# Patient Record
Sex: Male | Born: 1955
Health system: Southern US, Community
[De-identification: ages and names within clinical notes are randomized; demographics above are authoritative.]

## PROBLEM LIST (undated history)

## (undated) DIAGNOSIS — Z8719 Personal history of other diseases of the digestive system: Secondary | ICD-10-CM

## (undated) DIAGNOSIS — J449 Chronic obstructive pulmonary disease, unspecified: Secondary | ICD-10-CM

## (undated) DIAGNOSIS — K219 Gastro-esophageal reflux disease without esophagitis: Secondary | ICD-10-CM

## (undated) DIAGNOSIS — R519 Headache, unspecified: Secondary | ICD-10-CM

## (undated) DIAGNOSIS — I10 Essential (primary) hypertension: Secondary | ICD-10-CM

## (undated) DIAGNOSIS — Z86718 Personal history of other venous thrombosis and embolism: Secondary | ICD-10-CM

## (undated) DIAGNOSIS — R609 Edema, unspecified: Secondary | ICD-10-CM

## (undated) DIAGNOSIS — J90 Pleural effusion, not elsewhere classified: Secondary | ICD-10-CM

## (undated) DIAGNOSIS — L281 Prurigo nodularis: Secondary | ICD-10-CM

## (undated) DIAGNOSIS — Z9289 Personal history of other medical treatment: Secondary | ICD-10-CM

## (undated) DIAGNOSIS — M1990 Unspecified osteoarthritis, unspecified site: Secondary | ICD-10-CM

## (undated) DIAGNOSIS — K922 Gastrointestinal hemorrhage, unspecified: Secondary | ICD-10-CM

## (undated) DIAGNOSIS — M2550 Pain in unspecified joint: Secondary | ICD-10-CM

## (undated) DIAGNOSIS — F32A Depression, unspecified: Secondary | ICD-10-CM

## (undated) DIAGNOSIS — E039 Hypothyroidism, unspecified: Secondary | ICD-10-CM

## (undated) DIAGNOSIS — I639 Cerebral infarction, unspecified: Secondary | ICD-10-CM

## (undated) DIAGNOSIS — D649 Anemia, unspecified: Secondary | ICD-10-CM

## (undated) DIAGNOSIS — IMO0001 Reserved for inherently not codable concepts without codable children: Secondary | ICD-10-CM

## (undated) DIAGNOSIS — R350 Frequency of micturition: Secondary | ICD-10-CM

## (undated) DIAGNOSIS — Z8711 Personal history of peptic ulcer disease: Secondary | ICD-10-CM

## (undated) DIAGNOSIS — I341 Nonrheumatic mitral (valve) prolapse: Secondary | ICD-10-CM

## (undated) DIAGNOSIS — E785 Hyperlipidemia, unspecified: Secondary | ICD-10-CM

## (undated) DIAGNOSIS — F329 Major depressive disorder, single episode, unspecified: Secondary | ICD-10-CM

## (undated) DIAGNOSIS — E119 Type 2 diabetes mellitus without complications: Secondary | ICD-10-CM

## (undated) DIAGNOSIS — R42 Dizziness and giddiness: Secondary | ICD-10-CM

## (undated) DIAGNOSIS — R3915 Urgency of urination: Secondary | ICD-10-CM

## (undated) DIAGNOSIS — I6529 Occlusion and stenosis of unspecified carotid artery: Secondary | ICD-10-CM

## (undated) DIAGNOSIS — M62838 Other muscle spasm: Secondary | ICD-10-CM

## (undated) DIAGNOSIS — G4700 Insomnia, unspecified: Secondary | ICD-10-CM

## (undated) DIAGNOSIS — R51 Headache: Secondary | ICD-10-CM

## (undated) DIAGNOSIS — G629 Polyneuropathy, unspecified: Secondary | ICD-10-CM

## (undated) DIAGNOSIS — R600 Localized edema: Secondary | ICD-10-CM

## (undated) DIAGNOSIS — B182 Chronic viral hepatitis C: Secondary | ICD-10-CM

## (undated) HISTORY — DX: Chronic viral hepatitis C: B18.2

## (undated) HISTORY — DX: Major depressive disorder, single episode, unspecified: F32.9

## (undated) HISTORY — DX: Gastro-esophageal reflux disease without esophagitis: K21.9

## (undated) HISTORY — DX: Occlusion and stenosis of unspecified carotid artery: I65.29

## (undated) HISTORY — PX: VASCULAR SURGERY: SHX849

## (undated) HISTORY — PX: OTHER SURGICAL HISTORY: SHX169

## (undated) HISTORY — DX: Type 2 diabetes mellitus without complications: E11.9

## (undated) HISTORY — DX: Chronic obstructive pulmonary disease, unspecified: J44.9

## (undated) HISTORY — DX: Hypothyroidism, unspecified: E03.9

## (undated) HISTORY — DX: Essential (primary) hypertension: I10

## (undated) HISTORY — DX: Depression, unspecified: F32.A

## (undated) HISTORY — DX: Prurigo nodularis: L28.1

## (undated) HISTORY — DX: Hyperlipidemia, unspecified: E78.5

---

## 1998-08-07 HISTORY — PX: OTHER SURGICAL HISTORY: SHX169

## 2006-09-20 ENCOUNTER — Ambulatory Visit (HOSPITAL_COMMUNITY): Admission: RE | Admit: 2006-09-20 | Discharge: 2006-09-20 | Payer: Self-pay | Admitting: Orthopedic Surgery

## 2006-10-02 ENCOUNTER — Ambulatory Visit: Payer: Self-pay | Admitting: Internal Medicine

## 2006-10-02 ENCOUNTER — Ambulatory Visit (HOSPITAL_COMMUNITY): Admission: RE | Admit: 2006-10-02 | Discharge: 2006-10-02 | Payer: Self-pay | Admitting: Orthopedic Surgery

## 2006-10-03 ENCOUNTER — Ambulatory Visit (HOSPITAL_COMMUNITY): Admission: RE | Admit: 2006-10-03 | Discharge: 2006-10-03 | Payer: Self-pay | Admitting: Orthopedic Surgery

## 2006-10-08 ENCOUNTER — Encounter: Admission: RE | Admit: 2006-10-08 | Discharge: 2006-10-08 | Payer: Self-pay | Admitting: Orthopedic Surgery

## 2006-10-11 ENCOUNTER — Ambulatory Visit (HOSPITAL_COMMUNITY): Admission: RE | Admit: 2006-10-11 | Discharge: 2006-10-11 | Payer: Self-pay | Admitting: Orthopedic Surgery

## 2006-10-15 ENCOUNTER — Ambulatory Visit (HOSPITAL_COMMUNITY): Admission: RE | Admit: 2006-10-15 | Discharge: 2006-10-15 | Payer: Self-pay | Admitting: Orthopedic Surgery

## 2006-10-18 ENCOUNTER — Ambulatory Visit (HOSPITAL_COMMUNITY): Admission: RE | Admit: 2006-10-18 | Discharge: 2006-10-18 | Payer: Self-pay | Admitting: Orthopedic Surgery

## 2006-10-22 ENCOUNTER — Ambulatory Visit (HOSPITAL_COMMUNITY): Admission: RE | Admit: 2006-10-22 | Discharge: 2006-10-22 | Payer: Self-pay | Admitting: Orthopedic Surgery

## 2007-08-08 HISTORY — PX: ROTATOR CUFF REPAIR: SHX139

## 2009-01-23 ENCOUNTER — Emergency Department (HOSPITAL_COMMUNITY): Admission: EM | Admit: 2009-01-23 | Discharge: 2009-01-23 | Payer: Self-pay | Admitting: Emergency Medicine

## 2009-03-07 HISTORY — PX: INCISION AND DRAINAGE PERIRECTAL ABSCESS: SHX1804

## 2009-03-25 ENCOUNTER — Emergency Department (HOSPITAL_COMMUNITY): Admission: EM | Admit: 2009-03-25 | Discharge: 2009-03-25 | Payer: Self-pay | Admitting: Emergency Medicine

## 2009-03-28 ENCOUNTER — Emergency Department (HOSPITAL_COMMUNITY): Admission: EM | Admit: 2009-03-28 | Discharge: 2009-03-28 | Payer: Self-pay | Admitting: Emergency Medicine

## 2009-07-16 ENCOUNTER — Emergency Department (HOSPITAL_COMMUNITY): Admission: EM | Admit: 2009-07-16 | Discharge: 2009-07-16 | Payer: Self-pay | Admitting: Emergency Medicine

## 2009-08-10 ENCOUNTER — Emergency Department (HOSPITAL_COMMUNITY): Admission: EM | Admit: 2009-08-10 | Discharge: 2009-08-10 | Payer: Self-pay | Admitting: Emergency Medicine

## 2010-03-17 ENCOUNTER — Ambulatory Visit: Payer: Self-pay | Admitting: Internal Medicine

## 2010-03-17 ENCOUNTER — Encounter: Payer: Self-pay | Admitting: Internal Medicine

## 2010-03-17 DIAGNOSIS — K219 Gastro-esophageal reflux disease without esophagitis: Secondary | ICD-10-CM | POA: Insufficient documentation

## 2010-03-17 DIAGNOSIS — L0201 Cutaneous abscess of face: Secondary | ICD-10-CM | POA: Insufficient documentation

## 2010-03-17 DIAGNOSIS — E1165 Type 2 diabetes mellitus with hyperglycemia: Secondary | ICD-10-CM

## 2010-03-17 DIAGNOSIS — IMO0001 Reserved for inherently not codable concepts without codable children: Secondary | ICD-10-CM | POA: Insufficient documentation

## 2010-03-17 DIAGNOSIS — J449 Chronic obstructive pulmonary disease, unspecified: Secondary | ICD-10-CM | POA: Insufficient documentation

## 2010-03-17 DIAGNOSIS — M549 Dorsalgia, unspecified: Secondary | ICD-10-CM | POA: Insufficient documentation

## 2010-03-17 DIAGNOSIS — L03211 Cellulitis of face: Secondary | ICD-10-CM

## 2010-03-18 LAB — CONVERTED CEMR LAB
ALT: 51 units/L (ref 0–53)
AST: 56 units/L — ABNORMAL HIGH (ref 0–37)
Albumin: 3.6 g/dL (ref 3.5–5.2)
Alkaline Phosphatase: 126 units/L — ABNORMAL HIGH (ref 39–117)
BUN: 12 mg/dL (ref 6–23)
Basophils Absolute: 0.1 10*3/uL (ref 0.0–0.1)
Basophils Relative: 1.3 % (ref 0.0–3.0)
Bilirubin Urine: NEGATIVE
Bilirubin, Direct: 0.3 mg/dL (ref 0.0–0.3)
CO2: 32 meq/L (ref 19–32)
Calcium: 8.9 mg/dL (ref 8.4–10.5)
Chloride: 99 meq/L (ref 96–112)
Cholesterol: 120 mg/dL (ref 0–200)
Creatinine, Ser: 0.8 mg/dL (ref 0.4–1.5)
Creatinine,U: 160.3 mg/dL
Direct LDL: 65.6 mg/dL
Eosinophils Absolute: 0.1 10*3/uL (ref 0.0–0.7)
Eosinophils Relative: 2.8 % (ref 0.0–5.0)
Folate: 9.2 ng/mL
GFR calc non Af Amer: 104.73 mL/min (ref >60)
Glucose, Bld: 165 mg/dL — ABNORMAL HIGH (ref 70–99)
HCT: 43.8 % (ref 39.0–52.0)
HDL: 26.3 mg/dL — ABNORMAL LOW (ref >39.00)
Hemoglobin, Urine: NEGATIVE
Hemoglobin: 15.5 g/dL (ref 13.0–17.0)
Hgb A1c MFr Bld: 9.2 % — ABNORMAL HIGH (ref 4.6–6.5)
Iron: 112 ug/dL (ref 42–165)
Ketones, ur: NEGATIVE mg/dL
Leukocytes, UA: NEGATIVE
Lymphocytes Relative: 37.4 % (ref 12.0–46.0)
Lymphs Abs: 1.9 10*3/uL (ref 0.7–4.0)
MCHC: 35.5 g/dL (ref 30.0–36.0)
MCV: 81.3 fL (ref 78.0–100.0)
Microalb Creat Ratio: 17.2 mg/g (ref 0.0–30.0)
Microalb, Ur: 27.6 mg/dL — ABNORMAL HIGH (ref 0.0–1.9)
Monocytes Absolute: 0.6 10*3/uL (ref 0.1–1.0)
Monocytes Relative: 12.4 % — ABNORMAL HIGH (ref 3.0–12.0)
Neutro Abs: 2.3 10*3/uL (ref 1.4–7.7)
Neutrophils Relative %: 46.1 % (ref 43.0–77.0)
Nitrite: NEGATIVE
PSA: 0.25 ng/mL (ref 0.10–4.00)
Platelets: 171 10*3/uL (ref 150.0–400.0)
Potassium: 4.7 meq/L (ref 3.5–5.1)
RBC: 5.38 M/uL (ref 4.22–5.81)
RDW: 14.1 % (ref 11.5–14.6)
Saturation Ratios: 26.6 % (ref 20.0–50.0)
Sed Rate: 42 mm/hr — ABNORMAL HIGH (ref 0–22)
Sodium: 139 meq/L (ref 135–145)
Specific Gravity, Urine: 1.02 (ref 1.000–1.030)
TSH: 5.4 microintl units/mL (ref 0.35–5.50)
Total Bilirubin: 0.8 mg/dL (ref 0.3–1.2)
Total CHOL/HDL Ratio: 5
Total Protein, Urine: 30 mg/dL
Total Protein: 7.5 g/dL (ref 6.0–8.3)
Transferrin: 300.8 mg/dL (ref 212.0–360.0)
Triglycerides: 203 mg/dL — ABNORMAL HIGH (ref 0.0–149.0)
Urine Glucose: NEGATIVE mg/dL
Urobilinogen, UA: 0.2 (ref 0.0–1.0)
VLDL: 40.6 mg/dL — ABNORMAL HIGH (ref 0.0–40.0)
Vit D, 25-Hydroxy: 31 ng/mL (ref 30–89)
Vitamin B-12: 259 pg/mL (ref 211–911)
WBC: 5.1 10*3/uL (ref 4.5–10.5)
pH: 5.5 (ref 5.0–8.0)

## 2010-03-22 ENCOUNTER — Telehealth: Payer: Self-pay | Admitting: Internal Medicine

## 2010-03-25 ENCOUNTER — Encounter: Payer: Self-pay | Admitting: Internal Medicine

## 2010-04-05 ENCOUNTER — Telehealth: Payer: Self-pay | Admitting: Internal Medicine

## 2010-04-08 ENCOUNTER — Telehealth (INDEPENDENT_AMBULATORY_CARE_PROVIDER_SITE_OTHER): Payer: Self-pay | Admitting: *Deleted

## 2010-09-06 NOTE — Progress Notes (Signed)
Summary: Referral  Phone Note Call from Patient Call back at Home Phone [phone]   Caller: Patient Summary of Call: Pt called requesting referral to Dermatology for rash/bumps on his face. Initial call taken by: Margaret Pyle, CMA,  April 05, 2010 9:14 AM  Follow-up for Phone Call        done per emr Follow-up by: Corwin Levins MD,  April 05, 2010 4:41 PM  Additional Follow-up for Phone Call Additional follow up Details #1::        Pt informed  Additional Follow-up by: Lamar Sprinkles, CMA,  April 05, 2010 5:17 PM  New Problems: FACIAL RASH (ICD-782.1)   New Problems: FACIAL RASH (ICD-782.1)

## 2010-09-06 NOTE — Assessment & Plan Note (Signed)
Summary: NEW PT PHY-MEDICARE-PKG-#-STC   Vital Signs:  Patient profile:   55 year old male Height:      71 inches Weight:      266.75 pounds BMI:     37.34 O2 Sat:      97 % on Room air Temp:     98 degrees F oral BP sitting:   132 / 70  (left arm) Cuff size:   large  Vitals Entered By: Zella Ball Ewing CMA (AAMA) (March 17, 2010 9:03 AM)  O2 Flow:  Room air  CC: New Patient , ADult Physical/RE/wellness   CC:  New Patient  and ADult Physical/RE/wellness.  History of Present Illness: here to establish as new pt - for 6 mo has had left facial recurring cellulitis with 2 course of antibx - now with 1 more wk of the augmentin to go after taking a friends prescription;  had fever to start with chills and pain , redness and sweling  - overall improved; also c/o dizziness and fatigue more recetly as well but sugar was 237 saturday when he checked with a friends machine;  Pt denies CP, sob, doe, wheezing, orthopnea, pnd, worsening LE edema, palps, dizziness or syncope  Pt denies new neuro symptoms such as headache, facial or extremity weakness  Drinks a lot of water but he is not sure he would "calssify" it as polydipsia.  Does also mention chronic cough some better with taking the augemntin as well.  No definite hx of MRSA  Here for wellness Diet: Heart Healthy or DM if diabetic Physical Activities: Sedentary Depression/mood screen: mild, declines treatment Hearing: Intact bilateral except left 50% Visual Acuity: Grossly normal, gets exam yearly, wears reading glasses ADL's: Capable though has 29% right shoudler function loss/disability   Fall Risk: None Home Safety: Good Cognitive Impairment:  Gen appearance, affect, speech, memory, attention & motor skills grossly intact End-of-Life Planning: Advance directive - Full code/I agree   Preventive Screening-Counseling & Management  Alcohol-Tobacco     Smoking Status: current      Drug Use:  no.    Problems Prior to Update: 1)  Back  Pain, Chronic  (ICD-724.5) 2)  Ventral Hernia  (ICD-553.20) 3)  COPD  (ICD-496) 4)  Diabetes Mellitus, Type II  (ICD-250.00) 5)  Gerd  (ICD-530.81) 6)  Depression  (ICD-311)  Medications Prior to Update: 1)  None  Current Medications (verified): 1)  Onetouch Ultra Test  Strp (Glucose Blood) .... Use Asd 1 Qam 2)  Lancets  Misc (Lancets) .... Use Asd 1 Once Daily 3)  Aspir-Low 81 Mg Tbec (Aspirin) .Marland Kitchen.. 1 By Mouth Once Daily 4)  Augmentin 875-125 Mg Tabs (Amoxicillin-Pot Clavulanate) .Marland Kitchen.. 1 By Mouth Two Times A Day  Allergies (verified): No Known Drug Allergies  Past History:  Family History: Last updated: 03/17/2010 father with ETOH, lung cancer mother with heart disease, AICD, DM m-grandmother with lung cancer sister with lung cancer   Social History: Last updated: 03/17/2010 Divorced iwth fiancee disabled since juuen 2010 - right shoulder no children Current Smoker - 1 - 2 ppd for 40 yrs Alcohol use-yes - social Drug use-no  Risk Factors: Smoking Status: current (03/17/2010)  Past Medical History: Depression GERD - diet  Diabetes mellitus, type II COPD ventral hernia chronic recurrent lower back pain  MD Roster:  none  Past Surgical History: neg stress test 2000 Rotator cuff repair - very complicated requiring mult f/u surgury/infection tx  - 2009 s/p perirectal abscess aug 2010  Family History: Reviewed  history and no changes required. father with ETOH, lung cancer mother with heart disease, AICD, DM m-grandmother with lung cancer sister with lung cancer   Social History: Reviewed history and no changes required. Divorced iwth fiancee disabled since juuen 2010 - right shoulder no children Current Smoker - 1 - 2 ppd for 40 yrs Alcohol use-yes - social Drug use-no Smoking Status:  current Drug Use:  no  Review of Systems  The patient denies anorexia, fever, weight gain, vision loss, decreased hearing, hoarseness, chest pain, syncope, dyspnea  on exertion, peripheral edema, prolonged cough, headaches, hemoptysis, abdominal pain, melena, hematochezia, severe indigestion/heartburn, hematuria, muscle weakness, suspicious skin lesions, transient blindness, difficulty walking, depression, unusual weight change, abnormal bleeding, enlarged lymph nodes, and angioedema.         all otherwise negative per pt -    Physical Exam  General:  alert and overweight-appearing.   Head:  normocephalic and atraumatic.   Eyes:  vision grossly intact, pupils equal, and pupils round.   Ears:  R ear normal and L ear normal.   Nose:  no external deformity and no nasal discharge.   Mouth:  no gingival abnormalities and pharynx pink and moist.   Neck:  supple and no masses.   Lungs:  normal respiratory effort, R decreased breath sounds, and L decreased breath sounds.   Heart:  normal rate and regular rhythm.   Abdomen:  soft, non-tender, and normal bowel sounds.   Msk:  no joint tenderness and no joint swelling.   Extremities:  no edema, no erythema  Neurologic:  cranial nerves II-XII intact and strength normal in all extremities.   Skin:  color normal and no rashes.  , except for 3 small subq nodular type areas of infection/cellultis without fluctuance to left face and submandib LA noted mild tender Psych:  not depressed appearing and moderately anxious.     Impression & Recommendations:  Problem # 1:  Preventive Health Care (ICD-V70.0)  Overall doing well, age appropriate education and counseling updated and referral for appropriate preventive services done unless declined, immunizations up to date or declined, diet counseling done if overweight, urged to quit smoking if smokes , most recent labs reviewed and current ordered if appropriate, ecg reviewed or declined (interpretation per ECG scanned in the EMR if done); information regarding Medicare Prevention requirements given if appropriate; speciality referrals updated as appropriate   Orders: EKG  w/ Interpretation (93000) Gastroenterology Referral (GI) Medicare -1st Annual Wellness Visit [phone])  Problem # 2:  DIABETES MELLITUS, TYPE II (ICD-250.00)  Orders: TLB-Lipid Panel (80061-LIPID) TLB-Microalbumin/Creat Ratio, Urine (82043-MALB) TLB-A1C / Hgb A1C (Glycohemoglobin) (83036-A1C)  His updated medication list for this problem includes:    Aspir-low 81 Mg Tbec (Aspirin) .Marland Kitchen... 1 by mouth once daily  Problem # 3:  FATIGUE (ICD-780.79)  exam o/w benign, to check labs below; follow with expectant management   Orders: TLB-BMP (Basic Metabolic Panel-BMET) (80048-METABOL) TLB-CBC Platelet - w/Differential (85025-CBCD) TLB-Hepatic/Liver Function Pnl (80076-HEPATIC) TLB-TSH (Thyroid Stimulating Hormone) (84443-TSH) TLB-Sedimentation Rate (ESR) (85652-ESR) TLB-IBC Pnl (Iron/FE;Transferrin) (83550-IBC) TLB-B12 + Folate Pnl (82746_82607-B12/FOL) TLB-Udip ONLY (81003-UDIP) T-Vitamin D (25-Hydroxy) (60454-09811)  Problem # 4:  COPD (ICD-496)  with recurring cough - stable overall by hx and exam, ok to continue meds/tx as is  (none) but will check cxr, and eventually PFT's  Orders: T-2 View CXR, Same Day (71020.5TC)  Problem # 5:  DEPRESSION (ICD-311) mild to mod , situaional but prob chronic., declines tx at this time  Problem # 6:  CELLULITIS AND ABSCESS OF FACE (ICD-682.0)  improving, ok to finish his augementin course  His updated medication list for this problem includes:    Augmentin 875-125 Mg Tabs (Amoxicillin-pot clavulanate) .Marland Kitchen... 1 by mouth two times a day  Complete Medication List: 1)  Onetouch Ultra Test Strp (Glucose blood) .... Use asd 1 qam 2)  Lancets Misc (Lancets) .... Use asd 1 once daily 3)  Aspir-low 81 Mg Tbec (Aspirin) .Marland Kitchen.. 1 by mouth once daily 4)  Augmentin 875-125 Mg Tabs (Amoxicillin-pot clavulanate) .Marland Kitchen.. 1 by mouth two times a day  Other Orders: Tdap => 68yrs IM ([address]) Admin 1st Vaccine ([address]454) TLB-PSA (Prostate Specific Antigen)  (84153-PSA)  Patient Instructions: 1)  you had the tetanus shot today 2)  Please go to the Lab in the basement for your blood and/or urine tests today 3)  Please go to Radiology in the basement level for your X-Ray today  4)  Please call the number on the [facility] for results of your testing  5)  You will be contacted about the referral(s) to: colonscopy 6)  you had the pneumonia shot today 7)  please check your sugars once per day in the am before breakfast 8)  Take an Aspirin every day - 81 mg - 1 per day - COATED only 9)  Continue all previous medications as before this visit  10)  Please schedule a follow-up appointment in 3 months  Prescriptions: LANCETS  MISC (LANCETS) use asd 1 once daily  #100 x 11   Entered and Authorized by:   Corwin Levins MD   Signed by:   Corwin Levins MD on 03/17/2010   Method used:   Print then Give to Patient   RxID:   [id number] [facility] ULTRA TEST  STRP (GLUCOSE BLOOD) use asd 1 qam  #100 x 11   Entered and Authorized by:   Corwin Levins MD   Signed by:   Corwin Levins MD on 03/17/2010   Method used:   Print then Give to Patient   RxID:   [id number]    Immunizations Administered:  Tetanus Vaccine:    Vaccine Type: Tdap    Site: left deltoid    Mfr: GlaxoSmithKline    Dose: 0.5 ml    Route: IM    Given by: Zella Ball Ewing CMA (AAMA)    Exp. Date: 02/04/2012    Lot #: [id number]    VIS given: 06/25/07 version given March 17, 2010.   Appended Document: NEW PT PHY-MEDICARE-PKG-#-STC       Immunizations Administered:  Pneumonia Vaccine:    Vaccine Type: Pneumovax    Site: right deltoid    Mfr: Merck    Dose: 0.5 ml    Route: IM    Given by: Zella Ball Ewing CMA (AAMA)    Exp. Date: 08/28/2011    Lot #: [id number]    VIS given: 03/04/96 version given March 17, 2010.

## 2010-09-06 NOTE — Progress Notes (Signed)
Summary: Supporting lab dx code  ---- Converted from flag ---- ---- 04/05/2010 6:25 PM, Corwin Levins MD wrote: sorry - 268.9  ---- 04/05/2010 11:21 AM, Leodis Liverpool wrote: Could you send a correct diagnosis code for Vit D, they won't accept 780.79. ------------------------------

## 2010-09-06 NOTE — Letter (Signed)
Summary: Referral - not able to see patient  [facility]  [address] [place], [place] [address]   Phone: [phone]  Fax: [phone]    March 25, 2010    Corwin Levins, M.D. [address] [place], [place] [address]    Re:   Brad [name] DOB:  February 18, 1956 MRN:   [id number]    Dear Dr. Jonny Ruiz:  Thank you for your kind referral of the above patient.  We have attempted to schedule the recommended procedure Screening Colonoscopy but have not been able to schedule because:  ___ The patient was not available by phone and/or has not returned our calls.   X  The patient declined to schedule the procedure at this time.  We appreciate the referral and hope that we will have the opportunity to treat this patient in the future.    Sincerely,    [facility] [phone]

## 2010-09-06 NOTE — Progress Notes (Signed)
Summary: Cellulitis  Phone Note Call from Patient Call back at Home Phone [phone]   Caller: Patient Summary of Call: Pt called stating that Augmentin for Cellulitis of the face had helped but he is currently out. Pt states that he was told by JWJ to call back if Augmentin helped and another ABX would be called into pharmacy. [facility] Initial call taken by: Margaret Pyle, CMA,  March 22, 2010 11:35 AM  Follow-up for Phone Call        should be ok to watch for now, as long as no fever, increased redness, pain or swelling Follow-up by: Corwin Levins MD,  March 22, 2010 12:27 PM  Additional Follow-up for Phone Call Additional follow up Details #1::        Pt informed and agreed to monitor symptoms Additional Follow-up by: Margaret Pyle, CMA,  March 22, 2010 1:18 PM

## 2010-11-12 LAB — DIFFERENTIAL
Basophils Absolute: 0 10*3/uL (ref 0.0–0.1)
Basophils Relative: 1 % (ref 0–1)
Eosinophils Absolute: 0.1 10*3/uL (ref 0.0–0.7)
Eosinophils Relative: 2 % (ref 0–5)
Lymphocytes Relative: 38 % (ref 12–46)
Lymphs Abs: 1.4 10*3/uL (ref 0.7–4.0)
Monocytes Absolute: 0.5 10*3/uL (ref 0.1–1.0)
Monocytes Relative: 13 % — ABNORMAL HIGH (ref 3–12)
Neutro Abs: 1.7 10*3/uL (ref 1.7–7.7)
Neutrophils Relative %: 47 % (ref 43–77)

## 2010-11-12 LAB — CBC
HCT: 40.9 % (ref 39.0–52.0)
Hemoglobin: 14.5 g/dL (ref 13.0–17.0)
MCHC: 35.3 g/dL (ref 30.0–36.0)
MCV: 80.9 fL (ref 78.0–100.0)
Platelets: 225 10*3/uL (ref 150–400)
RBC: 5.06 MIL/uL (ref 4.22–5.81)
RDW: 13.3 % (ref 11.5–15.5)
WBC: 3.7 10*3/uL — ABNORMAL LOW (ref 4.0–10.5)

## 2010-11-12 LAB — BASIC METABOLIC PANEL
BUN: 8 mg/dL (ref 6–23)
CO2: 29 mEq/L (ref 19–32)
Calcium: 9.1 mg/dL (ref 8.4–10.5)
Chloride: 98 mEq/L (ref 96–112)
Creatinine, Ser: 0.83 mg/dL (ref 0.4–1.5)
GFR calc Af Amer: >60 mL/min (ref >60)
GFR calc non Af Amer: >60 mL/min (ref >60)
Glucose, Bld: 310 mg/dL — ABNORMAL HIGH (ref 70–99)
Potassium: 4.3 mEq/L (ref 3.5–5.1)
Sodium: 133 mEq/L — ABNORMAL LOW (ref 135–145)

## 2010-12-05 ENCOUNTER — Other Ambulatory Visit: Payer: Self-pay

## 2010-12-05 MED ORDER — GLUCOSE BLOOD VI STRP: ORAL_STRIP | Status: AC

## 2010-12-05 MED ORDER — ACCU-CHEK AVIVA PLUS W/DEVICE KIT: 1.0000 | PACK | Status: DC

## 2010-12-23 NOTE — Op Note (Signed)
Brad [name], Brad [name]             ACCOUNT NO.:  [id number]   MEDICAL RECORD NO.:  [id number]          PATIENT TYPE:  AMB   LOCATION:  SDS                          FACILITY:  [facility]   PHYSICIAN:  Doralee Albino. Carola Frost, M.D. DATE OF BIRTH:  1955-08-13   DATE OF PROCEDURE:  10/02/2006  DATE OF DISCHARGE:                               OPERATIVE REPORT   PREOPERATIVE DIAGNOSIS:  Right shoulder wound infection.   POSTOPERATIVE DIAGNOSIS:  1. Right shoulder wound infection.  2. Partial disruption of rotator cuff repair.   PROCEDURE:  1. Irrigation and debridement of right shoulder wound infection with      debridement of skin, subcutaneous tissue, and also of the      glenohumeral joint.  2. Revision repair of the infraspinatus and rotator cuff interval,      right shoulder.   SURGEON:  Doralee Albino. Carola Frost, M.D.   ASSISTANT:  None.   ANESTHESIA:  General.   COMPLICATIONS:  None.   DRAINS:  One medium Hemovac.   SPECIMENS:  Two to micro for anaerobic and aerobic culture, stat gram  stain was negative for organisms.   DISPOSITION:  To the PACU.   CONDITION:  Stable.   BRIEF SUMMARY OF INDICATIONS FOR PROCEDURE:  Brad [name] is a right  hand dominant 55 year old male who sustained a rotator cuff injury  sometime ago that underwent recent surgical repair.  At around 7-10  days, he was found to have a wound infection with drainage.  At that  time, he was seen and evaluated in the clinic.  I recommended immediate  irrigation, debridement, and evaluation of the cuff repair to prevent  further damage and possible loss of the repair.  This was explained to  both the patient and his fiancee.  I also recommended an MRI as a suture  anchor had been caught in the soft tissues and we were unable to locate  it at the previous surgery as it was radiolucent.  He refused the MRI  and initially refused surgery.  They subsequently contacted me and  indicated that they were willing to proceed  with surgery as long as it  could be done as an outpatient.  The patient's wife developed MRSA  pneumonia during her stay in the hospital and they were very concerned  regarding the possibility of nosocomial infection on top of his other  issues.  I did discuss with them the risk of failure to obtain  resolution of the infection, need for subsequent surgery, the  possibility of cuff re-rupture and need for repeat repair, as well as  DVT, PE, nerve injury, vessel injury, and other concerns.  After a full  discussion, they wished to proceed.   BRIEF DESCRIPTION OF PROCEDURE:  Brad [name] was taken back to the  operating room, antibiotics were held preoperatively, but the patient  had been on Duricef.  His old incision was reopened and he had a  significant quantity of fluid which was dishwater type, not completely  purulent but having the appearance of infection.  This was evacuated.  The Vicryl sutures, which had been placed in  the deltoid, had pulled  through. The infraspinatus repair as well as that involving the rotator  cuff interval was completely disrupted, as well, and the infraspinatus  had retracted exposing the humeral head.  The incision was extended to  allow for complete evacuation and lavage of the tissues.  We used 1200  mL of low pressure irrigation and then began repeat repair of the  rotator cuff in order to close the joint.   FiberWire sutures were used only temporarily to retract or mobilize the  rotator cuff and then monofilament Prolene sutures were used to repair  it using figure-of-eight technique for the interval and ONE[employer] and  imbrication technique for the posterior cuff along the infraspinatus.  We were able to obtain a nice closure with this but we did hold the arm  in some abduction during portions of this repair in order to relax the  tissues and not stress the stump of material that was used to repair to  as we did not wish to place any sutures in the  environment of infection  nor did we wish to drill any bone holes into the proximal humerus at  this time.  The supraspinatus repair remained in excellent condition and  well fixed.  Following the repair, we used another 500 mL of irrigation  at this time with some antibiotics in the solution.  We then performed a  layered closure with absorbable monofilament after placement of a deep  drain, 0 PDS was used for the deltoid, 2-0 PDS for the subcu, and 2-0  nylon for the skin.  These were widely placed, again.  A sterile gently  compressive dressing was applied as well as an abduction type sling to  keep the shoulder in slight abduction and neutral rotation.   PROGNOSIS:  Postoperatively, Brad [name] will be on vancomycin which he  did receive immediately after obtaining the cultures intraoperatively.  I will also put him on Cipro.  I have asked Dr. Cliffton Asters from  infectious disease to see him.  I did consult with Dr. Bethann Punches  regarding the best operative course to take with regard to irrigation  and debridement of this shoulder as well as the decision to close the  wound over a drain and to repair the cuff with monofilament.  If the  infection can be controlled, he should go on to heal the cuff and have a  good result.  Clearly, retained foreign material including the suture is  a risk for persistent infection.  We will work with Dr. Orvan Falconer on this  as well as the retained suture anchor previously discussed.  The patient  is aware of all these factors as is his fiance.  Home health will see  him in the recovery room to establish proper dosing and protocol while  he has an anticipated course of vancomycin IV for six weeks as well as  the Cipro 750.  He will be on MS Contin 30 mg q.12h. for pain control  with Percocet for break through.  I will plan to see him back for early  office follow-up in 48-72 hours to reassess his wound.  At this time, he will be doing simple mobilization  in the sling.  We hope to begin some  gentle pendulum on his return to clinic if his wound appears healthy.      Doralee Albino. Carola Frost, M.D.  Electronically Signed     [facility]/MEDQ  D:  10/02/2006  T:  10/02/2006  Job:  [id number]

## 2010-12-23 NOTE — Op Note (Signed)
NAMEPASQUALINO, WITHERSPOON             ACCOUNT NO.:  [id number]   MEDICAL RECORD NO.:  [id number]          PATIENT TYPE:  AMB   LOCATION:  SDS                          FACILITY:  [facility]   PHYSICIAN:  Doralee Albino. Carola Frost, M.D. DATE OF BIRTH:  [birth year]   DATE OF PROCEDURE:  09/20/2006  DATE OF DISCHARGE:                               OPERATIVE REPORT   PREOPERATIVE DIAGNOSES:  1. Right subacromial impingement.  2. Acromioclavicular arthrosis.  3. Full-thickness rotator cuff tear.  4. Extensive synovitis.  5. Chronic ruptured biceps tendon.   POSTOPERATIVE DIAGNOSES:  1. Right subacromial impingement.  2. Acromioclavicular arthrosis.  3. Full-thickness rotator cuff tear.  4. Extensive synovitis.  5. Chronic ruptured biceps tendon.   OPERATION/PROCEDURE:  1. Extensivie arthroscopic debridement shoulder joint.  2. A mini open rotator cuff repair.  3. Open distal clavicle excision.  4. Open subacromial decompression.   SURGEON:  Doralee Albino. Carola Frost, M.D.   ASSISTANT:  None.   ANESTHESIA:  General.   SPECIMENS:  None.   ESTIMATED BLOOD LOSS:  100 mL.   COMPLICATIONS:  One retained extraosseous Arthrex suture anchor.   DISPOSITION:  PACU.   CONDITION:  Stable.   INDICATIONS:  Kail Fraley is a 55 year old male with a longstanding  history of right shoulder subacromial impingement, AC joint arthritis,  complete rotator cuff tear who sustained injury at work.  He has  undergone thorough evaluation and nonoperative management with failure,  and now presents for surgical decompression and rotator cuff repair.  We  discussed specifically the [facility] joint arthritis and MRI and plain film  confirm severe degenerative changes there with subacromial impingement.  He understood the risks and benefits of surgery including possibility of  rerupture, need for further surgery, infection, nerve injury, vessel  injury, heart attack, stroke, lung complications given his smoking  history and  others.  He wished to proceed.   DESCRIPTION OF PROCEDURE:  Mr. Litzau was taken to the operating room  after administration of a regional block.  He was evaluated carefully by  Dr. Ezequiel Essex from anesthesia preoperatively who felt that he would  benefit from this, particularly given the patient's strong desire to go  home postoperatively.  He was then positioned carefully on the shoulder  table after induction of general anesthesia and the right upper  extremity prepped and draped in the usual sterile fashion.  A posterior  portal was made, then the anterior and working lateral portals without  significant difficulty.  Upon entry into the joint, the patient had an  enormous amount of synovitis.  He had a chronic biceps tendon tear from  the labrum and the attachment was completely absent.  The synovitis was  so severe that the synovium could be caught in the glenohumeral joint.  The articular surface did not appear significantly degenerative.  We  were then able to look far anteriorly to see the large tear in the cuff.  The subscapularis was intact but did not appear entirely healthy.  We  then performed a debridement of the synovitis superiorly and the cuff  tear.  This was to reduce the raggedness  of the edges.  We did look in  the inferior recess.  Did not see any loose bodies or other areas of  concern.  We then withdrew the scope from the joint and placed it into  the subacromial space.  We established the working portal at that time  and used the ArthroCare wand to remove the bursa carefully without  entering the cuff as well as to expose the bone.  He did have very  prominent osteophytes off his anterolateral acromion, even more severe  changes adjacent to his AC joint.  These again protruded very far down  and almost made contact with the cuff even in the upright position when  relaxed.  We then grasped the edge of the rotator cuff tear and were  able to mobilize this sufficiently  over to the insertion site on the  greater tuberosity.  The greater tuberosity site was then prepared by  rasping and using the drill to get to bleeding surface without violating  the subchondral bone to enable good purchase with our suture anchors.  Both of these were drilled and the anchors placed without difficulty.  Then we began to pass our sutures.  As we had spent an extensive period  of time in the patient's joint performing the extensive debridement, I  was concerned about the size of the shoulder and felt at that point that  conversion to a mini open would allow for repair in a more timely  fashion for the patient.  I did not want to limit the number of sutures  secondary to maintaining a completely arthroscopic procedure.  We then  extended the working portal proximally and distally, having  approximately a 3.5 cm incision.  We were able to clearly see the cuff,  the abraded area, and the suture anchors.  We passed four suture anchors  using imbrication technique and tied the cuff down in mattress fashion  directly over the anchor in the abraded area of the cuff.  We then  drilled and began to place two so-called taper lock devices to further  appose the cuff to the abraded bone.  The initial one did not seem to  pass easily and I consequently turned our attention to the more anterior  one.  This locking device was completely sunk into the bone without  difficulty resulting in good tension and apposition of the cuff.  We  then returned to the posterior one where once more the tamp was placed  in the hole and slid in without difficulty.  We then grabbed the locking  device and attempted to push it into the hole.  It did not go in and we  felt that perhaps we should reduce the number of sutures through the  lock or determine if there was an alignment problem.  We then simply  probed with this device into the hole to make sure that we could seat it into the hole, when it went into  the tissues adjacent to the hole.  Because it was a threaded device, it was withdrawn without twisting.  However, the islet as well as the anchor detached.  These were felt to  be immediately in the soft tissues and exploration was begun.   The device could not be located immediately in the adjacent soft  tissues.  We were concerned that perhaps it had fallen deep into the  joint and disengaged within the capsule.  The scope was advanced into  the joint again and another  arthroscopic examination performed.  We did  not see it from the posterior portal.  We then placed a scope into the  anterior portal and looked in the back.  We also looked into the  inferior recess.  We did not see it.  We palpated numerous times the  posterior cuff insertion where the device had disengaged.  We were  unable to palpate it clearly.  We then developed the plane along the  infraspinatus to look directly under the cuff.  We also extended our  incisions proximally and distally, being careful not to go too distal to  reduce the risk of injury to the axillary nerve.  Our visualization was  actually excellent, but in spite of this we were unable to identify this  material.  We then contacted x-ray even though this device was  radiolucent in case it cast a shadow or other discernible artifact.  During this time we did go ahead and proceed with an open subacromial  decompression and did a very meticulous subperiosteal dissection along  the [facility] joint in order to expose the distal clavicle and excise the  distal one centimeter.  It was again extraordinary in terms of the  amount of osteophytes on the undersurface.  We did protect the cuff  underneath with the broad Cobb during this excision and we were certain  to visualize adequate decompression posteriorly.  We took off the  anterolateral acromion as well as partial thickness of the undersurface  of the acromion.  This resulted in a wide open space which was  further  reduced by rasping.  This area was copiously irrigated to remove bone  dust and other debris.  This further improved our visualization of the  cuff below as well as the arthrotomy that we extended along the  posterior cuff interval in order to clearly established that this device  was not in the joint.  C-arm images did not show even the shadow of the  device despite several attempts.  We once more copiously irrigated.  We  were unable to identify the device and at that time I decided that  further dissection will be deleterious as we could with a reasonable  degree of certainty assure that it was not within the joint.  The wounds  and the joint were copiously irrigated and then meticulous closure  performed with repair of the a rotator cuff interval with 2-0 FiberWire  as well as the attachment of the infraspinatus.  The deltoid likewise  was reapproximated with figure-of-eight #1 Vicryl.  A 2-0 Vicryl was used for the shallow subcu and a running Prolene for the skin.  Steri-  Strips and sterile, gently compressive dressing were applied.  The  patient was awakened from anesthesia and transported PACU in stable  condition after placement of a sling.   PROGNOSIS:  Mr. Bogden should do well following his subacromial  decompression and distal clavicle incision as these were clearly  resulting in significant derangement of his right shoulder and produced  a rotator cuff tear.  He will be on a rotator cuff repair protocol which  will consist of pendulum only and then passive range of motion for the  first 6 weeks with active assisted thereafter.  With regard to this  extraosseous lock, it is inert and we are hopeful will not cause any  problems.  Should it do so, certainly an MRI scan will be the only way  to determine its whereabouts.  This has been clearly  explained to Mr.  Garcilazo fiancee already and will also be explained in detail to Mr.  Doane himself as he arouses more  fully from anesthesia.      Doralee Albino. Carola Frost, M.D.  Electronically Signed     MHH/MEDQ  D:  09/20/2006  T:  09/21/2006  Job:  [id number]

## 2010-12-23 NOTE — Op Note (Signed)
[name], [name]             ACCOUNT NO.:  [id number]   MEDICAL RECORD NO.:  [id number]          PATIENT TYPE:  AMB   LOCATION:  SDS                          FACILITY:  [facility]   PHYSICIAN:  Doralee Albino. Carola Frost, M.D. DATE OF BIRTH:  Oct 04, 1955   DATE OF PROCEDURE:  10/15/2006  DATE OF DISCHARGE:  10/15/2006                               OPERATIVE REPORT   PREOPERATIVE DIAGNOSIS:  Right shoulder infection, status post revision  and repair of rotator cuff x2.   POSTOPERATIVE DIAGNOSES:  1. Right shoulder infections.  2. Irreparable posterior rotator cuff disruption.  3. Retained foreign body, soft tissues, right shoulder   PROCEDURE:  1. Irrigation and debridement of right shoulder joint including      subcutaneous tissue, muscle and fascia.  2. Exploration of posterior tissues of the a rotator cuff.  3. Application of wound V.A.C.   SURGEON:  Doralee Albino. Carola Frost, M.D.   ASSISTANT:  None.   ANESTHESIA:  General, Janetta Hora. Gelene Mink, M.D.   SPECIMENS:  Three anaerobic, aerobic and tissue all sent to micro.  Gram  stain all negative for organisms.   DRAINS:  One.   ESTIMATED BLOOD LOSS:  Minimal.   DISPOSITION:  PACU.   CONDITION:  Stable.   BRIEF SUMMARY AND INDICATIONS FOR PROCEDURE:  Brad [name] is a 55-  year-old male who has now been to the OR multiple times for treatment of  right shoulder injury.  He underwent rotator cuff repair, subacromial  decompression and distal clavicle excision, and during the surgery one  of the suture anchors dislodged into the soft tissues.  Exploration was  unsuccessful.  The patient was discharged to home.  He was noncompliant  with his activity and motion restrictions.  He presented subsequently  with drainage and this was felt to be infection.  Cultures obtained in  the office and in the OR and subsequent washout were negative for  infection.  The patient refused a preoperative MRI before the initial  debridement.  A drain was  removed on postop day #2.  He redeveloped  drainage and agreed to undergo a secondary irrigation and debridement  after an MRI which demonstrated the bone anchor.  We performed a second  incision and drainage and found that our revised repair had also  disrupted.  At that time, the cuff was repaired through bone tunnels  with the feeling that this was his last attempt at repair given the  attenuation in the tissues.  He was placed into an airplane splint and  instructed to maintain this device at all times. The patient had been  noncompliant after his initial washout and debridement, particularly  with use of the sling.  His fiancee has repeatedly expressed concerns  about his following of restrictions.  Over the repair we then applied a  wound V.A.C. to assist with control of the infection.  The capsule and  glenohumeral joint was closed prior to the application of the V.A.C.  The patient returns today for removal of this wound V.A.C. and  evaluation of the cuff repair.  We discussed preoperatively again the  possibility  of reapplying the V.A.C. or discontinuing it and the risk of  persistent infection,  specifically in light of retained foreign body,  the risk of joint arthritis, decreased motion, need for further surgery,  neurovascular injury, and others.  After full discussion he and his  fiancee wish to proceed.   DESCRIPTION OF PROCEDURE:  [name] was taken to the operating room  where general anesthesia was induced.  He did receive additional  preoperative antibiotics.  We then removed the wound V.A.C. and  performed a standard prep and drape.  At that initial removal, it  appeared that his repair was intact.  There was some mild evidence of  ongoing infection laterally, but there did not really seem to be any  purulence, rather just some demarcation of avascular tissues along the  greater tuberosity insertion site.  This area was then evaluated more  fully after standard  prep and drape of the upper extremity.  At that  time we could see that the anterior and superior portions of the rotator  cuff repair remained intact and in seemingly good shape.  However, once  more the posterior aspect of the cuff had been avulsed.  The Prolene  suture loop, worked into the bone tunnel, was visible but the cuff had  pulled through it.  We then performed a thorough irrigation and  debridement of the soft tissues and glenohumeral joint.  Following this,  we then turned our attention to the posterior tissues, about the  infraspinatus in particular, and going medial to the glenoid.  This  portion was performed very meticulously as we did not wish to injure the  neurovascular structures.  We very carefully explored for the retained  anchor as we felt that this again could be a contributor to persistent  infection.  We were unable to isolate this device again despite a  methodical and careful search.  We then mobilized the soft tissues to  separate out the deltoid layer which was brought together and repaired  with #0 PDS directly over the supraspinatus.  We then applied the wound  V.A.C. on the superficial soft tissues.  The patient was placed back  into a sling, awakened from anesthesia and taken to the PACU in stable  condition.   PROGNOSIS:  [name] has a difficult problem with regard to his  irreparable cuff tear and ongoing infection.  I am concerned about his  ability to rehab through this injury and, of course, our first concern  is making sure that he goes on to resolve the infection.  He remains  under the surveillance of the infectious disease service and is on Cipro  and vancomycin.  I remain quite concerned about his compliance as well.  The patient and his fiancee told me they had been instructed to  discontinue the Cipro by telephone call from the hospital.  Our office was not contacted regarding that and so he has not been receiving that  medication.   They also told preop personnel at the previous surgery that  I had authorized them to come for  same day labs on the morning of their  7:30 surgery, but there had been no communication from me with the  patient regarding this, as our staff knows same-day labs are not  available for first starts.  Similarly in the preoperative holding area,  the patient informed the anesthesia personnel that he had two cups of  coffee this morning, and that it should not be a  problem since he also  drank coffee before his previous surgery.  This pattern of behavior  pushes [place] toward a much more conservative rehabilitation course,  including use of the airplane splint and a delayed rehabilitation  protocol with frequent visits and more active surveillance.  I  anticipate return to the OR in another 3 days or so for removal of the  wound V.A.C. I am hopeful the patient will not develop a draining sinus.  Once he has a completely clean wound, he may need reconstruction of the  posterior cuff which would most assuredly require an allograft or  something similar to patch the uncovered space.  I anticipate referral  for this procedure to a tertiary center.  I will continue to work  diligently with the patient to try to resolve some of these issues with  compliance, as this will also be a significant factor after subsequent  repair and  reconstruction and could negatively impact our ability to obtain  successful consultation and assumption of care for this procedure, not  to mention the outcome.  The patient and his fiancee have remained quite  pleasant despite the circumstances and so I remain hopeful we can  improve in this area, as well.      Doralee Albino. Carola Frost, M.D.  Electronically Signed     MHH/MEDQ  D:  10/15/2006  T:  10/16/2006  Job:  [id number]

## 2010-12-23 NOTE — Op Note (Signed)
Brad [name], Brad [name]             ACCOUNT NO.:  [id number]   MEDICAL RECORD NO.:  [id number]          PATIENT TYPE:  AMB   LOCATION:  SDS                          FACILITY:  [facility]   PHYSICIAN:  Doralee Albino. Carola Frost, M.D. DATE OF BIRTH:  May 18, 1956   DATE OF PROCEDURE:  10/18/2006  DATE OF DISCHARGE:                               OPERATIVE REPORT   PREOPERATIVE DIAGNOSIS:  Right shoulder infection.   POSTOPERATIVE DIAGNOSIS:  Right shoulder infection.   PROCEDURE:  1. Repeat irrigation and debridement of right shoulder joint.  2. Application wound V.A.C.   SURGEON:  Doralee Albino. Carola Frost, M.D.   ASSISTANT:  None.   ANESTHESIA:  General.   SPECIMENS:  Two, both aerobic and anaerobic cultures, Gram stain  positive for polys, no organism was identified.   ESTIMATED BLOOD LOSS:  Less than 100 mL.   COMPLICATIONS:  None.   DISPOSITION:  To PACU.   CONDITION:  Stable.   BRIEF SUMMARY OF INDICATIONS FOR PROCEDURE:  Brad [name] is a 55-  year-old right-hand dominant white male who is status post multiple I&Ds  for right shoulder postoperative infection; status post rotator cuff  repair with disruption of his repair, and now an irreparable posterior  cuff.  We discussed preoperatively the risks and benefits of surgery  including the possibility of persistent infection, need for further  surgery, nerve injury, vessel injury, and others.  We were hopeful of  closing his wound today.   BRIEF DESCRIPTION OF PROCEDURE:  Brad [name] was taken to the OR where  general anesthesia was induced.  He was positioned supine on the table  this time with a bump under his shoulder, as, again, we were  anticipating closure.  The wound V.A.C. was removed; and the deltoid  appeared in excellent shape, except a 1 cm area near the distal extent  of the incision where there appeared to be a small amount of purulent  drainage.  The shoulder was expressed, and an outflow of purulent  material occurred.   We proceed with a full prep and drape of the right  upper extremity; and then obtained anaerobic, and aerobic cultures which  were sent to micro with results as listed above.   A thorough irrigation and debridement was then performed of the shoulder  joint.  We did end up having to debride some more of the posterior cuff  which was nonviable.  We, again, did look for the retained push lock  device and did not identify it.  Further debridement was performed along  the insertion site of the posterior cuff; and then a 3 liter bag was  used through cysto tubing to thoroughly irrigate, as we had already done  so, with several liters of saline using a bulb syringe.  In order to  avoid direct contact of the sponge with the glenohumeral joint, and to  maintain a moist environment for the cartilage, we did apply a double  layer of silicone over this using Mepitel and then left the wound V.A.C.  in both the deep and superficial soft tissues.  After the debridement  and prior  to the wound V.A.C. application, we did apply fresh drapes and  attire.  The patient was then awakened from anesthesia and transported  to the PACU in stable condition.   PROGNOSIS:  Brad [name] prognosis is poor for overall function, at  this time, as I have previously discussed with the family.  His  posterior cuff is irreparable; and we have not yet been able to  adequately control his infection despite numerous I&Ds and IVs, and oral  antibiotics.  I will contact Dr. Orvan Falconer, now, to further discuss our  options for broader coverage.  I also will obtain consultation from  another colleague with regard to further interventions on the shoulder,  for at least consultation and possible intervention.   Also, I remain extremely concerned about his compliance; as he showed  great facility in unhooking his wound V.A.C. device in the preoperative  holding area.  He also had been drinking coffee prior to his procedure,  despite  the n.p.o. orders for the last three consecutive surgeries.  Also, when I spoke with his fiancee, following his latest surgery, she  actually became tearful when discussing the patient's noncompliance; and  stated that she had, on occasion, simply needed to leave the room as she  was concerned about this.  The patient did present this morning with an  old sling without its abduction pad, but not in the airplane splint  obtained for him.  These issues will, of course, need to be discussed  very frankly with any Editor, commissioning.      Doralee Albino. Carola Frost, M.D.  Electronically Signed     MHH/MEDQ  D:  10/18/2006  T:  10/19/2006  Job:  [id number]

## 2010-12-23 NOTE — Op Note (Signed)
NAMEHILDING, QUINTANAR             ACCOUNT NO.:  [id number]   MEDICAL RECORD NO.:  [id number]          PATIENT TYPE:  AMB   LOCATION:  SDS                          FACILITY:  [facility]   PHYSICIAN:  Doralee Albino. Carola Frost, M.D. DATE OF BIRTH:  [birth year]   DATE OF PROCEDURE:  10/22/2006  DATE OF DISCHARGE:  10/22/2006                               OPERATIVE REPORT   PREOPERATIVE DIAGNOSIS:  Persistent right shoulder infection.   POSTOPERATIVE DIAGNOSIS:  Persistent right shoulder infection.   PROCEDURES:  1. Irrigation and debridement of right shoulder joint.  2. Delayed primary closure of right shoulder wound, 8 cm.   SURGEON:  Doralee Albino. Carola Frost, M.D.   ASSISTANT:  None.   ANESTHESIA:  General.   COMPLICATIONS:  None.   SPECIMENS:  None today.   ESTIMATED BLOOD LOSS:  Minimal.   DRAINS:  One medium Hemovac placed within the glenohumeral joint.   DISPOSITION:  To PACU.   CONDITION:  Stable.  December   BRIEF SUMMARY OF INDICATION FOR PROCEDURE:  Brad [name] is a 55-year-  old right-hand dominant male with multiple previous procedures for a  right shoulder infection.  He returns again for irrigation and  debridement, possible Wound VAC. versus closure.  He understood the risk  of recurrence of the infection and need for further surgery nerve  injury, vessel injury, DVT, PE, loss of motion and others.  After full  discussion, he wished to proceed.   BRIEF DESCRIPTION OF PROCEDURE:  Mr. Poole was taken to the operating  room, where general anesthesia was induced.  He did receive Ancef  preoperatively.  His right upper extremity was prepped and draped in the  usual sterile fashion after removal of the Wound VAC.  The wound  appeared to be very healthy in general.  There was no evidence of  purulence of any kind today.  The cartilage on the humeral head appeared  well-preserved and not desiccated as it had been covered with Mepitel  silicone using two layers at his previous  washout.  Six liters of low-  pressure saline were used to thoroughly irrigate the glenohumeral joint  and soft tissues.  We did perform a limited debridement of any  questionable tissue both sharply and with the rongeur prior to that  irrigation.  We then changed the towels and drapes and placed a medium  Hemovac, bringing it out distally in the deltoid somewhat anteriorly and  allowing it to lay posteriorly within the glenohumeral space  posteriorly.  A 0 PDS was used in figure-of-eight fashion and 2-0 PDS  and then finally 2-0 nylon with vertical mattress given the chronic  nature of this wound and the limited inversion about the epidermal  layer.  A sterile gently compressive dressing and sling was then  applied.  The patient was awakened from anesthesia and transported to  the PACU in stable condition.   PROGNOSIS:  Mr. Gignac prognosis for resolution of this infection  appears favorable at the current time.  Again there was no evidence of  any purulence and the patient has been compliant with both the  vancomycin  and the Cipro as far as we can discern.  I did sew in the  drain because I was somewhat concerned about the possibility of  manipulating this device.  The patient has been continuing to disconnect  his VAC hose and has been periods of time disconnected without end caps  on either tube while either going to the bathroom or just moving about  in the house.  When he presented to the OR today, the VAC was not hooked  up because there was no additional cartridge for the machine at home.  Again, these items suggest a pattern of noncompliance which is  concerning in his postoperative recovery but at this time we appear to  have made good progress with regard to resolution of the infection and I  am hopeful this will translate into a favorable outcome as his deltoid  still appeared to be in good shape.  His supraspinatus tendon was still  intact over the anterolateral corner of  his humeral head, and we will  plan to follow up with him in just 2 days or so to make sure that we  maintain steady progress.  I will not take out the drain until we are  sure that the wound is in stable condition.      Doralee Albino. Carola Frost, M.D.  Electronically Signed     MHH/MEDQ  D:  10/22/2006  T:  10/23/2006  Job:  [id number]

## 2010-12-23 NOTE — Op Note (Signed)
Brad [name], Brad [name]             ACCOUNT NO.:  [id number]   MEDICAL RECORD NO.:  [id number]          PATIENT TYPE:  AMB   LOCATION:  SDS                          FACILITY:  [facility]   PHYSICIAN:  Doralee Albino. Carola Frost, M.D. DATE OF BIRTH:  03-31-1956   DATE OF PROCEDURE:  10/11/2006  DATE OF DISCHARGE:                               OPERATIVE REPORT   PREOPERATIVE DIAGNOSES:  1. Persistent infection, right shoulder.  2. Possible re-rupture of rotator cuff revision repair.   POSTOPERATIVE DIAGNOSES:  1. Persistent drainage, right shoulder.  2. Disruption of rotator cuff repair.   PROCEDURES:  1. Repeat irrigation and debridement of the shoulder joint and      surgical wound.  2. Revision repair of rotator cuff.  3. Application of Wound-V.A.C., right shoulder.   SURGEON:  Doralee Albino. Carola Frost, M.D.   ASSISTANT:  None.   ANESTHESIA:  General, Dr. Gelene Mink.   SPECIMENS:  Two anaerobic and aerobic cultures sent to micro.   ESTIMATED BLOOD LOSS:  Less than 100 mL.   DRAINS:  One, the Wound-V.A.C.   BRIEF SUMMARY OF INDICATION FOR PROCEDURE:  Brad [name] is a 55-year-  old right-hand dominant male who was status post right shoulder rotator  cuff repair, who experienced disruption of his repair and subsequent  drainage.  The patient was taken back to the OR and closed over a drain  after thorough irrigation and debridement and revision repair of his  cuff and joint.  He now presents to clinic with persistent drainage.  The patient has been noncompliant since his initial operation with  regard to activity restrictions and range of motion restrictions with  the shoulder.  We discussed preoperatively the risks and benefits of  surgery including the possibility of persistent infection, failure to  resolve it, the need for placement of a Wound-V.A.C., inability to  obtain closure of the capsule and cuff, and need for further surgery as  well as neurovascular injury and others.  After  full discussion, the  patient wished to proceed, as did his fiancee.  The patient remained on  vancomycin and Cipro per the recommendations of Dr. Orvan Falconer from  infectious disease.  All cultures to date have been negative.   BRIEF SUMMARY OF PROCEDURE:  Brad [name] was taken to the operating  room, where general anesthesia was induced.  His right upper extremity  was then prepped and draped in the usual sterile fashion.  The open  wound was about 1.5 cm and was draining clear fluid.  The old sutures  were removed, the wound reopened, and no purulence encountered but once  more, the posterior rotator cuff repair was disrupted.  This area was  debrided and thoroughly irrigated.  Questionably infected tissues  appeared to be primarily lateral and in the area of the repair and there  was no significant infection medially.  The wound again underwent a  thorough irrigation and then those instruments were passed off after  cultures were obtained.  The infraspinatus tendon was then grasped with  several #1 Prolene sutures using [place] technique.  As there was  not sufficient  excursion given the infection and loss of tissue to take  it all way to the site of insertion, which appeared to be somewhat  tenuous material anyway, we instead repaired it through drill holes in  the tuberosity given that the wound in general appeared quite clean.  These were tied on the anterior side, resulting in appropriate  restoration of the cuff.  The superior interval was reapproximated as  well then Wound-V.A.C. placed directly over this.  It was set to 125  mmHg of continuous suction.  The patient's arm was then held in slight  abduction and external rotation until a gunslinger-type brace could be  fitted to reduce tension on the repair and reinforce mentally the  importance of compliance with all motion and activity restrictions.  He  was taken to the PACU in stable condition.   PROGNOSIS:  Brad [name]  prognosis is fair.  He has demonstrated  noncompliance on the last two postoperative occasions and this is quite  concerning, given the difficult medical situation that he is facing.  His repair his obviously at increased risk for disruption even with  compliance given the ongoing fight against the infection, but it appears  to be in somewhat of a stable position at this time.  I am hopeful that  the Wound-V.A.C. can begin to tip the balance in our favor toward  resolution of the infection.  We will plan to return to the OR in 4 days  for possible closure versus reapplication of the Wound-V.A.C.  He will  remain on vancomycin and Cipro and I will ask him to stop the Voltaren,  as this can be a vasoconstrictor and we want maximal blood flow into the  area.      Doralee Albino. Carola Frost, M.D.  Electronically Signed     MHH/MEDQ  D:  10/11/2006  T:  10/11/2006  Job:  [id number]

## 2011-02-07 ENCOUNTER — Telehealth: Payer: Self-pay | Admitting: *Deleted

## 2011-02-07 ENCOUNTER — Ambulatory Visit: Payer: Self-pay | Admitting: Internal Medicine

## 2011-02-07 ENCOUNTER — Encounter: Payer: Self-pay | Admitting: *Deleted

## 2011-02-07 DIAGNOSIS — Z0289 Encounter for other administrative examinations: Secondary | ICD-10-CM

## 2011-02-07 NOTE — Telephone Encounter (Signed)
Same Day Abstraction. 

## 2011-05-12 ENCOUNTER — Other Ambulatory Visit: Payer: Self-pay | Admitting: Internal Medicine

## 2011-06-08 ENCOUNTER — Other Ambulatory Visit: Payer: Self-pay | Admitting: Internal Medicine

## 2011-06-08 MED ORDER — METFORMIN HCL ER 500 MG PO TB24: 1000.0000 mg | ORAL_TABLET | Freq: Every day | ORAL | Status: DC

## 2011-08-08 ENCOUNTER — Emergency Department (HOSPITAL_COMMUNITY): Payer: Medicare Other

## 2011-08-08 ENCOUNTER — Emergency Department (HOSPITAL_COMMUNITY)
Admission: EM | Admit: 2011-08-08 | Discharge: 2011-08-08 | Disposition: A | Discharge status: Home / Self Care | Payer: Medicare Other | Attending: Emergency Medicine | Admitting: Emergency Medicine

## 2011-08-08 ENCOUNTER — Encounter (HOSPITAL_COMMUNITY): Payer: Self-pay | Admitting: *Deleted

## 2011-08-08 DIAGNOSIS — E119 Type 2 diabetes mellitus without complications: Secondary | ICD-10-CM | POA: Insufficient documentation

## 2011-08-08 DIAGNOSIS — F329 Major depressive disorder, single episode, unspecified: Secondary | ICD-10-CM | POA: Insufficient documentation

## 2011-08-08 DIAGNOSIS — F17200 Nicotine dependence, unspecified, uncomplicated: Secondary | ICD-10-CM | POA: Diagnosis not present

## 2011-08-08 DIAGNOSIS — M25562 Pain in left knee: Secondary | ICD-10-CM

## 2011-08-08 DIAGNOSIS — IMO0001 Reserved for inherently not codable concepts without codable children: Secondary | ICD-10-CM | POA: Insufficient documentation

## 2011-08-08 DIAGNOSIS — W010XXA Fall on same level from slipping, tripping and stumbling without subsequent striking against object, initial encounter: Secondary | ICD-10-CM | POA: Insufficient documentation

## 2011-08-08 DIAGNOSIS — M25569 Pain in unspecified knee: Secondary | ICD-10-CM | POA: Insufficient documentation

## 2011-08-08 DIAGNOSIS — J449 Chronic obstructive pulmonary disease, unspecified: Secondary | ICD-10-CM | POA: Insufficient documentation

## 2011-08-08 DIAGNOSIS — Z7982 Long term (current) use of aspirin: Secondary | ICD-10-CM | POA: Diagnosis not present

## 2011-08-08 DIAGNOSIS — F3289 Other specified depressive episodes: Secondary | ICD-10-CM | POA: Insufficient documentation

## 2011-08-08 DIAGNOSIS — K219 Gastro-esophageal reflux disease without esophagitis: Secondary | ICD-10-CM | POA: Diagnosis not present

## 2011-08-08 DIAGNOSIS — Z79899 Other long term (current) drug therapy: Secondary | ICD-10-CM | POA: Diagnosis not present

## 2011-08-08 DIAGNOSIS — J4489 Other specified chronic obstructive pulmonary disease: Secondary | ICD-10-CM | POA: Insufficient documentation

## 2011-08-08 DIAGNOSIS — M25469 Effusion, unspecified knee: Secondary | ICD-10-CM | POA: Insufficient documentation

## 2011-08-08 DIAGNOSIS — Y92009 Unspecified place in unspecified non-institutional (private) residence as the place of occurrence of the external cause: Secondary | ICD-10-CM | POA: Insufficient documentation

## 2011-08-08 MED ORDER — TRAMADOL HCL 50 MG PO TABS: 50.0000 mg | ORAL_TABLET | Freq: Four times a day (QID) | ORAL | Status: AC | PRN

## 2011-08-08 NOTE — ED Notes (Signed)
Pt slide last month and hurt knee.  Then had swelling to left knee and went down and now has swelled after walking.  Pt has red spot to right calf

## 2011-08-08 NOTE — Progress Notes (Signed)
Orthopedic Tech Progress Note Patient Details:  Brad [name] 11/01/1955 [id number]  Other Ortho Devices Type of Ortho Device: Knee Sleeve Ortho Device Location: applied to left knee   [name] 08/08/2011, 8:50 AM

## 2011-08-08 NOTE — ED Provider Notes (Signed)
History     CSN: [id number]  Arrival date & time 08/08/11  1308   First MD Initiated Contact with Patient 08/08/11 [phone]      Chief Complaint  Patient presents with  . Knee Injury    (Consider location/radiation/quality/duration/timing/severity/associated sxs/prior treatment) Patient is a 56 y.o. male presenting with knee pain. The history is provided by the patient.  Knee Pain Associated symptoms include arthralgias, joint swelling and myalgias. Pertinent negatives include no fever, numbness, rash or weakness.   Patient states that he slipped while at home about 2-3 weeks ago. He felt that his left leg deviated outward as he fell. He has noted that his knee has been swollen since then. This gets worse if he's been standing or walking all day. He has not noticed any redness, and there is no focal area of tenderness. He denies noting any clicking, catching, popping with walking or movement. He is able to bear wt and has not noted any change in his ability to ambulate. Has been using Goody powder for pain. Has not tried ice/heat.  No remote hx of injury to the knee or surgery. 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 He has also experienced "tightness" to the L calf for about the past 6 months; this improves with massage and heat.  Past Medical History  Diagnosis Date  . Depression   . GERD (gastroesophageal reflux disease)   . Diabetes mellitus type II   . COPD (chronic obstructive pulmonary disease)   . Ventral hernia   . Chronic LBP     Past Surgical History  Procedure Date  . Neg stress test 2000  . Rotator cuff repair 2009    multiple f/u Sxs/infection Tx  . Incision and drainage perirectal abscess 03/2009    Family History  Problem Relation Age of Onset  . Alcohol abuse Father   . Lung cancer Father   . Heart disease Mother     automated implantable cardioverter-defibrillator   .  Diabetes type II Mother   . Lung cancer Maternal Grandmother   . Lung cancer Sister     History  Substance Use Topics  . Smoking status: Current Everyday Smoker -- 40 years    Types: Cigarettes  . Smokeless tobacco: Not on file   Comment: 1-2 pks per day  . Alcohol Use: Yes     Social      Review of Systems  Constitutional: Negative.  Negative for fever.  Cardiovascular: Negative for palpitations.  Musculoskeletal: Positive for myalgias, joint swelling and arthralgias. Negative for gait problem.  Skin: Negative for rash.  Neurological: Negative for weakness and numbness.    Allergies  Oxycodone  Home Medications   Current Outpatient Rx  Name Route Sig Dispense Refill  . ASPIRIN 81 MG PO CHEW Oral Chew 81 mg by mouth daily.      Marlin Canary HEADACHE PO Oral Take 1 packet by mouth 2 (two) times daily as needed. For pain      . METFORMIN HCL 500 MG PO TABS Oral Take 1,000 mg by mouth daily with breakfast.      . ANALGESIC BALM 15-15 % EX OINT Topical Apply 1 application topically daily as needed. For muscle pain      . ACCU-CHEK AVIVA PLUS W/DEVICE KIT Does not apply 1 each by Does not apply route as directed. 1 kit 0  . GLUCOSE BLOOD VI  STRP  Use as instructed 100 each 2  . LANCETS MISC Does not apply by Does not apply route daily.        BP 190/81  Pulse 88  Temp(Src) 97.5 F (36.4 C) (Oral)  Resp 18  SpO2 97%  Physical Exam  Nursing note and vitals reviewed. Constitutional: He appears well-developed and well-nourished. No distress.  HENT:  Head: Normocephalic and atraumatic.  Musculoskeletal: He exhibits no edema.       Left knee: He exhibits normal range of motion, no swelling, no effusion, no deformity, no erythema, no LCL laxity, normal patellar mobility and no MCL laxity. No patellar tendon tenderness noted.       Calcified nodule just inferior to patella consistent with calcifications seen on plain film. Slight ttp over medial jt line. Ligamentous testing  negative. McMurray's negative, but this does reproduce the pt's sx. Calf muscles are palpably in spasm. Negative Homans. No erythema or edema noted to knee or calf. LE neurovasc intact with sensory intact to lt touch. Good pedal pulses. Cap refill <3.   Neurological: He is alert.  Skin: Skin is warm and dry. He is not diaphoretic.    ED Course  Procedures (including critical care time)  Labs Reviewed - No data to display Dg Knee Complete 4 Views Left  08/08/2011  *RADIOLOGY REPORT*  Clinical Data: 84 left knee.  LEFT KNEE - COMPLETE 4+ VIEW  Comparison: None.  Findings: Dystrophic calcifications are seen anterior to the proximal tibia along the inferior margin of the patellar tendon. These are likely related to prior injury.  There is irregularity of the medial joint space compartment of the knee with at least one, and possibly two, osteochondral fragments along the articular surface of the medial femoral condyle.  This is also suggestive of prior injury.  No acute fracture, dislocation, bony lesion or joint effusion is identified.  IMPRESSION: Findings consistent with prior injuries including osteochondral fragments along the articular surface of the medial femoral condyle and dystrophic calcification along the distal aspect of the patellar tendon and anterior to the distal tibia.  Original Report Authenticated By: Reola Calkins, M.D.     1. Knee pain, left       MDM  Knee exam is negative for ligamentous laxity. McMurray's negative, but his pain is reproducible with this. He has full range of motion, no focal tenderness, and no effusion noted. Xray reviewed by myself, no acute findings with good jt space. There do appear to be some small bony fragments along medial jt space; it is possible that this is causing his pain. It is also possible that he has meniscal pathology given his mechanism of injury. Will refer to ortho; recommended RICE and knee sleeve in meantime. I do not have suspicion  for DVT with his calf "tightness"; this seems c/w muscle spasm on exam. Negative Homans; leg not red or swollen. Discussed findings with pt and plan. He verbalized understanding and agreed to plan.        Grant Fontana, [place] 08/08/11 [phone]

## 2011-08-08 NOTE — ED Notes (Signed)
Pt reports slipping in the bathroom 17 days ago, his (L) leg went to the left and his body went to the right.  Pt reports that his knee has been swollen and tender since that time.  No brusing or deformity noted. Pt ambulatory with a limp.

## 2011-08-10 NOTE — ED Provider Notes (Signed)
History/physical exam/procedure(s) were performed by non-physician practitioner and as supervising physician I was immediately available for consultation/collaboration. I have reviewed all notes and am in agreement with care and plan.   Selam Pietsch S Truth Wolaver, MD 08/10/11 1239 

## 2011-09-07 ENCOUNTER — Other Ambulatory Visit: Payer: Self-pay | Admitting: Internal Medicine

## 2011-12-30 ENCOUNTER — Encounter: Payer: Self-pay | Admitting: Internal Medicine

## 2011-12-30 DIAGNOSIS — F32A Depression, unspecified: Secondary | ICD-10-CM | POA: Insufficient documentation

## 2011-12-30 DIAGNOSIS — F329 Major depressive disorder, single episode, unspecified: Secondary | ICD-10-CM | POA: Insufficient documentation

## 2011-12-30 DIAGNOSIS — M545 Low back pain, unspecified: Secondary | ICD-10-CM | POA: Insufficient documentation

## 2011-12-30 DIAGNOSIS — K219 Gastro-esophageal reflux disease without esophagitis: Secondary | ICD-10-CM | POA: Insufficient documentation

## 2011-12-30 DIAGNOSIS — J449 Chronic obstructive pulmonary disease, unspecified: Secondary | ICD-10-CM | POA: Insufficient documentation

## 2011-12-30 DIAGNOSIS — G8929 Other chronic pain: Secondary | ICD-10-CM | POA: Insufficient documentation

## 2011-12-30 DIAGNOSIS — Z0000 Encounter for general adult medical examination without abnormal findings: Secondary | ICD-10-CM | POA: Insufficient documentation

## 2011-12-30 DIAGNOSIS — E119 Type 2 diabetes mellitus without complications: Secondary | ICD-10-CM | POA: Insufficient documentation

## 2012-01-08 ENCOUNTER — Ambulatory Visit: Payer: Medicare Other | Admitting: Internal Medicine

## 2012-01-09 ENCOUNTER — Ambulatory Visit: Payer: Medicare Other | Admitting: Internal Medicine

## 2012-01-16 ENCOUNTER — Ambulatory Visit: Payer: Medicare Other | Admitting: Internal Medicine

## 2012-05-24 DIAGNOSIS — Z23 Encounter for immunization: Secondary | ICD-10-CM | POA: Diagnosis not present

## 2013-01-22 ENCOUNTER — Encounter (HOSPITAL_COMMUNITY): Payer: Self-pay | Admitting: Emergency Medicine

## 2013-01-22 ENCOUNTER — Emergency Department (HOSPITAL_COMMUNITY)
Admission: EM | Admit: 2013-01-22 | Discharge: 2013-01-22 | Disposition: A | Discharge status: Home / Self Care | Payer: Medicaid Other | Attending: Emergency Medicine | Admitting: Emergency Medicine

## 2013-01-22 DIAGNOSIS — F17200 Nicotine dependence, unspecified, uncomplicated: Secondary | ICD-10-CM | POA: Insufficient documentation

## 2013-01-22 DIAGNOSIS — Z8719 Personal history of other diseases of the digestive system: Secondary | ICD-10-CM | POA: Insufficient documentation

## 2013-01-22 DIAGNOSIS — J449 Chronic obstructive pulmonary disease, unspecified: Secondary | ICD-10-CM | POA: Insufficient documentation

## 2013-01-22 DIAGNOSIS — E119 Type 2 diabetes mellitus without complications: Secondary | ICD-10-CM | POA: Insufficient documentation

## 2013-01-22 DIAGNOSIS — Z8659 Personal history of other mental and behavioral disorders: Secondary | ICD-10-CM | POA: Insufficient documentation

## 2013-01-22 DIAGNOSIS — L03211 Cellulitis of face: Secondary | ICD-10-CM | POA: Insufficient documentation

## 2013-01-22 DIAGNOSIS — J4489 Other specified chronic obstructive pulmonary disease: Secondary | ICD-10-CM | POA: Insufficient documentation

## 2013-01-22 DIAGNOSIS — G8929 Other chronic pain: Secondary | ICD-10-CM | POA: Insufficient documentation

## 2013-01-22 DIAGNOSIS — M545 Low back pain, unspecified: Secondary | ICD-10-CM | POA: Insufficient documentation

## 2013-01-22 DIAGNOSIS — L0201 Cutaneous abscess of face: Secondary | ICD-10-CM | POA: Insufficient documentation

## 2013-01-22 MED ORDER — CEPHALEXIN 500 MG PO CAPS: 500.0000 mg | ORAL_CAPSULE | Freq: Four times a day (QID) | ORAL | Status: DC

## 2013-01-22 MED ORDER — SULFAMETHOXAZOLE-TRIMETHOPRIM 800-160 MG PO TABS: 1.0000 | ORAL_TABLET | Freq: Two times a day (BID) | ORAL | Status: AC

## 2013-01-22 NOTE — ED Notes (Signed)
PT ambulated with baseline gait; VSS; A&Ox3; no signs of distress; respirations even and unlabored; skin warm and dry; no questions upon discharge.  

## 2013-01-22 NOTE — ED Notes (Signed)
Pt, stated, i had a spider on my face and I knocked it off, but it keeps swelling, its oozing some.

## 2013-01-22 NOTE — ED Provider Notes (Signed)
History     CSN: [id number]  Arrival date & time 01/22/13  1110   First MD Initiated Contact with Patient 01/22/13 1218      Chief Complaint  Patient presents with  . Insect Bite    (Consider location/radiation/quality/duration/timing/severity/associated sxs/prior treatment) HPI Comments: Patient presents for R facial swelling and redness X 4 days. No aggravating or alleviating factors. Gradually worsening since onset. Patient believes he may have been bit by a spider. Denies associated fevers, chills, vision changes, ear pain or d/c, and difficulty speaking or swallowing.  The history is provided by the patient. No language interpreter was used.    Past Medical History  Diagnosis Date  . Depression   . GERD (gastroesophageal reflux disease)   . Diabetes mellitus type II   . COPD (chronic obstructive pulmonary disease)   . Ventral hernia   . Chronic LBP     Past Surgical History  Procedure Laterality Date  . Neg stress test  2000  . Rotator cuff repair  2009    multiple f/u Sxs/infection Tx  . Incision and drainage perirectal abscess  03/2009    Family History  Problem Relation Age of Onset  . Alcohol abuse Father   . Lung cancer Father   . Heart disease Mother     automated implantable cardioverter-defibrillator   . Diabetes type II Mother   . Lung cancer Maternal Grandmother   . Lung cancer Sister     History  Substance Use Topics  . Smoking status: Current Every Day Smoker -- 40 years    Types: Cigarettes  . Smokeless tobacco: Never Used     Comment: 1-2 pks per day  . Alcohol Use: Yes     Comment: Social      Review of Systems  Constitutional: Negative for fever.  HENT: Positive for facial swelling. Negative for ear pain, drooling, trouble swallowing and ear discharge.   Eyes: Negative for pain and visual disturbance.  Respiratory: Negative for shortness of breath.   Skin: Positive for color change (erythema of R side of face).  Neurological:  Negative for weakness and numbness.  All other systems reviewed and are negative.    Allergies  Doxycycline and Oxycodone  Home Medications   Current Outpatient Rx  Name  Route  Sig  Dispense  Refill  . Aspirin-Acetaminophen-Caffeine (GOODY HEADACHE PO)   Oral   Take 1 packet by mouth 2 (two) times daily as needed. For pain           . methyl salicylate-menthol (BENGAY) ointment   Topical   Apply 1 application topically daily as needed. For muscle pain           . cephALEXin (KEFLEX) 500 MG capsule   Oral   Take 1 capsule (500 mg total) by mouth 4 (four) times daily.   40 capsule   0   . sulfamethoxazole-trimethoprim (BACTRIM DS,SEPTRA DS) 800-160 MG per tablet   Oral   Take 1 tablet by mouth 2 (two) times daily.   20 tablet   0     BP 168/80  Pulse 65  Temp(Src) 97.9 F (36.6 C) (Oral)  Resp 16  SpO2 97%  Physical Exam  Nursing note and vitals reviewed. Constitutional: He is oriented to person, place, and time. He appears well-developed and well-nourished. No distress.  HENT:  Head: Normocephalic and atraumatic.    Right Ear: Tympanic membrane, external ear and ear canal normal.  Left Ear: Tympanic membrane, external ear and ear  canal normal.  Nose: Nose normal.  Mouth/Throat: Uvula is midline, oropharynx is clear and moist and mucous membranes are normal. No edematous. No oropharyngeal exudate, posterior oropharyngeal edema or posterior oropharyngeal erythema.  Abscess of R side of face that is erythematous, warm-to-touch and indurated without area of fluctuance. No active drainage from center. Erythema with mild swelling extend to inferior aspect of R eye. Airway patent.  Eyes: Conjunctivae and EOM are normal. Pupils are equal, round, and reactive to light. Right eye exhibits no discharge. Left eye exhibits no discharge. No scleral icterus.  No pain with eye movement.  Neck: Normal range of motion. Neck supple.  Cardiovascular: Normal rate, regular  rhythm and normal heart sounds.   Pulmonary/Chest: Effort normal and breath sounds normal. No stridor.  Neurological: He is alert and oriented to person, place, and time.  Skin: Skin is warm and dry. He is not diaphoretic. There is erythema.  Psychiatric: He has a normal mood and affect. His behavior is normal.    ED Course  Procedures (including critical care time)  Labs Reviewed - No data to display No results found.   1. Abscess of face     MDM  Uncomplicated abscess of R cheek. Patient afebrile without tachycardia or hypotension. There is no pain with eye movements on exam and no area of fluctuance to suggest drainable abscess in ED. Patient seen also by Dr. Ignacia Palma who is in agreement that I&D today is not favorable. Patient to be d/c with course of Bactrim and Keflex. Warm compresses advised multiple times per day to promote drainage. Patient instructed to follow up in 48 hours for a recheck of his abscess, especially in light of mild swelling and erythema inferior to lower R eyelid. Indications for ED return discussed with patient who verbalizes comfort and understanding with this discharge plan.        Antony Madura, PA-C 01/27/13 1728

## 2013-01-29 NOTE — ED Provider Notes (Signed)
Medical screening examination/treatment/procedure(s) were conducted as a shared visit with non-physician practitioner(s) and myself.  I personally evaluated the patient during the encounter Pt has indurated area of cellulitis on right cheek, not amenable to I&D.  Advised Rx with ABX and recheck in 2 days.  Carleene Cooper III, MD 01/29/13 [phone]

## 2013-08-27 ENCOUNTER — Ambulatory Visit: Payer: Medicare Other | Admitting: Internal Medicine

## 2013-09-10 ENCOUNTER — Ambulatory Visit: Payer: Medicare Other | Admitting: Internal Medicine

## 2013-10-08 ENCOUNTER — Ambulatory Visit: Payer: Medicare Other | Admitting: Internal Medicine

## 2013-10-10 ENCOUNTER — Ambulatory Visit (INDEPENDENT_AMBULATORY_CARE_PROVIDER_SITE_OTHER)
Admission: RE | Admit: 2013-10-10 | Discharge: 2013-10-10 | Disposition: A | Discharge status: Home / Self Care | Payer: Medicaid Other | Source: Ambulatory Visit | Attending: Internal Medicine | Admitting: Internal Medicine

## 2013-10-10 ENCOUNTER — Encounter: Payer: Self-pay | Admitting: Internal Medicine

## 2013-10-10 ENCOUNTER — Ambulatory Visit (INDEPENDENT_AMBULATORY_CARE_PROVIDER_SITE_OTHER): Payer: Medicare Other | Admitting: Internal Medicine

## 2013-10-10 ENCOUNTER — Other Ambulatory Visit (INDEPENDENT_AMBULATORY_CARE_PROVIDER_SITE_OTHER): Payer: Medicaid Other

## 2013-10-10 VITALS — BP 132/80 | HR 80 | Temp 98.0°F | Ht 71.0 in | Wt 274.1 lb

## 2013-10-10 DIAGNOSIS — R0609 Other forms of dyspnea: Secondary | ICD-10-CM

## 2013-10-10 DIAGNOSIS — M79609 Pain in unspecified limb: Secondary | ICD-10-CM

## 2013-10-10 DIAGNOSIS — M79605 Pain in left leg: Secondary | ICD-10-CM

## 2013-10-10 DIAGNOSIS — R519 Headache, unspecified: Secondary | ICD-10-CM | POA: Insufficient documentation

## 2013-10-10 DIAGNOSIS — M79604 Pain in right leg: Secondary | ICD-10-CM | POA: Insufficient documentation

## 2013-10-10 DIAGNOSIS — R51 Headache: Secondary | ICD-10-CM | POA: Insufficient documentation

## 2013-10-10 DIAGNOSIS — N529 Male erectile dysfunction, unspecified: Secondary | ICD-10-CM

## 2013-10-10 DIAGNOSIS — R0989 Other specified symptoms and signs involving the circulatory and respiratory systems: Secondary | ICD-10-CM

## 2013-10-10 DIAGNOSIS — N320 Bladder-neck obstruction: Secondary | ICD-10-CM

## 2013-10-10 DIAGNOSIS — E119 Type 2 diabetes mellitus without complications: Secondary | ICD-10-CM

## 2013-10-10 DIAGNOSIS — R0600 Dyspnea, unspecified: Secondary | ICD-10-CM

## 2013-10-10 LAB — HEPATIC FUNCTION PANEL
ALT: 49 U/L (ref 0–53)
AST: 46 U/L — ABNORMAL HIGH (ref 0–37)
Albumin: 2.9 g/dL — ABNORMAL LOW (ref 3.5–5.2)
Alkaline Phosphatase: 126 U/L — ABNORMAL HIGH (ref 39–117)
Bilirubin, Direct: 0.1 mg/dL (ref 0.0–0.3)
Total Bilirubin: 0.4 mg/dL (ref 0.3–1.2)
Total Protein: 6.4 g/dL (ref 6.0–8.3)

## 2013-10-10 LAB — BASIC METABOLIC PANEL
BUN: 17 mg/dL (ref 6–23)
CO2: 29 mEq/L (ref 19–32)
Calcium: 8.9 mg/dL (ref 8.4–10.5)
Chloride: 99 mEq/L (ref 96–112)
Creatinine, Ser: 0.9 mg/dL (ref 0.4–1.5)
GFR: 95.25 mL/min (ref >60.00)
Glucose, Bld: 278 mg/dL — ABNORMAL HIGH (ref 70–99)
Potassium: 4.5 mEq/L (ref 3.5–5.1)
Sodium: 134 mEq/L — ABNORMAL LOW (ref 135–145)

## 2013-10-10 LAB — CBC WITH DIFFERENTIAL/PLATELET
Basophils Absolute: 0.1 10*3/uL (ref 0.0–0.1)
Basophils Relative: 0.8 % (ref 0.0–3.0)
Eosinophils Absolute: 0.2 10*3/uL (ref 0.0–0.7)
Eosinophils Relative: 2.9 % (ref 0.0–5.0)
HCT: 39.6 % (ref 39.0–52.0)
Hemoglobin: 13.5 g/dL (ref 13.0–17.0)
Lymphocytes Relative: 29.5 % (ref 12.0–46.0)
Lymphs Abs: 2.1 10*3/uL (ref 0.7–4.0)
MCHC: 34.1 g/dL (ref 30.0–36.0)
MCV: 76 fl — ABNORMAL LOW (ref 78.0–100.0)
Monocytes Absolute: 0.6 10*3/uL (ref 0.1–1.0)
Monocytes Relative: 9.2 % (ref 3.0–12.0)
Neutro Abs: 4.1 10*3/uL (ref 1.4–7.7)
Neutrophils Relative %: 57.6 % (ref 43.0–77.0)
Platelets: 225 10*3/uL (ref 150.0–400.0)
RBC: 5.21 Mil/uL (ref 4.22–5.81)
RDW: 14.6 % (ref 11.5–14.6)
WBC: 7 10*3/uL (ref 4.5–10.5)

## 2013-10-10 LAB — URINALYSIS, ROUTINE W REFLEX MICROSCOPIC
Bilirubin Urine: NEGATIVE
Ketones, ur: NEGATIVE
Leukocytes, UA: NEGATIVE
Nitrite: NEGATIVE
Specific Gravity, Urine: >=1.03 — AB (ref 1.000–1.030)
Total Protein, Urine: >=300 — AB
Urine Glucose: >=1000 — AB
Urobilinogen, UA: 0.2 (ref 0.0–1.0)
pH: 5.5 (ref 5.0–8.0)

## 2013-10-10 LAB — LIPID PANEL
Cholesterol: 148 mg/dL (ref 0–200)
HDL: 29.6 mg/dL — ABNORMAL LOW (ref >39.00)
LDL Cholesterol: 32 mg/dL (ref 0–99)
Total CHOL/HDL Ratio: 5
Triglycerides: 431 mg/dL — ABNORMAL HIGH (ref 0.0–149.0)
VLDL: 86.2 mg/dL — ABNORMAL HIGH (ref 0.0–40.0)

## 2013-10-10 LAB — TSH: TSH: 6.15 u[IU]/mL — ABNORMAL HIGH (ref 0.35–5.50)

## 2013-10-10 LAB — HEMOGLOBIN A1C: Hgb A1c MFr Bld: 9.1 % — ABNORMAL HIGH (ref 4.6–6.5)

## 2013-10-10 LAB — PSA: PSA: 0.54 ng/mL (ref 0.10–4.00)

## 2013-10-10 LAB — TESTOSTERONE: Testosterone: 352.49 ng/dL (ref 350.00–890.00)

## 2013-10-10 MED ORDER — ALBUTEROL SULFATE HFA 108 (90 BASE) MCG/ACT IN AERS: 2.0000 | INHALATION_SPRAY | Freq: Four times a day (QID) | RESPIRATORY_TRACT | Status: DC | PRN

## 2013-10-10 MED ORDER — NORTRIPTYLINE HCL 10 MG PO CAPS: 10.0000 mg | ORAL_CAPSULE | Freq: Every day | ORAL | Status: DC

## 2013-10-10 MED ORDER — TIOTROPIUM BROMIDE MONOHYDRATE 18 MCG IN CAPS: 18.0000 ug | ORAL_CAPSULE | Freq: Every day | RESPIRATORY_TRACT | Status: DC

## 2013-10-10 NOTE — Assessment & Plan Note (Addendum)
Likely uncontroled, but o/w stable overall by history and exam, recent data reviewed with pt, and pt to continue medical treatment as before,  to f/u any worsening symptoms or concerns Lab Results  Component Value Date   HGBA1C 9.2* 03/17/2010

## 2013-10-10 NOTE — Assessment & Plan Note (Signed)
Also for testosterone level,  to f/u any worsening symptoms or concerns j

## 2013-10-10 NOTE — Assessment & Plan Note (Signed)
Suggestive of claudication - for LE art dopplers

## 2013-10-10 NOTE — Assessment & Plan Note (Addendum)
ECG reviewed as per emr, also for cxr, echo, and labs as ordered,  to f/u any worsening symptoms or concerns

## 2013-10-10 NOTE — Assessment & Plan Note (Addendum)
Daily tension type, for nortryptilene asd,  to f/u any worsening symptoms or concerns  Note:  Total time for pt hx, exam, review of record with pt in the room, determination of diagnoses and plan for further eval and tx is > 40 min, with over 50% spent in coordination and counseling of patient

## 2013-10-10 NOTE — Progress Notes (Signed)
Pre visit review using our clinic review tool, if applicable. No additional management support is needed unless otherwise documented below in the visit note. 

## 2013-10-10 NOTE — Patient Instructions (Addendum)
Please take all new medication as prescribed - the headache medication by starting 1 pill at night for 1 wk, then 2 pills at night for 1 wk, then 3 pills at night after that Please take all new medication as prescribed - the inhaler for the lungs (the spiriva is every day, and the Proair is as needed) Please continue all other medications as before, and refills have been done if requested. Please have the pharmacy call with any other refills you may need.  Please continue your efforts at being more active, low cholesterol diabetic diet, and weight control.  Your EKG was OK today Please go to the XRAY Department in the Basement (go straight as you get off the elevator) for the x-ray testing Please go to the LAB in the Basement (turn left off the elevator) for the tests to be done today You will be contacted by phone if any changes need to be made immediately.  Otherwise, you will receive a letter about your results with an explanation, but please check with MyChart first.  You will be contacted regarding the referral for: Leg artery circulation test (see [place] now), and an echocardiogram  Please stop smoking  Please return in 1 months, or sooner if needed

## 2013-10-10 NOTE — Progress Notes (Signed)
Subjective:    Patient ID: Brad [name], male    DOB: 01-01-1956, 58 y.o.   MRN: [id number]  HPI  Here to f/u after last seen > 1 yr.  Pt denies chest pain, increased sob, wheezing, orthopnea, PND, increased LE swelling, palpitations, dizziness or syncope.  Pt denies polydipsia, polyuria, or low sugar symptoms such as weakness or confusion improved with po intake.  Pt denies new neurological symptoms such as facial or extremity weakness or numbness, but has daily left sided hemicranial  Headache. Pt states overall good compliance with meds, has not been trying to follow lower cholesterol, diabetic diet, with wt overall stable,  but little exercise however, and plans to work on diet.  Out for metformin for the past yr.  Also with reproducible LE burning pain bilat calves with ambulation, assoc with doe.  Cont's to smoke up to 2 ppd.  Asks for testosterone level with worsening ED symptoms over the past yr as well. Past Medical History  Diagnosis Date  . Depression   . GERD (gastroesophageal reflux disease)   . Diabetes mellitus type II   . COPD (chronic obstructive pulmonary disease)   . Ventral hernia   . Chronic LBP    Past Surgical History  Procedure Laterality Date  . Neg stress test  2000  . Rotator cuff repair  2009    multiple f/u Sxs/infection Tx  . Incision and drainage perirectal abscess  03/2009    reports that he has been smoking Cigarettes.  He has been smoking about 0.00 packs per day for the past 40 years. He has never used smokeless tobacco. He reports that he drinks alcohol. He reports that he does not use illicit drugs. family history includes Alcohol abuse in his father; Diabetes type II in his mother; Heart disease in his mother; Lung cancer in his father, maternal grandmother, and sister. Allergies  Allergen Reactions  . Doxycycline Hives and Itching  . Oxycodone Itching   Current Outpatient Prescriptions on File Prior to Visit  Medication Sig Dispense Refill    . Aspirin-Acetaminophen-Caffeine (GOODY HEADACHE PO) Take 1 packet by mouth 2 (two) times daily as needed. For pain         No current facility-administered medications on file prior to visit.     Review of Systems  Constitutional: Negative for unexpected weight change, or unusual diaphoresis  HENT: Negative for tinnitus.   Eyes: Negative for photophobia and visual disturbance.  Respiratory: Negative for choking and stridor.   Gastrointestinal: Negative for vomiting and blood in stool.  Genitourinary: Negative for hematuria and decreased urine volume.  Musculoskeletal: Negative for acute joint swelling Skin: Negative for color change and wound.  Neurological: Negative for tremors and numbness other than noted  Psychiatric/Behavioral: Negative for decreased concentration or  hyperactivity.       Objective:   Physical Exam BP 132/80  Pulse 80  Temp(Src) 98 F (36.7 C) (Oral)  Ht 5\' 11"  (1.803 m)  Wt 274 lb 2 oz (124.342 kg)  BMI 38.25 kg/m2  SpO2 95%  VS noted,  Constitutional: Pt appears well-developed and well-nourished.  HENT: Head: NCAT.  Right Ear: External ear normal.  Left Ear: External ear normal.  Eyes: Conjunctivae and EOM are normal. Pupils are equal, round, and reactive to light.  Neck: Normal range of motion. Neck supple.  Cardiovascular: Normal rate and regular rhythm.   Pulmonary/Chest: Effort normal and breath sounds normal.  Abd:  Soft, NT, non-distended, + BS Neurological: Pt is  alert. Not confused  Skin: Skin is warm. No erythema. dorsalis pedis trace bilat Psychiatric: Pt behavior is normal. Thought content normal.       Assessment & Plan:

## 2013-10-13 LAB — MICROALBUMIN / CREATININE URINE RATIO
Creatinine,U: 95.6 mg/dL
Microalb Creat Ratio: 289.2 mg/g — ABNORMAL HIGH (ref 0.0–30.0)
Microalb, Ur: 276.6 mg/dL — ABNORMAL HIGH (ref 0.0–1.9)

## 2013-10-14 ENCOUNTER — Other Ambulatory Visit (HOSPITAL_COMMUNITY): Payer: Self-pay | Admitting: Cardiology

## 2013-10-14 ENCOUNTER — Other Ambulatory Visit: Payer: Self-pay | Admitting: Internal Medicine

## 2013-10-14 ENCOUNTER — Encounter: Payer: Self-pay | Admitting: Internal Medicine

## 2013-10-14 DIAGNOSIS — I70219 Atherosclerosis of native arteries of extremities with intermittent claudication, unspecified extremity: Secondary | ICD-10-CM

## 2013-10-14 MED ORDER — GLIPIZIDE ER 2.5 MG PO TB24: 2.5000 mg | ORAL_TABLET | Freq: Every day | ORAL | Status: DC

## 2013-10-14 MED ORDER — METFORMIN HCL ER 500 MG PO TB24: 1000.0000 mg | ORAL_TABLET | Freq: Every day | ORAL | Status: DC

## 2013-10-15 ENCOUNTER — Telehealth: Payer: Self-pay | Admitting: *Deleted

## 2013-10-15 NOTE — Telephone Encounter (Signed)
Patient phoned requesting test results from 10/10/13  CB# [phone]

## 2013-10-15 NOTE — Telephone Encounter (Signed)
Patient contacted and informed of lab results.

## 2013-10-16 ENCOUNTER — Encounter: Payer: Self-pay | Admitting: Cardiovascular Disease

## 2013-10-16 ENCOUNTER — Encounter: Payer: Self-pay | Admitting: Internal Medicine

## 2013-10-16 ENCOUNTER — Other Ambulatory Visit: Payer: Self-pay | Admitting: Internal Medicine

## 2013-10-16 ENCOUNTER — Ambulatory Visit (HOSPITAL_COMMUNITY): Payer: Medicaid Other | Attending: Cardiovascular Disease | Admitting: Cardiology

## 2013-10-16 ENCOUNTER — Other Ambulatory Visit: Payer: Self-pay

## 2013-10-16 ENCOUNTER — Other Ambulatory Visit (HOSPITAL_COMMUNITY): Payer: Self-pay | Admitting: Cardiology

## 2013-10-16 DIAGNOSIS — I70219 Atherosclerosis of native arteries of extremities with intermittent claudication, unspecified extremity: Secondary | ICD-10-CM

## 2013-10-16 DIAGNOSIS — I739 Peripheral vascular disease, unspecified: Secondary | ICD-10-CM

## 2013-10-16 DIAGNOSIS — E1159 Type 2 diabetes mellitus with other circulatory complications: Secondary | ICD-10-CM

## 2013-10-16 MED ORDER — GLUCOSE BLOOD VI STRP: ORAL_STRIP | Status: DC

## 2013-10-16 MED ORDER — ONETOUCH LANCETS MISC: Status: DC

## 2013-10-16 NOTE — Progress Notes (Signed)
Lower arterial doppler and duplex complete

## 2013-10-22 ENCOUNTER — Telehealth: Payer: Self-pay

## 2013-10-22 NOTE — Telephone Encounter (Signed)
Relevant patient education mailed to patient.  

## 2013-10-29 ENCOUNTER — Encounter: Payer: Self-pay | Admitting: Cardiology

## 2013-10-29 ENCOUNTER — Ambulatory Visit (HOSPITAL_COMMUNITY): Payer: Medicaid Other | Attending: Cardiology | Admitting: Radiology

## 2013-10-29 DIAGNOSIS — R0609 Other forms of dyspnea: Secondary | ICD-10-CM | POA: Insufficient documentation

## 2013-10-29 DIAGNOSIS — R0602 Shortness of breath: Secondary | ICD-10-CM

## 2013-10-29 DIAGNOSIS — R0989 Other specified symptoms and signs involving the circulatory and respiratory systems: Secondary | ICD-10-CM | POA: Insufficient documentation

## 2013-10-29 DIAGNOSIS — R0600 Dyspnea, unspecified: Secondary | ICD-10-CM

## 2013-10-29 NOTE — Progress Notes (Signed)
Echocardiogram performed.  

## 2013-10-31 ENCOUNTER — Encounter: Payer: Self-pay | Admitting: Internal Medicine

## 2013-10-31 ENCOUNTER — Other Ambulatory Visit: Payer: Self-pay | Admitting: Internal Medicine

## 2013-11-06 ENCOUNTER — Ambulatory Visit: Payer: Medicaid Other | Admitting: Internal Medicine

## 2013-11-14 ENCOUNTER — Encounter: Payer: Self-pay | Admitting: Surgery

## 2013-11-17 ENCOUNTER — Encounter: Payer: Self-pay | Admitting: Surgery

## 2013-11-17 ENCOUNTER — Ambulatory Visit (INDEPENDENT_AMBULATORY_CARE_PROVIDER_SITE_OTHER): Payer: Medicare Other | Admitting: Surgery

## 2013-11-17 VITALS — BP 158/80 | HR 76 | Resp 18 | Ht 71.0 in | Wt 267.0 lb

## 2013-11-17 DIAGNOSIS — M79604 Pain in right leg: Secondary | ICD-10-CM

## 2013-11-17 DIAGNOSIS — M79609 Pain in unspecified limb: Secondary | ICD-10-CM | POA: Diagnosis not present

## 2013-11-17 DIAGNOSIS — M79605 Pain in left leg: Principal | ICD-10-CM

## 2013-11-17 MED ORDER — CILOSTAZOL 100 MG PO TABS: 100.0000 mg | ORAL_TABLET | Freq: Two times a day (BID) | ORAL | Status: DC

## 2013-11-17 NOTE — Addendum Note (Signed)
Addended by: Thresa Ross C on: 11/17/2013 10:02 AM   Modules accepted: Orders

## 2013-11-17 NOTE — Progress Notes (Signed)
Patient name: Brad [name] MRN: [id number] DOB: 1955/10/30 Sex: male   Referred by: [name]  Reason for referral:  Chief Complaint  Patient presents with  . New Evaluation    bilateral leg pain and heaviness/burning referred by Dr [name]    HISTORY OF PRESENT ILLNESS: This is a very pleasant 58 year old gentleman who was referred for evaluation of leg pain.  The patient states that he walks approximately 100-200 feet before he gets burning and pain in bilateral calves.  Resting alleviate his symptoms.  He denies rest pain or nonhealing ulcers.  The patient is a diabetic.  His blood sugars have not been adequately controlled.  He was recently started back on diabetes medication.  His hemoglobin A1c is 9.1.  The patient has a significant smoking history.  He has smoked 2 packs a day for approximately 47 years.  He used to work as a Building control surveyor.  He does have COPD.  He is not on oxygen.  He is not taking an aspirin.  His most recent cholesterol profile shows a low HDL at 29.  His LDL is 32 with a total cholesterol of 148.  The patient does not exercise much.  Past Medical History  Diagnosis Date  . Depression   . GERD (gastroesophageal reflux disease)   . Diabetes mellitus type II   . COPD (chronic obstructive pulmonary disease)   . Ventral hernia   . Chronic LBP     Past Surgical History  Procedure Laterality Date  . Neg stress test  2000  . Rotator cuff repair  2009    multiple f/u Sxs/infection Tx  . Incision and drainage perirectal abscess  03/2009    History   Social History  . Marital Status: Single    Spouse Name: N/A    Number of Children: 0  . Years of Education: N/A   Occupational History  . Disabled    Social History Main Topics  . Smoking status: Current Every Day Smoker -- 40 years    Types: Cigarettes  . Smokeless tobacco: Never Used     Comment: 1-2 pks per day  . Alcohol Use: Yes     Comment: Social  . Drug Use: No  . Sexual Activity: Not on  file   Other Topics Concern  . Not on file   Social History Narrative   Brad [name].   Disabled since June 2010 - right shoulder   No children.          Family History  Problem Relation Age of Onset  . Alcohol abuse Father   . Lung cancer Father   . Heart disease Mother     automated implantable cardioverter-defibrillator   . Diabetes type II Mother   . Lung cancer Maternal Grandmother   . Lung cancer Sister     Allergies as of 11/17/2013 - Review Complete 11/17/2013  Allergen Reaction Noted  . Doxycycline Hives and Itching 01/22/2013  . Oxycodone Itching 08/08/2011    Current Outpatient Prescriptions on File Prior to Visit  Medication Sig Dispense Refill  . albuterol (PROVENTIL HFA;VENTOLIN HFA) 108 (90 BASE) MCG/ACT inhaler Inhale 2 puffs into the lungs every 6 (six) hours as needed for wheezing or shortness of breath.  1 Inhaler  11  . Aspirin-Acetaminophen-Caffeine (GOODY HEADACHE PO) Take 1 packet by mouth 2 (two) times daily as needed. For pain        . glipiZIDE (GLIPIZIDE XL) 2.5 MG 24 hr tablet Take 1  tablet (2.5 mg total) by mouth daily with breakfast.  90 tablet  3  . glucose blood (ONE TOUCH ULTRA TEST) test strip Use as directed once daily to check blood sugar.  Diagnosis code 250.00  100 each  11  . metFORMIN (GLUCOPHAGE-XR) 500 MG 24 hr tablet Take 2 tablets (1,000 mg total) by mouth daily with breakfast.  180 tablet  3  . nortriptyline (PAMELOR) 10 MG capsule Take 1 capsule (10 mg total) by mouth at bedtime.  90 capsule  5  . ONE TOUCH LANCETS MISC Use as directed once daily to check blood sugar.  Diagnosis code 250.00  200 each  11  . tiotropium (SPIRIVA HANDIHALER) 18 MCG inhalation capsule Place 1 capsule (18 mcg total) into inhaler and inhale daily.  30 capsule  12   No current facility-administered medications on file prior to visit.     REVIEW OF SYSTEMS: Cardiovascular: No chest pain, chest pressure, palpitations, orthopnea, or dyspnea on  exertion.  Positive for pain in his legs when walking.,  No history of DVT or phlebitis. Pulmonary: No productive cough, asthma or wheezing. Neurologic: No weakness, paresthesias, aphasia, or amaurosis. No dizziness. Hematologic: No bleeding problems or clotting disorders. Musculoskeletal: No joint pain or joint swelling. Gastrointestinal: No blood in stool or hematemesis Genitourinary: No dysuria or hematuria. Psychiatric:: No history of major depression. Integumentary: No rashes or ulcers. Constitutional: No fever or chills.  PHYSICAL EXAMINATION: General: The patient appears their stated age.  Vital signs are BP 158/80  Pulse 76  Resp 18  Ht 5\' 11"  (1.803 m)  Wt 267 lb (121.11 kg)  BMI 37.26 kg/m2 HEENT:  No gross abnormalities Pulmonary: Respirations are non-labored Abdomen: Soft and non-tender  Musculoskeletal: There are no major deformities.   Neurologic: No focal weakness or paresthesias are detected, Skin: There are no ulcer or rashes noted. Psychiatric: The patient has normal affect. Cardiovascular: There is a regular rate and rhythm without significant murmur appreciated.  No carotid bruits.  Palpable left dorsalis pedis pulse nonpalpable right.  Palpable femoral pulses.  Diagnostic Studies: I have reviewed his outside ultrasound which shows an ABI of 0.71 on the right and 0.97 on the left.  There is a right mid superficial femoral artery stenosis with velocity profile of 4 32 cm/s    Assessment:  Peripheral vascular disease with claudication Plan: I discussed the natural history of peripheral vascular disease.  We will initially focused on medical management.  I discussed the importance of smoking cessation, or at least cutting back.  We also discussed implementing an exercise program and the details of such program.  I stressed the importance of improving his diabetes management in getting his A1c significantly lower.  I am placing him on cilostazol to see if this helps  improve his walking distance.  The patient is going to try to implement these plans and see me back in 3 months.  I also encouraged him to start taking a 81 mg aspirin.  I will leave it at the discretion of [name] regarding starting a statin, given his recent cholesterol profile     V. Leia Alf, M.D. [facility] Office: [phone] Pager:  [phone]

## 2013-12-05 ENCOUNTER — Ambulatory Visit (INDEPENDENT_AMBULATORY_CARE_PROVIDER_SITE_OTHER): Payer: Medicare Other | Admitting: Internal Medicine

## 2013-12-05 ENCOUNTER — Encounter: Payer: Self-pay | Admitting: Internal Medicine

## 2013-12-05 VITALS — BP 160/90 | HR 79 | Temp 98.1°F | Ht 71.0 in | Wt 269.0 lb

## 2013-12-05 DIAGNOSIS — I70219 Atherosclerosis of native arteries of extremities with intermittent claudication, unspecified extremity: Secondary | ICD-10-CM | POA: Diagnosis not present

## 2013-12-05 DIAGNOSIS — M545 Low back pain, unspecified: Secondary | ICD-10-CM | POA: Diagnosis not present

## 2013-12-05 DIAGNOSIS — J4489 Other specified chronic obstructive pulmonary disease: Secondary | ICD-10-CM

## 2013-12-05 DIAGNOSIS — R51 Headache: Secondary | ICD-10-CM

## 2013-12-05 DIAGNOSIS — J449 Chronic obstructive pulmonary disease, unspecified: Secondary | ICD-10-CM | POA: Diagnosis not present

## 2013-12-05 DIAGNOSIS — E119 Type 2 diabetes mellitus without complications: Secondary | ICD-10-CM | POA: Diagnosis not present

## 2013-12-05 DIAGNOSIS — N529 Male erectile dysfunction, unspecified: Secondary | ICD-10-CM

## 2013-12-05 DIAGNOSIS — G8929 Other chronic pain: Secondary | ICD-10-CM

## 2013-12-05 MED ORDER — TRAMADOL HCL 50 MG PO TABS: 50.0000 mg | ORAL_TABLET | Freq: Three times a day (TID) | ORAL | Status: DC | PRN

## 2013-12-05 MED ORDER — AVANAFIL 100 MG PO TABS: ORAL_TABLET | ORAL | Status: DC

## 2013-12-05 MED ORDER — TIZANIDINE HCL 4 MG PO TABS: 4.0000 mg | ORAL_TABLET | Freq: Four times a day (QID) | ORAL | Status: DC | PRN

## 2013-12-05 NOTE — Progress Notes (Signed)
Subjective:    Patient ID: Brad [name], male    DOB: 10-17-1955, 58 y.o.   MRN: [id number]  HPI  Here to f/u, headaches well controlled with pamelor tx, Pt denies chest pain, increased sob or doe, wheezing, orthopnea, PND, increased LE swelling, palpitations, dizziness or syncope.  Pt denies new neurological symptoms such as new headache, or facial or extremity weakness or numbness   Pt denies polydipsia, polyuria.  Does have worsening ED symptoms in last year.  Spiriva seems to help, wants to continue.  Does have also right lower thoracic pain, mild to mod, intermittent, x 2 wks, worse to bend and twist, worse with cough. Past Medical History  Diagnosis Date  . Depression   . GERD (gastroesophageal reflux disease)   . Diabetes mellitus type II   . COPD (chronic obstructive pulmonary disease)   . Ventral hernia   . Chronic LBP    Past Surgical History  Procedure Laterality Date  . Neg stress test  2000  . Rotator cuff repair  2009    multiple f/u Sxs/infection Tx  . Incision and drainage perirectal abscess  03/2009    reports that he has been smoking Cigarettes.  He has been smoking about 0.00 packs per day for the past 40 years. He has never used smokeless tobacco. He reports that he drinks alcohol. He reports that he does not use illicit drugs. family history includes Alcohol abuse in his father; Diabetes type II in his mother; Heart disease in his mother; Lung cancer in his father, maternal grandmother, and sister. Allergies  Allergen Reactions  . Doxycycline Hives and Itching  . Oxycodone Itching   Current Outpatient Prescriptions on File Prior to Visit  Medication Sig Dispense Refill  . albuterol (PROVENTIL HFA;VENTOLIN HFA) 108 (90 BASE) MCG/ACT inhaler Inhale 2 puffs into the lungs every 6 (six) hours as needed for wheezing or shortness of breath.  1 Inhaler  11  . Aspirin-Acetaminophen-Caffeine (GOODY HEADACHE PO) Take 1 packet by mouth 2 (two) times daily as needed.  For pain        . cilostazol (PLETAL) 100 MG tablet Take 1 tablet (100 mg total) by mouth 2 (two) times daily.  60 tablet  3  . glipiZIDE (GLIPIZIDE XL) 2.5 MG 24 hr tablet Take 1 tablet (2.5 mg total) by mouth daily with breakfast.  90 tablet  3  . glucose blood (ONE TOUCH ULTRA TEST) test strip Use as directed once daily to check blood sugar.  Diagnosis code 250.00  100 each  11  . metFORMIN (GLUCOPHAGE-XR) 500 MG 24 hr tablet Take 2 tablets (1,000 mg total) by mouth daily with breakfast.  180 tablet  3  . nortriptyline (PAMELOR) 10 MG capsule Take 1 capsule (10 mg total) by mouth at bedtime.  90 capsule  5  . ONE TOUCH LANCETS MISC Use as directed once daily to check blood sugar.  Diagnosis code 250.00  200 each  11  . tiotropium (SPIRIVA HANDIHALER) 18 MCG inhalation capsule Place 1 capsule (18 mcg total) into inhaler and inhale daily.  30 capsule  12   No current facility-administered medications on file prior to visit.   Review of Systems  Constitutional: Negative for unusual diaphoresis or other sweats  HENT: Negative for ringing in ear Eyes: Negative for double vision or worsening visual disturbance.  Respiratory: Negative for choking and stridor.   Gastrointestinal: Negative for vomiting or other signifcant bowel change Genitourinary: Negative for hematuria or decreased urine volume.  Musculoskeletal: Negative for other MSK pain or swelling Skin: Negative for color change and worsening wound.  Neurological: Negative for tremors and numbness other than noted  Psychiatric/Behavioral: Negative for decreased concentration or agitation other than above       Objective:   Physical Exam BP 160/90  Pulse 79  Temp(Src) 98.1 F (36.7 C) (Oral)  Ht 5\' 11"  (1.803 m)  Wt 269 lb (122.018 kg)  BMI 37.53 kg/m2  SpO2 96% VS noted, not ill appearing Constitutional: Pt appears well-developed, well-nourished.  HENT: Head: NCAT.  Right Ear: External ear normal.  Left Ear: External ear  normal.  Eyes: . Pupils are equal, round, and reactive to light. Conjunctivae and EOM are normal Neck: Normal range of motion. Neck supple.  Cardiovascular: Normal rate and regular rhythm.   Pulmonary/Chest: Effort normal and breath sounds normal.  Abd:  Soft, NT, ND, + BS Spine nontender Has mild to mod right lower thoracic tender ? Muscular spasm, no rash, no red, sweling Neurological: Pt is alert. Not confused , motor grossly intact, gait intact Skin: Skin is warm. No rash Psychiatric: Pt behavior is normal. No agitation.     Assessment & Plan:

## 2013-12-05 NOTE — Assessment & Plan Note (Signed)
Improved symptomatically - stable overall by history and exam, recent data reviewed with pt, and pt to continue medical treatment as before,  to f/u any worsening symptoms or concerns SpO2 Readings from Last 3 Encounters:  12/05/13 96%  10/10/13 95%  01/22/13 99%

## 2013-12-05 NOTE — Assessment & Plan Note (Signed)
For strendra sample today, rx if helps

## 2013-12-05 NOTE — Assessment & Plan Note (Signed)
With acute flare lower right thoracic pain/tender ? msk spasm, for pain control, muscle relaxer, neuro no change

## 2013-12-05 NOTE — Patient Instructions (Signed)
Please take all new medication as prescribed - the tramadol and muscle relaxer  You are given the stendra sample to use 1 per day AS Needed only, and the prescription  Please continue all other medications as before, and refills have been done if requested. Please have the pharmacy call with any other refills you may need.  Please continue your efforts at being more active, low cholesterol diet, and weight control.  Please consider seeing Dermatology again for the facial rash  Please return in 3 months, or sooner if needed

## 2013-12-05 NOTE — Progress Notes (Signed)
Pre visit review using our clinic review tool, if applicable. No additional management support is needed unless otherwise documented below in the visit note. 

## 2013-12-05 NOTE — Assessment & Plan Note (Signed)
Improve, stable overall by history and exam, recent data reviewed with pt, and pt to continue medical treatment as before,  to f/u any worsening symptoms or concerns Lab Results  Component Value Date   HGBA1C 9.1* 10/10/2013

## 2013-12-05 NOTE — Assessment & Plan Note (Signed)
Ok to [employer],   to f/u any worsening symptoms or concerns

## 2014-01-05 ENCOUNTER — Other Ambulatory Visit: Payer: Self-pay | Admitting: Internal Medicine

## 2014-01-06 NOTE — Telephone Encounter (Signed)
Done hardcopy to robin  

## 2014-01-07 NOTE — Telephone Encounter (Signed)
Faxed hardcopy to [facility] [address] [place] 

## 2014-01-19 ENCOUNTER — Encounter: Payer: Self-pay | Admitting: Internal Medicine

## 2014-01-19 ENCOUNTER — Ambulatory Visit (INDEPENDENT_AMBULATORY_CARE_PROVIDER_SITE_OTHER): Payer: Medicare Other | Admitting: Internal Medicine

## 2014-01-19 VITALS — BP 152/86 | HR 79 | Temp 97.9°F | Wt 265.8 lb

## 2014-01-19 DIAGNOSIS — L03211 Cellulitis of face: Secondary | ICD-10-CM

## 2014-01-19 DIAGNOSIS — R946 Abnormal results of thyroid function studies: Secondary | ICD-10-CM | POA: Insufficient documentation

## 2014-01-19 DIAGNOSIS — E1165 Type 2 diabetes mellitus with hyperglycemia: Secondary | ICD-10-CM

## 2014-01-19 DIAGNOSIS — Z23 Encounter for immunization: Secondary | ICD-10-CM | POA: Diagnosis not present

## 2014-01-19 DIAGNOSIS — IMO0001 Reserved for inherently not codable concepts without codable children: Secondary | ICD-10-CM

## 2014-01-19 DIAGNOSIS — L0201 Cutaneous abscess of face: Secondary | ICD-10-CM | POA: Diagnosis not present

## 2014-01-19 DIAGNOSIS — E669 Obesity, unspecified: Secondary | ICD-10-CM | POA: Insufficient documentation

## 2014-01-19 DIAGNOSIS — I70219 Atherosclerosis of native arteries of extremities with intermittent claudication, unspecified extremity: Secondary | ICD-10-CM | POA: Diagnosis not present

## 2014-01-19 MED ORDER — AMOXICILLIN-POT CLAVULANATE 875-125 MG PO TABS: 1.0000 | ORAL_TABLET | Freq: Two times a day (BID) | ORAL | Status: DC

## 2014-01-19 MED ORDER — MUPIROCIN 2 % EX OINT: TOPICAL_OINTMENT | CUTANEOUS | Status: DC

## 2014-01-19 NOTE — Progress Notes (Signed)
   Subjective:    Patient ID: Brad [name], male    DOB: Jul 23, 1956, 58 y.o.   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 The symptom began has small bump that has grown into a hard nodule. The pt reports he has a history of recurring swollen "bumps" on his face that seem to come back in the same location on the L side of his face. He has seen a dermatologist for this issue in the past, approximately 3 years ago. He was told that the bumps were due to shaving and bacteria on his face. Since this time the pt reports he uses a microbeaded face wash and puts alcohol on his face after he shaves to help decrease the bacteria. The pt has not shaved in the past 2 months until this morning.   The current "bump" has gotten larger over the past 2 weeks and is causing discomfort. The pt describes the discomfort as a throbbing pain that is constant. The pain radiates down his neck and he states goes "down to the bone." Last night the pt reports he stuck a needle in the bump and pus came out. The pt has tried cortisone cream but had no relief. He is taking "goody's" for his headaches as well as for the associated pain from the bump. Of significance the pt reports a fever of 101F last night.    Review of Systems  HENT: Negative for trouble swallowing.        No swelling of lips and tongue   Respiratory: Negative for shortness of breath.   Hematological: Positive for adenopathy.       Objective:   Physical Exam Gen.: Healthy and well-nourished in appearance. Alert, appropriate and cooperative throughout exam. Pt is overweight.   Lungs: Normal respiratory effort; chest expands symmetrically. Lungs are clear to auscultation.  Heart: Normal rate and rhythm. Normal S1 and S2. No gallop, click, or rub. No murmur.   Skin: erythematous nodule inferior to L auricle.   Lymph: No cervical, axillary lymphadenopathy present.                                                                 Assessment & Plan:  #1 abscess; most likely due to folliculitis. Will treat with bactrim or keflex. Will give topical mupirocin.

## 2014-01-19 NOTE — Progress Notes (Signed)
Pre visit review using our clinic review tool, if applicable. No additional management support is needed unless otherwise documented below in the visit note. 

## 2014-01-19 NOTE — Progress Notes (Signed)
Subjective:    Patient ID: Brad [name], male    DOB: 1956/01/06, 58 y.o.   MRN: [id number]  HPI    He's had left-sided facial swelling for 2 weeks; this is in the context of recurrent "bumps" in this area for which he has seen a dermatologist. The dermatologist told him was related to infections from shaving. He had not shaved for 2 months until this morning.  Over the last 2 weeks there has been enlargement of swelling over the left posterior masseter area. He cleaned a needle with alcohol and punctured the mass. He did note the production of pus. He describes persistent pain and swelling in the left side of his neck.  He has tried cortisone cream on the mass w/o benefit.  He had a temperature of 101 last night.  He's using Goody's  for headaches for the pain with good relief. He does have a history of intolerance to oxycodone as well as doxycycline.  The pain at the site is described as constant throbbing .  He had surgery on the right shoulder several  years ago and required IV antibiotics for apparent secondary infection. He denies any history of MRSA.  FBS > 200. A1c 9.1%; TG > 400.    Review of Systems He has no difficulty with mastication or difficulty swallowing. There's been no swelling of the lips or tongue  He describes subjective cervical lymphadenopathy.   He denies swelling of the lips or tongue or other signs or symptoms of angioedema.  He's had no shortness of breath, cough, or sputum production.      Objective:   Physical Exam  Significant or distinguishing  findings on physical exam are documented first.  Below that are other systems examined & findings.  He appears adequately nourished in no distress. Central weight excess is present. No sign of conjunctivitis is present. He has a pterygium on the left. Extraocular motion and vision are intact. He has an upper and plate lower partial. There is full range of motion of the left jaw and neck. There  is indurated non-cellulitic, nonfluctuant 4 x 3.5 cm subcutaneous changes on the left. There is no evidence of pustule. No cervical lymphadenopathy is appreciated. He also has no axillary lymphadenopathy. Chest is clear; breath sounds are slightly decreased. Regular rhythm without murmur murmur gallop    Head: Normocephalic without obvious abnormalities Eyes: Pupils equal round reactive to light and accommodation. Extraocular motion intact.  Ears: External  ear exam reveals no significant lesions or deformities. Canals clear .TMs normal. Hearing is grossly normal bilaterally. Nose: External nasal exam reveals no deformity or inflammation. Nasal mucosa are pink and moist. No lesions or exudates noted.   Mouth: Oral mucosa and oropharynx reveal no lesions or exudates.   Neck: No deformities, masses, or tenderness noted.  Thyroid  Normal. No increased work of breathing. Musculoskeletal/extremities: No deformity or scoliosis noted of  the thoracic or lumbar spine. No clubbing, cyanosis, edema, or significant extremity  deformity noted. Fingernail health good. Vascular: Carotid, radial artery pulses are full and equal. No bruits present. Neurologic: Alert and oriented x3.  Gait normal  .      Skin: Intact without suspicious lesions or rashes otherwise. Psych: Mood and affect are normal. Normally interactive  Assessment & Plan:  #1 probable left facial abscess. At this time no fluctuance or pustule formation is suggested. No clinical aggressive facial cellulitis.  #2 uncontrolled diabetes with A1c of 9.1%. Risks discussed.  #3 elevated TSH  Plan: See orders and recommendations. He was specifically told to go to emergency room if there were progressive swelling despite the  interventions.  If the facial lesion  progresses; ENT referral indicated.

## 2014-01-19 NOTE — Patient Instructions (Addendum)
To ER if pain persists or is associaled with Warning Signs ;worrisome would be change in color or size, increased pain, fever, or pus production. Use warm moist compresses 2 to 3 times a day to the involved area.  The most common cause of elevated triglycerides (TG) is the ingestion of sugar from high fructose corn syrup sources added to processed foods & drinks.  Eat a low-fat diet with lots of fruits and vegetables, up to 7-9 servings per day. Consume less than 40 Grams (preferably ZERO) of sugar per day from foods & drinks with High Fructose Corn Syrup (HFCS) sugar as #1,2,3 or # 4 on label.[employer], [employer] do not carry products with HFCS. Follow a  low carb nutrition program such as [place] or The [employer] Sugar [employer]  to prevent Diabetes progression . White carbohydrates (potatoes, rice, bread, and pasta) have a high spike of sugar and a high load of sugar. For example a  baked potato has a cup of sugar and a  french fry  2 teaspoons of sugar. Yams, wild  rice, whole grained bread &  wheat pasta have been much lower spike and load of  sugar. Portions should be the size of a deck of cards or your palm.

## 2014-01-20 ENCOUNTER — Telehealth: Payer: Self-pay

## 2014-01-20 ENCOUNTER — Telehealth: Payer: Self-pay | Admitting: Internal Medicine

## 2014-01-20 ENCOUNTER — Other Ambulatory Visit (INDEPENDENT_AMBULATORY_CARE_PROVIDER_SITE_OTHER): Payer: Medicare Other

## 2014-01-20 DIAGNOSIS — L0201 Cutaneous abscess of face: Secondary | ICD-10-CM | POA: Diagnosis not present

## 2014-01-20 DIAGNOSIS — R946 Abnormal results of thyroid function studies: Secondary | ICD-10-CM | POA: Diagnosis not present

## 2014-01-20 DIAGNOSIS — L03211 Cellulitis of face: Secondary | ICD-10-CM

## 2014-01-20 LAB — CBC WITH DIFFERENTIAL/PLATELET
Basophils Absolute: 0.1 10*3/uL (ref 0.0–0.1)
Basophils Relative: 1.7 % (ref 0.0–3.0)
Eosinophils Absolute: 0.2 10*3/uL (ref 0.0–0.7)
Eosinophils Relative: 3 % (ref 0.0–5.0)
HCT: 43.4 % (ref 39.0–52.0)
Hemoglobin: 14.6 g/dL (ref 13.0–17.0)
Lymphocytes Relative: 22.2 % (ref 12.0–46.0)
Lymphs Abs: 1.7 10*3/uL (ref 0.7–4.0)
MCHC: 33.7 g/dL (ref 30.0–36.0)
MCV: 77.1 fl — ABNORMAL LOW (ref 78.0–100.0)
Monocytes Absolute: 0.7 10*3/uL (ref 0.1–1.0)
Monocytes Relative: 8.7 % (ref 3.0–12.0)
Neutro Abs: 5 10*3/uL (ref 1.4–7.7)
Neutrophils Relative %: 64.4 % (ref 43.0–77.0)
Platelets: 239 10*3/uL (ref 150.0–400.0)
RBC: 5.64 Mil/uL (ref 4.22–5.81)
RDW: 16.3 % — ABNORMAL HIGH (ref 11.5–15.5)
WBC: 7.7 10*3/uL (ref 4.0–10.5)

## 2014-01-20 LAB — TSH: TSH: 8.27 u[IU]/mL — ABNORMAL HIGH (ref 0.35–4.50)

## 2014-01-20 LAB — T3, FREE: T3, Free: 2.7 pg/mL (ref 2.3–4.2)

## 2014-01-20 LAB — T4, FREE: Free T4: 0.61 ng/dL (ref 0.60–1.60)

## 2014-01-20 NOTE — Telephone Encounter (Signed)
Relevant patient education mailed to patient.  

## 2014-01-20 NOTE — Telephone Encounter (Signed)
[facility] called for re entering orders for cbc diff, t3, t4 and tsh

## 2014-01-24 ENCOUNTER — Other Ambulatory Visit: Payer: Self-pay | Admitting: Internal Medicine

## 2014-01-24 DIAGNOSIS — E039 Hypothyroidism, unspecified: Secondary | ICD-10-CM

## 2014-01-24 MED ORDER — LEVOTHYROXINE SODIUM 50 MCG PO TABS: 50.0000 ug | ORAL_TABLET | Freq: Every day | ORAL | Status: DC

## 2014-02-02 ENCOUNTER — Telehealth: Payer: Self-pay | Admitting: Internal Medicine

## 2014-02-02 MED ORDER — LEVOTHYROXINE SODIUM 50 MCG PO TABS: 50.0000 ug | ORAL_TABLET | Freq: Every day | ORAL | Status: DC

## 2014-02-02 NOTE — Telephone Encounter (Signed)
Pt has labs in June that showed his thyroid was low.  He thought some medicine was to be called in to the [facility] on [address] for his thyroid.  Pt saw Dr. Linna Darner in mid June.

## 2014-02-02 NOTE — Telephone Encounter (Signed)
Rx sent 01/24/14; please verify received

## 2014-02-02 NOTE — Telephone Encounter (Signed)
Resent rx, first script was printed. Notified pt resent to [facility]...Brad [name]

## 2014-02-10 ENCOUNTER — Telehealth: Payer: Self-pay | Admitting: *Deleted

## 2014-02-10 DIAGNOSIS — IMO0001 Reserved for inherently not codable concepts without codable children: Secondary | ICD-10-CM

## 2014-02-10 DIAGNOSIS — E1165 Type 2 diabetes mellitus with hyperglycemia: Secondary | ICD-10-CM

## 2014-02-10 DIAGNOSIS — E119 Type 2 diabetes mellitus without complications: Secondary | ICD-10-CM

## 2014-02-10 NOTE — Telephone Encounter (Signed)
Patient has an appointment A1c, bmet ordered Diabetic bundle

## 2014-02-28 ENCOUNTER — Inpatient Hospital Stay (HOSPITAL_COMMUNITY)
Admission: EM | Admit: 2014-02-28 | Discharge: 2014-03-01 | DRG: 065 | Disposition: A | Discharge status: Home / Self Care | Payer: Medicare Other | Attending: Internal Medicine | Admitting: Internal Medicine

## 2014-02-28 ENCOUNTER — Telehealth: Payer: Self-pay

## 2014-02-28 ENCOUNTER — Encounter (HOSPITAL_COMMUNITY): Payer: Self-pay | Admitting: Emergency Medicine

## 2014-02-28 ENCOUNTER — Emergency Department (HOSPITAL_COMMUNITY): Payer: Medicare Other

## 2014-02-28 ENCOUNTER — Inpatient Hospital Stay (HOSPITAL_COMMUNITY): Payer: Medicare Other

## 2014-02-28 DIAGNOSIS — R51 Headache: Secondary | ICD-10-CM | POA: Diagnosis present

## 2014-02-28 DIAGNOSIS — I6350 Cerebral infarction due to unspecified occlusion or stenosis of unspecified cerebral artery: Secondary | ICD-10-CM | POA: Diagnosis present

## 2014-02-28 DIAGNOSIS — I6529 Occlusion and stenosis of unspecified carotid artery: Secondary | ICD-10-CM | POA: Diagnosis present

## 2014-02-28 DIAGNOSIS — I6359 Cerebral infarction due to unspecified occlusion or stenosis of other cerebral artery: Secondary | ICD-10-CM

## 2014-02-28 DIAGNOSIS — R21 Rash and other nonspecific skin eruption: Secondary | ICD-10-CM

## 2014-02-28 DIAGNOSIS — J449 Chronic obstructive pulmonary disease, unspecified: Secondary | ICD-10-CM | POA: Diagnosis not present

## 2014-02-28 DIAGNOSIS — I059 Rheumatic mitral valve disease, unspecified: Secondary | ICD-10-CM | POA: Diagnosis not present

## 2014-02-28 DIAGNOSIS — Z79899 Other long term (current) drug therapy: Secondary | ICD-10-CM | POA: Diagnosis not present

## 2014-02-28 DIAGNOSIS — R5383 Other fatigue: Secondary | ICD-10-CM

## 2014-02-28 DIAGNOSIS — J4489 Other specified chronic obstructive pulmonary disease: Secondary | ICD-10-CM | POA: Diagnosis present

## 2014-02-28 DIAGNOSIS — R269 Unspecified abnormalities of gait and mobility: Secondary | ICD-10-CM | POA: Diagnosis present

## 2014-02-28 DIAGNOSIS — I6789 Other cerebrovascular disease: Secondary | ICD-10-CM | POA: Diagnosis present

## 2014-02-28 DIAGNOSIS — R4789 Other speech disturbances: Secondary | ICD-10-CM | POA: Diagnosis present

## 2014-02-28 DIAGNOSIS — E039 Hypothyroidism, unspecified: Secondary | ICD-10-CM | POA: Diagnosis present

## 2014-02-28 DIAGNOSIS — M545 Low back pain, unspecified: Secondary | ICD-10-CM | POA: Diagnosis present

## 2014-02-28 DIAGNOSIS — R42 Dizziness and giddiness: Secondary | ICD-10-CM

## 2014-02-28 DIAGNOSIS — I639 Cerebral infarction, unspecified: Secondary | ICD-10-CM

## 2014-02-28 DIAGNOSIS — G8190 Hemiplegia, unspecified affecting unspecified side: Secondary | ICD-10-CM | POA: Diagnosis present

## 2014-02-28 DIAGNOSIS — K219 Gastro-esophageal reflux disease without esophagitis: Secondary | ICD-10-CM | POA: Diagnosis present

## 2014-02-28 DIAGNOSIS — F329 Major depressive disorder, single episode, unspecified: Secondary | ICD-10-CM

## 2014-02-28 DIAGNOSIS — I739 Peripheral vascular disease, unspecified: Secondary | ICD-10-CM | POA: Diagnosis present

## 2014-02-28 DIAGNOSIS — I6322 Cerebral infarction due to unspecified occlusion or stenosis of basilar arteries: Secondary | ICD-10-CM | POA: Diagnosis not present

## 2014-02-28 DIAGNOSIS — R229 Localized swelling, mass and lump, unspecified: Secondary | ICD-10-CM | POA: Diagnosis not present

## 2014-02-28 DIAGNOSIS — M79604 Pain in right leg: Secondary | ICD-10-CM

## 2014-02-28 DIAGNOSIS — R946 Abnormal results of thyroid function studies: Secondary | ICD-10-CM

## 2014-02-28 DIAGNOSIS — G8929 Other chronic pain: Secondary | ICD-10-CM

## 2014-02-28 DIAGNOSIS — E669 Obesity, unspecified: Secondary | ICD-10-CM | POA: Diagnosis present

## 2014-02-28 DIAGNOSIS — F3289 Other specified depressive episodes: Secondary | ICD-10-CM

## 2014-02-28 DIAGNOSIS — Z6836 Body mass index (BMI) 36.0-36.9, adult: Secondary | ICD-10-CM

## 2014-02-28 DIAGNOSIS — IMO0001 Reserved for inherently not codable concepts without codable children: Secondary | ICD-10-CM | POA: Diagnosis present

## 2014-02-28 DIAGNOSIS — E1165 Type 2 diabetes mellitus with hyperglycemia: Secondary | ICD-10-CM

## 2014-02-28 DIAGNOSIS — F17200 Nicotine dependence, unspecified, uncomplicated: Secondary | ICD-10-CM | POA: Diagnosis present

## 2014-02-28 DIAGNOSIS — M79605 Pain in left leg: Secondary | ICD-10-CM

## 2014-02-28 DIAGNOSIS — F32A Depression, unspecified: Secondary | ICD-10-CM | POA: Insufficient documentation

## 2014-02-28 DIAGNOSIS — K439 Ventral hernia without obstruction or gangrene: Secondary | ICD-10-CM

## 2014-02-28 DIAGNOSIS — Z8673 Personal history of transient ischemic attack (TIA), and cerebral infarction without residual deficits: Secondary | ICD-10-CM | POA: Diagnosis present

## 2014-02-28 DIAGNOSIS — Z0000 Encounter for general adult medical examination without abnormal findings: Secondary | ICD-10-CM

## 2014-02-28 DIAGNOSIS — R519 Headache, unspecified: Secondary | ICD-10-CM | POA: Diagnosis present

## 2014-02-28 DIAGNOSIS — I6302 Cerebral infarction due to thrombosis of basilar artery: Secondary | ICD-10-CM

## 2014-02-28 DIAGNOSIS — I672 Cerebral atherosclerosis: Secondary | ICD-10-CM | POA: Diagnosis not present

## 2014-02-28 DIAGNOSIS — R5381 Other malaise: Secondary | ICD-10-CM

## 2014-02-28 LAB — URINALYSIS, ROUTINE W REFLEX MICROSCOPIC
Bilirubin Urine: NEGATIVE
Glucose, UA: NEGATIVE mg/dL
Hgb urine dipstick: NEGATIVE
Ketones, ur: NEGATIVE mg/dL
Leukocytes, UA: NEGATIVE
Nitrite: NEGATIVE
Protein, ur: 100 mg/dL — AB
Specific Gravity, Urine: 1.01 (ref 1.005–1.030)
Urobilinogen, UA: 1 mg/dL (ref 0.0–1.0)
pH: 5 (ref 5.0–8.0)

## 2014-02-28 LAB — RAPID URINE DRUG SCREEN, HOSP PERFORMED
Amphetamines: NOT DETECTED
Barbiturates: NOT DETECTED
Benzodiazepines: NOT DETECTED
Cocaine: NOT DETECTED
Opiates: NOT DETECTED
Tetrahydrocannabinol: NOT DETECTED

## 2014-02-28 LAB — CBC
HCT: 36.1 % — ABNORMAL LOW (ref 39.0–52.0)
Hemoglobin: 12.1 g/dL — ABNORMAL LOW (ref 13.0–17.0)
MCH: 26.2 pg (ref 26.0–34.0)
MCHC: 33.5 g/dL (ref 30.0–36.0)
MCV: 78.1 fL (ref 78.0–100.0)
Platelets: 180 10*3/uL (ref 150–400)
RBC: 4.62 MIL/uL (ref 4.22–5.81)
RDW: 15.1 % (ref 11.5–15.5)
WBC: 5.2 10*3/uL (ref 4.0–10.5)

## 2014-02-28 LAB — BASIC METABOLIC PANEL
Anion gap: 12 (ref 5–15)
BUN: 8 mg/dL (ref 6–23)
CO2: 26 mEq/L (ref 19–32)
Calcium: 8.6 mg/dL (ref 8.4–10.5)
Chloride: 96 mEq/L (ref 96–112)
Creatinine, Ser: 0.77 mg/dL (ref 0.50–1.35)
GFR calc Af Amer: >90 mL/min (ref >90)
GFR calc non Af Amer: >90 mL/min (ref >90)
Glucose, Bld: 137 mg/dL — ABNORMAL HIGH (ref 70–99)
Potassium: 4.4 mEq/L (ref 3.7–5.3)
Sodium: 134 mEq/L — ABNORMAL LOW (ref 137–147)

## 2014-02-28 LAB — GLUCOSE, CAPILLARY
Glucose-Capillary: 125 mg/dL — ABNORMAL HIGH (ref 70–99)
Glucose-Capillary: 130 mg/dL — ABNORMAL HIGH (ref 70–99)

## 2014-02-28 LAB — ETHANOL: Alcohol, Ethyl (B): <11 mg/dL (ref 0–11)

## 2014-02-28 LAB — PROTIME-INR
INR: 1.03 (ref 0.00–1.49)
Prothrombin Time: 13.5 seconds (ref 11.6–15.2)

## 2014-02-28 LAB — I-STAT TROPONIN, ED: Troponin i, poc: 0 ng/mL (ref 0.00–0.08)

## 2014-02-28 LAB — URINE MICROSCOPIC-ADD ON

## 2014-02-28 LAB — APTT: aPTT: 33 seconds (ref 24–37)

## 2014-02-28 MED ORDER — ONDANSETRON HCL 4 MG/2ML IJ SOLN
4.0000 mg | Freq: Once | INTRAMUSCULAR | Status: AC
  Administered 2014-02-28: 4 mg via INTRAVENOUS
  Filled 2014-02-28: qty 2

## 2014-02-28 MED ORDER — NICOTINE 21 MG/24HR TD PT24
21.0000 mg | MEDICATED_PATCH | Freq: Every day | TRANSDERMAL | Status: DC
  Administered 2014-02-28 – 2014-03-01 (×2): 21 mg via TRANSDERMAL
  Filled 2014-02-28 (×2): qty 1

## 2014-02-28 MED ORDER — ALBUTEROL SULFATE (2.5 MG/3ML) 0.083% IN NEBU: 3.0000 mL | INHALATION_SOLUTION | Freq: Four times a day (QID) | RESPIRATORY_TRACT | Status: DC | PRN

## 2014-02-28 MED ORDER — ENOXAPARIN SODIUM 40 MG/0.4ML ~~LOC~~ SOLN
40.0000 mg | SUBCUTANEOUS | Status: DC
  Administered 2014-02-28: 40 mg via SUBCUTANEOUS
  Filled 2014-02-28: qty 0.4

## 2014-02-28 MED ORDER — LEVOTHYROXINE SODIUM 50 MCG PO TABS
50.0000 ug | ORAL_TABLET | Freq: Every day | ORAL | Status: DC
  Administered 2014-03-01: 50 ug via ORAL
  Filled 2014-02-28: qty 1

## 2014-02-28 MED ORDER — CILOSTAZOL 100 MG PO TABS
100.0000 mg | ORAL_TABLET | Freq: Every day | ORAL | Status: DC
  Administered 2014-03-01: 100 mg via ORAL
  Filled 2014-02-28: qty 1

## 2014-02-28 MED ORDER — ACETAMINOPHEN 325 MG PO TABS
650.0000 mg | ORAL_TABLET | ORAL | Status: DC | PRN
  Administered 2014-02-28: 650 mg via ORAL
  Filled 2014-02-28: qty 2

## 2014-02-28 MED ORDER — TRAMADOL HCL 50 MG PO TABS
50.0000 mg | ORAL_TABLET | Freq: Three times a day (TID) | ORAL | Status: DC | PRN
  Administered 2014-03-01 (×2): 50 mg via ORAL
  Filled 2014-02-28 (×2): qty 1

## 2014-02-28 MED ORDER — STROKE: EARLY STAGES OF RECOVERY BOOK
Freq: Once | Status: DC
  Filled 2014-02-28: qty 1

## 2014-02-28 MED ORDER — MUPIROCIN 2 % EX OINT
1.0000 "application " | TOPICAL_OINTMENT | Freq: Every day | CUTANEOUS | Status: DC
  Administered 2014-03-01: 1 via TOPICAL
  Filled 2014-02-28: qty 22

## 2014-02-28 MED ORDER — SENNOSIDES-DOCUSATE SODIUM 8.6-50 MG PO TABS: 1.0000 | ORAL_TABLET | Freq: Every evening | ORAL | Status: DC | PRN

## 2014-02-28 MED ORDER — ACETAMINOPHEN 650 MG RE SUPP: 650.0000 mg | RECTAL | Status: DC | PRN

## 2014-02-28 MED ORDER — SODIUM CHLORIDE 0.9 % IV SOLN
INTRAVENOUS | Status: DC
  Administered 2014-02-28: 17:00:00 via INTRAVENOUS

## 2014-02-28 MED ORDER — TIZANIDINE HCL 4 MG PO TABS
8.0000 mg | ORAL_TABLET | Freq: Four times a day (QID) | ORAL | Status: DC | PRN
Start: 2014-02-28 — End: 2014-03-01
  Administered 2014-03-01: 8 mg via ORAL
  Filled 2014-02-28 (×2): qty 2

## 2014-02-28 MED ORDER — SODIUM CHLORIDE 0.9 % IV SOLN
INTRAVENOUS | Status: DC
  Administered 2014-02-28: 75 mL/h via INTRAVENOUS

## 2014-02-28 MED ORDER — FENTANYL CITRATE 0.05 MG/ML IJ SOLN
50.0000 ug | Freq: Once | INTRAMUSCULAR | Status: AC
  Administered 2014-02-28: 50 ug via INTRAVENOUS
  Filled 2014-02-28: qty 2

## 2014-02-28 MED ORDER — INSULIN ASPART 100 UNIT/ML ~~LOC~~ SOLN
0.0000 [IU] | Freq: Three times a day (TID) | SUBCUTANEOUS | Status: DC
  Administered 2014-02-28: 1 [IU] via SUBCUTANEOUS
  Administered 2014-03-01: 2 [IU] via SUBCUTANEOUS

## 2014-02-28 NOTE — ED Notes (Signed)
Pt. Stated, i woke up this morning and noticed i was dizzy especially when I stand up and I felt like I had some slurred speech.  i was fine when I went to bed.

## 2014-02-28 NOTE — ED Notes (Signed)
PA Tiffany at the bedside.

## 2014-02-28 NOTE — H&P (Signed)
History and Physical  Brad [name] VZC:[id number] DOB: 03/21/1956 DOA: 02/28/2014  Referring physician: Delos Haring, ED PA PCP: [name], MD  Outpatient Specialists:  1. None  Chief Complaint: Dizziness, speech abnormality & unsteady gait.  HPI: Brad [name] is a 58 y.o. male with history of type II DM, COPD, GERD, chronic low back pain, chronic intermittent headaches, PAD of right lower extremity, ongoing tobacco abuse, status post right shoulder surgery, presented to the ED with above complaints. He states that he went to bed at 9 PM last night without complaints. He woke up at 5 AM on 02/28/14 with complaints of dizziness, clumsiness, knocked down 2 glasses of water in an attempt to drink, unsteady gait, facial droop, slurred speech and right-sided weakness. No history of loss of consciousness or headache. Some of the symptoms have gradually improved-including slurred speech. In the ED, hemoglobin 12.1, glucose 137 and CT head shows no acute intracranial findings but shows remote appearing basal ganglia lacunar infarcts. Neurology was consulted by ED and recommend limited MRI brain and MRA brain. Patient was not on antithrombotic is prior to admission. Hospitalist admission requested for possible CVA.   Review of Systems: All systems reviewed and apart from history of presenting illness, are negative.  Past Medical History  Diagnosis Date  . Depression   . GERD (gastroesophageal reflux disease)   . Diabetes mellitus type II   . COPD (chronic obstructive pulmonary disease)   . Ventral hernia   . Chronic LBP    Past Surgical History  Procedure Laterality Date  . Neg stress test  2000  . Rotator cuff repair  2009    multiple f/u Sxs/infection Tx  . Incision and drainage perirectal abscess  03/2009   Social History:  reports that he has been smoking Cigarettes.  He has a 60 pack-year smoking history. He has never used smokeless tobacco. He reports that he drinks  alcohol. He reports that he does not use illicit drugs. Married. Lives with spouse and stepdaughter. Independent of activities of daily living.  Allergies  Allergen Reactions  . Doxycycline Hives and Itching  . Oxycodone Itching    Family History  Problem Relation Age of Onset  . Alcohol abuse Father   . Lung cancer Father   . Heart disease Mother     automated implantable cardioverter-defibrillator   . Diabetes type II Mother   . Lung cancer Maternal Grandmother   . Lung cancer Sister     Prior to Admission medications   Medication Sig Start Date End Date Taking? Authorizing Provider  albuterol (PROVENTIL HFA;VENTOLIN HFA) 108 (90 BASE) MCG/ACT inhaler Inhale 2 puffs into the lungs every 6 (six) hours as needed for wheezing or shortness of breath. 10/10/13  Yes Biagio Borg, MD  Aspirin-Acetaminophen-Caffeine (GOODY HEADACHE PO) Take 1 packet by mouth 2 (two) times daily as needed (for pain).    Yes Historical Provider, MD  cilostazol (PLETAL) 100 MG tablet Take 100 mg by mouth daily.   Yes Historical Provider, MD  glipiZIDE (GLIPIZIDE XL) 2.5 MG 24 hr tablet Take 1 tablet (2.5 mg total) by mouth daily with breakfast. 10/14/13  Yes Biagio Borg, MD  glucose blood test strip 1 each by Other route See admin instructions. Check blood sugar once daily.   Yes Historical Provider, MD  Lancets MISC 1 each by Other route See admin instructions. Check blood sugar once daily.   Yes Historical Provider, MD  levothyroxine (SYNTHROID, LEVOTHROID) 50 MCG tablet  Take 50 mcg by mouth daily before breakfast.   Yes Historical Provider, MD  metFORMIN (GLUCOPHAGE) 500 MG tablet Take 1,000 mg by mouth daily.   Yes Historical Provider, MD  mupirocin ointment (BACTROBAN) 2 % Apply 1 application topically daily.   Yes Historical Provider, MD  nortriptyline (PAMELOR) 10 MG capsule Take 1 capsule (10 mg total) by mouth at bedtime. 10/10/13  Yes Biagio Borg, MD  tiotropium (SPIRIVA) 18 MCG inhalation capsule Place  18 mcg into inhaler and inhale 2 (two) times daily as needed (for wheezing).   Yes Historical Provider, MD  tiZANidine (ZANAFLEX) 4 MG tablet Take 8 mg by mouth every 6 (six) hours as needed for muscle spasms.   Yes Historical Provider, MD  traMADol (ULTRAM) 50 MG tablet Take 50 mg by mouth every 8 (eight) hours as needed for moderate pain.   Yes Historical Provider, MD   Physical Exam: Filed Vitals:   02/28/14 1300 02/28/14 1330 02/28/14 1345 02/28/14 1415  BP: 142/63 175/72 168/84 182/72  Pulse: 58 62 52 52  Temp:      TempSrc:      Resp:      Height:      Weight:      SpO2: 99% 98% 99% 97%   respiratory rate 14 per minute and temperature 97.75F.   General exam: Moderately built and nourished middle-aged male patient, lying comfortably propped up on the gurney in no obvious distress.  Head, eyes and ENT: Nontraumatic and normocephalic. Pupils equally reacting to light and accommodation. Oral mucosa moist.  Neck: Supple. No JVD, carotid bruit or thyromegaly.  Lymphatics: No lymphadenopathy.  Respiratory system: Clear to auscultation. No increased work of breathing.  Cardiovascular system: S1 and S2 heard, RRR. No JVD, murmurs, gallops, clicks or pedal edema.  Gastrointestinal system: Abdomen is nondistended, soft and nontender. Normal bowel sounds heard. No organomegaly or masses appreciated.  Central nervous system: Alert and oriented x3. Facial asymmetry +: Diminished right and prominent left nasolabial fold, expressive aphasia, mild dysarthria.   Extremities: Peripheral pulses symmetrically felt. Limited evaluation of a right upper extremity proximal strength secondary to surgical issues. Distally in right upper extremity hand grip grade 4+/5. Right lower extremity grade 4+/5. Left extremities grade 5 x 5 power.  Skin: No rashes or acute findings.  Musculoskeletal system: Negative exam. Surgical scar over right shoulder.  Psychiatry: Pleasant and cooperative.   Labs on  Admission:  Basic Metabolic Panel:  Recent Labs Lab 02/28/14 1035  NA 134*  K 4.4  CL 96  CO2 26  GLUCOSE 137*  BUN 8  CREATININE 0.77  CALCIUM 8.6   Liver Function Tests: No results found for this basename: AST, ALT, ALKPHOS, BILITOT, PROT, ALBUMIN,  in the last 168 hours No results found for this basename: LIPASE, AMYLASE,  in the last 168 hours No results found for this basename: AMMONIA,  in the last 168 hours CBC:  Recent Labs Lab 02/28/14 1035  WBC 5.2  HGB 12.1*  HCT 36.1*  MCV 78.1  PLT 180   Cardiac Enzymes: No results found for this basename: CKTOTAL, CKMB, CKMBINDEX, TROPONINI,  in the last 168 hours  BNP (last 3 results) No results found for this basename: PROBNP,  in the last 8760 hours CBG: No results found for this basename: GLUCAP,  in the last 168 hours  Radiological Exams on Admission: Ct Head Wo Contrast  02/28/2014   CLINICAL DATA:  Dizziness, unsteady gait, ectasia  EXAM: CT HEAD WITHOUT CONTRAST  TECHNIQUE: Contiguous axial images were obtained from the base of the skull through the vertex without contrast.  COMPARISON:  None  FINDINGS: Small punctate bilateral basal ganglia lacunar-type infarcts, images 14 and 16. No acute intracranial hemorrhage, definite acute infarction, mass lesion, midline shift, herniation, hydrocephalus, or extra-axial fluid collection. No mass effect or focal edema. Normal gray-white matter differentiation. Cisterns are patent. No cerebellar abnormality. Atherosclerosis of the intracranial vessels. Mastoids clear. Minor ethmoid mucosal thickening otherwise sinuses clear. Symmetric orbits.  IMPRESSION: Remote appearing basal ganglia small lacunar infarcts. No acute intracranial finding by noncontrast CT.   Electronically Signed   By: Daryll Brod M.D.   On: 02/28/2014 13:11    EKG: Independently reviewed. Sinus rhythm, normal axis, incomplete RBBB, mild ST depression inferior leads and no acute  findings.  Assessment/Plan Principal Problem:   CVA (cerebral infarction) Active Problems:   Type II or unspecified type diabetes mellitus without mention of complication, uncontrolled   COPD   GERD   Chronic LBP   Headache(784.0)   Obesity (BMI 30-39.9)   1. Possible CVA with right hemiparesis: Admit to telemetry. Complete stroke workup including MRI and MRA brain, carotid Dopplers, 2-D echo, fasting lipids and hemoglobin A1c. Not candidate for TPA due to unknown time of onset. Not on antithrombotic is prior to admission. Start aspirin 325 mg daily for secondary stroke prophylaxis. Patient passed bedside swallow screen. Tobacco cessation counseled. PT, OT and ST evaluation. Neurology has been consulted. 2. Tobacco abuse: Cessation counseled. Nicotine patch. 3. COPD: Stable. Tobacco cessation counseled. 4. Hypothyroid: Continue Synthroid. 5. Type II DM: Hold oral hypoglycemics. SSI. Check hemoglobin A1c. 6. PAD right lower extremity: Continue Pletal. 7. Chronic intermittent headache and chronic back pain: Continue home pain medication regimen.     Code Status: Full  Family Communication: None at bedside.  Disposition Plan: Home when medically stable.   Time spent: 67 minutes  Kollin Udell, MD, FACP, FHM. [facility] Pager [phone]  If 7PM-7AM, please contact night-coverage [email or web address] Password [facility] 02/28/2014, 2:46 PM

## 2014-02-28 NOTE — ED Provider Notes (Signed)
Patient seen/examined in the Emergency Department in conjunction with Midlevel Provider [place] Patient reports dizziness/slurred speech. Last known well last night Exam : awake/alert, right LE drift noted.  Pt reports feeling dizziness as well Plan: suspect CVA.  Will admit  tPA in stroke considered but not given due to:  Onset over 3-4.5hours     Sharyon Cable, MD 02/28/14 [id number]

## 2014-02-28 NOTE — ED Notes (Signed)
Patient returned from CT

## 2014-02-28 NOTE — ED Notes (Signed)
Attempted to call report x 1  

## 2014-02-28 NOTE — ED Notes (Signed)
MD Wickline at the bedside. Made aware of patient's symptoms.

## 2014-02-28 NOTE — ED Provider Notes (Signed)
Medical screening examination/treatment/procedure(s) were conducted as a shared visit with non-physician practitioner(s) and myself.  I personally evaluated the patient during the encounter.   EKG Interpretation   Date/Time:  Saturday February 28 2014 10:25:25 EDT Ventricular Rate:  73 PR Interval:  162 QRS Duration: 104 QT Interval:  434 QTC Calculation: 478 R Axis:   35 Text Interpretation:  Normal sinus rhythm Incomplete right bundle branch  block Septal infarct , age undetermined Abnormal ECG No significant change  since last tracing Confirmed by Christy Gentles  MD, Keela Rubert ([id number]) on 02/28/2014  11:27:35 AM        Sharyon Cable, MD 02/28/14 1538

## 2014-02-28 NOTE — ED Notes (Signed)
Pt. Stated, i also have a clot in my left leg that's been treated.

## 2014-02-28 NOTE — ED Provider Notes (Signed)
CSN: [id number]     Arrival date & time 02/28/14  1021 History   First MD Initiated Contact with Patient 02/28/14 1110     Chief Complaint  Patient presents with  . Dizziness  . Aphasia     (Consider location/radiation/quality/duration/timing/severity/associated sxs/prior Treatment) HPI   Dr. [name]- PCP  Patient to the ED with complaints of dizziness, aphagia and "clumsiness". He went to be normal last night and the symptoms were present upon waking this morning. He was tripping and acting very clumsy per wife and patient. Wife reports he said a few words incorrectly which was very abnormal for him. He feels weakness to the right side, dizzy when walking which is still present. He is no longer having slurred speech. He denies history of stroke but does have a PMH of depression, diabetes, GERD, COPD and pt reports he has circulation problems to his lower extremities for which he takes Cilostazol. Per medical hx no mention of blood clots in the legs and he does not take any blood thinners.     Past Medical History  Diagnosis Date  . Depression   . GERD (gastroesophageal reflux disease)   . Diabetes mellitus type II   . COPD (chronic obstructive pulmonary disease)   . Ventral hernia   . Chronic LBP    Past Surgical History  Procedure Laterality Date  . Neg stress test  2000  . Rotator cuff repair  2009    multiple f/u Sxs/infection Tx  . Incision and drainage perirectal abscess  03/2009   Family History  Problem Relation Age of Onset  . Alcohol abuse Father   . Lung cancer Father   . Heart disease Mother     automated implantable cardioverter-defibrillator   . Diabetes type II Mother   . Lung cancer Maternal Grandmother   . Lung cancer Sister    History  Substance Use Topics  . Smoking status: Current Every Day Smoker -- 40 years    Types: Cigarettes  . Smokeless tobacco: Never Used     Comment: 1-2 pks per day  . Alcohol Use: Yes     Comment: Social     Review of Systems   Review of Systems  Gen: no weight loss, fevers, chills, night sweats  Eyes: no discharge or drainage, no occular pain or visual changes  Nose: no epistaxis or rhinorrhea  Mouth: no dental pain, no sore throat  Neck: no neck pain  Lungs:No wheezing or hemoptysis No coughing CV:  No palpitations, dependent edema or orthopnea. No chest pain Abd: no diarrhea. No nausea or vomiting, No abdominal pain  GU: no dysuria or gross hematuria  MSK:  No muscle weakness, No  pain Neuro: no headache, + focal neurologic deficits, dizzy, and aphagia. Skin: no rash , no wounds Psyche: no complaints     Allergies  Doxycycline and Oxycodone  Home Medications   Prior to Admission medications   Medication Sig Start Date End Date Taking? Authorizing Provider  albuterol (PROVENTIL HFA;VENTOLIN HFA) 108 (90 BASE) MCG/ACT inhaler Inhale 2 puffs into the lungs every 6 (six) hours as needed for wheezing or shortness of breath. 10/10/13  Yes Biagio Borg, MD  Aspirin-Acetaminophen-Caffeine (GOODY HEADACHE PO) Take 1 packet by mouth 2 (two) times daily as needed (for pain).    Yes Historical Provider, MD  cilostazol (PLETAL) 100 MG tablet Take 100 mg by mouth daily.   Yes Historical Provider, MD  glipiZIDE (GLIPIZIDE XL) 2.5 MG 24 hr tablet Take  1 tablet (2.5 mg total) by mouth daily with breakfast. 10/14/13  Yes Biagio Borg, MD  glucose blood test strip 1 each by Other route See admin instructions. Check blood sugar once daily.   Yes Historical Provider, MD  Lancets MISC 1 each by Other route See admin instructions. Check blood sugar once daily.   Yes Historical Provider, MD  levothyroxine (SYNTHROID, LEVOTHROID) 50 MCG tablet Take 50 mcg by mouth daily before breakfast.   Yes Historical Provider, MD  metFORMIN (GLUCOPHAGE) 500 MG tablet Take 1,000 mg by mouth daily.   Yes Historical Provider, MD  mupirocin ointment (BACTROBAN) 2 % Apply 1 application topically daily.   Yes Historical  Provider, MD  nortriptyline (PAMELOR) 10 MG capsule Take 1 capsule (10 mg total) by mouth at bedtime. 10/10/13  Yes Biagio Borg, MD  tiotropium (SPIRIVA) 18 MCG inhalation capsule Place 18 mcg into inhaler and inhale 2 (two) times daily as needed (for wheezing).   Yes Historical Provider, MD  tiZANidine (ZANAFLEX) 4 MG tablet Take 8 mg by mouth every 6 (six) hours as needed for muscle spasms.   Yes Historical Provider, MD  traMADol (ULTRAM) 50 MG tablet Take 50 mg by mouth every 8 (eight) hours as needed for moderate pain.   Yes Historical Provider, MD   BP 145/67  Pulse 60  Temp(Src) 97.4 F (36.3 C) (Oral)  Resp 14  Ht 5\' 11"  (1.803 m)  Wt 260 lb (117.935 kg)  BMI 36.28 kg/m2  SpO2 98% Physical Exam  Nursing note and vitals reviewed. Constitutional: He is oriented to person, place, and time. He appears well-developed and well-nourished. No distress.  HENT:  Head: Normocephalic and atraumatic.  Eyes: Pupils are equal, round, and reactive to light.  Neck: Normal range of motion. Neck supple.  Cardiovascular: Normal rate and regular rhythm.   Pulmonary/Chest: Effort normal.  Abdominal: Soft.  Neurological: He is alert and oriented to person, place, and time. No cranial nerve deficit. Coordination abnormal.  Pt has weakness and decreased coordination to his right lower extremity. Left side of his body has sensation and strength intact.  Pt has a abnormal romberg, unable to stand up straight without swaying and falling over with eyes closed.  Skin: Skin is warm and dry.    ED Course  Procedures (including critical care time) Labs Review Labs Reviewed  CBC - Abnormal; Notable for the following:    Hemoglobin 12.1 (*)    HCT 36.1 (*)    All other components within normal limits  BASIC METABOLIC PANEL - Abnormal; Notable for the following:    Sodium 134 (*)    Glucose, Bld 137 (*)    All other components within normal limits  URINALYSIS, ROUTINE W REFLEX MICROSCOPIC - Abnormal;  Notable for the following:    Protein, ur 100 (*)    All other components within normal limits  URINE MICROSCOPIC-ADD ON - Abnormal; Notable for the following:    Squamous Epithelial / LPF FEW (*)    All other components within normal limits  PROTIME-INR  URINE RAPID DRUG SCREEN (HOSP PERFORMED)  ETHANOL  APTT  I-STAT TROPOININ, ED    Imaging Review Ct Head Wo Contrast  02/28/2014   CLINICAL DATA:  Dizziness, unsteady gait, ectasia  EXAM: CT HEAD WITHOUT CONTRAST  TECHNIQUE: Contiguous axial images were obtained from the base of the skull through the vertex without contrast.  COMPARISON:  None  FINDINGS: Small punctate bilateral basal ganglia lacunar-type infarcts, images 14 and 16.  No acute intracranial hemorrhage, definite acute infarction, mass lesion, midline shift, herniation, hydrocephalus, or extra-axial fluid collection. No mass effect or focal edema. Normal gray-white matter differentiation. Cisterns are patent. No cerebellar abnormality. Atherosclerosis of the intracranial vessels. Mastoids clear. Minor ethmoid mucosal thickening otherwise sinuses clear. Symmetric orbits.  IMPRESSION: Remote appearing basal ganglia small lacunar infarcts. No acute intracranial finding by noncontrast CT.   Electronically Signed   By: Daryll Brod M.D.   On: 02/28/2014 13:11     EKG Interpretation   Date/Time:  Saturday February 28 2014 10:25:25 EDT Ventricular Rate:  73 PR Interval:  162 QRS Duration: 104 QT Interval:  434 QTC Calculation: 478 R Axis:   35 Text Interpretation:  Normal sinus rhythm Incomplete right bundle branch  block Septal infarct , age undetermined Abnormal ECG No significant change  since last tracing Confirmed by Christy Gentles  MD, DONALD ([id number]) on 02/28/2014  11:27:35 AM      MDM   Final diagnoses:  Dizziness   12: 32 pm Dr. Christy Gentles has seen patient as well. He agrees with stroke work-up, he does not meet tpa criteria or criteria for a code stroke.   1: 27 pm CT  head has come back showing only remote lacunar infarcts. No acute bleed. oatients symptoms persist. I spoke with Dr. Rochele Pages who recommended limited MRI to r/o stoke.. Will call hospitalitis for admission.   1:40 pm Dr. Vladimir Crofts has agreed to admit and Dr. Nicole Kindred has agreed to consult. MR/MRA brain without contrast ordered has recommended by neuro and hospitalist.  Inpatient, [place] [facility], team 10, telemetry  Linus Mako, PA-C 02/28/14 1342

## 2014-02-28 NOTE — ED Notes (Signed)
Admitting Physician at the bedside.

## 2014-02-28 NOTE — Consult Note (Signed)
Referring Physician: Christy Gentles, D    Chief Complaint: Vertigo with right facial and hand numbness as well as slurred speech.  HPI: Brad [name] is an 58 y.o. male history of COPD, diabetes mellitus and GERD, presenting with new onset vertigo and facial and hand numbness on the right as well as slurred speech. Onset of symptoms was at 5 AM today. He has no previous history of stroke nor TIA. He has been on Pletal 100 mg per day. CT scan of his head showed no acute intracranial abnormality. Remote appearing basal ganglia lacunar infarcts were noted. NIH stroke score was 3.  LSN: 5 AM on 02/28/2014 tPA Given: No: Beyond time under for treatment consideration MRankin: 1  Past Medical History  Diagnosis Date  . Depression   . GERD (gastroesophageal reflux disease)   . Diabetes mellitus type II   . COPD (chronic obstructive pulmonary disease)   . Ventral hernia   . Chronic LBP     Family History  Problem Relation Age of Onset  . Alcohol abuse Father   . Lung cancer Father   . Heart disease Mother     automated implantable cardioverter-defibrillator   . Diabetes type II Mother   . Lung cancer Maternal Grandmother   . Lung cancer Sister      Medications: I have reviewed the patient's current medications.  ROS: History obtained from the patient  General ROS: negative for - chills, fatigue, fever, night sweats, weight gain or weight loss Psychological ROS: negative for - behavioral disorder, hallucinations, memory difficulties, mood swings or suicidal ideation Ophthalmic ROS: negative for - blurry vision, double vision, eye pain or loss of vision ENT ROS: negative for - epistaxis, nasal discharge, oral lesions, sore throat, tinnitus or vertigo Allergy and Immunology ROS: negative for - hives or itchy/watery eyes Hematological and Lymphatic ROS: negative for - bleeding problems, bruising or swollen lymph nodes Endocrine ROS: negative for - galactorrhea, hair pattern changes,  polydipsia/polyuria or temperature intolerance Respiratory ROS: negative for - cough, hemoptysis, shortness of breath or wheezing Cardiovascular ROS: negative for - chest pain, dyspnea on exertion, edema or irregular heartbeat Gastrointestinal ROS: negative for - abdominal pain, diarrhea, hematemesis, nausea/vomiting or stool incontinence Genito-Urinary ROS: negative for - dysuria, hematuria, incontinence or urinary frequency/urgency Musculoskeletal ROS: negative for - joint swelling or muscular weakness Neurological ROS: as noted in HPI Dermatological ROS: negative for rash and skin lesion changes  Physical Examination: Blood pressure 145/67, pulse 60, temperature 97.4 F (36.3 C), temperature source Oral, resp. rate 14, height 5\' 11"  (1.803 m), weight 117.935 kg (260 lb), SpO2 98.00%.  Neurologic Examination: Mental Status: Alert, oriented, thought content appropriate.  Speech slightly slurred without evidence of aphasia. Able to follow commands without difficulty. Cranial Nerves: II-Visual fields were normal. And III/IV/VI-Pupils were equal and reacted. Extraocular movements were full and conjugate.    V/VII-no facial numbness; mild flattening of right nasolabial fold. VIII-normal. X-normal speech and symmetrical palatal movement. Motor: 5/5 bilaterally with normal tone and bulk; limited range of motion of right shoulder from old injury. Sensory: Mild numbness over right hand compared to the left. Deep Tendon Reflexes: 1+ and symmetric. Plantars: Flexor bilaterally Cerebellar: Normal finger-to-nose testing.   Ct Head Wo Contrast  02/28/2014   CLINICAL DATA:  Dizziness, unsteady gait, ectasia  EXAM: CT HEAD WITHOUT CONTRAST  TECHNIQUE: Contiguous axial images were obtained from the base of the skull through the vertex without contrast.  COMPARISON:  None  FINDINGS: Small punctate bilateral basal ganglia  lacunar-type infarcts, images 14 and 16. No acute intracranial hemorrhage, definite  acute infarction, mass lesion, midline shift, herniation, hydrocephalus, or extra-axial fluid collection. No mass effect or focal edema. Normal gray-white matter differentiation. Cisterns are patent. No cerebellar abnormality. Atherosclerosis of the intracranial vessels. Mastoids clear. Minor ethmoid mucosal thickening otherwise sinuses clear. Symmetric orbits.  IMPRESSION: Remote appearing basal ganglia small lacunar infarcts. No acute intracranial finding by noncontrast CT.   Electronically Signed   By: Daryll Brod M.D.   On: 02/28/2014 13:11    Assessment: 58 y.o. male with multiple risk factors for stroke presenting with possible acute small vessel pontine or subcortical cerebral infarction.  Stroke Risk Factors - diabetes mellitus and smoking  Plan: 1. HgbA1c, fasting lipid panel 2. MRI, MRA  of the brain without contrast 3. PT consult, OT consult, Speech consult 4. Echocardiogram 5. Carotid dopplers 6. Prophylactic therapy-Antiplatelet med: Aspirin -  continue Pletal for now 7. Risk factor modification 8. Telemetry monitoring   C.R. Nicole Kindred, MD [facility] [phone]  02/28/2014, 2:00 PM

## 2014-02-28 NOTE — Telephone Encounter (Signed)
Received a call stating pt had slurred speech, drooping mouth, and arm pain.  Pt had been clumsy all morning and knocking things over.  Pt picked up the phone and began to talk and I could not make out some of the words he was trying to say. Advised that they hang up the phone and call 911 immediately.  Pt and family member verbalized understanding.

## 2014-03-01 DIAGNOSIS — I6322 Cerebral infarction due to unspecified occlusion or stenosis of basilar arteries: Secondary | ICD-10-CM

## 2014-03-01 DIAGNOSIS — I059 Rheumatic mitral valve disease, unspecified: Secondary | ICD-10-CM

## 2014-03-01 LAB — LIPID PANEL
Cholesterol: 119 mg/dL (ref 0–200)
HDL: 29 mg/dL — ABNORMAL LOW (ref >39)
LDL Cholesterol: 63 mg/dL (ref 0–99)
Total CHOL/HDL Ratio: 4.1 RATIO
Triglycerides: 136 mg/dL (ref <150)
VLDL: 27 mg/dL (ref 0–40)

## 2014-03-01 LAB — HEMOGLOBIN A1C
Hgb A1c MFr Bld: 6.3 % — ABNORMAL HIGH (ref <5.7)
Mean Plasma Glucose: 134 mg/dL — ABNORMAL HIGH (ref <117)

## 2014-03-01 LAB — GLUCOSE, CAPILLARY: Glucose-Capillary: 175 mg/dL — ABNORMAL HIGH (ref 70–99)

## 2014-03-01 MED ORDER — ATORVASTATIN CALCIUM 20 MG PO TABS: 20.0000 mg | ORAL_TABLET | Freq: Every day | ORAL | Status: DC

## 2014-03-01 MED ORDER — ASPIRIN 325 MG PO TABS
325.0000 mg | ORAL_TABLET | Freq: Every day | ORAL | Status: DC
  Administered 2014-03-01: 325 mg via ORAL
  Filled 2014-03-01: qty 1

## 2014-03-01 MED ORDER — ASPIRIN 325 MG PO TABS: 325.0000 mg | ORAL_TABLET | Freq: Every day | ORAL | Status: DC

## 2014-03-01 MED ORDER — ATORVASTATIN CALCIUM 10 MG PO TABS: 20.0000 mg | ORAL_TABLET | Freq: Every day | ORAL | Status: DC

## 2014-03-01 MED ORDER — ASPIRIN EC 81 MG PO TBEC: 81.0000 mg | DELAYED_RELEASE_TABLET | Freq: Every day | ORAL | Status: DC

## 2014-03-01 MED ORDER — ASPIRIN 300 MG RE SUPP: 300.0000 mg | Freq: Every day | RECTAL | Status: DC

## 2014-03-01 MED ORDER — NICOTINE 21 MG/24HR TD PT24: 21.0000 mg | MEDICATED_PATCH | Freq: Every day | TRANSDERMAL | Status: DC

## 2014-03-01 NOTE — Evaluation (Signed)
Physical Therapy Evaluation Patient Details Name: Brad [name] MRN: [id number] DOB: [birth year] Today's Date: 03/01/2014   History of Present Illness  Pt admitted with possible CVA.   Clinical Impression  Pt reports all symptoms have resolved.  He is independent with all mobility skills and safe to return home with spouse from therapy standpoint.  PT signing off.    Follow Up Recommendations No PT follow up    Equipment Recommendations  None recommended by PT    Recommendations for Other Services       Precautions / Restrictions Precautions Precautions: None Restrictions Weight Bearing Restrictions: No      Mobility  Bed Mobility Overal bed mobility: Independent                Transfers Overall transfer level: Independent                  Ambulation/Gait Ambulation/Gait assistance: Independent Ambulation Distance (Feet): 350 Feet Assistive device: None Gait Pattern/deviations: WFL(Within Functional Limits)   Gait velocity interpretation: at or above normal speed for age/gender    Stairs            Wheelchair Mobility    Modified Rankin (Stroke Patients Only)       Balance Overall balance assessment: Independent                               Standardized Balance Assessment Standardized Balance Assessment : Berg Balance Test Berg Balance Test Sit to Stand: Able to stand without using hands and stabilize independently Standing Unsupported: Able to stand safely 2 minutes Sitting with Back Unsupported but Feet Supported on Floor or Stool: Able to sit safely and securely 2 minutes Stand to Sit: Sits safely with minimal use of hands Transfers: Able to transfer safely, minor use of hands Standing Unsupported with Eyes Closed: Able to stand 10 seconds safely Standing Ubsupported with Feet Together: Able to place feet together independently and stand 1 minute safely From Standing, Reach Forward with Outstretched Arm: Can  reach confidently >25 cm (10") From Standing Position, Pick up Object from Floor: Able to pick up shoe safely and easily From Standing Position, Turn to Look Behind Over each Shoulder: Looks behind from both sides and weight shifts well Turn 360 Degrees: Able to turn 360 degrees safely in 4 seconds or less Standing Unsupported, Alternately Place Feet on Step/Stool: Able to stand independently and complete 8 steps >20 seconds Standing Unsupported, One Foot in Front: Able to plae foot ahead of the other independently and hold 30 seconds Standing on One Leg: Able to lift leg independently and hold equal to or more than 3 seconds Total Score: 52         Pertinent Vitals/Pain 0/10    Home Living Family/patient expects to be discharged to:: Private residence Living Arrangements: Spouse/significant other Available Help at Discharge: Family Type of Home: House Home Access: Level entry     Home Layout: One level Home Equipment: None      Prior Function Level of Independence: Independent               Hand Dominance        Extremity/Trunk Assessment   Upper Extremity Assessment: RUE deficits/detail RUE Deficits / Details: shoulder sx 2008 followed by infection and muscle atrophy R, R shld mobility limited to below 90 degrees         Lower Extremity Assessment: Overall WFL for tasks  assessed      Cervical / Trunk Assessment: Normal  Communication   Communication: No difficulties  Cognition Arousal/Alertness: Awake/alert Behavior During Therapy: WFL for tasks assessed/performed Overall Cognitive Status: Within Functional Limits for tasks assessed                      General Comments      Exercises        Assessment/Plan    PT Assessment Patent does not need any further PT services  PT Diagnosis     PT Problem List    PT Treatment Interventions     PT Goals (Current goals can be found in the Care Plan section) Acute Rehab PT Goals Patient  Stated Goal: home today PT Goal Formulation: No goals set, d/c therapy    Frequency     Barriers to discharge        Co-evaluation               End of Session   Activity Tolerance: Patient tolerated treatment well Patient left: in bed;with call bell/phone within reach;with family/visitor present Nurse Communication: Mobility status         Time: 1505-6979 PT Time Calculation (min): 14 min   Charges:     PT Treatments $Gait Training: 8-22 mins   PT G Codes:          Lorriane Shire 03/01/2014, 8:30 AM  Lorrin Goodell, PT  Office # [phone] Pager [phone]

## 2014-03-01 NOTE — Evaluation (Signed)
Occupational Therapy Evaluation Patient Details Name: Brad [name] MRN: [id number] DOB: Aug 18, 1955 Today's Date: 03/01/2014    History of Present Illness Pt admitted for possible CVA. MRI revealed remote lacunar infarcts in the right thalamus and left puatmen. No sizable acute/subacute infarct. Question 2 mm acute left pontine infarct versus artifact.   Clinical Impression   Pt admitted with above. Pt moving well during session. OT provided stroke education. No further OT needs.     Follow Up Recommendations  No OT follow up    Equipment Recommendations  None recommended by OT    Recommendations for Other Services       Precautions / Restrictions Precautions Precautions: None Restrictions Weight Bearing Restrictions: No      Mobility        Transfers Overall transfer level: Independent                         ADL Overall ADL's : Modified independent                                       General ADL Comments: Educated on signs/symptoms of stroke and importance of getting help right away and also advised to stop smoking. Recommended pt avoid canned foods due to increased sodium but recommended fresh/frozen veggies instead.     Vision    Visual fields: No apparent deficits   Tracking/Visual Pursuits: Able to track stimulus in all quads without difficulty (cues to keep head still)             Perception     Praxis      Pertinent Vitals/Pain No pain reported.      Hand Dominance Right   Extremity/Trunk Assessment Upper Extremity Assessment Upper Extremity Assessment: RUE deficits/detail RUE Deficits / Details: shoulder sx 2008 followed by infection and muscle atrophy R, R shld mobility limited to below 90 degrees   Lower Extremity Assessment Lower Extremity Assessment: Overall WFL for tasks assessed   Cervical / Trunk Assessment Cervical / Trunk Assessment: Normal   Communication Communication Communication: No  difficulties   Cognition Arousal/Alertness: Awake/alert Behavior During Therapy: WFL for tasks assessed/performed Overall Cognitive Status: Within Functional Limits for tasks assessed                     General Comments       Exercises       Shoulder Instructions      Home Living Family/patient expects to be discharged to:: Private residence Living Arrangements: Spouse/significant other Available Help at Discharge: Family Type of Home: House Home Access: Level entry     Home Layout: One level     Bathroom Shower/Tub: Aeronautical engineer: None          Prior Functioning/Environment Level of Independence: Independent             OT Diagnosis:     OT Problem List:     OT Treatment/Interventions:         OT Frequency:     Barriers to D/C:            Co-evaluation              End of Session    Activity Tolerance: Patient tolerated treatment well Patient left: in bed;with family/visitor present;with nursing/sitter in room  Time: 5916-3846 OT Time Calculation (min): 18 min Charges:  OT General Charges $OT Visit: 1 Procedure OT Evaluation $Initial OT Evaluation Tier I: 1 Procedure G-CodesBenito Mccreedy  OTR/L [phone]  03/01/2014, 10:09 AM

## 2014-03-01 NOTE — Progress Notes (Signed)
Stroke Team Progress Note  HISTORY Brad [name] is a 58 y.o. male with a history of COPD, diabetes mellitus and GERD, presenting 02/28/2014 with new onset vertigo and facial and hand numbness on the right as well as slurred speech. Onset of symptoms was at 5 AM 02/28/2014. He has no previous history of stroke nor TIA. He has been on Pletal 100 mg per day for peripheral vascular disease. CT scan of his head showed no acute intracranial abnormality. Remote appearing basal ganglia lacunar infarcts were noted. NIH stroke score was 3.   LSN: 5 AM on 02/28/2014  tPA Given: No: Beyond time under for treatment consideration  MRankin: 1  SUBJECTIVE The patient's wife was present. Dr Erlinda Hong discussed the patient's presentation as well as his multiple risk factors. Smoking cessation was strongly recommended. The patient is very anxious for discharge. The patient admits to not being compliant with his aspirin therapy.  OBJECTIVE Most recent Vital Signs: Filed Vitals:   03/01/14 0214 03/01/14 0559 03/01/14 0932 03/01/14 1325  BP: 153/68 133/76 165/75 150/63  Pulse: 90 77 56 60  Temp: 98 F (36.7 C) 98.4 F (36.9 C) 97.8 F (36.6 C) 97.3 F (36.3 C)  TempSrc: Oral Oral Oral Oral  Resp: 18 18 18 18   Height:      Weight:      SpO2: 96% 95% 99% 100%   CBG (last 3)   Recent Labs  02/28/14 1731 02/28/14 2212 03/01/14 0627  GLUCAP 125* 130* 175*    IV Fluid Intake:   . sodium chloride 10 mL/hr at 02/28/14 1729    MEDICATIONS  .  stroke: mapping our early stages of recovery book   Does not apply Once  . aspirin  300 mg Rectal Daily   Or  . aspirin  325 mg Oral Daily  . atorvastatin  20 mg Oral q1800  . cilostazol  100 mg Oral Daily  . enoxaparin (LOVENOX) injection  40 mg Subcutaneous Q24H  . insulin aspart  0-9 Units Subcutaneous TID WC  . levothyroxine  50 mcg Oral QAC breakfast  . mupirocin ointment  1 application Topical Daily  . nicotine  21 mg Transdermal Daily   PRN:   acetaminophen, acetaminophen, albuterol, senna-docusate, tiZANidine, traMADol  Diet:     heart healthy carb modified diet with thin liquids Activity:  Up with assistance DVT Prophylaxis:  Lovenox  CLINICALLY SIGNIFICANT STUDIES Basic Metabolic Panel:  Recent Labs Lab 02/28/14 1035  NA 134*  K 4.4  CL 96  CO2 26  GLUCOSE 137*  BUN 8  CREATININE 0.77  CALCIUM 8.6   Liver Function Tests: No results found for this basename: AST, ALT, ALKPHOS, BILITOT, PROT, ALBUMIN,  in the last 168 hours CBC:  Recent Labs Lab 02/28/14 1035  WBC 5.2  HGB 12.1*  HCT 36.1*  MCV 78.1  PLT 180   Coagulation:  Recent Labs Lab 02/28/14 1035  LABPROT 13.5  INR 1.03   Cardiac Enzymes: No results found for this basename: CKTOTAL, CKMB, CKMBINDEX, TROPONINI,  in the last 168 hours Urinalysis:  Recent Labs Lab 02/28/14 1138  COLORURINE YELLOW  LABSPEC 1.010  PHURINE 5.0  GLUCOSEU NEGATIVE  HGBUR NEGATIVE  BILIRUBINUR NEGATIVE  KETONESUR NEGATIVE  PROTEINUR 100*  UROBILINOGEN 1.0  NITRITE NEGATIVE  LEUKOCYTESUR NEGATIVE   Lipid Panel    Component Value Date/Time   CHOL 119 03/01/2014 0600   TRIG 136 03/01/2014 0600   HDL 29* 03/01/2014 0600   CHOLHDL 4.1 03/01/2014 0600  VLDL 27 03/01/2014 0600   LDLCALC 63 03/01/2014 0600   HgbA1C  Lab Results  Component Value Date   HGBA1C 6.3* 02/28/2014    Urine Drug Screen:     Component Value Date/Time   LABOPIA NONE DETECTED 02/28/2014 1138   COCAINSCRNUR NONE DETECTED 02/28/2014 1138   LABBENZ NONE DETECTED 02/28/2014 1138   AMPHETMU NONE DETECTED 02/28/2014 1138   THCU NONE DETECTED 02/28/2014 1138   LABBARB NONE DETECTED 02/28/2014 1138    Alcohol Level:  Recent Labs Lab 02/28/14 1038  ETH <11    Dg Chest 2 View 02/28/2014    No edema or consolidation.    Ct Head Wo Contrast 02/28/2014    Remote appearing basal ganglia small lacunar infarcts. No acute intracranial finding by noncontrast CT.      Mr Angiogram Head Wo  Contrast 02/28/2014    1. No sizable acute/subacute infarct. Question 2 mm acute left pontine infarct versus artifact.  2. Remote lacunar infarcts in the right thalamus and left puatmen.   3. Moderately motion degraded MRA. Small and irregular distal right vertebral artery with either severe stenosis or occlusion immediately proximal to the vertebrobasilar junction. Left vertebral artery is dominant and supplies the basilar.  4. Bilateral P2 stenoses, mild on the right and moderate on the left.  5. No evidence of significant ACA or MCA stenosis.     Carotid Doppler  Preliminary report: Right: 1-39% ICA stenosis, highest end of range. Left: 60-79% ICA stenosis, highest end of range. Bilateral vertebral artery flow is antegrade.   2D Echocardiogram  pending  EKG - Normal sinus rhythm rate 73 beats per minute. For complete results please see formal report.   Therapy Recommendations no followup physical or occupational therapy recommended.  Physical Exam   Neurologic Examination:  Mental Status:  Alert, oriented, thought content appropriate. Speech slightly slurred without evidence of aphasia. Able to follow commands without difficulty.  Cranial Nerves:  II-Visual fields were normal.  III/IV/VI-Pupils were equal and reacted. Extraocular movements were full and conjugate.  V/VII-no facial numbness; mild flattening of right nasolabial fold.  VIII-normal.  X-normal speech and symmetrical palatal movement.  Motor: 5/5 bilaterally with normal tone and bulk; limited range of motion of right shoulder from old injury.  Sensory: Mild numbness over right hand compared to the left.  Deep Tendon Reflexes: 1+ and symmetric.  Plantars: Flexor bilaterally  Cerebellar: Normal finger-to-nose testing.  ASSESSMENT [name] is a 58 y.o. male presenting with vertigo, right facial and hand numbness, and slurred speech. TPA therapy was not initiated secondary to late presentation and minimal  deficits. MRI - No sizable acute/subacute infarct. Question 2 mm acute left pontine infarct versus artifact. Strokes felt secondary to small vessel disease On Goody's headache powders and Pletal prior to admission. Now on aspirin 325 mg orally every day for secondary stroke prevention. Patient with resultant resolution of deficits. Stroke work up underway.   Tobacco use - the patient counseled to abstain from tobacco  Cholesterol 119 LDL 63 - no statin prior to admission - now on Lipitor 20 mg daily.  Diabetes mellitus - hemoglobin A1c 6.3, improved from prior  Peripheral vascular disease - Pletal   Previous infarcts  Left internal carotid artery stenosis by carotid Dopplers - 60-79%.  Right vertebral artery stenosis/occlusion. Left vertebral artery patent and dominant.   Hospital day # 1  TREATMENT/PLAN  Change aspirin 325 mg to  aspirin 81 mg orally every day for secondary stroke prevention since  pt is also on pletal.  Continue pletal for PVD.    Tobacco cessation - patient counseled   Optimize diabetic control  Encouraged compliance with medications  Await 2-D echo  left internal carotid artery stenosis found on CUS. However, pt no recent left side stroke. The left BG stroke was several yes ago. So, no clear benefit at this moment for any procedue.   No followup therapy is recommended  Followup Dr Erlinda Hong in 2 months  Continue Lipitor 20 mg daily.  Mikey Bussing PA-C [facility] Pager [phone] 03/01/2014, 2:08 PM   SIGNED I, the attending vascular neurologist, have personally obtained a history, examined the patient, evaluated laboratory data, individually viewed imaging studies, and formulated the assessment and plan of care.  I have made any additions or clarifications directly to the above note and agree with the findings and plan as currently documented.   Rosalin Hawking, MD PhD 03/01/2014 10:24 PM   To contact Stroke Continuity provider, please  refer to [email or web address] After hours, contact General Neurology

## 2014-03-01 NOTE — Progress Notes (Signed)
Patient discharged home, iv out, stroke education completed, wife at bedside, patient understands need for life changes,  Patient ambulated out escorted by nurse, follow up appointments made, no written prescriptions

## 2014-03-01 NOTE — Progress Notes (Signed)
VASCULAR LAB PRELIMINARY  PRELIMINARY  PRELIMINARY  PRELIMINARY  Carotid Dopplers completed.    Preliminary report:  Right: 1-39% ICA stenosis, highest end of range.  Left: 60-79% ICA stenosis, highest end of range.  Bilateral vertebral artery flow is antegrade.   Valrie Jia, RVT 03/01/2014, 11:27 AM

## 2014-03-01 NOTE — Discharge Summary (Signed)
Physician Discharge Summary  Brad [name] MRN: [id number] DOB/AGE: [birth year] 56 y.o.  PCP: [name], MD   Admit date: 02/28/2014 Discharge date: 03/01/2014  Discharge Diagnoses:    Probable acute small vessel pontine or subcortical cerebral infarction. Active Problems:   Type II or unspecified type diabetes mellitus without mention of complication, uncontrolled   COPD   GERD   Chronic LBP   Headache(784.0)   Obesity (BMI 30-39.9)  Follow up recommendations Risk Factor modification to be continued aggressively in the outpatient setting by PCP Follow up on results of 2 d echo F/U with [name] for left ICA stenosis     Medication List    STOP taking these medications       GOODY HEADACHE PO      TAKE these medications       albuterol 108 (90 BASE) MCG/ACT inhaler  Commonly known as:  PROVENTIL HFA;VENTOLIN HFA  Inhale 2 puffs into the lungs every 6 (six) hours as needed for wheezing or shortness of breath.     aspirin 81 MG tablet  Take 1 tablet (81 mg total) by mouth daily.     atorvastatin 20 MG tablet  Commonly known as:  LIPITOR  Take 1 tablet (20 mg total) by mouth daily at 6 PM.     cilostazol 100 MG tablet  Commonly known as:  PLETAL  Take 100 mg by mouth daily.     glipiZIDE 2.5 MG 24 hr tablet  Commonly known as:  GLIPIZIDE XL  Take 1 tablet (2.5 mg total) by mouth daily with breakfast.     glucose blood test strip  1 each by Other route See admin instructions. Check blood sugar once daily.     Lancets Misc  1 each by Other route See admin instructions. Check blood sugar once daily.     levothyroxine 50 MCG tablet  Commonly known as:  SYNTHROID, LEVOTHROID  Take 50 mcg by mouth daily before breakfast.     metFORMIN 500 MG tablet  Commonly known as:  GLUCOPHAGE  Take 1,000 mg by mouth daily.     mupirocin ointment 2 %  Commonly known as:  BACTROBAN  Apply 1 application topically daily.     nicotine 21 mg/24hr patch   Commonly known as:  NICODERM CQ - dosed in mg/24 hours  Place 1 patch (21 mg total) onto the skin daily.     nortriptyline 10 MG capsule  Commonly known as:  PAMELOR  Take 1 capsule (10 mg total) by mouth at bedtime.     tiotropium 18 MCG inhalation capsule  Commonly known as:  SPIRIVA  Place 18 mcg into inhaler and inhale 2 (two) times daily as needed (for wheezing).     tiZANidine 4 MG tablet  Commonly known as:  ZANAFLEX  Take 8 mg by mouth every 6 (six) hours as needed for muscle spasms.     traMADol 50 MG tablet  Commonly known as:  ULTRAM  Take 50 mg by mouth every 8 (eight) hours as needed for moderate pain.        Discharge Condition: Stable   Disposition: 01-Home or Self Care   Consults: *Neurology   Significant Diagnostic Studies: Dg Chest 2 View  02/28/2014   CLINICAL DATA:  Acute CVA  EXAM: CHEST  2 VIEW  COMPARISON:  October 10, 2013  FINDINGS: There is no edema or consolidation. The heart size and pulmonary vascularity are normal. No adenopathy. There is degenerative change  in the thoracic spine.  IMPRESSION: No edema or consolidation.   Electronically Signed   By: Lowella Grip M.D.   On: 02/28/2014 18:43   Ct Head Wo Contrast  02/28/2014   CLINICAL DATA:  Dizziness, unsteady gait, ectasia  EXAM: CT HEAD WITHOUT CONTRAST  TECHNIQUE: Contiguous axial images were obtained from the base of the skull through the vertex without contrast.  COMPARISON:  None  FINDINGS: Small punctate bilateral basal ganglia lacunar-type infarcts, images 14 and 16. No acute intracranial hemorrhage, definite acute infarction, mass lesion, midline shift, herniation, hydrocephalus, or extra-axial fluid collection. No mass effect or focal edema. Normal gray-white matter differentiation. Cisterns are patent. No cerebellar abnormality. Atherosclerosis of the intracranial vessels. Mastoids clear. Minor ethmoid mucosal thickening otherwise sinuses clear. Symmetric orbits.  IMPRESSION: Remote  appearing basal ganglia small lacunar infarcts. No acute intracranial finding by noncontrast CT.   Electronically Signed   By: Daryll Brod M.D.   On: 02/28/2014 13:11   Mr Angiogram Head Wo Contrast  02/28/2014   CLINICAL DATA:  Vertigo, right facial and hand numbness, and slurred speech.  EXAM: MRI HEAD WITHOUT CONTRAST  MRA HEAD WITHOUT CONTRAST  TECHNIQUE: Multiplanar, multiecho pulse sequences of the brain and surrounding structures were obtained without intravenous contrast. Angiographic images of the head were obtained using MRA technique without contrast.  COMPARISON:  Head CT 02/28/2014  FINDINGS: MRI HEAD FINDINGS  No large territory acute or subacute infarct is seen. A punctate focus of restricted diffusion is questioned in the posterior pons just left of midline (series 5, image 10). There is no evidence of intracranial hemorrhage, mass, midline shift, or extra-axial fluid collection. Several small foci of T2 hyperintensity in the cerebral white matter are nonspecific but compatible with minimal chronic small vessel ischemic disease. Ventricles and sulci are within normal limits for age. Old lacunar infarcts are noted in the left putamen and right thalamus.  Orbits are unremarkable. Moderate bilateral ethmoid air cell mucosal thickening and a small left maxillary sinus mucous retention cyst are noted. Mastoid air cells are clear. Distal right vertebral artery flow void is abnormal. Other major intracranial vascular flow voids appear preserved.  MRA HEAD FINDINGS  Images are moderately degraded by motion artifact. The intracranial right vertebral artery appears small in caliber and mildly to moderately irregular and is either severely stenotic or occluded immediately proximal to the vertebrobasilar junction. The visualized distal left vertebral artery is patent and supplies the basilar. Left PICA origin is patent. Right PICA origin is not well evaluated. SCA origins are patent. Basilar is patent  without evidence of stenosis. PCA origins are patent. There is a mild focal stenosis of the proximal right P2 segment, and there is a moderate focal stenosis of the left mid P2 segment. A small right posterior communicating artery is seen.  Internal carotid arteries are patent from skullbase to carotid termini. Carotid siphons are not well evaluated due to motion. ACA and MCA origins are patent. ACAs and MCAs are grossly unremarkable aside from mild hypoplasia of the left A1 segment. No gross intracranial aneurysm is seen.  IMPRESSION: 1. No sizable acute/subacute infarct. Question 2 mm acute left pontine infarct versus artifact. 2. Remote lacunar infarcts in the right thalamus and left puatmen. 3. Moderately motion degraded MRA. Small and irregular distal right vertebral artery with either severe stenosis or occlusion immediately proximal to the vertebrobasilar junction. Left vertebral artery is dominant and supplies the basilar. 4. Bilateral P2 stenoses, mild on the right and moderate on  the left. 5. No evidence of significant ACA or MCA stenosis.   Electronically Signed   By: Logan Bores   On: 02/28/2014 17:32   Mr Brain Wo Contrast  02/28/2014   CLINICAL DATA:  Vertigo, right facial and hand numbness, and slurred speech.  EXAM: MRI HEAD WITHOUT CONTRAST  MRA HEAD WITHOUT CONTRAST  TECHNIQUE: Multiplanar, multiecho pulse sequences of the brain and surrounding structures were obtained without intravenous contrast. Angiographic images of the head were obtained using MRA technique without contrast.  COMPARISON:  Head CT 02/28/2014  FINDINGS: MRI HEAD FINDINGS  No large territory acute or subacute infarct is seen. A punctate focus of restricted diffusion is questioned in the posterior pons just left of midline (series 5, image 10). There is no evidence of intracranial hemorrhage, mass, midline shift, or extra-axial fluid collection. Several small foci of T2 hyperintensity in the cerebral white matter are  nonspecific but compatible with minimal chronic small vessel ischemic disease. Ventricles and sulci are within normal limits for age. Old lacunar infarcts are noted in the left putamen and right thalamus.  Orbits are unremarkable. Moderate bilateral ethmoid air cell mucosal thickening and a small left maxillary sinus mucous retention cyst are noted. Mastoid air cells are clear. Distal right vertebral artery flow void is abnormal. Other major intracranial vascular flow voids appear preserved.  MRA HEAD FINDINGS  Images are moderately degraded by motion artifact. The intracranial right vertebral artery appears small in caliber and mildly to moderately irregular and is either severely stenotic or occluded immediately proximal to the vertebrobasilar junction. The visualized distal left vertebral artery is patent and supplies the basilar. Left PICA origin is patent. Right PICA origin is not well evaluated. SCA origins are patent. Basilar is patent without evidence of stenosis. PCA origins are patent. There is a mild focal stenosis of the proximal right P2 segment, and there is a moderate focal stenosis of the left mid P2 segment. A small right posterior communicating artery is seen.  Internal carotid arteries are patent from skullbase to carotid termini. Carotid siphons are not well evaluated due to motion. ACA and MCA origins are patent. ACAs and MCAs are grossly unremarkable aside from mild hypoplasia of the left A1 segment. No gross intracranial aneurysm is seen.  IMPRESSION: 1. No sizable acute/subacute infarct. Question 2 mm acute left pontine infarct versus artifact. 2. Remote lacunar infarcts in the right thalamus and left puatmen. 3. Moderately motion degraded MRA. Small and irregular distal right vertebral artery with either severe stenosis or occlusion immediately proximal to the vertebrobasilar junction. Left vertebral artery is dominant and supplies the basilar. 4. Bilateral P2 stenoses, mild on the right and  moderate on the left. 5. No evidence of significant ACA or MCA stenosis.   Electronically Signed   By: Logan Bores   On: 02/28/2014 17:32       Microbiology: No results found for this or any previous visit (from the past 240 hour(s)).   Labs: Results for orders placed during the hospital encounter of 02/28/14 (from the past 48 hour(s))  CBC     Status: Abnormal   Collection Time    02/28/14 10:35 AM      Result Value Ref Range   WBC 5.2  4.0 - 10.5 K/uL   RBC 4.62  4.22 - 5.81 MIL/uL   Hemoglobin 12.1 (*) 13.0 - 17.0 g/dL   HCT 36.1 (*) 39.0 - 52.0 %   MCV 78.1  78.0 - 100.0 fL   MCH  26.2  26.0 - 34.0 pg   MCHC 33.5  30.0 - 36.0 g/dL   RDW 15.1  11.5 - 15.5 %   Platelets 180  150 - 400 K/uL  BASIC METABOLIC PANEL     Status: Abnormal   Collection Time    02/28/14 10:35 AM      Result Value Ref Range   Sodium 134 (*) 137 - 147 mEq/L   Potassium 4.4  3.7 - 5.3 mEq/L   Chloride 96  96 - 112 mEq/L   CO2 26  19 - 32 mEq/L   Glucose, Bld 137 (*) 70 - 99 mg/dL   BUN 8  6 - 23 mg/dL   Creatinine, Ser 0.77  0.50 - 1.35 mg/dL   Calcium 8.6  8.4 - 10.5 mg/dL   GFR calc non Af Amer >90  >90 mL/min   GFR calc Af Amer >90  >90 mL/min   Comment: (NOTE)     The eGFR has been calculated using the CKD EPI equation.     This calculation has not been validated in all clinical situations.     eGFR's persistently <90 mL/min signify possible Chronic Kidney     Disease.   Anion gap 12  5 - 15  PROTIME-INR     Status: None   Collection Time    02/28/14 10:35 AM      Result Value Ref Range   Prothrombin Time 13.5  11.6 - 15.2 seconds   INR 1.03  0.00 - 1.49  HEMOGLOBIN A1C     Status: Abnormal   Collection Time    02/28/14 10:35 AM      Result Value Ref Range   Hemoglobin A1C 6.3 (*) <5.7 %   Comment: (NOTE)                                                                               According to the ADA Clinical Practice Recommendations for 2011, when     HbA1c is used as a  screening test:      >=6.5%   Diagnostic of Diabetes Mellitus               (if abnormal result is confirmed)     5.7-6.4%   Increased risk of developing Diabetes Mellitus     References:Diagnosis and Classification of Diabetes Mellitus,Diabetes     RSWN,4627,03(JKKXF 1):S62-S69 and Standards of Medical Care in             Diabetes - 2011,Diabetes Care,2011,34 (Suppl 1):S11-S61.   Mean Plasma Glucose 134 (*) <117 mg/dL   Comment: Performed at [facility]     Status: None   Collection Time    02/28/14 10:38 AM      Result Value Ref Range   Alcohol, Ethyl (B) <11  0 - 11 mg/dL   Comment:            LOWEST DETECTABLE LIMIT FOR     SERUM ALCOHOL IS 11 mg/dL     FOR MEDICAL PURPOSES ONLY  APTT     Status: None   Collection Time    02/28/14 10:38 AM      Result Value Ref  Range   aPTT 33  24 - 37 seconds  I-STAT TROPOININ, ED     Status: None   Collection Time    02/28/14 10:45 AM      Result Value Ref Range   Troponin i, poc 0.00  0.00 - 0.08 ng/mL   Comment 3            Comment: Due to the release kinetics of cTnI,     a negative result within the first hours     of the onset of symptoms does not rule out     myocardial infarction with certainty.     If myocardial infarction is still suspected,     repeat the test at appropriate intervals.  URINE RAPID DRUG SCREEN (HOSP PERFORMED)     Status: None   Collection Time    02/28/14 11:38 AM      Result Value Ref Range   Opiates NONE DETECTED  NONE DETECTED   Cocaine NONE DETECTED  NONE DETECTED   Benzodiazepines NONE DETECTED  NONE DETECTED   Amphetamines NONE DETECTED  NONE DETECTED   Tetrahydrocannabinol NONE DETECTED  NONE DETECTED   Barbiturates NONE DETECTED  NONE DETECTED   Comment:            DRUG SCREEN FOR MEDICAL PURPOSES     ONLY.  IF CONFIRMATION IS NEEDED     FOR ANY PURPOSE, NOTIFY LAB     WITHIN 5 DAYS.                LOWEST DETECTABLE LIMITS     FOR URINE DRUG SCREEN     Drug Class        Cutoff (ng/mL)     Amphetamine      1000     Barbiturate      200     Benzodiazepine   829     Tricyclics       562     Opiates          300     Cocaine          300     THC              50  URINALYSIS, ROUTINE W REFLEX MICROSCOPIC     Status: Abnormal   Collection Time    02/28/14 11:38 AM      Result Value Ref Range   Color, Urine YELLOW  YELLOW   APPearance CLEAR  CLEAR   Specific Gravity, Urine 1.010  1.005 - 1.030   pH 5.0  5.0 - 8.0   Glucose, UA NEGATIVE  NEGATIVE mg/dL   Hgb urine dipstick NEGATIVE  NEGATIVE   Bilirubin Urine NEGATIVE  NEGATIVE   Ketones, ur NEGATIVE  NEGATIVE mg/dL   Protein, ur 100 (*) NEGATIVE mg/dL   Urobilinogen, UA 1.0  0.0 - 1.0 mg/dL   Nitrite NEGATIVE  NEGATIVE   Leukocytes, UA NEGATIVE  NEGATIVE  URINE MICROSCOPIC-ADD ON     Status: Abnormal   Collection Time    02/28/14 11:38 AM      Result Value Ref Range   Squamous Epithelial / LPF FEW (*) RARE  GLUCOSE, CAPILLARY     Status: Abnormal   Collection Time    02/28/14  5:31 PM      Result Value Ref Range   Glucose-Capillary 125 (*) 70 - 99 mg/dL  GLUCOSE, CAPILLARY     Status: Abnormal   Collection Time    02/28/14 10:12  PM      Result Value Ref Range   Glucose-Capillary 130 (*) 70 - 99 mg/dL   Comment 1 Documented in Chart     Comment 2 Notify RN    LIPID PANEL     Status: Abnormal   Collection Time    03/01/14  6:00 AM      Result Value Ref Range   Cholesterol 119  0 - 200 mg/dL   Triglycerides 136  <150 mg/dL   HDL 29 (*) >39 mg/dL   Total CHOL/HDL Ratio 4.1     VLDL 27  0 - 40 mg/dL   LDL Cholesterol 63  0 - 99 mg/dL   Comment:            Total Cholesterol/HDL:CHD Risk     Coronary Heart Disease Risk Table                         Men   Women      1/2 Average Risk   3.4   3.3      Average Risk       5.0   4.4      2 X Average Risk   9.6   7.1      3 X Average Risk  23.4   11.0                Use the calculated Patient Ratio     above and the CHD Risk Table     to  determine the patient's CHD Risk.                ATP III CLASSIFICATION (LDL):      <100     mg/dL   Optimal      100-129  mg/dL   Near or Above                        Optimal      130-159  mg/dL   Borderline      160-189  mg/dL   High      >190     mg/dL   Very High  GLUCOSE, CAPILLARY     Status: Abnormal   Collection Time    03/01/14  6:27 AM      Result Value Ref Range   Glucose-Capillary 175 (*) 70 - 99 mg/dL     HPI : 58 y.o. [name] with history of type II DM, COPD, GERD, chronic low back pain, chronic intermittent headaches, PAD of right lower extremity, ongoing tobacco abuse, status post right shoulder surgery, presented to the ED with above complaints. He states that he went to bed at 9 PM last night without complaints. He woke up at 5 AM on 02/28/14 with complaints of dizziness, clumsiness, knocked down 2 glasses of water in an attempt to drink, unsteady gait, facial droop, slurred speech and right-sided weakness. No history of loss of consciousness or headache. Some of the symptoms have gradually improved-including slurred speech. In the ED, hemoglobin 12.1, glucose 137 and CT head shows no acute intracranial findings but shows remote appearing basal ganglia lacunar infarcts. Neurology was consulted by ED and recommend limited MRI brain and MRA brain. Patient was not on antithrombotic is prior to admission. Hospitalist admission requested for possible CVA.  HOSPITAL COURSE:  1. Possible acute small vessel pontine subcortical cerebral infarction with right hemiparesis: He was admitted to telemetry. Complete stroke workup including MRI  and MRA brain showed no sizeable acute infarct but there is a question of a 2 mm acute left pontine infarct versus artifact on the MRI, carotid Dopplers shows 60-Brad% ICA stenosis, 2-D echo pending, fasting lipids show triglycerides of 136, LDL 63. The patient has been initiated on a statin. hemoglobin A1c 6.3. Not candidate for TPA due to unknown time of  onset. Not on antithrombotic is prior to admission. Started on aspirin 325 mg daily for secondary stroke prophylaxis, which he will continue. Patient passed bedside swallow screen. Tobacco cessation counseled. PT, OT and ST evaluation indicated no followup needed. Neurology recommends to continue ASA at 81 mg/day, Follow uo with Dr Erlinda Hong in 2 months  2. Tobacco abuse: Cessation counseled. Nicotine patch. 3. COPD: Stable. Tobacco cessation counseled. 4. Hypothyroid: Continue Synthroid. 5. Type II DM: Continue oral hypoglycemics, A1c 6.3. 6. PAD right lower extremity: Continue Pletal. See [name] in 1-2 months  7. Chronic intermittent headache and chronic back pain: Continue home pain medication regimen. 8.    Discharge Exam:  Blood pressure 165/75, pulse 56, temperature 97.8 F (36.6 C), temperature source Oral, resp. rate 18, height _0  (1.803 m), weight 117.935 kg (260 lb), SpO2 99.00%.  General exam: Moderately built and nourished middle-aged [name] patient, lying comfortably propped up on the gurney in no obvious distress.  Head, eyes and ENT: Nontraumatic and normocephalic. Pupils equally reacting to light and accommodation. Oral mucosa moist.  Neck: Supple. No JVD, carotid bruit or thyromegaly.  Lymphatics: No lymphadenopathy.  Respiratory system: Clear to auscultation. No increased work of breathing.  Cardiovascular system: S1 and S2 heard, RRR. No JVD, murmurs, gallops, clicks or pedal edema.  Gastrointestinal system: Abdomen is nondistended, soft and nontender. Normal bowel sounds heard. No organomegaly or masses appreciated.  Central nervous system: Alert and oriented x3. Facial asymmetry +: Diminished right and prominent left nasolabial fold, expressive aphasia, mild dysarthria.           Follow-up Information   Follow up with [name], MD. Schedule an appointment as soon as possible for a visit in 1 week.   Specialties:  Internal Medicine, Radiology   Contact  information:   [address] [place] [address] [phone]       Signed: Reyne Dumas 03/01/2014, 10:27 AM

## 2014-03-01 NOTE — Progress Notes (Signed)
  Echocardiogram 2D Echocardiogram has been performed.  Darlina Sicilian M 03/01/2014, 11:17 AM

## 2014-03-02 ENCOUNTER — Ambulatory Visit: Payer: Medicaid Other | Admitting: Surgery

## 2014-03-02 ENCOUNTER — Telehealth: Payer: Self-pay | Admitting: Internal Medicine

## 2014-03-02 LAB — GLUCOSE, CAPILLARY: Glucose-Capillary: 100 mg/dL — ABNORMAL HIGH (ref 70–99)

## 2014-03-02 NOTE — Progress Notes (Signed)
UR complete.  Dreamer Carillo RN, MSN 

## 2014-03-02 NOTE — Telephone Encounter (Signed)
Patient Information:  Caller Name: Jacqulynn Cadet  Phone: [phone]  Patient: Brad [name], Brad [name]  Gender: Male  DOB: 10-12-1955  Age: 58 Years  PCP: [name] (Adults only)  Office Follow Up:  Does the office need to follow up with this patient?: Yes  Instructions For The Office: Please f/u with pt if appt needed before 03/11/14.  Thank you.   Symptoms  Reason For Call & Symptoms: Pt reports he has stroke over the weekend.  Pt states Moses Cones will be seening over the medical records, pt also states he has appt scheduled for 03/11/14.  Pt would like to know if he needs to be seen before then and if so please call him.  Reviewed Health History In EMR: Yes  Reviewed Medications In EMR: Yes  Reviewed Allergies In EMR: Yes  Reviewed Surgeries / Procedures: Yes  Date of Onset of Symptoms: 02/28/2014  Guideline(s) Used:  No Protocol Available - Sick Adult  Disposition Per Guideline:   Discuss with PCP and Callback by Nurse Today  Reason For Disposition Reached:   Nursing judgment  Advice Given:  Call Back If:  New symptoms develop  You become worse.  Patient Will Follow Care Advice:  YES

## 2014-03-02 NOTE — Telephone Encounter (Signed)
Called pt inform him per discharge to make hosp f/u in 1 week. Pt already has appt schedule for next Wed he states he feel fine. His wife wanted him to call he was reaching up in the cabinet lean his head backwards and he became a little dizzy. Advise pt to take it easy, but if he start have sxs of dizziness non-stop to go back to Er for evaluation...Johny Chess

## 2014-03-03 ENCOUNTER — Telehealth: Payer: Self-pay | Admitting: *Deleted

## 2014-03-03 NOTE — Telephone Encounter (Signed)
Please let pt know that he had brainstem stroke last week for hospitalization and that could cause dizziness feeling. However, he should check his BP at home because his BP was high during the hospitalization which could also cause him to feel foggy, lightheadedness. He supposed to be on ASA 81mg  together with pletal 100mg  so please let him change to baby ASA daily. If he needs prescription for baby aspirin, we can do that. For headache, please take tylenol, avoid NSAIDs. If his condition getting worse with N/V, vertigo, double vision, speech difficulty, weakness or numbness, please let him either give [place] a call or call 911. Thank you.  Rosalin Hawking, MD PhD 03/03/2014 5:26 PM

## 2014-03-03 NOTE — Telephone Encounter (Signed)
Brad [name] called. Pt having sx of foggyheadedness and drunk like feeling, dizziness on Monday.  Today dizziness also c/o headache.   He stated is not having any sx of numbness tingling, weakness.  Has had these sx previously but went away prior to leaving the hospital for discharge, on Sunday.  He is taking 325mg  po daily aspirin and also pletal 100mg  po daily.  Has appt in 2 months.

## 2014-03-04 NOTE — Telephone Encounter (Signed)
I called pt and relayed the message below.   He verbalized understanding. He stated he felt better today.  He will take it easy.  He will replace the aspirin 325mg  to 81mg  dose as per below.

## 2014-03-10 ENCOUNTER — Ambulatory Visit: Payer: Medicare Other | Admitting: Internal Medicine

## 2014-03-11 ENCOUNTER — Ambulatory Visit: Payer: Medicare Other | Admitting: Internal Medicine

## 2014-03-19 ENCOUNTER — Ambulatory Visit (INDEPENDENT_AMBULATORY_CARE_PROVIDER_SITE_OTHER): Payer: Medicare Other | Admitting: Internal Medicine

## 2014-03-19 ENCOUNTER — Encounter: Payer: Self-pay | Admitting: Internal Medicine

## 2014-03-19 ENCOUNTER — Telehealth: Payer: Self-pay | Admitting: Internal Medicine

## 2014-03-19 VITALS — BP 160/78 | HR 76 | Temp 98.2°F | Ht 71.0 in | Wt 259.2 lb

## 2014-03-19 DIAGNOSIS — M542 Cervicalgia: Secondary | ICD-10-CM | POA: Diagnosis not present

## 2014-03-19 DIAGNOSIS — E1165 Type 2 diabetes mellitus with hyperglycemia: Secondary | ICD-10-CM

## 2014-03-19 DIAGNOSIS — I70219 Atherosclerosis of native arteries of extremities with intermittent claudication, unspecified extremity: Secondary | ICD-10-CM

## 2014-03-19 DIAGNOSIS — I10 Essential (primary) hypertension: Secondary | ICD-10-CM

## 2014-03-19 DIAGNOSIS — I119 Hypertensive heart disease without heart failure: Secondary | ICD-10-CM | POA: Insufficient documentation

## 2014-03-19 DIAGNOSIS — IMO0001 Reserved for inherently not codable concepts without codable children: Secondary | ICD-10-CM

## 2014-03-19 MED ORDER — TRAMADOL HCL 50 MG PO TABS: 50.0000 mg | ORAL_TABLET | Freq: Three times a day (TID) | ORAL | Status: DC | PRN

## 2014-03-19 MED ORDER — GLIPIZIDE ER 2.5 MG PO TB24: 2.5000 mg | ORAL_TABLET | Freq: Every day | ORAL | Status: DC

## 2014-03-19 MED ORDER — LISINOPRIL 5 MG PO TABS: 5.0000 mg | ORAL_TABLET | Freq: Every day | ORAL | Status: DC

## 2014-03-19 MED ORDER — KETOROLAC TROMETHAMINE 30 MG/ML IJ SOLN
30.0000 mg | Freq: Once | INTRAMUSCULAR | Status: AC
  Administered 2014-03-19: 30 mg via INTRAMUSCULAR

## 2014-03-19 NOTE — Progress Notes (Signed)
Subjective:    Patient ID: Brad [name], male    DOB: 1956-01-21, 58 y.o.   MRN: [id number]  HPI  Here to f/u, unfort had acute stroke with dizziness, right hand and right hand involvement, with slurrred speech.  Wife very urgent/nervously in telling me incidentally pt has today severe HA pain, right post occipital onset today, sharp, constant, with some radiation towards the area above right ear.  Worse to turn the head left and right. Cough and yelling makes it worse with increased throbbing. Admits he has short fuse, was yelling at the wife this am, now feels better, less angry.  Did have improved left HA previously with elavil. Pt denies other new neurological symptoms such as  facial or extremity weakness or numbness worse than previous.  No falls, no fevers, does have some recurring dizziness. Recent a1c much improved to 6.3.  Now on lipitor post cva, and also on new thyroid replacement.  Has f/u appt approx 2 mo Past Medical History  Diagnosis Date  . Depression   . GERD (gastroesophageal reflux disease)   . Diabetes mellitus type II   . COPD (chronic obstructive pulmonary disease)   . Ventral hernia   . Chronic LBP    Past Surgical History  Procedure Laterality Date  . Neg stress test  2000  . Rotator cuff repair  2009    multiple f/u Sxs/infection Tx  . Incision and drainage perirectal abscess  03/2009    reports that he has been smoking Cigarettes.  He has a 60 pack-year smoking history. He has never used smokeless tobacco. He reports that he drinks alcohol. He reports that he does not use illicit drugs. family history includes Alcohol abuse in his father; Diabetes type II in his mother; Heart disease in his mother; Lung cancer in his father, maternal grandmother, and sister. Allergies  Allergen Reactions  . Doxycycline Hives and Itching  . Oxycodone Itching   Current Outpatient Prescriptions on File Prior to Visit  Medication Sig Dispense Refill  . albuterol (PROVENTIL  HFA;VENTOLIN HFA) 108 (90 BASE) MCG/ACT inhaler Inhale 2 puffs into the lungs every 6 (six) hours as needed for wheezing or shortness of breath.  1 Inhaler  11  . aspirin EC 81 MG tablet Take 1 tablet (81 mg total) by mouth daily.  60 tablet  2  . atorvastatin (LIPITOR) 20 MG tablet Take 1 tablet (20 mg total) by mouth daily at 6 PM.  60 tablet  2  . cilostazol (PLETAL) 100 MG tablet Take 100 mg by mouth daily.      Marland Kitchen glucose blood test strip 1 each by Other route See admin instructions. Check blood sugar once daily.      . Lancets MISC 1 each by Other route See admin instructions. Check blood sugar once daily.      Marland Kitchen levothyroxine (SYNTHROID, LEVOTHROID) 50 MCG tablet Take 50 mcg by mouth daily before breakfast.      . metFORMIN (GLUCOPHAGE) 500 MG tablet Take 1,000 mg by mouth daily.      . mupirocin ointment (BACTROBAN) 2 % Apply 1 application topically daily.      . nicotine (NICODERM CQ - DOSED IN MG/24 HOURS) 21 mg/24hr patch Place 1 patch (21 mg total) onto the skin daily.  28 patch  0  . nortriptyline (PAMELOR) 10 MG capsule Take 1 capsule (10 mg total) by mouth at bedtime.  90 capsule  5  . tiotropium (SPIRIVA) 18 MCG inhalation capsule Place  18 mcg into inhaler and inhale 2 (two) times daily as needed (for wheezing).      Marland Kitchen tiZANidine (ZANAFLEX) 4 MG tablet Take 8 mg by mouth every 6 (six) hours as needed for muscle spasms.      . traMADol (ULTRAM) 50 MG tablet Take 50 mg by mouth every 8 (eight) hours as needed for moderate pain.       No current facility-administered medications on file prior to visit.   Review of Systems  Constitutional: Negative for unusual diaphoresis or other sweats  HENT: Negative for ringing in ear Eyes: Negative for double vision or worsening visual disturbance.  Respiratory: Negative for choking and stridor.   Gastrointestinal: Negative for vomiting or other signifcant bowel change Genitourinary: Negative for hematuria or decreased urine volume.    Musculoskeletal: Negative for other MSK pain or swelling Skin: Negative for color change and worsening wound.  Neurological: Negative for tremors and numbness other than noted  Psychiatric/Behavioral: Negative for decreased concentration or agitation other than above       Objective:   Physical Exam BP 160/78  Pulse 76  Temp(Src) 98.2 F (36.8 C) (Oral)  Ht 5\' 11"  (1.803 m)  Wt 259 lb 4 oz (117.595 kg)  BMI 36.17 kg/m2  SpO2 96% VS noted,  Constitutional: Pt appears well-developed, well-nourished.  HENT: Head: NCAT.  Right Ear: External ear normal.  Left Ear: External ear normal.  Eyes: . Pupils are equal, round, and reactive to light. Conjunctivae and EOM are normal Neck: Normal range of motion. Neck supple.  Cardiovascular: Normal rate and regular rhythm.   Pulmonary/Chest: Effort normal and breath sounds normal.  Abd:  Soft, NT, ND, + BS Neurological: Pt is alert. Not confused , motor with 3+/5 RUE and grip weakness, cn 2-12 intact, no slurred speech, gait not tested Mild tender right high cervical/low occipital tender without swelling, red, rash, pain worse to turn head left and right horizontally Skin: Skin is warm. No rash Psychiatric: Pt behavior is normal. No agitation.     Assessment & Plan:   Wt Readings from Last 3 Encounters:  03/19/14 259 lb 4 oz (117.595 kg)  02/28/14 260 lb (117.935 kg)  01/19/14 265 lb 12.8 oz (120.566 kg)

## 2014-03-19 NOTE — Assessment & Plan Note (Signed)
High right cervical/occipital - suspect flare of underlying prob c spine djd/ddd, neuro no change, hold on imaging, for toradol IM now, re-start muscle relaxer he has at home, f/u prn

## 2014-03-19 NOTE — Assessment & Plan Note (Signed)
Post recent cva, with hx of DM- to start lisinopril 5 qd, f/u next visit and at home

## 2014-03-19 NOTE — Progress Notes (Signed)
Pre visit review using our clinic review tool, if applicable. No additional management support is needed unless otherwise documented below in the visit note. 

## 2014-03-19 NOTE — Telephone Encounter (Signed)
Faxed hardcopy to [facility]

## 2014-03-19 NOTE — Telephone Encounter (Signed)
tramadol Done hardcopy to [facility]

## 2014-03-19 NOTE — Patient Instructions (Addendum)
You had the pain shot today (the toradol)  Please take the muscle relaxer you have at home  Please take all new medication as prescribed- the lisinopril 5 mg per day  Please continue all other medications as before, and refills have been done if requested.  Please have the pharmacy call with any other refills you may need.  Please continue your efforts at being more active, low cholesterol diet, and weight control.  Please keep your appointments with your specialists as you may have planned  Please return in 3 months, or sooner if needed

## 2014-03-20 ENCOUNTER — Telehealth: Payer: Self-pay | Admitting: Internal Medicine

## 2014-03-20 NOTE — Telephone Encounter (Signed)
Relevant patient education assigned to patient using Emmi. ° °

## 2014-03-22 NOTE — Assessment & Plan Note (Signed)
stable overall by history and exam, recent data reviewed with pt, and pt to continue medical treatment as before,  to f/u any worsening symptoms or concerns Lab Results  Component Value Date   HGBA1C 6.3* 02/28/2014

## 2014-03-30 ENCOUNTER — Ambulatory Visit: Payer: Medicaid Other | Admitting: Surgery

## 2014-04-03 ENCOUNTER — Ambulatory Visit: Payer: Self-pay | Admitting: Neurology

## 2014-04-20 ENCOUNTER — Encounter: Payer: Self-pay | Admitting: *Deleted

## 2014-04-20 ENCOUNTER — Other Ambulatory Visit (INDEPENDENT_AMBULATORY_CARE_PROVIDER_SITE_OTHER): Payer: Medicare Other

## 2014-04-20 ENCOUNTER — Ambulatory Visit: Payer: Medicare Other | Admitting: *Deleted

## 2014-04-20 VITALS — BP 142/80

## 2014-04-20 DIAGNOSIS — I10 Essential (primary) hypertension: Secondary | ICD-10-CM

## 2014-04-20 DIAGNOSIS — E039 Hypothyroidism, unspecified: Secondary | ICD-10-CM

## 2014-04-20 LAB — TSH: TSH: 5.3 u[IU]/mL — ABNORMAL HIGH (ref 0.35–4.50)

## 2014-04-22 ENCOUNTER — Encounter: Payer: Self-pay | Admitting: Internal Medicine

## 2014-04-22 ENCOUNTER — Other Ambulatory Visit: Payer: Self-pay | Admitting: Internal Medicine

## 2014-04-22 DIAGNOSIS — E038 Other specified hypothyroidism: Secondary | ICD-10-CM

## 2014-04-22 MED ORDER — LEVOTHYROXINE SODIUM 75 MCG PO TABS: 75.0000 ug | ORAL_TABLET | Freq: Every day | ORAL | Status: DC

## 2014-04-23 ENCOUNTER — Ambulatory Visit (INDEPENDENT_AMBULATORY_CARE_PROVIDER_SITE_OTHER): Payer: Medicare Other | Admitting: Internal Medicine

## 2014-04-23 ENCOUNTER — Encounter: Payer: Self-pay | Admitting: Internal Medicine

## 2014-04-23 VITALS — BP 138/80 | HR 68 | Temp 98.2°F | Wt 263.0 lb

## 2014-04-23 DIAGNOSIS — M722 Plantar fascial fibromatosis: Secondary | ICD-10-CM

## 2014-04-23 DIAGNOSIS — Z23 Encounter for immunization: Secondary | ICD-10-CM

## 2014-04-23 DIAGNOSIS — I10 Essential (primary) hypertension: Secondary | ICD-10-CM

## 2014-04-23 DIAGNOSIS — I5032 Chronic diastolic (congestive) heart failure: Secondary | ICD-10-CM | POA: Diagnosis not present

## 2014-04-23 DIAGNOSIS — I5030 Unspecified diastolic (congestive) heart failure: Secondary | ICD-10-CM | POA: Insufficient documentation

## 2014-04-23 DIAGNOSIS — M542 Cervicalgia: Secondary | ICD-10-CM

## 2014-04-23 DIAGNOSIS — I70219 Atherosclerosis of native arteries of extremities with intermittent claudication, unspecified extremity: Secondary | ICD-10-CM

## 2014-04-23 DIAGNOSIS — I509 Heart failure, unspecified: Secondary | ICD-10-CM

## 2014-04-23 MED ORDER — FUROSEMIDE 20 MG PO TABS: 20.0000 mg | ORAL_TABLET | Freq: Every day | ORAL | Status: DC

## 2014-04-23 NOTE — Progress Notes (Signed)
Subjective:    Patient ID: Brad [name], male    DOB: Jul 21, 1956, 58 y.o.   MRN: [id number]  HPI  Here to f/u; overall doing ok,  Pt denies chest pain, increased sob or doe, wheezing, orthopnea, PND, increased LE swelling, palpitations, dizziness or syncope.  Pt denies polydipsia, polyuria, or low sugar symptoms such as weakness or confusion improved with po intake.  Pt denies new neurological symptoms such as new headache, or facial or extremity weakness or numbness.   Pt states overall good compliance with meds, Neck pain from aug 13 resolved.  Also with left heel pain, mild to mod, worst with first steps in the AM, better later in the day, not better or worse with anything, for > 2 wks Past Medical History  Diagnosis Date  . Depression   . GERD (gastroesophageal reflux disease)   . Diabetes mellitus type II   . COPD (chronic obstructive pulmonary disease)   . Ventral hernia   . Chronic LBP    Past Surgical History  Procedure Laterality Date  . Neg stress test  2000  . Rotator cuff repair  2009    multiple f/u Sxs/infection Tx  . Incision and drainage perirectal abscess  03/2009    reports that he has been smoking Cigarettes.  He has a 60 pack-year smoking history. He has never used smokeless tobacco. He reports that he drinks alcohol. He reports that he does not use illicit drugs. family history includes Alcohol abuse in his father; Diabetes type II in his mother; Heart disease in his mother; Lung cancer in his father, maternal grandmother, and sister. Allergies  Allergen Reactions  . Doxycycline Hives and Itching  . Oxycodone Itching   Current Outpatient Prescriptions on File Prior to Visit  Medication Sig Dispense Refill  . albuterol (PROVENTIL HFA;VENTOLIN HFA) 108 (90 BASE) MCG/ACT inhaler Inhale 2 puffs into the lungs every 6 (six) hours as needed for wheezing or shortness of breath.  1 Inhaler  11  . aspirin EC 81 MG tablet Take 1 tablet (81 mg total) by mouth daily.   60 tablet  2  . atorvastatin (LIPITOR) 20 MG tablet Take 1 tablet (20 mg total) by mouth daily at 6 PM.  60 tablet  2  . cilostazol (PLETAL) 100 MG tablet Take 100 mg by mouth daily.      Marland Kitchen glipiZIDE (GLIPIZIDE XL) 2.5 MG 24 hr tablet Take 1 tablet (2.5 mg total) by mouth daily with breakfast. As needed for blood sugar > 200  90 tablet  3  . glucose blood test strip 1 each by Other route See admin instructions. Check blood sugar once daily.      . Lancets MISC 1 each by Other route See admin instructions. Check blood sugar once daily.      Marland Kitchen levothyroxine (SYNTHROID, LEVOTHROID) 75 MCG tablet Take 1 tablet (75 mcg total) by mouth daily.  90 tablet  3  . lisinopril (PRINIVIL,ZESTRIL) 5 MG tablet Take 1 tablet (5 mg total) by mouth daily.  90 tablet  3  . metFORMIN (GLUCOPHAGE) 500 MG tablet Take 1,000 mg by mouth daily.      . mupirocin ointment (BACTROBAN) 2 % Apply 1 application topically daily.      . nicotine (NICODERM CQ - DOSED IN MG/24 HOURS) 21 mg/24hr patch Place 1 patch (21 mg total) onto the skin daily.  28 patch  0  . nortriptyline (PAMELOR) 10 MG capsule Take 1 capsule (10 mg total) by  mouth at bedtime.  90 capsule  5  . tiotropium (SPIRIVA) 18 MCG inhalation capsule Place 18 mcg into inhaler and inhale 2 (two) times daily as needed (for wheezing).      Marland Kitchen tiZANidine (ZANAFLEX) 4 MG tablet Take 8 mg by mouth every 6 (six) hours as needed for muscle spasms.      . traMADol (ULTRAM) 50 MG tablet Take 1 tablet (50 mg total) by mouth every 8 (eight) hours as needed.  60 tablet  2   No current facility-administered medications on file prior to visit.    Review of Systems  Constitutional: Negative for unusual diaphoresis or other sweats  HENT: Negative for ringing in ear Eyes: Negative for double vision or worsening visual disturbance.  Respiratory: Negative for choking and stridor.   Gastrointestinal: Negative for vomiting or other signifcant bowel change Genitourinary: Negative for  hematuria or decreased urine volume.  Musculoskeletal: Negative for other MSK pain or swelling Skin: Negative for color change and worsening wound.  Neurological: Negative for tremors and numbness other than noted  Psychiatric/Behavioral: Negative for decreased concentration or agitation other than above       Objective:   Physical Exam BP 138/80  Pulse 68  Temp(Src) 98.2 F (36.8 C) (Oral)  Wt 263 lb (119.296 kg)  SpO2 96% VS noted,  Constitutional: Pt appears well-developed, well-nourished.  HENT: Head: NCAT.  Right Ear: External ear normal.  Left Ear: External ear normal.  Eyes: . Pupils are equal, round, and reactive to light. Conjunctivae and EOM are normal Neck: Normal range of motion. Neck supple.  Cardiovascular: Normal rate and regular rhythm.   Pulmonary/Chest: Effort normal and breath sounds normal.  Left plantar heel tender without swelling, red, or ulcer Neurological: Pt is alert. Not confused , motor grossly intact Skin: Skin is warm. No rash Psychiatric: Pt behavior is normal. No agitation.     Assessment & Plan:   Wt Readings from Last 3 Encounters:  04/23/14 263 lb (119.296 kg)  03/19/14 259 lb 4 oz (117.595 kg)  02/28/14 260 lb (117.935 kg)

## 2014-04-23 NOTE — Assessment & Plan Note (Signed)
Please try to "take the pressure" off the left heel as much as you can to help the left heel plantar fasciitis (tendonitis);  Weight loss, soft soled shoes, shoe inserts, heel cushions (or new gel shoe inserts), stretching excercises three times daily, and wearing a foot brace at night only can help;  Tylenol is for pain;  Please make an appointment with Dr Tamala Julian (sports medicine) in our office if this does not help, as you may need a steroid shot

## 2014-04-23 NOTE — Progress Notes (Signed)
Pre visit review using our clinic review tool, if applicable. No additional management support is needed unless otherwise documented below in the visit note. 

## 2014-04-23 NOTE — Patient Instructions (Addendum)
You had the flu shot today  Your legs are both more swollen and your weight is up 4 lbs since last visit - Please take all new medication as prescribed  - the low dose fluid pill for leg swelling daily as needed (even every day if the leg swelling just goes on and on) - this is for the Diastolic Congestive Heart Failure found on your recent Echocardiogram  Please try to "take the pressure" off the left heel as much as you can to help the left heel plantar fasciitis (tendonitis);  Weight loss, soft soled shoes, shoe inserts, heel cushions (or new gel shoe inserts), stretching excercises three times daily, and wearing a foot brace at night only can help;  Tylenol is for pain;  Please make an appointment with Dr Tamala Julian (sports medicine) in our office if this does not help, as you may need a steroid shot  Please continue all other medications as before, and refills have been done if requested.  Please have the pharmacy call with any other refills you may need.  Please keep your appointments with your specialists as you may have planned

## 2014-04-26 NOTE — Assessment & Plan Note (Signed)
stable overall by history and exam, recent data reviewed with pt, and pt to continue medical treatment as before,  to f/u any worsening symptoms or concerns BP Readings from Last 3 Encounters:  04/23/14 138/80  04/20/14 142/80  03/19/14 160/78

## 2014-04-26 NOTE — Assessment & Plan Note (Signed)
Resolved, c/w msk strain likely,  to f/u any worsening symptoms or concerns

## 2014-04-26 NOTE — Assessment & Plan Note (Signed)
stable overall by history and exam, and pt to continue medical treatment as before,  to f/u any worsening symptoms or concerns 

## 2014-04-27 ENCOUNTER — Ambulatory Visit: Payer: Medicaid Other | Admitting: Surgery

## 2014-05-05 ENCOUNTER — Other Ambulatory Visit: Payer: Self-pay | Admitting: Internal Medicine

## 2014-05-08 ENCOUNTER — Encounter: Payer: Self-pay | Admitting: Surgery

## 2014-05-11 ENCOUNTER — Ambulatory Visit: Payer: Medicaid Other | Admitting: Surgery

## 2014-05-22 ENCOUNTER — Encounter: Payer: Self-pay | Admitting: Surgery

## 2014-05-23 ENCOUNTER — Other Ambulatory Visit: Payer: Self-pay | Admitting: Surgery

## 2014-05-23 DIAGNOSIS — I739 Peripheral vascular disease, unspecified: Secondary | ICD-10-CM

## 2014-05-25 ENCOUNTER — Ambulatory Visit: Payer: Medicare Other | Admitting: Surgery

## 2014-05-25 ENCOUNTER — Encounter (INDEPENDENT_AMBULATORY_CARE_PROVIDER_SITE_OTHER): Payer: Self-pay

## 2014-05-25 ENCOUNTER — Ambulatory Visit (INDEPENDENT_AMBULATORY_CARE_PROVIDER_SITE_OTHER): Payer: Medicare Other | Admitting: Neurology

## 2014-05-25 ENCOUNTER — Encounter: Payer: Self-pay | Admitting: Neurology

## 2014-05-25 ENCOUNTER — Telehealth: Payer: Self-pay | Admitting: Internal Medicine

## 2014-05-25 VITALS — BP 191/82 | HR 71 | Ht 69.75 in | Wt 265.0 lb

## 2014-05-25 DIAGNOSIS — I10 Essential (primary) hypertension: Secondary | ICD-10-CM

## 2014-05-25 DIAGNOSIS — E1159 Type 2 diabetes mellitus with other circulatory complications: Secondary | ICD-10-CM | POA: Diagnosis not present

## 2014-05-25 DIAGNOSIS — I6302 Cerebral infarction due to thrombosis of basilar artery: Secondary | ICD-10-CM | POA: Diagnosis not present

## 2014-05-25 DIAGNOSIS — I70219 Atherosclerosis of native arteries of extremities with intermittent claudication, unspecified extremity: Secondary | ICD-10-CM

## 2014-05-25 DIAGNOSIS — E119 Type 2 diabetes mellitus without complications: Secondary | ICD-10-CM | POA: Insufficient documentation

## 2014-05-25 MED ORDER — BLOOD PRESSURE MONITOR DEVI: Status: AC

## 2014-05-25 MED ORDER — LISINOPRIL 20 MG PO TABS: 20.0000 mg | ORAL_TABLET | Freq: Every day | ORAL | Status: DC

## 2014-05-25 NOTE — Telephone Encounter (Signed)
Called pt spoke with wife inform monitor has been sent...Brad [name]

## 2014-05-25 NOTE — Progress Notes (Signed)
STROKE NEUROLOGY FOLLOW UP NOTE  NAME: Brad [name] DOB: 07/09/1956  REASON FOR VISIT: stroke follow up HISTORY FROM: chart and pt  Today we had the pleasure of seeing Brad [name] in follow-up at our Neurology Clinic. Pt was accompanied by wife.   History Summary Brad [name] is a 58 y.o. male with hx of HTN, DM, PVD and smoker was admitted late July this year for vertigo, right facial and hand numbness, and slurred speech. MRI showed questionable 2 mm acute left pontine infarct versus artifact. MRA showed right VA severe stenosis and possible occlusion. Strokes felt secondary to small vessel disease. He was discharged on ASA and continued on cilostazol, as well as lipitor. He was encouraged to quit smoking.  Interval History During the interval time, the patient has been doing well, no recurrent symptoms. However, he has not quit smoking, in fact, his wife also smokes at home and made him difficult to quit. He takes medicatons but he did not watch his diet, not check BP at home. Complains of headache at back of the head, R>L, location consistent with occipital neuralgia. Pt stated that he compliant with medication since discharge.  REVIEW OF SYSTEMS: Full 14 system review of systems performed and notable only for those listed below and in HPI above, all others are negative:  Constitutional: N/A  Cardiovascular: N/A  Ear/Nose/Throat: ringing in ears, spinning sensation  Skin: rash  Eyes: blurry vision  Respiratory: cough  Gastroitestinal: N/A  Genitourinary: N/A Hematology/Lymphatic: N/A  Endocrine: N/A  Musculoskeletal: aching muscles  Allergy/Immunology: N/A  Neurological: HA, dizzy  Psychiatric: N/A  The following represents the patient's updated allergies and side effects list: Allergies  Allergen Reactions  . Doxycycline Hives and Itching  . Oxycodone Itching    Labs since last visit of relevance include the following: Results for orders placed in  visit on 04/20/14  TSH      Result Value Ref Range   TSH 5.30 (*) 0.35 - 4.50 uIU/mL    The neurologically relevant items on the patient's problem list were reviewed on today's visit.  Neurologic Examination  A problem focused neurological exam (12 or more points of the single system neurologic examination, vital signs counts as 1 point, cranial nerves count for 8 points) was performed.  Blood pressure 191/82, pulse 71, height 5' 9.75" (1.772 m), weight 265 lb (120.203 kg).  General - Well nourished, well developed, in no apparent distress.  Ophthalmologic - Sharp disc margins OU.  Cardiovascular - Regular rate and rhythm with no murmur.  Mental Status -  Level of arousal and orientation to time, place, and person were intact. Language including expression, naming, repetition, comprehension was assessed and found intact. Attention span and concentration were normal. Fund of Knowledge was assessed and was intact.  Cranial Nerves II - XII - II - Visual field intact OU. III, IV, VI - Extraocular movements intact. V - Facial sensation intact bilaterally. VII - Facial movement intact bilaterally. VIII - Hearing & vestibular intact bilaterally. X - Palate elevates symmetrically. XI - Chin turning & shoulder shrug intact bilaterally. XII - Tongue protrusion intact.  Motor Strength - The patient's strength was normal in all extremities and pronator drift was absent.  Bulk was normal and fasciculations were absent.   Motor Tone - Muscle tone was assessed at the neck and appendages and was normal.  Reflexes - The patient's reflexes were normal in all extremities and he had no pathological reflexes.  Sensory - Light touch, temperature/pinprick, vibration and proprioception, and Romberg testing were assessed and were normal.    Coordination - The patient had normal movements in the hands and feet with no ataxia or dysmetria.  Tremor was absent.  Gait and Station - The patient's  transfers, posture, gait, station, and turns were observed as normal.  Data reviewed: I personally reviewed the images and agree with the radiology interpretations.  Dg Chest 2 View  02/28/2014  No edema or consolidation.  Ct Head Wo Contrast  02/28/2014  Remote appearing basal ganglia small lacunar infarcts. No acute intracranial finding by noncontrast CT.  Mr Angiogram Head Wo Contrast  02/28/2014  1. No sizable acute/subacute infarct. Question 2 mm acute left pontine infarct versus artifact.  2. Remote lacunar infarcts in the right thalamus and left puatmen.  3. Moderately motion degraded MRA. Small and irregular distal right vertebral artery with either severe stenosis or occlusion immediately proximal to the vertebrobasilar junction. Left vertebral artery is dominant and supplies the basilar.  4. Bilateral P2 stenoses, mild on the right and moderate on the left.  5. No evidence of significant ACA or MCA stenosis.  Carotid Doppler Preliminary report: Right: 1-39% ICA stenosis, highest end of range. Left: 60-79% ICA stenosis, highest end of range. Bilateral vertebral artery flow is antegrade.  2D Echocardiogram pending  EKG - Normal sinus rhythm rate 73 beats per minute.  Component     Latest Ref Rng 10/10/2013 02/28/2014 04/20/2014  Cholesterol     0 - 200 mg/dL 148    Triglycerides     0.0 - 149.0 mg/dL 431.0 (H)    HDL     >39.00 mg/dL 29.60 (L)    VLDL     0.0 - 40.0 mg/dL 86.2 (H)    LDL (calc)     0 - 99 mg/dL 32    Total CHOL/HDL Ratio      5    Hemoglobin A1C     <5.7 % 9.1 (H) 6.3 (H)   Mean Plasma Glucose     <117 mg/dL  134 (H)   TSH     0.35 - 4.50 uIU/mL 6.15 (H)  5.30 (H)    Assessment: As you may recall, he is a 58 y.o. Caucasian male with PMH of HTN, DM, PVD and smoker was admitted late July this year for vertigo, right facial and hand numbness, and slurred speech. MRI showed questionable 2 mm acute left pontine infarct versus artifact. MRA showed right VA  severe stenosis and possible occlusion. Strokes felt secondary to small vessel disease. Risk factor including HTN, DM, HLD as well as smoker, MRA showed vasculopathy. BP was high in clinic will increase lisinopril dose.   Plan:  - continue ASA and cilostazol for stroke and PVD prevention - continue lipitor of stroke prevention - quit smoking is again encouraged.  - monitor BP and glucose at home - follow up with PCP for stroke risk factor modification - increase your lisinopril to 20mg  a day. Please check your BP at home and adjust medication as per your PCP. - RTC PRN.  No orders of the defined types were placed in this encounter.    Meds ordered this encounter  Medications  . lisinopril (PRINIVIL,ZESTRIL) 20 MG tablet    Sig: Take 1 tablet (20 mg total) by mouth daily.    Dispense:  90 tablet    Refill:  3    Patient Instructions  - continue ASA and cilostazol for stroke and  PVD prevention - continue lipitor of stroke prevention - quit smoking is the key for stroke prevention - ask prescription for a home BP monitoring device to monitor BP at home - continue to monitor glucose at home - follow up with PCP for stroke risk factor modification - today your BP was high, I will increase your lisinopril to 20mg  a day. Please check your BP at home twice a day initially to get the trend and adjust medication as per your PCP. - follow up as needed.    Rosalin Hawking, MD PhD [facility] [address], [place], [place] [address] [phone]

## 2014-05-25 NOTE — Telephone Encounter (Signed)
Patient blood pressure has been high, last reading 191/81, [name] from [facility] increased it from 5 mg, to 20 mg, Patient stated that,Dr Erlinda Hong would like for you to prescribe him a blood pressure meter, so he can keep up with BP at home. Send to pharmacy [facility].

## 2014-05-25 NOTE — Patient Instructions (Addendum)
-   continue ASA and cilostazol for stroke and PVD prevention - continue lipitor of stroke prevention - quit smoking is the key for stroke prevention - ask prescription for a home BP monitoring device to monitor BP at home - continue to monitor glucose at home - follow up with PCP for stroke risk factor modification - today your BP was high, I will increase your lisinopril to 20mg  a day. Please check your BP at home twice a day initially to get the trend and adjust medication as per your PCP. - follow up as needed.

## 2014-05-29 ENCOUNTER — Encounter: Payer: Self-pay | Admitting: Surgery

## 2014-06-01 ENCOUNTER — Ambulatory Visit: Payer: Medicare Other | Admitting: Surgery

## 2014-06-09 ENCOUNTER — Other Ambulatory Visit: Payer: Self-pay | Admitting: Internal Medicine

## 2014-06-09 NOTE — Telephone Encounter (Signed)
Faxed hardcopy for Tramadol to [facility] [address] [place]

## 2014-06-09 NOTE — Telephone Encounter (Signed)
Done hardcopy to robin  

## 2014-06-12 ENCOUNTER — Ambulatory Visit: Payer: Self-pay | Admitting: Neurology

## 2014-06-18 ENCOUNTER — Other Ambulatory Visit: Payer: Self-pay | Admitting: Internal Medicine

## 2014-06-18 ENCOUNTER — Encounter: Payer: Self-pay | Admitting: Internal Medicine

## 2014-06-18 ENCOUNTER — Ambulatory Visit (INDEPENDENT_AMBULATORY_CARE_PROVIDER_SITE_OTHER): Payer: Medicare Other | Admitting: Internal Medicine

## 2014-06-18 ENCOUNTER — Ambulatory Visit (INDEPENDENT_AMBULATORY_CARE_PROVIDER_SITE_OTHER)
Admission: RE | Admit: 2014-06-18 | Discharge: 2014-06-18 | Disposition: A | Discharge status: Home / Self Care | Payer: Medicare Other | Source: Ambulatory Visit | Attending: Internal Medicine | Admitting: Internal Medicine

## 2014-06-18 ENCOUNTER — Telehealth: Payer: Self-pay | Admitting: Internal Medicine

## 2014-06-18 VITALS — BP 152/70 | HR 86 | Temp 97.7°F | Ht 71.0 in | Wt 269.5 lb

## 2014-06-18 DIAGNOSIS — R079 Chest pain, unspecified: Secondary | ICD-10-CM

## 2014-06-18 DIAGNOSIS — J208 Acute bronchitis due to other specified organisms: Secondary | ICD-10-CM

## 2014-06-18 DIAGNOSIS — I70219 Atherosclerosis of native arteries of extremities with intermittent claudication, unspecified extremity: Secondary | ICD-10-CM | POA: Diagnosis not present

## 2014-06-18 DIAGNOSIS — J438 Other emphysema: Secondary | ICD-10-CM | POA: Diagnosis not present

## 2014-06-18 DIAGNOSIS — J209 Acute bronchitis, unspecified: Secondary | ICD-10-CM | POA: Insufficient documentation

## 2014-06-18 DIAGNOSIS — R0781 Pleurodynia: Secondary | ICD-10-CM | POA: Diagnosis not present

## 2014-06-18 DIAGNOSIS — I10 Essential (primary) hypertension: Secondary | ICD-10-CM

## 2014-06-18 MED ORDER — LEVOFLOXACIN 250 MG PO TABS: 250.0000 mg | ORAL_TABLET | Freq: Every day | ORAL | Status: DC

## 2014-06-18 MED ORDER — HYDROCODONE-ACETAMINOPHEN 5-325 MG PO TABS: 1.0000 | ORAL_TABLET | Freq: Four times a day (QID) | ORAL | Status: DC | PRN

## 2014-06-18 MED ORDER — HYDROCODONE-HOMATROPINE 5-1.5 MG/5ML PO SYRP: 5.0000 mL | ORAL_SOLUTION | Freq: Four times a day (QID) | ORAL | Status: DC | PRN

## 2014-06-18 MED ORDER — KETOROLAC TROMETHAMINE 30 MG/ML IJ SOLN
30.0000 mg | Freq: Once | INTRAMUSCULAR | Status: AC
  Administered 2014-06-18: 30 mg via INTRAMUSCULAR

## 2014-06-18 NOTE — Assessment & Plan Note (Signed)
stable overall by history and exam, recent data reviewed with pt, and pt to continue medical treatment as before,  to f/u any worsening symptoms or concerns SpO2 Readings from Last 3 Encounters:  06/18/14 97%  04/23/14 96%  03/19/14 96%

## 2014-06-18 NOTE — Telephone Encounter (Signed)
I think ok in this case

## 2014-06-18 NOTE — Assessment & Plan Note (Signed)
Mild to mod, for antibx course,  to f/u any worsening symptoms or concerns 

## 2014-06-18 NOTE — Progress Notes (Signed)
Subjective:    Patient ID: Brad [name], male    DOB: 10/26/1955, 58 y.o.   MRN: [id number]  HPI  Here with acute onset mild to mod 2-3 days ST, HA, general weakness and malaise, with prod cough greenish sputum, but Pt denies increased sob or doe, wheezing, orthopnea, PND, increased LE swelling, palpitations, dizziness or syncope, but has had a pop and severe left chest pleuritic pain since x 2 days after trying to help lift a 89 woman off the floor. Very concerned about rib fx. Pain now 8/10. Past Medical History  Diagnosis Date  . Depression   . GERD (gastroesophageal reflux disease)   . Diabetes mellitus type II   . COPD (chronic obstructive pulmonary disease)   . Ventral hernia   . Chronic LBP    Past Surgical History  Procedure Laterality Date  . Neg stress test  2000  . Rotator cuff repair  2009    multiple f/u Sxs/infection Tx  . Incision and drainage perirectal abscess  03/2009    reports that he has been smoking Cigarettes.  He has a 60 pack-year smoking history. He has never used smokeless tobacco. He reports that he drinks alcohol. He reports that he does not use illicit drugs. family history includes Alcohol abuse in his father; Diabetes type II in his mother; Heart disease in his mother; Lung cancer in his father, maternal grandmother, and sister. Allergies  Allergen Reactions  . Doxycycline Hives and Itching  . Oxycodone Itching   Current Outpatient Prescriptions on File Prior to Visit  Medication Sig Dispense Refill  . albuterol (PROVENTIL HFA;VENTOLIN HFA) 108 (90 BASE) MCG/ACT inhaler Inhale 2 puffs into the lungs every 6 (six) hours as needed for wheezing or shortness of breath. 1 Inhaler 11  . aspirin EC 81 MG tablet Take 1 tablet (81 mg total) by mouth daily. 60 tablet 2  . atorvastatin (LIPITOR) 20 MG tablet Take 1 tablet (20 mg total) by mouth daily at 6 PM. 60 tablet 2  . Blood Pressure Monitor DEVI Use to check Blood pressure daily 1 Device 0  .  cilostazol (PLETAL) 100 MG tablet TAKE 1 TABLET BY MOUTH TWICE DAILY 60 tablet 9  . furosemide (LASIX) 20 MG tablet Take 1 tablet (20 mg total) by mouth daily. As needed for leg swelling 90 tablet 3  . glipiZIDE (GLIPIZIDE XL) 2.5 MG 24 hr tablet Take 1 tablet (2.5 mg total) by mouth daily with breakfast. As needed for blood sugar > 200 90 tablet 3  . glucose blood test strip 1 each by Other route See admin instructions. Check blood sugar once daily.    . Lancets MISC 1 each by Other route See admin instructions. Check blood sugar once daily.    Marland Kitchen levothyroxine (SYNTHROID, LEVOTHROID) 75 MCG tablet Take 1 tablet (75 mcg total) by mouth daily. 90 tablet 3  . lisinopril (PRINIVIL,ZESTRIL) 20 MG tablet Take 1 tablet (20 mg total) by mouth daily. 90 tablet 3  . metFORMIN (GLUCOPHAGE) 500 MG tablet Take 1,000 mg by mouth daily.    . mupirocin ointment (BACTROBAN) 2 % Apply 1 application topically daily.    . nortriptyline (PAMELOR) 10 MG capsule Take 1 capsule (10 mg total) by mouth at bedtime. 90 capsule 5  . tiotropium (SPIRIVA) 18 MCG inhalation capsule Place 18 mcg into inhaler and inhale 2 (two) times daily as needed (for wheezing).    Marland Kitchen tiZANidine (ZANAFLEX) 4 MG tablet Take 8 mg by mouth every  6 (six) hours as needed for muscle spasms.    Marland Kitchen tiZANidine (ZANAFLEX) 4 MG tablet TAKE 1 TABLET BY MOUTH EVERY 6 HOURS AS NEEDED FOR MUSCLE SPASMS 60 tablet 6  . traMADol (ULTRAM) 50 MG tablet TAKE 1 TABLET BY MOUTH EVERY 8 HOURS AS NEEDED 60 tablet 2   No current facility-administered medications on file prior to visit.    Review of Systems  Constitutional: Negative for unusual diaphoresis or other sweats  HENT: Negative for ringing in ear Eyes: Negative for double vision or worsening visual disturbance.  Respiratory: Negative for choking and stridor.   Gastrointestinal: Negative for vomiting or other signifcant bowel change Genitourinary: Negative for hematuria or decreased urine volume.    Musculoskeletal: Negative for other MSK pain or swelling Skin: Negative for color change and worsening wound.  Neurological: Negative for tremors and numbness other than noted  Psychiatric/Behavioral: Negative for decreased concentration or agitation other than above       Objective:   Physical Exam BP 152/70 mmHg  Pulse 86  Temp(Src) 97.7 F (36.5 C) (Oral)  Ht 5\' 11"  (1.803 m)  Wt 269 lb 8 oz (122.244 kg)  BMI 37.60 kg/m2  SpO2 97% VS noted,  Constitutional: Pt appears well-developed, well-nourished.  HENT: Head: NCAT.  Right Ear: External ear normal.  Left Ear: External ear normal.  Eyes: . Pupils are equal, round, and reactive to light. Conjunctivae and EOM are normal Neck: Normal range of motion. Neck supple.  Cardiovascular: Normal rate and regular rhythm.   Pulmonary/Chest: Effort normal and breath sounds somewhat decreased on left, no specific rales or wheezing Has palpable tenderness at the mid axillary line approx t8-11, without obvious bony abnormal or skin change, no swelling or redness.  Abd:  Soft, NT, ND, + BS Neurological: Pt is alert. Not confused , motor grossly intact Skin: Skin is warm. No rash Psychiatric: Pt behavior is normal. No agitation.     Assessment & Plan:

## 2014-06-18 NOTE — Progress Notes (Signed)
Pre visit review using our clinic review tool, if applicable. No additional management support is needed unless otherwise documented below in the visit note. 

## 2014-06-18 NOTE — Assessment & Plan Note (Signed)
Mild elev today likely situational, o/w stable overall by history and exam, recent data reviewed with pt, and pt to continue medical treatment as before,  to f/u any worsening symptoms or concerns BP Readings from Last 3 Encounters:  06/18/14 152/70  05/25/14 191/82  04/23/14 138/80

## 2014-06-18 NOTE — Telephone Encounter (Signed)
Pharmacist informed. 

## 2014-06-18 NOTE — Telephone Encounter (Signed)
Pharmacist Colletta Maryland from [facility] stated she felt the hydrocodone pills and cough syrup were a duplication and concerned of over medicating this patient.  She has filled the pills, but not the syrup until hearing back from PCP's office.

## 2014-06-18 NOTE — Patient Instructions (Signed)
You had the pain shot today (toradol)  Please take all new medication as prescribed - the antibiotic, cough medicine  Please go to the XRAY Department in the Basement (go straight as you get off the elevator) for the x-ray testing  Please continue all other medications as before, and refills have been done if requested.  Please have the pharmacy call with any other refills you may need.  Please keep your appointments with your specialists as you may have planned  You will be contacted by phone if any changes need to be made immediately.  Otherwise, you will receive a letter about your results with an explanation, but please check with MyChart first.  Please remember to sign up for MyChart if you have not done so, as this will be important to you in the future with finding out test results, communicating by private email, and scheduling acute appointments online when needed.

## 2014-06-18 NOTE — Assessment & Plan Note (Signed)
Suspect msk strain, cant r/o pna, for cxr/rib films, pain control with limited norco,  to f/u any worsening symptoms or concerns

## 2014-06-18 NOTE — Telephone Encounter (Signed)
[facility] calling to clarify medications prescribed. [phone]

## 2014-06-19 ENCOUNTER — Encounter: Payer: Self-pay | Admitting: Surgery

## 2014-06-22 ENCOUNTER — Encounter: Payer: Self-pay | Admitting: Surgery

## 2014-06-22 ENCOUNTER — Ambulatory Visit (INDEPENDENT_AMBULATORY_CARE_PROVIDER_SITE_OTHER): Payer: Medicare Other | Admitting: Surgery

## 2014-06-22 VITALS — BP 160/69 | HR 73 | Temp 98.1°F | Resp 18 | Ht 71.0 in | Wt 263.8 lb

## 2014-06-22 DIAGNOSIS — M79605 Pain in left leg: Secondary | ICD-10-CM

## 2014-06-22 DIAGNOSIS — M79604 Pain in right leg: Secondary | ICD-10-CM | POA: Diagnosis not present

## 2014-06-22 DIAGNOSIS — I70219 Atherosclerosis of native arteries of extremities with intermittent claudication, unspecified extremity: Secondary | ICD-10-CM | POA: Diagnosis not present

## 2014-06-22 DIAGNOSIS — I6523 Occlusion and stenosis of bilateral carotid arteries: Secondary | ICD-10-CM

## 2014-06-22 NOTE — Progress Notes (Signed)
Patient name: Brad [name] MRN: [id number] DOB: 09-06-1955 Sex: male     Chief Complaint  Patient presents with  . Carotid    Here for BLE tightness and cramping. Also, Has had daily visual disturbances, some dizziness. Each episode lasts for a couple of minutes.   Has seen [name] and [name] recently     HISTORY OF PRESENT ILLNESS: The patient is back today for follow-up of his bilateral claudication.  He was started on cilostazol.  He states that he did have some improvement but has not been good about taking it properly.  He complains of a lot of pressure at home which has prohibited him from taking good care of himself.  His blood sugars have not been well controlled.  He continues to smoke.  He has not been taking his medications properly.  The patient was admitted over the summer with a stroke.  He had symptoms of right-sided facial weakness and drooping.  He also had blurry vision.  Past Medical History  Diagnosis Date  . Depression   . GERD (gastroesophageal reflux disease)   . Diabetes mellitus type II   . COPD (chronic obstructive pulmonary disease)   . Ventral hernia   . Chronic LBP     Past Surgical History  Procedure Laterality Date  . Neg stress test  2000  . Rotator cuff repair  2009    multiple f/u Sxs/infection Tx  . Incision and drainage perirectal abscess  03/2009    History   Social History  . Marital Status: Single    Spouse Name: N/A    Number of Children: 0  . Years of Education: N/A   Occupational History  . Disabled    Social History Main Topics  . Smoking status: Current Every Day Smoker -- 1.50 packs/day for 40 years    Types: Cigarettes  . Smokeless tobacco: Never Used     Comment: 1-2 pks per day  . Alcohol Use: Yes     Comment: Social  . Drug Use: No  . Sexual Activity: Not on file   Other Topics Concern  . Not on file   Social History Narrative   Divorced with fiance.   Disabled since June 2010 - right shoulder   No  children.          Family History  Problem Relation Age of Onset  . Alcohol abuse Father   . Lung cancer Father   . Heart disease Mother     automated implantable cardioverter-defibrillator   . Diabetes type II Mother   . Lung cancer Maternal Grandmother   . Lung cancer Sister     Allergies as of 06/22/2014 - Review Complete 06/22/2014  Allergen Reaction Noted  . Doxycycline Hives and Itching 01/22/2013  . Oxycodone Itching 08/08/2011    Current Outpatient Prescriptions on File Prior to Visit  Medication Sig Dispense Refill  . albuterol (PROVENTIL HFA;VENTOLIN HFA) 108 (90 BASE) MCG/ACT inhaler Inhale 2 puffs into the lungs every 6 (six) hours as needed for wheezing or shortness of breath. 1 Inhaler 11  . aspirin EC 81 MG tablet Take 1 tablet (81 mg total) by mouth daily. 60 tablet 2  . atorvastatin (LIPITOR) 20 MG tablet Take 1 tablet (20 mg total) by mouth daily at 6 PM. 60 tablet 2  . Blood Pressure Monitor DEVI Use to check Blood pressure daily 1 Device 0  . cilostazol (PLETAL) 100 MG tablet TAKE 1 TABLET BY MOUTH  TWICE DAILY 60 tablet 9  . furosemide (LASIX) 20 MG tablet Take 1 tablet (20 mg total) by mouth daily. As needed for leg swelling 90 tablet 3  . glipiZIDE (GLIPIZIDE XL) 2.5 MG 24 hr tablet Take 1 tablet (2.5 mg total) by mouth daily with breakfast. As needed for blood sugar > 200 90 tablet 3  . glucose blood test strip 1 each by Other route See admin instructions. Check blood sugar once daily.    Marland Kitchen HYDROcodone-acetaminophen (NORCO/VICODIN) 5-325 MG per tablet Take 1 tablet by mouth every 6 (six) hours as needed for moderate pain. 35 tablet 0  . HYDROcodone-homatropine (HYCODAN) 5-1.5 MG/5ML syrup Take 5 mLs by mouth every 6 (six) hours as needed. 180 mL 0  . Lancets MISC 1 each by Other route See admin instructions. Check blood sugar once daily.    Marland Kitchen levofloxacin (LEVAQUIN) 250 MG tablet Take 1 tablet (250 mg total) by mouth daily. 10 tablet 0  . levothyroxine  (SYNTHROID, LEVOTHROID) 75 MCG tablet Take 1 tablet (75 mcg total) by mouth daily. 90 tablet 3  . lisinopril (PRINIVIL,ZESTRIL) 20 MG tablet Take 1 tablet (20 mg total) by mouth daily. 90 tablet 3  . metFORMIN (GLUCOPHAGE) 500 MG tablet Take 1,000 mg by mouth daily.    . mupirocin ointment (BACTROBAN) 2 % Apply 1 application topically daily.    . nortriptyline (PAMELOR) 10 MG capsule Take 1 capsule (10 mg total) by mouth at bedtime. 90 capsule 5  . tiotropium (SPIRIVA) 18 MCG inhalation capsule Place 18 mcg into inhaler and inhale 2 (two) times daily as needed (for wheezing).    Marland Kitchen tiZANidine (ZANAFLEX) 4 MG tablet Take 8 mg by mouth every 6 (six) hours as needed for muscle spasms.    Marland Kitchen tiZANidine (ZANAFLEX) 4 MG tablet TAKE 1 TABLET BY MOUTH EVERY 6 HOURS AS NEEDED FOR MUSCLE SPASMS 60 tablet 6  . traMADol (ULTRAM) 50 MG tablet TAKE 1 TABLET BY MOUTH EVERY 8 HOURS AS NEEDED 60 tablet 2   No current facility-administered medications on file prior to visit.     REVIEW OF SYSTEMS: Cardiovascular: positive for pain in legs and walking and when lying flat, leg swelling.  Positive for recent stroke. Pulmonary: positive for productive cough, asthma or wheezing. Neurologic: No weakness, paresthesias, aphasia, or amaurosis. positive dizziness. Hematologic: No bleeding problems or clotting disorders. Musculoskeletal: No joint pain or joint swelling. Gastrointestinal: No blood in stool or hematemesis Genitourinary: No dysuria or hematuria. Psychiatric:: No history of major depression. Integumentary: No rashes or ulcers. Constitutional: No fever or chills.  PHYSICAL EXAMINATION:   Vital signs are BP 160/69 mmHg  Pulse 73  Temp(Src) 98.1 F (36.7 C) (Oral)  Resp 18  Ht 5\' 11"  (1.803 m)  Wt 263 lb 12.8 oz (119.659 kg)  BMI 36.81 kg/m2  SpO2 100% General: The patient appears their stated age. HEENT:  No gross abnormalities Pulmonary:  Non labored breathing Abdomen: Soft and  non-tender Musculoskeletal: There are no major deformities. Neurologic: No focal weakness or paresthesias are detected, Skin: There are no ulcer or rashes noted. Psychiatric: The patient has normal affect. Cardiovascular: There is a regular rate and rhythm without significant murmur appreciated.no carotid bruit   Diagnostic Studies none  Assessment: Atherosclerosis with claudication History of stroke, carotid stenosis ( left) Plan: Atherosclerosis: The patient has not been compliant with therapy.  He feels that the cilostazol did alleviate his symptoms, but he has not been taking it as instructed.  He would like  to restart the medication and see how he does.  Stroke: I have reviewed his most recent carotid Dopplers which showed a 60-79% left carotid stenosis.  It was felt that the patient had a left brain stroke.  He was seen and evaluated by neurology, and they felt that his stroke was secondary to intracranial pathology rather than extracranial left carotid stenosis.  Left carotid endarterectomy versus stenting was not recommended at that time.  I will plan on having the patient follow up in 6 months with a repeat carotid ultrasound.  Eldridge Abrahams, M.D. [facility] Office: [phone] Pager:  [phone]

## 2014-06-23 ENCOUNTER — Ambulatory Visit: Payer: Medicare Other | Admitting: Internal Medicine

## 2014-07-14 ENCOUNTER — Telehealth: Payer: Self-pay | Admitting: Internal Medicine

## 2014-07-14 NOTE — Telephone Encounter (Signed)
Pt request phone call from the assistant concern about legs swollen and cramp. Offer an appt but pt wants to talk to the assistant before. Please call pt

## 2014-07-15 NOTE — Telephone Encounter (Signed)
Patient called in requesting call back in regards.

## 2014-07-16 NOTE — Telephone Encounter (Signed)
Needs OV - r/o electrolyte problem

## 2014-07-16 NOTE — Telephone Encounter (Signed)
Called pt, appt scheduled for 12/17 to see Dr [name], offered sooner appt however pt could not come until 12/17.

## 2014-07-16 NOTE — Telephone Encounter (Signed)
Returned patient's call. Patient stated that he is having a lot of leg cramps at night and his legs/feet are swelling about 3 hours after he gets up in the morning. Patient states that his legs/feet hurt while walking. Patient states that he has had symptoms for about week and half. Patient also states that he is compliant with his current medications. Patient is requesting your advise about his symptoms, whether he needs med adjustment (Lasix). Please advise?

## 2014-07-23 ENCOUNTER — Ambulatory Visit: Payer: Medicare Other | Admitting: Internal Medicine

## 2014-08-27 ENCOUNTER — Ambulatory Visit (INDEPENDENT_AMBULATORY_CARE_PROVIDER_SITE_OTHER): Payer: Medicare Other | Admitting: Internal Medicine

## 2014-08-27 ENCOUNTER — Encounter: Payer: Self-pay | Admitting: Internal Medicine

## 2014-08-27 VITALS — BP 144/68 | HR 89 | Temp 98.0°F | Ht 71.0 in | Wt 280.8 lb

## 2014-08-27 DIAGNOSIS — E785 Hyperlipidemia, unspecified: Secondary | ICD-10-CM | POA: Insufficient documentation

## 2014-08-27 DIAGNOSIS — M79604 Pain in right leg: Secondary | ICD-10-CM | POA: Diagnosis not present

## 2014-08-27 DIAGNOSIS — E039 Hypothyroidism, unspecified: Secondary | ICD-10-CM | POA: Diagnosis not present

## 2014-08-27 DIAGNOSIS — I5033 Acute on chronic diastolic (congestive) heart failure: Secondary | ICD-10-CM

## 2014-08-27 DIAGNOSIS — I6523 Occlusion and stenosis of bilateral carotid arteries: Secondary | ICD-10-CM | POA: Insufficient documentation

## 2014-08-27 DIAGNOSIS — M79605 Pain in left leg: Secondary | ICD-10-CM

## 2014-08-27 DIAGNOSIS — I6529 Occlusion and stenosis of unspecified carotid artery: Secondary | ICD-10-CM

## 2014-08-27 HISTORY — DX: Hypothyroidism, unspecified: E03.9

## 2014-08-27 HISTORY — DX: Hyperlipidemia, unspecified: E78.5

## 2014-08-27 HISTORY — DX: Occlusion and stenosis of unspecified carotid artery: I65.29

## 2014-08-27 MED ORDER — SIMVASTATIN 40 MG PO TABS: 40.0000 mg | ORAL_TABLET | Freq: Every day | ORAL | Status: DC

## 2014-08-27 MED ORDER — TRAMADOL HCL 50 MG PO TABS: ORAL_TABLET | ORAL | Status: DC

## 2014-08-27 MED ORDER — FUROSEMIDE 40 MG PO TABS: ORAL_TABLET | ORAL | Status: DC

## 2014-08-27 MED ORDER — LEVOTHYROXINE SODIUM 88 MCG PO TABS: 88.0000 ug | ORAL_TABLET | Freq: Every day | ORAL | Status: DC

## 2014-08-27 NOTE — Patient Instructions (Signed)
OK to stop the lasix 20 mg, and the lipitor  Please take all new medication as prescribed - the higher dose Lasix 40 mg every morning, and can take a Second dose later in the day if swelling still persists in the legs  Please take all new medication as prescribed - the simvastatin for high cholesterol, as well as the higher strength thyroid medication  OK to take the tramadol as prescribed  Please continue all other medications as before  Please have the pharmacy call with any other refills you may need.  Please keep your appointments with your specialists as you may have planned  Please return in 1 months, or sooner if needed

## 2014-08-27 NOTE — Progress Notes (Signed)
Subjective:    Patient ID: Brad [name], male    DOB: 1955/11/09, 59 y.o.   MRN: [id number]  HPI  Here to f/u, unfortunately has Gained wt assoc with worsening LE edema, but Pt denies chest pain, increased sob or doe, wheezing, orthopnea, PND,  palpitations, dizziness or syncope.. Drinks Plenty of fluids every day, thirsty or not Wt Readings from Last 3 Encounters:  08/27/14 280 lb 12 oz (127.347 kg)  06/22/14 263 lb 12.8 oz (119.659 kg)  06/18/14 269 lb 8 oz (122.244 kg)  Could not tolerate lipitor due to diffue myalgias worse to the prox legs, stopped several wks ago, pain now resolved.  Chronic pain persists - Pt continues to have recurring LBP without change in severity, bowel or bladder change, fever, wt loss,  worsening LE pain/numbness/weakness, gait change or falls.  Denies hyper or hypo thyroid symptoms such as voice, skin or hair change.   Pt denies fever, wt loss, night sweats, loss of appetite, or other constitutional symptoms Past Medical History  Diagnosis Date  . Depression   . GERD (gastroesophageal reflux disease)   . Diabetes mellitus type II   . COPD (chronic obstructive pulmonary disease)   . Ventral hernia   . Chronic LBP   . Hyperlipidemia 08/27/2014  . Hypothyroidism 08/27/2014  . Carotid stenosis 08/27/2014   Past Surgical History  Procedure Laterality Date  . Neg stress test  2000  . Rotator cuff repair  2009    multiple f/u Sxs/infection Tx  . Incision and drainage perirectal abscess  03/2009    reports that he has been smoking Cigarettes.  He has a 60 pack-year smoking history. He has never used smokeless tobacco. He reports that he drinks alcohol. He reports that he does not use illicit drugs. family history includes Alcohol abuse in his father; Diabetes type II in his mother; Heart disease in his mother; Lung cancer in his father, maternal grandmother, and sister. Allergies  Allergen Reactions  . Doxycycline Hives and Itching  . Lipitor  [Atorvastatin] Other (See Comments)    Myalgia   . Oxycodone Itching   Current Outpatient Prescriptions on File Prior to Visit  Medication Sig Dispense Refill  . albuterol (PROVENTIL HFA;VENTOLIN HFA) 108 (90 BASE) MCG/ACT inhaler Inhale 2 puffs into the lungs every 6 (six) hours as needed for wheezing or shortness of breath. 1 Inhaler 11  . aspirin EC 81 MG tablet Take 1 tablet (81 mg total) by mouth daily. 60 tablet 2  . Blood Pressure Monitor DEVI Use to check Blood pressure daily 1 Device 0  . cilostazol (PLETAL) 100 MG tablet TAKE 1 TABLET BY MOUTH TWICE DAILY 60 tablet 9  . glipiZIDE (GLIPIZIDE XL) 2.5 MG 24 hr tablet Take 1 tablet (2.5 mg total) by mouth daily with breakfast. As needed for blood sugar > 200 90 tablet 3  . glucose blood test strip 1 each by Other route See admin instructions. Check blood sugar once daily.    Marland Kitchen HYDROcodone-homatropine (HYCODAN) 5-1.5 MG/5ML syrup Take 5 mLs by mouth every 6 (six) hours as needed. 180 mL 0  . Lancets MISC 1 each by Other route See admin instructions. Check blood sugar once daily.    Marland Kitchen levofloxacin (LEVAQUIN) 250 MG tablet Take 1 tablet (250 mg total) by mouth daily. 10 tablet 0  . lisinopril (PRINIVIL,ZESTRIL) 20 MG tablet Take 1 tablet (20 mg total) by mouth daily. 90 tablet 3  . metFORMIN (GLUCOPHAGE) 500 MG tablet Take 1,000 mg  by mouth daily.    . mupirocin ointment (BACTROBAN) 2 % Apply 1 application topically daily.    . nortriptyline (PAMELOR) 10 MG capsule Take 1 capsule (10 mg total) by mouth at bedtime. 90 capsule 5  . tiotropium (SPIRIVA) 18 MCG inhalation capsule Place 18 mcg into inhaler and inhale 2 (two) times daily as needed (for wheezing).    Marland Kitchen tiZANidine (ZANAFLEX) 4 MG tablet Take 8 mg by mouth every 6 (six) hours as needed for muscle spasms.     No current facility-administered medications on file prior to visit.     Review of Systems  Constitutional: Negative for unusual diaphoresis or other sweats  HENT:  Negative for ringing in ear Eyes: Negative for double vision or worsening visual disturbance.  Respiratory: Negative for choking and stridor.   Gastrointestinal: Negative for vomiting or other signifcant bowel change Genitourinary: Negative for hematuria or decreased urine volume.  Musculoskeletal: Negative for other MSK pain or swelling Skin: Negative for color change and worsening wound.  Neurological: Negative for tremors and numbness other than noted  Psychiatric/Behavioral: Negative for decreased concentration or agitation other than above       Objective:   Physical Exam BP 144/68 mmHg  Pulse 89  Temp(Src) 98 F (36.7 C) (Oral)  Ht 5\' 11"  (1.803 m)  Wt 280 lb 12 oz (127.347 kg)  BMI 39.17 kg/m2  SpO2 98% VS noted,  Constitutional: Pt appears well-developed, well-nourished.  HENT: Head: NCAT.  Right Ear: External ear normal.  Left Ear: External ear normal.  Eyes: . Pupils are equal, round, and reactive to light. Conjunctivae and EOM are normal Neck: Normal range of motion. Neck supple.  Cardiovascular: Normal rate and regular rhythm.   Pulmonary/Chest: Effort normal and breath sounds without rales or wheezing.  Neurological: Pt is alert. Not confused , motor grossly intact Skin: 2-3+ edema to mid thighs right > left, no weepiness , ulcer Psychiatric: Pt behavior is normal. No agitation.     Assessment & Plan:

## 2014-08-27 NOTE — Progress Notes (Signed)
Pre visit review using our clinic review tool, if applicable. No additional management support is needed unless otherwise documented below in the visit note. 

## 2014-08-28 NOTE — Assessment & Plan Note (Signed)
For increased lasix 40 qam and 40 pm prn persistent swelling,  to f/u any worsening symptoms or concerns

## 2014-08-28 NOTE — Assessment & Plan Note (Signed)
Lab Results  Component Value Date   TSH 5.30* 04/20/2014   For increased levothyroxine, f/u lab next mo

## 2014-08-28 NOTE — Assessment & Plan Note (Signed)
stable overall by history and exam, recent data reviewed with pt, and pt to change lipitor to simvastatin,  to f/u any worsening symptoms or concerns, f/u labs next visit Lab Results  Component Value Date   LDLCALC 63 03/01/2014

## 2014-08-28 NOTE — Assessment & Plan Note (Signed)
cant r/o related to chronic LBP, neuro no change, for tramadol refill,  to f/u any worsening symptoms or concerns

## 2014-09-07 DIAGNOSIS — Z9289 Personal history of other medical treatment: Secondary | ICD-10-CM

## 2014-09-07 HISTORY — DX: Personal history of other medical treatment: Z92.89

## 2014-09-23 ENCOUNTER — Inpatient Hospital Stay (HOSPITAL_COMMUNITY)
Admission: EM | Admit: 2014-09-23 | Discharge: 2014-09-25 | DRG: 069 | Disposition: A | Discharge status: Home / Self Care | Payer: Medicare Other | Attending: Internal Medicine | Admitting: Internal Medicine

## 2014-09-23 ENCOUNTER — Emergency Department (HOSPITAL_COMMUNITY): Payer: Medicare Other

## 2014-09-23 ENCOUNTER — Inpatient Hospital Stay (HOSPITAL_COMMUNITY): Payer: Medicare Other

## 2014-09-23 ENCOUNTER — Encounter (HOSPITAL_COMMUNITY): Payer: Self-pay | Admitting: Family Medicine

## 2014-09-23 DIAGNOSIS — I639 Cerebral infarction, unspecified: Secondary | ICD-10-CM | POA: Diagnosis not present

## 2014-09-23 DIAGNOSIS — Z79899 Other long term (current) drug therapy: Secondary | ICD-10-CM

## 2014-09-23 DIAGNOSIS — Z6837 Body mass index (BMI) 37.0-37.9, adult: Secondary | ICD-10-CM | POA: Diagnosis not present

## 2014-09-23 DIAGNOSIS — E1165 Type 2 diabetes mellitus with hyperglycemia: Secondary | ICD-10-CM | POA: Diagnosis present

## 2014-09-23 DIAGNOSIS — J449 Chronic obstructive pulmonary disease, unspecified: Secondary | ICD-10-CM | POA: Diagnosis present

## 2014-09-23 DIAGNOSIS — I10 Essential (primary) hypertension: Secondary | ICD-10-CM

## 2014-09-23 DIAGNOSIS — F17210 Nicotine dependence, cigarettes, uncomplicated: Secondary | ICD-10-CM | POA: Diagnosis present

## 2014-09-23 DIAGNOSIS — Z720 Tobacco use: Secondary | ICD-10-CM

## 2014-09-23 DIAGNOSIS — Z885 Allergy status to narcotic agent status: Secondary | ICD-10-CM

## 2014-09-23 DIAGNOSIS — K2950 Unspecified chronic gastritis without bleeding: Secondary | ICD-10-CM | POA: Diagnosis not present

## 2014-09-23 DIAGNOSIS — K264 Chronic or unspecified duodenal ulcer with hemorrhage: Secondary | ICD-10-CM | POA: Diagnosis present

## 2014-09-23 DIAGNOSIS — Z881 Allergy status to other antibiotic agents status: Secondary | ICD-10-CM | POA: Diagnosis not present

## 2014-09-23 DIAGNOSIS — R531 Weakness: Secondary | ICD-10-CM | POA: Diagnosis not present

## 2014-09-23 DIAGNOSIS — R42 Dizziness and giddiness: Secondary | ICD-10-CM | POA: Diagnosis not present

## 2014-09-23 DIAGNOSIS — R202 Paresthesia of skin: Secondary | ICD-10-CM | POA: Diagnosis not present

## 2014-09-23 DIAGNOSIS — E039 Hypothyroidism, unspecified: Secondary | ICD-10-CM | POA: Diagnosis present

## 2014-09-23 DIAGNOSIS — R51 Headache: Secondary | ICD-10-CM | POA: Diagnosis not present

## 2014-09-23 DIAGNOSIS — R4781 Slurred speech: Secondary | ICD-10-CM | POA: Diagnosis not present

## 2014-09-23 DIAGNOSIS — F329 Major depressive disorder, single episode, unspecified: Secondary | ICD-10-CM | POA: Diagnosis present

## 2014-09-23 DIAGNOSIS — D500 Iron deficiency anemia secondary to blood loss (chronic): Secondary | ICD-10-CM | POA: Diagnosis present

## 2014-09-23 DIAGNOSIS — Z888 Allergy status to other drugs, medicaments and biological substances status: Secondary | ICD-10-CM

## 2014-09-23 DIAGNOSIS — E1159 Type 2 diabetes mellitus with other circulatory complications: Secondary | ICD-10-CM | POA: Diagnosis not present

## 2014-09-23 DIAGNOSIS — M545 Low back pain: Secondary | ICD-10-CM | POA: Diagnosis present

## 2014-09-23 DIAGNOSIS — I739 Peripheral vascular disease, unspecified: Secondary | ICD-10-CM | POA: Diagnosis present

## 2014-09-23 DIAGNOSIS — E785 Hyperlipidemia, unspecified: Secondary | ICD-10-CM | POA: Diagnosis present

## 2014-09-23 DIAGNOSIS — D649 Anemia, unspecified: Secondary | ICD-10-CM | POA: Insufficient documentation

## 2014-09-23 DIAGNOSIS — K219 Gastro-esophageal reflux disease without esophagitis: Secondary | ICD-10-CM | POA: Diagnosis present

## 2014-09-23 DIAGNOSIS — I119 Hypertensive heart disease without heart failure: Secondary | ICD-10-CM | POA: Diagnosis present

## 2014-09-23 DIAGNOSIS — D509 Iron deficiency anemia, unspecified: Secondary | ICD-10-CM | POA: Diagnosis not present

## 2014-09-23 DIAGNOSIS — Z8673 Personal history of transient ischemic attack (TIA), and cerebral infarction without residual deficits: Secondary | ICD-10-CM

## 2014-09-23 DIAGNOSIS — E119 Type 2 diabetes mellitus without complications: Secondary | ICD-10-CM | POA: Diagnosis present

## 2014-09-23 DIAGNOSIS — G459 Transient cerebral ischemic attack, unspecified: Secondary | ICD-10-CM | POA: Diagnosis not present

## 2014-09-23 DIAGNOSIS — G451 Carotid artery syndrome (hemispheric): Secondary | ICD-10-CM | POA: Diagnosis not present

## 2014-09-23 DIAGNOSIS — Z7982 Long term (current) use of aspirin: Secondary | ICD-10-CM | POA: Diagnosis not present

## 2014-09-23 DIAGNOSIS — Z23 Encounter for immunization: Secondary | ICD-10-CM

## 2014-09-23 DIAGNOSIS — R739 Hyperglycemia, unspecified: Secondary | ICD-10-CM

## 2014-09-23 DIAGNOSIS — K263 Acute duodenal ulcer without hemorrhage or perforation: Secondary | ICD-10-CM | POA: Diagnosis not present

## 2014-09-23 DIAGNOSIS — I6789 Other cerebrovascular disease: Secondary | ICD-10-CM | POA: Diagnosis not present

## 2014-09-23 DIAGNOSIS — I6523 Occlusion and stenosis of bilateral carotid arteries: Secondary | ICD-10-CM | POA: Diagnosis present

## 2014-09-23 DIAGNOSIS — E669 Obesity, unspecified: Secondary | ICD-10-CM | POA: Diagnosis present

## 2014-09-23 DIAGNOSIS — G8929 Other chronic pain: Secondary | ICD-10-CM | POA: Diagnosis present

## 2014-09-23 LAB — COMPREHENSIVE METABOLIC PANEL WITH GFR
ALT: 30 U/L (ref 0–53)
AST: 38 U/L — ABNORMAL HIGH (ref 0–37)
Albumin: 2.5 g/dL — ABNORMAL LOW (ref 3.5–5.2)
Alkaline Phosphatase: 138 U/L — ABNORMAL HIGH (ref 39–117)
Anion gap: 7 (ref 5–15)
BUN: 15 mg/dL (ref 6–23)
CO2: 29 mmol/L (ref 19–32)
Calcium: 8.3 mg/dL — ABNORMAL LOW (ref 8.4–10.5)
Chloride: 96 mmol/L (ref 96–112)
Creatinine, Ser: 0.99 mg/dL (ref 0.50–1.35)
GFR calc Af Amer: >90 mL/min
GFR calc non Af Amer: 90 mL/min — ABNORMAL LOW
Glucose, Bld: 342 mg/dL — ABNORMAL HIGH (ref 70–99)
Potassium: 3.9 mmol/L (ref 3.5–5.1)
Sodium: 132 mmol/L — ABNORMAL LOW (ref 135–145)
Total Bilirubin: 0.5 mg/dL (ref 0.3–1.2)
Total Protein: 6.2 g/dL (ref 6.0–8.3)

## 2014-09-23 LAB — CBC
HCT: 25.6 % — ABNORMAL LOW (ref 39.0–52.0)
HCT: 23.5 % — ABNORMAL LOW (ref 39.0–52.0)
Hemoglobin: 7 g/dL — ABNORMAL LOW (ref 13.0–17.0)
Hemoglobin: 6.6 g/dL — CL (ref 13.0–17.0)
MCH: 18 pg — ABNORMAL LOW (ref 26.0–34.0)
MCH: 18.5 pg — ABNORMAL LOW (ref 26.0–34.0)
MCHC: 27.3 g/dL — ABNORMAL LOW (ref 30.0–36.0)
MCHC: 28.1 g/dL — ABNORMAL LOW (ref 30.0–36.0)
MCV: 66 fL — ABNORMAL LOW (ref 78.0–100.0)
MCV: 65.8 fL — ABNORMAL LOW (ref 78.0–100.0)
Platelets: 261 10*3/uL (ref 150–400)
Platelets: 223 10*3/uL (ref 150–400)
RBC: 3.88 MIL/uL — ABNORMAL LOW (ref 4.22–5.81)
RBC: 3.57 MIL/uL — ABNORMAL LOW (ref 4.22–5.81)
RDW: 18.1 % — ABNORMAL HIGH (ref 11.5–15.5)
RDW: 18 % — ABNORMAL HIGH (ref 11.5–15.5)
WBC: 7.9 10*3/uL (ref 4.0–10.5)
WBC: 6.5 10*3/uL (ref 4.0–10.5)

## 2014-09-23 LAB — DIFFERENTIAL
Basophils Absolute: 0.1 10*3/uL (ref 0.0–0.1)
Basophils Relative: 1 % (ref 0–1)
Eosinophils Absolute: 0.2 10*3/uL (ref 0.0–0.7)
Eosinophils Relative: 3 % (ref 0–5)
Lymphocytes Relative: 18 % (ref 12–46)
Lymphs Abs: 1.4 10*3/uL (ref 0.7–4.0)
Monocytes Absolute: 0.8 10*3/uL (ref 0.1–1.0)
Monocytes Relative: 10 % (ref 3–12)
Neutro Abs: 5.4 10*3/uL (ref 1.7–7.7)
Neutrophils Relative %: 68 % (ref 43–77)

## 2014-09-23 LAB — URINE MICROSCOPIC-ADD ON

## 2014-09-23 LAB — URINALYSIS, ROUTINE W REFLEX MICROSCOPIC
Bilirubin Urine: NEGATIVE
Glucose, UA: 500 mg/dL — AB
Hgb urine dipstick: NEGATIVE
Ketones, ur: NEGATIVE mg/dL
Leukocytes, UA: NEGATIVE
Nitrite: NEGATIVE
Protein, ur: >300 mg/dL — AB
Specific Gravity, Urine: 1.022 (ref 1.005–1.030)
Urobilinogen, UA: 0.2 mg/dL (ref 0.0–1.0)
pH: 5.5 (ref 5.0–8.0)

## 2014-09-23 LAB — I-STAT CHEM 8, ED
BUN: 17 mg/dL (ref 6–23)
Calcium, Ion: 1.07 mmol/L — ABNORMAL LOW (ref 1.12–1.23)
Chloride: 94 mmol/L — ABNORMAL LOW (ref 96–112)
Creatinine, Ser: 1 mg/dL (ref 0.50–1.35)
Glucose, Bld: 349 mg/dL — ABNORMAL HIGH (ref 70–99)
HCT: 27 % — ABNORMAL LOW (ref 39.0–52.0)
Hemoglobin: 9.2 g/dL — ABNORMAL LOW (ref 13.0–17.0)
Potassium: 4.1 mmol/L (ref 3.5–5.1)
Sodium: 133 mmol/L — ABNORMAL LOW (ref 135–145)
TCO2: 23 mmol/L (ref 0–100)

## 2014-09-23 LAB — SAMPLE TO BLOOD BANK

## 2014-09-23 LAB — RETICULOCYTES
RBC.: 4.15 MIL/uL — ABNORMAL LOW (ref 4.22–5.81)
Retic Count, Absolute: 120.4 10*3/uL (ref 19.0–186.0)
Retic Ct Pct: 2.9 % (ref 0.4–3.1)

## 2014-09-23 LAB — ETHANOL: Alcohol, Ethyl (B): <5 mg/dL (ref 0–9)

## 2014-09-23 LAB — RAPID URINE DRUG SCREEN, HOSP PERFORMED
Amphetamines: NOT DETECTED
Barbiturates: NOT DETECTED
Benzodiazepines: NOT DETECTED
Cocaine: NOT DETECTED
Opiates: NOT DETECTED
Tetrahydrocannabinol: NOT DETECTED

## 2014-09-23 LAB — I-STAT TROPONIN, ED: Troponin i, poc: 0.01 ng/mL (ref 0.00–0.08)

## 2014-09-23 LAB — PREPARE RBC (CROSSMATCH)

## 2014-09-23 LAB — APTT: aPTT: 30 s (ref 24–37)

## 2014-09-23 LAB — PROTIME-INR
INR: 1.05 (ref 0.00–1.49)
Prothrombin Time: 13.9 seconds (ref 11.6–15.2)

## 2014-09-23 LAB — GLUCOSE, CAPILLARY: Glucose-Capillary: 261 mg/dL — ABNORMAL HIGH (ref 70–99)

## 2014-09-23 MED ORDER — LEVOTHYROXINE SODIUM 88 MCG PO TABS
88.0000 ug | ORAL_TABLET | Freq: Every day | ORAL | Status: DC
  Administered 2014-09-24 – 2014-09-25 (×2): 88 ug via ORAL
  Filled 2014-09-23 (×2): qty 1

## 2014-09-23 MED ORDER — INSULIN ASPART 100 UNIT/ML ~~LOC~~ SOLN
0.0000 [IU] | Freq: Every day | SUBCUTANEOUS | Status: DC
  Administered 2014-09-23: 3 [IU] via SUBCUTANEOUS

## 2014-09-23 MED ORDER — STROKE: EARLY STAGES OF RECOVERY BOOK
Freq: Once | Status: AC
  Administered 2014-09-23: 17:00:00
  Filled 2014-09-23: qty 1

## 2014-09-23 MED ORDER — ACETAMINOPHEN 325 MG PO TABS: 650.0000 mg | ORAL_TABLET | ORAL | Status: DC | PRN

## 2014-09-23 MED ORDER — NICOTINE 21 MG/24HR TD PT24
21.0000 mg | MEDICATED_PATCH | Freq: Every day | TRANSDERMAL | Status: DC
  Administered 2014-09-23 – 2014-09-25 (×3): 21 mg via TRANSDERMAL
  Filled 2014-09-23 (×3): qty 1

## 2014-09-23 MED ORDER — ACETAMINOPHEN 650 MG RE SUPP: 650.0000 mg | RECTAL | Status: DC | PRN

## 2014-09-23 MED ORDER — ASPIRIN 300 MG RE SUPP: 300.0000 mg | Freq: Every day | RECTAL | Status: DC

## 2014-09-23 MED ORDER — PANTOPRAZOLE SODIUM 40 MG IV SOLR
40.0000 mg | Freq: Two times a day (BID) | INTRAVENOUS | Status: DC
  Administered 2014-09-23 – 2014-09-24 (×2): 40 mg via INTRAVENOUS
  Filled 2014-09-23 (×3): qty 40

## 2014-09-23 MED ORDER — ENOXAPARIN SODIUM 40 MG/0.4ML ~~LOC~~ SOLN: 40.0000 mg | SUBCUTANEOUS | Status: DC

## 2014-09-23 MED ORDER — HYDROCODONE-ACETAMINOPHEN 5-325 MG PO TABS
1.0000 | ORAL_TABLET | Freq: Four times a day (QID) | ORAL | Status: DC | PRN
  Administered 2014-09-23 – 2014-09-25 (×3): 1 via ORAL
  Filled 2014-09-23 (×3): qty 1

## 2014-09-23 MED ORDER — INSULIN ASPART 100 UNIT/ML ~~LOC~~ SOLN
0.0000 [IU] | Freq: Three times a day (TID) | SUBCUTANEOUS | Status: DC
  Administered 2014-09-24 – 2014-09-25 (×3): 3 [IU] via SUBCUTANEOUS

## 2014-09-23 MED ORDER — LABETALOL HCL 5 MG/ML IV SOLN: 10.0000 mg | INTRAVENOUS | Status: DC | PRN

## 2014-09-23 MED ORDER — ASPIRIN 325 MG PO TABS: 325.0000 mg | ORAL_TABLET | Freq: Every day | ORAL | Status: DC

## 2014-09-23 MED ORDER — SODIUM CHLORIDE 0.9 % IV SOLN
Freq: Once | INTRAVENOUS | Status: AC
  Administered 2014-09-23: 17:00:00 via INTRAVENOUS

## 2014-09-23 MED ORDER — SIMVASTATIN 40 MG PO TABS
40.0000 mg | ORAL_TABLET | Freq: Every day | ORAL | Status: DC
  Administered 2014-09-23 – 2014-09-24 (×2): 40 mg via ORAL
  Filled 2014-09-23 (×2): qty 1

## 2014-09-23 MED ORDER — INSULIN GLARGINE 100 UNIT/ML ~~LOC~~ SOLN
12.0000 [IU] | Freq: Every day | SUBCUTANEOUS | Status: DC
Start: 2014-09-23 — End: 2014-09-25
  Administered 2014-09-23 – 2014-09-24 (×2): 12 [IU] via SUBCUTANEOUS
  Filled 2014-09-23 (×3): qty 0.12

## 2014-09-23 MED ORDER — CILOSTAZOL 100 MG PO TABS
100.0000 mg | ORAL_TABLET | Freq: Two times a day (BID) | ORAL | Status: DC
  Administered 2014-09-24 (×2): 100 mg via ORAL
  Filled 2014-09-23 (×6): qty 1

## 2014-09-23 MED ORDER — PNEUMOCOCCAL VAC POLYVALENT 25 MCG/0.5ML IJ INJ: 0.5000 mL | INJECTION | INTRAMUSCULAR | Status: DC

## 2014-09-23 MED ORDER — SENNOSIDES-DOCUSATE SODIUM 8.6-50 MG PO TABS
1.0000 | ORAL_TABLET | Freq: Every evening | ORAL | Status: DC | PRN
Start: 2014-09-23 — End: 2014-09-25

## 2014-09-23 MED ORDER — TIZANIDINE HCL 4 MG PO TABS
8.0000 mg | ORAL_TABLET | Freq: Four times a day (QID) | ORAL | Status: DC | PRN
  Administered 2014-09-24 – 2014-09-25 (×3): 8 mg via ORAL
  Filled 2014-09-23 (×6): qty 2

## 2014-09-23 MED ORDER — NORTRIPTYLINE HCL 10 MG PO CAPS
10.0000 mg | ORAL_CAPSULE | Freq: Every day | ORAL | Status: DC
  Administered 2014-09-24 (×2): 10 mg via ORAL
  Filled 2014-09-23 (×4): qty 1

## 2014-09-23 NOTE — ED Notes (Signed)
Dr. Coralyn Pear at bedside.

## 2014-09-23 NOTE — H&P (Signed)
[facility] History and Physical  Brad [name] UXL:[id number] DOB: [birth year] DOA: 09/23/2014  Referring physician:  PCP: [name], MD   Chief Complaint: Slurred Speech   HPI: Brad [name] is a 59 y.o. male  with a history of tobacco abuse, type 2 diabetes mellitus, hypertension, carotid artery stenosis who presents to the emergency department with complaints of slurred speech and left facial numbness. He has a history of remote lacunar infarcts involving the right thalamus and left putamen based on MRI of brain performed on 02/28/2014. Doppler ultrasound performed on 03/01/2014 showed a left 60-79% ICA stenosis. Patient reporting been in his usual state health when he developed sudden onset left temporal pain associated with decrease vision to was left I along with slurred speech and left hemi-face numbness. He reported symptoms started at approximately 8 AM at which time he was sitting in his kitchen table. Symptoms gradually improved and resolved while in the emergency department. A CT scan of brain without contrast performed in the ER did not show evidence of acute intracranial findings. Lab work did reveal presence of anemia having an initial hemoglobin of 7.0 and 6.6 on recheck. He denies hemoptysis, hematemesis, black tarry stools, although reports having history of bright red blood per rectum which has attributed to hemorrhoids.                                               Review of Systems:  Constitutional:  No weight loss, night sweats, Fevers, chills, fatigue.  HEENT:  No headaches, Difficulty swallowing,Tooth/dental problems,Sore throat,  No sneezing, itching, ear ache, nasal congestion, post nasal drip,  Cardio-vascular:  No chest pain, Orthopnea, PND, swelling in lower extremities, anasarca, dizziness, palpitations  GI:  No heartburn, indigestion, abdominal pain, nausea, vomiting, diarrhea, change in bowel habits, loss of appetite  Resp:  No shortness of  breath with exertion or at rest. No excess mucus, no productive cough, No non-productive cough, No coughing up of blood.No change in color of mucus.No wheezing.No chest wall deformity  Skin:  no rash or lesions.  GU:  no dysuria, change in color of urine, no urgency or frequency. No flank pain.  Musculoskeletal:  No joint pain or swelling. No decreased range of motion. No back pain.  Psych:  No change in mood or affect. No depression or anxiety. No memory loss.   Past Medical History  Diagnosis Date  . Depression   . GERD (gastroesophageal reflux disease)   . Diabetes mellitus type II   . COPD (chronic obstructive pulmonary disease)   . Ventral hernia   . Chronic LBP   . Hyperlipidemia 08/27/2014  . Hypothyroidism 08/27/2014  . Carotid stenosis 08/27/2014   Past Surgical History  Procedure Laterality Date  . Neg stress test  2000  . Rotator cuff repair  2009    multiple f/u Sxs/infection Tx  . Incision and drainage perirectal abscess  03/2009   Social History:  reports that he has been smoking Cigarettes.  He has a 60 pack-year smoking history. He has never used smokeless tobacco. He reports that he drinks alcohol. He reports that he does not use illicit drugs.  Allergies  Allergen Reactions  . Doxycycline Hives and Itching  . Lipitor [Atorvastatin] Other (See Comments)    Myalgia   . Oxycodone Itching    Family History  Problem Relation Age of  Onset  . Alcohol abuse Father   . Lung cancer Father   . Heart disease Mother     automated implantable cardioverter-defibrillator   . Diabetes type II Mother   . Lung cancer Maternal Grandmother   . Lung cancer Sister     Prior to Admission medications   Medication Sig Start Date End Date Taking? Authorizing Provider  albuterol (PROVENTIL HFA;VENTOLIN HFA) 108 (90 BASE) MCG/ACT inhaler Inhale 2 puffs into the lungs every 6 (six) hours as needed for wheezing or shortness of breath. 10/10/13  Yes Biagio Borg, MD  aspirin EC 81  MG tablet Take 1 tablet (81 mg total) by mouth daily. 03/01/14  Yes Reyne Dumas, MD  Blood Pressure Monitor DEVI Use to check Blood pressure daily 05/25/14  Yes Biagio Borg, MD  cilostazol (PLETAL) 100 MG tablet TAKE 1 TABLET BY MOUTH TWICE DAILY 05/25/14  Yes Serafina Mitchell, MD  furosemide (LASIX) 40 MG tablet 1 tab by mouth in the AM, with a second dose in PM as needed only for swelling 08/27/14  Yes Biagio Borg, MD  glipiZIDE (GLIPIZIDE XL) 2.5 MG 24 hr tablet Take 1 tablet (2.5 mg total) by mouth daily with breakfast. As needed for blood sugar > 200 03/19/14  Yes Biagio Borg, MD  glucosamine-chondroitin 500-400 MG tablet Take 2 tablets by mouth daily.   Yes Historical Provider, MD  glucose blood test strip 1 each by Other route See admin instructions. Check blood sugar once daily.   Yes Historical Provider, MD  HYDROcodone-homatropine (HYCODAN) 5-1.5 MG/5ML syrup Take 5 mLs by mouth every 6 (six) hours as needed. Patient not taking: Reported on 09/23/2014 06/18/14   Biagio Borg, MD  ibuprofen (ADVIL,MOTRIN) 200 MG tablet Take 800 mg by mouth every 6 (six) hours as needed for mild pain or moderate pain.   Yes Historical Provider, MD  Lancets MISC 1 each by Other route See admin instructions. Check blood sugar once daily.   Yes Historical Provider, MD  levofloxacin (LEVAQUIN) 250 MG tablet Take 1 tablet (250 mg total) by mouth daily. Patient not taking: Reported on 09/23/2014 06/18/14   Biagio Borg, MD  levothyroxine (SYNTHROID, LEVOTHROID) 88 MCG tablet Take 1 tablet (88 mcg total) by mouth daily. 08/27/14  Yes Biagio Borg, MD  lisinopril (PRINIVIL,ZESTRIL) 20 MG tablet Take 1 tablet (20 mg total) by mouth daily. 05/25/14  Yes Rosalin Hawking, MD  metFORMIN (GLUCOPHAGE) 500 MG tablet Take 1,000 mg by mouth daily.   Yes Historical Provider, MD  nortriptyline (PAMELOR) 10 MG capsule Take 1 capsule (10 mg total) by mouth at bedtime. Patient not taking: Reported on 09/23/2014 10/10/13   Biagio Borg, MD    simvastatin (ZOCOR) 40 MG tablet Take 1 tablet (40 mg total) by mouth at bedtime. 08/27/14  Yes Biagio Borg, MD  tiotropium (SPIRIVA) 18 MCG inhalation capsule Place 18 mcg into inhaler and inhale 2 (two) times daily as needed (for wheezing).   Yes Historical Provider, MD  tiZANidine (ZANAFLEX) 4 MG tablet Take 8 mg by mouth every 6 (six) hours as needed for muscle spasms.   Yes Historical Provider, MD  traMADol Veatrice Bourbon) 50 MG tablet 1-2 tabs by mouth up to four times per day 08/27/14  Yes Biagio Borg, MD   Physical Exam: Filed Vitals:   09/23/14 1100 09/23/14 1115 09/23/14 1130 09/23/14 1145  BP: 144/61 149/65 147/64 143/56  Pulse: 74 75 74 72  Temp:  Resp: 23 23 22 20   SpO2: 100% 100% 100% 100%    Wt Readings from Last 3 Encounters:  08/27/14 127.347 kg (280 lb 12 oz)  06/22/14 119.659 kg (263 lb 12.8 oz)  06/18/14 122.244 kg (269 lb 8 oz)    General:  Appears calm and comfortable, no acute distress, follow commands. Eyes: PERRL, normal lids, irises & conjunctiva ENT: grossly normal hearing, lips & tongue Neck: no LAD, masses or thyromegaly Cardiovascular: RRR, no m/r/g. No LE edema. Telemetry: SR, no arrhythmias  Respiratory: CTA bilaterally with a few expiratory wheezes, no rales or rhonchi. Normal respiratory effort. Abdomen: soft, ntnd Skin: no rash or induration seen on limited exam Musculoskeletal: grossly normal tone BUE/BLE Psychiatric: grossly normal mood and affect, speech fluent and appropriate Neurologic: On my evaluation patient having a nonfocal neurologic exam. There was no obvious facial droop, slurred speech, visual acuity intact. Neck supple symmetrical, has 5 out of 5 muscle strength to bilateral upper and lower extremities without alteration to sensation.           Labs on Admission:  Basic Metabolic Panel:  Recent Labs Lab 09/23/14 0950 09/23/14 0956  NA 132* 133*  K 3.9 4.1  CL 96 94*  CO2 29  --   GLUCOSE 342* 349*  BUN 15 17  CREATININE  0.99 1.00  CALCIUM 8.3*  --    Liver Function Tests:  Recent Labs Lab 09/23/14 0950  AST 38*  ALT 30  ALKPHOS 138*  BILITOT 0.5  PROT 6.2  ALBUMIN 2.5*   No results for input(s): LIPASE, AMYLASE in the last 168 hours. No results for input(s): AMMONIA in the last 168 hours. CBC:  Recent Labs Lab 09/23/14 0950 09/23/14 0956 09/23/14 1115  WBC 7.9  --  6.5  NEUTROABS 5.4  --   --   HGB 7.0* 9.2* 6.6*  HCT 25.6* 27.0* 23.5*  MCV 66.0*  --  65.8*  PLT 261  --  223   Cardiac Enzymes: No results for input(s): CKTOTAL, CKMB, CKMBINDEX, TROPONINI in the last 168 hours.  BNP (last 3 results) No results for input(s): BNP in the last 8760 hours.  ProBNP (last 3 results) No results for input(s): PROBNP in the last 8760 hours.  CBG: No results for input(s): GLUCAP in the last 168 hours.  Radiological Exams on Admission: Ct Head Wo Contrast  09/23/2014   CLINICAL DATA:  Headache, dizziness, weakness. Visual change in the left eye with left-sided headache. Code stroke.  EXAM: CT HEAD WITHOUT CONTRAST  TECHNIQUE: Contiguous axial images were obtained from the base of the skull through the vertex without intravenous contrast.  COMPARISON:  02/28/2014  FINDINGS: Skull and Sinuses:Negative for fracture or destructive process. The mastoids, middle ears, and imaged paranasal sinuses are clear.  Orbits: No acute abnormality.  Brain: No evidence of acute infarction, hemorrhage, hydrocephalus, or mass lesion/mass effect. Remote lacunar infarct in the right thalamus and small remote ischemic focus in the left corona radiata. Low-attenuation along the lower right sylvian fissure is most consistent with a dilated perivascular space.  Since this is a code stroke, page to Dr. Nicole Kindred placed on 09/23/2014 at 10:04 am.  IMPRESSION: 1. No acute intracranial findings. 2. Chronic small vessel disease with remote right thalamus lacunar infarct.   Electronically Signed   By: Monte Fantasia M.D.   On:  09/23/2014 10:06    EKG: Independently reviewed.   Assessment/Plan Principal Problem:   Acute CVA (cerebrovascular accident) Active Problems:   Essential  hypertension, benign   Type 2 diabetes mellitus with other circulatory complications   Carotid stenosis   Stroke (cerebrum)   Tobacco abuse   Stroke   1. Suspected CVA versus TIA. Patient with history of multiple cardiovascular risk factors including ongoing tobacco abuse, diabetes mellitus, hypertension and dyslipidemia along with history of CVA presenting with left-sided facial weakness associated with slurred speech. Initial CT scan of brain showing no acute intracranial abnormalities. Will treat with aspirin 325 mg by mouth daily. He was seen and evaluated by neurology in the emergency department. Will place patient on CVA protocol, obtain MRI/MRA of brain, carotid Dopplers, transthoracic echocardiogram along with PT/OT/speech pathology consults. Continue statin therapy.  2. Acute anemia. Initial labs showing a hemoglobin of 7.0, 6.6 on recheck. Looking back his last CBC in our system was on 02/28/2014 at which time he had a hemoglobin of 12.1. Unfortunately patient refused rectal for hemoccult stool in the emergency department. He denies hematemesis, black tarry stools although reports history of bright red blood per rectum which she has attributed to hemorrhoids. Will type and cross and transfuse 2 units of packed red blood cells. Will guaiac his stools and check anemia panel. [facility] has been consulted. 3. Type 2 diabetes mellitus. Initial labs showing a glucose of 349, will perform Accu-Cheks AC and HS, start Lantus 12 units subcutaneous daily at bedtime, hold metformin therapy for now. 4. Hypertension. Initial blood pressures in the 140s, will provide as needed labetalol for elevated blood pressures. Hold ACE inhibitor therapy for now. 5. Ongoing tobacco abuse. Patient was extensively counseled on tobacco cessation 6. DVT  prophylaxis. SCDs    Code Status: Full Code Family Communication: Family not present Disposition Plan: Anticipate patient will require greater than 2 nights hospitalization  Time spent: 35 min  Kelvin Cellar [facility] Pager [phone]

## 2014-09-23 NOTE — Consult Note (Signed)
Admission H&P    Chief Complaint: Acute onset of slurred speech and left-sided weakness as well as visual changes.  HPI: Brad [name] is an 59 y.o. male history of previous strokes, diabetes mellitus, hypertension,, carotid stenosis and COPD, is entering with history of acute onset of slurred speech, left-sided weakness including facial droop, as well as visual changes in the left side at 8 AM this morning. He describes vision changes as blurring and spots. His wife indicated that he drags his left leg when he walked. He had previous stroke in July 2015. He has been on aspirin 81 mg per day. He also has peripheral vascular disease and has been on [place]. CT scan of his head showed old right thalamic infarction but no acute changes. NIH stroke score was 1 for residual sensory changes on the left. Other deficits resolved after arriving in the emergency room. He is being admitted for possible recurrent stroke.  LSN: 8 AM on 09/23/2014 tPA Given: No; rapid resolution of deficits  Past Medical History  Diagnosis Date  . Depression   . GERD (gastroesophageal reflux disease)   . Diabetes mellitus type II   . COPD (chronic obstructive pulmonary disease)   . Ventral hernia   . Chronic LBP   . Hyperlipidemia 08/27/2014  . Hypothyroidism 08/27/2014  . Carotid stenosis 08/27/2014    Past Surgical History  Procedure Laterality Date  . Neg stress test  2000  . Rotator cuff repair  2009    multiple f/u Sxs/infection Tx  . Incision and drainage perirectal abscess  03/2009    Family History  Problem Relation Age of Onset  . Alcohol abuse Father   . Lung cancer Father   . Heart disease Mother     automated implantable cardioverter-defibrillator   . Diabetes type II Mother   . Lung cancer Maternal Grandmother   . Lung cancer Sister    Social History:  reports that he has been smoking Cigarettes.  He has a 60 pack-year smoking history. He has never used smokeless tobacco. He reports that he  drinks alcohol. He reports that he does not use illicit drugs.  Allergies:  Allergies  Allergen Reactions  . Doxycycline Hives and Itching  . Lipitor [Atorvastatin] Other (See Comments)    Myalgia   . Oxycodone Itching    Medications: Patient's medications prior to admission were reviewed by me.  ROS: History obtained from spouse and the patient  General ROS: negative for - chills, fatigue, fever, night sweats, weight gain or weight loss Psychological ROS: negative for - behavioral disorder, hallucinations, memory difficulties, mood swings or suicidal ideation Ophthalmic ROS: negative for - blurry vision, double vision, eye pain or loss of vision ENT ROS: negative for - epistaxis, nasal discharge, oral lesions, sore throat, tinnitus or vertigo Allergy and Immunology ROS: negative for - hives or itchy/watery eyes Hematological and Lymphatic ROS: negative for - bleeding problems, bruising or swollen lymph nodes Endocrine ROS: negative for - galactorrhea, hair pattern changes, polydipsia/polyuria or temperature intolerance Respiratory ROS: negative for - cough, hemoptysis, shortness of breath or wheezing Cardiovascular ROS: Chest pain with exertion as well as lower extremity claudication with walking Gastrointestinal ROS: negative for - abdominal pain, diarrhea, hematemesis, nausea/vomiting or stool incontinence Genito-Urinary ROS: negative for - dysuria, hematuria, incontinence or urinary frequency/urgency Musculoskeletal ROS: Limited motion right shoulder since previous injury and surgery Neurological ROS: as noted in HPI Dermatological ROS: negative for rash and skin lesion changes  Physical Examination: Blood pressure 144/56, pulse  80, temperature 98.4 F (36.9 C), resp. rate 20, SpO2 100 %.  HEENT-  Normocephalic, no lesions, without obvious abnormality.  Normal external eye and conjunctiva.  Normal TM's bilaterally.  Normal auditory canals and external ears. Normal external  nose, mucus membranes and septum.  Normal pharynx. Neck supple with no masses, nodes, nodules or enlargement. Cardiovascular - regular rate and rhythm, S1, S2 normal, no murmur, click, rub or gallop Lungs - chest clear, no wheezing, rales, normal symmetric air entry Abdomen - soft, non-tender; bowel sounds normal; no masses,  no organomegaly Extremities - no joint deformities, effusion, or inflammation, no edema and no skin discoloration Skin - no peripheral edema; no skin discoloration noted  Neurologic Examination: Mental Status: Alert, oriented, thought content appropriate.  Speech fluent without evidence of aphasia. Able to follow commands without difficulty. Cranial Nerves: II-Visual fields were normal. III/IV/VI-Pupils were equal and reacted. Extraocular movements were full and conjugate.    V/VII-mild left facial weakness compared to right; no facial weakness. VIII-normal. X-normal speech. XI: trapezius strength/neck flexion strength normal bilaterally XII-midline tongue extension with normal strength. Motor: 5/5 bilaterally with normal tone and bulk, except for mild proximal lower extremity weakness. Sensory: Reduced perception tactile sensation compared to right; normal sensation of lower extremities Deep Tendon Reflexes: 2+ and symmetric. Plantars: Mute bilaterally Cerebellar: Normal finger-to-nose testing.  Results for orders placed or performed during the hospital encounter of 09/23/14 (from the past 48 hour(s))  CBC     Status: Abnormal   Collection Time: 09/23/14  9:50 AM  Result Value Ref Range   WBC 7.9 4.0 - 10.5 K/uL   RBC 3.88 (L) 4.22 - 5.81 MIL/uL   Hemoglobin 7.0 (L) 13.0 - 17.0 g/dL   HCT 25.6 (L) 39.0 - 52.0 %   MCV 66.0 (L) 78.0 - 100.0 fL   MCH 18.0 (L) 26.0 - 34.0 pg   MCHC 27.3 (L) 30.0 - 36.0 g/dL   RDW 18.1 (H) 11.5 - 15.5 %   Platelets 261 150 - 400 K/uL  Differential     Status: None (Preliminary result)   Collection Time: 09/23/14  9:50 AM   Result Value Ref Range   Neutrophils Relative % PENDING 43 - 77 %   Neutro Abs PENDING 1.7 - 7.7 K/uL   Band Neutrophils PENDING 0 - 10 %   Lymphocytes Relative PENDING 12 - 46 %   Lymphs Abs PENDING 0.7 - 4.0 K/uL   Monocytes Relative PENDING 3 - 12 %   Monocytes Absolute PENDING 0.1 - 1.0 K/uL   Eosinophils Relative PENDING 0 - 5 %   Eosinophils Absolute PENDING 0.0 - 0.7 K/uL   Basophils Relative PENDING 0 - 1 %   Basophils Absolute PENDING 0.0 - 0.1 K/uL   WBC Morphology PENDING    RBC Morphology PENDING    Smear Review PENDING    nRBC PENDING 0 /100 WBC   Metamyelocytes Relative PENDING %   Myelocytes PENDING %   Promyelocytes Absolute PENDING %   Blasts PENDING %  I-Stat Troponin, ED (not at [facility])     Status: None   Collection Time: 09/23/14  9:54 AM  Result Value Ref Range   Troponin i, poc 0.01 0.00 - 0.08 ng/mL   Comment 3            Comment: Due to the release kinetics of cTnI, a negative result within the first hours of the onset of symptoms does not rule out myocardial infarction with certainty. If myocardial infarction  is still suspected, repeat the test at appropriate intervals.   I-Stat Chem 8, ED     Status: Abnormal   Collection Time: 09/23/14  9:56 AM  Result Value Ref Range   Sodium 133 (L) 135 - 145 mmol/L   Potassium 4.1 3.5 - 5.1 mmol/L   Chloride 94 (L) 96 - 112 mmol/L   BUN 17 6 - 23 mg/dL   Creatinine, Ser 1.00 0.50 - 1.35 mg/dL   Glucose, Bld 349 (H) 70 - 99 mg/dL   Calcium, Ion 1.07 (L) 1.12 - 1.23 mmol/L   TCO2 23 0 - 100 mmol/L   Hemoglobin 9.2 (L) 13.0 - 17.0 g/dL   HCT 27.0 (L) 39.0 - 52.0 %   Ct Head Wo Contrast  09/23/2014   CLINICAL DATA:  Headache, dizziness, weakness. Visual change in the left eye with left-sided headache. Code stroke.  EXAM: CT HEAD WITHOUT CONTRAST  TECHNIQUE: Contiguous axial images were obtained from the base of the skull through the vertex without intravenous contrast.  COMPARISON:  02/28/2014  FINDINGS: Skull  and Sinuses:Negative for fracture or destructive process. The mastoids, middle ears, and imaged paranasal sinuses are clear.  Orbits: No acute abnormality.  Brain: No evidence of acute infarction, hemorrhage, hydrocephalus, or mass lesion/mass effect. Remote lacunar infarct in the right thalamus and small remote ischemic focus in the left corona radiata. Low-attenuation along the lower right sylvian fissure is most consistent with a dilated perivascular space.  Since this is a code stroke, page to Dr. Nicole Kindred placed on 09/23/2014 at 10:04 am.  IMPRESSION: 1. No acute intracranial findings. 2. Chronic small vessel disease with remote right thalamus lacunar infarct.   Electronically Signed   By: Monte Fantasia M.D.   On: 09/23/2014 10:06    Assessment/Plan 58 year old man with multiple risk factors for stroke as well as history of previous stroke, presenting with probable recurrent right MCA territory subcortical cerebral infarction.  Plan: 1. HgbA1c, fasting lipid panel 2. MRI, MRA  of the brain without contrast 3. PT consult, OT consult, Speech consult 4. Echocardiogram 5. Carotid dopplers 6. Prophylactic therapy-Antiplatelet med: Aspirin  7. Risk  Factor modification   C.R. Nicole Kindred, MD [facility] [phone]  09/23/2014, 10:29 AM

## 2014-09-23 NOTE — Progress Notes (Addendum)
*  PRELIMINARY RESULTS* Vascular Ultrasound Carotid Duplex (Doppler) has been completed.  Preliminary findings: Right = 60-79% ICA stenosis. Left = 80-99% ICA stenosis. Stenosis has worsened bilaterally since last study 02/2014 where previously the right was 1-39% range and the left was 60-79% range.   Landry Mellow, RDMS, RVT  09/23/2014, 3:47 PM

## 2014-09-23 NOTE — ED Notes (Signed)
Dr. Alvino Chapel to bedside to collect POC occcult blood sample. Pt. Refused.

## 2014-09-23 NOTE — Progress Notes (Signed)
Follow-up note.  MRI did not reveal evidence of acute CVA. GI feels that anemia likely represents NSAID induced ulcer. Plan for EGD in a.m., meanwhile will hold aspirin therapy.

## 2014-09-23 NOTE — ED Notes (Signed)
Per pt sts that about 1 1/12 hour ago sudden onset of headache, dizziness, left eye blurry, numbness. Hx of stroke. Code stroke called

## 2014-09-23 NOTE — Code Documentation (Signed)
59yo male arriving to [facility] via private vehicle at [place].  Patient reports sudden onset left sided headache, left visual disturbance and dizziness at 0800.  Patient has a h/o stroke within the past year per patient with similar symptoms.  Code stroke activated in triage.  Patient to CT.  Stroke team to the bedside.  Patient back to room.  NIHSS 1, see documentation for details and code stroke times.  Patient's wife at bedside who reports that patient had incomprehensible speech which has now improved.  Patient reporting that visual disturbance has resolved, but continues to have decreased sensation to the left face and arm.  Dr. Nicole Kindred at the bedside.  No acute stroke treatment at this time due to mild, resolving symptoms, however, patient remains in the window to treat with tPA should symptoms worsen until 1100.  Bedside handoff with ED RN Martie Round.

## 2014-09-23 NOTE — Progress Notes (Addendum)
Pt arrived to 4N04 at current time.  Pt A&O x 4, no complaints of pain.  Pt V/S taken, pt will be Q2 neuro and V/S checks until 0100.  Pt without distress.  Family at the bedside. Diet ordered, will monitor. MRI called to pick up pt for brain MRI/MRA, MD paged, MD verbal order to send pt for MRI and transfuse blood upon return.  Will transfuse upon return to the floor.

## 2014-09-23 NOTE — Consult Note (Signed)
[facility] CONSULT Reason for consult: marked anemia Referring Physician: [facility] PCP: [name]. Primary G.I.: none patient unassigned  Brad [name] is an 59 y.o. male.  HPI: he has a history of carotid artery stenosis and previous small lacuna infarcts. He has type II diabetes and hypertension as well as along smoking history area he presented to the emergency room with neurological symptoms however CT and MRI did not show any acute events and showed only old infarcts. He was found to be profoundly anemic with a hemoglobin of 7. He denies any hematemesis, epigastric discomfort or melena. He denies hematochezia. He does admit to taking a lot of BC powders for chronic headaches which she is attributed to his hypertension and apparently had melenic stools in the past. He is not on any acid reducing medications and is on chronic antiplatelet agents as well as ibuprofen with frequent BC useas well. He has never had EGD or colonoscopy  Past Medical History  Diagnosis Date  . Depression   . GERD (gastroesophageal reflux disease)   . Diabetes mellitus type II   . COPD (chronic obstructive pulmonary disease)   . Ventral hernia   . Chronic LBP   . Hyperlipidemia 08/27/2014  . Hypothyroidism 08/27/2014  . Carotid stenosis 08/27/2014    Past Surgical History  Procedure Laterality Date  . Neg stress test  2000  . Rotator cuff repair  2009    multiple f/u Sxs/infection Tx  . Incision and drainage perirectal abscess  03/2009    Family History  Problem Relation Age of Onset  . Alcohol abuse Father   . Lung cancer Father   . Heart disease Mother     automated implantable cardioverter-defibrillator   . Diabetes type II Mother   . Lung cancer Maternal Grandmother   . Lung cancer Sister     Social History:  reports that he has been smoking Cigarettes.  He has a 60 pack-year smoking history. He has never used smokeless tobacco. He reports that he drinks alcohol. He reports  that he does not use illicit drugs.  Allergies:  Allergies  Allergen Reactions  . Doxycycline Hives and Itching  . Lipitor [Atorvastatin] Other (See Comments)    Myalgia   . Oxycodone Itching    Medications; Prior to Admission medications   Medication Sig Start Date End Date Taking? Authorizing Provider  albuterol (PROVENTIL HFA;VENTOLIN HFA) 108 (90 BASE) MCG/ACT inhaler Inhale 2 puffs into the lungs every 6 (six) hours as needed for wheezing or shortness of breath. 10/10/13  Yes Biagio Borg, MD  aspirin EC 81 MG tablet Take 1 tablet (81 mg total) by mouth daily. 03/01/14  Yes Reyne Dumas, MD  Blood Pressure Monitor DEVI Use to check Blood pressure daily 05/25/14  Yes Biagio Borg, MD  cilostazol (PLETAL) 100 MG tablet TAKE 1 TABLET BY MOUTH TWICE DAILY 05/25/14  Yes Serafina Mitchell, MD  furosemide (LASIX) 40 MG tablet 1 tab by mouth in the AM, with a second dose in PM as needed only for swelling 08/27/14  Yes Biagio Borg, MD  glipiZIDE (GLIPIZIDE XL) 2.5 MG 24 hr tablet Take 1 tablet (2.5 mg total) by mouth daily with breakfast. As needed for blood sugar > 200 03/19/14  Yes Biagio Borg, MD  glucosamine-chondroitin 500-400 MG tablet Take 2 tablets by mouth daily.   Yes Historical Provider, MD  glucose blood test strip 1 each by Other route See admin instructions. Check blood sugar once daily.  Yes Historical Provider, MD  HYDROcodone-homatropine (HYCODAN) 5-1.5 MG/5ML syrup Take 5 mLs by mouth every 6 (six) hours as needed. Patient not taking: Reported on 09/23/2014 06/18/14   Biagio Borg, MD  ibuprofen (ADVIL,MOTRIN) 200 MG tablet Take 800 mg by mouth every 6 (six) hours as needed for mild pain or moderate pain.   Yes Historical Provider, MD  Lancets MISC 1 each by Other route See admin instructions. Check blood sugar once daily.   Yes Historical Provider, MD  levofloxacin (LEVAQUIN) 250 MG tablet Take 1 tablet (250 mg total) by mouth daily. Patient not taking: Reported on 09/23/2014  06/18/14   Biagio Borg, MD  levothyroxine (SYNTHROID, LEVOTHROID) 88 MCG tablet Take 1 tablet (88 mcg total) by mouth daily. 08/27/14  Yes Biagio Borg, MD  lisinopril (PRINIVIL,ZESTRIL) 20 MG tablet Take 1 tablet (20 mg total) by mouth daily. 05/25/14  Yes Rosalin Hawking, MD  metFORMIN (GLUCOPHAGE) 500 MG tablet Take 1,000 mg by mouth daily.   Yes Historical Provider, MD  nortriptyline (PAMELOR) 10 MG capsule Take 1 capsule (10 mg total) by mouth at bedtime. Patient not taking: Reported on 09/23/2014 10/10/13   Biagio Borg, MD  simvastatin (ZOCOR) 40 MG tablet Take 1 tablet (40 mg total) by mouth at bedtime. 08/27/14  Yes Biagio Borg, MD  tiotropium (SPIRIVA) 18 MCG inhalation capsule Place 18 mcg into inhaler and inhale 2 (two) times daily as needed (for wheezing).   Yes Historical Provider, MD  tiZANidine (ZANAFLEX) 4 MG tablet Take 8 mg by mouth every 6 (six) hours as needed for muscle spasms.   Yes Historical Provider, MD  traMADol (ULTRAM) 50 MG tablet 1-2 tabs by mouth up to four times per day 08/27/14  Yes Biagio Borg, MD   .  stroke: mapping our early stages of recovery book   Does not apply Once  . sodium chloride   Intravenous Once  . aspirin  300 mg Rectal Daily   Or  . aspirin  325 mg Oral Daily  . cilostazol  100 mg Oral BID  . insulin aspart  0-15 Units Subcutaneous TID WC  . insulin aspart  0-5 Units Subcutaneous QHS  . insulin glargine  12 Units Subcutaneous QHS  . [START ON 09/24/2014] levothyroxine  88 mcg Oral QAC breakfast  . nicotine  21 mg Transdermal Daily  . nortriptyline  10 mg Oral QHS  . pantoprazole (PROTONIX) IV  40 mg Intravenous Q12H  . [START ON 09/24/2014] pneumococcal 23 valent vaccine  0.5 mL Intramuscular Tomorrow-1000  . simvastatin  40 mg Oral QHS   PRN Meds acetaminophen **OR** acetaminophen, HYDROcodone-acetaminophen, labetalol, senna-docusate, tiZANidine Results for orders placed or performed during the hospital encounter of 09/23/14 (from the past 48  hour(s))  Ethanol     Status: None   Collection Time: 09/23/14  9:50 AM  Result Value Ref Range   Alcohol, Ethyl (B) <5 0 - 9 mg/dL    Comment:        LOWEST DETECTABLE LIMIT FOR SERUM ALCOHOL IS 11 mg/dL FOR MEDICAL PURPOSES ONLY   Protime-INR     Status: None   Collection Time: 09/23/14  9:50 AM  Result Value Ref Range   Prothrombin Time 13.9 11.6 - 15.2 seconds   INR 1.05 0.00 - 1.49  APTT     Status: None   Collection Time: 09/23/14  9:50 AM  Result Value Ref Range   aPTT 30 24 - 37 seconds  CBC  Status: Abnormal   Collection Time: 09/23/14  9:50 AM  Result Value Ref Range   WBC 7.9 4.0 - 10.5 K/uL   RBC 3.88 (L) 4.22 - 5.81 MIL/uL   Hemoglobin 7.0 (L) 13.0 - 17.0 g/dL   HCT 25.6 (L) 39.0 - 52.0 %   MCV 66.0 (L) 78.0 - 100.0 fL   MCH 18.0 (L) 26.0 - 34.0 pg   MCHC 27.3 (L) 30.0 - 36.0 g/dL   RDW 18.1 (H) 11.5 - 15.5 %   Platelets 261 150 - 400 K/uL  Differential     Status: None   Collection Time: 09/23/14  9:50 AM  Result Value Ref Range   Neutrophils Relative % 68 43 - 77 %   Lymphocytes Relative 18 12 - 46 %   Monocytes Relative 10 3 - 12 %   Eosinophils Relative 3 0 - 5 %   Basophils Relative 1 0 - 1 %   Neutro Abs 5.4 1.7 - 7.7 K/uL   Lymphs Abs 1.4 0.7 - 4.0 K/uL   Monocytes Absolute 0.8 0.1 - 1.0 K/uL   Eosinophils Absolute 0.2 0.0 - 0.7 K/uL   Basophils Absolute 0.1 0.0 - 0.1 K/uL   RBC Morphology ELLIPTOCYTES     Comment: POLYCHROMASIA PRESENT  Comprehensive metabolic panel     Status: Abnormal   Collection Time: 09/23/14  9:50 AM  Result Value Ref Range   Sodium 132 (L) 135 - 145 mmol/L   Potassium 3.9 3.5 - 5.1 mmol/L   Chloride 96 96 - 112 mmol/L   CO2 29 19 - 32 mmol/L   Glucose, Bld 342 (H) 70 - 99 mg/dL   BUN 15 6 - 23 mg/dL   Creatinine, Ser 0.99 0.50 - 1.35 mg/dL   Calcium 8.3 (L) 8.4 - 10.5 mg/dL   Total Protein 6.2 6.0 - 8.3 g/dL   Albumin 2.5 (L) 3.5 - 5.2 g/dL   AST 38 (H) 0 - 37 U/L   ALT 30 0 - 53 U/L   Alkaline Phosphatase  138 (H) 39 - 117 U/L   Total Bilirubin 0.5 0.3 - 1.2 mg/dL   GFR calc non Af Amer 90 (L) >90 mL/min   GFR calc Af Amer >90 >90 mL/min    Comment: (NOTE) The eGFR has been calculated using the CKD EPI equation. This calculation has not been validated in all clinical situations. eGFR's persistently <90 mL/min signify possible Chronic Kidney Disease.    Anion gap 7 5 - 15  I-Stat Troponin, ED (not at [facility])     Status: None   Collection Time: 09/23/14  9:54 AM  Result Value Ref Range   Troponin i, poc 0.01 0.00 - 0.08 ng/mL   Comment 3            Comment: Due to the release kinetics of cTnI, a negative result within the first hours of the onset of symptoms does not rule out myocardial infarction with certainty. If myocardial infarction is still suspected, repeat the test at appropriate intervals.   I-Stat Chem 8, ED     Status: Abnormal   Collection Time: 09/23/14  9:56 AM  Result Value Ref Range   Sodium 133 (L) 135 - 145 mmol/L   Potassium 4.1 3.5 - 5.1 mmol/L   Chloride 94 (L) 96 - 112 mmol/L   BUN 17 6 - 23 mg/dL   Creatinine, Ser 1.00 0.50 - 1.35 mg/dL   Glucose, Bld 349 (H) 70 - 99 mg/dL   Calcium,  Ion 1.07 (L) 1.12 - 1.23 mmol/L   TCO2 23 0 - 100 mmol/L   Hemoglobin 9.2 (L) 13.0 - 17.0 g/dL   HCT 27.0 (L) 39.0 - 52.0 %  Urine Drug Screen     Status: None   Collection Time: 09/23/14 10:18 AM  Result Value Ref Range   Opiates NONE DETECTED NONE DETECTED   Cocaine NONE DETECTED NONE DETECTED   Benzodiazepines NONE DETECTED NONE DETECTED   Amphetamines NONE DETECTED NONE DETECTED   Tetrahydrocannabinol NONE DETECTED NONE DETECTED   Barbiturates NONE DETECTED NONE DETECTED    Comment:        DRUG SCREEN FOR MEDICAL PURPOSES ONLY.  IF CONFIRMATION IS NEEDED FOR ANY PURPOSE, NOTIFY LAB WITHIN 5 DAYS.        LOWEST DETECTABLE LIMITS FOR URINE DRUG SCREEN Drug Class       Cutoff (ng/mL) Amphetamine      1000 Barbiturate      200 Benzodiazepine   182 Tricyclics        993 Opiates          300 Cocaine          300 THC              50   Urinalysis, Routine w reflex microscopic     Status: Abnormal   Collection Time: 09/23/14 10:18 AM  Result Value Ref Range   Color, Urine YELLOW YELLOW   APPearance CLEAR CLEAR   Specific Gravity, Urine 1.022 1.005 - 1.030   pH 5.5 5.0 - 8.0   Glucose, UA 500 (A) NEGATIVE mg/dL   Hgb urine dipstick NEGATIVE NEGATIVE   Bilirubin Urine NEGATIVE NEGATIVE   Ketones, ur NEGATIVE NEGATIVE mg/dL   Protein, ur >300 (A) NEGATIVE mg/dL   Urobilinogen, UA 0.2 0.0 - 1.0 mg/dL   Nitrite NEGATIVE NEGATIVE   Leukocytes, UA NEGATIVE NEGATIVE  Urine microscopic-add on     Status: Abnormal   Collection Time: 09/23/14 10:18 AM  Result Value Ref Range   Squamous Epithelial / LPF FEW (A) RARE   WBC, UA 3-6 <3 WBC/hpf   RBC / HPF 0-2 <3 RBC/hpf   Bacteria, UA FEW (A) RARE   Casts GRANULAR CAST (A) NEGATIVE    Comment: HYALINE CASTS  CBC     Status: Abnormal   Collection Time: 09/23/14 11:15 AM  Result Value Ref Range   WBC 6.5 4.0 - 10.5 K/uL   RBC 3.57 (L) 4.22 - 5.81 MIL/uL   Hemoglobin 6.6 (LL) 13.0 - 17.0 g/dL    Comment: REPEATED TO VERIFY CRITICAL RESULT CALLED TO, READ BACK BY AND VERIFIED WITH: Patrecia Pace RN 1215 09/23/2014 BY MACEDA, J    HCT 23.5 (L) 39.0 - 52.0 %   MCV 65.8 (L) 78.0 - 100.0 fL   MCH 18.5 (L) 26.0 - 34.0 pg   MCHC 28.1 (L) 30.0 - 36.0 g/dL   RDW 18.0 (H) 11.5 - 15.5 %   Platelets 223 150 - 400 K/uL  Sample to Blood Bank     Status: None   Collection Time: 09/23/14 11:15 AM  Result Value Ref Range   Blood Bank Specimen SAMPLE AVAILABLE FOR TESTING    Sample Expiration 09/24/2014   Type and screen     Status: None (Preliminary result)   Collection Time: 09/23/14 11:15 AM  Result Value Ref Range   ABO/RH(D) B POS    Antibody Screen NEG    Sample Expiration 09/26/2014    Unit Number [id number]  Blood Component Type RED CELLS,LR    Unit division 00    Status of Unit ALLOCATED     Transfusion Status OK TO TRANSFUSE    Crossmatch Result Compatible    Unit Number [id number]    Blood Component Type RED CELLS,LR    Unit division 00    Status of Unit ISSUED    Transfusion Status OK TO TRANSFUSE    Crossmatch Result Compatible   Prepare RBC     Status: None   Collection Time: 09/23/14 11:15 AM  Result Value Ref Range   Order Confirmation ORDER PROCESSED BY BLOOD BANK     Ct Head Wo Contrast  09/23/2014   CLINICAL DATA:  Headache, dizziness, weakness. Visual change in the left eye with left-sided headache. Code stroke.  EXAM: CT HEAD WITHOUT CONTRAST  TECHNIQUE: Contiguous axial images were obtained from the base of the skull through the vertex without intravenous contrast.  COMPARISON:  02/28/2014  FINDINGS: Skull and Sinuses:Negative for fracture or destructive process. The mastoids, middle ears, and imaged paranasal sinuses are clear.  Orbits: No acute abnormality.  Brain: No evidence of acute infarction, hemorrhage, hydrocephalus, or mass lesion/mass effect. Remote lacunar infarct in the right thalamus and small remote ischemic focus in the left corona radiata. Low-attenuation along the lower right sylvian fissure is most consistent with a dilated perivascular space.  Since this is a code stroke, page to Dr. Nicole Kindred placed on 09/23/2014 at 10:04 am.  IMPRESSION: 1. No acute intracranial findings. 2. Chronic small vessel disease with remote right thalamus lacunar infarct.   Electronically Signed   By: Monte Fantasia M.D.   On: 09/23/2014 10:06   Mr Brain Wo Contrast  09/23/2014   CLINICAL DATA:  59 year old male with slurred speech and left facial numbness. Person history of previous lacunar infarcts. Initial encounter.  EXAM: MRI HEAD WITHOUT CONTRAST  MRA HEAD WITHOUT CONTRAST  TECHNIQUE: Multiplanar, multiecho pulse sequences of the brain and surrounding structures were obtained without intravenous contrast. Angiographic images of the head were obtained using MRA technique  without contrast.  COMPARISON:  Head CT without contrast 0957 hr today. Brain MRI and MRA 02/28/2014.  FINDINGS: MRI HEAD FINDINGS  Stable cerebral volume since 2015. Major intracranial vascular flow voids are stable. No restricted diffusion to suggest acute infarction. No midline shift, mass effect, evidence of mass lesion, ventriculomegaly, extra-axial collection or acute intracranial hemorrhage. Cervicomedullary junction and pituitary are within normal limits. Negative visualized cervical spine. Stable gray and white matter signal throughout the brain.  Visible internal auditory structures appear normal. Stable mastoids and paranasal sinuses. Stable orbits soft tissues. Visualized scalp soft tissues are within normal limits. Stable bone marrow signal.  MRA HEAD FINDINGS  Chronically decreased antegrade flow signal in the distal right vertebral artery which appears atherosclerotic and stenotic. The right PICA remains patent. Stable antegrade flow signal in the distal left vertebral artery. Left PICA is patent. Stable basilar artery without stenosis. SCA and PCA origins are stable and within normal limits. Bilateral PCA branches are stable with mild left P2 segment stenosis, preserved distal PCA flow. Stable posterior communicating arteries.  Stable antegrade flow in both ICA siphons with moderate left greater than right siphon irregularity corresponding to calcified atherosclerotic plaque on the earlier CT. Up to mild siphon stenosis on the left. Ophthalmic artery origins are within normal limits. Carotid termini remain patent.  Dominant right ACA A1 segment again noted. Anterior communicating artery and visualized bilateral ACA branches are stable and within normal limits.  Visualized left MCA branches are stable and within normal limits.  IMPRESSION: 1. No acute intracranial abnormality. Stable non contrast MRI appearance of the brain since 2015. 2. Stable intracranial MRA; chronically diseased and stenotic  distal right vertebral artery, and left greater than right carotid siphon atherosclerosis.   Electronically Signed   By: Genevie Ann M.D.   On: 09/23/2014 15:29   Mr Jodene Nam Head/brain Wo Cm  09/23/2014   CLINICAL DATA:  59 year old male with slurred speech and left facial numbness. Person history of previous lacunar infarcts. Initial encounter.  EXAM: MRI HEAD WITHOUT CONTRAST  MRA HEAD WITHOUT CONTRAST  TECHNIQUE: Multiplanar, multiecho pulse sequences of the brain and surrounding structures were obtained without intravenous contrast. Angiographic images of the head were obtained using MRA technique without contrast.  COMPARISON:  Head CT without contrast 0957 hr today. Brain MRI and MRA 02/28/2014.  FINDINGS: MRI HEAD FINDINGS  Stable cerebral volume since 2015. Major intracranial vascular flow voids are stable. No restricted diffusion to suggest acute infarction. No midline shift, mass effect, evidence of mass lesion, ventriculomegaly, extra-axial collection or acute intracranial hemorrhage. Cervicomedullary junction and pituitary are within normal limits. Negative visualized cervical spine. Stable gray and white matter signal throughout the brain.  Visible internal auditory structures appear normal. Stable mastoids and paranasal sinuses. Stable orbits soft tissues. Visualized scalp soft tissues are within normal limits. Stable bone marrow signal.  MRA HEAD FINDINGS  Chronically decreased antegrade flow signal in the distal right vertebral artery which appears atherosclerotic and stenotic. The right PICA remains patent. Stable antegrade flow signal in the distal left vertebral artery. Left PICA is patent. Stable basilar artery without stenosis. SCA and PCA origins are stable and within normal limits. Bilateral PCA branches are stable with mild left P2 segment stenosis, preserved distal PCA flow. Stable posterior communicating arteries.  Stable antegrade flow in both ICA siphons with moderate left greater than right  siphon irregularity corresponding to calcified atherosclerotic plaque on the earlier CT. Up to mild siphon stenosis on the left. Ophthalmic artery origins are within normal limits. Carotid termini remain patent.  Dominant right ACA A1 segment again noted. Anterior communicating artery and visualized bilateral ACA branches are stable and within normal limits. Visualized left MCA branches are stable and within normal limits.  IMPRESSION: 1. No acute intracranial abnormality. Stable non contrast MRI appearance of the brain since 2015. 2. Stable intracranial MRA; chronically diseased and stenotic distal right vertebral artery, and left greater than right carotid siphon atherosclerosis.   Electronically Signed   By: Genevie Ann M.D.   On: 09/23/2014 15:29               Blood pressure 159/78, pulse 91, temperature 97.5 F (36.4 C), temperature source Oral, resp. rate 20, height _0  (1.803 m), weight 122.471 kg (270 lb), SpO2 94 %.  Physical exam:   General--obese white male alert and oriented in no distress. No obvious neurological deficits  ENT--mucous membranes normal sclera nonicteric  Neck--obese neck no lymphadenopathy  Heart--regular rate and rhythm without murmurs are gallops  Lungs--clear  Abdomen--obese soft and nontender     Assessment: 1. Marked anemia. Patient is not currently taking any acid reducing medications and has been taking ibuprofen as well as a large amount of BC powders in the face of antiplatelet agents. It's very likely he has NSAID induced ulcer. 2. Cerebrovascular disease with history of previous strokes. CT and MRI negative for acute stroke. Neurological symptoms have resolved. 3. Type II diabetes  4. Hypertension 5. Obesity  Plan: will plan on EGD tomorrow. Scheduled about 12 noon. We'll keep him NPO after it midnight. Would go ahead and keep them on PPI therapy.   Yalissa Fink JR,Gilman Olazabal L 09/23/2014, 4:21 PM

## 2014-09-23 NOTE — ED Provider Notes (Signed)
CSN: [id number]     Arrival date & time 09/23/14  0932 History   First MD Initiated Contact with Patient 09/23/14 [phone]     Chief Complaint  Patient presents with  . Headache  . Dizziness  . Numbness  . Weakness     (Consider location/radiation/quality/duration/timing/severity/associated sxs/prior Treatment) Patient is a 59 y.o. male presenting with headaches, dizziness, and weakness. The history is provided by the patient.  Headache Associated symptoms: dizziness and weakness   Associated symptoms: no fatigue   Dizziness Associated symptoms: headaches and weakness   Weakness Associated symptoms include headaches.   patient presents with headache visual changes and dizziness. Began at around 8 AM today. Previous history of a stroke and states that he had dizziness at that time. States that he began to have difficulty speaking and slurred speech. States the left side of his face appeared to be involved for last time it was the right side. He has a headache that goes from behind his left eye to the back of his head. He states he had a stroke around 8 or 9 months ago. No fevers. No confusion. No chest pain. No Abdominal pain. Code stroke was called after seeing the patient. States that he has had some lightening and spots in the vision on his left eye.  Past Medical History  Diagnosis Date  . Depression   . GERD (gastroesophageal reflux disease)   . Diabetes mellitus type II   . COPD (chronic obstructive pulmonary disease)   . Ventral hernia   . Chronic LBP   . Hyperlipidemia 08/27/2014  . Hypothyroidism 08/27/2014  . Carotid stenosis 08/27/2014   Past Surgical History  Procedure Laterality Date  . Neg stress test  2000  . Rotator cuff repair  2009    multiple f/u Sxs/infection Tx  . Incision and drainage perirectal abscess  03/2009   Family History  Problem Relation Age of Onset  . Alcohol abuse Father   . Lung cancer Father   . Heart disease Mother     automated implantable  cardioverter-defibrillator   . Diabetes type II Mother   . Lung cancer Maternal Grandmother   . Lung cancer Sister    History  Substance Use Topics  . Smoking status: Current Every Day Smoker -- 1.50 packs/day for 40 years    Types: Cigarettes  . Smokeless tobacco: Never Used     Comment: 1-2 pks per day  . Alcohol Use: Yes     Comment: Social    Review of Systems  Constitutional: Negative for chills and fatigue.  Eyes: Positive for visual disturbance.  Neurological: Positive for dizziness, weakness and headaches.      Allergies  Doxycycline; Lipitor; and Oxycodone  Home Medications   Prior to Admission medications   Medication Sig Start Date End Date Taking? Authorizing Provider  albuterol (PROVENTIL HFA;VENTOLIN HFA) 108 (90 BASE) MCG/ACT inhaler Inhale 2 puffs into the lungs every 6 (six) hours as needed for wheezing or shortness of breath. 10/10/13  Yes Biagio Borg, MD  aspirin EC 81 MG tablet Take 1 tablet (81 mg total) by mouth daily. 03/01/14  Yes Reyne Dumas, MD  Blood Pressure Monitor DEVI Use to check Blood pressure daily 05/25/14  Yes Biagio Borg, MD  cilostazol (PLETAL) 100 MG tablet TAKE 1 TABLET BY MOUTH TWICE DAILY 05/25/14  Yes Serafina Mitchell, MD  furosemide (LASIX) 40 MG tablet 1 tab by mouth in the AM, with a second dose in PM as needed  only for swelling 08/27/14  Yes Biagio Borg, MD  glipiZIDE (GLIPIZIDE XL) 2.5 MG 24 hr tablet Take 1 tablet (2.5 mg total) by mouth daily with breakfast. As needed for blood sugar > 200 03/19/14  Yes Biagio Borg, MD  glucosamine-chondroitin 500-400 MG tablet Take 2 tablets by mouth daily.   Yes Historical Provider, MD  glucose blood test strip 1 each by Other route See admin instructions. Check blood sugar once daily.   Yes Historical Provider, MD  HYDROcodone-homatropine (HYCODAN) 5-1.5 MG/5ML syrup Take 5 mLs by mouth every 6 (six) hours as needed. Patient not taking: Reported on 09/23/2014 06/18/14   Biagio Borg, MD   ibuprofen (ADVIL,MOTRIN) 200 MG tablet Take 800 mg by mouth every 6 (six) hours as needed for mild pain or moderate pain.   Yes Historical Provider, MD  Lancets MISC 1 each by Other route See admin instructions. Check blood sugar once daily.   Yes Historical Provider, MD  levofloxacin (LEVAQUIN) 250 MG tablet Take 1 tablet (250 mg total) by mouth daily. Patient not taking: Reported on 09/23/2014 06/18/14   Biagio Borg, MD  levothyroxine (SYNTHROID, LEVOTHROID) 88 MCG tablet Take 1 tablet (88 mcg total) by mouth daily. 08/27/14  Yes Biagio Borg, MD  lisinopril (PRINIVIL,ZESTRIL) 20 MG tablet Take 1 tablet (20 mg total) by mouth daily. 05/25/14  Yes Rosalin Hawking, MD  metFORMIN (GLUCOPHAGE) 500 MG tablet Take 1,000 mg by mouth daily.   Yes Historical Provider, MD  nortriptyline (PAMELOR) 10 MG capsule Take 1 capsule (10 mg total) by mouth at bedtime. Patient not taking: Reported on 09/23/2014 10/10/13   Biagio Borg, MD  simvastatin (ZOCOR) 40 MG tablet Take 1 tablet (40 mg total) by mouth at bedtime. 08/27/14  Yes Biagio Borg, MD  tiotropium (SPIRIVA) 18 MCG inhalation capsule Place 18 mcg into inhaler and inhale 2 (two) times daily as needed (for wheezing).   Yes Historical Provider, MD  tiZANidine (ZANAFLEX) 4 MG tablet Take 8 mg by mouth every 6 (six) hours as needed for muscle spasms.   Yes Historical Provider, MD  traMADol (ULTRAM) 50 MG tablet 1-2 tabs by mouth up to four times per day 08/27/14  Yes Biagio Borg, MD   BP 122/68 mmHg  Pulse 70  Temp(Src) 98.6 F (37 C) (Oral)  Resp 18  Ht 5\' 11"  (1.803 m)  Wt 270 lb (122.471 kg)  BMI 37.67 kg/m2  SpO2 98% Physical Exam  Constitutional: He is oriented to person, place, and time. He appears well-developed and well-nourished.  HENT:  Head: Normocephalic and atraumatic.  Eyes: EOM are normal. Pupils are equal, round, and reactive to light.  Neck: Neck supple.  Cardiovascular: Normal rate and regular rhythm.   Pulmonary/Chest: Effort normal.   Abdominal: There is no tenderness.  Musculoskeletal:  Treatment history range of motion right shoulder. Previous fracture.  Neurological: He is alert and oriented to person, place, and time.  Subjectively decreased sensation over left forehead. Good sensation otherwise in the face. Equal smile. Extraocular movements intact. Vision grossly intact over both eyes. Good grip strength bilaterally. Finger-nose intact on left. Good strength in both legs. Able to stand without difficulty.  Skin: Skin is warm.  Psychiatric: He has a normal mood and affect.    ED Course  Procedures (including critical care time) Labs Review Labs Reviewed  CBC - Abnormal; Notable for the following:    RBC 3.88 (*)    Hemoglobin 7.0 (*)  HCT 25.6 (*)    MCV 66.0 (*)    MCH 18.0 (*)    MCHC 27.3 (*)    RDW 18.1 (*)    All other components within normal limits  COMPREHENSIVE METABOLIC PANEL - Abnormal; Notable for the following:    Sodium 132 (*)    Glucose, Bld 342 (*)    Calcium 8.3 (*)    Albumin 2.5 (*)    AST 38 (*)    Alkaline Phosphatase 138 (*)    GFR calc non Af Amer 90 (*)    All other components within normal limits  URINALYSIS, ROUTINE W REFLEX MICROSCOPIC - Abnormal; Notable for the following:    Glucose, UA 500 (*)    Protein, ur >300 (*)    All other components within normal limits  URINE MICROSCOPIC-ADD ON - Abnormal; Notable for the following:    Squamous Epithelial / LPF FEW (*)    Bacteria, UA FEW (*)    Casts GRANULAR CAST (*)    All other components within normal limits  CBC - Abnormal; Notable for the following:    RBC 3.57 (*)    Hemoglobin 6.6 (*)    HCT 23.5 (*)    MCV 65.8 (*)    MCH 18.5 (*)    MCHC 28.1 (*)    RDW 18.0 (*)    All other components within normal limits  IRON AND TIBC - Abnormal; Notable for the following:    Iron 23 (*)    TIBC 466 (*)    Saturation Ratios 5 (*)    UIBC 443 (*)    All other components within normal limits  FERRITIN - Abnormal;  Notable for the following:    Ferritin 8 (*)    All other components within normal limits  RETICULOCYTES - Abnormal; Notable for the following:    RBC. 4.15 (*)    All other components within normal limits  LIPID PANEL - Abnormal; Notable for the following:    HDL 28 (*)    All other components within normal limits  BASIC METABOLIC PANEL - Abnormal; Notable for the following:    Glucose, Bld 207 (*)    All other components within normal limits  CBC - Abnormal; Notable for the following:    Hemoglobin 8.5 (*)    HCT 29.3 (*)    MCV 67.0 (*)    MCH 19.5 (*)    MCHC 29.0 (*)    RDW 18.5 (*)    All other components within normal limits  GLUCOSE, CAPILLARY - Abnormal; Notable for the following:    Glucose-Capillary 261 (*)    All other components within normal limits  GLUCOSE, CAPILLARY - Abnormal; Notable for the following:    Glucose-Capillary 214 (*)    All other components within normal limits  I-STAT CHEM 8, ED - Abnormal; Notable for the following:    Sodium 133 (*)    Chloride 94 (*)    Glucose, Bld 349 (*)    Calcium, Ion 1.07 (*)    Hemoglobin 9.2 (*)    HCT 27.0 (*)    All other components within normal limits  ETHANOL  PROTIME-INR  APTT  DIFFERENTIAL  URINE RAPID DRUG SCREEN (HOSP PERFORMED)  VITAMIN B12  FOLATE  HEMOGLOBIN A1C  I-STAT TROPOININ, ED  I-STAT TROPOININ, ED  SAMPLE TO BLOOD BANK  TYPE AND SCREEN  PREPARE RBC (CROSSMATCH)  SURGICAL PATHOLOGY    Imaging Review Ct Head Wo Contrast  09/23/2014   CLINICAL  DATA:  Headache, dizziness, weakness. Visual change in the left eye with left-sided headache. Code stroke.  EXAM: CT HEAD WITHOUT CONTRAST  TECHNIQUE: Contiguous axial images were obtained from the base of the skull through the vertex without intravenous contrast.  COMPARISON:  02/28/2014  FINDINGS: Skull and Sinuses:Negative for fracture or destructive process. The mastoids, middle ears, and imaged paranasal sinuses are clear.  Orbits: No acute  abnormality.  Brain: No evidence of acute infarction, hemorrhage, hydrocephalus, or mass lesion/mass effect. Remote lacunar infarct in the right thalamus and small remote ischemic focus in the left corona radiata. Low-attenuation along the lower right sylvian fissure is most consistent with a dilated perivascular space.  Since this is a code stroke, page to Dr. Nicole Kindred placed on 09/23/2014 at 10:04 am.  IMPRESSION: 1. No acute intracranial findings. 2. Chronic small vessel disease with remote right thalamus lacunar infarct.   Electronically Signed   By: Monte Fantasia M.D.   On: 09/23/2014 10:06   Ct Angio Neck W/cm &/or Wo/cm  09/24/2014   CLINICAL DATA:  Initial evaluation for carotid stenosis.  EXAM: CT ANGIOGRAPHY NECK  TECHNIQUE: Multidetector CT imaging of the neck was performed using the standard protocol during bolus administration of intravenous contrast. Multiplanar CT image reconstructions and MIPs were obtained to evaluate the vascular anatomy. Carotid stenosis measurements (when applicable) are obtained utilizing NASCET criteria, using the distal internal carotid diameter as the denominator.  CONTRAST:  39mL OMNIPAQUE IOHEXOL 350 MG/ML SOLN  COMPARISON:  Prior intracranial MRA from earlier on the same day.  FINDINGS: Aortic arch: The visualized aortic arch is of normal caliber are with normal 3 vessel morphology. Scattered calcified plaque present within the partially visualized arch in at the origin of the great vessels. No high-grade stenosis seen at the origin of the great vessels. Visualized subclavian arteries widely patent.  Right carotid system: Right common carotid artery bottle opacified to the level of the carotid bifurcation. There are is heavy calcified atheromatous plaque seen about the right carotid bifurcation extending into the proximal right ICA. There is associated stenosis of approximately 70-80% by NASCET criteria. The area of involvement extends from the carotid bifurcation and  measures approximately 6 mm in length. There is additional coarse calcification slightly more distally within the right ICA without significant stenosis. Distally, the right ICA is well opacified to the skullbase. Prominent arterial calcifications present within the cavernous right ICA.  Left carotid system: Scattered calcified plaque present at the origin of the left common carotid artery without significant stenosis. Left common carotid artery well opacified to the carotid bifurcation. There is extensive coarse atheromatous calcified plaque at the left carotid bifurcation extending into the proximal left ICA. There is associated stenosis of up to 70-80% by NASCET criteria. The area of involvement extends from the carotid bifurcation in measures approximately 12 mm in length. Distally, the left ICA is well opacified to the skullbase. Scattered atheromatous plaque present within the cavernous left ICA.  Vertebral arteries:Both vertebral artery arise from the subclavian arteries. Minimal atheromatous plaque present at the origin of the vertebral arteries bilaterally, slightly greater on the right. No significant stenosis appreciated. Vertebral arteries well a opacified to the level of the skullbase without evidence of occlusion or high-grade stenosis. The intracranial right vertebral artery is fairly diminutive with a small hypoplastic and stenotic branch extending to the vertebrobasilar junction beyond the takeoff of the right posterior inferior cerebral artery. This is similar as seen on prior MRA.  Skeleton: Moderate to advanced multilevel  degenerative disc disease present within the visualized spine as evidenced by intervertebral disc space narrowing, endplate sclerosis, and osteophytosis. Changes are most severe at the C4-5 through C7-T1 levels. No acute osseous abnormality. No worrisome lytic or blastic osseous lesion.  Other neck: No acute soft tissue abnormality identified within the neck. No adenopathy.  Thyroid gland within normal limits. Visualized superior mediastinum within normal limits. Visualized lung apices are clear.  IMPRESSION: 1. Heavy atheromatous plaque about the carotid bifurcations extending into the proximal ICAs bilaterally. There is associated stenoses of approximately 70-80% by NASCET criteria bilaterally, slightly worse on the left. The area of involvement extends from the carotid bifurcations bilaterally, and extends approximately 6 mm in length on the right, and 12 mm in length on the left. 2. Widely patent vertebral arteries bilaterally. The distal intracranial aspect of the right vertebral artery is markedly diminutive and stenotic as compared to the left. This is similar as seen on prior intracranial MRA.   Electronically Signed   By: Jeannine Boga M.D.   On: 09/24/2014 00:52   Mr Brain Wo Contrast  09/23/2014   CLINICAL DATA:  59 year old male with slurred speech and left facial numbness. Person history of previous lacunar infarcts. Initial encounter.  EXAM: MRI HEAD WITHOUT CONTRAST  MRA HEAD WITHOUT CONTRAST  TECHNIQUE: Multiplanar, multiecho pulse sequences of the brain and surrounding structures were obtained without intravenous contrast. Angiographic images of the head were obtained using MRA technique without contrast.  COMPARISON:  Head CT without contrast 0957 hr today. Brain MRI and MRA 02/28/2014.  FINDINGS: MRI HEAD FINDINGS  Stable cerebral volume since 2015. Major intracranial vascular flow voids are stable. No restricted diffusion to suggest acute infarction. No midline shift, mass effect, evidence of mass lesion, ventriculomegaly, extra-axial collection or acute intracranial hemorrhage. Cervicomedullary junction and pituitary are within normal limits. Negative visualized cervical spine. Stable gray and white matter signal throughout the brain.  Visible internal auditory structures appear normal. Stable mastoids and paranasal sinuses. Stable orbits soft tissues.  Visualized scalp soft tissues are within normal limits. Stable bone marrow signal.  MRA HEAD FINDINGS  Chronically decreased antegrade flow signal in the distal right vertebral artery which appears atherosclerotic and stenotic. The right PICA remains patent. Stable antegrade flow signal in the distal left vertebral artery. Left PICA is patent. Stable basilar artery without stenosis. SCA and PCA origins are stable and within normal limits. Bilateral PCA branches are stable with mild left P2 segment stenosis, preserved distal PCA flow. Stable posterior communicating arteries.  Stable antegrade flow in both ICA siphons with moderate left greater than right siphon irregularity corresponding to calcified atherosclerotic plaque on the earlier CT. Up to mild siphon stenosis on the left. Ophthalmic artery origins are within normal limits. Carotid termini remain patent.  Dominant right ACA A1 segment again noted. Anterior communicating artery and visualized bilateral ACA branches are stable and within normal limits. Visualized left MCA branches are stable and within normal limits.  IMPRESSION: 1. No acute intracranial abnormality. Stable non contrast MRI appearance of the brain since 2015. 2. Stable intracranial MRA; chronically diseased and stenotic distal right vertebral artery, and left greater than right carotid siphon atherosclerosis.   Electronically Signed   By: Genevie Ann M.D.   On: 09/23/2014 15:29   Mr Jodene Nam Head/brain Wo Cm  09/23/2014   CLINICAL DATA:  59 year old male with slurred speech and left facial numbness. Person history of previous lacunar infarcts. Initial encounter.  EXAM: MRI HEAD WITHOUT CONTRAST  MRA HEAD  WITHOUT CONTRAST  TECHNIQUE: Multiplanar, multiecho pulse sequences of the brain and surrounding structures were obtained without intravenous contrast. Angiographic images of the head were obtained using MRA technique without contrast.  COMPARISON:  Head CT without contrast 0957 hr today. Brain MRI  and MRA 02/28/2014.  FINDINGS: MRI HEAD FINDINGS  Stable cerebral volume since 2015. Major intracranial vascular flow voids are stable. No restricted diffusion to suggest acute infarction. No midline shift, mass effect, evidence of mass lesion, ventriculomegaly, extra-axial collection or acute intracranial hemorrhage. Cervicomedullary junction and pituitary are within normal limits. Negative visualized cervical spine. Stable gray and white matter signal throughout the brain.  Visible internal auditory structures appear normal. Stable mastoids and paranasal sinuses. Stable orbits soft tissues. Visualized scalp soft tissues are within normal limits. Stable bone marrow signal.  MRA HEAD FINDINGS  Chronically decreased antegrade flow signal in the distal right vertebral artery which appears atherosclerotic and stenotic. The right PICA remains patent. Stable antegrade flow signal in the distal left vertebral artery. Left PICA is patent. Stable basilar artery without stenosis. SCA and PCA origins are stable and within normal limits. Bilateral PCA branches are stable with mild left P2 segment stenosis, preserved distal PCA flow. Stable posterior communicating arteries.  Stable antegrade flow in both ICA siphons with moderate left greater than right siphon irregularity corresponding to calcified atherosclerotic plaque on the earlier CT. Up to mild siphon stenosis on the left. Ophthalmic artery origins are within normal limits. Carotid termini remain patent.  Dominant right ACA A1 segment again noted. Anterior communicating artery and visualized bilateral ACA branches are stable and within normal limits. Visualized left MCA branches are stable and within normal limits.  IMPRESSION: 1. No acute intracranial abnormality. Stable non contrast MRI appearance of the brain since 2015. 2. Stable intracranial MRA; chronically diseased and stenotic distal right vertebral artery, and left greater than right carotid siphon  atherosclerosis.   Electronically Signed   By: Genevie Ann M.D.   On: 09/23/2014 15:29     EKG Interpretation   Date/Time:  Wednesday September 23 2014 09:38:43 EST Ventricular Rate:  86 PR Interval:  176 QRS Duration: 113 QT Interval:  425 QTC Calculation: 508 R Axis:   34 Text Interpretation:  Sinus rhythm Incomplete right bundle branch block  Probable anteroseptal infarct, old Nonspecific T abnormalities, lateral  leads Prolonged QT interval No significant change since last tracing  Confirmed by Alvino Chapel  MD, Ovid Curd [phone]) on 09/23/2014 10:16:18 AM      MDM   Final diagnoses:  Stroke (cerebrum)  Anemia, unspecified anemia type  Hyperglycemia    Patient with possible stroke. Generalized weakness but also localizes. Also found to be anemic. Patient refused rectal exam. Will admit to internal medicine. Had been seen by neurology.    Jasper Riling. Alvino Chapel, MD 09/24/14 [phone]

## 2014-09-24 ENCOUNTER — Encounter (HOSPITAL_COMMUNITY)
Admission: EM | Disposition: A | Discharge status: Home / Self Care | Payer: Medicare Other | Source: Home / Self Care | Attending: Internal Medicine

## 2014-09-24 ENCOUNTER — Encounter (HOSPITAL_COMMUNITY): Payer: Self-pay | Admitting: Radiology

## 2014-09-24 DIAGNOSIS — I739 Peripheral vascular disease, unspecified: Secondary | ICD-10-CM

## 2014-09-24 DIAGNOSIS — I6789 Other cerebrovascular disease: Secondary | ICD-10-CM

## 2014-09-24 DIAGNOSIS — G451 Carotid artery syndrome (hemispheric): Secondary | ICD-10-CM

## 2014-09-24 DIAGNOSIS — D649 Anemia, unspecified: Secondary | ICD-10-CM | POA: Insufficient documentation

## 2014-09-24 DIAGNOSIS — I6523 Occlusion and stenosis of bilateral carotid arteries: Secondary | ICD-10-CM

## 2014-09-24 HISTORY — PX: ESOPHAGOGASTRODUODENOSCOPY: SHX5428

## 2014-09-24 LAB — IRON AND TIBC
Iron: 23 ug/dL — ABNORMAL LOW (ref 42–165)
Saturation Ratios: 5 % — ABNORMAL LOW (ref 20–55)
TIBC: 466 ug/dL — ABNORMAL HIGH (ref 215–435)
UIBC: 443 ug/dL — ABNORMAL HIGH (ref 125–400)

## 2014-09-24 LAB — CBC
HCT: 29.3 % — ABNORMAL LOW (ref 39.0–52.0)
Hemoglobin: 8.5 g/dL — ABNORMAL LOW (ref 13.0–17.0)
MCH: 19.5 pg — ABNORMAL LOW (ref 26.0–34.0)
MCHC: 29 g/dL — ABNORMAL LOW (ref 30.0–36.0)
MCV: 67 fL — ABNORMAL LOW (ref 78.0–100.0)
Platelets: 242 10*3/uL (ref 150–400)
RBC: 4.37 MIL/uL (ref 4.22–5.81)
RDW: 18.5 % — ABNORMAL HIGH (ref 11.5–15.5)
WBC: 5.6 10*3/uL (ref 4.0–10.5)

## 2014-09-24 LAB — TYPE AND SCREEN
ABO/RH(D): B POS
Antibody Screen: NEGATIVE
Unit division: 0
Unit division: 0

## 2014-09-24 LAB — BASIC METABOLIC PANEL
Anion gap: 6 (ref 5–15)
BUN: 13 mg/dL (ref 6–23)
CO2: 28 mmol/L (ref 19–32)
Calcium: 8.6 mg/dL (ref 8.4–10.5)
Chloride: 102 mmol/L (ref 96–112)
Creatinine, Ser: 0.83 mg/dL (ref 0.50–1.35)
GFR calc Af Amer: >90 mL/min (ref >90)
GFR calc non Af Amer: >90 mL/min (ref >90)
Glucose, Bld: 207 mg/dL — ABNORMAL HIGH (ref 70–99)
Potassium: 3.9 mmol/L (ref 3.5–5.1)
Sodium: 136 mmol/L (ref 135–145)

## 2014-09-24 LAB — GLUCOSE, CAPILLARY
Glucose-Capillary: 214 mg/dL — ABNORMAL HIGH (ref 70–99)
Glucose-Capillary: 178 mg/dL — ABNORMAL HIGH (ref 70–99)
Glucose-Capillary: 200 mg/dL — ABNORMAL HIGH (ref 70–99)

## 2014-09-24 LAB — FOLATE: Folate: 11.8 ng/mL

## 2014-09-24 LAB — FERRITIN: Ferritin: 8 ng/mL — ABNORMAL LOW (ref 22–322)

## 2014-09-24 LAB — LIPID PANEL
Cholesterol: 91 mg/dL (ref 0–200)
HDL: 28 mg/dL — ABNORMAL LOW (ref >39)
LDL Cholesterol: 38 mg/dL (ref 0–99)
Total CHOL/HDL Ratio: 3.3 RATIO
Triglycerides: 123 mg/dL (ref <150)
VLDL: 25 mg/dL (ref 0–40)

## 2014-09-24 LAB — VITAMIN B12: Vitamin B-12: 394 pg/mL (ref 211–911)

## 2014-09-24 SURGERY — EGD (ESOPHAGOGASTRODUODENOSCOPY)
Anesthesia: Moderate Sedation

## 2014-09-24 MED ORDER — FERROUS SULFATE 325 (65 FE) MG PO TABS
325.0000 mg | ORAL_TABLET | Freq: Three times a day (TID) | ORAL | Status: DC
  Administered 2014-09-24 – 2014-09-25 (×3): 325 mg via ORAL
  Filled 2014-09-24 (×3): qty 1

## 2014-09-24 MED ORDER — BUTAMBEN-TETRACAINE-BENZOCAINE 2-2-14 % EX AERO
INHALATION_SPRAY | CUTANEOUS | Status: DC | PRN
  Administered 2014-09-24: 2 via TOPICAL

## 2014-09-24 MED ORDER — FENTANYL CITRATE 0.05 MG/ML IJ SOLN
INTRAMUSCULAR | Status: AC
  Filled 2014-09-24: qty 2

## 2014-09-24 MED ORDER — IOHEXOL 350 MG/ML SOLN
80.0000 mL | Freq: Once | INTRAVENOUS | Status: AC | PRN
  Administered 2014-09-24: 80 mL via INTRAVENOUS

## 2014-09-24 MED ORDER — FENTANYL CITRATE 0.05 MG/ML IJ SOLN
INTRAMUSCULAR | Status: DC | PRN
  Administered 2014-09-24 (×3): 25 ug via INTRAVENOUS

## 2014-09-24 MED ORDER — DIPHENHYDRAMINE HCL 50 MG/ML IJ SOLN
INTRAMUSCULAR | Status: AC
  Filled 2014-09-24: qty 1

## 2014-09-24 MED ORDER — MIDAZOLAM HCL 5 MG/ML IJ SOLN
INTRAMUSCULAR | Status: AC
  Filled 2014-09-24: qty 2

## 2014-09-24 MED ORDER — SODIUM CHLORIDE 0.9 % IV SOLN
INTRAVENOUS | Status: DC
  Administered 2014-09-24: 500 mL via INTRAVENOUS

## 2014-09-24 MED ORDER — ASPIRIN 81 MG PO CHEW
81.0000 mg | CHEWABLE_TABLET | Freq: Every day | ORAL | Status: DC
  Administered 2014-09-25: 81 mg via ORAL
  Filled 2014-09-24: qty 1

## 2014-09-24 MED ORDER — PNEUMOCOCCAL VAC POLYVALENT 25 MCG/0.5ML IJ INJ
0.5000 mL | INJECTION | INTRAMUSCULAR | Status: AC
  Administered 2014-09-25: 0.5 mL via INTRAMUSCULAR
  Filled 2014-09-24: qty 0.5

## 2014-09-24 MED ORDER — PANTOPRAZOLE SODIUM 40 MG PO TBEC
40.0000 mg | DELAYED_RELEASE_TABLET | Freq: Two times a day (BID) | ORAL | Status: DC
  Administered 2014-09-24 – 2014-09-25 (×2): 40 mg via ORAL
  Filled 2014-09-24 (×2): qty 1

## 2014-09-24 MED ORDER — MIDAZOLAM HCL 10 MG/2ML IJ SOLN
INTRAMUSCULAR | Status: DC | PRN
  Administered 2014-09-24 (×2): 2 mg via INTRAVENOUS
  Administered 2014-09-24: 1 mg via INTRAVENOUS

## 2014-09-24 NOTE — Op Note (Signed)
[facility] [place], [id number]   ENDOSCOPY PROCEDURE REPORT  PATIENT: Brad, [name]  MR#: [id number] BIRTHDATE: 09/26/1955 , 58  yrs. old GENDER: male ENDOSCOPIST:Tamiki Kuba Oletta Lamas, MD REFERRED BY: [name], M.D. [facility] PROCEDURE DATE:  09/24/2014 PROCEDURE:   EGD w/ biopsy ASA CLASS:    Class III INDICATIONS: profound anemia and gentleman who has been taking NSAIDs does need to be on antiplatelet agents for history of previous strokes.  He presented to the ER with neurological symptoms that subsequently resolved. MEDICATION: Fentanyl 75 mcg IV and Versed 5 mg IV TOPICAL ANESTHETIC:   Cetacaine Spray  DESCRIPTION OF PROCEDURE:   After the risks and benefits of the procedure were explained, informed consent was obtained.  The Pentax Gastroscope [id number]  endoscope was introduced through the mouth  and advanced to the second portion of the duodenum .  The instrument was slowly withdrawn as the mucosa was fully examined.      ESOPHAGUS: The mucosa of the esophagus appeared normal.  STOMACH: The mucosa of the stomach appeared normal.  Multiple biopsies were performed using cold forceps.  Sample obtained for helicobacter pylori testing.  DUODENUM: Three small non-bleeding ulcers were found in the duodenal bulb.    Retroflexed views revealed no abnormalities.    The scope was then withdrawn from the patient and the procedure completed.  COMPLICATIONS: There were no immediate complications.  ENDOSCOPIC IMPRESSION: 1.   The mucosa of the esophagus appeared normal 2.   The mucosa of the stomach appeared normal; multiple biopsies were performed 3.   Three small ulcers were found in the duodenal bulb . These small ulcers may have been chronically bleeding and probably have been related to his NSAID use RECOMMENDATIONS: Would switch to oral PPI therapy.  Would like to follow is outpatient and if he continues to have  positive stools and anemia may need colonoscopy at some point in the future   _______________________________ eSigned:  Laurence Spates, MD 09/24/2014 12:30 PM     cc: Biagio Borg, MD  CPT CODES: ICD CODES:  The ICD and CPT codes recommended by this software are interpretations from the data that the clinical staff has captured with the software.  The verification of the translation of this report to the ICD and CPT codes and modifiers is the sole responsibility of the health care institution and practicing physician where this report was generated.  [facility] [place] [facility]. will not be held responsible for the validity of the ICD and CPT codes included on this report.  AMA assumes no liability for data contained or not contained herein. CPT is a Designer, television/film set of the [employer].  PATIENT NAME:  Brad, [name] MR#: [id number]

## 2014-09-24 NOTE — Evaluation (Signed)
Physical Therapy Evaluation Patient Details Name: Brad [name] MRN: [id number] DOB: 09-25-1955 Today's Date: 09/24/2014   History of Present Illness  Patient is a 59 y/o male with PMH of tobacco abuse, DM II, HTN and carotid artery stenosis who presents to the ED with complaints of slurred speech and left facial numbness.  Labs revealed Hgb 6.6. CT head (-). Doppler-Right = 60-79% ICA stenosis. Left = 80-99% ICA stenosis. S/p EGD.    Clinical Impression  Patient presents close to functional baseline and able to ambulate community distances while performing higher level balance challenges without difficulty. Tolerated stair negotiation without difficulty. Pt reports all symptoms have resolved and only complains of burning in calves with increased distances - premorbid. Pt does not require further skilled therapy services. Encourage mobility daily with supervision for safety to maintain strength. Discharge from therapy.    Follow Up Recommendations No PT follow up;Supervision - Intermittent    Equipment Recommendations  None recommended by PT    Recommendations for Other Services       Precautions / Restrictions Precautions Precautions: None Restrictions Weight Bearing Restrictions: No      Mobility  Bed Mobility               General bed mobility comments: Received sitting EOB upon PT arrival.   Transfers Overall transfer level: Needs assistance Equipment used: None Transfers: Sit to/from Stand Sit to Stand: Independent            Ambulation/Gait Ambulation/Gait assistance: Independent Ambulation Distance (Feet): 300 Feet Assistive device: None Gait Pattern/deviations: Step-through pattern;Decreased stride length     General Gait Details: Pt with steady gait. Dyspnea present however vitals stable. Pt reports this as baseline. Tolerated higher level balance challenges without LOB.  Stairs Stairs: Yes Stairs assistance: Modified independent  (Device/Increase time) Stair Management: Two rails;Alternating pattern;Forwards Number of Stairs: 2 (+3 steps x2 bouts) General stair comments: Mod I with use of rails.   Wheelchair Mobility    Modified Rankin (Stroke Patients Only)       Balance Overall balance assessment: Needs assistance Sitting-balance support: Feet supported;No upper extremity supported Sitting balance-Leahy Scale: Good     Standing balance support: During functional activity Standing balance-Leahy Scale: Good               High level balance activites: Backward walking;Direction changes;Turns;Sudden stops;Head turns High Level Balance Comments: Tolerated above higher level balance activities with only minor deviations in gait, no overt LOB. Able to step over objects simultaneously without difficulty. Standardized Balance Assessment Standardized Balance Assessment : Dynamic Gait Index   Dynamic Gait Index Level Surface: Normal Change in Gait Speed: Normal Gait with Horizontal Head Turns: Mild Impairment Gait with Vertical Head Turns: Normal Gait and Pivot Turn: Normal Step Over Obstacle: Mild Impairment Step Around Obstacles: Normal Steps: Mild Impairment Total Score: 21       Pertinent Vitals/Pain Pain Assessment: Faces Faces Pain Scale: Hurts a little bit Pain Location: Bil calves with ambulation Pain Descriptors / Indicators: Burning Pain Intervention(s): Monitored during session;Repositioned    Home Living Family/patient expects to be discharged to:: Private residence   Available Help at Discharge: Family Type of Home: House Home Access: Stairs to enter   Technical brewer of Steps: 2 Home Layout: One level Home Equipment: None;Shower seat Additional Comments: But pt reports he does not use it.    Prior Function Level of Independence: Independent         Comments: Works in the yard with difficulty -  multilple rest breaks needed.     Hand Dominance   Dominant  Hand: Right    Extremity/Trunk Assessment   Upper Extremity Assessment: Defer to OT evaluation           Lower Extremity Assessment: Overall WFL for tasks assessed (Reports burning in bil calves with increased distances - premorbid,.)         Communication   Communication: No difficulties  Cognition Arousal/Alertness: Awake/alert Behavior During Therapy: WFL for tasks assessed/performed Overall Cognitive Status: Within Functional Limits for tasks assessed                      General Comments      Exercises        Assessment/Plan    PT Assessment Patent does not need any further PT services  PT Diagnosis     PT Problem List    PT Treatment Interventions     PT Goals (Current goals can be found in the Care Plan section) Acute Rehab PT Goals PT Goal Formulation: All assessment and education complete, DC therapy    Frequency     Barriers to discharge        Co-evaluation               End of Session Equipment Utilized During Treatment: Gait belt Activity Tolerance: Patient tolerated treatment well Patient left: in bed;with call bell/phone within reach;with family/visitor present (sitting EOB.)           Time: 6789-3810 PT Time Calculation (min) (ACUTE ONLY): 14 min   Charges:   PT Evaluation $Initial PT Evaluation Tier I: 1 Procedure     PT G CodesCandy Sledge A 10-06-14, 1:24 PM Candy Sledge, [place], DPT [phone]

## 2014-09-24 NOTE — Progress Notes (Signed)
OT Cancellation Note  Patient Details Name: Brad [name] MRN: [id number] DOB: 1956-03-17   Cancelled Treatment:    Reason Eval/Treat Not Completed: Patient at procedure or test/ unavailable Will return to assess as able.  Rexanne Inocencio,HILLARY 09/24/2014, 11:35 AM  Maurie Boettcher, OTR/L  [phone] 09/24/2014

## 2014-09-24 NOTE — Progress Notes (Signed)
*  PRELIMINARY RESULTS* Echocardiogram 2D Echocardiogram has been performed.  Lysle Rubens 09/24/2014, 11:05 AM

## 2014-09-24 NOTE — Progress Notes (Signed)
STROKE TEAM PROGRESS NOTE   HISTORY Brad [name] is a 59 y.o. male history of previous strokes, diabetes mellitus, hypertension,, carotid stenosis and COPD, is entering with history of acute onset of slurred speech, left-sided weakness including facial droop, as well as visual changes in the left side at 8 AM this morning. He describes vision changes as blurring and spots. His wife indicated that he drags his left leg when he walked. He had previous stroke in July 2015. He has been on aspirin 81 mg per day. He also has peripheral vascular disease and has been on Trental. CT scan of his head showed old right thalamic infarction but no acute changes. NIH stroke score was 1 for residual sensory changes on the left. Other deficits resolved after arriving in the emergency room. He is being admitted for possible recurrent stroke.  LSN: 8 AM on 09/23/2014 tPA Given: No; rapid resolution of deficits   SUBJECTIVE (INTERVAL HISTORY) No family members present. The patient feels his deficits have resolved. Once again the patient was encouraged to quit smoking. The patient is scheduled for an upper endoscopy today to evaluate severe anemia. The patient also describes claudication symptoms. He has been evaluated by Dr. Trula Slade in the past for claudication and carotid stenosis.   OBJECTIVE Temp:  [97.5 F (36.4 C)-98.4 F (36.9 C)] 98.3 F (36.8 C) (02/18 0522) Pulse Rate:  [72-91] 74 (02/18 0522) Cardiac Rhythm:  [-] Normal sinus rhythm (02/17 2031) Resp:  [15-23] 18 (02/18 0522) BP: (122-171)/(56-82) 139/68 mmHg (02/18 0522) SpO2:  [94 %-100 %] 99 % (02/18 0522) Weight:  [122.471 kg (270 lb)] 122.471 kg (270 lb) (02/17 1253)   Recent Labs Lab 09/23/14 2139 09/24/14 0641  GLUCAP 261* 214*    Recent Labs Lab 09/23/14 0950 09/23/14 0956  NA 132* 133*  K 3.9 4.1  CL 96 94*  CO2 29  --   GLUCOSE 342* 349*  BUN 15 17  CREATININE 0.99 1.00  CALCIUM 8.3*  --     Recent Labs Lab  09/23/14 0950  AST 38*  ALT 30  ALKPHOS 138*  BILITOT 0.5  PROT 6.2  ALBUMIN 2.5*    Recent Labs Lab 09/23/14 0950 09/23/14 0956 09/23/14 1115  WBC 7.9  --  6.5  NEUTROABS 5.4  --   --   HGB 7.0* 9.2* 6.6*  HCT 25.6* 27.0* 23.5*  MCV 66.0*  --  65.8*  PLT 261  --  223   No results for input(s): CKTOTAL, CKMB, CKMBINDEX, TROPONINI in the last 168 hours.  Recent Labs  09/23/14 0950  LABPROT 13.9  INR 1.05    Recent Labs  09/23/14 1018  COLORURINE YELLOW  LABSPEC 1.022  PHURINE 5.5  GLUCOSEU 500*  HGBUR NEGATIVE  BILIRUBINUR NEGATIVE  KETONESUR NEGATIVE  PROTEINUR >300*  UROBILINOGEN 0.2  NITRITE NEGATIVE  LEUKOCYTESUR NEGATIVE       Component Value Date/Time   CHOL 119 03/01/2014 0600   TRIG 136 03/01/2014 0600   HDL 29* 03/01/2014 0600   CHOLHDL 4.1 03/01/2014 0600   VLDL 27 03/01/2014 0600   LDLCALC 63 03/01/2014 0600   Lab Results  Component Value Date   HGBA1C 6.3* 02/28/2014      Component Value Date/Time   LABOPIA NONE DETECTED 09/23/2014 1018   COCAINSCRNUR NONE DETECTED 09/23/2014 1018   LABBENZ NONE DETECTED 09/23/2014 1018   AMPHETMU NONE DETECTED 09/23/2014 1018   THCU NONE DETECTED 09/23/2014 1018   LABBARB NONE DETECTED 09/23/2014 1018  Recent Labs Lab 09/23/14 0950  ETH <5   2-D echocardiogram 09/24/2014 Study Conclusions - Left ventricle: The cavity size was normal. There was severe focal basal and moderate concentric hypertrophy. Systolic function was normal. The estimated ejection fraction was in the range of 55% to 60%. Wall motion was normal; there were no regional wall motion abnormalities. Features are consistent with a pseudonormal left ventricular filling pattern, with concomitant abnormal relaxation and increased filling pressure (grade 2 diastolic dysfunction). - Aortic valve: Moderate thickening and calcification, consistent with sclerosis. - Mitral valve: Calcified annulus. There was  trivial regurgitation. - Atrial septum: There was increased thickness of the septum, consistent with lipomatous hypertrophy.  Ct Head Wo Contrast 09/23/2014    1. No acute intracranial findings.  2. Chronic small vessel disease with remote right thalamus lacunar infarct.     Ct Angio Neck W/cm &/or Wo/cm 09/24/2014    1. Heavy atheromatous plaque about the carotid bifurcations extending into the proximal ICAs bilaterally. There is associated stenoses of approximately 70-80% by NASCET criteria bilaterally, slightly worse on the left. The area of involvement extends from the carotid bifurcations bilaterally, and extends approximately 6 mm in length on the right, and 12 mm in length on the left.  2. Widely patent vertebral arteries bilaterally. The distal intracranial aspect of the right vertebral artery is markedly diminutive and stenotic as compared to the left. This is similar as seen on prior intracranial MRA.     Mr Brain Wo Contrast 09/23/2014    1. No acute intracranial abnormality. Stable non contrast MRI appearance of the brain since 2015.  2. Stable intracranial MRA; chronically diseased and stenotic distal right vertebral artery, and left greater than right carotid siphon atherosclerosis.     Mr Jodene Nam Head/brain Wo Cm 09/23/2014    1. No acute intracranial abnormality. Stable non contrast MRI appearance of the brain since 2015.  2. Stable intracranial MRA; chronically diseased and stenotic distal right vertebral artery, and left greater than right carotid siphon atherosclerosis.     CUS - Preliminary findings: Right = 60-79% ICA stenosis. Left = 80-99% ICA stenosis. Stenosis has worsened bilaterally since last study 02/2014 where previously the right was 1-39% range and the left was 60-79% range.  PHYSICAL EXAM  Temp:  [97.5 F (36.4 C)-98.6 F (37 C)] 98.6 F (37 C) (02/18 1420) Pulse Rate:  [66-91] 70 (02/18 1420) Resp:  [14-21] 18 (02/18 1420) BP: (122-224)/(59-95) 122/68  mmHg (02/18 1420) SpO2:  [94 %-100 %] 98 % (02/18 1420)  General - Well nourished, well developed, in no apparent distress.  Ophthalmologic - Sharp disc margins OU.  Cardiovascular - Regular rate and rhythm with no murmur.  Mental Status -  Level of arousal and orientation to time, place, and person were intact. Language including expression, naming, repetition, comprehension was assessed and found intact. Fund of Knowledge was assessed and was intact.  Cranial Nerves II - XII - II - Visual field intact OU. III, IV, VI - Extraocular movements intact. V - Facial sensation intact bilaterally. VII - Facial movement intact bilaterally. VIII - Hearing & vestibular intact bilaterally. X - Palate elevates symmetrically. XI - Chin turning & shoulder shrug intact bilaterally. XII - Tongue protrusion intact.  Motor Strength - The patient's strength was normal in all extremities except right shoulder limited ROM due to previous surgery.  Bulk was normal and fasciculations were absent.   Motor Tone - Muscle tone was assessed at the neck and appendages and was normal.  Reflexes - The patient's reflexes were normal in all extremities and he had no pathological reflexes.  Sensory - Light touch, temperature/pinprick, vibration and proprioception, and Romberg testing were assessed and were normal.    Coordination - The patient had normal movements in the hands and feet with no ataxia or dysmetria.  Tremor was absent.  Gait and Station - The patient's transfers, posture, gait, station, and turns were observed as normal.   ASSESSMENT/PLAN Mr. KIERRE HINTZ is a 59 y.o. male with history of  previous strokes, diabetes mellitus, hypertension, carotid stenosis, and COPD presenting with acute onset of slurred speech, left hemiparesis, left facial droop. He did not receive IV t-PA due to rapid resolution of deficits..   TIA:  Non-dominant right hemisphere TIA possible in the setting of bilateral  70-80% ICA stenosis with heavy calcification.  Resultant resolution of deficits.  MRI  no acute infarct  MRA  not remarkable  CTA of the neck - stenoses of approximately 70-80% by NASCET criteria bilaterally, slightly worse on the left.  Carotid Doppler  RICA 89-38% and LICA 10-17%, progressed from 02/2014.  2D Echo  EF 55-60%. No cardiac source of emboli identified.  LDL 63, at the goal  HgbA1c pending  SCDs for VTE prophylaxis  Heart healthy diet with thin liquids  aspirin 81 mg orally every day and cilostazol prior to admission, now on ASA and cilostazol for stroke and PVD prevention.    Ongoing aggressive stroke risk factor management  Therapy recommendations: Pending  Disposition: Pending  Bilateral carotid artery high-grade stenosis  Also confirmed on carotid Doppler  [name] follows as outpatient.   Dr Bridgett Larsson has been asked to consult.  May consider CEA  PVD  On cilostazol  Follow up with Dr. Trula Slade as outpt  Symptoms of claudication  Hypertension  Home meds: Lisinopril 20 mg daily  No BP meds this time  elevated blood pressures today - otherwise stable.   BP goal 130-150 due to high-grade stenosis bilateral ICAs  Avoid hypotension  Hyperlipidemia  Home meds:  Zocor 40 mg daily resumed in hospital  LDL 63, goal < 70  Continue statin at discharge  Diabetes  HgbA1c pending, goal < 7.0  Uncontrolled  Glucose fluctuates  On Lantus  Insulin sliding scale  Diabetes education  Anemia  EGD today per Dr. Oletta Lamas - 3 small ulcers found in the duodenal bulb probably related to NSAID use.  Oral proton pump inhibitor recommended - ordered  further follow-up with Dr. Oletta Lamas following discharge.  Aspirin currently on hold.  Patient on Pletal for claudication.   H/H - 6.6 / 23.5 yesterday  Tobacco abuse  Current smoker  Smoking cessation counseling provided  Pt is willing to quit  Other Stroke Risk Factors  ETOH  use  Obesity, Body mass index is 37.67 kg/(m^2).   Hx stroke/TIA  Other Active Problems  Other Pertinent History  Claudication symptoms   Hospital day # 1  Mikey Bussing PA-C [facility] Pager [phone] 09/24/2014, 7:52 AM  I, the attending vascular neurologist, have personally obtained a history, examined the patient, evaluated laboratory data, individually viewed imaging studies and agree with radiology interpretations. Together with the NP/PA, we formulated the assessment and plan of care which reflects our mutual decision.  I have made any additions or clarifications directly to the above note and agree with the findings and plan as currently documented.   59 year old male with cosmetic history of hypertension, diabetes, peripheral vessel disease, current smoker,  pontine stroke July 2015 was admitted for TIA symptoms of slurry speech, left side weakness and facial droop. Carotid Doppler showed progression of bilateral carotid stenosis, right ICA 60-79% stenosis and left 80-99% stenosis. CTA of the neck showed bilaterally 70-80% stenosis with heavily calcified plaques. Will recommend vascular surgery consultation to consider carotid endarterectomy. Patient also has anemia, EGD showed duodenal ulcers. Currently patient on dural antiplatelet with aspirin and cilostazol, will continue if not contraindicated with GI. Patient continues to smoke, smoke cessation counseling again provided.   Rosalin Hawking, MD PhD Stroke Neurology 09/24/2014 3:35 PM   To contact Stroke Continuity provider, please refer to [email or web address] After hours, contact General Neurology

## 2014-09-24 NOTE — Progress Notes (Signed)
UR complete.  Fara Worthy RN, MSN 

## 2014-09-24 NOTE — Progress Notes (Signed)
PT Cancellation Note  Patient Details Name: RANDEL HARGENS MRN: [id number] DOB: 17-Mar-1956   Cancelled Treatment:    Reason Eval/Treat Not Completed: Patient at procedure or test/unavailable pt off floor at EGD. Will follow up next available time to perform PT evaluation.   Candy Sledge A 09/24/2014, 12:04 PM Candy Sledge, [place], DPT [phone]

## 2014-09-24 NOTE — Interval H&P Note (Signed)
History and Physical Interval Note:  09/24/2014 11:52 AM  Brad [name]  has presented today for surgery, with the diagnosis of anemia  The various methods of treatment have been discussed with the patient and family. After consideration of risks, benefits and other options for treatment, the patient has consented to  Procedure(s): ESOPHAGOGASTRODUODENOSCOPY (EGD) (N/A) as a surgical intervention .  The patient's history has been reviewed, patient examined, no change in status, stable for surgery.  I have reviewed the patient's chart and labs.  Questions were answered to the patient's satisfaction.     Lesa Vandall JR,Kehlani Vancamp L

## 2014-09-24 NOTE — Care Management Note (Signed)
    Page 1 of 1   09/24/2014     11:32:22 AM CARE MANAGEMENT NOTE 09/24/2014  Patient:  Brad [name], Brad [name]   Account Number:  [id number]  Date Initiated:  09/24/2014  Documentation initiated by:  Lorne Skeens  Subjective/Objective Assessment:   Patient was admitted with anemia, TIA. Lives at home with parent.     Action/Plan:   Will follow for discharge needs pending PT/OT evals and physician orders.   Anticipated DC Date:     Anticipated DC Plan:  HOME/SELF CARE         Choice offered to / List presented to:             Status of service:  In process, will continue to follow Medicare Important Message given?   (If response is "NO", the following Medicare IM given date fields will be blank) Date Medicare IM given:   Medicare IM given by:   Date Additional Medicare IM given:   Additional Medicare IM given by:    Discharge Disposition:    Per UR Regulation:  Reviewed for med. necessity/level of care/duration of stay  If discussed at [place] of Stay Meetings, dates discussed:    Comments:

## 2014-09-24 NOTE — Consult Note (Addendum)
Hospital Consult    Reason for Consult:  Carotid artery stenosis Referring Physician:  [name] MRN #:  [id number]  History of Present Illness: This is a 59 y.o. male who we've been consulted on regarding carotid artery stenosis. Yesterday morning, the patient noticed acute onset of slurred speech and left sided weakness and left visual changes. His wife also noticed that he was dragging his left leg when he walked. He does have a hx of remote lacunar infarcts involving the right thalamus and left putamen based on MRI of brain performed on 02/28/2014.  Doppler ultrasound performed on 03/01/2014 showed a left 60-79% ICA stenosis.    During his hospitalization in July 2015, he presented to the hospital with c/o dizziness, clumsiness, unsteady gait, facial droop, slurred speech, and right sided weakness.  A statin was started at that time.  Aspirin was changed to 81mg  daily since he was on Pletal for PAD followed by Dr. Trula Slade, which he was last seen November 2015.  At that time, Dr. Trula Slade had reviewed his most recent dopplers and it was felt that the patient had a left brain stroke. He was seen and evaluated by neurology, and they felt that his stroke was secondary to intracranial pathology rather than extracranial left carotid stenosis. Left carotid endarterectomy versus stenting was not recommended at that time. He has been following the patient with surveillance q6 months.  Additionally, pt has a hx of tobacco abuse, type 2 diabetes, HTN, and carotid artery stenosis.     Past Medical History  Diagnosis Date  . Depression   . GERD (gastroesophageal reflux disease)   . Diabetes mellitus type II   . COPD (chronic obstructive pulmonary disease)   . Ventral hernia   . Chronic LBP   . Hyperlipidemia 08/27/2014  . Hypothyroidism 08/27/2014  . Carotid stenosis 08/27/2014    Past Surgical History  Procedure Laterality Date  . Neg stress test  2000  . Rotator cuff repair  2009    multiple f/u  Sxs/infection Tx  . Incision and drainage perirectal abscess  03/2009    Allergies  Allergen Reactions  . Doxycycline Hives and Itching  . Lipitor [Atorvastatin] Other (See Comments)    Myalgia   . Oxycodone Itching    Prior to Admission medications   Medication Sig Start Date End Date Taking? Authorizing Provider  albuterol (PROVENTIL HFA;VENTOLIN HFA) 108 (90 BASE) MCG/ACT inhaler Inhale 2 puffs into the lungs every 6 (six) hours as needed for wheezing or shortness of breath. 10/10/13  Yes Biagio Borg, MD  aspirin EC 81 MG tablet Take 1 tablet (81 mg total) by mouth daily. 03/01/14  Yes Reyne Dumas, MD  Blood Pressure Monitor DEVI Use to check Blood pressure daily 05/25/14  Yes Biagio Borg, MD  cilostazol (PLETAL) 100 MG tablet TAKE 1 TABLET BY MOUTH TWICE DAILY 05/25/14  Yes Serafina Mitchell, MD  furosemide (LASIX) 40 MG tablet 1 tab by mouth in the AM, with a second dose in PM as needed only for swelling 08/27/14  Yes Biagio Borg, MD  glipiZIDE (GLIPIZIDE XL) 2.5 MG 24 hr tablet Take 1 tablet (2.5 mg total) by mouth daily with breakfast. As needed for blood sugar > 200 03/19/14  Yes Biagio Borg, MD  glucosamine-chondroitin 500-400 MG tablet Take 2 tablets by mouth daily.   Yes Historical Provider, MD  glucose blood test strip 1 each by Other route See admin instructions. Check blood sugar once daily.   Yes  Historical Provider, MD  HYDROcodone-homatropine (HYCODAN) 5-1.5 MG/5ML syrup Take 5 mLs by mouth every 6 (six) hours as needed. Patient not taking: Reported on 09/23/2014 06/18/14   Biagio Borg, MD  ibuprofen (ADVIL,MOTRIN) 200 MG tablet Take 800 mg by mouth every 6 (six) hours as needed for mild pain or moderate pain.   Yes Historical Provider, MD  Lancets MISC 1 each by Other route See admin instructions. Check blood sugar once daily.   Yes Historical Provider, MD  levofloxacin (LEVAQUIN) 250 MG tablet Take 1 tablet (250 mg total) by mouth daily. Patient not taking: Reported on  09/23/2014 06/18/14   Biagio Borg, MD  levothyroxine (SYNTHROID, LEVOTHROID) 88 MCG tablet Take 1 tablet (88 mcg total) by mouth daily. 08/27/14  Yes Biagio Borg, MD  lisinopril (PRINIVIL,ZESTRIL) 20 MG tablet Take 1 tablet (20 mg total) by mouth daily. 05/25/14  Yes Rosalin Hawking, MD  metFORMIN (GLUCOPHAGE) 500 MG tablet Take 1,000 mg by mouth daily.   Yes Historical Provider, MD  nortriptyline (PAMELOR) 10 MG capsule Take 1 capsule (10 mg total) by mouth at bedtime. Patient not taking: Reported on 09/23/2014 10/10/13   Biagio Borg, MD  simvastatin (ZOCOR) 40 MG tablet Take 1 tablet (40 mg total) by mouth at bedtime. 08/27/14  Yes Biagio Borg, MD  tiotropium (SPIRIVA) 18 MCG inhalation capsule Place 18 mcg into inhaler and inhale 2 (two) times daily as needed (for wheezing).   Yes Historical Provider, MD  tiZANidine (ZANAFLEX) 4 MG tablet Take 8 mg by mouth every 6 (six) hours as needed for muscle spasms.   Yes Historical Provider, MD  traMADol Veatrice Bourbon) 50 MG tablet 1-2 tabs by mouth up to four times per day 08/27/14  Yes Biagio Borg, MD    History   Social History  . Marital Status: Single    Spouse Name: N/A  . Number of Children: 0  . Years of Education: N/A   Occupational History  . Disabled    Social History Main Topics  . Smoking status: Current Every Day Smoker -- 1.50 packs/day for 40 years    Types: Cigarettes  . Smokeless tobacco: Never Used     Comment: 1-2 pks per day  . Alcohol Use: Yes     Comment: Social  . Drug Use: No  . Sexual Activity: Not on file   Other Topics Concern  . Not on file   Social History Narrative   Divorced with fiance.   Disabled since June 2010 - right shoulder   No children.           Family History  Problem Relation Age of Onset  . Alcohol abuse Father   . Lung cancer Father   . Heart disease Mother     automated implantable cardioverter-defibrillator   . Diabetes type II Mother   . Lung cancer Maternal Grandmother   . Lung cancer  Sister     ROS: [x]  Positive   [ ]  Negative   [ ]  All sytems reviewed and are negative  Cardiovascular: []  chest pain/pressure []  palpitations []  SOB lying flat []  DOE [x]  pain in legs while walking []  pain in legs at rest []  pain in legs at night []  non-healing ulcers []  hx of DVT []  swelling in legs  Pulmonary: []  productive cough []  asthma/wheezing []  home O2  Neurologic: [x]  weakness in [x]  arms [x]  legs []  numbness in []  arms []  legs [x]  hx of CVA []  mini stroke [x] difficulty  speaking or slurred speech []  temporary loss of vision in one eye []  dizziness  Hematologic: []  hx of cancer []  bleeding problems []  problems with blood clotting easily  Endocrine:   [x]  diabetes []  thyroid disease  GI []  vomiting blood []  blood in stool  GU: []  CKD/renal failure []  HD--[]  M/W/F or []  T/T/S []  burning with urination []  blood in urine  Psychiatric: []  anxiety []  depression  Musculoskeletal: []  arthritis []  joint pain  Integumentary: []  rashes []  ulcers  Constitutional: []  fever []  chills   Physical Examination  Filed Vitals:   09/24/14 1420  BP: 122/68  Pulse: 70  Temp: 98.6 F (37 C)  Resp: 18   Body mass index is 37.67 kg/(m^2).  General:  WDWN in NAD Gait: Not observed HENT: WNL, normocephalic Pulmonary: normal non-labored breathing, without rales, rhonchi,  wheezing Cardiac: regular, without  Murmurs, rubs or gallops; with right carotid bruit Abdomen: soft, NT/ND, no masses Skin: without rashes, without ulcers  Vascular Exam/Pulses: radial pulses palpable bilaterally. Palpable femoral pulses bilaterally. Non palpable pedal pulses.  Extremities: without ischemic changes, without Gangrene , without cellulitis; without open wounds;  Musculoskeletal: no muscle wasting or atrophy  Neurologic: A&O X 3; Appropriate Affect ;Speech is fluent/normal. EOMI. Tongue midline. No facial droop. Facial sensation intact. 5/5 grip strength bilaterally. 5/5  strength lower extremities.  Psychiatric: Judgment intact, Mood & affect appropriate for pt's clinical situation Lymph : No Cervical, Axillary, or Inguinal lymphadenopathy    CBC    Component Value Date/Time   WBC 5.6 09/24/2014 0811   RBC 4.37 09/24/2014 0811   RBC 4.15* 09/23/2014 2300   HGB 8.5* 09/24/2014 0811   HCT 29.3* 09/24/2014 0811   PLT 242 09/24/2014 0811   MCV 67.0* 09/24/2014 0811   MCH 19.5* 09/24/2014 0811   MCHC 29.0* 09/24/2014 0811   RDW 18.5* 09/24/2014 0811   LYMPHSABS 1.4 09/23/2014 0950   MONOABS 0.8 09/23/2014 0950   EOSABS 0.2 09/23/2014 0950   BASOSABS 0.1 09/23/2014 0950    BMET    Component Value Date/Time   NA 136 09/24/2014 0811   K 3.9 09/24/2014 0811   CL 102 09/24/2014 0811   CO2 28 09/24/2014 0811   GLUCOSE 207* 09/24/2014 0811   BUN 13 09/24/2014 0811   CREATININE 0.83 09/24/2014 0811   CALCIUM 8.6 09/24/2014 0811   GFRNONAA >90 09/24/2014 0811   GFRAA >90 09/24/2014 0811    COAGS: Lab Results  Component Value Date   INR 1.05 09/23/2014   INR 1.03 02/28/2014    Radiology: Ct Head Wo Contrast  09/23/2014   CLINICAL DATA:  Headache, dizziness, weakness. Visual change in the left eye with left-sided headache. Code stroke.  EXAM: CT HEAD WITHOUT CONTRAST  TECHNIQUE: Contiguous axial images were obtained from the base of the skull through the vertex without intravenous contrast.  COMPARISON:  02/28/2014  FINDINGS: Skull and Sinuses:Negative for fracture or destructive process. The mastoids, middle ears, and imaged paranasal sinuses are clear.  Orbits: No acute abnormality.  Brain: No evidence of acute infarction, hemorrhage, hydrocephalus, or mass lesion/mass effect. Remote lacunar infarct in the right thalamus and small remote ischemic focus in the left corona radiata. Low-attenuation along the lower right sylvian fissure is most consistent with a dilated perivascular space.  Since this is a code stroke, page to Dr. Nicole Kindred placed on  09/23/2014 at 10:04 am.  IMPRESSION: 1. No acute intracranial findings. 2. Chronic small vessel disease with remote right thalamus lacunar infarct.  Electronically Signed   By: Monte Fantasia M.D.   On: 09/23/2014 10:06   Ct Angio Neck W/cm &/or Wo/cm  09/24/2014   CLINICAL DATA:  Initial evaluation for carotid stenosis.  EXAM: CT ANGIOGRAPHY NECK  TECHNIQUE: Multidetector CT imaging of the neck was performed using the standard protocol during bolus administration of intravenous contrast. Multiplanar CT image reconstructions and MIPs were obtained to evaluate the vascular anatomy. Carotid stenosis measurements (when applicable) are obtained utilizing NASCET criteria, using the distal internal carotid diameter as the denominator.  CONTRAST:  19mL OMNIPAQUE IOHEXOL 350 MG/ML SOLN  COMPARISON:  Prior intracranial MRA from earlier on the same day.  FINDINGS: Aortic arch: The visualized aortic arch is of normal caliber are with normal 3 vessel morphology. Scattered calcified plaque present within the partially visualized arch in at the origin of the great vessels. No high-grade stenosis seen at the origin of the great vessels. Visualized subclavian arteries widely patent.  Right carotid system: Right common carotid artery bottle opacified to the level of the carotid bifurcation. There are is heavy calcified atheromatous plaque seen about the right carotid bifurcation extending into the proximal right ICA. There is associated stenosis of approximately 70-80% by NASCET criteria. The area of involvement extends from the carotid bifurcation and measures approximately 6 mm in length. There is additional coarse calcification slightly more distally within the right ICA without significant stenosis. Distally, the right ICA is well opacified to the skullbase. Prominent arterial calcifications present within the cavernous right ICA.  Left carotid system: Scattered calcified plaque present at the origin of the left common  carotid artery without significant stenosis. Left common carotid artery well opacified to the carotid bifurcation. There is extensive coarse atheromatous calcified plaque at the left carotid bifurcation extending into the proximal left ICA. There is associated stenosis of up to 70-80% by NASCET criteria. The area of involvement extends from the carotid bifurcation in measures approximately 12 mm in length. Distally, the left ICA is well opacified to the skullbase. Scattered atheromatous plaque present within the cavernous left ICA.  Vertebral arteries:Both vertebral artery arise from the subclavian arteries. Minimal atheromatous plaque present at the origin of the vertebral arteries bilaterally, slightly greater on the right. No significant stenosis appreciated. Vertebral arteries well a opacified to the level of the skullbase without evidence of occlusion or high-grade stenosis. The intracranial right vertebral artery is fairly diminutive with a small hypoplastic and stenotic branch extending to the vertebrobasilar junction beyond the takeoff of the right posterior inferior cerebral artery. This is similar as seen on prior MRA.  Skeleton: Moderate to advanced multilevel degenerative disc disease present within the visualized spine as evidenced by intervertebral disc space narrowing, endplate sclerosis, and osteophytosis. Changes are most severe at the C4-5 through C7-T1 levels. No acute osseous abnormality. No worrisome lytic or blastic osseous lesion.  Other neck: No acute soft tissue abnormality identified within the neck. No adenopathy. Thyroid gland within normal limits. Visualized superior mediastinum within normal limits. Visualized lung apices are clear.  IMPRESSION: 1. Heavy atheromatous plaque about the carotid bifurcations extending into the proximal ICAs bilaterally. There is associated stenoses of approximately 70-80% by NASCET criteria bilaterally, slightly worse on the left. The area of involvement  extends from the carotid bifurcations bilaterally, and extends approximately 6 mm in length on the right, and 12 mm in length on the left. 2. Widely patent vertebral arteries bilaterally. The distal intracranial aspect of the right vertebral artery is markedly diminutive and stenotic as compared  to the left. This is similar as seen on prior intracranial MRA.   Electronically Signed   By: Jeannine Boga M.D.   On: 09/24/2014 00:52   Mr Brain Wo Contrast  09/23/2014   CLINICAL DATA:  59 year old male with slurred speech and left facial numbness. Person history of previous lacunar infarcts. Initial encounter.  EXAM: MRI HEAD WITHOUT CONTRAST  MRA HEAD WITHOUT CONTRAST  TECHNIQUE: Multiplanar, multiecho pulse sequences of the brain and surrounding structures were obtained without intravenous contrast. Angiographic images of the head were obtained using MRA technique without contrast.  COMPARISON:  Head CT without contrast 0957 hr today. Brain MRI and MRA 02/28/2014.  FINDINGS: MRI HEAD FINDINGS  Stable cerebral volume since 2015. Major intracranial vascular flow voids are stable. No restricted diffusion to suggest acute infarction. No midline shift, mass effect, evidence of mass lesion, ventriculomegaly, extra-axial collection or acute intracranial hemorrhage. Cervicomedullary junction and pituitary are within normal limits. Negative visualized cervical spine. Stable gray and white matter signal throughout the brain.  Visible internal auditory structures appear normal. Stable mastoids and paranasal sinuses. Stable orbits soft tissues. Visualized scalp soft tissues are within normal limits. Stable bone marrow signal.  MRA HEAD FINDINGS  Chronically decreased antegrade flow signal in the distal right vertebral artery which appears atherosclerotic and stenotic. The right PICA remains patent. Stable antegrade flow signal in the distal left vertebral artery. Left PICA is patent. Stable basilar artery without  stenosis. SCA and PCA origins are stable and within normal limits. Bilateral PCA branches are stable with mild left P2 segment stenosis, preserved distal PCA flow. Stable posterior communicating arteries.  Stable antegrade flow in both ICA siphons with moderate left greater than right siphon irregularity corresponding to calcified atherosclerotic plaque on the earlier CT. Up to mild siphon stenosis on the left. Ophthalmic artery origins are within normal limits. Carotid termini remain patent.  Dominant right ACA A1 segment again noted. Anterior communicating artery and visualized bilateral ACA branches are stable and within normal limits. Visualized left MCA branches are stable and within normal limits.  IMPRESSION: 1. No acute intracranial abnormality. Stable non contrast MRI appearance of the brain since 2015. 2. Stable intracranial MRA; chronically diseased and stenotic distal right vertebral artery, and left greater than right carotid siphon atherosclerosis.   Electronically Signed   By: Genevie Ann M.D.   On: 09/23/2014 15:29   Mr Jodene Nam Head/brain Wo Cm  09/23/2014   CLINICAL DATA:  59 year old male with slurred speech and left facial numbness. Person history of previous lacunar infarcts. Initial encounter.  EXAM: MRI HEAD WITHOUT CONTRAST  MRA HEAD WITHOUT CONTRAST  TECHNIQUE: Multiplanar, multiecho pulse sequences of the brain and surrounding structures were obtained without intravenous contrast. Angiographic images of the head were obtained using MRA technique without contrast.  COMPARISON:  Head CT without contrast 0957 hr today. Brain MRI and MRA 02/28/2014.  FINDINGS: MRI HEAD FINDINGS  Stable cerebral volume since 2015. Major intracranial vascular flow voids are stable. No restricted diffusion to suggest acute infarction. No midline shift, mass effect, evidence of mass lesion, ventriculomegaly, extra-axial collection or acute intracranial hemorrhage. Cervicomedullary junction and pituitary are within normal  limits. Negative visualized cervical spine. Stable gray and white matter signal throughout the brain.  Visible internal auditory structures appear normal. Stable mastoids and paranasal sinuses. Stable orbits soft tissues. Visualized scalp soft tissues are within normal limits. Stable bone marrow signal.  MRA HEAD FINDINGS  Chronically decreased antegrade flow signal in the distal right vertebral artery  which appears atherosclerotic and stenotic. The right PICA remains patent. Stable antegrade flow signal in the distal left vertebral artery. Left PICA is patent. Stable basilar artery without stenosis. SCA and PCA origins are stable and within normal limits. Bilateral PCA branches are stable with mild left P2 segment stenosis, preserved distal PCA flow. Stable posterior communicating arteries.  Stable antegrade flow in both ICA siphons with moderate left greater than right siphon irregularity corresponding to calcified atherosclerotic plaque on the earlier CT. Up to mild siphon stenosis on the left. Ophthalmic artery origins are within normal limits. Carotid termini remain patent.  Dominant right ACA A1 segment again noted. Anterior communicating artery and visualized bilateral ACA branches are stable and within normal limits. Visualized left MCA branches are stable and within normal limits.  IMPRESSION: 1. No acute intracranial abnormality. Stable non contrast MRI appearance of the brain since 2015. 2. Stable intracranial MRA; chronically diseased and stenotic distal right vertebral artery, and left greater than right carotid siphon atherosclerosis.   Electronically Signed   By: Genevie Ann M.D.   On: 09/23/2014 15:29   Non-Invasive Vascular Imaging:   Carotid Duplex (Doppler) has been completed. Preliminary findings: Right = 60-79% ICA stenosis. Left = 80-99% ICA stenosis. Stenosis has worsened bilaterally since last study 02/2014 where previously the right was 1-39% range and the left was 60-79% range.  Statin:   Yes.   Beta Blocker:  No. Aspirin:  Yes.   ACEI:  Yes.   ARB:  No. Other antiplatelets/anticoagulants:  Yes.   Pletal   ASSESSMENT/PLAN: This is a 59 y.o. male with history of bilateral carotid stenosis presenting with non dominant right hemisphere TIA. He had an acute onset of left visual impairment, left facial droop, slurred speech and left sided weakness. His deficits have resolved. He had a prior left stroke back in July 2015 that was felt by neurology to be due to secondary to intracranial pathology rather than carotid disease.   His current carotid doppler reveals right ICA of 60-79% and left ICA stenosis of 80-99%, both of which have progressed since July 2015. He will need bilateral carotid intervention with endarterectomy versus stenting. The sequencing of his intervention will be deferred to Dr. Trula Slade who has been following his carotid disease. We will arrange outpatient follow-up.    Virgina Jock, PA-C [facility] [phone]  Addendum  I have independently interviewed and examined the patient, and I agree with the physician assistant's findings.  Pt presented with R hemispheric sx and likely L opthalmic artery sx.  I have review his CTA and he has significant disease in both carotid arteries (>80%), so the order of CEA is a matter of opinion.  Additionally, the bleeding duodenal ulcer in his GI tract will need to be addressed prior to proceeding, as full anticoagulation is necessary for both CEA and CAS.  Since Dr. Trula Slade has been seeing this patient on an outpatient basis, I will have him come by tomorrow to resume care of this patient.  Adele Barthel, MD [facility] Office: [phone] Pager: [phone]  09/24/2014, 5:25 PM

## 2014-09-24 NOTE — Progress Notes (Signed)
SLP Cancellation Note   Pt currently out of room for procedure and unable to initiate speech-language-cognitive assessment. Will continue efforts.  [name].Ed [employer] [phone]

## 2014-09-24 NOTE — Progress Notes (Signed)
[facility] PROGRESS NOTE  Brad [name]:[id number] DOB: [birth year] PCP: [name], MD  Assessment/Plan: 1-TIA;  ECHO pending.  Carotid Doppler RICA 81-19% and LICA 14-78%, Neurology following.  Aspirin and cilostazol.  On statin. He will need LFT in 1 week.  LDL 38.   2-Anemia, severe; s/p 2 unit of PRBC. Hb increase to 8. S/p endoscopy which showed multiple duodenal ulcer.  On PPI. Discussed with Dr Marquette Saa ok to continue with Cilostazol. Will need to discussed with Dr Marquette Saa if we can continue with aspirin.  He will need iron supplement.   3-Bilateral carotid stenosis. Vascular consulted.  4-Hypothyroidism continue with synthroid.  Diabetes; lantus.   Code Status: full code.  Family Communication: care discussed with wife.  Disposition Plan: remain inpatient.    Consultants:  GI  Neurology    Procedures:  ECHO pending Doppler; Preliminary findings: Right = 60-79% ICA stenosis. Left = 80-99% ICA stenosis.  Stenosis has worsened bilaterally since last study 02/2014 where previously the right was 1-39% range and the left was 60-79% range. Ct Angio Neck W/cm &/or Wo/cm 09/24/2014  1. Heavy atheromatous plaque about the carotid bifurcations extending into the proximal ICAs bilaterally. There is associated stenoses of approximately 70-80% by NASCET criteria bilaterally, slightly worse on the left.  Antibiotics:  none  HPI/Subjective: Patient denies any recent black stool.  He is feeling better. Speech better.   Objective: Filed Vitals:   09/24/14 1420  BP: 122/68  Pulse: 70  Temp: 98.6 F (37 C)  Resp: 18    Intake/Output Summary (Last 24 hours) at 09/24/14 1621 Last data filed at 09/24/14 1430  Gross per 24 hour  Intake   1025 ml  Output   1000 ml  Net     25 ml   Filed Weights   09/23/14 1253  Weight: 122.471 kg (270 lb)    Exam:   General:  Alert in no distress.   Cardiovascular: S 1, S 2  RRR  Respiratory: CTA  Abdomen: BS present, soft, nt  Musculoskeletal: no edema   Neuro; alert speech clear, following command.   Data Reviewed: Basic Metabolic Panel:  Recent Labs Lab 09/23/14 0950 09/23/14 0956 09/24/14 0811  NA 132* 133* 136  K 3.9 4.1 3.9  CL 96 94* 102  CO2 29  --  28  GLUCOSE 342* 349* 207*  BUN 15 17 13   CREATININE 0.99 1.00 0.83  CALCIUM 8.3*  --  8.6   Liver Function Tests:  Recent Labs Lab 09/23/14 0950  AST 38*  ALT 30  ALKPHOS 138*  BILITOT 0.5  PROT 6.2  ALBUMIN 2.5*   No results for input(s): LIPASE, AMYLASE in the last 168 hours. No results for input(s): AMMONIA in the last 168 hours. CBC:  Recent Labs Lab 09/23/14 0950 09/23/14 0956 09/23/14 1115 09/24/14 0811  WBC 7.9  --  6.5 5.6  NEUTROABS 5.4  --   --   --   HGB 7.0* 9.2* 6.6* 8.5*  HCT 25.6* 27.0* 23.5* 29.3*  MCV 66.0*  --  65.8* 67.0*  PLT 261  --  223 242   Cardiac Enzymes: No results for input(s): CKTOTAL, CKMB, CKMBINDEX, TROPONINI in the last 168 hours. BNP (last 3 results) No results for input(s): BNP in the last 8760 hours.  ProBNP (last 3 results) No results for input(s): PROBNP in the last 8760 hours.  CBG:  Recent Labs Lab 09/23/14 2139 09/24/14 0641  GLUCAP 261* 214*  No results found for this or any previous visit (from the past 240 hour(s)).   Studies: Ct Head Wo Contrast  09/23/2014   CLINICAL DATA:  Headache, dizziness, weakness. Visual change in the left eye with left-sided headache. Code stroke.  EXAM: CT HEAD WITHOUT CONTRAST  TECHNIQUE: Contiguous axial images were obtained from the base of the skull through the vertex without intravenous contrast.  COMPARISON:  02/28/2014  FINDINGS: Skull and Sinuses:Negative for fracture or destructive process. The mastoids, middle ears, and imaged paranasal sinuses are clear.  Orbits: No acute abnormality.  Brain: No evidence of acute infarction, hemorrhage, hydrocephalus, or mass lesion/mass  effect. Remote lacunar infarct in the right thalamus and small remote ischemic focus in the left corona radiata. Low-attenuation along the lower right sylvian fissure is most consistent with a dilated perivascular space.  Since this is a code stroke, page to Dr. Nicole Kindred placed on 09/23/2014 at 10:04 am.  IMPRESSION: 1. No acute intracranial findings. 2. Chronic small vessel disease with remote right thalamus lacunar infarct.   Electronically Signed   By: Monte Fantasia M.D.   On: 09/23/2014 10:06   Ct Angio Neck W/cm &/or Wo/cm  09/24/2014   CLINICAL DATA:  Initial evaluation for carotid stenosis.  EXAM: CT ANGIOGRAPHY NECK  TECHNIQUE: Multidetector CT imaging of the neck was performed using the standard protocol during bolus administration of intravenous contrast. Multiplanar CT image reconstructions and MIPs were obtained to evaluate the vascular anatomy. Carotid stenosis measurements (when applicable) are obtained utilizing NASCET criteria, using the distal internal carotid diameter as the denominator.  CONTRAST:  38mL OMNIPAQUE IOHEXOL 350 MG/ML SOLN  COMPARISON:  Prior intracranial MRA from earlier on the same day.  FINDINGS: Aortic arch: The visualized aortic arch is of normal caliber are with normal 3 vessel morphology. Scattered calcified plaque present within the partially visualized arch in at the origin of the great vessels. No high-grade stenosis seen at the origin of the great vessels. Visualized subclavian arteries widely patent.  Right carotid system: Right common carotid artery bottle opacified to the level of the carotid bifurcation. There are is heavy calcified atheromatous plaque seen about the right carotid bifurcation extending into the proximal right ICA. There is associated stenosis of approximately 70-80% by NASCET criteria. The area of involvement extends from the carotid bifurcation and measures approximately 6 mm in length. There is additional coarse calcification slightly more distally  within the right ICA without significant stenosis. Distally, the right ICA is well opacified to the skullbase. Prominent arterial calcifications present within the cavernous right ICA.  Left carotid system: Scattered calcified plaque present at the origin of the left common carotid artery without significant stenosis. Left common carotid artery well opacified to the carotid bifurcation. There is extensive coarse atheromatous calcified plaque at the left carotid bifurcation extending into the proximal left ICA. There is associated stenosis of up to 70-80% by NASCET criteria. The area of involvement extends from the carotid bifurcation in measures approximately 12 mm in length. Distally, the left ICA is well opacified to the skullbase. Scattered atheromatous plaque present within the cavernous left ICA.  Vertebral arteries:Both vertebral artery arise from the subclavian arteries. Minimal atheromatous plaque present at the origin of the vertebral arteries bilaterally, slightly greater on the right. No significant stenosis appreciated. Vertebral arteries well a opacified to the level of the skullbase without evidence of occlusion or high-grade stenosis. The intracranial right vertebral artery is fairly diminutive with a small hypoplastic and stenotic branch extending to the  vertebrobasilar junction beyond the takeoff of the right posterior inferior cerebral artery. This is similar as seen on prior MRA.  Skeleton: Moderate to advanced multilevel degenerative disc disease present within the visualized spine as evidenced by intervertebral disc space narrowing, endplate sclerosis, and osteophytosis. Changes are most severe at the C4-5 through C7-T1 levels. No acute osseous abnormality. No worrisome lytic or blastic osseous lesion.  Other neck: No acute soft tissue abnormality identified within the neck. No adenopathy. Thyroid gland within normal limits. Visualized superior mediastinum within normal limits. Visualized lung  apices are clear.  IMPRESSION: 1. Heavy atheromatous plaque about the carotid bifurcations extending into the proximal ICAs bilaterally. There is associated stenoses of approximately 70-80% by NASCET criteria bilaterally, slightly worse on the left. The area of involvement extends from the carotid bifurcations bilaterally, and extends approximately 6 mm in length on the right, and 12 mm in length on the left. 2. Widely patent vertebral arteries bilaterally. The distal intracranial aspect of the right vertebral artery is markedly diminutive and stenotic as compared to the left. This is similar as seen on prior intracranial MRA.   Electronically Signed   By: Jeannine Boga M.D.   On: 09/24/2014 00:52   Mr Brain Wo Contrast  09/23/2014   CLINICAL DATA:  59 year old male with slurred speech and left facial numbness. Person history of previous lacunar infarcts. Initial encounter.  EXAM: MRI HEAD WITHOUT CONTRAST  MRA HEAD WITHOUT CONTRAST  TECHNIQUE: Multiplanar, multiecho pulse sequences of the brain and surrounding structures were obtained without intravenous contrast. Angiographic images of the head were obtained using MRA technique without contrast.  COMPARISON:  Head CT without contrast 0957 hr today. Brain MRI and MRA 02/28/2014.  FINDINGS: MRI HEAD FINDINGS  Stable cerebral volume since 2015. Major intracranial vascular flow voids are stable. No restricted diffusion to suggest acute infarction. No midline shift, mass effect, evidence of mass lesion, ventriculomegaly, extra-axial collection or acute intracranial hemorrhage. Cervicomedullary junction and pituitary are within normal limits. Negative visualized cervical spine. Stable gray and white matter signal throughout the brain.  Visible internal auditory structures appear normal. Stable mastoids and paranasal sinuses. Stable orbits soft tissues. Visualized scalp soft tissues are within normal limits. Stable bone marrow signal.  MRA HEAD FINDINGS   Chronically decreased antegrade flow signal in the distal right vertebral artery which appears atherosclerotic and stenotic. The right PICA remains patent. Stable antegrade flow signal in the distal left vertebral artery. Left PICA is patent. Stable basilar artery without stenosis. SCA and PCA origins are stable and within normal limits. Bilateral PCA branches are stable with mild left P2 segment stenosis, preserved distal PCA flow. Stable posterior communicating arteries.  Stable antegrade flow in both ICA siphons with moderate left greater than right siphon irregularity corresponding to calcified atherosclerotic plaque on the earlier CT. Up to mild siphon stenosis on the left. Ophthalmic artery origins are within normal limits. Carotid termini remain patent.  Dominant right ACA A1 segment again noted. Anterior communicating artery and visualized bilateral ACA branches are stable and within normal limits. Visualized left MCA branches are stable and within normal limits.  IMPRESSION: 1. No acute intracranial abnormality. Stable non contrast MRI appearance of the brain since 2015. 2. Stable intracranial MRA; chronically diseased and stenotic distal right vertebral artery, and left greater than right carotid siphon atherosclerosis.   Electronically Signed   By: Genevie Ann M.D.   On: 09/23/2014 15:29   Mr Jodene Nam Head/brain Wo Cm  09/23/2014   CLINICAL DATA:  59 year old male with slurred speech and left facial numbness. Person history of previous lacunar infarcts. Initial encounter.  EXAM: MRI HEAD WITHOUT CONTRAST  MRA HEAD WITHOUT CONTRAST  TECHNIQUE: Multiplanar, multiecho pulse sequences of the brain and surrounding structures were obtained without intravenous contrast. Angiographic images of the head were obtained using MRA technique without contrast.  COMPARISON:  Head CT without contrast 0957 hr today. Brain MRI and MRA 02/28/2014.  FINDINGS: MRI HEAD FINDINGS  Stable cerebral volume since 2015. Major intracranial  vascular flow voids are stable. No restricted diffusion to suggest acute infarction. No midline shift, mass effect, evidence of mass lesion, ventriculomegaly, extra-axial collection or acute intracranial hemorrhage. Cervicomedullary junction and pituitary are within normal limits. Negative visualized cervical spine. Stable gray and white matter signal throughout the brain.  Visible internal auditory structures appear normal. Stable mastoids and paranasal sinuses. Stable orbits soft tissues. Visualized scalp soft tissues are within normal limits. Stable bone marrow signal.  MRA HEAD FINDINGS  Chronically decreased antegrade flow signal in the distal right vertebral artery which appears atherosclerotic and stenotic. The right PICA remains patent. Stable antegrade flow signal in the distal left vertebral artery. Left PICA is patent. Stable basilar artery without stenosis. SCA and PCA origins are stable and within normal limits. Bilateral PCA branches are stable with mild left P2 segment stenosis, preserved distal PCA flow. Stable posterior communicating arteries.  Stable antegrade flow in both ICA siphons with moderate left greater than right siphon irregularity corresponding to calcified atherosclerotic plaque on the earlier CT. Up to mild siphon stenosis on the left. Ophthalmic artery origins are within normal limits. Carotid termini remain patent.  Dominant right ACA A1 segment again noted. Anterior communicating artery and visualized bilateral ACA branches are stable and within normal limits. Visualized left MCA branches are stable and within normal limits.  IMPRESSION: 1. No acute intracranial abnormality. Stable non contrast MRI appearance of the brain since 2015. 2. Stable intracranial MRA; chronically diseased and stenotic distal right vertebral artery, and left greater than right carotid siphon atherosclerosis.   Electronically Signed   By: Genevie Ann M.D.   On: 09/23/2014 15:29    Scheduled Meds: .  cilostazol  100 mg Oral BID  . insulin aspart  0-15 Units Subcutaneous TID WC  . insulin aspart  0-5 Units Subcutaneous QHS  . insulin glargine  12 Units Subcutaneous QHS  . levothyroxine  88 mcg Oral QAC breakfast  . nicotine  21 mg Transdermal Daily  . nortriptyline  10 mg Oral QHS  . pantoprazole  40 mg Oral BID  . [START ON 09/25/2014] pneumococcal 23 valent vaccine  0.5 mL Intramuscular Tomorrow-1000  . 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     Niel Hummer A  [facility] Pager [phone]. If 7PM-7AM, please contact night-coverage at [email or web address], password [facility] 09/24/2014, 4:21 PM  LOS: 1 day

## 2014-09-24 NOTE — H&P (View-Only) (Signed)
[facility] CON[facility] Reason for consult: marked anemia Referring Physician: [facility] PCP: [name]. Primary G.I.: none patient unassigned  Brad TOOKER is an 59 y.o. male.  HPI: he has a history of carotid artery stenosis and previous small lacuna infarcts. He has type II diabetes and hypertension as well as along smoking history area he presented to the emergency room with neurological symptoms however CT and MRI did not show any acute events and showed only old infarcts. He was found to be profoundly anemic with a hemoglobin of 7. He denies any hematemesis, epigastric discomfort or melena. He denies hematochezia. He does admit to taking a lot of BC powders for chronic headaches which she is attributed to his hypertension and apparently had melenic stools in the past. He is not on any acid reducing medications and is on chronic antiplatelet agents as well as ibuprofen with frequent BC useas well. He has never had EGD or colonoscopy  Past Medical History  Diagnosis Date  . Depression   . GERD (gastroesophageal reflux disease)   . Diabetes mellitus type II   . COPD (chronic obstructive pulmonary disease)   . Ventral hernia   . Chronic LBP   . Hyperlipidemia 08/27/2014  . Hypothyroidism 08/27/2014  . Carotid stenosis 08/27/2014    Past Surgical History  Procedure Laterality Date  . Neg stress test  2000  . Rotator cuff repair  2009    multiple f/u Sxs/infection Tx  . Incision and drainage perirectal abscess  03/2009    Family History  Problem Relation Age of Onset  . Alcohol abuse Father   . Lung cancer Father   . Heart disease Mother     automated implantable cardioverter-defibrillator   . Diabetes type II Mother   . Lung cancer Maternal Grandmother   . Lung cancer Sister     Social History:  reports that he has been smoking Cigarettes.  He has a 60 pack-year smoking history. He has never used smokeless tobacco. He reports that he drinks alcohol. He reports  that he does not use illicit drugs.  Allergies:  Allergies  Allergen Reactions  . Doxycycline Hives and Itching  . Lipitor [Atorvastatin] Other (See Comments)    Myalgia   . Oxycodone Itching    Medications; Prior to Admission medications   Medication Sig Start Date End Date Taking? Authorizing Provider  albuterol (PROVENTIL HFA;VENTOLIN HFA) 108 (90 BASE) MCG/ACT inhaler Inhale 2 puffs into the lungs every 6 (six) hours as needed for wheezing or shortness of breath. 10/10/13  Yes Biagio Borg, MD  aspirin EC 81 MG tablet Take 1 tablet (81 mg total) by mouth daily. 03/01/14  Yes Reyne Dumas, MD  Blood Pressure Monitor DEVI Use to check Blood pressure daily 05/25/14  Yes Biagio Borg, MD  cilostazol (PLETAL) 100 MG tablet TAKE 1 TABLET BY MOUTH TWICE DAILY 05/25/14  Yes Serafina Mitchell, MD  furosemide (LASIX) 40 MG tablet 1 tab by mouth in the AM, with a second dose in PM as needed only for swelling 08/27/14  Yes Biagio Borg, MD  glipiZIDE (GLIPIZIDE XL) 2.5 MG 24 hr tablet Take 1 tablet (2.5 mg total) by mouth daily with breakfast. As needed for blood sugar > 200 03/19/14  Yes Biagio Borg, MD  glucosamine-chondroitin 500-400 MG tablet Take 2 tablets by mouth daily.   Yes Historical Provider, MD  glucose blood test strip 1 each by Other route See admin instructions. Check blood sugar once daily.  Yes Historical Provider, MD  HYDROcodone-homatropine (HYCODAN) 5-1.5 MG/5ML syrup Take 5 mLs by mouth every 6 (six) hours as needed. Patient not taking: Reported on 09/23/2014 06/18/14   Biagio Borg, MD  ibuprofen (ADVIL,MOTRIN) 200 MG tablet Take 800 mg by mouth every 6 (six) hours as needed for mild pain or moderate pain.   Yes Historical Provider, MD  Lancets MISC 1 each by Other route See admin instructions. Check blood sugar once daily.   Yes Historical Provider, MD  levofloxacin (LEVAQUIN) 250 MG tablet Take 1 tablet (250 mg total) by mouth daily. Patient not taking: Reported on 09/23/2014  06/18/14   Biagio Borg, MD  levothyroxine (SYNTHROID, LEVOTHROID) 88 MCG tablet Take 1 tablet (88 mcg total) by mouth daily. 08/27/14  Yes Biagio Borg, MD  lisinopril (PRINIVIL,ZESTRIL) 20 MG tablet Take 1 tablet (20 mg total) by mouth daily. 05/25/14  Yes Rosalin Hawking, MD  metFORMIN (GLUCOPHAGE) 500 MG tablet Take 1,000 mg by mouth daily.   Yes Historical Provider, MD  nortriptyline (PAMELOR) 10 MG capsule Take 1 capsule (10 mg total) by mouth at bedtime. Patient not taking: Reported on 09/23/2014 10/10/13   Biagio Borg, MD  simvastatin (ZOCOR) 40 MG tablet Take 1 tablet (40 mg total) by mouth at bedtime. 08/27/14  Yes Biagio Borg, MD  tiotropium (SPIRIVA) 18 MCG inhalation capsule Place 18 mcg into inhaler and inhale 2 (two) times daily as needed (for wheezing).   Yes Historical Provider, MD  tiZANidine (ZANAFLEX) 4 MG tablet Take 8 mg by mouth every 6 (six) hours as needed for muscle spasms.   Yes Historical Provider, MD  traMADol (ULTRAM) 50 MG tablet 1-2 tabs by mouth up to four times per day 08/27/14  Yes Biagio Borg, MD   .  stroke: mapping our early stages of recovery book   Does not apply Once  . sodium chloride   Intravenous Once  . aspirin  300 mg Rectal Daily   Or  . aspirin  325 mg Oral Daily  . cilostazol  100 mg Oral BID  . insulin aspart  0-15 Units Subcutaneous TID WC  . insulin aspart  0-5 Units Subcutaneous QHS  . insulin glargine  12 Units Subcutaneous QHS  . [START ON 09/24/2014] levothyroxine  88 mcg Oral QAC breakfast  . nicotine  21 mg Transdermal Daily  . nortriptyline  10 mg Oral QHS  . pantoprazole (PROTONIX) IV  40 mg Intravenous Q12H  . [START ON 09/24/2014] pneumococcal 23 valent vaccine  0.5 mL Intramuscular Tomorrow-1000  . simvastatin  40 mg Oral QHS   PRN Meds acetaminophen **OR** acetaminophen, HYDROcodone-acetaminophen, labetalol, senna-docusate, tiZANidine Results for orders placed or performed during the hospital encounter of 09/23/14 (from the past 48  hour(s))  Ethanol     Status: None   Collection Time: 09/23/14  9:50 AM  Result Value Ref Range   Alcohol, Ethyl (B) <5 0 - 9 mg/dL    Comment:        LOWEST DETECTABLE LIMIT FOR SERUM ALCOHOL IS 11 mg/dL FOR MEDICAL PURPOSES ONLY   Protime-INR     Status: None   Collection Time: 09/23/14  9:50 AM  Result Value Ref Range   Prothrombin Time 13.9 11.6 - 15.2 seconds   INR 1.05 0.00 - 1.49  APTT     Status: None   Collection Time: 09/23/14  9:50 AM  Result Value Ref Range   aPTT 30 24 - 37 seconds  CBC  Status: Abnormal   Collection Time: 09/23/14  9:50 AM  Result Value Ref Range   WBC 7.9 4.0 - 10.5 K/uL   RBC 3.88 (L) 4.22 - 5.81 MIL/uL   Hemoglobin 7.0 (L) 13.0 - 17.0 g/dL   HCT 25.6 (L) 39.0 - 52.0 %   MCV 66.0 (L) 78.0 - 100.0 fL   MCH 18.0 (L) 26.0 - 34.0 pg   MCHC 27.3 (L) 30.0 - 36.0 g/dL   RDW 18.1 (H) 11.5 - 15.5 %   Platelets 261 150 - 400 K/uL  Differential     Status: None   Collection Time: 09/23/14  9:50 AM  Result Value Ref Range   Neutrophils Relative % 68 43 - 77 %   Lymphocytes Relative 18 12 - 46 %   Monocytes Relative 10 3 - 12 %   Eosinophils Relative 3 0 - 5 %   Basophils Relative 1 0 - 1 %   Neutro Abs 5.4 1.7 - 7.7 K/uL   Lymphs Abs 1.4 0.7 - 4.0 K/uL   Monocytes Absolute 0.8 0.1 - 1.0 K/uL   Eosinophils Absolute 0.2 0.0 - 0.7 K/uL   Basophils Absolute 0.1 0.0 - 0.1 K/uL   RBC Morphology ELLIPTOCYTES     Comment: POLYCHROMASIA PRESENT  Comprehensive metabolic panel     Status: Abnormal   Collection Time: 09/23/14  9:50 AM  Result Value Ref Range   Sodium 132 (L) 135 - 145 mmol/L   Potassium 3.9 3.5 - 5.1 mmol/L   Chloride 96 96 - 112 mmol/L   CO2 29 19 - 32 mmol/L   Glucose, Bld 342 (H) 70 - 99 mg/dL   BUN 15 6 - 23 mg/dL   Creatinine, Ser 0.99 0.50 - 1.35 mg/dL   Calcium 8.3 (L) 8.4 - 10.5 mg/dL   Total Protein 6.2 6.0 - 8.3 g/dL   Albumin 2.5 (L) 3.5 - 5.2 g/dL   AST 38 (H) 0 - 37 U/L   ALT 30 0 - 53 U/L   Alkaline Phosphatase  138 (H) 39 - 117 U/L   Total Bilirubin 0.5 0.3 - 1.2 mg/dL   GFR calc non Af Amer 90 (L) >90 mL/min   GFR calc Af Amer >90 >90 mL/min    Comment: (NOTE) The eGFR has been calculated using the CKD EPI equation. This calculation has not been validated in all clinical situations. eGFR's persistently <90 mL/min signify possible Chronic Kidney Disease.    Anion gap 7 5 - 15  I-Stat Troponin, ED (not at [facility])     Status: None   Collection Time: 09/23/14  9:54 AM  Result Value Ref Range   Troponin i, poc 0.01 0.00 - 0.08 ng/mL   Comment 3            Comment: Due to the release kinetics of cTnI, a negative result within the first hours of the onset of symptoms does not rule out myocardial infarction with certainty. If myocardial infarction is still suspected, repeat the test at appropriate intervals.   I-Stat Chem 8, ED     Status: Abnormal   Collection Time: 09/23/14  9:56 AM  Result Value Ref Range   Sodium 133 (L) 135 - 145 mmol/L   Potassium 4.1 3.5 - 5.1 mmol/L   Chloride 94 (L) 96 - 112 mmol/L   BUN 17 6 - 23 mg/dL   Creatinine, Ser 1.00 0.50 - 1.35 mg/dL   Glucose, Bld 349 (H) 70 - 99 mg/dL   Calcium,  Ion 1.07 (L) 1.12 - 1.23 mmol/L   TCO2 23 0 - 100 mmol/L   Hemoglobin 9.2 (L) 13.0 - 17.0 g/dL   HCT 27.0 (L) 39.0 - 52.0 %  Urine Drug Screen     Status: None   Collection Time: 09/23/14 10:18 AM  Result Value Ref Range   Opiates NONE DETECTED NONE DETECTED   Cocaine NONE DETECTED NONE DETECTED   Benzodiazepines NONE DETECTED NONE DETECTED   Amphetamines NONE DETECTED NONE DETECTED   Tetrahydrocannabinol NONE DETECTED NONE DETECTED   Barbiturates NONE DETECTED NONE DETECTED    Comment:        DRUG SCREEN FOR MEDICAL PURPOSES ONLY.  IF CONFIRMATION IS NEEDED FOR ANY PURPOSE, NOTIFY LAB WITHIN 5 DAYS.        LOWEST DETECTABLE LIMITS FOR URINE DRUG SCREEN Drug Class       Cutoff (ng/mL) Amphetamine      1000 Barbiturate      200 Benzodiazepine   182 Tricyclics        993 Opiates          300 Cocaine          300 THC              50   Urinalysis, Routine w reflex microscopic     Status: Abnormal   Collection Time: 09/23/14 10:18 AM  Result Value Ref Range   Color, Urine YELLOW YELLOW   APPearance CLEAR CLEAR   Specific Gravity, Urine 1.022 1.005 - 1.030   pH 5.5 5.0 - 8.0   Glucose, UA 500 (A) NEGATIVE mg/dL   Hgb urine dipstick NEGATIVE NEGATIVE   Bilirubin Urine NEGATIVE NEGATIVE   Ketones, ur NEGATIVE NEGATIVE mg/dL   Protein, ur >300 (A) NEGATIVE mg/dL   Urobilinogen, UA 0.2 0.0 - 1.0 mg/dL   Nitrite NEGATIVE NEGATIVE   Leukocytes, UA NEGATIVE NEGATIVE  Urine microscopic-add on     Status: Abnormal   Collection Time: 09/23/14 10:18 AM  Result Value Ref Range   Squamous Epithelial / LPF FEW (A) RARE   WBC, UA 3-6 <3 WBC/hpf   RBC / HPF 0-2 <3 RBC/hpf   Bacteria, UA FEW (A) RARE   Casts GRANULAR CAST (A) NEGATIVE    Comment: HYALINE CASTS  CBC     Status: Abnormal   Collection Time: 09/23/14 11:15 AM  Result Value Ref Range   WBC 6.5 4.0 - 10.5 K/uL   RBC 3.57 (L) 4.22 - 5.81 MIL/uL   Hemoglobin 6.6 (LL) 13.0 - 17.0 g/dL    Comment: REPEATED TO VERIFY CRITICAL RESULT CALLED TO, READ BACK BY AND VERIFIED WITH: Patrecia Pace RN 1215 09/23/2014 BY MACEDA, J    HCT 23.5 (L) 39.0 - 52.0 %   MCV 65.8 (L) 78.0 - 100.0 fL   MCH 18.5 (L) 26.0 - 34.0 pg   MCHC 28.1 (L) 30.0 - 36.0 g/dL   RDW 18.0 (H) 11.5 - 15.5 %   Platelets 223 150 - 400 K/uL  Sample to Blood Bank     Status: None   Collection Time: 09/23/14 11:15 AM  Result Value Ref Range   Blood Bank Specimen SAMPLE AVAILABLE FOR TESTING    Sample Expiration 09/24/2014   Type and screen     Status: None (Preliminary result)   Collection Time: 09/23/14 11:15 AM  Result Value Ref Range   ABO/RH(D) B POS    Antibody Screen NEG    Sample Expiration 09/26/2014    Unit Number [id number]  Blood Component Type RED CELLS,LR    Unit division 00    Status of Unit ALLOCATED     Transfusion Status OK TO TRANSFUSE    Crossmatch Result Compatible    Unit Number [id number]    Blood Component Type RED CELLS,LR    Unit division 00    Status of Unit ISSUED    Transfusion Status OK TO TRANSFUSE    Crossmatch Result Compatible   Prepare RBC     Status: None   Collection Time: 09/23/14 11:15 AM  Result Value Ref Range   Order Confirmation ORDER PROCESSED BY BLOOD BANK     Ct Head Wo Contrast  09/23/2014   CLINICAL DATA:  Headache, dizziness, weakness. Visual change in the left eye with left-sided headache. Code stroke.  EXAM: CT HEAD WITHOUT CONTRAST  TECHNIQUE: Contiguous axial images were obtained from the base of the skull through the vertex without intravenous contrast.  COMPARISON:  02/28/2014  FINDINGS: Skull and Sinuses:Negative for fracture or destructive process. The mastoids, middle ears, and imaged paranasal sinuses are clear.  Orbits: No acute abnormality.  Brain: No evidence of acute infarction, hemorrhage, hydrocephalus, or mass lesion/mass effect. Remote lacunar infarct in the right thalamus and small remote ischemic focus in the left corona radiata. Low-attenuation along the lower right sylvian fissure is most consistent with a dilated perivascular space.  Since this is a code stroke, page to Dr. Nicole Kindred placed on 09/23/2014 at 10:04 am.  IMPRESSION: 1. No acute intracranial findings. 2. Chronic small vessel disease with remote right thalamus lacunar infarct.   Electronically Signed   By: Monte Fantasia M.D.   On: 09/23/2014 10:06   Mr Brain Wo Contrast  09/23/2014   CLINICAL DATA:  59 year old male with slurred speech and left facial numbness. Person history of previous lacunar infarcts. Initial encounter.  EXAM: MRI HEAD WITHOUT CONTRAST  MRA HEAD WITHOUT CONTRAST  TECHNIQUE: Multiplanar, multiecho pulse sequences of the brain and surrounding structures were obtained without intravenous contrast. Angiographic images of the head were obtained using MRA technique  without contrast.  COMPARISON:  Head CT without contrast 0957 hr today. Brain MRI and MRA 02/28/2014.  FINDINGS: MRI HEAD FINDINGS  Stable cerebral volume since 2015. Major intracranial vascular flow voids are stable. No restricted diffusion to suggest acute infarction. No midline shift, mass effect, evidence of mass lesion, ventriculomegaly, extra-axial collection or acute intracranial hemorrhage. Cervicomedullary junction and pituitary are within normal limits. Negative visualized cervical spine. Stable gray and white matter signal throughout the brain.  Visible internal auditory structures appear normal. Stable mastoids and paranasal sinuses. Stable orbits soft tissues. Visualized scalp soft tissues are within normal limits. Stable bone marrow signal.  MRA HEAD FINDINGS  Chronically decreased antegrade flow signal in the distal right vertebral artery which appears atherosclerotic and stenotic. The right PICA remains patent. Stable antegrade flow signal in the distal left vertebral artery. Left PICA is patent. Stable basilar artery without stenosis. SCA and PCA origins are stable and within normal limits. Bilateral PCA branches are stable with mild left P2 segment stenosis, preserved distal PCA flow. Stable posterior communicating arteries.  Stable antegrade flow in both ICA siphons with moderate left greater than right siphon irregularity corresponding to calcified atherosclerotic plaque on the earlier CT. Up to mild siphon stenosis on the left. Ophthalmic artery origins are within normal limits. Carotid termini remain patent.  Dominant right ACA A1 segment again noted. Anterior communicating artery and visualized bilateral ACA branches are stable and within normal limits.  Visualized left MCA branches are stable and within normal limits.  IMPRESSION: 1. No acute intracranial abnormality. Stable non contrast MRI appearance of the brain since 2015. 2. Stable intracranial MRA; chronically diseased and stenotic  distal right vertebral artery, and left greater than right carotid siphon atherosclerosis.   Electronically Signed   By: Genevie Ann M.D.   On: 09/23/2014 15:29   Mr Jodene Nam Head/brain Wo Cm  09/23/2014   CLINICAL DATA:  59 year old male with slurred speech and left facial numbness. Person history of previous lacunar infarcts. Initial encounter.  EXAM: MRI HEAD WITHOUT CONTRAST  MRA HEAD WITHOUT CONTRAST  TECHNIQUE: Multiplanar, multiecho pulse sequences of the brain and surrounding structures were obtained without intravenous contrast. Angiographic images of the head were obtained using MRA technique without contrast.  COMPARISON:  Head CT without contrast 0957 hr today. Brain MRI and MRA 02/28/2014.  FINDINGS: MRI HEAD FINDINGS  Stable cerebral volume since 2015. Major intracranial vascular flow voids are stable. No restricted diffusion to suggest acute infarction. No midline shift, mass effect, evidence of mass lesion, ventriculomegaly, extra-axial collection or acute intracranial hemorrhage. Cervicomedullary junction and pituitary are within normal limits. Negative visualized cervical spine. Stable gray and white matter signal throughout the brain.  Visible internal auditory structures appear normal. Stable mastoids and paranasal sinuses. Stable orbits soft tissues. Visualized scalp soft tissues are within normal limits. Stable bone marrow signal.  MRA HEAD FINDINGS  Chronically decreased antegrade flow signal in the distal right vertebral artery which appears atherosclerotic and stenotic. The right PICA remains patent. Stable antegrade flow signal in the distal left vertebral artery. Left PICA is patent. Stable basilar artery without stenosis. SCA and PCA origins are stable and within normal limits. Bilateral PCA branches are stable with mild left P2 segment stenosis, preserved distal PCA flow. Stable posterior communicating arteries.  Stable antegrade flow in both ICA siphons with moderate left greater than right  siphon irregularity corresponding to calcified atherosclerotic plaque on the earlier CT. Up to mild siphon stenosis on the left. Ophthalmic artery origins are within normal limits. Carotid termini remain patent.  Dominant right ACA A1 segment again noted. Anterior communicating artery and visualized bilateral ACA branches are stable and within normal limits. Visualized left MCA branches are stable and within normal limits.  IMPRESSION: 1. No acute intracranial abnormality. Stable non contrast MRI appearance of the brain since 2015. 2. Stable intracranial MRA; chronically diseased and stenotic distal right vertebral artery, and left greater than right carotid siphon atherosclerosis.   Electronically Signed   By: Genevie Ann M.D.   On: 09/23/2014 15:29               Blood pressure 159/78, pulse 91, temperature 97.5 F (36.4 C), temperature source Oral, resp. rate 20, height _0  (1.803 m), weight 122.471 kg (270 lb), SpO2 94 %.  Physical exam:   General--obese white male alert and oriented in no distress. No obvious neurological deficits  ENT--mucous membranes normal sclera nonicteric  Neck--obese neck no lymphadenopathy  Heart--regular rate and rhythm without murmurs are gallops  Lungs--clear  Abdomen--obese soft and nontender     Assessment: 1. Marked anemia. Patient is not currently taking any acid reducing medications and has been taking ibuprofen as well as a large amount of BC powders in the face of antiplatelet agents. It's very likely he has NSAID induced ulcer. 2. Cerebrovascular disease with history of previous strokes. CT and MRI negative for acute stroke. Neurological symptoms have resolved. 3. Type II diabetes  4. Hypertension 5. Obesity  Plan: will plan on EGD tomorrow. Scheduled about 12 noon. We'll keep him NPO after it midnight. Would go ahead and keep them on PPI therapy.   Cortasia Screws JR,Johnnay Pleitez L 09/23/2014, 4:21 PM

## 2014-09-25 ENCOUNTER — Ambulatory Visit: Payer: Medicare Other | Admitting: Internal Medicine

## 2014-09-25 ENCOUNTER — Encounter (HOSPITAL_COMMUNITY): Payer: Self-pay | Admitting: Gastroenterology

## 2014-09-25 DIAGNOSIS — G459 Transient cerebral ischemic attack, unspecified: Secondary | ICD-10-CM | POA: Diagnosis not present

## 2014-09-25 LAB — BASIC METABOLIC PANEL
Anion gap: 7 (ref 5–15)
BUN: 15 mg/dL (ref 6–23)
CO2: 27 mmol/L (ref 19–32)
Calcium: 8.6 mg/dL (ref 8.4–10.5)
Chloride: 101 mmol/L (ref 96–112)
Creatinine, Ser: 0.9 mg/dL (ref 0.50–1.35)
GFR calc Af Amer: >90 mL/min (ref >90)
GFR calc non Af Amer: >90 mL/min (ref >90)
Glucose, Bld: 221 mg/dL — ABNORMAL HIGH (ref 70–99)
Potassium: 4.6 mmol/L (ref 3.5–5.1)
Sodium: 135 mmol/L (ref 135–145)

## 2014-09-25 LAB — CBC
HCT: 29.7 % — ABNORMAL LOW (ref 39.0–52.0)
Hemoglobin: 8.6 g/dL — ABNORMAL LOW (ref 13.0–17.0)
MCH: 19.4 pg — ABNORMAL LOW (ref 26.0–34.0)
MCHC: 29 g/dL — ABNORMAL LOW (ref 30.0–36.0)
MCV: 66.9 fL — ABNORMAL LOW (ref 78.0–100.0)
Platelets: 237 10*3/uL (ref 150–400)
RBC: 4.44 MIL/uL (ref 4.22–5.81)
RDW: 18.7 % — ABNORMAL HIGH (ref 11.5–15.5)
WBC: 6.1 10*3/uL (ref 4.0–10.5)

## 2014-09-25 LAB — HEMOGLOBIN A1C
Hgb A1c MFr Bld: 6.9 % — ABNORMAL HIGH (ref 4.8–5.6)
Mean Plasma Glucose: 151 mg/dL

## 2014-09-25 LAB — GLUCOSE, CAPILLARY
Glucose-Capillary: 195 mg/dL — ABNORMAL HIGH (ref 70–99)
Glucose-Capillary: 167 mg/dL — ABNORMAL HIGH (ref 70–99)

## 2014-09-25 MED ORDER — PANTOPRAZOLE SODIUM 40 MG PO TBEC: 40.0000 mg | DELAYED_RELEASE_TABLET | Freq: Two times a day (BID) | ORAL | Status: DC

## 2014-09-25 MED ORDER — GLIPIZIDE ER 2.5 MG PO TB24: 2.5000 mg | ORAL_TABLET | Freq: Every day | ORAL | Status: DC

## 2014-09-25 MED ORDER — LISINOPRIL 20 MG PO TABS
20.0000 mg | ORAL_TABLET | Freq: Every day | ORAL | Status: DC
  Administered 2014-09-25: 20 mg via ORAL
  Filled 2014-09-25: qty 1

## 2014-09-25 MED ORDER — FERROUS SULFATE 325 (65 FE) MG PO TABS: 325.0000 mg | ORAL_TABLET | Freq: Three times a day (TID) | ORAL | Status: AC

## 2014-09-25 MED ORDER — ASPIRIN EC 325 MG PO TBEC: 325.0000 mg | DELAYED_RELEASE_TABLET | Freq: Every day | ORAL | Status: DC

## 2014-09-25 MED ORDER — METFORMIN HCL 500 MG PO TABS: 1000.0000 mg | ORAL_TABLET | Freq: Every day | ORAL | Status: DC

## 2014-09-25 MED ORDER — NICOTINE 21 MG/24HR TD PT24: 21.0000 mg | MEDICATED_PATCH | Freq: Every day | TRANSDERMAL | Status: DC

## 2014-09-25 NOTE — Progress Notes (Addendum)
    Subjective  -   Patient reports no neurologic events overnight.  He wants to go home.   Physical Exam:  Neurologically intact. Respirations nonlabored Cardiovascular: Regular in rhythm       Assessment/Plan:    I have reviewed his CT scans which showed greater than 70% bilateral carotid stenosis.  His symptoms correlate with left sided carotid disease.  I discussed proceeding electively with left carotid endarterectomy.  I would like for him to be off of his Trental.  He will also need formal clearance from GI, as he will require systemic heparinization for carotid endarterectomy.  He needs to continue on aspirin.  I am okay with discharge and scheduling carotid endarterectomy, likely next week assuming there had been no further issues with GI bleeding.  I discussed the details of carotid endarterectomy, including the risks and benefits which include but are not limited to the risk of cardiac point complications, bleeding, the risk of stroke as well as nerve injury.  The patient understands all this and wants to proceed.  Christyne Mccain IV, V. WELLS 09/25/2014 9:07 AM --  Filed Vitals:   09/25/14 0837  BP: 172/76  Pulse: 82  Temp: 98.2 F (36.8 C)  Resp: 20    Intake/Output Summary (Last 24 hours) at 09/25/14 0907 Last data filed at 09/25/14 2334  Gross per 24 hour  Intake    880 ml  Output      0 ml  Net    880 ml     Laboratory CBC    Component Value Date/Time   WBC 6.1 09/25/2014 0721   HGB 8.6* 09/25/2014 0721   HCT 29.7* 09/25/2014 0721   PLT 237 09/25/2014 0721    BMET    Component Value Date/Time   NA 135 09/25/2014 0721   K 4.6 09/25/2014 0721   CL 101 09/25/2014 0721   CO2 27 09/25/2014 0721   GLUCOSE 221* 09/25/2014 0721   BUN 15 09/25/2014 0721   CREATININE 0.90 09/25/2014 0721   CALCIUM 8.6 09/25/2014 0721   GFRNONAA >90 09/25/2014 0721   GFRAA >90 09/25/2014 0721    COAG Lab Results  Component Value Date   INR 1.05 09/23/2014   INR  1.03 02/28/2014   No results found for: PTT  Antibiotics Anti-infectives    None       V. Leia Alf, M.D. [facility] Office: [phone] Pager:  [phone]

## 2014-09-25 NOTE — Progress Notes (Addendum)
Inpatient Diabetes Program Recommendations  AACE/ADA: New Consensus Statement on Inpatient Glycemic Control (2013)  Target Ranges:  Prepandial:   less than 140 mg/dL      Peak postprandial:   less than 180 mg/dL (1-2 hours)      Critically ill patients:  140 - 180 mg/dL   Results for Brad [name], Brad [name] (MRN [id number]) as of 09/25/2014 11:06  Ref. Range 09/23/2014 21:39 09/24/2014 06:41 09/24/2014 16:42 09/24/2014 21:07 09/25/2014 06:44  Glucose-Capillary Latest Range: 70-99 mg/dL 261 (H) 214 (H) 178 (H) 200 (H) 195 (H)    Reason for assessment- elevated CBG  Diabetes history: Type 2 Outpatient Diabetes medications: glipizide 2.5mg /day, Metformin 1000mg  qday Current orders for Inpatient glycemic control: Lantus 12 units q day, Novolog 0-15 units tid and hs  CBG remain elevated. Please consider increasing Lantus to 18 units per day.    Patient will need to follow up with family MD on discharge.   Needs ideal blood sugar control for surgery and is encouraged to check blood sugar often when he is home so he can share with MD and determine best approach for optimal blood sugar control.     If it is determined that the patient should go home on insulin, staff will need to educate him on how to use insulin.   Gentry Fitz, RN, BA, MHA, CDE Diabetes Coordinator Inpatient Diabetes Program  [phone] (Team Pager) [phone] [facility] Office) 09/25/2014 11:18 AM

## 2014-09-25 NOTE — Progress Notes (Signed)
OT Cancellation Note  Patient Details Name: QUILLAN WHITTER MRN: [id number] DOB: 06-15-1956   Cancelled Treatment:    Reason Eval/Treat Not Completed: OT screened, no needs identified, will sign off - pt with MRI that was negative for acute infarct and symptoms have resolved per pt.  He feels he is back to baseline.  Will sign off.   Darlina Rumpf [place], OTR/L [phone]  09/25/2014, 11:00 AM

## 2014-09-25 NOTE — Progress Notes (Signed)
STROKE TEAM PROGRESS NOTE   HISTORY Brad [name] is a 59 y.o. male history of previous strokes, diabetes mellitus, hypertension,, carotid stenosis and COPD, is entering with history of acute onset of slurred speech, left-sided weakness including facial droop, as well as visual changes in the left side at 8 AM this morning. He describes vision changes as blurring and spots. His wife indicated that he drags his left leg when he walked. He had previous stroke in July 2015. He has been on aspirin 81 mg per day. He also has peripheral vascular disease and has been on Trental. CT scan of his head showed old right thalamic infarction but no acute changes. NIH stroke score was 1 for residual sensory changes on the left. Other deficits resolved after arriving in the emergency room. He is being admitted for possible recurrent stroke.  LSN: 8 AM on 09/23/2014 tPA Given: No; rapid resolution of deficits   SUBJECTIVE (INTERVAL HISTORY) The patient's wife is present. Once again smoking cessation was discussed. Dr Bridgett Larsson consulted yesterday and Dr. Trula Slade saw the patient today. He will see scheduled the patient for a carotid endarterectomy.  Possible obstructive sleep apnea was also discussed. This can be evaluated at a later date.   OBJECTIVE Temp:  [97.8 F (36.6 C)-98.6 F (37 C)] 98.2 F (36.8 C) (02/19 0837) Pulse Rate:  [66-89] 82 (02/19 0837) Cardiac Rhythm:  [-] Normal sinus rhythm (02/18 2000) Resp:  [14-21] 20 (02/19 0837) BP: (122-224)/(54-105) 172/76 mmHg (02/19 0837) SpO2:  [95 %-100 %] 100 % (02/19 0837)   Recent Labs Lab 09/23/14 2139 09/24/14 0641 09/24/14 1642 09/24/14 2107 09/25/14 0644  GLUCAP 261* 214* 178* 200* 195*    Recent Labs Lab 09/23/14 0950 09/23/14 0956 09/24/14 0811 09/25/14 0721  NA 132* 133* 136 135  K 3.9 4.1 3.9 4.6  CL 96 94* 102 101  CO2 29  --  28 27  GLUCOSE 342* 349* 207* 221*  BUN 15 17 13 15   CREATININE 0.99 1.00 0.83 0.90  CALCIUM 8.3*   --  8.6 8.6    Recent Labs Lab 09/23/14 0950  AST 38*  ALT 30  ALKPHOS 138*  BILITOT 0.5  PROT 6.2  ALBUMIN 2.5*    Recent Labs Lab 09/23/14 0950 09/23/14 0956 09/23/14 1115 09/24/14 0811 09/25/14 0721  WBC 7.9  --  6.5 5.6 6.1  NEUTROABS 5.4  --   --   --   --   HGB 7.0* 9.2* 6.6* 8.5* 8.6*  HCT 25.6* 27.0* 23.5* 29.3* 29.7*  MCV 66.0*  --  65.8* 67.0* 66.9*  PLT 261  --  223 242 237   No results for input(s): CKTOTAL, CKMB, CKMBINDEX, TROPONINI in the last 168 hours.  Recent Labs  09/23/14 0950  LABPROT 13.9  INR 1.05    Recent Labs  09/23/14 1018  COLORURINE YELLOW  LABSPEC 1.022  PHURINE 5.5  GLUCOSEU 500*  HGBUR NEGATIVE  BILIRUBINUR NEGATIVE  KETONESUR NEGATIVE  PROTEINUR >300*  UROBILINOGEN 0.2  NITRITE NEGATIVE  LEUKOCYTESUR NEGATIVE       Component Value Date/Time   CHOL 91 09/24/2014 0811   TRIG 123 09/24/2014 0811   HDL 28* 09/24/2014 0811   CHOLHDL 3.3 09/24/2014 0811   VLDL 25 09/24/2014 0811   LDLCALC 38 09/24/2014 0811   Lab Results  Component Value Date   HGBA1C 6.9* 09/24/2014      Component Value Date/Time   LABOPIA NONE DETECTED 09/23/2014 1018   COCAINSCRNUR NONE DETECTED 09/23/2014  [place] 09/23/2014 1018   AMPHETMU NONE DETECTED 09/23/2014 1018   THCU NONE DETECTED 09/23/2014 1018   LABBARB NONE DETECTED 09/23/2014 1018     Recent Labs Lab 09/23/14 0950  ETH <5   2-D echocardiogram 09/24/2014 Study Conclusions - Left ventricle: The cavity size was normal. There was severe focal basal and moderate concentric hypertrophy. Systolic function was normal. The estimated ejection fraction was in the range of 55% to 60%. Wall motion was normal; there were no regional wall motion abnormalities. Features are consistent with a pseudonormal left ventricular filling pattern, with concomitant abnormal relaxation and increased filling pressure (grade 2 diastolic dysfunction). - Aortic  valve: Moderate thickening and calcification, consistent with sclerosis. - Mitral valve: Calcified annulus. There was trivial regurgitation. - Atrial septum: There was increased thickness of the septum, consistent with lipomatous hypertrophy.  Ct Head Wo Contrast 09/23/2014    1. No acute intracranial findings.  2. Chronic small vessel disease with remote right thalamus lacunar infarct.     Ct Angio Neck W/cm &/or Wo/cm 09/24/2014    1. Heavy atheromatous plaque about the carotid bifurcations extending into the proximal ICAs bilaterally. There is associated stenoses of approximately 70-80% by NASCET criteria bilaterally, slightly worse on the left. The area of involvement extends from the carotid bifurcations bilaterally, and extends approximately 6 mm in length on the right, and 12 mm in length on the left.  2. Widely patent vertebral arteries bilaterally. The distal intracranial aspect of the right vertebral artery is markedly diminutive and stenotic as compared to the left. This is similar as seen on prior intracranial MRA.     Mr Brain Wo Contrast 09/23/2014    1. No acute intracranial abnormality. Stable non contrast MRI appearance of the brain since 2015.  2. Stable intracranial MRA; chronically diseased and stenotic distal right vertebral artery, and left greater than right carotid siphon atherosclerosis.     Mr Jodene Nam Head/brain Wo Cm 09/23/2014    1. No acute intracranial abnormality. Stable non contrast MRI appearance of the brain since 2015.  2. Stable intracranial MRA; chronically diseased and stenotic distal right vertebral artery, and left greater than right carotid siphon atherosclerosis.     CUS - Preliminary findings: Right = 60-79% ICA stenosis. Left = 80-99% ICA stenosis. Stenosis has worsened bilaterally since last study 02/2014 where previously the right was 1-39% range and the left was 60-79% range.  PHYSICAL EXAM  Temp:  [97.8 F (36.6 C)-98.6 F (37 C)] 98.2 F  (36.8 C) (02/19 0837) Pulse Rate:  [66-89] 82 (02/19 0837) Resp:  [14-21] 20 (02/19 0837) BP: (122-224)/(54-105) 172/76 mmHg (02/19 0837) SpO2:  [95 %-100 %] 100 % (02/19 0837)  General - Well nourished, well developed, in no apparent distress.  Ophthalmologic - Sharp disc margins OU.  Cardiovascular - Regular rate and rhythm with no murmur.  Mental Status -  Level of arousal and orientation to time, place, and person were intact. Language including expression, naming, repetition, comprehension was assessed and found intact. Fund of Knowledge was assessed and was intact.  Cranial Nerves II - XII - II - Visual field intact OU. III, IV, VI - Extraocular movements intact. V - Facial sensation intact bilaterally. VII - Facial movement intact bilaterally. VIII - Hearing & vestibular intact bilaterally. X - Palate elevates symmetrically. XI - Chin turning & shoulder shrug intact bilaterally. XII - Tongue protrusion intact.  Motor Strength - The patient's strength was normal in all extremities except  right shoulder limited ROM due to previous surgery.  Bulk was normal and fasciculations were absent.   Motor Tone - Muscle tone was assessed at the neck and appendages and was normal.  Reflexes - The patient's reflexes were normal in all extremities and he had no pathological reflexes.  Sensory - Light touch, temperature/pinprick, vibration and proprioception, and Romberg testing were assessed and were normal.    Coordination - The patient had normal movements in the hands and feet with no ataxia or dysmetria.  Tremor was absent.  Gait and Station - The patient's transfers, posture, gait, station, and turns were observed as normal.   ASSESSMENT/PLAN Brad [name] is a 58 y.o. male with history of  previous strokes, diabetes mellitus, hypertension, carotid stenosis, and COPD presenting with acute onset of slurred speech, left hemiparesis, left facial droop. He did not receive IV  t-PA due to rapid resolution of deficits..   TIA:  Non-dominant right hemisphere TIA possible in the setting of bilateral 70-80% ICA stenosis with heavy calcification.  Resultant resolution of deficits.  MRI  no acute infarct  MRA  not remarkable  CTA of the neck - stenoses of approximately 70-80% by NASCET criteria bilaterally, slightly worse on the left.  Carotid Doppler  RICA 02-72% and LICA 53-66%, progressed from 02/2014.  2D Echo  EF 55-60%. No cardiac source of emboli identified.  LDL 63, at the goal  HgbA1c 6.9, borderline  SCDs for VTE prophylaxis  Heart healthy diet with thin liquids  aspirin 81 mg orally every day and cilostazol prior to admission, on ASA and cilostazol for stroke and PVD prevention.  Currently cilostazol discontinued for perforation of CEA. Recommend to continue aspirin 325.  Ongoing aggressive stroke risk factor management  Therapy recommendations: No follow-up therapies recommended.  Disposition: Discharge today. Follow-up with [name] in the clinic in 1-2 months.  Bilateral carotid artery high-grade stenosis  Also confirmed on carotid Doppler  Dr Bridgett Larsson and Dr. Trula Slade have seen the patient.  CEA to be scheduled.  PVD  On cilostazol  Symptoms of claudication  Cilostazol is discontinued as per instruction from Dr. Trula Slade in preparation for CEA  Recommend aspirin 325 at meantime.  Hypertension  Home meds: Lisinopril 20 mg daily  No BP meds this time  elevated blood pressures today - otherwise stable.   BP goal 130-150 due to high-grade stenosis bilateral ICAs  Avoid hypotension  Hyperlipidemia  Home meds:  Zocor 40 mg daily resumed in hospital  LDL 63, goal < 70  Continue statin at discharge  Diabetes  HgbA1c 6.9, goal < 7.0  Controlled  Glucose fluctuates at home  On Lantus  Insulin sliding scale  Diabetes education  Anemia  EGD yesterday per Dr. Oletta Lamas - 3 small ulcers found in the duodenal bulb  probably related to NSAID use.  Oral proton pump inhibitor recommended - ordered  further follow-up with Dr. Oletta Lamas after discharge.  Aspirin restarted.  H/H - 6.6 / 23.5 yesterday  Tobacco abuse  Current smoker  Smoking cessation counseling provided  Nicotine patch provided  Pt is willing to quit  Other Stroke Risk Factors  ETOH use  Obesity, Body mass index is 37.67 kg/(m^2).   Hx stroke/TIA  Other Active Problems  Other Pertinent History  Claudication symptoms   Outpatient sleep study will be considered  Hospital day # 2  Mikey Bussing PA-C [facility] Pager [phone] 09/25/2014, 9:23 AM  I, the attending vascular neurologist, have personally obtained a  history, examined the patient, evaluated laboratory data, individually viewed imaging studies and agree with radiology interpretations. Together with the NP/PA, we formulated the assessment and plan of care which reflects our mutual decision.  I have made any additions or clarifications directly to the above note and agree with the findings and plan as currently documented.   59 year old male with cosmetic history of hypertension, diabetes, peripheral vessel disease, current smoker, pontine stroke July 2015 was admitted for TIA symptoms of slurry speech, left side weakness and facial droop. Carotid Doppler showed progression of bilateral carotid stenosis, right ICA 60-79% stenosis and left 80-99% stenosis. CTA of the neck showed bilaterally 70-80% stenosis with heavily calcified plaques. Vascular surgery recommended carotid endarterectomy to be done as outpatient. Patient also has anemia, EGD showed duodenal ulcers. Currently patient on dural antiplatelet with aspirin and cilostazol, will discontinue cilostazol for perforation of CEA but continue full dose aspirin at meantime. Patient continues to smoke, smoke cessation counseling again provided. Nicotine Patch provided. Outpatient sleep study will be  considered.  Rosalin Hawking, MD PhD Stroke Neurology 09/25/2014 4:31 PM   To contact Stroke Continuity provider, please refer to [email or web address] After hours, contact General Neurology

## 2014-09-25 NOTE — Progress Notes (Signed)
Discharge instructions reviewed with patient. RXs given to patient/family. All questions answered at this time. Transport per family.  Ave Filter, RN

## 2014-09-25 NOTE — Discharge Summary (Signed)
Physician Discharge Summary  Brad [name] FIE:[id number] DOB: 10/24/1955 DOA: 09/23/2014  PCP: [name], MD  Admit date: 09/23/2014 Discharge date: 09/25/2014  Time spent: 35 minutes  Recommendations for Outpatient Follow-up:  Follow up with [name] for elective CAE. Needs further adjustment of diabetes medications.  Needs LFT in 1 week. Cbc   Discharge Diagnoses:    TIA   Essential hypertension, benign   Type 2 diabetes mellitus with other circulatory complications   Carotid stenosis   Stroke (cerebrum)   Tobacco abuse   Stroke   Absolute anemia   Discharge Condition: stable.   Diet recommendation: Carb Modified.   Filed Weights   09/23/14 1253  Weight: 122.471 kg (270 lb)    History of present illness:  Brad [name] is a 59 y.o. male with a history of tobacco abuse, type 2 diabetes mellitus, hypertension, carotid artery stenosis who presents to the emergency department with complaints of slurred speech and left facial numbness. He has a history of remote lacunar infarcts involving the right thalamus and left putamen based on MRI of brain performed on 02/28/2014. Doppler ultrasound performed on 03/01/2014 showed a left 60-79% ICA stenosis. Patient reporting been in his usual state health when he developed sudden onset left temporal pain associated with decrease vision to was left I along with slurred speech and left hemi-face numbness. He reported symptoms started at approximately 8 AM at which time he was sitting in his kitchen table. Symptoms gradually improved and resolved while in the emergency department. A CT scan of brain without contrast performed in the ER did not show evidence of acute intracranial findings. Lab work did reveal presence of anemia having an initial hemoglobin of 7.0 and 6.6 on recheck. He denies hemoptysis, hematemesis, black tarry stools, although reports having history of bright red blood per rectum which has attributed to  hemorrhoids.  Hospital Course:  1-TIA;  ECHO Normal EF.  Carotid Doppler RICA 16-60% and LICA 63-01%, Neurology following.  Discussed with Dr Erlinda Hong , ok to use aspirin only for stroke prevention and discontinue pletal as recommended by [name] anticipating surgery.  On statin. He will need LFT in 1 week.  LDL 38.   2-Anemia, severe; s/p 2 unit of PRBC. Hb increase to 8. S/p endoscopy which showed multiple duodenal ulcer.  On PPI. Discussed with Dr Marquette Saa ok to continue with Aspirin. He will need iron supplement.   3-Bilateral carotid stenosis. Vascular consulted. Elective CEA outpatient.  4-Hypothyroidism continue with synthroid.  5-Diabetes; resume home glipizide and metformin. Patient was not taking glipizide. Suspect if he is complaint with medication blood glucose control will improved, and he wont need insulin.   Procedures:  ECHO;Left ventricle: The cavity size was normal. There was severe focal basal and moderate concentric hypertrophy. Systolic function was normal. The estimated ejection fraction was in the range of 55% to 60%. Wall motion was normal; there were no regional wall motion abnormalities. Features are consistent with a pseudonormal left ventricular filling pattern, with concomitant abnormal relaxation and increased filling pressure (grade 2 diastolic dysfunction). - Aortic valve: Moderate thickening and calcification, consistent with sclerosis. - Mitral valve: Calcified annulus. There was trivial regurgitation. - Atrial septum: There was increased thickness of the septum, consistent with lipomatous hypertrophy.  Doppler; Preliminary findings: Right = 60-79% ICA stenosis. Left = 80-99% ICA stenosis.  Stenosis has worsened bilaterally since last study 02/2014 where previously the right was 1-39% range and the left was 60-79% range. Ct Angio Neck W/cm &/  or Wo/cm 09/24/2014  1. Heavy atheromatous plaque about the carotid bifurcations  extending into the proximal ICAs bilaterally. There is associated stenoses of approximately 70-80% by NASCET criteria bilaterally, slightly worse on the left.  Consultations:  Neurology  vascular  Discharge Exam: Filed Vitals:   09/25/14 0837  BP: 172/76  Pulse: 82  Temp: 98.2 F (36.8 C)  Resp: 20    General: Alert in no distress.  Cardiovascular: S 1, S 2 RRR Respiratory: CTA  Discharge Instructions   Discharge Instructions    Diet - low sodium heart healthy    Complete by:  As directed      Increase activity slowly    Complete by:  As directed           Current Discharge Medication List    START taking these medications   Details  ferrous sulfate 325 (65 FE) MG tablet Take 1 tablet (325 mg total) by mouth 3 (three) times daily with meals. Qty: 30 tablet, Refills: 3    nicotine (NICODERM CQ - DOSED IN MG/24 HOURS) 21 mg/24hr patch Place 1 patch (21 mg total) onto the skin daily. Qty: 28 patch, Refills: 0    pantoprazole (PROTONIX) 40 MG tablet Take 1 tablet (40 mg total) by mouth 2 (two) times daily. Qty: 60 tablet, Refills: 0      CONTINUE these medications which have CHANGED   Details  aspirin EC 325 MG tablet Take 1 tablet (325 mg total) by mouth daily. Qty: 30 tablet, Refills: 0    glipiZIDE (GLIPIZIDE XL) 2.5 MG 24 hr tablet Take 1 tablet (2.5 mg total) by mouth daily with breakfast. As needed for blood sugar > 200 Qty: 90 tablet, Refills: 3    metFORMIN (GLUCOPHAGE) 500 MG tablet Take 2 tablets (1,000 mg total) by mouth daily. Qty: 60 tablet, Refills: 0      CONTINUE these medications which have NOT CHANGED   Details  albuterol (PROVENTIL HFA;VENTOLIN HFA) 108 (90 BASE) MCG/ACT inhaler Inhale 2 puffs into the lungs every 6 (six) hours as needed for wheezing or shortness of breath. Qty: 1 Inhaler, Refills: 11    Blood Pressure Monitor DEVI Use to check Blood pressure daily Qty: 1 Device, Refills: 0   Associated Diagnoses: Essential  hypertension, benign    furosemide (LASIX) 40 MG tablet 1 tab by mouth in the AM, with a second dose in PM as needed only for swelling Qty: 60 tablet, Refills: 11    glucosamine-chondroitin 500-400 MG tablet Take 2 tablets by mouth daily.    glucose blood test strip 1 each by Other route See admin instructions. Check blood sugar once daily.    HYDROcodone-homatropine (HYCODAN) 5-1.5 MG/5ML syrup Take 5 mLs by mouth every 6 (six) hours as needed. Qty: 180 mL, Refills: 0    Lancets MISC 1 each by Other route See admin instructions. Check blood sugar once daily.    levothyroxine (SYNTHROID, LEVOTHROID) 88 MCG tablet Take 1 tablet (88 mcg total) by mouth daily. Qty: 90 tablet, Refills: 3    lisinopril (PRINIVIL,ZESTRIL) 20 MG tablet Take 1 tablet (20 mg total) by mouth daily. Qty: 90 tablet, Refills: 3   Associated Diagnoses: Cerebral infarction due to thrombosis of basilar artery    nortriptyline (PAMELOR) 10 MG capsule Take 1 capsule (10 mg total) by mouth at bedtime. Qty: 90 capsule, Refills: 5    simvastatin (ZOCOR) 40 MG tablet Take 1 tablet (40 mg total) by mouth at bedtime. Qty: 90 tablet, Refills: 3  tiotropium (SPIRIVA) 18 MCG inhalation capsule Place 18 mcg into inhaler and inhale 2 (two) times daily as needed (for wheezing).    tiZANidine (ZANAFLEX) 4 MG tablet Take 8 mg by mouth every 6 (six) hours as needed for muscle spasms.    traMADol (ULTRAM) 50 MG tablet 1-2 tabs by mouth up to four times per day Qty: 120 tablet, Refills: 2      STOP taking these medications     cilostazol (PLETAL) 100 MG tablet      ibuprofen (ADVIL,MOTRIN) 200 MG tablet      levofloxacin (LEVAQUIN) 250 MG tablet        Allergies  Allergen Reactions  . Doxycycline Hives and Itching  . Lipitor [Atorvastatin] Other (See Comments)    Myalgia   . Oxycodone Itching      The results of significant diagnostics from this hospitalization (including imaging, microbiology, ancillary and  laboratory) are listed below for reference.    Significant Diagnostic Studies: Ct Head Wo Contrast  09/23/2014   CLINICAL DATA:  Headache, dizziness, weakness. Visual change in the left eye with left-sided headache. Code stroke.  EXAM: CT HEAD WITHOUT CONTRAST  TECHNIQUE: Contiguous axial images were obtained from the base of the skull through the vertex without intravenous contrast.  COMPARISON:  02/28/2014  FINDINGS: Skull and Sinuses:Negative for fracture or destructive process. The mastoids, middle ears, and imaged paranasal sinuses are clear.  Orbits: No acute abnormality.  Brain: No evidence of acute infarction, hemorrhage, hydrocephalus, or mass lesion/mass effect. Remote lacunar infarct in the right thalamus and small remote ischemic focus in the left corona radiata. Low-attenuation along the lower right sylvian fissure is most consistent with a dilated perivascular space.  Since this is a code stroke, page to Dr. Nicole Kindred placed on 09/23/2014 at 10:04 am.  IMPRESSION: 1. No acute intracranial findings. 2. Chronic small vessel disease with remote right thalamus lacunar infarct.   Electronically Signed   By: Monte Fantasia M.D.   On: 09/23/2014 10:06   Ct Angio Neck W/cm &/or Wo/cm  09/24/2014   CLINICAL DATA:  Initial evaluation for carotid stenosis.  EXAM: CT ANGIOGRAPHY NECK  TECHNIQUE: Multidetector CT imaging of the neck was performed using the standard protocol during bolus administration of intravenous contrast. Multiplanar CT image reconstructions and MIPs were obtained to evaluate the vascular anatomy. Carotid stenosis measurements (when applicable) are obtained utilizing NASCET criteria, using the distal internal carotid diameter as the denominator.  CONTRAST:  35mL OMNIPAQUE IOHEXOL 350 MG/ML SOLN  COMPARISON:  Prior intracranial MRA from earlier on the same day.  FINDINGS: Aortic arch: The visualized aortic arch is of normal caliber are with normal 3 vessel morphology. Scattered calcified  plaque present within the partially visualized arch in at the origin of the great vessels. No high-grade stenosis seen at the origin of the great vessels. Visualized subclavian arteries widely patent.  Right carotid system: Right common carotid artery bottle opacified to the level of the carotid bifurcation. There are is heavy calcified atheromatous plaque seen about the right carotid bifurcation extending into the proximal right ICA. There is associated stenosis of approximately 70-80% by NASCET criteria. The area of involvement extends from the carotid bifurcation and measures approximately 6 mm in length. There is additional coarse calcification slightly more distally within the right ICA without significant stenosis. Distally, the right ICA is well opacified to the skullbase. Prominent arterial calcifications present within the cavernous right ICA.  Left carotid system: Scattered calcified plaque present at the origin  of the left common carotid artery without significant stenosis. Left common carotid artery well opacified to the carotid bifurcation. There is extensive coarse atheromatous calcified plaque at the left carotid bifurcation extending into the proximal left ICA. There is associated stenosis of up to 70-80% by NASCET criteria. The area of involvement extends from the carotid bifurcation in measures approximately 12 mm in length. Distally, the left ICA is well opacified to the skullbase. Scattered atheromatous plaque present within the cavernous left ICA.  Vertebral arteries:Both vertebral artery arise from the subclavian arteries. Minimal atheromatous plaque present at the origin of the vertebral arteries bilaterally, slightly greater on the right. No significant stenosis appreciated. Vertebral arteries well a opacified to the level of the skullbase without evidence of occlusion or high-grade stenosis. The intracranial right vertebral artery is fairly diminutive with a small hypoplastic and stenotic  branch extending to the vertebrobasilar junction beyond the takeoff of the right posterior inferior cerebral artery. This is similar as seen on prior MRA.  Skeleton: Moderate to advanced multilevel degenerative disc disease present within the visualized spine as evidenced by intervertebral disc space narrowing, endplate sclerosis, and osteophytosis. Changes are most severe at the C4-5 through C7-T1 levels. No acute osseous abnormality. No worrisome lytic or blastic osseous lesion.  Other neck: No acute soft tissue abnormality identified within the neck. No adenopathy. Thyroid gland within normal limits. Visualized superior mediastinum within normal limits. Visualized lung apices are clear.  IMPRESSION: 1. Heavy atheromatous plaque about the carotid bifurcations extending into the proximal ICAs bilaterally. There is associated stenoses of approximately 70-80% by NASCET criteria bilaterally, slightly worse on the left. The area of involvement extends from the carotid bifurcations bilaterally, and extends approximately 6 mm in length on the right, and 12 mm in length on the left. 2. Widely patent vertebral arteries bilaterally. The distal intracranial aspect of the right vertebral artery is markedly diminutive and stenotic as compared to the left. This is similar as seen on prior intracranial MRA.   Electronically Signed   By: Jeannine Boga M.D.   On: 09/24/2014 00:52   Mr Brain Wo Contrast  09/23/2014   CLINICAL DATA:  59 year old male with slurred speech and left facial numbness. Person history of previous lacunar infarcts. Initial encounter.  EXAM: MRI HEAD WITHOUT CONTRAST  MRA HEAD WITHOUT CONTRAST  TECHNIQUE: Multiplanar, multiecho pulse sequences of the brain and surrounding structures were obtained without intravenous contrast. Angiographic images of the head were obtained using MRA technique without contrast.  COMPARISON:  Head CT without contrast 0957 hr today. Brain MRI and MRA 02/28/2014.   FINDINGS: MRI HEAD FINDINGS  Stable cerebral volume since 2015. Major intracranial vascular flow voids are stable. No restricted diffusion to suggest acute infarction. No midline shift, mass effect, evidence of mass lesion, ventriculomegaly, extra-axial collection or acute intracranial hemorrhage. Cervicomedullary junction and pituitary are within normal limits. Negative visualized cervical spine. Stable gray and white matter signal throughout the brain.  Visible internal auditory structures appear normal. Stable mastoids and paranasal sinuses. Stable orbits soft tissues. Visualized scalp soft tissues are within normal limits. Stable bone marrow signal.  MRA HEAD FINDINGS  Chronically decreased antegrade flow signal in the distal right vertebral artery which appears atherosclerotic and stenotic. The right PICA remains patent. Stable antegrade flow signal in the distal left vertebral artery. Left PICA is patent. Stable basilar artery without stenosis. SCA and PCA origins are stable and within normal limits. Bilateral PCA branches are stable with mild left P2 segment stenosis, preserved  distal PCA flow. Stable posterior communicating arteries.  Stable antegrade flow in both ICA siphons with moderate left greater than right siphon irregularity corresponding to calcified atherosclerotic plaque on the earlier CT. Up to mild siphon stenosis on the left. Ophthalmic artery origins are within normal limits. Carotid termini remain patent.  Dominant right ACA A1 segment again noted. Anterior communicating artery and visualized bilateral ACA branches are stable and within normal limits. Visualized left MCA branches are stable and within normal limits.  IMPRESSION: 1. No acute intracranial abnormality. Stable non contrast MRI appearance of the brain since 2015. 2. Stable intracranial MRA; chronically diseased and stenotic distal right vertebral artery, and left greater than right carotid siphon atherosclerosis.   Electronically  Signed   By: Genevie Ann M.D.   On: 09/23/2014 15:29   Mr Jodene Nam Head/brain Wo Cm  09/23/2014   CLINICAL DATA:  59 year old male with slurred speech and left facial numbness. Person history of previous lacunar infarcts. Initial encounter.  EXAM: MRI HEAD WITHOUT CONTRAST  MRA HEAD WITHOUT CONTRAST  TECHNIQUE: Multiplanar, multiecho pulse sequences of the brain and surrounding structures were obtained without intravenous contrast. Angiographic images of the head were obtained using MRA technique without contrast.  COMPARISON:  Head CT without contrast 0957 hr today. Brain MRI and MRA 02/28/2014.  FINDINGS: MRI HEAD FINDINGS  Stable cerebral volume since 2015. Major intracranial vascular flow voids are stable. No restricted diffusion to suggest acute infarction. No midline shift, mass effect, evidence of mass lesion, ventriculomegaly, extra-axial collection or acute intracranial hemorrhage. Cervicomedullary junction and pituitary are within normal limits. Negative visualized cervical spine. Stable gray and white matter signal throughout the brain.  Visible internal auditory structures appear normal. Stable mastoids and paranasal sinuses. Stable orbits soft tissues. Visualized scalp soft tissues are within normal limits. Stable bone marrow signal.  MRA HEAD FINDINGS  Chronically decreased antegrade flow signal in the distal right vertebral artery which appears atherosclerotic and stenotic. The right PICA remains patent. Stable antegrade flow signal in the distal left vertebral artery. Left PICA is patent. Stable basilar artery without stenosis. SCA and PCA origins are stable and within normal limits. Bilateral PCA branches are stable with mild left P2 segment stenosis, preserved distal PCA flow. Stable posterior communicating arteries.  Stable antegrade flow in both ICA siphons with moderate left greater than right siphon irregularity corresponding to calcified atherosclerotic plaque on the earlier CT. Up to mild siphon  stenosis on the left. Ophthalmic artery origins are within normal limits. Carotid termini remain patent.  Dominant right ACA A1 segment again noted. Anterior communicating artery and visualized bilateral ACA branches are stable and within normal limits. Visualized left MCA branches are stable and within normal limits.  IMPRESSION: 1. No acute intracranial abnormality. Stable non contrast MRI appearance of the brain since 2015. 2. Stable intracranial MRA; chronically diseased and stenotic distal right vertebral artery, and left greater than right carotid siphon atherosclerosis.   Electronically Signed   By: Genevie Ann M.D.   On: 09/23/2014 15:29    Microbiology: No results found for this or any previous visit (from the past 240 hour(s)).   Labs: Basic Metabolic Panel:  Recent Labs Lab 09/23/14 0950 09/23/14 0956 09/24/14 0811 09/25/14 0721  NA 132* 133* 136 135  K 3.9 4.1 3.9 4.6  CL 96 94* 102 101  CO2 29  --  28 27  GLUCOSE 342* 349* 207* 221*  BUN 15 17 13 15   CREATININE 0.99 1.00 0.83 0.90  CALCIUM 8.3*  --  8.6 8.6   Liver Function Tests:  Recent Labs Lab 09/23/14 0950  AST 38*  ALT 30  ALKPHOS 138*  BILITOT 0.5  PROT 6.2  ALBUMIN 2.5*   No results for input(s): LIPASE, AMYLASE in the last 168 hours. No results for input(s): AMMONIA in the last 168 hours. CBC:  Recent Labs Lab 09/23/14 0950 09/23/14 0956 09/23/14 1115 09/24/14 0811 09/25/14 0721  WBC 7.9  --  6.5 5.6 6.1  NEUTROABS 5.4  --   --   --   --   HGB 7.0* 9.2* 6.6* 8.5* 8.6*  HCT 25.6* 27.0* 23.5* 29.3* 29.7*  MCV 66.0*  --  65.8* 67.0* 66.9*  PLT 261  --  223 242 237   Cardiac Enzymes: No results for input(s): CKTOTAL, CKMB, CKMBINDEX, TROPONINI in the last 168 hours. BNP: BNP (last 3 results) No results for input(s): BNP in the last 8760 hours.  ProBNP (last 3 results) No results for input(s): PROBNP in the last 8760 hours.  CBG:  Recent Labs Lab 09/24/14 0641 09/24/14 1642  09/24/14 2107 09/25/14 0644 09/25/14 1108  GLUCAP 214* 178* 200* 195* 167*       Signed:  Kyriaki Moder A  [facility] 09/25/2014, 12:24 PM

## 2014-10-08 ENCOUNTER — Encounter: Payer: Self-pay | Admitting: Internal Medicine

## 2014-10-08 ENCOUNTER — Other Ambulatory Visit (INDEPENDENT_AMBULATORY_CARE_PROVIDER_SITE_OTHER): Payer: Medicare Other

## 2014-10-08 ENCOUNTER — Other Ambulatory Visit: Payer: Self-pay

## 2014-10-08 ENCOUNTER — Ambulatory Visit (INDEPENDENT_AMBULATORY_CARE_PROVIDER_SITE_OTHER): Payer: Medicare Other | Admitting: Internal Medicine

## 2014-10-08 VITALS — BP 160/72 | HR 74 | Temp 98.1°F | Ht 71.0 in | Wt 272.1 lb

## 2014-10-08 DIAGNOSIS — D649 Anemia, unspecified: Secondary | ICD-10-CM | POA: Diagnosis not present

## 2014-10-08 DIAGNOSIS — I639 Cerebral infarction, unspecified: Secondary | ICD-10-CM

## 2014-10-08 DIAGNOSIS — I10 Essential (primary) hypertension: Secondary | ICD-10-CM

## 2014-10-08 DIAGNOSIS — E785 Hyperlipidemia, unspecified: Secondary | ICD-10-CM | POA: Diagnosis not present

## 2014-10-08 LAB — CBC WITH DIFFERENTIAL/PLATELET
Basophils Absolute: 0.1 10*3/uL (ref 0.0–0.1)
Basophils Relative: 1 % (ref 0.0–3.0)
Eosinophils Absolute: 0.3 10*3/uL (ref 0.0–0.7)
Eosinophils Relative: 3.9 % (ref 0.0–5.0)
HCT: 28.2 % — ABNORMAL LOW (ref 39.0–52.0)
Hemoglobin: 9 g/dL — ABNORMAL LOW (ref 13.0–17.0)
Lymphocytes Relative: 24.7 % (ref 12.0–46.0)
Lymphs Abs: 2.1 10*3/uL (ref 0.7–4.0)
MCHC: 31.9 g/dL (ref 30.0–36.0)
MCV: 68.6 fl — ABNORMAL LOW (ref 78.0–100.0)
Monocytes Absolute: 0.8 10*3/uL (ref 0.1–1.0)
Monocytes Relative: 9.8 % (ref 3.0–12.0)
Neutro Abs: 5.2 10*3/uL (ref 1.4–7.7)
Neutrophils Relative %: 60.6 % (ref 43.0–77.0)
Platelets: 310 10*3/uL (ref 150.0–400.0)
RBC: 4.11 Mil/uL — ABNORMAL LOW (ref 4.22–5.81)
RDW: 27.8 % — ABNORMAL HIGH (ref 11.5–15.5)
WBC: 8.6 10*3/uL (ref 4.0–10.5)

## 2014-10-08 LAB — HEPATIC FUNCTION PANEL
ALT: 27 U/L (ref 0–53)
AST: 42 U/L — ABNORMAL HIGH (ref 0–37)
Albumin: 3.2 g/dL — ABNORMAL LOW (ref 3.5–5.2)
Alkaline Phosphatase: 121 U/L — ABNORMAL HIGH (ref 39–117)
Bilirubin, Direct: 0.1 mg/dL (ref 0.0–0.3)
Total Bilirubin: 0.3 mg/dL (ref 0.2–1.2)
Total Protein: 6.8 g/dL (ref 6.0–8.3)

## 2014-10-08 LAB — BASIC METABOLIC PANEL
BUN: 25 mg/dL — ABNORMAL HIGH (ref 6–23)
CO2: 32 mEq/L (ref 19–32)
Calcium: 9 mg/dL (ref 8.4–10.5)
Chloride: 97 mEq/L (ref 96–112)
Creatinine, Ser: 1.16 mg/dL (ref 0.40–1.50)
GFR: 69 mL/min (ref >60.00)
Glucose, Bld: 159 mg/dL — ABNORMAL HIGH (ref 70–99)
Potassium: 3.9 mEq/L (ref 3.5–5.1)
Sodium: 132 mEq/L — ABNORMAL LOW (ref 135–145)

## 2014-10-08 MED ORDER — TRAMADOL HCL 50 MG PO TABS: ORAL_TABLET | ORAL | Status: DC

## 2014-10-08 MED ORDER — AMLODIPINE BESYLATE 5 MG PO TABS: 5.0000 mg | ORAL_TABLET | Freq: Every day | ORAL | Status: DC

## 2014-10-08 NOTE — Progress Notes (Signed)
Subjective:    Patient ID: Brad [name], male    DOB: [birth year], 57 y.o.   MRN: [id number]  HPI  Here to f/u post hospn for PUD/duodenal ulcer per Gi/Dr Edwardsl  Pt denies chest pain, increased sob or doe, wheezing, orthopnea, PND, increased LE swelling, palpitations, dizziness or syncope.  Denies worsening reflux, abd pain, dysphagia, n/v, bowel change or blood.   Pt denies polydipsia, polyuria, No overt bleeding or GI symtpms.  Pt denies fever, wt loss, night sweats, loss of appetite, or other constitutional symptoms  No falls or worsening dizzines. Pt denies new neurological symptoms such as new headache, or facial or extremity weakness or numbness  For CEA mar 25.  BP has been elevtated persistent recently despite good med compliance.  Due for f/u labs Past Medical History  Diagnosis Date  . Depression   . GERD (gastroesophageal reflux disease)   . Diabetes mellitus type II   . COPD (chronic obstructive pulmonary disease)   . Ventral hernia   . Chronic LBP   . Hyperlipidemia 08/27/2014  . Hypothyroidism 08/27/2014  . Carotid stenosis 08/27/2014   Past Surgical History  Procedure Laterality Date  . Neg stress test  2000  . Rotator cuff repair  2009    multiple f/u Sxs/infection Tx  . Incision and drainage perirectal abscess  03/2009  . Esophagogastroduodenoscopy N/A 09/24/2014    Procedure: ESOPHAGOGASTRODUODENOSCOPY (EGD);  Surgeon: Winfield Cunas., MD;  Location: [facility] ENDOSCOPY;  Service: Endoscopy;  Laterality: N/A;    reports that he has been smoking Cigarettes.  He has a 60 pack-year smoking history. He has never used smokeless tobacco. He reports that he drinks alcohol. He reports that he does not use illicit drugs. family history includes Alcohol abuse in his father; Diabetes type II in his mother; Heart disease in his mother; Lung cancer in his father, maternal grandmother, and sister. Allergies  Allergen Reactions  . Doxycycline Hives and Itching  . Lipitor  [Atorvastatin] Other (See Comments)    Myalgia   . Oxycodone Itching   Current Outpatient Prescriptions on File Prior to Visit  Medication Sig Dispense Refill  . albuterol (PROVENTIL HFA;VENTOLIN HFA) 108 (90 BASE) MCG/ACT inhaler Inhale 2 puffs into the lungs every 6 (six) hours as needed for wheezing or shortness of breath. 1 Inhaler 11  . aspirin EC 325 MG tablet Take 1 tablet (325 mg total) by mouth daily. 30 tablet 0  . Blood Pressure Monitor DEVI Use to check Blood pressure daily 1 Device 0  . ferrous sulfate 325 (65 FE) MG tablet Take 1 tablet (325 mg total) by mouth 3 (three) times daily with meals. 30 tablet 3  . furosemide (LASIX) 40 MG tablet 1 tab by mouth in the AM, with a second dose in PM as needed only for swelling 60 tablet 11  . glipiZIDE (GLIPIZIDE XL) 2.5 MG 24 hr tablet Take 1 tablet (2.5 mg total) by mouth daily with breakfast. As needed for blood sugar > 200 90 tablet 3  . glucosamine-chondroitin 500-400 MG tablet Take 2 tablets by mouth daily.    Marland Kitchen glucose blood test strip 1 each by Other route See admin instructions. Check blood sugar once daily.    . Lancets MISC 1 each by Other route See admin instructions. Check blood sugar once daily.    Marland Kitchen levothyroxine (SYNTHROID, LEVOTHROID) 88 MCG tablet Take 1 tablet (88 mcg total) by mouth daily. 90 tablet 3  . lisinopril (PRINIVIL,ZESTRIL) 20 MG tablet  Take 1 tablet (20 mg total) by mouth daily. 90 tablet 3  . metFORMIN (GLUCOPHAGE) 500 MG tablet Take 2 tablets (1,000 mg total) by mouth daily. 60 tablet 0  . nicotine (NICODERM CQ - DOSED IN MG/24 HOURS) 21 mg/24hr patch Place 1 patch (21 mg total) onto the skin daily. 28 patch 0  . nortriptyline (PAMELOR) 10 MG capsule Take 1 capsule (10 mg total) by mouth at bedtime. 90 capsule 5  . pantoprazole (PROTONIX) 40 MG tablet Take 1 tablet (40 mg total) by mouth 2 (two) times daily. 60 tablet 0  . simvastatin (ZOCOR) 40 MG tablet Take 1 tablet (40 mg total) by mouth at bedtime. 90  tablet 3  . tiotropium (SPIRIVA) 18 MCG inhalation capsule Place 18 mcg into inhaler and inhale 2 (two) times daily as needed (for wheezing).    Marland Kitchen tiZANidine (ZANAFLEX) 4 MG tablet Take 8 mg by mouth every 6 (six) hours as needed for muscle spasms.     No current facility-administered medications on file prior to visit.      Review of Systems  Constitutional: Negative for unusual diaphoresis or other sweats  HENT: Negative for ringing in ear Eyes: Negative for double vision or worsening visual disturbance.  Respiratory: Negative for choking and stridor.   Gastrointestinal: Negative for vomiting or other signifcant bowel change Genitourinary: Negative for hematuria or decreased urine volume.  Musculoskeletal: Negative for other MSK pain or swelling Skin: Negative for color change and worsening wound.  Neurological: Negative for tremors and numbness other than noted  Psychiatric/Behavioral: Negative for decreased concentration or agitation other than above  '    Objective:   Physical Exam BP 160/72 mmHg  Pulse 74  Temp(Src) 98.1 F (36.7 C) (Oral)  Ht 5\' 11"  (1.803 m)  Wt 272 lb 1.3 oz (123.415 kg)  BMI 37.96 kg/m2  SpO2 97% VS noted,  Constitutional: Pt appears well-developed, well-nourished.  HENT: Head: NCAT.  Right Ear: External ear normal.  Left Ear: External ear normal.  Eyes: . Pupils are equal, round, and reactive to light. Conjunctivae and EOM are normal Neck: Normal range of motion. Neck supple.  Cardiovascular: Normal rate and regular rhythm.   Pulmonary/Chest: Effort normal and breath sounds without rales or wheezing.  Abd:  Soft, NT, ND, + BS Neurological: Pt is alert. Not confused , motor grossly intact Skin: Skin is warm. No rash Psychiatric: Pt behavior is normal. No agitation.   BP Readings from Last 3 Encounters:  10/08/14 160/72  09/25/14 172/76  08/27/14 144/68       Assessment & Plan:

## 2014-10-08 NOTE — Patient Instructions (Signed)
Please take all new medication as prescribed - the amlodipine 5 mg per day for blood pressure  Please continue all other medications as before, and refills have been done if requested.  Please have the pharmacy call with any other refills you may need.  Please continue your efforts at being more active, low cholesterol diet, and weight control.  Please keep your appointments with your specialists as you may have planned  Please go to the LAB in the Basement (turn left off the elevator) for the tests to be done today  You will be contacted by phone if any changes need to be made immediately.  Otherwise, you will receive a letter about your results with an explanation, but please check with MyChart first.  Please remember to sign up for MyChart if you have not done so, as this will be important to you in the future with finding out test results, communicating by private email, and scheduling acute appointments online when needed.

## 2014-10-08 NOTE — Progress Notes (Signed)
Pre visit review using our clinic review tool, if applicable. No additional management support is needed unless otherwise documented below in the visit note. 

## 2014-10-08 NOTE — Assessment & Plan Note (Signed)
To add amlod 5 qd,  to f/u any worsening symptoms or concerns

## 2014-10-08 NOTE — Assessment & Plan Note (Signed)
stable overall by history and exam, recent data reviewed with pt, and pt to continue medical treatment as before,  to f/u any worsening symptoms or concerns Lab Results  Component Value Date   LDLCALC 38 09/24/2014

## 2014-10-08 NOTE — Assessment & Plan Note (Signed)
stable overall by history and exam, recent data reviewed with pt, and pt to continue medical treatment as before,  to f/u any worsening symptoms or concerns, for f/u labs

## 2014-10-21 ENCOUNTER — Other Ambulatory Visit: Payer: Self-pay | Admitting: Internal Medicine

## 2014-10-21 ENCOUNTER — Telehealth: Payer: Self-pay

## 2014-10-21 ENCOUNTER — Telehealth: Payer: Self-pay | Admitting: Internal Medicine

## 2014-10-21 ENCOUNTER — Encounter (HOSPITAL_COMMUNITY)
Admission: RE | Admit: 2014-10-21 | Discharge: 2014-10-21 | Disposition: A | Discharge status: Home / Self Care | Payer: Medicare Other | Source: Ambulatory Visit | Attending: Surgery | Admitting: Surgery

## 2014-10-21 ENCOUNTER — Encounter (HOSPITAL_COMMUNITY)
Admission: RE | Admit: 2014-10-21 | Discharge: 2014-10-21 | Disposition: A | Discharge status: Home / Self Care | Payer: Medicare Other | Source: Ambulatory Visit | Attending: Anesthesiology | Admitting: Anesthesiology

## 2014-10-21 ENCOUNTER — Encounter (HOSPITAL_COMMUNITY): Payer: Self-pay

## 2014-10-21 DIAGNOSIS — R05 Cough: Secondary | ICD-10-CM

## 2014-10-21 DIAGNOSIS — R059 Cough, unspecified: Secondary | ICD-10-CM

## 2014-10-21 HISTORY — DX: Reserved for inherently not codable concepts without codable children: IMO0001

## 2014-10-21 HISTORY — DX: Personal history of other medical treatment: Z92.89

## 2014-10-21 HISTORY — DX: Nonrheumatic mitral (valve) prolapse: I34.1

## 2014-10-21 HISTORY — DX: Unspecified osteoarthritis, unspecified site: M19.90

## 2014-10-21 HISTORY — DX: Pain in unspecified joint: M25.50

## 2014-10-21 HISTORY — DX: Edema, unspecified: R60.9

## 2014-10-21 HISTORY — DX: Localized edema: R60.0

## 2014-10-21 HISTORY — DX: Insomnia, unspecified: G47.00

## 2014-10-21 HISTORY — DX: Other muscle spasm: M62.838

## 2014-10-21 HISTORY — DX: Polyneuropathy, unspecified: G62.9

## 2014-10-21 HISTORY — DX: Personal history of peptic ulcer disease: Z87.11

## 2014-10-21 HISTORY — DX: Personal history of other venous thrombosis and embolism: Z86.718

## 2014-10-21 HISTORY — DX: Cerebral infarction, unspecified: I63.9

## 2014-10-21 HISTORY — DX: Headache, unspecified: R51.9

## 2014-10-21 HISTORY — DX: Anemia, unspecified: D64.9

## 2014-10-21 HISTORY — DX: Urgency of urination: R39.15

## 2014-10-21 HISTORY — DX: Headache: R51

## 2014-10-21 HISTORY — DX: Essential (primary) hypertension: I10

## 2014-10-21 HISTORY — DX: Frequency of micturition: R35.0

## 2014-10-21 HISTORY — DX: Personal history of other diseases of the digestive system: Z87.19

## 2014-10-21 HISTORY — DX: Dizziness and giddiness: R42

## 2014-10-21 LAB — URINALYSIS, ROUTINE W REFLEX MICROSCOPIC
Bilirubin Urine: NEGATIVE
Glucose, UA: 100 mg/dL — AB
Ketones, ur: NEGATIVE mg/dL
Leukocytes, UA: NEGATIVE
Nitrite: NEGATIVE
Protein, ur: >300 mg/dL — AB
Specific Gravity, Urine: 1.017 (ref 1.005–1.030)
Urobilinogen, UA: 0.2 mg/dL (ref 0.0–1.0)
pH: 5 (ref 5.0–8.0)

## 2014-10-21 LAB — COMPREHENSIVE METABOLIC PANEL
ALT: 30 U/L (ref 0–53)
AST: 35 U/L (ref 0–37)
Albumin: 2.7 g/dL — ABNORMAL LOW (ref 3.5–5.2)
Alkaline Phosphatase: 115 U/L (ref 39–117)
Anion gap: 9 (ref 5–15)
BUN: 17 mg/dL (ref 6–23)
CO2: 25 mmol/L (ref 19–32)
Calcium: 8.8 mg/dL (ref 8.4–10.5)
Chloride: 98 mmol/L (ref 96–112)
Creatinine, Ser: 1.15 mg/dL (ref 0.50–1.35)
GFR calc Af Amer: 80 mL/min — ABNORMAL LOW (ref >90)
GFR calc non Af Amer: 69 mL/min — ABNORMAL LOW (ref >90)
Glucose, Bld: 115 mg/dL — ABNORMAL HIGH (ref 70–99)
Potassium: 4.1 mmol/L (ref 3.5–5.1)
Sodium: 132 mmol/L — ABNORMAL LOW (ref 135–145)
Total Bilirubin: 0.7 mg/dL (ref 0.3–1.2)
Total Protein: 6.5 g/dL (ref 6.0–8.3)

## 2014-10-21 LAB — CBC
HCT: 24.6 % — ABNORMAL LOW (ref 39.0–52.0)
Hemoglobin: 7.4 g/dL — ABNORMAL LOW (ref 13.0–17.0)
MCH: 21.2 pg — ABNORMAL LOW (ref 26.0–34.0)
MCHC: 30.1 g/dL (ref 30.0–36.0)
MCV: 70.5 fL — ABNORMAL LOW (ref 78.0–100.0)
Platelets: 260 10*3/uL (ref 150–400)
RBC: 3.49 MIL/uL — ABNORMAL LOW (ref 4.22–5.81)
RDW: 20.6 % — ABNORMAL HIGH (ref 11.5–15.5)
WBC: 8.5 10*3/uL (ref 4.0–10.5)

## 2014-10-21 LAB — URINE MICROSCOPIC-ADD ON

## 2014-10-21 LAB — PROTIME-INR
INR: 1.07 (ref 0.00–1.49)
Prothrombin Time: 14 seconds (ref 11.6–15.2)

## 2014-10-21 LAB — SURGICAL PCR SCREEN
MRSA, PCR: POSITIVE — AB
Staphylococcus aureus: POSITIVE — AB

## 2014-10-21 LAB — APTT: aPTT: 28 seconds (ref 24–37)

## 2014-10-21 MED ORDER — CHLORHEXIDINE GLUCONATE 4 % EX LIQD: 60.0000 mL | Freq: Once | CUTANEOUS | Status: DC

## 2014-10-21 MED ORDER — ONDANSETRON 8 MG PO TBDP: 8.0000 mg | ORAL_TABLET | Freq: Three times a day (TID) | ORAL | Status: DC | PRN

## 2014-10-21 NOTE — Telephone Encounter (Signed)
Needs OV.  

## 2014-10-21 NOTE — Progress Notes (Signed)
CXR done b/c recent cough with fever

## 2014-10-21 NOTE — Telephone Encounter (Signed)
Called by Triage RN at 8 PM with request for nausea medication. Chart reviewed, I note that he was recently admitted with anemia due to  duodenal ulcers by EGD and note that wife had called earlier (6:18 PM?) with same request..  Sent  rx for #20 Zofran ODT 8 mg to usual pharmacy

## 2014-10-21 NOTE — Progress Notes (Signed)
Mupirocin script called into the [facility] on [address]

## 2014-10-21 NOTE — Pre-Procedure Instructions (Signed)
Brad [name]  10/21/2014   Your procedure is scheduled on:  Fri, Mar 25 @ 7:30 AM  Report to [facility]  at 5:30 AM.  Call this number if you have problems the morning of surgery: [phone]   Remember:   Do not eat food or drink liquids after midnight.   Take these medicines the morning of surgery with A SIP OF WATER: Albuterol<Bring Your Inhaler With You>,Amlodipine(Norvasc),Synthroid(Levothyroxine),Pantoprazole(Protonix),Spiriva(Tiotropium),Aspirin,and Tramadol(Ultram-if needed)              No Goody's,BC's,Aleve,Ibuprofen,Fish Oil,or any Herbal Medications.                   Do not wear jewelry  Do not wear lotions, powders, or colognes. You may wear deodorant.  Men may shave face and neck.  Do not bring valuables to the hospital.  [facility] is not responsible                  for any belongings or valuables.               Contacts, dentures or bridgework may not be worn into surgery.  Leave suitcase in the car. After surgery it may be brought to your room.  For patients admitted to the hospital, discharge time is determined by your                treatment team.                Special Instructions:  [facility] - Preparing for Surgery  Before surgery, you can play an important role.  Because skin is not sterile, your skin needs to be as free of germs as possible.  You can reduce the number of germs on you skin by washing with CHG (chlorahexidine gluconate) soap before surgery.  CHG is an antiseptic cleaner which kills germs and bonds with the skin to continue killing germs even after washing.  Please DO NOT use if you have an allergy to CHG or antibacterial soaps.  If your skin becomes reddened/irritated stop using the CHG and inform your nurse when you arrive at Short Stay.  Do not shave (including legs and underarms) for at least 48 hours prior to the first CHG shower.  You may shave your face.  Please follow these instructions carefully:   1.  Shower  with CHG Soap the night before surgery and the                                morning of Surgery.  2.  If you choose to wash your hair, wash your hair first as usual with your       normal shampoo.  3.  After you shampoo, rinse your hair and body thoroughly to remove the                      Shampoo.  4.  Use CHG as you would any other liquid soap.  You can apply chg directly       to the skin and wash gently with scrungie or a clean washcloth.  5.  Apply the CHG Soap to your body ONLY FROM THE NECK DOWN.        Do not use on open wounds or open sores.  Avoid contact with your eyes,       ears, mouth and genitals (private parts).  Wash genitals (  private parts)       with your normal soap.  6.  Wash thoroughly, paying special attention to the area where your surgery        will be performed.  7.  Thoroughly rinse your body with warm water from the neck down.  8.  DO NOT shower/wash with your normal soap after using and rinsing off       the CHG Soap.  9.  Pat yourself dry with a clean towel.            10.  Wear clean pajamas.            11.  Place clean sheets on your bed the night of your first shower and do not        sleep with pets.  Day of Surgery  Do not apply any lotions/deoderants the morning of surgery.  Please wear clean clothes to the hospital/surgery center.     Please read over the following fact sheets that you were given: Pain Booklet, Coughing and Deep Breathing, Blood Transfusion Information, MRSA Information and Surgical Site Infection Prevention

## 2014-10-21 NOTE — Progress Notes (Addendum)
Medical Md is Dr.James [name]  Echo reports in epic from 2015/2016  EKG in epic from 09-23-14  CXR in epic from 02-28-14  Cardiologist is with [facility] test done >9yrs ago  Denies ever having a heart cath

## 2014-10-21 NOTE — Progress Notes (Signed)
I left a message on Carol's voicemail about pts H&H

## 2014-10-21 NOTE — Telephone Encounter (Signed)
Wife called in and wanted to know if you could send something to the pharmacy for the patient. He has been throwing up a lot lately and she is concerned.

## 2014-10-22 NOTE — Progress Notes (Signed)
Phone call to Dr. Jeneen Rinks John's office due to pre-operative Hgb. 7.4 and Hct 24.6.  Left message for nurse re: low H & H and that pt. is scheduled for Left CEA 10/30/14.  Requested return phone call to confirm message was rec'd.

## 2014-10-22 NOTE — Progress Notes (Addendum)
Anesthesia Chart Review:  Pt is 59 year old male scheduled for L CEA on 10/30/2014 with Dr. Trula Slade.   PMH includes: HTN, stroke, carotid stenosis, mitral valve prolapse, DM, anemia, hyperlipidemia, hypothyroidism, COPD, GERD, duodenal ulcers. Current heavy smoker. BMI 38  Pt hospitalized 2/17-2/19/2016 for TIA, anemia likely secondary to duodenal ulcers. Hgb at f/u 10/08/14 was 9.0.  Preoperative labs reviewed.  H/H 7.4/24.6.   Chest x-ray reviewed. No acute abnormality.   EKG 09/23/2014: Sinus rhythm. Incomplete RBBB. Probable anteroseptal infarct, old. Nonspecific T abnormalities, lateral leads.   Echo 09/24/2014: - Left ventricle: The cavity size was normal. There was severe focal basal and moderate concentric hypertrophy. Systolic function was normal. The estimated ejection fraction was in the range of 55% to 60%. Wall motion was normal; there were no regional wall motion abnormalities. Features are consistent with a pseudonormal left ventricular filling pattern, with concomitant abnormal relaxation and increased filling pressure (grade 2 diastolic dysfunction). - Aortic valve: Moderate thickening and calcification, consistent with sclerosis. - Mitral valve: Calcified annulus. There was trivial regurgitation. - Atrial septum: There was increased thickness of the septum, consistent with lipomatous hypertrophy.  Carotid duplex 09/23/2014: - The vertebral arteries appear patent with antegrade flow. - Findings consistent with 60 - 79 percent stenosis involving the right internal carotid artery. - Findings consistent with 80-99 percent stenosis involving the left internal carotid artery.  Notified Arbie Cookey and Zigmund Daniel in Dr. Stephens Shire office of low hgb. Awaiting Dr. Stephens Shire take on it.   Willeen Cass, FNP-BC [facility] Short Stay [facility]/Anesthesiology Phone: [phone] 10/22/2014 5:09 PM  Addendum: Pt will see PCP [name] and f/u with GI about low hgb/ulcers. Also discussed case/reviewed  EKG with Dr. Marcie Bal. Pt will need cardiac clearance prior to surgery. Notified Stephanie in Dr. Stephens Shire office.   Willeen Cass, FNP-BC [facility] Stay Surgical Center/Anesthesiology Phone: [phone] 10/23/2014 2:15 PM  Addendum: Patient was seen by cardiologist Dr. Wynonia Lawman for a preoperative evaluation.  Nuclear stress test on 11/05/14 showed: Probably normal Lexiscan Myoview scan with no evidence of ischemia or infarction. Normal quantitative gated SPECT EF of 54% with normal wall motion and wall thickening.  Recommendations: The patient has no evidence of ischemia at this time. This is a low risk scan and from a cardiac viewpoint may proceed with vascular surgery. He will need to have intensive risk factor modification.   In regards to his anemia.  His PCP repeated CBC on 11/02/14. H/H stable, slightly improved at 7.8/24.9. He is on iron.  Results were sent to Dr. Trula Slade. Our staff called patient yesterday to have him come in for repeat labs, but he declined citing labs done just last week.  I notified anesthesiologist Dr. Orene Desanctis and [facility] RN Arbie Cookey.  At this time, if patient is asymptomatic of his anemia then defer decision to proceed to Dr. Trula Slade.  If plans to proceed then he will need repeat labs on arrival including a T&S versus T&C.  Definitive plans to proceed then will be based on results and evaluation findings.   George Hugh [facility] Short Stay Center/Anesthesiology Phone [phone] 11/12/2014 9:34 AM

## 2014-10-22 NOTE — Telephone Encounter (Signed)
Spoke with the wife this morning. She said that the doctor on call called in some Zofran last night and pt. Received it from the pharmacy. After taking 2 tablets, Pt. Felt well enough to return to work this morning. Wife said that the patient was feeling a lot better.

## 2014-10-23 ENCOUNTER — Other Ambulatory Visit: Payer: Self-pay

## 2014-10-23 ENCOUNTER — Telehealth: Payer: Self-pay

## 2014-10-23 ENCOUNTER — Telehealth: Payer: Self-pay | Admitting: Internal Medicine

## 2014-10-23 NOTE — Telephone Encounter (Signed)
Phone call to pt.  Notified him that his surgery date for Left CEA changed from 3/25 to 11/13/14.  Also advised of abnormal Hgb 7.4.  Stated he has been dizzy all week.  Also c/o increased discomfort in bilateral lower legs with activity.  Stated the right leg "cramps and spasms" with activity.  C/o a burning sensation in both calves when working in the yard, but stated the right leg symptoms are worse than the left.  Advised pt. to contact [name] office regarding his dizziness and low hgb.  Advised pt. Dr. Trula Slade not available until 3/21, but will make him aware of above symptoms.  Verb. Understanding.  Agrees.

## 2014-10-26 ENCOUNTER — Telehealth: Payer: Self-pay | Admitting: Surgery

## 2014-10-26 ENCOUNTER — Other Ambulatory Visit: Payer: Self-pay | Admitting: *Deleted

## 2014-10-26 DIAGNOSIS — Z01818 Encounter for other preprocedural examination: Secondary | ICD-10-CM

## 2014-10-26 NOTE — Telephone Encounter (Addendum)
-----   Message from Gregery Na, RN sent at 10/23/2014  2:45 PM EDT ----- Would you please schedule Brad [name] for cardiac eval. prior to his (L) carotid endarterectomy on 11/13/14? Brad Cass, Brad [name] states pt. will need clearance due to abnormal EKG at pre-op appt. yesterday. Thanks!  Stephanie   10/26/14: I have scheduled the appointment for cardiac clearance on 11/03/14 @ 10:45 with Dr Tollie Eth ([place]). Patient will need to bring actual medications and insurance card. He should receive paperwork in the mail from Dr Ezekiel Slocumb office.   I have not been able to reach pt by phone. Will try again.

## 2014-10-27 NOTE — Telephone Encounter (Signed)
-----   Message from Denman George, RN sent at 10/26/2014  3:45 PM EDT ----- Regarding: low Hgb & Hct I wanted to f/u to be sure you rec'd my voice message on 10/22/14 re: Hgb. 7.4, Hct. 24.6.  This pt. is scheduled for Left CEA 11/13/14.

## 2014-10-27 NOTE — Telephone Encounter (Signed)
cherina to contact pt -   ? Still weakness, dizziness or sob or doe or worsening leg pain with ambulation? Or other unusual symptoms such as CP?  If one or any of these is present, needs to go to ER for Symptomatic anemia, may need transfusion

## 2014-10-27 NOTE — Telephone Encounter (Signed)
I spoke with patient and his fiance Brad [name] to inform of Cardiology appointment.

## 2014-10-28 ENCOUNTER — Telehealth: Payer: Self-pay | Admitting: *Deleted

## 2014-10-28 ENCOUNTER — Other Ambulatory Visit: Payer: Self-pay

## 2014-10-28 DIAGNOSIS — D649 Anemia, unspecified: Secondary | ICD-10-CM

## 2014-10-28 NOTE — Telephone Encounter (Signed)
Pt. States he is still having the leg pain. Leg pain hurts all the way down the bone at times. He is still able to get around for the most part. I told him that he had any SOB or more dizziness to go to the nearest ER. He agreed. No chest pain.

## 2014-10-28 NOTE — Telephone Encounter (Signed)
[place] - Client [facility] Patient Name: Brad [name] Gender: Male DOB: Oct 12, 1955 Age: 59 Y 11 M 5 D Return Phone Number: [phone] (Primary) Address: [place] City/State/Zip: [place] [address] Client [facility] - Client Client Site [facility] - Night Physician John, [place] Type Call Call Type Triage / Clinical Caller Name Mina Carlisi Relationship To Patient Spouse Return Phone Number [phone] (Primary) Chief Complaint Vomiting Initial Comment Caller States Rx is not called in for nausea , vomiting Nurse Assessment Nurse: Donovan Kail, RN, Barnetta [place] Date/Time (Eastern Time): 10/21/2014 7:19:34 PM Please select the assessment type ---RX called in but not at pharm Additional Documentation ---Caller States Rx is not called in for nausea , vomiting Document the name of the medication. ---Nausea medicine Pharmacy name and phone number. ---[facility] [phone] Has the office closed within the last 30 minutes? ---No Does the client directives allow for assistance with medications after hours? ---No Guidelines Guideline Title Affirmed Question Affirmed Notes Nurse Date/Time ([place] Time) Disp. Time Eilene Ghazi Time) Disposition Final User 10/21/2014 7:26:09 PM Paged On Call back to [facility] [place] 10/21/2014 7:28:26 PM Send to [place], RN, Barnetta Chapel 10/21/2014 8:02:35 PM Called On-Call Provider Donovan Kail, RN, Catherine 10/21/2014 8:04:04 PM Send To RN Personal Donovan Kail, RN, Catherine 10/21/2014 8:20:33 PM Call Completed Donovan Kail, RN, Catherine 10/21/2014 8:03:24 PM Clinical Call Yes Donovan Kail, RN, Barnetta Chapel After Care Instructions Given Call Event Type User Date / Time Description Comments User: Tonette Bihari Date/Time Eilene Ghazi Time): 10/21/2014 8:00:10 PM PLEASE NOTE: All timestamps contained within this  report are represented as [place] Standard Time. CONFIDENTIALTY NOTICE: This fax transmission is intended only for the addressee. It contains information that is legally privileged, confidential or otherwise protected from use or disclosure. If you are not the intended recipient, you are strictly prohibited from reviewing, disclosing, copying using or disseminating any of this information or taking any action in reliance on or regarding this information. If you have received this fax in error, please notify us immediately by telephone so that we can arrange for its return to [place]. Phone: [phone], Toll-Free: [phone], Fax: [phone] Page: 2 of 2 Call Id: [id number] Comments Caller states the pharmacy closes at 9 PM. Paging DoctorName DoctorPhone DateTime Result/Outcome Notes Deborra Medina [phone] 10/21/2014 7:26:09 PM Paged On Call Back to Call [place] [phone] 10/21/2014 8:02:35 PM Called On Call Provider - Reached Deborra Medina 10/21/2014 8:02:59 PM Spoke with On Call - General Dr. Derrel Nip is calling in [place]

## 2014-10-28 NOTE — Telephone Encounter (Signed)
[place] for Monday

## 2014-10-28 NOTE — Telephone Encounter (Signed)
Please have pt repeat cbc on Friday mar 25 - symptomatic anemia

## 2014-10-28 NOTE — Telephone Encounter (Signed)
The lab will be closed on Friday for the holiday. What about tomorrow or Monday?

## 2014-10-29 NOTE — Telephone Encounter (Signed)
Talked with pt, he is to come on Monday to lab for repeat cbc

## 2014-11-02 ENCOUNTER — Other Ambulatory Visit (INDEPENDENT_AMBULATORY_CARE_PROVIDER_SITE_OTHER): Payer: Medicare Other

## 2014-11-02 ENCOUNTER — Other Ambulatory Visit: Payer: Self-pay | Admitting: Internal Medicine

## 2014-11-02 ENCOUNTER — Encounter: Payer: Self-pay | Admitting: Internal Medicine

## 2014-11-02 ENCOUNTER — Telehealth: Payer: Self-pay

## 2014-11-02 DIAGNOSIS — D649 Anemia, unspecified: Secondary | ICD-10-CM

## 2014-11-02 DIAGNOSIS — D509 Iron deficiency anemia, unspecified: Secondary | ICD-10-CM

## 2014-11-02 LAB — CBC WITH DIFFERENTIAL/PLATELET
Basophils Absolute: 0.1 10*3/uL (ref 0.0–0.1)
Basophils Relative: 1.1 % (ref 0.0–3.0)
Eosinophils Absolute: 0.2 10*3/uL (ref 0.0–0.7)
Eosinophils Relative: 3.2 % (ref 0.0–5.0)
HCT: 24.9 % — ABNORMAL LOW (ref 39.0–52.0)
Hemoglobin: 7.8 g/dL — CL (ref 13.0–17.0)
Lymphocytes Relative: 20 % (ref 12.0–46.0)
Lymphs Abs: 1.3 10*3/uL (ref 0.7–4.0)
MCHC: 31.4 g/dL (ref 30.0–36.0)
MCV: 69.7 fl — ABNORMAL LOW (ref 78.0–100.0)
Monocytes Absolute: 0.7 10*3/uL (ref 0.1–1.0)
Monocytes Relative: 9.9 % (ref 3.0–12.0)
Neutro Abs: 4.4 10*3/uL (ref 1.4–7.7)
Neutrophils Relative %: 65.8 % (ref 43.0–77.0)
Platelets: 259 10*3/uL (ref 150.0–400.0)
RBC: 3.57 Mil/uL — ABNORMAL LOW (ref 4.22–5.81)
RDW: 25.9 % — ABNORMAL HIGH (ref 11.5–15.5)
WBC: 6.7 10*3/uL (ref 4.0–10.5)

## 2014-11-02 NOTE — Telephone Encounter (Signed)
Hemoglobin is stable to improved compared to value 12 days ago. Elective surgery scheduled 11/11/14. Please fax these lab results to the Cardiovascular Thoracic Surgeon's office.

## 2014-11-02 NOTE — Telephone Encounter (Signed)
Phone call from [facility] [place] lab to report a critical hgb of 7.8

## 2014-11-03 ENCOUNTER — Encounter: Payer: Self-pay | Admitting: Cardiology

## 2014-11-03 DIAGNOSIS — D649 Anemia, unspecified: Secondary | ICD-10-CM | POA: Diagnosis not present

## 2014-11-03 DIAGNOSIS — E1159 Type 2 diabetes mellitus with other circulatory complications: Secondary | ICD-10-CM | POA: Diagnosis not present

## 2014-11-03 DIAGNOSIS — E668 Other obesity: Secondary | ICD-10-CM | POA: Diagnosis not present

## 2014-11-03 DIAGNOSIS — I70213 Atherosclerosis of native arteries of extremities with intermittent claudication, bilateral legs: Secondary | ICD-10-CM | POA: Diagnosis not present

## 2014-11-03 DIAGNOSIS — I10 Essential (primary) hypertension: Secondary | ICD-10-CM | POA: Diagnosis not present

## 2014-11-03 DIAGNOSIS — E785 Hyperlipidemia, unspecified: Secondary | ICD-10-CM | POA: Diagnosis not present

## 2014-11-03 DIAGNOSIS — K219 Gastro-esophageal reflux disease without esophagitis: Secondary | ICD-10-CM

## 2014-11-03 DIAGNOSIS — J438 Other emphysema: Secondary | ICD-10-CM

## 2014-11-03 DIAGNOSIS — K269 Duodenal ulcer, unspecified as acute or chronic, without hemorrhage or perforation: Secondary | ICD-10-CM

## 2014-11-03 DIAGNOSIS — Z01810 Encounter for preprocedural cardiovascular examination: Secondary | ICD-10-CM | POA: Diagnosis not present

## 2014-11-03 DIAGNOSIS — I70219 Atherosclerosis of native arteries of extremities with intermittent claudication, unspecified extremity: Secondary | ICD-10-CM

## 2014-11-03 DIAGNOSIS — I6523 Occlusion and stenosis of bilateral carotid arteries: Secondary | ICD-10-CM | POA: Diagnosis not present

## 2014-11-03 DIAGNOSIS — E039 Hypothyroidism, unspecified: Secondary | ICD-10-CM | POA: Diagnosis not present

## 2014-11-03 NOTE — Progress Notes (Unsigned)
Patient ID: Brad [name], male   DOB: 1956-01-05, 59 y.o.   MRN: [id number]   [place], [place]  Date of visit:  11/03/2014 DOB:  11/03/1955    Age:  59 yrs. Medical record number:  [id number]     Account number:  [id number] Primary Care Provider: Serita Butcher ____________________________ CURRENT DIAGNOSES  1. Encounter for preprocedural cardiovascular examination  2. Occlusion And Stenosis Of Bilateral Carotid Arteries  3. Atherosclerosis Of Native Arteries Of Extremities With Intermittent Claudication, Bilateral Legs  4. Hyperlipidemia  5. Type 2 diabetes mellitus with other circulatory complications  6. Obesity  7. Anemia, Unspecified  8. Hypothyroidism  9. Hypertensive heart disease without heart failure ____________________________ ALLERGIES  Doxycycline, Hives and/or rash  Oxycodone, Itching of skin ____________________________ MEDICATIONS  1. albuterol sulfate 90 mcg/actuation breath activated powder inhaler, PRN  2. aspirin 81 mg chewable tablet, 1 p.o. daily  3. Zanaflex 4 mg tablet, PRN  4. tramadol 50 mg tablet, PRN  5. glipizide ER 2.5 mg tablet, extended release 24 hr, 1 p.o. daily  6. hydrocodone 5 mg-acetaminophen 325 mg tablet, PRN  7. hydrocodone-homatropine 5 mg-1.5 mg/5 mL syrup, PRN  8. metformin 1,000 mg tablet, 1 p.o. daily  9. Spiriva with HandiHaler 18 mcg and inhalation capsules, PRN  10. furosemide 40 mg tablet, BID  11. pantoprazole 40 mg tablet,delayed release, BID  12. amlodipine 5 mg tablet, 1 p.o. daily  13. iron 325 mg (65 mg iron) tablet, TID  14. lisinopril 5 mg tablet, 1 p.o. daily  15. simvastatin 40 mg tablet, 1 p.o. daily  16. levothyroxine 88 mcg tablet, 1 p.o. daily ____________________________ CHIEF COMPLAINTS  Surgical clearance carotid surg ____________________________ HISTORY OF PRESENT ILLNESS This 59 year old male is seen at the request of the vascular surgeons for preoperative cardiac evaluation. The patient has a  long-standing history of diabetes mellitus with both peripheral neuropathy and vascular complications, tobacco abuse and COPD, hypertensive heart disease and morbid obesity. He also has known hyperlipidemia. He had a stroke about a year ago and was recently hospitalized with a stroke and was found to have a severe left carotid artery stenosis. He is scheduled to have carotid artery surgery. The patient for 2 years has had limiting vascular claudication and has a low ABI particularly in his right leg but has bilateral claudication. He has been on disability because of a previous shoulder injury and surgery. He is inactive and continues to smoke around one and a half packs of cigarettes per day and has no desire to quit smoking despite being asked to do so. He has dyspnea with exertion and may have some vague chest discomfort when he exerts but it is difficult to tell if it is the sore with claudication. Surgery is scheduled for April 8. He denies PND orthopnea but does have a mild amount of edema. No recurrent TIAs. It is also pertinent that he was found to have significant anemia but recently in the hospital and had an upper endoscopy that showed duodenal ulcers. ____________________________ PAST HISTORY  Past Medical Illnesses:  hypertension, hyperlipidemia, GERD, COPD, DM-non-insulin dependent, obesity, peptic ulcer disease, anemia, history of CVA without deficits;  Cardiovascular Illnesses:  Carotid artery disease, mitral valve prolapse;  Surgical Procedures:  shoulder surgery multiple times  for infection;  NYHA Classification:  II;  Canadian Angina Classification:  Class 0: Asymptomatic;  Cardiology Procedures-Invasive:  no history of prior cardiac procedures;  Cardiology Procedures-Noninvasive:  echocardiogram, treadmill;  LVEF of 60% documented via echocardiogram  on 09/24/2014,   ____________________________ CARDIO-PULMONARY TEST DATES EKG Date:  11/03/2014;  Echocardiography Date: 09/24/2014;  Chest  Xray Date: 10/21/2014;   ____________________________ FAMILY HISTORY Brother -- Brother alive with problem, Diabetes mellitus, Glaucoma Brother -- Brother alive with problem, Hypertension Father -- Father dead, Malignant neoplasm of lung Mother -- Mother dead, Diabetes mellitus Sister -- Sister alive with problem, Cancer Sister -- Sister alive and well Sister -- Sister alive and well ____________________________ SOCIAL HISTORY Alcohol Use:  socially;  Smoking:  smokes 1-2 ppd, greater than 50 pack year history;  Diet:  regular diet;  Lifestyle:  lives with fiancee;  Exercise:  exercise is limited due to physical disability;  Occupation:  disabled Visual merchandiser;   ____________________________ REVIEW OF SYSTEMS General:  obesity  Integumentary:no rashes or new skin lesions. Eyes: denies diplopia, history of glaucoma or visual problems. Ears, Nose, Throat, Mouth:  denies any hearing loss, epistaxis, hoarseness or difficulty speaking. Respiratory: dyspnea with exertion Cardiovascular:  please review HPI Abdominal: history of duodenal ulcer disease Genitourinary-Male: frequency, hesitancy, nocturia, terminal dribbling, erectile dysfunction  Musculoskeletal:  claudication, chronic low back pain, lumbar disc disease Neurological:  headaches Psychiatric:  denies depression or anxiety  ____________________________ PHYSICAL EXAMINATION VITAL SIGNS  Blood Pressure:  190/78 Sitting, Right arm, regular cuff  , 182/70 Standing, Right arm and regular cuff   Pulse:  88/min. Weight:  271.00 lbs. Height:  71"BMI: 37  Constitutional:  pleasant white male in no acute distress, severely obese Skin:  warm and dry to touch, no apparent skin lesions, or masses noted. Head:  normocephalic, normal hair pattern, no masses or tenderness Eyes:  EOMS Intact, PERRLA, C and S clear, Funduscopic exam not done. ENT:  ears, nose and throat reveal no gross abnormalities.  Dentition good. Neck:  bilateral carotid bruits,  no JVD, no masses Chest:  normal symmetry, clear to auscultation. Cardiac:  regular rhythm, normal S1 and S2, No S3 or S4, no murmurs, gallops or rubs detected. Abdomen:  no masses, non-tender, severely obese Peripheral Pulses:  bilateral femoral bruits present, femoral pulses deep, reduced pedal pulses Extremities & Back:  1+ edema, mild bilateral venous insufficiency changes present Neurological:  no gross motor or sensory deficits noted, affect appropriate, oriented x3. ____________________________ MOST RECENT LIPID PANEL 09/24/14  CHOL TOTL 91 mg/dl, LDL 38 NM, HDL 28 mg/dl, TRIGLYCER 123 mg/dl and CHOL/HDL 3.3 (Calc) ____________________________ IMPRESSIONS/PLAN  1. From a cardiovascular viewpoint he is at increased risk for complications of anesthesia due to his smoking, obesity, multiple comorbidities, and anemia. 2. Peripheral vascular disease with limiting claudication 3. Type 2 diabetes with vascular disease 3. Carotid artery stenosis 4. Tobacco abuse disorder with COPD 5. Anemia likely due to recent duodenal ulcer disease 6. Hypertensive heart disease not well controlled 7. Obesity  Recommendations:  I would recommend that he have a myocardial perfusion scan done pharmacologically to be sure he is not at high cardiac risk for surgery. Thank you for asking to see him with you. We did discuss the importance of tobacco cessation but he is not willing to do so at this time. Twelve-lead EKG shows an IV conduction delay, possible old anteroseptal infarction although recent echo was normal. ____________________________ TODAYS ORDERS  1. 12 Lead EKG: Today  2. Lexiscan 1 day: First Available                       ____________________________ Cardiology Physician:  Kerry Hough MD [facility]

## 2014-11-05 DIAGNOSIS — I6523 Occlusion and stenosis of bilateral carotid arteries: Secondary | ICD-10-CM | POA: Diagnosis not present

## 2014-11-05 DIAGNOSIS — D649 Anemia, unspecified: Secondary | ICD-10-CM | POA: Diagnosis not present

## 2014-11-05 DIAGNOSIS — Z01810 Encounter for preprocedural cardiovascular examination: Secondary | ICD-10-CM | POA: Diagnosis not present

## 2014-11-05 DIAGNOSIS — E039 Hypothyroidism, unspecified: Secondary | ICD-10-CM | POA: Diagnosis not present

## 2014-11-05 DIAGNOSIS — E785 Hyperlipidemia, unspecified: Secondary | ICD-10-CM | POA: Diagnosis not present

## 2014-11-05 DIAGNOSIS — I70213 Atherosclerosis of native arteries of extremities with intermittent claudication, bilateral legs: Secondary | ICD-10-CM | POA: Diagnosis not present

## 2014-11-05 DIAGNOSIS — E668 Other obesity: Secondary | ICD-10-CM | POA: Diagnosis not present

## 2014-11-05 DIAGNOSIS — I119 Hypertensive heart disease without heart failure: Secondary | ICD-10-CM | POA: Diagnosis not present

## 2014-11-05 DIAGNOSIS — E1159 Type 2 diabetes mellitus with other circulatory complications: Secondary | ICD-10-CM | POA: Diagnosis not present

## 2014-11-06 ENCOUNTER — Other Ambulatory Visit: Payer: Self-pay | Admitting: Internal Medicine

## 2014-11-11 ENCOUNTER — Encounter: Payer: Self-pay | Admitting: Dentistry

## 2014-11-11 ENCOUNTER — Other Ambulatory Visit: Payer: Self-pay

## 2014-11-11 ENCOUNTER — Telehealth: Payer: Self-pay

## 2014-11-11 NOTE — Telephone Encounter (Signed)
rec'd message from [employer], PA of low hgb. of 7.8.  Noted per PCP note of 3/28, pt's hgb of 7.8 was "stable to improved".  Pt. was advised to go to the Short Stay Dept. 4/7 for CBC and BMET.  Pt. refused to go in for the labs on 4/7, because he said he just had lab work done.  Sent message to Dr. Trula Slade, making him aware of low pre-op Hgb.  Pt. Is scheduled for Left CEA 4/8.

## 2014-11-12 ENCOUNTER — Ambulatory Visit: Payer: Self-pay | Admitting: Neurology

## 2014-11-12 ENCOUNTER — Encounter (HOSPITAL_COMMUNITY): Payer: Self-pay | Admitting: *Deleted

## 2014-11-12 MED ORDER — SODIUM CHLORIDE 0.9 % IV SOLN: INTRAVENOUS | Status: DC

## 2014-11-12 MED ORDER — CHLORHEXIDINE GLUCONATE 4 % EX LIQD
60.0000 mL | Freq: Once | CUTANEOUS | Status: DC
  Filled 2014-11-12: qty 60

## 2014-11-12 MED ORDER — DEXTROSE 5 % IV SOLN
1.5000 g | INTRAVENOUS | Status: AC
  Administered 2014-11-13: 1.5 g via INTRAVENOUS
  Filled 2014-11-12: qty 1.5

## 2014-11-12 NOTE — Telephone Encounter (Signed)
Discussed Hgb. 7.8 with Dr. Trula Slade.  Recommended to proceed with plan for CEA on 4/8, based on last PCP note of 3/28 that Hgb is "stable and improved."  Per Dr. Trula Slade, only T & S his blood ; stated it is not necessary to type and crossmatch 2 units.

## 2014-11-13 ENCOUNTER — Inpatient Hospital Stay (HOSPITAL_COMMUNITY): Payer: Medicare Other | Admitting: Vascular Surgery

## 2014-11-13 ENCOUNTER — Encounter (HOSPITAL_COMMUNITY)
Admission: RE | Disposition: A | Discharge status: Home / Self Care | Payer: Self-pay | Source: Ambulatory Visit | Attending: Surgery

## 2014-11-13 ENCOUNTER — Encounter: Payer: Self-pay | Admitting: Neurology

## 2014-11-13 ENCOUNTER — Inpatient Hospital Stay (HOSPITAL_COMMUNITY)
Admission: RE | Admit: 2014-11-13 | Discharge: 2014-11-14 | DRG: 038 | Disposition: A | Discharge status: Home / Self Care | Payer: Medicare Other | Source: Ambulatory Visit | Attending: Surgery | Admitting: Surgery

## 2014-11-13 DIAGNOSIS — R29810 Facial weakness: Secondary | ICD-10-CM | POA: Diagnosis not present

## 2014-11-13 DIAGNOSIS — K219 Gastro-esophageal reflux disease without esophagitis: Secondary | ICD-10-CM | POA: Diagnosis present

## 2014-11-13 DIAGNOSIS — G8193 Hemiplegia, unspecified affecting right nondominant side: Secondary | ICD-10-CM | POA: Diagnosis present

## 2014-11-13 DIAGNOSIS — G629 Polyneuropathy, unspecified: Secondary | ICD-10-CM | POA: Diagnosis present

## 2014-11-13 DIAGNOSIS — E785 Hyperlipidemia, unspecified: Secondary | ICD-10-CM | POA: Diagnosis present

## 2014-11-13 DIAGNOSIS — H547 Unspecified visual loss: Secondary | ICD-10-CM | POA: Diagnosis present

## 2014-11-13 DIAGNOSIS — D649 Anemia, unspecified: Secondary | ICD-10-CM | POA: Diagnosis present

## 2014-11-13 DIAGNOSIS — Z7982 Long term (current) use of aspirin: Secondary | ICD-10-CM

## 2014-11-13 DIAGNOSIS — Z8711 Personal history of peptic ulcer disease: Secondary | ICD-10-CM

## 2014-11-13 DIAGNOSIS — G4700 Insomnia, unspecified: Secondary | ICD-10-CM | POA: Diagnosis present

## 2014-11-13 DIAGNOSIS — I341 Nonrheumatic mitral (valve) prolapse: Secondary | ICD-10-CM | POA: Diagnosis present

## 2014-11-13 DIAGNOSIS — F329 Major depressive disorder, single episode, unspecified: Secondary | ICD-10-CM | POA: Diagnosis present

## 2014-11-13 DIAGNOSIS — J449 Chronic obstructive pulmonary disease, unspecified: Secondary | ICD-10-CM | POA: Diagnosis present

## 2014-11-13 DIAGNOSIS — E119 Type 2 diabetes mellitus without complications: Secondary | ICD-10-CM | POA: Diagnosis present

## 2014-11-13 DIAGNOSIS — M545 Low back pain: Secondary | ICD-10-CM | POA: Diagnosis present

## 2014-11-13 DIAGNOSIS — M1990 Unspecified osteoarthritis, unspecified site: Secondary | ICD-10-CM | POA: Diagnosis present

## 2014-11-13 DIAGNOSIS — I6523 Occlusion and stenosis of bilateral carotid arteries: Secondary | ICD-10-CM | POA: Diagnosis not present

## 2014-11-13 DIAGNOSIS — Z888 Allergy status to other drugs, medicaments and biological substances status: Secondary | ICD-10-CM | POA: Diagnosis not present

## 2014-11-13 DIAGNOSIS — Z6838 Body mass index (BMI) 38.0-38.9, adult: Secondary | ICD-10-CM

## 2014-11-13 DIAGNOSIS — G8929 Other chronic pain: Secondary | ICD-10-CM | POA: Diagnosis present

## 2014-11-13 DIAGNOSIS — I6522 Occlusion and stenosis of left carotid artery: Secondary | ICD-10-CM | POA: Diagnosis not present

## 2014-11-13 DIAGNOSIS — I6529 Occlusion and stenosis of unspecified carotid artery: Secondary | ICD-10-CM | POA: Diagnosis present

## 2014-11-13 DIAGNOSIS — I10 Essential (primary) hypertension: Secondary | ICD-10-CM | POA: Diagnosis present

## 2014-11-13 DIAGNOSIS — Z8673 Personal history of transient ischemic attack (TIA), and cerebral infarction without residual deficits: Secondary | ICD-10-CM

## 2014-11-13 DIAGNOSIS — E039 Hypothyroidism, unspecified: Secondary | ICD-10-CM | POA: Diagnosis present

## 2014-11-13 DIAGNOSIS — F17210 Nicotine dependence, cigarettes, uncomplicated: Secondary | ICD-10-CM | POA: Diagnosis present

## 2014-11-13 HISTORY — PX: ENDARTERECTOMY: SHX5162

## 2014-11-13 LAB — POCT I-STAT 7, (LYTES, BLD GAS, ICA,H+H)
Acid-Base Excess: 1 mmol/L (ref 0.0–2.0)
Bicarbonate: 26.3 meq/L — ABNORMAL HIGH (ref 20.0–24.0)
Calcium, Ion: 1.19 mmol/L (ref 1.12–1.23)
HCT: 29 % — ABNORMAL LOW (ref 39.0–52.0)
Hemoglobin: 9.9 g/dL — ABNORMAL LOW (ref 13.0–17.0)
O2 Saturation: 96 %
Patient temperature: 37
Potassium: 4.5 mmol/L (ref 3.5–5.1)
Sodium: 136 mmol/L (ref 135–145)
TCO2: 28 mmol/L (ref 0–100)
pCO2 arterial: 42 mmHg (ref 35.0–45.0)
pH, Arterial: 7.406 (ref 7.350–7.450)
pO2, Arterial: 79 mmHg — ABNORMAL LOW (ref 80.0–100.0)

## 2014-11-13 LAB — URINALYSIS, ROUTINE W REFLEX MICROSCOPIC
Bilirubin Urine: NEGATIVE
Glucose, UA: 100 mg/dL — AB
Ketones, ur: NEGATIVE mg/dL
Leukocytes, UA: NEGATIVE
Nitrite: NEGATIVE
Protein, ur: >300 mg/dL — AB
Specific Gravity, Urine: 1.018 (ref 1.005–1.030)
Urobilinogen, UA: 0.2 mg/dL (ref 0.0–1.0)
pH: 5.5 (ref 5.0–8.0)

## 2014-11-13 LAB — URINE MICROSCOPIC-ADD ON

## 2014-11-13 LAB — GLUCOSE, CAPILLARY
Glucose-Capillary: 204 mg/dL — ABNORMAL HIGH (ref 70–99)
Glucose-Capillary: 173 mg/dL — ABNORMAL HIGH (ref 70–99)
Glucose-Capillary: 142 mg/dL — ABNORMAL HIGH (ref 70–99)
Glucose-Capillary: 160 mg/dL — ABNORMAL HIGH (ref 70–99)
Glucose-Capillary: 159 mg/dL — ABNORMAL HIGH (ref 70–99)

## 2014-11-13 LAB — COMPREHENSIVE METABOLIC PANEL
ALT: 29 U/L (ref 0–53)
AST: 34 U/L (ref 0–37)
Albumin: 2.7 g/dL — ABNORMAL LOW (ref 3.5–5.2)
Alkaline Phosphatase: 134 U/L — ABNORMAL HIGH (ref 39–117)
Anion gap: 5 (ref 5–15)
BUN: 17 mg/dL (ref 6–23)
CO2: 27 mmol/L (ref 19–32)
Calcium: 8.6 mg/dL (ref 8.4–10.5)
Chloride: 102 mmol/L (ref 96–112)
Creatinine, Ser: 0.94 mg/dL (ref 0.50–1.35)
GFR calc Af Amer: >90 mL/min (ref >90)
GFR calc non Af Amer: >90 mL/min (ref >90)
Glucose, Bld: 206 mg/dL — ABNORMAL HIGH (ref 70–99)
Potassium: 4.5 mmol/L (ref 3.5–5.1)
Sodium: 134 mmol/L — ABNORMAL LOW (ref 135–145)
Total Bilirubin: 0.4 mg/dL (ref 0.3–1.2)
Total Protein: 6.2 g/dL (ref 6.0–8.3)

## 2014-11-13 LAB — TYPE AND SCREEN
ABO/RH(D): B POS
Antibody Screen: NEGATIVE

## 2014-11-13 LAB — PROTIME-INR
INR: 1.09 (ref 0.00–1.49)
Prothrombin Time: 14.2 seconds (ref 11.6–15.2)

## 2014-11-13 LAB — APTT: aPTT: 30 seconds (ref 24–37)

## 2014-11-13 SURGERY — ENDARTERECTOMY, CAROTID
Anesthesia: General | Site: Neck | Laterality: Left

## 2014-11-13 MED ORDER — MIDAZOLAM HCL 2 MG/2ML IJ SOLN: 0.5000 mg | Freq: Once | INTRAMUSCULAR | Status: DC | PRN

## 2014-11-13 MED ORDER — PROMETHAZINE HCL 25 MG/ML IJ SOLN: 6.2500 mg | INTRAMUSCULAR | Status: DC | PRN

## 2014-11-13 MED ORDER — PHENOL 1.4 % MT LIQD: 1.0000 | OROMUCOSAL | Status: DC | PRN

## 2014-11-13 MED ORDER — DOCUSATE SODIUM 100 MG PO CAPS
100.0000 mg | ORAL_CAPSULE | Freq: Every day | ORAL | Status: DC
Start: 2014-11-14 — End: 2014-11-14

## 2014-11-13 MED ORDER — HEMOSTATIC AGENTS (NO CHARGE) OPTIME
TOPICAL | Status: DC | PRN
  Administered 2014-11-13: 1 via TOPICAL

## 2014-11-13 MED ORDER — GLYCOPYRROLATE 0.2 MG/ML IJ SOLN
INTRAMUSCULAR | Status: AC
  Filled 2014-11-13: qty 2

## 2014-11-13 MED ORDER — ALBUTEROL SULFATE (2.5 MG/3ML) 0.083% IN NEBU: 3.0000 mL | INHALATION_SOLUTION | Freq: Four times a day (QID) | RESPIRATORY_TRACT | Status: DC | PRN

## 2014-11-13 MED ORDER — PROPOFOL 10 MG/ML IV BOLUS
INTRAVENOUS | Status: AC
  Filled 2014-11-13: qty 20

## 2014-11-13 MED ORDER — FUROSEMIDE 40 MG PO TABS
40.0000 mg | ORAL_TABLET | Freq: Every day | ORAL | Status: DC
  Filled 2014-11-13: qty 1

## 2014-11-13 MED ORDER — DIPHENHYDRAMINE HCL 25 MG PO CAPS: 25.0000 mg | ORAL_CAPSULE | Freq: Four times a day (QID) | ORAL | Status: DC | PRN

## 2014-11-13 MED ORDER — LIDOCAINE HCL (PF) 1 % IJ SOLN
INTRAMUSCULAR | Status: DC | PRN
  Administered 2014-11-13: 1 mL via INTRADERMAL

## 2014-11-13 MED ORDER — ROCURONIUM BROMIDE 50 MG/5ML IV SOLN
INTRAVENOUS | Status: AC
  Filled 2014-11-13: qty 1

## 2014-11-13 MED ORDER — AMLODIPINE BESYLATE 5 MG PO TABS
5.0000 mg | ORAL_TABLET | Freq: Every day | ORAL | Status: DC
  Filled 2014-11-13: qty 1

## 2014-11-13 MED ORDER — FERROUS SULFATE 325 (65 FE) MG PO TABS
325.0000 mg | ORAL_TABLET | Freq: Three times a day (TID) | ORAL | Status: DC
Start: 2014-11-13 — End: 2014-11-14
  Administered 2014-11-13: 325 mg via ORAL
  Filled 2014-11-13 (×5): qty 1

## 2014-11-13 MED ORDER — NEOSTIGMINE METHYLSULFATE 10 MG/10ML IV SOLN
INTRAVENOUS | Status: AC
  Filled 2014-11-13: qty 1

## 2014-11-13 MED ORDER — LISINOPRIL 20 MG PO TABS
20.0000 mg | ORAL_TABLET | Freq: Every day | ORAL | Status: DC
  Filled 2014-11-13 (×2): qty 1

## 2014-11-13 MED ORDER — MUPIROCIN 2 % EX OINT
1.0000 "application " | TOPICAL_OINTMENT | Freq: Two times a day (BID) | CUTANEOUS | Status: DC
  Administered 2014-11-13: 1 via NASAL
  Filled 2014-11-13: qty 22

## 2014-11-13 MED ORDER — LEVOTHYROXINE SODIUM 88 MCG PO TABS
88.0000 ug | ORAL_TABLET | Freq: Every day | ORAL | Status: DC
  Filled 2014-11-13 (×2): qty 1

## 2014-11-13 MED ORDER — NEOSTIGMINE METHYLSULFATE 10 MG/10ML IV SOLN
INTRAVENOUS | Status: DC | PRN
  Administered 2014-11-13: 4 mg via INTRAVENOUS

## 2014-11-13 MED ORDER — SODIUM CHLORIDE 0.9 % IR SOLN
Status: DC | PRN
  Administered 2014-11-13: 07:00:00

## 2014-11-13 MED ORDER — SIMVASTATIN 40 MG PO TABS
40.0000 mg | ORAL_TABLET | Freq: Every day | ORAL | Status: DC
  Administered 2014-11-13: 40 mg via ORAL
  Filled 2014-11-13 (×2): qty 1

## 2014-11-13 MED ORDER — ACETAMINOPHEN 650 MG RE SUPP
325.0000 mg | RECTAL | Status: DC | PRN
Start: 2014-11-13 — End: 2014-11-14

## 2014-11-13 MED ORDER — ASPIRIN EC 325 MG PO TBEC
325.0000 mg | DELAYED_RELEASE_TABLET | Freq: Every day | ORAL | Status: DC
  Administered 2014-11-13: 325 mg via ORAL
  Filled 2014-11-13: qty 1

## 2014-11-13 MED ORDER — LABETALOL HCL 5 MG/ML IV SOLN: 10.0000 mg | INTRAVENOUS | Status: DC | PRN

## 2014-11-13 MED ORDER — TIOTROPIUM BROMIDE MONOHYDRATE 18 MCG IN CAPS: 18.0000 ug | ORAL_CAPSULE | Freq: Two times a day (BID) | RESPIRATORY_TRACT | Status: DC | PRN

## 2014-11-13 MED ORDER — ARTIFICIAL TEARS OP OINT
TOPICAL_OINTMENT | OPHTHALMIC | Status: AC
Start: 2014-11-13 — End: 2014-11-13
  Filled 2014-11-13: qty 3.5

## 2014-11-13 MED ORDER — LACTATED RINGERS IV SOLN
INTRAVENOUS | Status: DC | PRN
  Administered 2014-11-13 (×2): via INTRAVENOUS

## 2014-11-13 MED ORDER — FENTANYL CITRATE 0.05 MG/ML IJ SOLN
INTRAMUSCULAR | Status: AC
  Filled 2014-11-13: qty 5

## 2014-11-13 MED ORDER — MIDAZOLAM HCL 5 MG/5ML IJ SOLN
INTRAMUSCULAR | Status: DC | PRN
  Administered 2014-11-13: 2 mg via INTRAVENOUS

## 2014-11-13 MED ORDER — NORTRIPTYLINE HCL 10 MG PO CAPS
10.0000 mg | ORAL_CAPSULE | Freq: Every day | ORAL | Status: DC
  Administered 2014-11-13: 10 mg via ORAL
  Filled 2014-11-13 (×2): qty 1

## 2014-11-13 MED ORDER — PHENYLEPHRINE 40 MCG/ML (10ML) SYRINGE FOR IV PUSH (FOR BLOOD PRESSURE SUPPORT)
PREFILLED_SYRINGE | INTRAVENOUS | Status: AC
  Filled 2014-11-13: qty 20

## 2014-11-13 MED ORDER — GLYCOPYRROLATE 0.2 MG/ML IJ SOLN
INTRAMUSCULAR | Status: DC | PRN
  Administered 2014-11-13: .2 mg via INTRAVENOUS
  Administered 2014-11-13: .6 mg via INTRAVENOUS

## 2014-11-13 MED ORDER — FENTANYL CITRATE 0.05 MG/ML IJ SOLN
25.0000 ug | INTRAMUSCULAR | Status: DC | PRN
Start: 2014-11-13 — End: 2014-11-14
  Administered 2014-11-13 (×3): 25 ug via INTRAVENOUS
  Filled 2014-11-13 (×3): qty 2

## 2014-11-13 MED ORDER — POTASSIUM CHLORIDE CRYS ER 20 MEQ PO TBCR: 20.0000 meq | EXTENDED_RELEASE_TABLET | Freq: Every day | ORAL | Status: DC | PRN

## 2014-11-13 MED ORDER — PROTAMINE SULFATE 10 MG/ML IV SOLN
INTRAVENOUS | Status: AC
  Filled 2014-11-13: qty 5

## 2014-11-13 MED ORDER — SODIUM CHLORIDE 0.9 % IV SOLN: 500.0000 mL | Freq: Once | INTRAVENOUS | Status: AC | PRN

## 2014-11-13 MED ORDER — GUAIFENESIN-DM 100-10 MG/5ML PO SYRP: 15.0000 mL | ORAL_SOLUTION | ORAL | Status: DC | PRN

## 2014-11-13 MED ORDER — OXYCODONE-ACETAMINOPHEN 5-325 MG PO TABS
1.0000 | ORAL_TABLET | ORAL | Status: DC | PRN
  Administered 2014-11-13 (×2): 2 via ORAL
  Filled 2014-11-13 (×2): qty 2

## 2014-11-13 MED ORDER — MEPERIDINE HCL 25 MG/ML IJ SOLN
6.2500 mg | INTRAMUSCULAR | Status: DC | PRN
Start: 2014-11-13 — End: 2014-11-13

## 2014-11-13 MED ORDER — GLUCOSAMINE-CHONDROITIN 500-400 MG PO TABS: 2.0000 | ORAL_TABLET | Freq: Every day | ORAL | Status: DC

## 2014-11-13 MED ORDER — FENTANYL CITRATE 0.05 MG/ML IJ SOLN
INTRAMUSCULAR | Status: DC | PRN
  Administered 2014-11-13: 25 ug via INTRAVENOUS
  Administered 2014-11-13: 150 ug via INTRAVENOUS
  Administered 2014-11-13: 25 ug via INTRAVENOUS
  Administered 2014-11-13 (×2): 50 ug via INTRAVENOUS

## 2014-11-13 MED ORDER — MAGNESIUM SULFATE 2 GM/50ML IV SOLN: 2.0000 g | Freq: Every day | INTRAVENOUS | Status: DC | PRN

## 2014-11-13 MED ORDER — LABETALOL HCL 5 MG/ML IV SOLN
INTRAVENOUS | Status: DC | PRN
  Administered 2014-11-13: 5 mg via INTRAVENOUS

## 2014-11-13 MED ORDER — ONDANSETRON HCL 4 MG/2ML IJ SOLN
INTRAMUSCULAR | Status: DC | PRN
  Administered 2014-11-13: 4 mg via INTRAVENOUS

## 2014-11-13 MED ORDER — HYDROMORPHONE HCL 1 MG/ML IJ SOLN
0.2500 mg | INTRAMUSCULAR | Status: DC | PRN
  Administered 2014-11-13 (×4): 0.5 mg via INTRAVENOUS

## 2014-11-13 MED ORDER — CETYLPYRIDINIUM CHLORIDE 0.05 % MT LIQD
7.0000 mL | Freq: Two times a day (BID) | OROMUCOSAL | Status: DC
  Administered 2014-11-13: 7 mL via OROMUCOSAL

## 2014-11-13 MED ORDER — MIDAZOLAM HCL 2 MG/2ML IJ SOLN
INTRAMUSCULAR | Status: AC
  Filled 2014-11-13: qty 2

## 2014-11-13 MED ORDER — SUCCINYLCHOLINE CHLORIDE 20 MG/ML IJ SOLN
INTRAMUSCULAR | Status: AC
  Filled 2014-11-13: qty 1

## 2014-11-13 MED ORDER — PROPOFOL 10 MG/ML IV BOLUS
INTRAVENOUS | Status: DC | PRN
  Administered 2014-11-13: 150 mg via INTRAVENOUS

## 2014-11-13 MED ORDER — SODIUM CHLORIDE 0.9 % IJ SOLN
INTRAMUSCULAR | Status: AC
  Filled 2014-11-13: qty 20

## 2014-11-13 MED ORDER — TIOTROPIUM BROMIDE MONOHYDRATE 18 MCG IN CAPS
18.0000 ug | ORAL_CAPSULE | Freq: Every day | RESPIRATORY_TRACT | Status: DC
  Filled 2014-11-13: qty 5

## 2014-11-13 MED ORDER — ROCURONIUM BROMIDE 100 MG/10ML IV SOLN
INTRAVENOUS | Status: DC | PRN
  Administered 2014-11-13: 50 mg via INTRAVENOUS

## 2014-11-13 MED ORDER — EPHEDRINE SULFATE 50 MG/ML IJ SOLN
INTRAMUSCULAR | Status: AC
  Filled 2014-11-13: qty 2

## 2014-11-13 MED ORDER — OXYCODONE-ACETAMINOPHEN 5-325 MG PO TABS: 1.0000 | ORAL_TABLET | Freq: Four times a day (QID) | ORAL | Status: DC | PRN

## 2014-11-13 MED ORDER — 0.9 % SODIUM CHLORIDE (POUR BTL) OPTIME
TOPICAL | Status: DC | PRN
  Administered 2014-11-13: 2000 mL

## 2014-11-13 MED ORDER — HYDRALAZINE HCL 20 MG/ML IJ SOLN: 5.0000 mg | INTRAMUSCULAR | Status: DC | PRN

## 2014-11-13 MED ORDER — TIZANIDINE HCL 4 MG PO TABS
8.0000 mg | ORAL_TABLET | Freq: Four times a day (QID) | ORAL | Status: DC | PRN
Start: 2014-11-13 — End: 2014-11-14
  Filled 2014-11-13: qty 2

## 2014-11-13 MED ORDER — ACETAMINOPHEN 325 MG PO TABS: 325.0000 mg | ORAL_TABLET | ORAL | Status: DC | PRN

## 2014-11-13 MED ORDER — HYDROMORPHONE HCL 1 MG/ML IJ SOLN
INTRAMUSCULAR | Status: AC
  Filled 2014-11-13: qty 1

## 2014-11-13 MED ORDER — EPHEDRINE SULFATE 50 MG/ML IJ SOLN
INTRAMUSCULAR | Status: DC | PRN
  Administered 2014-11-13: 5 mg via INTRAVENOUS
  Administered 2014-11-13: 2.5 mg via INTRAVENOUS
  Administered 2014-11-13 (×3): 5 mg via INTRAVENOUS

## 2014-11-13 MED ORDER — LIDOCAINE HCL (CARDIAC) 20 MG/ML IV SOLN
INTRAVENOUS | Status: AC
  Filled 2014-11-13: qty 15

## 2014-11-13 MED ORDER — PROTAMINE SULFATE 10 MG/ML IV SOLN
INTRAVENOUS | Status: DC | PRN
  Administered 2014-11-13: 5 mg via INTRAVENOUS
  Administered 2014-11-13: 25 mg via INTRAVENOUS
  Administered 2014-11-13: 20 mg via INTRAVENOUS

## 2014-11-13 MED ORDER — ONDANSETRON HCL 4 MG/2ML IJ SOLN: 4.0000 mg | Freq: Four times a day (QID) | INTRAMUSCULAR | Status: DC | PRN

## 2014-11-13 MED ORDER — LIDOCAINE HCL (CARDIAC) 20 MG/ML IV SOLN
INTRAVENOUS | Status: DC | PRN
  Administered 2014-11-13: 30 mg via INTRAVENOUS

## 2014-11-13 MED ORDER — METOPROLOL TARTRATE 1 MG/ML IV SOLN: 2.0000 mg | INTRAVENOUS | Status: DC | PRN

## 2014-11-13 MED ORDER — LIDOCAINE HCL (PF) 1 % IJ SOLN
INTRAMUSCULAR | Status: AC
  Filled 2014-11-13: qty 30

## 2014-11-13 MED ORDER — HEPARIN SODIUM (PORCINE) 1000 UNIT/ML IJ SOLN
INTRAMUSCULAR | Status: AC
  Filled 2014-11-13: qty 2

## 2014-11-13 MED ORDER — INSULIN ASPART 100 UNIT/ML ~~LOC~~ SOLN
0.0000 [IU] | Freq: Three times a day (TID) | SUBCUTANEOUS | Status: DC
  Administered 2014-11-13: 3 [IU] via SUBCUTANEOUS

## 2014-11-13 MED ORDER — HEPARIN SODIUM (PORCINE) 1000 UNIT/ML IJ SOLN
INTRAMUSCULAR | Status: DC | PRN
  Administered 2014-11-13: 11000 [IU] via INTRAVENOUS

## 2014-11-13 MED ORDER — ALBUTEROL SULFATE (2.5 MG/3ML) 0.083% IN NEBU
3.0000 mL | INHALATION_SOLUTION | RESPIRATORY_TRACT | Status: DC
  Administered 2014-11-13: 3 mL via RESPIRATORY_TRACT
  Filled 2014-11-13: qty 3

## 2014-11-13 MED ORDER — PANTOPRAZOLE SODIUM 40 MG PO TBEC
40.0000 mg | DELAYED_RELEASE_TABLET | Freq: Two times a day (BID) | ORAL | Status: DC
  Administered 2014-11-13 (×2): 40 mg via ORAL
  Filled 2014-11-13 (×2): qty 1

## 2014-11-13 MED ORDER — ONDANSETRON HCL 4 MG/2ML IJ SOLN
INTRAMUSCULAR | Status: AC
  Filled 2014-11-13: qty 4

## 2014-11-13 MED ORDER — PHENYLEPHRINE HCL 10 MG/ML IJ SOLN
INTRAMUSCULAR | Status: DC | PRN
  Administered 2014-11-13 (×3): 40 ug via INTRAVENOUS

## 2014-11-13 MED ORDER — LIDOCAINE HCL 4 % MT SOLN
OROMUCOSAL | Status: DC | PRN
  Administered 2014-11-13: 4 mL via TOPICAL

## 2014-11-13 MED ORDER — PHENYLEPHRINE HCL 10 MG/ML IJ SOLN
10.0000 mg | INTRAVENOUS | Status: DC | PRN
  Administered 2014-11-13: 10 ug/min via INTRAVENOUS

## 2014-11-13 MED ORDER — ALUM & MAG HYDROXIDE-SIMETH 200-200-20 MG/5ML PO SUSP: 15.0000 mL | ORAL | Status: DC | PRN

## 2014-11-13 MED ORDER — DEXTROSE 5 % IV SOLN
1.5000 g | Freq: Two times a day (BID) | INTRAVENOUS | Status: DC
  Administered 2014-11-13: 1.5 g via INTRAVENOUS
  Filled 2014-11-13 (×2): qty 1.5

## 2014-11-13 MED ORDER — CHLORHEXIDINE GLUCONATE CLOTH 2 % EX PADS
6.0000 | MEDICATED_PAD | Freq: Every day | CUTANEOUS | Status: DC
  Administered 2014-11-13: 6 via TOPICAL

## 2014-11-13 SURGICAL SUPPLY — 44 items
CANISTER SUCTION 2500CC (MISCELLANEOUS) ×2 IMPLANT
CATH ROBINSON RED A/P 18FR (CATHETERS) ×2 IMPLANT
CATH SUCT 10FR WHISTLE TIP (CATHETERS) ×2 IMPLANT
CLIP TI MEDIUM 6 (CLIP) ×2 IMPLANT
CLIP TI WIDE RED SMALL 24 (CLIP) ×1 IMPLANT
CLIP TI WIDE RED SMALL 6 (CLIP) ×2 IMPLANT
CRADLE DONUT ADULT HEAD (MISCELLANEOUS) ×2 IMPLANT
DRAIN CHANNEL 15F RND FF W/TCR (WOUND CARE) IMPLANT
ELECT REM PT RETURN 9FT ADLT (ELECTROSURGICAL) ×2
ELECTRODE REM PT RTRN 9FT ADLT (ELECTROSURGICAL) ×1 IMPLANT
EVACUATOR SILICONE 100CC (DRAIN) IMPLANT
GAUZE SPONGE 4X4 12PLY STRL (GAUZE/BANDAGES/DRESSINGS) ×2 IMPLANT
GLOVE BIOGEL PI IND STRL 7.5 (GLOVE) ×1 IMPLANT
GLOVE BIOGEL PI INDICATOR 7.5 (GLOVE) ×1
GLOVE SURG SS PI 7.5 STRL IVOR (GLOVE) ×2 IMPLANT
GOWN STRL REUS W/ TWL LRG LVL3 (GOWN DISPOSABLE) ×2 IMPLANT
GOWN STRL REUS W/ TWL XL LVL3 (GOWN DISPOSABLE) ×1 IMPLANT
GOWN STRL REUS W/TWL LRG LVL3 (GOWN DISPOSABLE) ×4
GOWN STRL REUS W/TWL XL LVL3 (GOWN DISPOSABLE) ×2
HEMOSTAT SNOW SURGICEL 2X4 (HEMOSTASIS) ×1 IMPLANT
INSERT FOGARTY SM (MISCELLANEOUS) IMPLANT
KIT BASIN OR (CUSTOM PROCEDURE TRAY) ×2 IMPLANT
KIT ROOM TURNOVER OR (KITS) ×2 IMPLANT
LIQUID BAND (GAUZE/BANDAGES/DRESSINGS) ×2 IMPLANT
NDL HYPO 25GX1X1/2 BEV (NEEDLE) IMPLANT
NEEDLE HYPO 25GX1X1/2 BEV (NEEDLE) ×2 IMPLANT
NS IRRIG 1000ML POUR BTL (IV SOLUTION) ×6 IMPLANT
PACK CAROTID (CUSTOM PROCEDURE TRAY) ×2 IMPLANT
PAD ARMBOARD 7.5X6 YLW CONV (MISCELLANEOUS) ×4 IMPLANT
PATCH VASCULAR VASCU GUARD 1X6 (Vascular Products) ×1 IMPLANT
SHUNT CAROTID BYPASS 10 (VASCULAR PRODUCTS) IMPLANT
SHUNT CAROTID BYPASS 12FRX15.5 (VASCULAR PRODUCTS) IMPLANT
SPONGE INTESTINAL PEANUT (DISPOSABLE) ×4 IMPLANT
SUT ETHILON 3 0 PS 1 (SUTURE) IMPLANT
SUT PROLENE 6 0 BV (SUTURE) ×3 IMPLANT
SUT PROLENE 7 0 BV 1 (SUTURE) IMPLANT
SUT PROLENE 7 0 BV1 MDA (SUTURE) ×1 IMPLANT
SUT SILK 3 0 TIES 17X18 (SUTURE)
SUT SILK 3-0 18XBRD TIE BLK (SUTURE) IMPLANT
SUT VIC AB 3-0 SH 27 (SUTURE) ×4
SUT VIC AB 3-0 SH 27X BRD (SUTURE) ×2 IMPLANT
SUT VICRYL 4-0 PS2 18IN ABS (SUTURE) ×2 IMPLANT
SYR CONTROL 10ML LL (SYRINGE) ×1 IMPLANT
WATER STERILE IRR 1000ML POUR (IV SOLUTION) ×2 IMPLANT

## 2014-11-13 NOTE — Progress Notes (Signed)
Pt arrived on floor, left facial droop with level 1 hematoma at Carotid site, PACU RN stated new, Elink notifed, Code Stroke called per protocal

## 2014-11-13 NOTE — Progress Notes (Signed)
Pt has new complaint of L side numbness on left portion of neck, through incision site and up toward L lower portion of face. [facility] RN called again in regards to new symptoms. Langley Gauss, RN with [facility] deferred to Dr. Leonel Ramsay- neurologist. Dr. Leonel Ramsay did not think it was neurological at this time and asked to consult with Dr. Trula Slade the surgeon in this case.

## 2014-11-13 NOTE — Progress Notes (Signed)
      S/P left CEA  Pain issues placed order for oxycodone 1-2 q 4 prn then added benadryl 1-2 PRN for itching.  No tongue deviation, slight left sided facial droop with active smile. Grip 5/5 Incision C/D/I  Theda Sers, Gemma Ruan MAUREEN PA-C

## 2014-11-13 NOTE — H&P (Signed)
Hospital Consult    Reason for Consult: Carotid artery stenosis Referring Physician: [name] MRN #: [id number]  History of Present Illness: This is a 59 y.o. male who we've been consulted on regarding carotid artery stenosis. Yesterday morning, the patient noticed acute onset of slurred speech and left sided weakness and left visual changes. His wife also noticed that he was dragging his left leg when he walked. He does have a hx of remote lacunar infarcts involving the right thalamus and left putamen based on MRI of brain performed on 02/28/2014. Doppler ultrasound performed on 03/01/2014 showed a left 60-79% ICA stenosis.   During his hospitalization in July 2015, he presented to the hospital with c/o dizziness, clumsiness, unsteady gait, facial droop, slurred speech, and right sided weakness. A statin was started at that time. Aspirin was changed to 81mg  daily since he was on Pletal for PAD followed by Dr. Trula Slade, which he was last seen November 2015. At that time, Dr. Trula Slade had reviewed his most recent dopplers and it was felt that the patient had a left brain stroke. He was seen and evaluated by neurology, and they felt that his stroke was secondary to intracranial pathology rather than extracranial left carotid stenosis. Left carotid endarterectomy versus stenting was not recommended at that time. He has been following the patient with surveillance q6 months.  Additionally, pt has a hx of tobacco abuse, type 2 diabetes, HTN, and carotid artery stenosis.   Past Medical History  Diagnosis Date  . Depression   . GERD (gastroesophageal reflux disease)   . Diabetes mellitus type II   . COPD (chronic obstructive pulmonary disease)   . Ventral hernia   . Chronic LBP   . Hyperlipidemia 08/27/2014  . Hypothyroidism 08/27/2014  . Carotid stenosis 08/27/2014    Past Surgical History  Procedure Laterality Date  . Neg stress test  2000    . Rotator cuff repair  2009    multiple f/u Sxs/infection Tx  . Incision and drainage perirectal abscess  03/2009    Allergies  Allergen Reactions  . Doxycycline Hives and Itching  . Lipitor [Atorvastatin] Other (See Comments)    Myalgia   . Oxycodone Itching    Prior to Admission medications   Medication Sig Start Date End Date Taking? Authorizing Provider  albuterol (PROVENTIL HFA;VENTOLIN HFA) 108 (90 BASE) MCG/ACT inhaler Inhale 2 puffs into the lungs every 6 (six) hours as needed for wheezing or shortness of breath. 10/10/13  Yes Biagio Borg, MD  aspirin EC 81 MG tablet Take 1 tablet (81 mg total) by mouth daily. 03/01/14  Yes Reyne Dumas, MD  Blood Pressure Monitor DEVI Use to check Blood pressure daily 05/25/14  Yes Biagio Borg, MD  cilostazol (PLETAL) 100 MG tablet TAKE 1 TABLET BY MOUTH TWICE DAILY 05/25/14  Yes Serafina Mitchell, MD  furosemide (LASIX) 40 MG tablet 1 tab by mouth in the AM, with a second dose in PM as needed only for swelling 08/27/14  Yes Biagio Borg, MD  glipiZIDE (GLIPIZIDE XL) 2.5 MG 24 hr tablet Take 1 tablet (2.5 mg total) by mouth daily with breakfast. As needed for blood sugar > 200 03/19/14  Yes Biagio Borg, MD  glucosamine-chondroitin 500-400 MG tablet Take 2 tablets by mouth daily.   Yes Historical Provider, MD  glucose blood test strip 1 each by Other route See admin instructions. Check blood sugar once daily.   Yes Historical Provider, MD  HYDROcodone-homatropine [facility]) 5-1.5 MG/5ML syrup  Take 5 mLs by mouth every 6 (six) hours as needed. Patient not taking: Reported on 09/23/2014 06/18/14   Biagio Borg, MD  ibuprofen (ADVIL,MOTRIN) 200 MG tablet Take 800 mg by mouth every 6 (six) hours as needed for mild pain or moderate pain.   Yes Historical Provider, MD  Lancets MISC 1 each by Other route See admin instructions. Check blood sugar once daily.    Yes Historical Provider, MD  levofloxacin (LEVAQUIN) 250 MG tablet Take 1 tablet (250 mg total) by mouth daily. Patient not taking: Reported on 09/23/2014 06/18/14   Biagio Borg, MD  levothyroxine (SYNTHROID, LEVOTHROID) 88 MCG tablet Take 1 tablet (88 mcg total) by mouth daily. 08/27/14  Yes Biagio Borg, MD  lisinopril (PRINIVIL,ZESTRIL) 20 MG tablet Take 1 tablet (20 mg total) by mouth daily. 05/25/14  Yes Rosalin Hawking, MD  metFORMIN (GLUCOPHAGE) 500 MG tablet Take 1,000 mg by mouth daily.   Yes Historical Provider, MD  nortriptyline (PAMELOR) 10 MG capsule Take 1 capsule (10 mg total) by mouth at bedtime. Patient not taking: Reported on 09/23/2014 10/10/13   Biagio Borg, MD  simvastatin (ZOCOR) 40 MG tablet Take 1 tablet (40 mg total) by mouth at bedtime. 08/27/14  Yes Biagio Borg, MD  tiotropium (SPIRIVA) 18 MCG inhalation capsule Place 18 mcg into inhaler and inhale 2 (two) times daily as needed (for wheezing).   Yes Historical Provider, MD  tiZANidine (ZANAFLEX) 4 MG tablet Take 8 mg by mouth every 6 (six) hours as needed for muscle spasms.   Yes Historical Provider, MD  traMADol Veatrice Bourbon) 50 MG tablet 1-2 tabs by mouth up to four times per day 08/27/14  Yes Biagio Borg, MD    History   Social History  . Marital Status: Single    Spouse Name: N/A  . Number of Children: 0  . Years of Education: N/A   Occupational History  . Disabled    Social History Main Topics  . Smoking status: Current Every Day Smoker -- 1.50 packs/day for 40 years    Types: Cigarettes  . Smokeless tobacco: Never Used     Comment: 1-2 pks per day  . Alcohol Use: Yes     Comment: Social  . Drug Use: No  . Sexual Activity: Not on file   Other Topics Concern  . Not on file   Social History Narrative   Divorced with fiance.   Disabled since June 2010 - right shoulder   No children.            Family History  Problem Relation Age of Onset  . Alcohol abuse Father   . Lung cancer Father   . Heart disease Mother     automated implantable cardioverter-defibrillator   . Diabetes type II Mother   . Lung cancer Maternal Grandmother   . Lung cancer Sister     ROS: [x]  Positive [ ]  Negative [ ]  All sytems reviewed and are negative  Cardiovascular: []  chest pain/pressure []  palpitations []  SOB lying flat []  DOE [x]  pain in legs while walking []  pain in legs at rest []  pain in legs at night []  non-healing ulcers []  hx of DVT []  swelling in legs  Pulmonary: []  productive cough []  asthma/wheezing []  home O2  Neurologic: [x]  weakness in [x]  arms [x]  legs []  numbness in []  arms []  legs [x]  hx of CVA []  mini stroke [x] difficulty speaking or slurred speech []  temporary loss of vision in one eye []   dizziness  Hematologic: []  hx of cancer []  bleeding problems []  problems with blood clotting easily  Endocrine:  [x]  diabetes []  thyroid disease  GI []  vomiting blood []  blood in stool  GU: []  CKD/renal failure []  HD--[]  M/W/F or []  T/T/S []  burning with urination []  blood in urine  Psychiatric: []  anxiety []  depression  Musculoskeletal: []  arthritis []  joint pain  Integumentary: []  rashes []  ulcers  Constitutional: []  fever []  chills   Physical Examination  Filed Vitals:   09/24/14 1420  BP: 122/68  Pulse: 70  Temp: 98.6 F (37 C)  Resp: 18   Body mass index is 37.67 kg/(m^2).  General: WDWN in NAD Gait: Not observed HENT: WNL, normocephalic Pulmonary: normal non-labored breathing, without rales, rhonchi, wheezing Cardiac: regular, without Murmurs, rubs or gallops; with right carotid bruit Abdomen: soft, NT/ND, no masses Skin: without rashes, without ulcers  Vascular Exam/Pulses: radial pulses palpable bilaterally. Palpable femoral pulses bilaterally. Non palpable pedal  pulses.  Extremities: without ischemic changes, without Gangrene , without cellulitis; without open wounds;  Musculoskeletal: no muscle wasting or atrophy Neurologic: A&O X 3; Appropriate Affect ;Speech is fluent/normal. EOMI. Tongue midline. No facial droop. Facial sensation intact. 5/5 grip strength bilaterally. 5/5 strength lower extremities.  Psychiatric: Judgment intact, Mood & affect appropriate for pt's clinical situation Lymph : No Cervical, Axillary, or Inguinal lymphadenopathy    CBC  Labs (Brief)       Component Value Date/Time   WBC 5.6 09/24/2014 0811   RBC 4.37 09/24/2014 0811   RBC 4.15* 09/23/2014 2300   HGB 8.5* 09/24/2014 0811   HCT 29.3* 09/24/2014 0811   PLT 242 09/24/2014 0811   MCV 67.0* 09/24/2014 0811   MCH 19.5* 09/24/2014 0811   MCHC 29.0* 09/24/2014 0811   RDW 18.5* 09/24/2014 0811   LYMPHSABS 1.4 09/23/2014 0950   MONOABS 0.8 09/23/2014 0950   EOSABS 0.2 09/23/2014 0950   BASOSABS 0.1 09/23/2014 0950      BMET  Labs (Brief)       Component Value Date/Time   NA 136 09/24/2014 0811   K 3.9 09/24/2014 0811   CL 102 09/24/2014 0811   CO2 28 09/24/2014 0811   GLUCOSE 207* 09/24/2014 0811   BUN 13 09/24/2014 0811   CREATININE 0.83 09/24/2014 0811   CALCIUM 8.6 09/24/2014 0811   GFRNONAA >90 09/24/2014 0811   GFRAA >90 09/24/2014 0811      COAGS:  Recent Labs    Lab Results  Component Value Date   INR 1.05 09/23/2014   INR 1.03 02/28/2014      Radiology:  Imaging Results (Last 48 hours)    Ct Head Wo Contrast  09/23/2014 CLINICAL DATA: Headache, dizziness, weakness. Visual change in the left eye with left-sided headache. Code stroke. EXAM: CT HEAD WITHOUT CONTRAST TECHNIQUE: Contiguous axial images were obtained from the base of the skull through the vertex without intravenous contrast. COMPARISON:  02/28/2014 FINDINGS: Skull and Sinuses:Negative for fracture or destructive process. The mastoids, middle ears, and imaged paranasal sinuses are clear. Orbits: No acute abnormality. Brain: No evidence of acute infarction, hemorrhage, hydrocephalus, or mass lesion/mass effect. Remote lacunar infarct in the right thalamus and small remote ischemic focus in the left corona radiata. Low-attenuation along the lower right sylvian fissure is most consistent with a dilated perivascular space. Since this is a code stroke, page to Dr. Nicole Kindred placed on 09/23/2014 at 10:04 am. IMPRESSION: 1. No acute intracranial findings. 2. Chronic small vessel disease with remote right thalamus  lacunar infarct. Electronically Signed By: Monte Fantasia M.D. On: 09/23/2014 10:06   Ct Angio Neck W/cm &/or Wo/cm  09/24/2014 CLINICAL DATA: Initial evaluation for carotid stenosis. EXAM: CT ANGIOGRAPHY NECK TECHNIQUE: Multidetector CT imaging of the neck was performed using the standard protocol during bolus administration of intravenous contrast. Multiplanar CT image reconstructions and MIPs were obtained to evaluate the vascular anatomy. Carotid stenosis measurements (when applicable) are obtained utilizing NASCET criteria, using the distal internal carotid diameter as the denominator. CONTRAST: 21mL OMNIPAQUE IOHEXOL 350 MG/ML SOLN COMPARISON: Prior intracranial MRA from earlier on the same day. FINDINGS: Aortic arch: The visualized aortic arch is of normal caliber are with normal 3 vessel morphology. Scattered calcified plaque present within the partially visualized arch in at the origin of the great vessels. No high-grade stenosis seen at the origin of the great vessels. Visualized subclavian arteries widely patent. Right carotid system: Right common carotid artery bottle opacified to the level of the carotid bifurcation. There are is heavy calcified atheromatous plaque seen about the right carotid bifurcation  extending into the proximal right ICA. There is associated stenosis of approximately 70-80% by NASCET criteria. The area of involvement extends from the carotid bifurcation and measures approximately 6 mm in length. There is additional coarse calcification slightly more distally within the right ICA without significant stenosis. Distally, the right ICA is well opacified to the skullbase. Prominent arterial calcifications present within the cavernous right ICA. Left carotid system: Scattered calcified plaque present at the origin of the left common carotid artery without significant stenosis. Left common carotid artery well opacified to the carotid bifurcation. There is extensive coarse atheromatous calcified plaque at the left carotid bifurcation extending into the proximal left ICA. There is associated stenosis of up to 70-80% by NASCET criteria. The area of involvement extends from the carotid bifurcation in measures approximately 12 mm in length. Distally, the left ICA is well opacified to the skullbase. Scattered atheromatous plaque present within the cavernous left ICA. Vertebral arteries:Both vertebral artery arise from the subclavian arteries. Minimal atheromatous plaque present at the origin of the vertebral arteries bilaterally, slightly greater on the right. No significant stenosis appreciated. Vertebral arteries well a opacified to the level of the skullbase without evidence of occlusion or high-grade stenosis. The intracranial right vertebral artery is fairly diminutive with a small hypoplastic and stenotic branch extending to the vertebrobasilar junction beyond the takeoff of the right posterior inferior cerebral artery. This is similar as seen on prior MRA. Skeleton: Moderate to advanced multilevel degenerative disc disease present within the visualized spine as evidenced by intervertebral disc space narrowing, endplate sclerosis, and osteophytosis. Changes are most severe at the C4-5 through C7-T1  levels. No acute osseous abnormality. No worrisome lytic or blastic osseous lesion. Other neck: No acute soft tissue abnormality identified within the neck. No adenopathy. Thyroid gland within normal limits. Visualized superior mediastinum within normal limits. Visualized lung apices are clear. IMPRESSION: 1. Heavy atheromatous plaque about the carotid bifurcations extending into the proximal ICAs bilaterally. There is associated stenoses of approximately 70-80% by NASCET criteria bilaterally, slightly worse on the left. The area of involvement extends from the carotid bifurcations bilaterally, and extends approximately 6 mm in length on the right, and 12 mm in length on the left. 2. Widely patent vertebral arteries bilaterally. The distal intracranial aspect of the right vertebral artery is markedly diminutive and stenotic as compared to the left. This is similar as seen on prior intracranial MRA. Electronically Signed By: Jeannine Boga M.D. On:  09/24/2014 00:52   Mr Brain Wo Contrast  09/23/2014 CLINICAL DATA: 59 year old male with slurred speech and left facial numbness. Person history of previous lacunar infarcts. Initial encounter. EXAM: MRI HEAD WITHOUT CONTRAST MRA HEAD WITHOUT CONTRAST TECHNIQUE: Multiplanar, multiecho pulse sequences of the brain and surrounding structures were obtained without intravenous contrast. Angiographic images of the head were obtained using MRA technique without contrast. COMPARISON: Head CT without contrast 0957 hr today. Brain MRI and MRA 02/28/2014. FINDINGS: MRI HEAD FINDINGS Stable cerebral volume since 2015. Major intracranial vascular flow voids are stable. No restricted diffusion to suggest acute infarction. No midline shift, mass effect, evidence of mass lesion, ventriculomegaly, extra-axial collection or acute intracranial hemorrhage. Cervicomedullary junction and pituitary are within normal limits. Negative visualized cervical spine. Stable  gray and white matter signal throughout the brain. Visible internal auditory structures appear normal. Stable mastoids and paranasal sinuses. Stable orbits soft tissues. Visualized scalp soft tissues are within normal limits. Stable bone marrow signal. MRA HEAD FINDINGS Chronically decreased antegrade flow signal in the distal right vertebral artery which appears atherosclerotic and stenotic. The right PICA remains patent. Stable antegrade flow signal in the distal left vertebral artery. Left PICA is patent. Stable basilar artery without stenosis. SCA and PCA origins are stable and within normal limits. Bilateral PCA branches are stable with mild left P2 segment stenosis, preserved distal PCA flow. Stable posterior communicating arteries. Stable antegrade flow in both ICA siphons with moderate left greater than right siphon irregularity corresponding to calcified atherosclerotic plaque on the earlier CT. Up to mild siphon stenosis on the left. Ophthalmic artery origins are within normal limits. Carotid termini remain patent. Dominant right ACA A1 segment again noted. Anterior communicating artery and visualized bilateral ACA branches are stable and within normal limits. Visualized left MCA branches are stable and within normal limits. IMPRESSION: 1. No acute intracranial abnormality. Stable non contrast MRI appearance of the brain since 2015. 2. Stable intracranial MRA; chronically diseased and stenotic distal right vertebral artery, and left greater than right carotid siphon atherosclerosis. Electronically Signed By: Genevie Ann M.D. On: 09/23/2014 15:29   Mr Jodene Nam Head/brain Wo Cm  09/23/2014 CLINICAL DATA: 59 year old male with slurred speech and left facial numbness. Person history of previous lacunar infarcts. Initial encounter. EXAM: MRI HEAD WITHOUT CONTRAST MRA HEAD WITHOUT CONTRAST TECHNIQUE: Multiplanar, multiecho pulse sequences of the brain and surrounding structures were obtained without  intravenous contrast. Angiographic images of the head were obtained using MRA technique without contrast. COMPARISON: Head CT without contrast 0957 hr today. Brain MRI and MRA 02/28/2014. FINDINGS: MRI HEAD FINDINGS Stable cerebral volume since 2015. Major intracranial vascular flow voids are stable. No restricted diffusion to suggest acute infarction. No midline shift, mass effect, evidence of mass lesion, ventriculomegaly, extra-axial collection or acute intracranial hemorrhage. Cervicomedullary junction and pituitary are within normal limits. Negative visualized cervical spine. Stable gray and white matter signal throughout the brain. Visible internal auditory structures appear normal. Stable mastoids and paranasal sinuses. Stable orbits soft tissues. Visualized scalp soft tissues are within normal limits. Stable bone marrow signal. MRA HEAD FINDINGS Chronically decreased antegrade flow signal in the distal right vertebral artery which appears atherosclerotic and stenotic. The right PICA remains patent. Stable antegrade flow signal in the distal left vertebral artery. Left PICA is patent. Stable basilar artery without stenosis. SCA and PCA origins are stable and within normal limits. Bilateral PCA branches are stable with mild left P2 segment stenosis, preserved distal PCA flow. Stable posterior communicating arteries. Stable antegrade flow in  both ICA siphons with moderate left greater than right siphon irregularity corresponding to calcified atherosclerotic plaque on the earlier CT. Up to mild siphon stenosis on the left. Ophthalmic artery origins are within normal limits. Carotid termini remain patent. Dominant right ACA A1 segment again noted. Anterior communicating artery and visualized bilateral ACA branches are stable and within normal limits. Visualized left MCA branches are stable and within normal limits. IMPRESSION: 1. No acute intracranial abnormality. Stable non contrast MRI appearance of  the brain since 2015. 2. Stable intracranial MRA; chronically diseased and stenotic distal right vertebral artery, and left greater than right carotid siphon atherosclerosis. Electronically Signed By: Genevie Ann M.D. On: 09/23/2014 15:29    Non-Invasive Vascular Imaging:  Carotid Duplex (Doppler) has been completed. Preliminary findings: Right = 60-79% ICA stenosis. Left = 80-99% ICA stenosis. Stenosis has worsened bilaterally since last study 02/2014 where previously the right was 1-39% range and the left was 60-79% range.  Statin: Yes.  Beta Blocker: No. Aspirin: Yes.  ACEI: Yes.  ARB: No. Other antiplatelets/anticoagulants: Yes.  Pletal   ASSESSMENT/PLAN: This is a 59 y.o. male with history of bilateral carotid stenosis presenting with non dominant right hemisphere TIA. He had an acute onset of left visual impairment, left facial droop, slurred speech and left sided weakness. His deficits have resolved. He had a prior left stroke back in July 2015 that was felt by neurology to be due to secondary to intracranial pathology rather than carotid disease.   His current carotid doppler reveals right ICA of 60-79% and left ICA stenosis of 80-99%, both of which have progressed since July 2015. He will need bilateral carotid intervention with endarterectomy versus stenting. The sequencing of his intervention will be deferred to Dr. Trula Slade who has been following his carotid disease. We will arrange outpatient follow-up.         I have reviewed his CT scans which showed greater than 70% bilateral carotid stenosis. His symptoms correlate with left sided carotid disease. I discussed proceeding electively with left carotid endarterectomy. I would like for him to be off of his Trental. He will also need formal clearance from GI, as he will require systemic heparinization for carotid endarterectomy. He needs to continue on aspirin. I am okay with discharge and scheduling carotid  endarterectomy, likely next week assuming there had been no further issues with GI bleeding.  I discussed the details of carotid endarterectomy, including the risks and benefits which include but are not limited to the risk of cardiac point complications, bleeding, the risk of stroke as well as nerve injury. The patient understands all this and wants to proceed.  [place] 09/25/2014

## 2014-11-13 NOTE — Transfer of Care (Signed)
Immediate Anesthesia Transfer of Care Note  Patient: Brad [name]  Procedure(s) Performed: Procedure(s): ENDARTERECTOMY CAROTID WITH PATCH ANGIOPLASTY (Left)  Patient Location: PACU  Anesthesia Type:General  Level of Consciousness: awake, alert  and oriented  Airway & Oxygen Therapy: Patient Spontanous Breathing and Patient connected to nasal cannula oxygen  Post-op Assessment: Report given to RN and Post -op Vital signs reviewed and stable  Post vital signs: Reviewed and stable  Last Vitals:  Filed Vitals:   11/13/14 0619  BP: 149/69  Pulse: 74  Temp: 36.8 C  Resp: 18    Complications: No apparent anesthesia complications   BP 761/60. SPO2 97 on 2L N/C. Pt alert and oriented. Follows commands. Denies pain.

## 2014-11-13 NOTE — Anesthesia Preprocedure Evaluation (Addendum)
Anesthesia Evaluation  Patient identified by MRN, date of birth, ID band Patient awake    Reviewed: Allergy & Precautions, NPO status , Patient's Chart, lab work & pertinent test results  History of Anesthesia Complications Negative for: history of anesthetic complications  Airway Mallampati: I  TM Distance: >3 FB Neck ROM: Full    Dental  (+) Edentulous Upper, Dental Advisory Given   Pulmonary shortness of breath, COPD COPD inhaler, Current Smoker,    + wheezing      Cardiovascular hypertension, Pt. on medications - angina+ Peripheral Vascular Disease Rhythm:Regular Rate:Normal  2/16 ECHO: EF 76-81%, grade 2 diastolic dysfunction, valves OK   Neuro/Psych CVA, No Residual Symptoms    GI/Hepatic Neg liver ROS, GERD-  Medicated and Controlled,  Endo/Other  diabetes, Oral Hypoglycemic AgentsHypothyroidism Morbid obesity  Renal/GU negative Renal ROS     Musculoskeletal   Abdominal (+) + obese,   Peds  Hematology  (+) Blood dyscrasia (Hb 9.8), ,   Anesthesia Other Findings   Reproductive/Obstetrics                            Anesthesia Physical Anesthesia Plan  ASA: III  Anesthesia Plan: General   Post-op Pain Management:    Induction: Intravenous  Airway Management Planned: Oral ETT  Additional Equipment: Arterial line  Intra-op Plan:   Post-operative Plan: Extubation in OR  Informed Consent: I have reviewed the patients History and Physical, chart, labs and discussed the procedure including the risks, benefits and alternatives for the proposed anesthesia with the patient or authorized representative who has indicated his/her understanding and acceptance.   Dental advisory given  Plan Discussed with: CRNA and Surgeon  Anesthesia Plan Comments: (Plan routine monitors, A line, GETA)        Anesthesia Quick Evaluation

## 2014-11-13 NOTE — Progress Notes (Addendum)
Dr. Trula Slade at the bedside. Per MD- all symtpoms expected.

## 2014-11-13 NOTE — Op Note (Signed)
Patient name: Brad [name] MRN: [id number] DOB: 11/05/1955 Sex: male  11/13/2014 Pre-operative Diagnosis: Symptomatic   left carotid stenosis Post-operative diagnosis:  Same Surgeon:  Eldridge Abrahams Assistants:  Jory Sims Procedure:    left carotid Endarterectomy with bovine pericardial patch angioplasty Anesthesia:  General Blood Loss:  See anesthesia record Specimens:  Carotid Plaque to pathology  Findings:  90 %stenosis; Thrombus:  none  Indications:  Patient admitted with bilateral carotid stenosis and left brain symptoms with old right brain infarcts.  He is here today for CEA.  No further sx's since I last saw him  Procedure:  The patient was identified in the holding area and taken to [place] [facility]  The patient was then placed supine on the table.   General endotrachial anesthesia was administered.  The patient was prepped and draped in the usual sterile fashion.  A time out was called and antibiotics were administered.  The incision was made along the anterior border of the left sternocleidomastoid muscle.  Cautery was used to dissect through the subcutaneous tissue.  The platysma muscle was divided with cautery.  The internal jugular vein was exposed along its anterior medial border.  The common facial vein was exposed and then divided between 2-0 silk ties and metal clips.  The common carotid artery was then circumferentially exposed and encircled with an umbilical tape.  The vagus nerve was identified and protected.  Next sharp dissection was used to expose the external carotid artery and the superior thyroid artery.  The were encircled with a blue vessel loop and a 2-0 silk tie respectively.  Finally, the internal carotid was carefully dissected free.  An umbilical tape was placed around the internal carotid artery distal to the diseased segment.  The hypoglossal nerve was visualized throughout and protected.  The patient was given systemic heparinization.  A bovine  carotid patch was selected and prepared on the back table.  A 10 french shunt was also prepared.  After blood pressure readings were appropriate and the heparin had been given time to circulate, the internal carotid artery was occluded with a baby Gregory clamp.  The external and common carotid arteries were then occluded with vascular clamps and the 2-0 tie tightened on the superior thyroid artery.  A #11 blade was used to make an arteriotomy in the common carotid artery.  This was extended with Potts scissors along the anterior and lateral border of the common and internal carotid artery.  Approximately 90% stenosis was identified.  There was no thrombus identified.  The 10 french shunt was then placed.  A kleiner kuntz elevator was used to perform endarterectomy.  An eversion endarterectomy was performed in the external carotid artery.  A good distal endpoint was obtained in the internal carotid artery.  The specimen was removed and sent to pathology.  Heparinized saline was used to irrigate the endarterectomized field.  All potential embolic debris was removed.  Bovine pericardial patch angioplasty was then performed using a running 6-0 Prolene. Just prior to completion of the repair, the shunt was removed. The common internal and external carotid arteries were all appropriately flushed. The artery was again irrigated with heparin saline.  The anastomosis was then secured. The clamp was first released on the external carotid artery followed by the common carotid artery approximately 30 seconds later, bloodflow was reestablish through the internal carotid artery.  Next, a hand-held  Doppler was used to evaluate the signals in the common, external, and  internal  carotid arteries, all of which had appropriate signals. I then administered  50 mg protamine. The wound was then irrigated.  After hemostasis was achieved, the carotid sheath was reapproximated with 3-0 Vicryl. The  platysma muscle was reapproximated  with running 3-0 Vicryl. The skin  was closed with 4-0 Vicryl. Dermabond was placed on the skin. The  patient was then successfully extubated. His neurologic exam was  similar to his preprocedural exam. The patient was then taken to recovery room  in stable condition. There were no complications.     Disposition:  To PACU in stable condition.  Relevant Operative Details:  Normal anatomy.  Bifurcation plaque  V. [name], M.D. [facility] Office: [phone] Pager:  [phone]

## 2014-11-13 NOTE — Consult Note (Addendum)
Referring Physician: Code Stroke    Chief Complaint: left facial droop after CEA  HPI:                                                                                                                                         Brad [name] is an 59 y.o. male with known left carotid stenosis who underwent a left CAE today.  Post surgery he was noted to be stable in PACU.  On arrival to the Step down nurse noted he had a left facial droop and  Swelling around the surgical sight. Code stroke was called.  Code stroke was canceled due to recent surgery.   Date last known well: Date: 11/13/2014 Time last known well: Time: 12:15 tPA Given: No: surgery Modified Rankin: Rankin Score=0    Past Medical History  Diagnosis Date  . Depression   . GERD (gastroesophageal reflux disease)   . Hyperlipidemia 08/27/2014    takes Simvastatin daily  . Hypertension     takes Amlodipine and Lisinopril daily  . Hypothyroidism 08/27/2014    takes Synthroid daily  . Anemia     takes Ferrous Sulfated daily  . Carotid stenosis 08/27/2014    takes Aspirin daily  . Diabetes mellitus type II     takes Metformin and Glipizide daily  . Muscle spasm     takes Zanaflex daily as needed  . Insomnia     takes Pamelor nightly  . COPD (chronic obstructive pulmonary disease)     Albuterol daily as needed and Spiriva daily  . MVP (mitral valve prolapse)   . History of blood clots     in right leg;being followed by Dr.Brabham for this  . Shortness of breath dyspnea   . Peripheral neuropathy   . Headache     occasionally  . Dizziness   . Stroke   . Arthritis   . Joint pain     hips  . Peripheral edema     takes Furosemide daily as needed  . History of hiatal hernia   . History of gastric ulcer   . Urinary frequency   . Urinary urgency   . History of blood transfusion 09/2014    2 units-no abnormal reaction noted    Past Surgical History  Procedure Laterality Date  . Neg stress test  2000  . Rotator cuff  repair Right 2009    multiple f/u Sxs/infection Tx  . Incision and drainage perirectal abscess  03/2009  . Esophagogastroduodenoscopy N/A 09/24/2014    Procedure: ESOPHAGOGASTRODUODENOSCOPY (EGD);  Surgeon: Winfield Cunas., MD;  Location: [facility] ENDOSCOPY;  Service: Endoscopy;  Laterality: N/A;  . I&d of right arm      Family History  Problem Relation Age of Onset  . Alcohol abuse Father   . Lung cancer Father   . Heart disease Mother     automated implantable cardioverter-defibrillator   .  Diabetes type II Mother   . Lung cancer Maternal Grandmother   . Lung cancer Sister    Social History:  reports that he has been smoking Cigarettes.  He has a 96 pack-year smoking history. He has never used smokeless tobacco. He reports that he drinks alcohol. He reports that he does not use illicit drugs.  Allergies:  Allergies  Allergen Reactions  . Doxycycline Hives and Itching  . Lipitor [Atorvastatin] Other (See Comments)    Myalgia   . Oxycodone Itching    Medications:                                                                                                                           Prior to Admission:  Prescriptions prior to admission  Medication Sig Dispense Refill Last Dose  . amLODipine (NORVASC) 5 MG tablet Take 1 tablet (5 mg total) by mouth daily. 90 tablet 3 11/13/2014 at Unknown time  . aspirin EC 325 MG tablet Take 1 tablet (325 mg total) by mouth daily. 30 tablet 0 11/12/2014 at Unknown time  . Blood Pressure Monitor DEVI Use to check Blood pressure daily 1 Device 0 Taking  . ferrous sulfate 325 (65 FE) MG tablet Take 1 tablet (325 mg total) by mouth 3 (three) times daily with meals. 30 tablet 3 11/13/2014 at Unknown time  . furosemide (LASIX) 40 MG tablet 1 tab by mouth in the AM, with a second dose in PM as needed only for swelling (Patient taking differently: Take 40 mg by mouth daily. Can take second dose in evening as needed for swelling) 60 tablet 11 11/13/2014 at Unknown  time  . glipiZIDE (GLIPIZIDE XL) 2.5 MG 24 hr tablet Take 1 tablet (2.5 mg total) by mouth daily with breakfast. As needed for blood sugar > 200 90 tablet 3 11/12/2014 at Unknown time  . glucosamine-chondroitin 500-400 MG tablet Take 2 tablets by mouth daily.   Past Week at Unknown time  . glucose blood test strip 1 each by Other route See admin instructions. Check blood sugar once daily.   Taking  . Lancets MISC 1 each by Other route See admin instructions. Check blood sugar once daily.   Taking  . levothyroxine (SYNTHROID, LEVOTHROID) 88 MCG tablet Take 1 tablet (88 mcg total) by mouth daily. 90 tablet 3 11/13/2014 at Unknown time  . lisinopril (PRINIVIL,ZESTRIL) 20 MG tablet Take 1 tablet (20 mg total) by mouth daily. 90 tablet 3 11/12/2014 at Unknown time  . metFORMIN (GLUCOPHAGE) 500 MG tablet Take 2 tablets (1,000 mg total) by mouth daily. 60 tablet 0 11/12/2014 at Unknown time  . ondansetron (ZOFRAN-ODT) 8 MG disintegrating tablet Take 1 tablet (8 mg total) by mouth every 8 (eight) hours as needed for nausea or vomiting. 20 tablet 0 Past Month at Unknown time  . pantoprazole (PROTONIX) 40 MG tablet Take 1 tablet (40 mg total) by mouth 2 (two) times daily. 60 tablet 0 Past Week at Unknown time  .  PROAIR HFA 108 (90 BASE) MCG/ACT inhaler INHALE 2 PUFFS BY MOUTH EVERY 6 HOURS AS NEEDED FOR WHEEZING OR SHORTNESS OF BREATH 8.5 g 11 11/13/2014 at Unknown time  . simvastatin (ZOCOR) 40 MG tablet Take 1 tablet (40 mg total) by mouth at bedtime. 90 tablet 3 11/12/2014 at Unknown time  . SPIRIVA HANDIHALER 18 MCG inhalation capsule INHALE 1 CAPSULE VIA HANDIHALER EVERY DAY 30 capsule 11 11/12/2014 at Unknown time  . tiotropium (SPIRIVA) 18 MCG inhalation capsule Place 18 mcg into inhaler and inhale 2 (two) times daily as needed (for wheezing).   Taking  . tiZANidine (ZANAFLEX) 4 MG tablet Take 8 mg by mouth every 6 (six) hours as needed for muscle spasms.   11/12/2014 at Unknown time  . traMADol (ULTRAM) 50 MG tablet  1-2 tabs by mouth up to four times per day (Patient taking differently: Take 50-100 mg by mouth every 6 (six) hours as needed for moderate pain. ) 120 tablet 2 11/12/2014 at Unknown time  . nicotine (NICODERM CQ - DOSED IN MG/24 HOURS) 21 mg/24hr patch Place 1 patch (21 mg total) onto the skin daily. (Patient not taking: Reported on 10/19/2014) 28 patch 0 Not Taking at Unknown time  . nortriptyline (PAMELOR) 10 MG capsule Take 1 capsule (10 mg total) by mouth at bedtime. 90 capsule 5 More than a month at Unknown time   Scheduled: . albuterol  3 mL Inhalation Q4H  . [START ON 11/14/2014] amLODipine  5 mg Oral Daily  . aspirin EC  325 mg Oral Daily  . cefUROXime (ZINACEF)  IV  1.5 g Intravenous Q12H  . [START ON 11/14/2014] docusate sodium  100 mg Oral Daily  . ferrous sulfate  325 mg Oral TID WC  . [START ON 11/14/2014] furosemide  40 mg Oral Daily  . HYDROmorphone      . HYDROmorphone      . insulin aspart  0-15 Units Subcutaneous TID WC  . [START ON 11/14/2014] levothyroxine  88 mcg Oral QAC breakfast  . lisinopril  20 mg Oral Daily  . nortriptyline  10 mg Oral QHS  . pantoprazole  40 mg Oral BID  . simvastatin  40 mg Oral QHS  . tiotropium  18 mcg Inhalation Daily   Continuous:  TKW:IOXBDZ chloride, acetaminophen **OR** acetaminophen, alum & mag hydroxide-simeth, guaiFENesin-dextromethorphan, hydrALAZINE, labetalol, magnesium sulfate 1 - 4 g bolus IVPB, metoprolol, ondansetron, phenol, potassium chloride, tiZANidine  ROS:                                                                                                                                       History obtained from the patient  General ROS: negative for - chills, fatigue, fever, night sweats, weight gain or weight loss Psychological ROS: negative for - behavioral disorder, hallucinations, memory difficulties, mood swings or suicidal ideation Ophthalmic ROS: negative for - blurry vision, double vision, eye pain or  loss of vision ENT  ROS: negative for - epistaxis, nasal discharge, oral lesions, sore throat, tinnitus or vertigo Allergy and Immunology ROS: negative for - hives or itchy/watery eyes Hematological and Lymphatic ROS: negative for - bleeding problems, bruising or swollen lymph nodes Endocrine ROS: negative for - galactorrhea, hair pattern changes, polydipsia/polyuria or temperature intolerance Respiratory ROS: negative for - cough, hemoptysis, shortness of breath or wheezing Cardiovascular ROS: negative for - chest pain, dyspnea on exertion, edema or irregular heartbeat Gastrointestinal ROS: negative for - abdominal pain, diarrhea, hematemesis, nausea/vomiting or stool incontinence Genito-Urinary ROS: negative for - dysuria, hematuria, incontinence or urinary frequency/urgency Musculoskeletal ROS: negative for - joint swelling or muscular weakness Neurological ROS: as noted in HPI Dermatological ROS: negative for rash and skin lesion changes  Neurologic Examination:                                                                                                      Blood pressure 124/55, pulse 63, temperature 98.1 F (36.7 C), temperature source Oral, resp. rate 18, height 5\' 11"  (1.803 m), weight 125.4 kg (276 lb 7.3 oz), SpO2 96 %.  HEENT-  Normocephalic, no lesions, without obvious abnormality.  Normal external eye and conjunctiva.  Normal TM's bilaterally.  Normal auditory canals and external ears. Normal external nose, mucus membranes and septum.  Normal pharynx. Cardiovascular- S1, S2 normal, pulses palpable throughout   Lungs- chest clear, no wheezing, rales, normal symmetric air entry Abdomen- normal findings: bowel sounds normal Extremities- no edema Lymph-no adenopathy palpable Musculoskeletal-no joint tenderness, deformity or swelling Skin-warm and dry, no hyperpigmentation, vitiligo, or suspicious lesions  Neurological Examination Mental Status: Alert, oriented, thought content appropriate.   Speech fluent without evidence of aphasia.  Able to follow 3 step commands without difficulty. Cranial Nerves: II: Discs flat bilaterally; Visual fields grossly normal, pupils equal, round, reactive to light and accommodation III,IV, VI: ptosis not present, extra-ocular motions intact bilaterally V,VII: weakness of the face only below the mouth on the left, facial light touch sensation normal bilaterally--noted swelling around surgical sight VIII: hearing normal bilaterally IX,X: uvula rises symmetrically XI: bilateral shoulder shrug XII: midline tongue extension Motor: Right : Upper extremity   5/5    Left:     Upper extremity   5/5  Lower extremity   5/5     Lower extremity   5/5 --right shoulder only raises to about 45 degrees due to recent surgery Tone and bulk:normal tone throughout; no atrophy noted Sensory: Pinprick and light touch intact throughout, bilaterally Deep Tendon Reflexes: 2+ and symmetric throughout Plantars: Right: downgoing   Left: downgoing Cerebellar: normal finger-to-nose, and normal heel-to-shin test Gait: not assessed       Lab Results: Basic Metabolic Panel:  Recent Labs Lab 11/13/14 0600 11/13/14 0722  NA 134* 136  K 4.5 4.5  CL 102  --   CO2 27  --   GLUCOSE 206*  --   BUN 17  --   CREATININE 0.94  --   CALCIUM 8.6  --     Liver Function Tests:  Recent  Labs Lab 11/13/14 0600  AST 34  ALT 29  ALKPHOS 134*  BILITOT 0.4  PROT 6.2  ALBUMIN 2.7*   No results for input(s): LIPASE, AMYLASE in the last 168 hours. No results for input(s): AMMONIA in the last 168 hours.  CBC:  Recent Labs Lab 11/13/14 0722  HGB 9.9*  HCT 29.0*    Cardiac Enzymes: No results for input(s): CKTOTAL, CKMB, CKMBINDEX, TROPONINI in the last 168 hours.  Lipid Panel: No results for input(s): CHOL, TRIG, HDL, CHOLHDL, VLDL, LDLCALC in the last 168 hours.  CBG:  Recent Labs Lab 11/13/14 0612 11/13/14 1002  GLUCAP 204* 173*     Microbiology: Results for orders placed or performed during the hospital encounter of 10/21/14  Surgical pcr screen     Status: Abnormal   Collection Time: 10/21/14  1:33 PM  Result Value Ref Range Status   MRSA, PCR POSITIVE (A) NEGATIVE Final   Staphylococcus aureus POSITIVE (A) NEGATIVE Final    Comment:        The Xpert SA Assay (FDA approved for NASAL specimens in patients over 58 years of age), is one component of a comprehensive surveillance program.  Test performance has been validated by [facility] for patients greater than or equal to 38 year old. It is not intended to diagnose infection nor to guide or monitor treatment.     Coagulation Studies: No results for input(s): LABPROT, INR in the last 72 hours.  Imaging: No results found.     Assessment and plan discussed with with attending physician and they are in agreement.    Etta Quill PA-C [facility] [phone]  11/13/2014, 12:53 PM   Assessment: 59 y.o. 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 No other associated symptoms. Likely related to surgery and is a known complication. No reason to suspect stroke at this time.   Stroke Risk Factors - diabetes mellitus, hyperlipidemia and hypertension  1) Please call with any further questions or concerns.   Roland Rack, MD [facility] [phone]  If 7pm- 7am, please page neurology on call as listed in [place].

## 2014-11-13 NOTE — Anesthesia Procedure Notes (Signed)
Procedure Name: Intubation Date/Time: 11/13/2014 8:00 AM Performed by: Merdis Delay Pre-anesthesia Checklist: Patient identified, Timeout performed, Emergency Drugs available, Suction available and Patient being monitored Patient Re-evaluated:Patient Re-evaluated prior to inductionOxygen Delivery Method: Circle system utilized Preoxygenation: Pre-oxygenation with 100% oxygen Intubation Type: IV induction Ventilation: Mask ventilation without difficulty and Oral airway inserted - appropriate to patient size Laryngoscope Size: Mac and 4 Grade View: Grade I Tube type: Oral Tube size: 7.5 mm Number of attempts: 1 Airway Equipment and Method: Stylet and LTA kit utilized Placement Confirmation: ETT inserted through vocal cords under direct vision,  positive ETCO2,  CO2 detector and breath sounds checked- equal and bilateral Secured at: 23 cm Tube secured with: Tape Dental Injury: Teeth and Oropharynx as per pre-operative assessment

## 2014-11-13 NOTE — Anesthesia Postprocedure Evaluation (Signed)
  Anesthesia Post-op Note  Patient: Brad [name]  Procedure(s) Performed: Procedure(s): ENDARTERECTOMY CAROTID WITH PATCH ANGIOPLASTY (Left)  Patient Location: PACU  Anesthesia Type:General  Level of Consciousness: awake, alert , oriented and patient cooperative  Airway and Oxygen Therapy: Patient Spontanous Breathing and Patient connected to nasal cannula oxygen  Post-op Pain: none  Post-op Assessment: Post-op Vital signs reviewed, Patient's Cardiovascular Status Stable, Respiratory Function Stable, Patent Airway, No signs of Nausea or vomiting and Pain level controlled  Post-op Vital Signs: Reviewed and stable  Last Vitals:  Filed Vitals:   11/13/14 1145  BP: 122/59  Pulse: 65  Temp: 36.7 C  Resp: 16    Complications: No apparent anesthesia complications

## 2014-11-13 NOTE — Code Documentation (Signed)
59yo male s/p left carotid endarterectomy today.  Patient admitted to 3S04 from PACU.  On bedside assessment 3S RN noticed left facial droop and swelling at surgical site.  PACU RN denies this on previous assessments.  Code Stroke activated.  Stroke team to the bedside.  NIHSS 1, see documentation for details and code stroke times.  Patient with left facial droop.  Dr. Leonel Ramsay at the bedside.  Contraindicated for tPA d/t recent surgery.  Not an endovascular treatment candidate at this time.  Code stroke canceled per Dr. Leonel Ramsay.  Bedside handoff with 3S RN Di Kindle.

## 2014-11-13 NOTE — Progress Notes (Signed)
Pt adm to SDU post CAE and called by bedside RN upon Pt's arrival. Bedside RN Di Kindle reported a new L facial droop and concern about swelling and eccymosis along surgical incision. No other neuro symptoms noted at this time. Discussed the need to notify Dr. Trula Slade and Call a Code Stroke.

## 2014-11-14 LAB — BASIC METABOLIC PANEL
Anion gap: 9 (ref 5–15)
BUN: 15 mg/dL (ref 6–23)
CO2: 25 mmol/L (ref 19–32)
Calcium: 8.3 mg/dL — ABNORMAL LOW (ref 8.4–10.5)
Chloride: 101 mmol/L (ref 96–112)
Creatinine, Ser: 0.95 mg/dL (ref 0.50–1.35)
GFR calc Af Amer: >90 mL/min (ref >90)
GFR calc non Af Amer: >90 mL/min (ref >90)
Glucose, Bld: 147 mg/dL — ABNORMAL HIGH (ref 70–99)
Potassium: 4 mmol/L (ref 3.5–5.1)
Sodium: 135 mmol/L (ref 135–145)

## 2014-11-14 LAB — CBC
HCT: 30 % — ABNORMAL LOW (ref 39.0–52.0)
Hemoglobin: 8.9 g/dL — ABNORMAL LOW (ref 13.0–17.0)
MCH: 23.8 pg — ABNORMAL LOW (ref 26.0–34.0)
MCHC: 29.7 g/dL — ABNORMAL LOW (ref 30.0–36.0)
MCV: 80.2 fL (ref 78.0–100.0)
Platelets: 222 10*3/uL (ref 150–400)
RBC: 3.74 MIL/uL — ABNORMAL LOW (ref 4.22–5.81)
RDW: 24.1 % — ABNORMAL HIGH (ref 11.5–15.5)
WBC: 4.9 10*3/uL (ref 4.0–10.5)

## 2014-11-14 NOTE — Discharge Summary (Signed)
[facility] Discharge Summary  Brad [name] 1956-01-08 59 y.o. male  [id number]  Admission Date: 11/13/2014  Discharge Date: 11/14/2014  Physician: [name], MD  Admission Diagnosis: Left Internal Carotid Artery Stenosis  I65.22  HPI:   This is a 59 y.o. male who [facility] was consulted on regarding carotid artery stenosis on 11/13/14. One day prior, the patient noticed acute onset of slurred speech and left sided weakness and left visual changes. His wife also noticed that he was dragging his left leg when he walked. He does have a hx of remote lacunar infarcts involving the right thalamus and left putamen based on MRI of brain performed on 02/28/2014. Doppler ultrasound performed on 03/01/2014 showed a left 60-79% ICA stenosis.   During his hospitalization in July 2015, he presented to the hospital with c/o dizziness, clumsiness, unsteady gait, facial droop, slurred speech, and right sided weakness. A statin was started at that time. Aspirin was changed to 81mg  daily since he was on Pletal for PAD followed by Dr. Trula Slade, which he was last seen November 2015. At that time, Dr. Trula Slade had reviewed his most recent dopplers and it was felt that the patient had a left brain stroke. He was seen and evaluated by neurology, and they felt that his stroke was secondary to intracranial pathology rather than extracranial left carotid stenosis. Left carotid endarterectomy versus stenting was not recommended at that time. He has been following the patient with surveillance q6 months.  Additionally, pt has a hx of tobacco abuse, type 2 diabetes, HTN, and carotid artery stenosis.   Hospital Course:  The patient was admitted to the hospital and taken to the operating room on 11/13/2014 and underwent left carotid endarterectomy.  The patient tolerated the procedure well and was transported to the PACU in stable condition.  The patient had a slight left facial droop seen during  post-op rounds on day of surgery. Later that evening,  the patient was reported per nursing to have a "left facial droop and level 1 hematoma at carotid site" and code stroke was called. There were no notes seen in the chart regarding evaluation from neurology or stroke team. By POD 1, the patient's neuro status was intact except for slight left facial droop. His left neck incision was clean and intact with mild swelling and no palpable hematoma. He was voiding and ambulating without difficulty. He was ready to go home and declared that he was ready to sign out if he wasn't discharged home. He proceeded to get dressed and pull his IVs out prior to being seen by Dr. Scot Dock. He was discharged home on POD 1 in good condition. He was advised to contact our office if his neck continued to swell or if he developed any new TIA or stroke symptoms.      Recent Labs  11/13/14 0600 11/13/14 0722 11/14/14 0514  NA 134* 136 135  K 4.5 4.5 4.0  CL 102  --  101  CO2 27  --  25  GLUCOSE 206*  --  147*  BUN 17  --  15  CALCIUM 8.6  --  8.3*    Recent Labs  11/13/14 0722 11/14/14 0514  WBC  --  4.9  HGB 9.9* 8.9*  HCT 29.0* 30.0*  PLT  --  222    Recent Labs  11/13/14 1515  INR 1.09    Discharge Instructions:   The patient is discharged to home with extensive instructions on wound care and  progressive ambulation.  They are instructed not to drive or perform any heavy lifting until returning to see the physician in his office.  Discharge Instructions    Call MD for:  redness, tenderness, or signs of infection (pain, swelling, bleeding, redness, odor or green/yellow discharge around incision site)    Complete by:  As directed      Call MD for:  severe or increased pain, loss or decreased feeling  in affected limb(s)    Complete by:  As directed      Call MD for:  temperature >100.5    Complete by:  As directed      Discharge instructions    Complete by:  As directed   You may shower in 24  hours     Driving Restrictions    Complete by:  As directed   No driving for 2 weeks     Increase activity slowly    Complete by:  As directed   Walk with assistance use walker or cane as needed     Lifting restrictions    Complete by:  As directed   No lifting for 6 weeks     Resume previous diet    Complete by:  As directed            Discharge Diagnosis:  Left Internal Carotid Artery Stenosis  I65.22  Secondary Diagnosis: Patient Active Problem List   Diagnosis Date Noted  . Carotid stenosis 11/13/2014  . Atherosclerotic peripheral vascular disease with intermittent claudication   . Duodenal ulcer disease   . Anemia   . Tobacco abuse 09/23/2014  . Hyperlipidemia 08/27/2014  . Hypothyroidism 08/27/2014  . Bilateral carotid artery stenosis   . Type 2 diabetes mellitus with other circulatory complications 16/05/9603  . Hypertensive heart disease   . History of CVA (cerebrovascular accident) without residual deficits   . Obesity (BMI 30-39.9) 01/19/2014  . Erectile dysfunction 10/10/2013  . Chronic LBP   . COPD (chronic obstructive pulmonary disease) 03/17/2010  . GERD 03/17/2010   Past Medical History  Diagnosis Date  . Depression   . GERD (gastroesophageal reflux disease)   . Hyperlipidemia 08/27/2014    takes Simvastatin daily  . Hypertension     takes Amlodipine and Lisinopril daily  . Hypothyroidism 08/27/2014    takes Synthroid daily  . Anemia     takes Ferrous Sulfated daily  . Carotid stenosis 08/27/2014    takes Aspirin daily  . Diabetes mellitus type II     takes Metformin and Glipizide daily  . Muscle spasm     takes Zanaflex daily as needed  . Insomnia     takes Pamelor nightly  . COPD (chronic obstructive pulmonary disease)     Albuterol daily as needed and Spiriva daily  . MVP (mitral valve prolapse)   . History of blood clots     in right leg;being followed by Dr.Brabham for this  . Shortness of breath dyspnea   . Peripheral neuropathy   .  Headache     occasionally  . Dizziness   . Stroke   . Arthritis   . Joint pain     hips  . Peripheral edema     takes Furosemide daily as needed  . History of hiatal hernia   . History of gastric ulcer   . Urinary frequency   . Urinary urgency   . History of blood transfusion 09/2014    2 units-no abnormal reaction noted  Medication List    STOP taking these medications        nicotine 21 mg/24hr patch  Commonly known as:  NICODERM CQ - dosed in mg/24 hours      TAKE these medications        amLODipine 5 MG tablet  Commonly known as:  NORVASC  Take 1 tablet (5 mg total) by mouth daily.     aspirin EC 325 MG tablet  Take 1 tablet (325 mg total) by mouth daily.     Blood Pressure Monitor Devi  Use to check Blood pressure daily     ferrous sulfate 325 (65 FE) MG tablet  Take 1 tablet (325 mg total) by mouth 3 (three) times daily with meals.     furosemide 40 MG tablet  Commonly known as:  LASIX  1 tab by mouth in the AM, with a second dose in PM as needed only for swelling     glipiZIDE 2.5 MG 24 hr tablet  Commonly known as:  GLIPIZIDE XL  Take 1 tablet (2.5 mg total) by mouth daily with breakfast. As needed for blood sugar > 200     glucosamine-chondroitin 500-400 MG tablet  Take 2 tablets by mouth daily.     glucose blood test strip  1 each by Other route See admin instructions. Check blood sugar once daily.     Lancets Misc  1 each by Other route See admin instructions. Check blood sugar once daily.     levothyroxine 88 MCG tablet  Commonly known as:  SYNTHROID, LEVOTHROID  Take 1 tablet (88 mcg total) by mouth daily.     lisinopril 20 MG tablet  Commonly known as:  PRINIVIL,ZESTRIL  Take 1 tablet (20 mg total) by mouth daily.     metFORMIN 500 MG tablet  Commonly known as:  GLUCOPHAGE  Take 2 tablets (1,000 mg total) by mouth daily.     nortriptyline 10 MG capsule  Commonly known as:  PAMELOR  Take 1 capsule (10 mg total) by mouth at  bedtime.     ondansetron 8 MG disintegrating tablet  Commonly known as:  ZOFRAN-ODT  Take 1 tablet (8 mg total) by mouth every 8 (eight) hours as needed for nausea or vomiting.     oxyCODONE-acetaminophen 5-325 MG per tablet  Commonly known as:  PERCOCET/ROXICET  Take 1 tablet by mouth every 6 (six) hours as needed for moderate pain.     pantoprazole 40 MG tablet  Commonly known as:  PROTONIX  Take 1 tablet (40 mg total) by mouth 2 (two) times daily.     PROAIR HFA 108 (90 BASE) MCG/ACT inhaler  Generic drug:  albuterol  INHALE 2 PUFFS BY MOUTH EVERY 6 HOURS AS NEEDED FOR WHEEZING OR SHORTNESS OF BREATH     simvastatin 40 MG tablet  Commonly known as:  ZOCOR  Take 1 tablet (40 mg total) by mouth at bedtime.     tiotropium 18 MCG inhalation capsule  Commonly known as:  SPIRIVA  Place 18 mcg into inhaler and inhale 2 (two) times daily as needed (for wheezing).     SPIRIVA HANDIHALER 18 MCG inhalation capsule  Generic drug:  tiotropium  INHALE 1 CAPSULE VIA HANDIHALER EVERY DAY     tiZANidine 4 MG tablet  Commonly known as:  ZANAFLEX  Take 8 mg by mouth every 6 (six) hours as needed for muscle spasms.     traMADol 50 MG tablet  Commonly known as:  ULTRAM  1-2  tabs by mouth up to four times per day        Percocet #30 No Refill  Disposition: Home  Patient's condition: is Good  Follow up: 1. Dr.  Trula Slade in 2 weeks.   Virgina Jock, PA-C [facility] [phone]  --- For [facility] use --- Instructions: Press F2 to tab through selections.  Delete question if not applicable.   Modified Rankin score at D/C (0-6): 0  IV medication needed for:  1. Hypertension: No 2. Hypotension: No  Post-op Complications: No  1. Post-op CVA or TIA: No  2. CN injury: No, slight left marginal mandibular neuropraxia  3. Myocardial infarction: No  4.  CHF: No  5.  Dysrhythmia (new): No  6. Wound infection: No  7. Reperfusion symptoms: No  8.  Return to OR: No  Discharge medications: Statin use:  Yes If No: [ ]  For Medical reasons, [ ]  Non-compliant, [ ]  Not-indicated ASA use:  Yes  If No: [ ]  For Medical reasons, [ ]  Non-compliant, [ ]  Not-indicated Beta blocker use:  No If No: [ ]  For Medical reasons, [ ]  Non-compliant, [x ] Not-indicated ACE-Inhibitor use:  Yes If No: [ ]  For Medical reasons, [ ]  Non-compliant, [ ]  Not-indicated P2Y12 Antagonist use: No, [ ]  Plavix, [ ]  Plasugrel, [ ]  Ticlopinine, [ ]  Ticagrelor, [ ]  Other, [ ]  No for medical reason, [ ]  Non-compliant, [ x] Not-indicated Anti-coagulant use:  No, [ ]  Warfarin, [ ]  Rivaroxaban, [ ]  Dabigatran, [ ]  Other, [ ]  No for medical reason, [ ]  Non-compliant, [x ] Not-indicated

## 2014-11-14 NOTE — Progress Notes (Signed)
  [facility] Progress Note  11/14/2014 7:39 AM 1 Day Post-Op  Subjective:  Doing well. Wants to go home.  Tmax 98.7 BP sys 110s-150s 02 96% RA  Filed Vitals:   11/14/14 0417  BP: 126/59  Pulse: 62  Temp: 98.7 F (37.1 C)  Resp: 16    Physical Exam: Incisions:  Right neck with minor swelling. No palpable hematoma. Incision clean and intact.  Extremities:  5/5 strength upper and lower extremities bilaterally. Tongue midline. Slight left facial droop.   CBC    Component Value Date/Time   WBC 4.9 11/14/2014 0514   RBC 3.74* 11/14/2014 0514   RBC 4.15* 09/23/2014 2300   HGB 8.9* 11/14/2014 0514   HCT 30.0* 11/14/2014 0514   PLT 222 11/14/2014 0514   MCV 80.2 11/14/2014 0514   MCH 23.8* 11/14/2014 0514   MCHC 29.7* 11/14/2014 0514   RDW 24.1* 11/14/2014 0514   LYMPHSABS 1.3 11/02/2014 0818   MONOABS 0.7 11/02/2014 0818   EOSABS 0.2 11/02/2014 0818   BASOSABS 0.1 11/02/2014 0818    BMET    Component Value Date/Time   NA 135 11/14/2014 0514   K 4.0 11/14/2014 0514   CL 101 11/14/2014 0514   CO2 25 11/14/2014 0514   GLUCOSE 147* 11/14/2014 0514   BUN 15 11/14/2014 0514   CREATININE 0.95 11/14/2014 0514   CALCIUM 8.3* 11/14/2014 0514   GFRNONAA >90 11/14/2014 0514   GFRAA >90 11/14/2014 0514    INR    Component Value Date/Time   INR 1.09 11/13/2014 1515     Intake/Output Summary (Last 24 hours) at 11/14/14 0739 Last data filed at 11/14/14 0600  Gross per 24 hour  Intake   3930 ml  Output   2100 ml  Net   1830 ml     Assessment:  59 y.o. male is s/p: left carotid endartectomy   1 Day Post-Op  Plan: -Neuro exam intact except for slight left facial droop. Incision without hematoma.  -Has ambulated and voided. -Vitals stable.  -Discharge home today.     Virgina Jock, PA-C [facility] Office: [phone] Pager: [phone] 11/14/2014 7:39 AM

## 2014-11-14 NOTE — Plan of Care (Signed)
Problem: Consults Goal: Diagnosis CEA/CES/AAA Stent Carotid Endarterectomy (CEA), left

## 2014-11-14 NOTE — Progress Notes (Signed)
Reviewed d/c instructions w/Pt & friend. Stressed to call the DR w/ any questions & concerns. Briefly discussed HF as well: daily Wt, etc. Pt indicated understanding but would not wait to see DR.

## 2014-11-14 NOTE — Progress Notes (Signed)
Pt refused am meds, pulled IV, ready for d/c. Will 'wait' a few more minutes for DR.

## 2014-11-16 ENCOUNTER — Telehealth: Payer: Self-pay | Admitting: Vascular Surgery

## 2014-11-16 NOTE — Telephone Encounter (Signed)
Pt complaining of right neck swelling post carotid endarterectomy on Friday by [name].  He denies any trouble breathing or stridor.  No drainage.  Pt states he thinks the swelling is similar to at discharge on Friday but his wife wanted him to call.  Told pt I could see him in the ER tonight to check this but he stated he did not want to go to the ER tonight.  Advised him he could call our office in the morning to have someone look at it and he was agreeable with this plan.  He will go to the ER tonight if things get worse.  He will stay NPO p midnight tonight in the event he needs neck drained after evaluation tomorrow.  Ruta Hinds, MD [facility] [place] Office: [phone] Pager: [phone]

## 2014-11-17 ENCOUNTER — Encounter (HOSPITAL_COMMUNITY): Payer: Self-pay | Admitting: Surgery

## 2014-12-04 ENCOUNTER — Encounter: Payer: Self-pay | Admitting: Surgery

## 2014-12-07 ENCOUNTER — Encounter: Payer: Medicare Other | Admitting: Surgery

## 2014-12-08 ENCOUNTER — Other Ambulatory Visit: Payer: Self-pay | Admitting: Internal Medicine

## 2014-12-08 NOTE — Telephone Encounter (Signed)
Faxed script back to [facility].../lmb 

## 2014-12-08 NOTE — Telephone Encounter (Signed)
Done hardcopy to Cherina  

## 2014-12-11 ENCOUNTER — Encounter: Payer: Self-pay | Admitting: Surgery

## 2014-12-14 ENCOUNTER — Ambulatory Visit (INDEPENDENT_AMBULATORY_CARE_PROVIDER_SITE_OTHER): Payer: Self-pay | Admitting: Surgery

## 2014-12-14 ENCOUNTER — Encounter: Payer: Self-pay | Admitting: Surgery

## 2014-12-14 VITALS — BP 183/80 | HR 72 | Ht 71.0 in | Wt 270.0 lb

## 2014-12-14 DIAGNOSIS — I6523 Occlusion and stenosis of bilateral carotid arteries: Secondary | ICD-10-CM

## 2014-12-14 NOTE — Progress Notes (Signed)
Patient name: Brad [name] MRN: [id number] DOB: Dec 16, 1955 Sex: male     Chief Complaint  Patient presents with  . Routine Post Op    2 wk f/u s/p L CEA    HISTORY OF PRESENT ILLNESS:  this is the Patient's first postoperative follow-up.  On 11/13/2014 he underwent left carotid endarterectomy with bovine pericardial patch angioplasty.  This was done following a left brain stroke.  He also has an MRI that shows a old right brain infarct.  CT angiogram showed bilateral 70-80 percent stenosis. I have also been following the patient for claudication. His postoperative course has been uncomplicated.  Intraoperative findings revealed 90% stenosis.  He reports that everything feels better.  He no longer has symptoms in his legs.  He denies any neurologic problems.  Past Medical History  Diagnosis Date  . Depression   . GERD (gastroesophageal reflux disease)   . Hyperlipidemia 08/27/2014    takes Simvastatin daily  . Hypertension     takes Amlodipine and Lisinopril daily  . Hypothyroidism 08/27/2014    takes Synthroid daily  . Anemia     takes Ferrous Sulfated daily  . Carotid stenosis 08/27/2014    takes Aspirin daily  . Diabetes mellitus type II     takes Metformin and Glipizide daily  . Muscle spasm     takes Zanaflex daily as needed  . Insomnia     takes Pamelor nightly  . COPD (chronic obstructive pulmonary disease)     Albuterol daily as needed and Spiriva daily  . MVP (mitral valve prolapse)   . History of blood clots     in right leg;being followed by Dr.Shaquala Broeker for this  . Shortness of breath dyspnea   . Peripheral neuropathy   . Headache     occasionally  . Dizziness   . Stroke   . Arthritis   . Joint pain     hips  . Peripheral edema     takes Furosemide daily as needed  . History of hiatal hernia   . History of gastric ulcer   . Urinary frequency   . Urinary urgency   . History of blood transfusion 09/2014    2 units-no abnormal reaction noted     Past Surgical History  Procedure Laterality Date  . Neg stress test  2000  . Rotator cuff repair Right 2009    multiple f/u Sxs/infection Tx  . Incision and drainage perirectal abscess  03/2009  . Esophagogastroduodenoscopy N/A 09/24/2014    Procedure: ESOPHAGOGASTRODUODENOSCOPY (EGD);  Surgeon: Winfield Cunas., MD;  Location: [facility] ENDOSCOPY;  Service: Endoscopy;  Laterality: N/A;  . I&d of right arm    . Endarterectomy Left 11/13/2014    Procedure: ENDARTERECTOMY CAROTID WITH PATCH ANGIOPLASTY;  Surgeon: Serafina Mitchell, MD;  Location: [facility];  Service: Vascular;  Laterality: Left;    History   Social History  . Marital Status: Single    Spouse Name: N/A  . Number of Children: 0  . Years of Education: N/A   Occupational History  . Disabled    Social History Main Topics  . Smoking status: Current Every Day Smoker -- 2.00 packs/day for 48 years    Types: Cigarettes  . Smokeless tobacco: Never Used     Comment: 1-2 pks per day  . Alcohol Use: Yes     Comment: beer monthly  . Drug Use: No  . Sexual Activity: Yes   Other Topics Concern  .  Not on file   Social History Narrative   Divorced with fiance.   Disabled since June 2010 - right shoulder   No children.          Family History  Problem Relation Age of Onset  . Alcohol abuse Father   . Lung cancer Father   . Heart disease Mother     automated implantable cardioverter-defibrillator   . Diabetes type II Mother   . Lung cancer Maternal Grandmother   . Lung cancer Sister     Allergies as of 12/14/2014 - Review Complete 12/14/2014  Allergen Reaction Noted  . Doxycycline Hives and Itching 01/22/2013  . Lipitor [atorvastatin] Other (See Comments) 08/27/2014  . Oxycodone Itching 08/08/2011    Current Outpatient Prescriptions on File Prior to Visit  Medication Sig Dispense Refill  . amLODipine (NORVASC) 5 MG tablet Take 1 tablet (5 mg total) by mouth daily. 90 tablet 3  . aspirin EC 325 MG tablet Take 1  tablet (325 mg total) by mouth daily. 30 tablet 0  . Blood Pressure Monitor DEVI Use to check Blood pressure daily 1 Device 0  . ferrous sulfate 325 (65 FE) MG tablet Take 1 tablet (325 mg total) by mouth 3 (three) times daily with meals. 30 tablet 3  . furosemide (LASIX) 40 MG tablet 1 tab by mouth in the AM, with a second dose in PM as needed only for swelling (Patient taking differently: Take 40 mg by mouth daily. Can take second dose in evening as needed for swelling) 60 tablet 11  . glipiZIDE (GLIPIZIDE XL) 2.5 MG 24 hr tablet Take 1 tablet (2.5 mg total) by mouth daily with breakfast. As needed for blood sugar > 200 90 tablet 3  . glucosamine-chondroitin 500-400 MG tablet Take 2 tablets by mouth daily.    Marland Kitchen glucose blood test strip 1 each by Other route See admin instructions. Check blood sugar once daily.    . Lancets MISC 1 each by Other route See admin instructions. Check blood sugar once daily.    Marland Kitchen levothyroxine (SYNTHROID, LEVOTHROID) 88 MCG tablet Take 1 tablet (88 mcg total) by mouth daily. 90 tablet 3  . lisinopril (PRINIVIL,ZESTRIL) 20 MG tablet Take 1 tablet (20 mg total) by mouth daily. 90 tablet 3  . metFORMIN (GLUCOPHAGE) 500 MG tablet Take 2 tablets (1,000 mg total) by mouth daily. 60 tablet 0  . nortriptyline (PAMELOR) 10 MG capsule Take 1 capsule (10 mg total) by mouth at bedtime. 90 capsule 5  . ondansetron (ZOFRAN-ODT) 8 MG disintegrating tablet Take 1 tablet (8 mg total) by mouth every 8 (eight) hours as needed for nausea or vomiting. 20 tablet 0  . pantoprazole (PROTONIX) 40 MG tablet Take 1 tablet (40 mg total) by mouth 2 (two) times daily. 60 tablet 0  . PROAIR HFA 108 (90 BASE) MCG/ACT inhaler INHALE 2 PUFFS BY MOUTH EVERY 6 HOURS AS NEEDED FOR WHEEZING OR SHORTNESS OF BREATH 8.5 g 11  . simvastatin (ZOCOR) 40 MG tablet Take 1 tablet (40 mg total) by mouth at bedtime. 90 tablet 3  . SPIRIVA HANDIHALER 18 MCG inhalation capsule INHALE 1 CAPSULE VIA HANDIHALER EVERY DAY 30  capsule 11  . tiotropium (SPIRIVA) 18 MCG inhalation capsule Place 18 mcg into inhaler and inhale 2 (two) times daily as needed (for wheezing).    Marland Kitchen tiZANidine (ZANAFLEX) 4 MG tablet Take 8 mg by mouth every 6 (six) hours as needed for muscle spasms.    . traMADol (ULTRAM) 50  MG tablet TAKE 1-2 TABLETS BY MOUTH UP TO FOUR TIMES DAILY 120 tablet 5  . oxyCODONE-acetaminophen (PERCOCET/ROXICET) 5-325 MG per tablet Take 1 tablet by mouth every 6 (six) hours as needed for moderate pain. (Patient not taking: Reported on 12/14/2014) 30 tablet 0   No current facility-administered medications on file prior to visit.     REVIEW OF SYSTEMS:  see history of present illness, otherwise negative  PHYSICAL EXAMINATION:   Vital signs are  Filed Vitals:   12/14/14 1345 12/14/14 1347  BP: 174/76 183/80  Pulse: 72   Height: '5\' 11"'$  (1.803 m)   Weight: 270 lb (122.471 kg)   SpO2: 99%    Body mass index is 37.67 kg/(m^2). General: The patient appears their stated age. HEENT:  No gross abnormalities Pulmonary:  Non labored breathing Musculoskeletal: There are no major deformities. Neurologic: No focal weakness or paresthesias are detected, Skin: There are no ulcer or rashes noted. Psychiatric: The patient has normal affect. Cardiovascular:  Carotid incision is well-healed   Diagnostic Studies  none  Assessment:  status post left carotid endarterectomy Plan:  the patient has done exceptionally well following his operation. I have recommended that he follow up in 9 months with a repeat carotid ultrasound.  He has a history of a old right brain infarct  But does not go when this occurred.  He does not have any right brain symptoms.  I recommended that he come back with a repeat ultrasound if the stenosis on the right is greater than 80%, I would proceed with endarterectomy.  Otherwise, I would continue medical management.  I have also seen the patient for claudication.  His symptoms miraculously  improved after carotid surgery.  I will see how he does of course the next several months I suspect his leg symptoms will return  V. Leia Alf, M.D. [facility] [place] Office: [phone] Pager:  [phone]

## 2014-12-15 ENCOUNTER — Other Ambulatory Visit: Payer: Self-pay | Admitting: *Deleted

## 2014-12-15 DIAGNOSIS — I6523 Occlusion and stenosis of bilateral carotid arteries: Secondary | ICD-10-CM

## 2014-12-21 ENCOUNTER — Other Ambulatory Visit (HOSPITAL_COMMUNITY): Payer: Medicare Other

## 2014-12-21 ENCOUNTER — Ambulatory Visit: Payer: Medicare Other | Admitting: Surgery

## 2015-01-28 ENCOUNTER — Encounter: Payer: Self-pay | Admitting: Surgery

## 2015-02-01 ENCOUNTER — Other Ambulatory Visit: Payer: Self-pay | Admitting: Internal Medicine

## 2015-02-04 ENCOUNTER — Other Ambulatory Visit: Payer: Self-pay | Admitting: Internal Medicine

## 2015-02-04 NOTE — Telephone Encounter (Signed)
Done erx 

## 2015-02-11 ENCOUNTER — Ambulatory Visit: Payer: Medicare Other | Admitting: Internal Medicine

## 2015-02-16 ENCOUNTER — Ambulatory Visit (INDEPENDENT_AMBULATORY_CARE_PROVIDER_SITE_OTHER): Payer: Medicare Other | Admitting: Internal Medicine

## 2015-02-16 ENCOUNTER — Encounter: Payer: Self-pay | Admitting: Internal Medicine

## 2015-02-16 ENCOUNTER — Other Ambulatory Visit (INDEPENDENT_AMBULATORY_CARE_PROVIDER_SITE_OTHER): Payer: Medicare Other

## 2015-02-16 VITALS — BP 168/80 | HR 71 | Temp 97.9°F | Resp 18 | Wt 266.0 lb

## 2015-02-16 DIAGNOSIS — M545 Low back pain, unspecified: Secondary | ICD-10-CM

## 2015-02-16 DIAGNOSIS — I10 Essential (primary) hypertension: Secondary | ICD-10-CM | POA: Diagnosis not present

## 2015-02-16 DIAGNOSIS — Z720 Tobacco use: Secondary | ICD-10-CM | POA: Diagnosis not present

## 2015-02-16 DIAGNOSIS — I639 Cerebral infarction, unspecified: Secondary | ICD-10-CM | POA: Diagnosis not present

## 2015-02-16 DIAGNOSIS — J438 Other emphysema: Secondary | ICD-10-CM

## 2015-02-16 DIAGNOSIS — R5383 Other fatigue: Secondary | ICD-10-CM

## 2015-02-16 DIAGNOSIS — E1159 Type 2 diabetes mellitus with other circulatory complications: Secondary | ICD-10-CM

## 2015-02-16 DIAGNOSIS — G8929 Other chronic pain: Secondary | ICD-10-CM

## 2015-02-16 LAB — BASIC METABOLIC PANEL
BUN: 16 mg/dL (ref 6–23)
CO2: 29 mEq/L (ref 19–32)
Calcium: 9.3 mg/dL (ref 8.4–10.5)
Chloride: 100 mEq/L (ref 96–112)
Creatinine, Ser: 0.97 mg/dL (ref 0.40–1.50)
GFR: 84.71 mL/min (ref >60.00)
Glucose, Bld: 121 mg/dL — ABNORMAL HIGH (ref 70–99)
Potassium: 3.6 mEq/L (ref 3.5–5.1)
Sodium: 137 mEq/L (ref 135–145)

## 2015-02-16 LAB — HEMOGLOBIN A1C: Hgb A1c MFr Bld: 6.1 % (ref 4.6–6.5)

## 2015-02-16 LAB — CBC WITH DIFFERENTIAL/PLATELET
Basophils Absolute: 0.1 10*3/uL (ref 0.0–0.1)
Basophils Relative: 1.7 % (ref 0.0–3.0)
Eosinophils Absolute: 0.2 10*3/uL (ref 0.0–0.7)
Eosinophils Relative: 2.8 % (ref 0.0–5.0)
HCT: 45.9 % (ref 39.0–52.0)
Hemoglobin: 15.8 g/dL (ref 13.0–17.0)
Lymphocytes Relative: 22.7 % (ref 12.0–46.0)
Lymphs Abs: 1.8 10*3/uL (ref 0.7–4.0)
MCHC: 34.4 g/dL (ref 30.0–36.0)
MCV: 80.1 fl (ref 78.0–100.0)
Monocytes Absolute: 0.7 10*3/uL (ref 0.1–1.0)
Monocytes Relative: 9 % (ref 3.0–12.0)
Neutro Abs: 5.1 10*3/uL (ref 1.4–7.7)
Neutrophils Relative %: 63.8 % (ref 43.0–77.0)
Platelets: 214 10*3/uL (ref 150.0–400.0)
RBC: 5.74 Mil/uL (ref 4.22–5.81)
RDW: 13.2 % (ref 11.5–15.5)
WBC: 8 10*3/uL (ref 4.0–10.5)

## 2015-02-16 LAB — HEPATIC FUNCTION PANEL
ALT: 39 U/L (ref 0–53)
AST: 49 U/L — ABNORMAL HIGH (ref 0–37)
Albumin: 2.8 g/dL — ABNORMAL LOW (ref 3.5–5.2)
Alkaline Phosphatase: 133 U/L — ABNORMAL HIGH (ref 39–117)
Bilirubin, Direct: 0.1 mg/dL (ref 0.0–0.3)
Total Bilirubin: 0.3 mg/dL (ref 0.2–1.2)
Total Protein: 6.5 g/dL (ref 6.0–8.3)

## 2015-02-16 LAB — TSH: TSH: 7.21 u[IU]/mL — ABNORMAL HIGH (ref 0.35–4.50)

## 2015-02-16 NOTE — Patient Instructions (Signed)
  Your next office appointment will be determined based upon review of your pending labs  and  xrays  Those written interpretation of the lab results and instructions will be transmitted to you by mail for your records.  Critical results will be called.   Followup as needed for any active or acute issue. Please report any significant change in your symptoms.Minimal Blood Pressure Goal= AVERAGE < 140/90;  Ideal is an AVERAGE < 135/85. This AVERAGE should be calculated from @ least 5-7 BP readings taken @ different times of day on different days of week. You should not respond to isolated BP readings , but rather the AVERAGE for that week .Please bring your  blood pressure cuff to office visits to verify that it is reliable.It  can also be checked against the blood pressure device at the pharmacy. Finger or wrist cuffs are not dependable; an arm cuff is.  Take amlodipine 5 mg twice a day if her blood is over 140/90 for the weekly average.  If the labs are nondiagnostic and the fatigue persists; sleep apnea evaluation should be considered because of your history of snoring.

## 2015-02-16 NOTE — Progress Notes (Signed)
Pre visit review using our clinic review tool, if applicable. No additional management support is needed unless otherwise documented below in the visit note. 

## 2015-02-16 NOTE — Progress Notes (Signed)
   Subjective:    Patient ID: Brad [name], male    DOB: 1955/09/22, 59 y.o.   MRN: [id number]  HPI He has been off his amlodipine as he thought it was causing causing bruising whilel he was working on his automobile. He has no other bleeding dyscrasias. He does not monitor his blood pressure at home. He been off the amlodipine for at least 2 weeks. He did start back on it yesterday.  He's had some headaches in the right frontal area.  He continues to smoke 1.5 packs per day; but he then says "I do have Spiriva and an inhaler".  He is describing fatigue. His wife describes snoring but not definite apnea.  He has no other cardiovascular symptoms.  Glucose fasting ranges 200-265.A1c 6.9% on 09/24/14.   Review of Systems  Chest pain, palpitations, tachycardia, exertional dyspnea, paroxysmal nocturnal dyspnea, claudication or edema are absent. Epistaxis, hemoptysis, hematuria, melena, or rectal bleeding are not described. No unexplained weight loss, significant dyspepsia,dysphagia, or abdominal pain.  There is no abnormal  bleeding or difficulty stopping bleeding with injury. He describes back pain which is chronic phenomena. It is not associated with numbness, tingling, weakness in lower extremities. He has no loss control bladder bowels.         Objective:   Physical Exam Pertinent or positive findings include: BMI; 37.12. Pattern alopecia and goatee are present. He has complete dentures. Chest is markedly quiet. Abdomen is massive and protuberant he has trace sock line edema.Minor bruising L forearm.  General appearance :adequately nourished; in no distress.  Eyes: No conjunctival inflammation or scleral icterus is present.  Oral exam:  Lips and gums are healthy appearing.There is no oropharyngeal erythema or exudate noted.   Heart:  Normal rate and regular rhythm. S1 and S2 normal without gallop, murmur, click, rub or other extra sounds    Lungs:Chest clear to  auscultation; no wheezes, rhonchi,rales ,or rubs present.No increased work of breathing.   Abdomen: bowel sounds normal, soft and non-tender without masses, organomegaly or hernias noted.  No guarding or rebound.   Vascular : all pulses equal ; no bruits present.  Skin:Warm & dry.  Intact without suspicious lesions or rashes ; no tenting or jaundice   Lymphatic: No lymphadenopathy is noted about the head, neck, axilla.   Neuro: Strength, tone normal.         Assessment & Plan:  #1 uncontrolled hypertension. His risk of stroke or heart attack being 8 times normal was discussed with him.  #2 He has been noncompliant with his BP medication.  #3 tobacco abuse; risk of 100% increased heart attack stroke discussed  #3 fatigue  #4 snoring; rule out sleep apnea  See orders and recommendations

## 2015-02-17 ENCOUNTER — Other Ambulatory Visit: Payer: Self-pay | Admitting: Internal Medicine

## 2015-02-17 DIAGNOSIS — E039 Hypothyroidism, unspecified: Secondary | ICD-10-CM

## 2015-02-17 DIAGNOSIS — E1159 Type 2 diabetes mellitus with other circulatory complications: Secondary | ICD-10-CM

## 2015-02-17 DIAGNOSIS — E8809 Other disorders of plasma-protein metabolism, not elsewhere classified: Secondary | ICD-10-CM | POA: Insufficient documentation

## 2015-02-17 DIAGNOSIS — R748 Abnormal levels of other serum enzymes: Secondary | ICD-10-CM | POA: Insufficient documentation

## 2015-02-27 ENCOUNTER — Ambulatory Visit: Payer: Medicare Other | Admitting: Family Medicine

## 2015-03-09 ENCOUNTER — Ambulatory Visit: Payer: Medicare Other | Admitting: Internal Medicine

## 2015-04-08 ENCOUNTER — Other Ambulatory Visit: Payer: Self-pay | Admitting: Internal Medicine

## 2015-05-31 NOTE — Patient Outreach (Signed)
[facility]) Care Management  05/31/2015  Brad [name] 1956-05-09 [id number]   Referral from Next Gen Tier 4 list, assigned [name], RN to outreach for [place] Management services.  Thanks, Ronnell Freshwater. [place], [place] Assistant Phone: [phone] Fax: [phone]

## 2015-06-07 ENCOUNTER — Other Ambulatory Visit: Payer: Self-pay

## 2015-06-07 NOTE — Patient Outreach (Signed)
[facility]) Care Management  06/07/2015  Brad [name] August 05, 1956 [id number]   Telephone Screen   Referral Date:  05/31/15 Referral Source:  Next Gen Tier 4 Referral Issue:  Previously a Tier 3 and moved up to Tier 4  Outreach phone call to patient.  Patient not reached [phone].  No answer following several rings.   Plan: RN CM scheduled for next outreach call.   [name], RN, BSN, [facility], CCM  [employer] Management Coordinator [phone] Direct [phone] Cell [phone] Office [phone] Fax

## 2015-06-08 ENCOUNTER — Ambulatory Visit (INDEPENDENT_AMBULATORY_CARE_PROVIDER_SITE_OTHER): Payer: Medicare Other | Admitting: Internal Medicine

## 2015-06-08 ENCOUNTER — Encounter: Payer: Self-pay | Admitting: Internal Medicine

## 2015-06-08 VITALS — BP 128/84 | HR 68 | Temp 97.9°F | Ht 71.0 in | Wt 269.0 lb

## 2015-06-08 DIAGNOSIS — M25512 Pain in left shoulder: Secondary | ICD-10-CM | POA: Insufficient documentation

## 2015-06-08 DIAGNOSIS — E785 Hyperlipidemia, unspecified: Secondary | ICD-10-CM

## 2015-06-08 DIAGNOSIS — Z23 Encounter for immunization: Secondary | ICD-10-CM | POA: Diagnosis not present

## 2015-06-08 DIAGNOSIS — I119 Hypertensive heart disease without heart failure: Secondary | ICD-10-CM

## 2015-06-08 DIAGNOSIS — E1159 Type 2 diabetes mellitus with other circulatory complications: Secondary | ICD-10-CM | POA: Diagnosis not present

## 2015-06-08 DIAGNOSIS — N320 Bladder-neck obstruction: Secondary | ICD-10-CM

## 2015-06-08 DIAGNOSIS — I639 Cerebral infarction, unspecified: Secondary | ICD-10-CM | POA: Diagnosis not present

## 2015-06-08 MED ORDER — HYDROCODONE-ACETAMINOPHEN 7.5-325 MG PO TABS: 1.0000 | ORAL_TABLET | Freq: Four times a day (QID) | ORAL | Status: DC | PRN

## 2015-06-08 NOTE — Progress Notes (Signed)
Subjective:    Patient ID: Brad [name], male    DOB: [birth year], 59 y.o.   MRN: [id number]  HPI  Here for yearly f/u;  Overall doing ok;  Pt denies Chest pain, worsening SOB, DOE, wheezing, orthopnea, PND, worsening LE edema, palpitations, dizziness or syncope.  Pt denies neurological change such as new headache, facial or extremity weakness.  Pt denies polydipsia, polyuria, or low sugar symptoms. Pt states overall good compliance with treatment and medications, good tolerability, and has been trying to follow appropriate diet.  Pt denies worsening depressive symptoms, suicidal ideation or panic. No fever, night sweats, wt loss, loss of appetite, or other constitutional symptoms.  Pt states good ability with ADL's, has low fall risk, home safety reviewed and adequate, no other significant changes in hearing or vision, and only occasionally active with exercise. No acute complaints except for worseing 2 mo left shoulder pain with ROM, severe pain worse with roM, cannot easily reach overhead Past Medical History  Diagnosis Date  . Depression   . GERD (gastroesophageal reflux disease)   . Hyperlipidemia 08/27/2014    takes Simvastatin daily  . Hypertension     takes Amlodipine and Lisinopril daily  . Hypothyroidism 08/27/2014    takes Synthroid daily  . Anemia     takes Ferrous Sulfated daily  . Carotid stenosis 08/27/2014    takes Aspirin daily  . Diabetes mellitus type II     takes Metformin and Glipizide daily  . Muscle spasm     takes Zanaflex daily as needed  . Insomnia     takes Pamelor nightly  . COPD (chronic obstructive pulmonary disease) (HCC)     Albuterol daily as needed and Spiriva daily  . MVP (mitral valve prolapse)   . History of blood clots     in right leg;being followed by Dr.Brabham for this  . Shortness of breath dyspnea   . Peripheral neuropathy ([place])   . Headache     occasionally  . Dizziness   . Stroke ([place])   . Arthritis   . Joint pain     hips  .  Peripheral edema     takes Furosemide daily as needed  . History of hiatal hernia   . History of gastric ulcer   . Urinary frequency   . Urinary urgency   . History of blood transfusion 09/2014    2 units-no abnormal reaction noted   Past Surgical History  Procedure Laterality Date  . Neg stress test  2000  . Rotator cuff repair Right 2009    multiple f/u Sxs/infection Tx  . Incision and drainage perirectal abscess  03/2009  . Esophagogastroduodenoscopy N/A 09/24/2014    Procedure: ESOPHAGOGASTRODUODENOSCOPY (EGD);  Surgeon: Winfield Cunas., MD;  Location: [facility];  Service: Endoscopy;  Laterality: N/A;  . I&d of right arm    . Endarterectomy Left 11/13/2014    Procedure: ENDARTERECTOMY CAROTID WITH PATCH ANGIOPLASTY;  Surgeon: Serafina Mitchell, MD;  Location: [facility];  Service: Vascular;  Laterality: Left;    reports that he has been smoking Cigarettes.  He has a 96 pack-year smoking history. He has never used smokeless tobacco. He reports that he drinks alcohol. He reports that he does not use illicit drugs. family history includes Alcohol abuse in his father; Diabetes type II in his mother; Heart disease in his mother; Lung cancer in his father, maternal grandmother, and sister. Allergies  Allergen Reactions  . Doxycycline Hives and Itching  .  Lipitor [Atorvastatin] Other (See Comments)    Myalgia   . Oxycodone Itching   Current Outpatient Prescriptions on File Prior to Visit  Medication Sig Dispense Refill  . amLODipine (NORVASC) 5 MG tablet Take 1 tablet (5 mg total) by mouth daily. 90 tablet 3  . aspirin EC 325 MG tablet Take 1 tablet (325 mg total) by mouth daily. (Patient taking differently: Take 81 mg by mouth daily. ) 30 tablet 0  . Blood Pressure Monitor DEVI Use to check Blood pressure daily 1 Device 0  . ferrous sulfate 325 (65 FE) MG tablet Take 1 tablet (325 mg total) by mouth 3 (three) times daily with meals. 30 tablet 3  . furosemide (LASIX) 40 MG tablet 1 tab  by mouth in the AM, with a second dose in PM as needed only for swelling (Patient taking differently: Take 40 mg by mouth daily. Can take second dose in evening as needed for swelling) 60 tablet 11  . glucosamine-chondroitin 500-400 MG tablet Take 2 tablets by mouth daily.    Marland Kitchen glucose blood test strip 1 each by Other route See admin instructions. Check blood sugar once daily.    . Lancets MISC 1 each by Other route See admin instructions. Check blood sugar once daily.    Marland Kitchen lisinopril (PRINIVIL,ZESTRIL) 20 MG tablet Take 1 tablet (20 mg total) by mouth daily. 90 tablet 3  . ondansetron (ZOFRAN-ODT) 8 MG disintegrating tablet Take 1 tablet (8 mg total) by mouth every 8 (eight) hours as needed for nausea or vomiting. 20 tablet 0  . PROAIR HFA 108 (90 BASE) MCG/ACT inhaler INHALE 2 PUFFS BY MOUTH EVERY 6 HOURS AS NEEDED FOR WHEEZING OR SHORTNESS OF BREATH 8.5 g 11  . simvastatin (ZOCOR) 40 MG tablet Take 1 tablet (40 mg total) by mouth at bedtime. 90 tablet 3  . SPIRIVA HANDIHALER 18 MCG inhalation capsule INHALE 1 CAPSULE VIA HANDIHALER EVERY DAY 30 capsule 11  . tiotropium (SPIRIVA) 18 MCG inhalation capsule Place 18 mcg into inhaler and inhale 2 (two) times daily as needed (for wheezing).    Marland Kitchen tiZANidine (ZANAFLEX) 4 MG tablet Take 8 mg by mouth every 6 (six) hours as needed for muscle spasms.    Marland Kitchen tiZANidine (ZANAFLEX) 4 MG tablet TAKE 1 TABLET BY MOUTH EVERY 6 HOURS AS NEEDED FOR MUSCLE SPASMS 60 tablet 0  . traMADol (ULTRAM) 50 MG tablet TAKE 1-2 TABLETS BY MOUTH UP TO FOUR TIMES DAILY 120 tablet 5   No current facility-administered medications on file prior to visit.    Review of Systems Constitutional: Negative for increased diaphoresis, other activity, appetite or siginficant weight change other than noted HENT: Negative for worsening hearing loss, ear pain, facial swelling, mouth sores and neck stiffness.   Eyes: Negative for other worsening pain, redness or visual disturbance.    Respiratory: Negative for shortness of breath and wheezing  Cardiovascular: Negative for chest pain and palpitations.  Gastrointestinal: Negative for diarrhea, blood in stool, abdominal distention or other pain Genitourinary: Negative for hematuria, flank pain or change in urine volume.  Musculoskeletal: Negative for myalgias or other joint complaints.  Skin: Negative for color change and wound or drainage.  Neurological: Negative for syncope and numbness. other than noted Hematological: Negative for adenopathy. or other swelling Psychiatric/Behavioral: Negative for hallucinations, SI, self-injury, decreased concentration or other worsening agitation.      Objective:   Physical Exam BP 128/84 mmHg  Pulse 68  Temp(Src) 97.9 F (36.6 C) (Oral)  Ht  $'5\' 11"'d$  (1.803 m)  Wt 269 lb (122.018 kg)  BMI 37.53 kg/m2  SpO2 98% VS noted,  Constitutional: Pt is oriented to person, place, and time. Appears well-developed and well-nourished, in no significant distress Head: Normocephalic and atraumatic.  Right Ear: External ear normal.  Left Ear: External ear normal.  Nose: Nose normal.  Mouth/Throat: Oropharynx is clear and moist.  Eyes: Conjunctivae and EOM are normal. Pupils are equal, round, and reactive to light.  Neck: Normal range of motion. Neck supple. No JVD present. No tracheal deviation present or significant neck LA or mass Cardiovascular: Normal rate, regular rhythm, normal heart sounds and intact distal pulses.   Pulmonary/Chest: Effort normal and breath sounds without rales or wheezing  Abdominal: Soft. Bowel sounds are normal. NT. No HSM  Musculoskeletal: Normal range of motion. Exhibits no edema.  Lymphadenopathy:  Has no cervical adenopathy.  Left shoulder NT but marked discomfort to abduction and forward elevation Neurological: Pt is alert and oriented to person, place, and time. Pt has normal reflexes. No cranial nerve deficit. Motor grossly intact Skin: Skin is warm and dry.  No rash noted.  Psychiatric:  Has normal mood and affect. Behavior is normal.     Assessment & Plan:

## 2015-06-08 NOTE — Progress Notes (Signed)
Pre visit review using our clinic review tool, if applicable. No additional management support is needed unless otherwise documented below in the visit note. 

## 2015-06-08 NOTE — Patient Instructions (Addendum)
You had the flu shot today  Ok to hold on the tramadol for now  Please take all new medication as prescribed - the hydrocodone  Please continue all other medications as before  Please have the pharmacy call with any other refills you may need.  Please continue your efforts at being more active, low cholesterol diet, and weight control.  We will work on sending the form to have the Cologuard done  You are otherwise up to date with prevention measures today.  You will be contacted regarding the referral for: Dr Erlinda Hong, [facility] ortho, and MRI for the left shoulder  Please keep your appointments with your specialists as you may have planned  Please go to the LAB in the Basement (turn left off the elevator) for the tests to be done today, including the Hep C

## 2015-06-09 ENCOUNTER — Other Ambulatory Visit: Payer: Self-pay | Admitting: Internal Medicine

## 2015-06-09 ENCOUNTER — Encounter: Payer: Self-pay | Admitting: Internal Medicine

## 2015-06-09 ENCOUNTER — Other Ambulatory Visit (INDEPENDENT_AMBULATORY_CARE_PROVIDER_SITE_OTHER): Payer: Medicare Other

## 2015-06-09 DIAGNOSIS — N320 Bladder-neck obstruction: Secondary | ICD-10-CM | POA: Diagnosis not present

## 2015-06-09 DIAGNOSIS — E785 Hyperlipidemia, unspecified: Secondary | ICD-10-CM | POA: Diagnosis not present

## 2015-06-09 DIAGNOSIS — E1159 Type 2 diabetes mellitus with other circulatory complications: Secondary | ICD-10-CM | POA: Diagnosis not present

## 2015-06-09 DIAGNOSIS — E039 Hypothyroidism, unspecified: Secondary | ICD-10-CM

## 2015-06-09 LAB — URINALYSIS, ROUTINE W REFLEX MICROSCOPIC
Bilirubin Urine: NEGATIVE
Ketones, ur: NEGATIVE
Leukocytes, UA: NEGATIVE
Nitrite: NEGATIVE
Specific Gravity, Urine: 1.025 (ref 1.000–1.030)
Total Protein, Urine: >=300 — AB
Urine Glucose: 250 — AB
Urobilinogen, UA: 0.2 (ref 0.0–1.0)
pH: 6 (ref 5.0–8.0)

## 2015-06-09 LAB — CBC WITH DIFFERENTIAL/PLATELET
Basophils Absolute: 0.2 10*3/uL — ABNORMAL HIGH (ref 0.0–0.1)
Basophils Relative: 2.3 % (ref 0.0–3.0)
Eosinophils Absolute: 0.2 10*3/uL (ref 0.0–0.7)
Eosinophils Relative: 2.5 % (ref 0.0–5.0)
HCT: 45.3 % (ref 39.0–52.0)
Hemoglobin: 15.4 g/dL (ref 13.0–17.0)
Lymphocytes Relative: 21.6 % (ref 12.0–46.0)
Lymphs Abs: 1.6 10*3/uL (ref 0.7–4.0)
MCHC: 34 g/dL (ref 30.0–36.0)
MCV: 80.2 fl (ref 78.0–100.0)
Monocytes Absolute: 0.5 10*3/uL (ref 0.1–1.0)
Monocytes Relative: 6.2 % (ref 3.0–12.0)
Neutro Abs: 5 10*3/uL (ref 1.4–7.7)
Neutrophils Relative %: 67.4 % (ref 43.0–77.0)
Platelets: 242 10*3/uL (ref 150.0–400.0)
RBC: 5.65 Mil/uL (ref 4.22–5.81)
RDW: 14 % (ref 11.5–15.5)
WBC: 7.4 10*3/uL (ref 4.0–10.5)

## 2015-06-09 LAB — HEPATIC FUNCTION PANEL
ALT: 34 U/L (ref 0–53)
AST: 42 U/L — ABNORMAL HIGH (ref 0–37)
Albumin: 2.8 g/dL — ABNORMAL LOW (ref 3.5–5.2)
Alkaline Phosphatase: 144 U/L — ABNORMAL HIGH (ref 39–117)
Bilirubin, Direct: 0.1 mg/dL (ref 0.0–0.3)
Total Bilirubin: 0.4 mg/dL (ref 0.2–1.2)
Total Protein: 6.8 g/dL (ref 6.0–8.3)

## 2015-06-09 LAB — PSA: PSA: 0.37 ng/mL (ref 0.10–4.00)

## 2015-06-09 LAB — MICROALBUMIN / CREATININE URINE RATIO
Creatinine,U: 128.4 mg/dL
Microalb Creat Ratio: 340.8 mg/g — ABNORMAL HIGH (ref 0.0–30.0)
Microalb, Ur: 437.5 mg/dL — ABNORMAL HIGH (ref 0.0–1.9)

## 2015-06-09 LAB — LIPID PANEL
Cholesterol: 168 mg/dL (ref 0–200)
HDL: 30.6 mg/dL — ABNORMAL LOW (ref >39.00)
NonHDL: 136.93
Total CHOL/HDL Ratio: 5
Triglycerides: 228 mg/dL — ABNORMAL HIGH (ref 0.0–149.0)
VLDL: 45.6 mg/dL — ABNORMAL HIGH (ref 0.0–40.0)

## 2015-06-09 LAB — BASIC METABOLIC PANEL
BUN: 16 mg/dL (ref 6–23)
CO2: 29 mEq/L (ref 19–32)
Calcium: 9.2 mg/dL (ref 8.4–10.5)
Chloride: 100 mEq/L (ref 96–112)
Creatinine, Ser: 1.02 mg/dL (ref 0.40–1.50)
GFR: 79.85 mL/min (ref >60.00)
Glucose, Bld: 223 mg/dL — ABNORMAL HIGH (ref 70–99)
Potassium: 4.3 mEq/L (ref 3.5–5.1)
Sodium: 135 mEq/L (ref 135–145)

## 2015-06-09 LAB — TSH: TSH: 10.37 u[IU]/mL — ABNORMAL HIGH (ref 0.35–4.50)

## 2015-06-09 LAB — LDL CHOLESTEROL, DIRECT: Direct LDL: 92 mg/dL

## 2015-06-09 LAB — HEMOGLOBIN A1C: Hgb A1c MFr Bld: 7.7 % — ABNORMAL HIGH (ref 4.6–6.5)

## 2015-06-09 MED ORDER — LEVOTHYROXINE SODIUM 112 MCG PO TABS: 112.0000 ug | ORAL_TABLET | Freq: Every day | ORAL | Status: DC

## 2015-06-09 MED ORDER — METFORMIN HCL ER 500 MG PO TB24: ORAL_TABLET | ORAL | Status: DC

## 2015-06-12 NOTE — Assessment & Plan Note (Signed)
stable overall by history and exam, recent data reviewed with pt, and pt to continue medical treatment as before,  to f/u any worsening symptoms or concerns Lab Results  Component Value Date   HGBA1C 7.7* 06/09/2015

## 2015-06-12 NOTE — Assessment & Plan Note (Signed)
?   DJD vs rot cuff - for pain control, MRI, refer orthopedic,  to f/u any worsening symptoms or concerns

## 2015-06-12 NOTE — Assessment & Plan Note (Signed)
stable overall by history and exam, recent data reviewed with pt, and pt to continue medical treatment as before,  to f/u any worsening symptoms or concerns Lab Results  Component Value Date   LDLCALC 38 09/24/2014    

## 2015-06-12 NOTE — Assessment & Plan Note (Signed)
stable overall by history and exam, recent data reviewed with pt, and pt to continue medical treatment as before,  to f/u any worsening symptoms or concerns BP Readings from Last 3 Encounters:  06/08/15 128/84  02/16/15 168/80  12/14/14 183/80

## 2015-06-13 ENCOUNTER — Other Ambulatory Visit: Payer: Self-pay | Admitting: Internal Medicine

## 2015-06-14 ENCOUNTER — Other Ambulatory Visit: Payer: Self-pay

## 2015-06-14 DIAGNOSIS — E118 Type 2 diabetes mellitus with unspecified complications: Secondary | ICD-10-CM

## 2015-06-14 NOTE — Patient Outreach (Addendum)
[facility]) Care Management  06/14/2015  Brad [name] 1956-05-31 [id number]   Telephonic Care Management   Referral Date: 05/31/15 Referral Source: Next Gen Tier 4 Referral Issue: Previously a Tier 3 and moved up to Tier 4  Outreach phone call to patient. Patient reached [phone].  Primary MD:  [name]  - Last appt 06/08/15. Pulmonologist:  None Cardiologist:  Neurologist:  [name] - appt pending.    [facility].   Psycho/Social:  Patient lives in his home with his wife.  C/O Short memory with difficulty remembering what he die yesterday due to 3 past strokes.  Patient's speech is clear and answers all questions appropriately today.  Mobility:  Ambulates with no assistive devices.  Falls:  none Caregiver:  Wife,  DME:  [employer] Insurance:  Medicare, [employer] [place] for Part D, Medicare Extra Help and [place] Medicaid. Resources:  SSD  COPD (2001-2002) Pulmonologist:  None; managed by Primary MD.  States he uses his Inhaler and Spiriva.  States decreased activity due to chronic shortness of breath with exertion and pain in his legs from past cardio/vascular issues.  States this has resolved since having "leg surgery".   Smoker: yes 1-2 packs per day.  CHF Patient states current weight 269 and "I know I should be down around 198 but I am not able to exercise."  Patient is not sure what weight his MD recommends as a target goal weight.    Diabetes  Patient states he checks his BS every couple of days.  Blood Sugars running 218-230.  Weight 269  Height 5'11" A1C:  7.7 (up from 6.1 in July 2016).  H/o 9.04 February 2014).  Diet includes regular soda's, white bread, ice cream, sweet tea.   Pain Left shoulder bump, swelling and pain.  Primary MD referring to [name] for possible MRI to further assess.  H/o Right shoulder rotator cuff surgery went bad which    Social Security Disability.  Tramadol Patient does not  know what to do.   Medications:  More than 10 medications  FLU Vaccine:  06/08/15  Plan: Screening and Initial Assessment completed 06/14/2015.  Telephonic Care Management Level 3; Program:  Diabetes 06/14/2015.  Diabetes Emmi Education mailed -DIABETES CARE CHECKLIST -DIABETES: WHY CHECK YOUR BLOOD SUGAR? -DIABETES: WHY GET YOUR A1C CHECKED? -COUNTING CARBOHYDRATES RN CM mailed BP and weight logging sheet  Smoking Cessation Emmi EducatIon mailed -SMOKING CESSATION MEDICATIONS -SMOKING - TIPS ON HOW TO QUIT  RN CM sent [place] 06/14/2015. -More than 10 medications.   RN CM instructed patient in next contact call for Initial Assessment within 30 days. RN CM will follow-up on care coordination needs:   -Referral to [name].  RN CM notified [place] Management Assistant: agreed to services/case opened. RN CM sent successful outreach letter, [facility] Introductory package and consent for service form.  RN CM sent Physician Involvement letter and Initial Assessment to Primary MD.   RN CM advised to please notify MD of any changes in condition prior to scheduled appt's.   RN CM provided contact name and # [phone] or main office # [phone] and 24-hour nurse line # [phone].  RN CM confirmed patient is aware of 911 services for urgent emergency needs.  [name], RN, BSN, [facility], CCM  [employer] Management Coordinator [phone] Direct [phone] Cell [phone] Office [phone] Fax

## 2015-06-14 NOTE — Patient Outreach (Signed)
[facility]) Care Management  06/14/2015  Brad [name] [birth year] [id number]  Care Coordination Note:   Phone call to patient.  Issue: Patient is not clear on instructions for Referral to Dr Erlinda Hong. Patient is not sure who will be calling him to arrange this appointment or if he should make the call for  RN CM advised of contact with [name] Office Contact regarding confirmation on Referral to Specialist for shoulder pain.  MD plans referral to Orthopedic Surgeon, Dr. Azucena Cecil, [facility].  Request for MRI authorization in process.  [name] referral coordinators Marye Round or Stanton Kidney) will call patient once plan in place.  Patient verbalized understanding of instructions.    COPD Issue:  Patient requested RN CM's assistance on another request.  Patient states he would like to have a nebulizer to help break up COPD secretions.  States he is using his inhalers and all other medications but thinks this would provide additional improvement.  Patient confirms he is not due to see Primary MD for another 3 months.   Plan RN CM instructed patient that RN CM will contact MD to discuss options and recommendations.   [name], RN, BSN, [facility], CCM  [employer] Management Coordinator [phone] Direct [phone] Cell [phone] Office [phone] Fax

## 2015-06-14 NOTE — Patient Outreach (Signed)
Brad [name]) Care Management  06/14/2015  Brad [name] [birth year] [id number]   Request from Brad Laster, RN to assign Pharmacy, assigned Brad [name], PharmD.  Thanks, Brad [name]. [place], [place] Assistant Phone: [phone] Fax: [phone]

## 2015-06-14 NOTE — Patient Outreach (Signed)
[facility]) Care Management  06/14/2015  KIPPY GOHMAN Jan 03, 1956 [id number]   Care Coordination Note:   Contact call to Primary MD, Dr. [name] office contact.  Issue:  Patient is not clear on instructions for Referral to Dr Erlinda Hong.  Patient is not sure who will be calling him to arrange this appointment or if he should make the call for  Office Contact states Plan of referral to Orthopedic Surgeon, Dr. Azucena Cecil, [facility].  Request for MRI authorization in process.  [name] referral coordinators Marye Round or Stanton Kidney) to call patient once plan in place.   Plan: RN CM will notify patient of this update via phone call.    [name], RN, BSN, [facility], CCM  [employer] Management Coordinator [phone] Direct [phone] Cell [phone] Office [phone] Fax

## 2015-06-14 NOTE — Telephone Encounter (Signed)
Please advise, thanks.

## 2015-06-15 NOTE — Telephone Encounter (Signed)
Rx faxed to pharmacy  

## 2015-06-15 NOTE — Telephone Encounter (Signed)
zanaflex done erx  Tramadol Done hardcopy to [facility]

## 2015-06-23 ENCOUNTER — Other Ambulatory Visit: Payer: Self-pay | Admitting: Pharmacist

## 2015-06-23 NOTE — Patient Outreach (Addendum)
[facility] [facility]) Care Management  [place]   06/25/2015  STEPHENS SHREVE 01-May-1956 [id number]  Subjective: Brad [name] is a 59 y.o. male referred to pharmacy for medication review. Per EPIC, patient with type 2 diabetes, hypertensive heart disease without heart failure, hyperlipidemia, hypothyroidism, COPD, GERD, left shoulder pain and bladder neck obstruction. Reviewed patient's medication list, problem list and allergies per EPIC to perform this medication review.   Objective:   Current Medications: Current Outpatient Prescriptions  Medication Sig Dispense Refill  . amLODipine (NORVASC) 5 MG tablet Take 1 tablet (5 mg total) by mouth daily. 90 tablet 3  . aspirin EC 325 MG tablet Take 1 tablet (325 mg total) by mouth daily. (Patient taking differently: Take 81 mg by mouth daily. ) 30 tablet 0  . Blood Pressure Monitor DEVI Use to check Blood pressure daily 1 Device 0  . ferrous sulfate 325 (65 FE) MG tablet Take 1 tablet (325 mg total) by mouth 3 (three) times daily with meals. 30 tablet 3  . furosemide (LASIX) 40 MG tablet 1 tab by mouth in the AM, with a second dose in PM as needed only for swelling (Patient taking differently: Take 40 mg by mouth daily. Can take second dose in evening as needed for swelling) 60 tablet 11  . glucosamine-chondroitin 500-400 MG tablet Take 2 tablets by mouth daily.    Marland Kitchen glucose blood test strip 1 each by Other route See admin instructions. Check blood sugar once daily.    Marland Kitchen HYDROcodone-acetaminophen (NORCO) 7.5-325 MG tablet Take 1 tablet by mouth every 6 (six) hours as needed for moderate pain. 60 tablet 0  . Lancets MISC 1 each by Other route See admin instructions. Check blood sugar once daily.    Marland Kitchen levothyroxine (SYNTHROID, LEVOTHROID) 112 MCG tablet Take 1 tablet (112 mcg total) by mouth daily. 90 tablet 3  . lisinopril (PRINIVIL,ZESTRIL) 20 MG tablet Take 1 tablet (20 mg total) by mouth daily. 90 tablet 3  . metFORMIN  (GLUCOPHAGE-XR) 500 MG 24 hr tablet 3 tabs by mouth in the AM 270 tablet 3  . ondansetron (ZOFRAN-ODT) 8 MG disintegrating tablet Take 1 tablet (8 mg total) by mouth every 8 (eight) hours as needed for nausea or vomiting. 20 tablet 0  . PROAIR HFA 108 (90 BASE) MCG/ACT inhaler INHALE 2 PUFFS BY MOUTH EVERY 6 HOURS AS NEEDED FOR WHEEZING OR SHORTNESS OF BREATH 8.5 g 11  . simvastatin (ZOCOR) 40 MG tablet Take 1 tablet (40 mg total) by mouth at bedtime. 90 tablet 3  . SPIRIVA HANDIHALER 18 MCG inhalation capsule INHALE 1 CAPSULE VIA HANDIHALER EVERY DAY 30 capsule 11  . tiotropium (SPIRIVA) 18 MCG inhalation capsule Place 18 mcg into inhaler and inhale 2 (two) times daily as needed (for wheezing).    Marland Kitchen tiZANidine (ZANAFLEX) 4 MG tablet Take 8 mg by mouth every 6 (six) hours as needed for muscle spasms.    Marland Kitchen tiZANidine (ZANAFLEX) 4 MG tablet TAKE 1 TABLET BY MOUTH EVERY 6 HOURS AS NEEDED FOR MUSCLE SPASMS 60 tablet 0  . traMADol (ULTRAM) 50 MG tablet TAKE 1 TO 2 TABLETS BY MOUTH UP TO FOUR TIMES DAILY 120 tablet 0   No current facility-administered medications for this visit.    Assessment:   Drugs sorted by system:  Cardiovascular: amlodipine, aspirin, furosemide, lisinopril, simvastatin  Pulmonary/Allergy: Proair, Spiriva  Gastrointestinal: ondansetron  Endocrine: levothyroxine, metformin  Pain: Norco, tizanidine, tramadol  Vitamins/Minerals: ferrous sulfate, glucosamine-chondroitin   Duplications  in therapy: tramadol and Norco if co-administered  Gaps in therapy: Based on the American Heart Association estimate 10-year risk for atherosclerotic cardiovascular disease (ASCVD) calculator, Mr. Kundinger has a calculated 10-year risk of 29.5%. Patient currently on a moderate-intensity statin. Based on this risk estimate, recommend a high intensity statin for this patient. Note that patient has a history of myalgia with atorvastatin.   Drug interactions: . Amlodipine + simvastatin:  amlodipine may increase the serum concentration of simvastatin. Daily adult simvastatin doses greater than 20 mg should be avoided in amlodipine treated patients. . CYP1A2 Inhibitors, such as amlodipine and ondansetron, may increase the serum concentration of tizanidine. Monitor for increased effects of tizanidine, including adverse reactions (eg, hypotension, bradycardia, drowsiness). . Iron Salts may decrease the serum concentration of levothyroxine. Separate the oral administration of iron salts and levothyroxine by at least 4 hours. Note that patient's PCP increased his levothyroxine dose at his last visit. Lebron Quam, tramadol, tizanidine: CNS Depressants may enhance the CNS depressant effect of other CNS depressants.   Plan:  1) Will contact patient to discuss potential sedative effects of tizanidine, tramadol and Norco, particularly when used together. Will also discuss with patient his current administration of levothyroxine and ferrous sulfate to ensure that the timing of his taking these two agents is consistent.  2) Based on patient's estimate 10-year risk for atherosclerotic cardiovascular disease (ASCVD) of 29.5%, will contact patient's PCP to recommend use of a high intensity statin, such as Crestor 20 or 40 mg daily rather than simvastatin. Note that this would also avoid the current drug interaction between patient's simvastatin and amlodipine. Communication sent to [name] within this encounter.  Harlow Asa, PharmD Clinical Pharmacist [place] [facility] [phone]

## 2015-06-24 ENCOUNTER — Other Ambulatory Visit: Payer: Self-pay | Admitting: Internal Medicine

## 2015-06-24 ENCOUNTER — Telehealth: Payer: Self-pay | Admitting: *Deleted

## 2015-06-24 DIAGNOSIS — Z0000 Encounter for general adult medical examination without abnormal findings: Secondary | ICD-10-CM

## 2015-06-24 NOTE — Telephone Encounter (Signed)
Notified pt with md response. He stated that he wanting to have the MRI first then he will see orthopedic md Dr. Francoise Schaumann

## 2015-06-24 NOTE — Telephone Encounter (Signed)
Called back.  Would like to know HEP C results as well as refill on Hydrocodone.

## 2015-06-24 NOTE — Telephone Encounter (Signed)
There are no recent Hep C results  Ok to have drawn at any time  I am uncomfortable with refilling the hydrocodone unless pt has seen orthopedic; has he been seen?

## 2015-06-24 NOTE — Telephone Encounter (Signed)
Left msg on triage requesting refill on his hydrocodone...Brad [name]

## 2015-06-25 ENCOUNTER — Telehealth: Payer: Self-pay | Admitting: Internal Medicine

## 2015-06-25 ENCOUNTER — Other Ambulatory Visit: Payer: Self-pay | Admitting: Pharmacist

## 2015-06-25 MED ORDER — HYDROCODONE-ACETAMINOPHEN 7.5-325 MG PO TABS: 1.0000 | ORAL_TABLET | Freq: Four times a day (QID) | ORAL | Status: DC | PRN

## 2015-06-25 NOTE — Telephone Encounter (Signed)
Shirlean Mylar called in to advise that the patient is in pain and cannot wait until the 5th. She pleads that the patient needs something to help with his pain in the meantime.

## 2015-06-25 NOTE — Telephone Encounter (Signed)
Pt informed, Rx in cabinet for pt pick up  

## 2015-06-25 NOTE — Patient Outreach (Signed)
Called Brad [name] to discuss potential sedative effects of tizanidine, tramadol and Norco, particularly when used together. Brad [name] reports that he tries to avoid needing to use each of these medications. Reports that he has a shoulder injury and needs to use the medications when he overuses the joint. Counseled Brad [name] about the sedation risk of each medication and particularly in combination. Brad [name] states that he does not experience significant sedation when using the tramadol or tizanidine alone. However, reports that he does feel sleepy when he needs the tizanidine and tramadol together. Reports that he manages this by resting. Denies any falls. Reports that he does not use the Norco and tramadol at the same time for pain, but would use one or the other depending on the severity of the pain.  Also discussed with Brad [name] his current administration of levothyroxine and ferrous sulfate to ensure that the timing of his taking these two agents is consistent. Brad [name] reports that he does consistently take the levothyroxine and first dose of ferrous sulfate together each morning and has been doing this for a long time. Spoke with Brad [name] about the potential effect on his thyroid level. Brad [name] states that he has been taking the increased dose of levothyroxine since his visit with [name] on 06/09/15. Counseled Brad [name] to be consistent with the timing of taking these medications and that [name] will advise him if it is necessary to adjust this timing. Note that I mentioned this interaction in my communication with [name] in my medication review encounter.  Brad [name] reports that he has no further questions for me.   Harlow Asa, PharmD Clinical Pharmacist [place] [facility] [phone]

## 2015-06-25 NOTE — Telephone Encounter (Signed)
Done hardcopy to Dahlia  

## 2015-07-08 ENCOUNTER — Other Ambulatory Visit: Payer: Self-pay | Admitting: Internal Medicine

## 2015-07-08 DIAGNOSIS — M25512 Pain in left shoulder: Secondary | ICD-10-CM | POA: Diagnosis not present

## 2015-07-12 ENCOUNTER — Other Ambulatory Visit: Payer: Self-pay

## 2015-07-12 VITALS — Ht 71.0 in | Wt 267.0 lb

## 2015-07-12 DIAGNOSIS — Z1212 Encounter for screening for malignant neoplasm of rectum: Secondary | ICD-10-CM | POA: Diagnosis not present

## 2015-07-12 DIAGNOSIS — J441 Chronic obstructive pulmonary disease with (acute) exacerbation: Secondary | ICD-10-CM

## 2015-07-12 DIAGNOSIS — Z1211 Encounter for screening for malignant neoplasm of colon: Secondary | ICD-10-CM | POA: Diagnosis not present

## 2015-07-12 NOTE — Patient Outreach (Addendum)
[place] [facility]) Care Management  07/12/2015  MORAD TAL 11/09/1955 [id number]   Telephonic Monthly Assessment  Referral Date: 05/31/15 Referral Source: Next Gen Tier 4 Referral Issue: Previously a Tier 3 and moved up to Tier 4  Providers: Primary MD: [name] - Last appt 06/08/15. Pulmonologist: None Cardiologist:  Neurologist: [name] - Initial appt scheduled for 08/2015.   Pharmacy [place].  Insurance:  Medicare, [employer] [place] for Part D, Medicare "Extra Help"   Psycho/Social: Patient lives in his home with his wife. C/O Short memory with difficulty remembering what he die yesterday due to 3 past strokes. Patient's speech is clear and answers all questions appropriately today.  Mobility: Ambulates with no assistive devices. States decreased activity due to chronic shortness of breath with exertion and pain in his legs from past cardio/vascular issues. States this has resolved since having "leg surgery" Falls: none Caregiver: Wife,  DME: [employer] Resources: SSD  COPD (2001-2002) Pulmonologist: None; managed by Primary MD.  States he uses his Inhaler and Spiriva. .  Smoker: yes; 2 packs per day.  Patient states he tried the patch in 2000 but was not successful and was not completely motivated.  States he is interested in smoking cessation but the patch and prescription meds prescribed in the past are close to $100.00 a month and he can not afford this cost.     CHF Current weight 267 Height 5'11" Patient aware of need for weight loss.   "I know I should be down around 198 but I am not able to exercise." Patient is not sure what weight his MD recommends as a target goal weight (no MD appt's since last RN CM contact call).    Diabetes  Patient states he checks his BS every couple of days. Blood Sugars running 218-230.  A1C: 7.7 on 06/09/2015.   (up from 6.1 in July 2016). H/o 9.04 February 2014).   BP: 128/84 on 06/14/2015.  Diet includes regular soda's, white bread, ice cream, sweet tea.  Patient confirms he reviewed the educational mailings and states he likes breads and spaghetti and has to give up a lot of things he likes.   Pain Left shoulder bump, swelling and pain.  Primary MD referring to [name] for possible MRI to further assess.  H/o Right shoulder rotator cuff surgery went bad and patient now on Social Security Disability.  Tramadol & Hydrocodone used for pain management.  Appt pending with [name] 08/2015 for arm and appt 09/2015 for leg follow-up.   Medications:  More than 10 medications / [place] referral for medication review (completed 06/25/15)   FLU Vaccine: 06/08/15 Patient states he received a letter from his insurance provider advising as of 08/08/2015 Spiriva will no longer be on the covered formulary and MD will need to change to another covered medication.  Patient states he would rather use a nebulizer to help break up COPD secretions. States he is using his inhalers and all other medications but thinks this would provide additional improvement or work better than what he is currently using. Patient confirms he is not due to see Primary MD until approximately his 3 month recommended follow-up appt around 09/2015.  Patient states he will take medication formulary list to provider over the next couple of days.  States enough medication to last through December and will need new prescription for January.   Consent: Successful outreach letter, [facility] Introductory package and consent for service form  sent 06/14/2015.  Patient received but has not completed and returned to [facility].  Patient gives verbal consent to continue with [facility] services.   Plan: Screening and Initial Assessment completed 06/14/2015.  Telephonic Care Management Level 3 Program: Diabetes 06/14/2015 - 07/12/15 Program:  COPD 07/12/15  Diabetes Emmi Education reviewed 07/12/2015.  -DIABETES CARE  CHECKLIST -DIABETES: WHY CHECK YOUR BLOOD SUGAR? -DIABETES: WHY GET YOUR A1C CHECKED? -COUNTING CARBOHYDRATES RN CM mailed BP and weight logging sheet  Smoking Cessation Emmi Education reviewed 07/12/2015 -SMOKING CESSATION MEDICATIONS -SMOKING - TIPS ON HOW TO QUIT RN CM discussed patient's goals & motivation to quit smoking.    COPD RN CM will contact Primary MD regarding patient's request for a nebulizer  Medications RN CM advised patient to go ahead and get list to MD so that MD has time to call into pharmacy to have refill ready by 08/08/2015.   RN CM will follow-up next contact call to confirm if patient provided MD with Medication formulary list to replace Spiriva with covered medication.  RN CM sent [place] Referral regarding smoking cessation needs and patients C/O cost of smoking cessation medications.   RN CM will follow-up with telephonic monthly assessment within one month and care coordination services as needs are indicted within the next 30 days.  RN CM advised to please notify MD of any changes in condition prior to scheduled appt's.  RN CM provided contact name and # [phone] or main office # [phone] and 24-hour nurse line # [phone].  RN CM confirmed patient is aware of 911 services for urgent emergency needs.

## 2015-07-13 ENCOUNTER — Telehealth: Payer: Self-pay | Admitting: *Deleted

## 2015-07-13 MED ORDER — HYDROCODONE-ACETAMINOPHEN 7.5-325 MG PO TABS: 1.0000 | ORAL_TABLET | Freq: Four times a day (QID) | ORAL | Status: DC | PRN

## 2015-07-13 NOTE — Telephone Encounter (Signed)
Ok, but please let pt know that I do not prescribe narcotic medication for long term , so the limit I can do is 3 months only, after that I will decline to do further refills; pt needs to f/u with surgury for the shoulder, or allow referral to pain clinic if needs med long term

## 2015-07-13 NOTE — Telephone Encounter (Signed)
Notified pt wife with md response. Rx place up fron for pick-up...Brad [name]

## 2015-07-13 NOTE — Telephone Encounter (Signed)
Received call from pt wife stating pt saw [name] on last Friday he stated that he is pretty sure he needs surgery on his shoulder. He made appt for MRI for 12/8, but he is out of pain med Dr Erlinda Hong states md need to refill until he have surgery...Johny Chess

## 2015-07-15 ENCOUNTER — Ambulatory Visit
Admission: RE | Admit: 2015-07-15 | Discharge: 2015-07-15 | Disposition: A | Discharge status: Home / Self Care | Payer: Medicare Other | Source: Ambulatory Visit | Attending: Internal Medicine | Admitting: Internal Medicine

## 2015-07-15 DIAGNOSIS — M25512 Pain in left shoulder: Secondary | ICD-10-CM

## 2015-07-15 DIAGNOSIS — S4382XA Sprain of other specified parts of left shoulder girdle, initial encounter: Secondary | ICD-10-CM | POA: Diagnosis not present

## 2015-07-16 ENCOUNTER — Encounter: Payer: Self-pay | Admitting: Internal Medicine

## 2015-07-17 LAB — COLOGUARD: Cologuard: NEGATIVE

## 2015-07-20 DIAGNOSIS — M25512 Pain in left shoulder: Secondary | ICD-10-CM | POA: Diagnosis not present

## 2015-07-22 ENCOUNTER — Other Ambulatory Visit: Payer: Self-pay | Admitting: Orthopaedic Surgery

## 2015-07-22 DIAGNOSIS — M25522 Pain in left elbow: Secondary | ICD-10-CM

## 2015-07-22 DIAGNOSIS — M25512 Pain in left shoulder: Secondary | ICD-10-CM

## 2015-07-28 ENCOUNTER — Other Ambulatory Visit: Payer: Self-pay | Admitting: Pharmacist

## 2015-07-28 NOTE — Patient Outreach (Addendum)
Received a referral from [place] to outreach to this patient regarding his interest in smoking cessation. No voicemail available. Unable to leave a message.Will give him another call in one week.  Harlow Asa, PharmD Clinical Pharmacist [place] [facility] [phone]

## 2015-08-03 ENCOUNTER — Other Ambulatory Visit: Payer: Self-pay | Admitting: Internal Medicine

## 2015-08-03 NOTE — Telephone Encounter (Signed)
Rx faxed to pharmacy  

## 2015-08-03 NOTE — Telephone Encounter (Signed)
pts fiance called stating he would like the refill for tramadol because it makes him less drowsy

## 2015-08-03 NOTE — Telephone Encounter (Signed)
Done hardcopy to Dahlia  

## 2015-08-04 ENCOUNTER — Encounter: Payer: Self-pay | Admitting: Internal Medicine

## 2015-08-05 ENCOUNTER — Other Ambulatory Visit: Payer: Self-pay | Admitting: Orthopaedic Surgery

## 2015-08-05 ENCOUNTER — Ambulatory Visit
Admission: RE | Admit: 2015-08-05 | Discharge: 2015-08-05 | Disposition: A | Discharge status: Home / Self Care | Payer: Medicare Other | Source: Ambulatory Visit | Attending: Orthopaedic Surgery | Admitting: Orthopaedic Surgery

## 2015-08-05 DIAGNOSIS — M25522 Pain in left elbow: Secondary | ICD-10-CM | POA: Diagnosis not present

## 2015-08-05 DIAGNOSIS — M25512 Pain in left shoulder: Secondary | ICD-10-CM

## 2015-08-05 DIAGNOSIS — M75112 Incomplete rotator cuff tear or rupture of left shoulder, not specified as traumatic: Secondary | ICD-10-CM | POA: Diagnosis not present

## 2015-08-05 DIAGNOSIS — M6289 Other specified disorders of muscle: Secondary | ICD-10-CM | POA: Diagnosis not present

## 2015-08-05 MED ORDER — GADOBENATE DIMEGLUMINE 529 MG/ML IV SOLN
20.0000 mL | Freq: Once | INTRAVENOUS | Status: AC | PRN
  Administered 2015-08-05: 20 mL via INTRAVENOUS

## 2015-08-06 ENCOUNTER — Other Ambulatory Visit: Payer: Self-pay | Admitting: Orthopaedic Surgery

## 2015-08-06 ENCOUNTER — Other Ambulatory Visit: Payer: Self-pay | Admitting: Pharmacist

## 2015-08-06 DIAGNOSIS — M71322 Other bursal cyst, left elbow: Secondary | ICD-10-CM

## 2015-08-06 NOTE — Patient Outreach (Signed)
Received a referral from [place] to outreach to this patient regarding his interest in smoking cessation. Spoke with Mr. Nott regarding smoking cessation. Patient reports that he has been smoking since he was 59 years old. Reports that he tried the patches in the past to help him to quit, but found them to be costly. Discussed with patient resources for smoking cessation such as the [place] Quit Line and smoking cessation classes. Patient reports that he has this information already. Reports that his fiancee also smokes and that this is a barrier for him. Patient states "I can quit anytime that I want to". However, patient expresses that he does not know if now is the time.   Let patient know to please call me with questions in the future. Will close this pharmacy episode for now.  Harlow Asa, PharmD Clinical Pharmacist [place] [facility] [phone]

## 2015-08-10 ENCOUNTER — Other Ambulatory Visit: Payer: Self-pay

## 2015-08-10 ENCOUNTER — Telehealth: Payer: Self-pay | Admitting: Internal Medicine

## 2015-08-10 ENCOUNTER — Ambulatory Visit: Payer: Self-pay

## 2015-08-10 NOTE — Patient Outreach (Signed)
[facility]) Care Management  08/10/2015  Brad [name] July 02, 1956 [id number]  Care Coordination Services  Outreach call to Primary MD, Dr. [name]. Office contact:  Kathrynn Speed CM requested the following:    -nebulizer option - please discuss with MD to confirm if MD can order or is patient appt needed first.  If able to order; please send order to [place].  -next Primary MD appt confirmation:  [name] confirms MD ordered 6 month follow-up on last office visit 06/08/2015.   RN CM requested appt date:  Scheduled for Tuesday, May 2 @ 2:15pm  -Hep C order in place:  Yes MD has sent order to lab.  Patient may go to lab to complete at any time.  -Cologuard results received? Yes. Results received and in chart.  MD will discuss with patient on next MD appt.   Plan:  RN CM will follow-up with patient via phone to provide updates.   [name], RN, BSN, [facility], CCM  [employer] [employer] Management Coordinator [phone] Direct [phone] Cell [phone] Office [phone] Fax

## 2015-08-10 NOTE — Patient Outreach (Addendum)
[facility]) Care Management  08/10/2015  Brad [name] [birth year] [id number]   Telephonic Monthly Assessment  Referral Date: 05/31/15 Referral Source: Next Gen Tier 4 Referral Issue: Previously a Tier 3 and moved up to Tier 4  Providers: Primary MD: [name] [phone] - Last appt 06/08/15 - Patient thinks his next appt is in 3 months but is not sure.   Pulmonologist: None Cardiologist:  Neurologist: [name] - next appt 08/17/15   Pharmacy [place].  Insurance: Medicare, [employer] for Part D, Medicare "Extra Help"   Psycho/Social: Patient lives in his home with his wife. H/o c/o short memory with difficulty remembering what he did yesterday due to h/o past strokes. Patient's speech is clear and answers all questions appropriately today.  Mobility: Ambulates with no assistive devices. States decreased activity due to chronic shortness of breath with exertion and pain in his legs from past cardio/vascular issues.  Falls: none Caregiver: Wife,  DME: [employer] Resources: SSD  COPD (2001-2002) Pulmonologist: None; managed by Primary MD.   Smoker: yes since age 70yo. Smoking 2 packs per day. 2nd hand smoke: yes:  Fiancee.  Patient states he tried the patch in 2000 but was not successful and was not completely motivated. States he is interested in smoking cessation but the patch and prescription meds prescribed in the past are close to $100.00 a month and he can not afford this cost. H/o [facility] Pharmacist consult call completed 08/06/15 to discuss resources for smoking cessation such as the [employer] and smoking cessation classes. H/o Patient reported that he already had this information.  Patient does not know if now is the time to work on quitting smoking.  Smoking Cessation Emmi Education reviewed 07/12/2015 -SMOKING CESSATION MEDICATIONS -SMOKING - TIPS ON HOW TO QUIT  Spiriva:  Patient states  he received a letter from his insurance provider advising as of 08/08/2015 Spiriva will no longer be on the covered formulary and MD will need to change to another covered medication.  Nebulizer:  Patient states he would rather use a nebulizer to help break up COPD secretions. States he is using his inhalers and all other medications but thinks this would provide additional improvement or work better than what he is currently using. Patient reported on last RN CM contact call that he would take medication formulary list to provider over the next couple of days; patient states he was able to get most recent Spiriva refill and has not taken letter to MD yet.   Patient c/o having symptoms of a cold for 3 days.  States he will follow-up with MD if feels more than a cold.   CHF Current weight 267 Height 5'11" Patient aware of need for weight loss. "I know I should be down around 198 but I am not able to exercise." Patient is not sure what weight his MD recommends as a target goal weight (no MD appt's since last RN CM contact call).   Diabetes  Patient states he checks his BS every couple of days. Blood Sugars running 218-230.  A1C: 7.7 on 06/09/2015. (up from 6.1 in July 2016). H/o 9.04 February 2014).  BP: 128/84 on 06/14/2015.  Diet includes regular soda's, white bread, ice cream, sweet tea. H/o Patient confirms he reviewed the educational mailings and states he likes breads and spaghetti and has to give up a lot of things he likes. No progress reported toward diet changes this contact call.  Patient has read the educational materials mailed on Diabetes and carbohydrate management.  Diabetes Emmi Education reviewed 07/12/2015.  -DIABETES CARE CHECKLIST -DIABETES: WHY CHECK YOUR BLOOD SUGAR? -DIABETES: WHY GET YOUR A1C CHECKED? -COUNTING CARBOHYDRATES -BP and weight logging sheet  Pain Left shoulder bump, swelling and pain.  States a 2nd MRI of the shoulder has been completed.  Next  appt with [name] 08/16/2014 for shoulder and and appt 09/2015 for leg follow-up. .  H/o past Right shoulder rotator cuff surgery went bad and patient now on Social Security Disability.  Tramadol & Hydrocodone used for pain management.  Patient states [name] requested Primary MD manage pain until surgery date.   Primary MD agreed to ordering pain management for maximum of 3 months only.  H/o Per Epic MR:  [name] advised "pt needs to f/u with surgery for the shoulder, or allow referral to pain clinic if needs med long term."  Medications:  More than 10 medications / [place] referral for medication review (completed 06/25/15)  FLU Vaccine: 06/08/15 Hep C lab work pending.  Patient is aware he needs to go by Primary MD lab for Hep C testing.   Preventives: Patient states he completed a cologuard test at home which he heard about on TV.  States results should have been mailed to his MD in December but he has not heard anything yet.   Consent: Successful outreach letter, [facility] Introductory package and consent for service form sent 06/14/2015. Patient received but has not completed and returned to [facility]. Patient states he is disorganized and has not returned.  Patient gives verbal consent to continue with [facility] services.   Plan: Screening and Initial Assessment completed 06/14/2015.  Telephonic Care Management Level 3 Program: Diabetes 06/14/2015 - 07/12/15 Program: COPD 07/12/15  COPD RN CM reviewed importance of reporting any changes in condition to MD with COPD to avoid unnecessary ED/admission utilization services. RN CM advised in COPD Action plan RN CM mailed copy of COPD Action plan magnet to patient 08/10/2015.   Care Coordination  RN CM will contact Primary MD regarding  -nebulizer option -next Primary MD appt confirmation -Hep C order in place -Cologuard results received?  Medications RN CM advised patient to go ahead and get list to MD so that MD has time to call into  pharmacy to have refill ready by next refill date.    RN CM will follow-up next contact call to confirm if patient provided MD with Medication formulary list to replace Spiriva with covered medication.   RN CM will follow-up with telephonic monthly assessment within one month and care coordination services as needs are indicted within the next 30 days.  RN CM advised to please notify MD of any changes in condition prior to scheduled appt's.  RN CM provided contact name and # [phone] or main office # [phone] and 24-hour nurse line # [phone].  RN CM confirmed patient is aware of 911 services for urgent emergency needs.  [name], RN, BSN, [facility], CCM  [facility] [employer] Management Coordinator [phone] Direct [phone] Cell [phone] Office [phone] Fax

## 2015-08-10 NOTE — Telephone Encounter (Signed)
Crystal, nurse care management with [facility] called stating pt is requesting a nebulizer.  She is requesting it to be sent to [place] [facility]. If pt needs an appointment, I will need to call her back at [phone] Please advise

## 2015-08-10 NOTE — Telephone Encounter (Signed)
[place] for OV please

## 2015-08-10 NOTE — Telephone Encounter (Signed)
Left msg on Crystal's vm to call back for appointment for pt to receive nebulizer

## 2015-08-13 ENCOUNTER — Other Ambulatory Visit: Payer: Self-pay

## 2015-08-13 NOTE — Patient Outreach (Signed)
[name]) Care Management  08/13/2015  Brad [name] [birth year] [id number]  Care Coordination Services  Contact call to patient to provide update on the following: -nebulizer option - [name] will need to see patient in office to assess for nebulizer needs.  Patient states he has appt 08/16/14 for shoulder follow-up.  States he will contact [name] for office appt once he has completed the shoulder appt.  -next Primary MD appt:  Scheduled for Tuesday, May 2 @ 2:15pm  -Hep C order in place: Yes MD has sent order to lab. Patient may go to lab to complete at any time. Patient states he has been sick with respiratory illness and will go have Hep C completed once recovered.   -Cologuard results received? Yes. Results received and in chart. MD will discuss with patient on next MD appt.   Plan: RN CM advised patient to contact RN CM if any assistance needed with coordinating any services, questions or concerns.   [name], RN, BSN, [facility], CCM  [employer] Management Coordinator [phone] Direct [phone] Cell [phone] Office [phone] Fax

## 2015-08-13 NOTE — Patient Outreach (Signed)
[facility]) Care Management  08/13/2015  Brad [name] 1955-12-28 [id number]  Care Coordination  Inbound voice message received from [name] office, Nurse [name].  States confirmed with [name] that MD will need to see patient to assess for nebulizer needs.    Plan: RN CM will contact patient with the above update.    [name], RN, BSN, [facility], CCM  [employer] Management Coordinator [phone] Direct [phone] Cell [phone] Office [phone] Fax

## 2015-08-23 DIAGNOSIS — M25512 Pain in left shoulder: Secondary | ICD-10-CM | POA: Diagnosis not present

## 2015-08-23 DIAGNOSIS — E119 Type 2 diabetes mellitus without complications: Secondary | ICD-10-CM | POA: Diagnosis not present

## 2015-09-07 ENCOUNTER — Other Ambulatory Visit: Payer: Self-pay

## 2015-09-07 DIAGNOSIS — J441 Chronic obstructive pulmonary disease with (acute) exacerbation: Secondary | ICD-10-CM

## 2015-09-07 NOTE — Patient Outreach (Addendum)
[facility]) Care Management  09/07/2015  Brad [name] 1956-05-16 [id number]   Telephonic Monthly Assessment  Referral Date: 05/31/15 Referral Source: Next Gen Tier 4 Referral Issue: Previously a Tier 3 and moved up to Tier 4  Providers: Primary MD: [name] [phone] - Last appt 06/08/15 - Tuesday, May 2 @ 2:15pm Pulmonologist: None Cardiologist:  Neurologist: [name] - next appt 08/17/15   Pharmacy [place].  Insurance: Medicare, [employer] [place] for Part D, Medicare "Extra Help"   Psycho/Social: Patient lives in his home with his girlfriend. H/o C/O difficulty remembering what he did yesterday due to h/o past strokes.   Mobility: Ambulates with no assistive devices. States decreased activity due to chronic shortness of breath with exertion and pain in his legs from past cardio/vascular issues.  Falls: none Caregiver: Girlfriend DME: CBG meter (no batteries for two months and not working), Patient denies home BP cuff (listed on med list as having; states he was never able to afford to purchase). No scales in the home.  Resources: SSD  COPD (2001-2002) Managed by Primary MD only.   Smoker: yes since age 7 yo. Smoking 2 packs per day. 2nd hand smoke: yes: Girlfriend.Marland Kitchen  H/o [facility] Pharmacist consult call completed 08/06/15 to discuss resources for smoking cessation such as the [place] Quit Line and smoking cessation classes. H/o Patient reported that he already had this information. Patient does not know if now is the time to work on quitting smoking.  Smoking Cessation Emmi Education reviewed 07/12/2015 -SMOKING CESSATION MEDICATIONS -SMOKING - TIPS ON HOW TO QUIT  Spiriva: Patient states he received a letter from his insurance provider advising as of 08/08/2015 Spiriva will no longer be on the covered formulary and MD will need to change to another covered medication. Self pay cost $265.  Medication  has been changed to substitution on medication formulary list.  Nebulizer: Patient states he would rather use a nebulizer to help break up COPD secretions. States he is using his inhalers and all other medications but thinks this would provide additional improvement or work better than what he is currently using.  Patient has been using his girlfriends nebulizer and has not scheduled appt with Primary MD to assess for nebulizer needs.   CHF Current weight 267 - no updates, no scales in the home and no new MD appt's since last contact.    Diabetes  Patient states he checks his BS every couple of days. Blood Sugars running 138 - 265.  Currently NOT checking BS due to needing new battery in CBG meter.  States has not been working in about 2 months.  A1C: 7.7 on 06/09/2015. (up from 6.1 in July 2016). H/o 9.04 February 2014).  BP: 128/84 on 06/14/2015.  Diabetes Emmi Education reviewed 07/12/2015.  -DIABETES CARE CHECKLIST -DIABETES: WHY CHECK YOUR BLOOD SUGAR? -DIABETES: WHY GET YOUR A1C CHECKED? -COUNTING CARBOHYDRATES -BP and weight logging sheet  Pain Left shoulder 2nd MRI completed and surgical intervention recommended.  Last appt with [name] 08/16/2014 for shoulder and appt 09/2015 for leg follow-up. .  H/o past Right shoulder rotator cuff surgery went bad and patient now on Social Security Disability.  Tramadol & Hydrocodone used for pain management.  Patient states [name] requested Primary MD manage pain until surgery date. Primary MD agreed to ordering pain management for maximum of 3 months only. H/o Per Epic MR: [name] advised "pt needs to f/u with surgery for the shoulder,  or allow referral to pain clinic if needs med long term." Patient states he has not contacted [name] to schedule surgery and needs his arm right now for yard work.  RN CM discussed importance of repair to avoid getting risk of a frozen shoulder.  Encouraged patient would have more yard work in the summer  than winter months and probably better to repair this time of year.  Patient agreed and stated he would consider calling MD to discuss plan.   Medications:  Patient states he has not been taking his thyroid medication but ever once in a while and does not understand the importance of needing this medication.  H/o [facility] referral for medication review (completed 06/25/15)  FLU Vaccine: 06/08/15 Hep C lab work pending.MD has sent order to lab.  Patient may go to lab to complete at any time.   Preventives: Cologuard results received at MD office.  MD will discuss on next MD appt.    Plan: Screening and Initial Assessment completed 06/14/2015.  Telephonic Care Management Level 3 - 09/07/2015 Program: Diabetes 06/14/2015 - 07/12/15 Program: COPD 07/12/15 -09/07/2015 Community RN CM Referral 09/07/2015  RN CM discussed option of [facility] Community RN CM services due to multiple non-adherence issues.  (Patient agreed to services) RN CM provided education on the importance of adherence to all recommendations.  -advised patient should be checking BS as MD has ordered.  -advised in importance of weight and BP monitoring.  -advised patient in need for scales and home BP cuff.  -encouraged compliance with scheduling appt to see MD for Nebulizer evaluation.  RN CM offered to schedule this appt but patient stated he could call and schedule.   -encouraged Hep C lab order completion -encouraged to contact Dr. Phoebe Sharps office to discuss shoulder surgery plan.  -Emmi education:  Thyroid management mailed 09/07/2015  RN CM will follow-up with telephonic monthly assessment within one month and care coordination services as needs are indicted within the next 30 days.  RN CM advised to please notify MD of any changes in condition prior to scheduled appt's.  RN CM provided contact name and # [phone] or main office # [phone] and 24-hour nurse line # [phone].  RN CM confirmed patient is aware  of 911 services for urgent emergency needs.  [name], RN, BSN, [facility], CCM  [employer] Management Coordinator [phone] Direct [phone] Cell [phone] Office [phone] Fax

## 2015-09-14 ENCOUNTER — Other Ambulatory Visit (INDEPENDENT_AMBULATORY_CARE_PROVIDER_SITE_OTHER): Payer: Medicare Other

## 2015-09-14 ENCOUNTER — Ambulatory Visit (INDEPENDENT_AMBULATORY_CARE_PROVIDER_SITE_OTHER): Payer: Medicare Other | Admitting: Internal Medicine

## 2015-09-14 ENCOUNTER — Encounter: Payer: Self-pay | Admitting: Internal Medicine

## 2015-09-14 ENCOUNTER — Other Ambulatory Visit: Payer: Self-pay | Admitting: *Deleted

## 2015-09-14 ENCOUNTER — Encounter: Payer: Self-pay | Admitting: Surgery

## 2015-09-14 ENCOUNTER — Other Ambulatory Visit: Payer: Self-pay | Admitting: Internal Medicine

## 2015-09-14 VITALS — BP 138/86 | HR 79 | Temp 98.4°F | Resp 20 | Wt 273.0 lb

## 2015-09-14 DIAGNOSIS — J438 Other emphysema: Secondary | ICD-10-CM

## 2015-09-14 DIAGNOSIS — Z0000 Encounter for general adult medical examination without abnormal findings: Secondary | ICD-10-CM

## 2015-09-14 DIAGNOSIS — L989 Disorder of the skin and subcutaneous tissue, unspecified: Secondary | ICD-10-CM | POA: Diagnosis not present

## 2015-09-14 DIAGNOSIS — I10 Essential (primary) hypertension: Secondary | ICD-10-CM | POA: Diagnosis not present

## 2015-09-14 DIAGNOSIS — E039 Hypothyroidism, unspecified: Secondary | ICD-10-CM

## 2015-09-14 DIAGNOSIS — T148 Other injury of unspecified body region: Secondary | ICD-10-CM | POA: Diagnosis not present

## 2015-09-14 DIAGNOSIS — W57XXXA Bitten or stung by nonvenomous insect and other nonvenomous arthropods, initial encounter: Secondary | ICD-10-CM

## 2015-09-14 DIAGNOSIS — L089 Local infection of the skin and subcutaneous tissue, unspecified: Secondary | ICD-10-CM

## 2015-09-14 DIAGNOSIS — Z0189 Encounter for other specified special examinations: Secondary | ICD-10-CM | POA: Diagnosis not present

## 2015-09-14 LAB — T4, FREE: Free T4: 0.71 ng/dL (ref 0.60–1.60)

## 2015-09-14 MED ORDER — NEBULIZER DEVI: Status: DC

## 2015-09-14 MED ORDER — DOXYCYCLINE HYCLATE 100 MG PO TABS: 100.0000 mg | ORAL_TABLET | Freq: Two times a day (BID) | ORAL | Status: DC

## 2015-09-14 MED ORDER — IPRATROPIUM-ALBUTEROL 0.5-2.5 (3) MG/3ML IN SOLN: 3.0000 mL | Freq: Four times a day (QID) | RESPIRATORY_TRACT | Status: DC | PRN

## 2015-09-14 NOTE — Assessment & Plan Note (Signed)
Overall stable, stable overall by history and exam, recent data reviewed with pt, and pt to continue medical treatment as before,  to f/u any worsening symptoms or concerns except to add duoneb qid prn,  to f/u any worsening symptoms or concerns

## 2015-09-14 NOTE — Assessment & Plan Note (Signed)
Chronic persistent x months left face - for derm referral

## 2015-09-14 NOTE — Patient Instructions (Signed)
Please take all new medication as prescribed - the antibiotic, and the nebulizer medication (with the prescription for the machine also sent to the pharmacy)  You will be contacted regarding the referral for: Dermatology  Please continue all other medications as before, and refills have been done if requested.  Please have the pharmacy call with any other refills you may need.  Please continue your efforts at being more active, low cholesterol diet, and weight control.  Please keep your appointments with your specialists as you may have planned

## 2015-09-14 NOTE — Assessment & Plan Note (Signed)
stable overall by history and exam, recent data reviewed with pt, and pt to continue medical treatment as before,  to f/u any worsening symptoms or concerns BP Readings from Last 3 Encounters:  09/14/15 138/86  06/14/15 128/84  06/08/15 128/84

## 2015-09-14 NOTE — Progress Notes (Signed)
Pre visit review using our clinic review tool, if applicable. No additional management support is needed unless otherwise documented below in the visit note. 

## 2015-09-14 NOTE — Assessment & Plan Note (Signed)
Mild to mod, for antibx course,  to f/u any worsening symptoms or concerns 

## 2015-09-14 NOTE — Progress Notes (Signed)
Subjective:    Patient ID: Brad [name], male    DOB: May 13, 1956, 60 y.o.   MRN: [id number]  HPI    Here at the urging of home nurse to consier neb tx as inhales now very expensive with spiriva almost $300, and girlfriends duoneb helps quite a bit.  Pt denies chest pain, increased sob or doe over baseline, wheezing, orthopnea, PND, increased LE swelling, palpitations, dizziness or syncope. Pt denies new neurological symptoms such as new headache, or facial or extremity weakness or numbness  Pt denies new neurological symptoms such as new headache, or facial or extremity weakness or numbness   Pt denies polydipsia, polyuria.  Also mentions sister has lung cancer stage B. Also with 2 nonhealing lesions left lateral face at the angle of the jaw.  Also with 2 insect bites areas with tender to left angle of jaw and right submental area, also has a sore to the left nares tender.  Putting off the left shoudler rot cuff tear surgury, may have this done next yr.  Labs pending after drawn today - hep c and thyroid Past Medical History  Diagnosis Date  . Depression   . GERD (gastroesophageal reflux disease)   . Hyperlipidemia 08/27/2014    takes Simvastatin daily  . Hypertension     takes Amlodipine and Lisinopril daily  . Hypothyroidism 08/27/2014    takes Synthroid daily  . Anemia     takes Ferrous Sulfated daily  . Carotid stenosis 08/27/2014    takes Aspirin daily  . Diabetes mellitus type II     takes Metformin and Glipizide daily  . Muscle spasm     takes Zanaflex daily as needed  . Insomnia     takes Pamelor nightly  . COPD (chronic obstructive pulmonary disease) (HCC)     Albuterol daily as needed and Spiriva daily  . MVP (mitral valve prolapse)   . History of blood clots     in right leg;being followed by Dr.Brabham for this  . Shortness of breath dyspnea   . Peripheral neuropathy ([place])   . Headache     occasionally  . Dizziness   . Stroke ([place])   . Arthritis   . Joint pain     hips  . Peripheral edema     takes Furosemide daily as needed  . History of hiatal hernia   . History of gastric ulcer   . Urinary frequency   . Urinary urgency   . History of blood transfusion 09/2014    2 units-no abnormal reaction noted   Past Surgical History  Procedure Laterality Date  . Neg stress test  2000  . Rotator cuff repair Right 2009    multiple f/u Sxs/infection Tx  . Incision and drainage perirectal abscess  03/2009  . Esophagogastroduodenoscopy N/A 09/24/2014    Procedure: ESOPHAGOGASTRODUODENOSCOPY (EGD);  Surgeon: Winfield Cunas., MD;  Location: [facility] ENDOSCOPY;  Service: Endoscopy;  Laterality: N/A;  . I&d of right arm    . Endarterectomy Left 11/13/2014    Procedure: ENDARTERECTOMY CAROTID WITH PATCH ANGIOPLASTY;  Surgeon: Serafina Mitchell, MD;  Location: [place];  Service: Vascular;  Laterality: Left;    reports that he has been smoking Cigarettes.  He has a 96 pack-year smoking history. He has never used smokeless tobacco. He reports that he drinks alcohol. He reports that he does not use illicit drugs. family history includes Alcohol abuse in his father; Diabetes type II in his mother; Heart disease in  his mother; Lung cancer in his father, maternal grandmother, and sister. Allergies  Allergen Reactions  . Doxycycline Hives and Itching  . Lipitor [Atorvastatin] Other (See Comments)    Myalgia   . Oxycodone Itching   Current Outpatient Prescriptions on File Prior to Visit  Medication Sig Dispense Refill  . amLODipine (NORVASC) 5 MG tablet Take 1 tablet (5 mg total) by mouth daily. 90 tablet 3  . aspirin EC 325 MG tablet Take 1 tablet (325 mg total) by mouth daily. 30 tablet 0  . Blood Pressure Monitor DEVI Use to check Blood pressure daily 1 Device 0  . ferrous sulfate 325 (65 FE) MG tablet Take 1 tablet (325 mg total) by mouth 3 (three) times daily with meals. 30 tablet 3  . furosemide (LASIX) 40 MG tablet 1 tab by mouth in the AM, with a second dose in PM as  needed only for swelling (Patient taking differently: Take 40 mg by mouth daily. Can take second dose in evening as needed for swelling) 60 tablet 11  . glucosamine-chondroitin 500-400 MG tablet Take 2 tablets by mouth daily. Reported on 09/07/2015    . glucose blood test strip 1 each by Other route See admin instructions. Reported on 09/07/2015    . HYDROcodone-acetaminophen (NORCO) 7.5-325 MG tablet Take 1 tablet by mouth every 6 (six) hours as needed for moderate pain. 120 tablet 0  . Lancets MISC 1 each by Other route See admin instructions. Reported on 09/07/2015    . levothyroxine (SYNTHROID, LEVOTHROID) 112 MCG tablet Take 1 tablet (112 mcg total) by mouth daily. 90 tablet 3  . lisinopril (PRINIVIL,ZESTRIL) 20 MG tablet Take 1 tablet (20 mg total) by mouth daily. 90 tablet 3  . metFORMIN (GLUCOPHAGE-XR) 500 MG 24 hr tablet 3 tabs by mouth in the AM 270 tablet 3  . ondansetron (ZOFRAN-ODT) 8 MG disintegrating tablet Take 1 tablet (8 mg total) by mouth every 8 (eight) hours as needed for nausea or vomiting. 20 tablet 0  . PROAIR HFA 108 (90 BASE) MCG/ACT inhaler INHALE 2 PUFFS BY MOUTH EVERY 6 HOURS AS NEEDED FOR WHEEZING OR SHORTNESS OF BREATH 8.5 g 11  . simvastatin (ZOCOR) 40 MG tablet Take 1 tablet (40 mg total) by mouth at bedtime. 90 tablet 3  . SPIRIVA HANDIHALER 18 MCG inhalation capsule INHALE 1 CAPSULE VIA HANDIHALER EVERY DAY 30 capsule 11  . tiotropium (SPIRIVA) 18 MCG inhalation capsule Place 18 mcg into inhaler and inhale 2 (two) times daily as needed (for wheezing). Reported on 09/07/2015    . tiZANidine (ZANAFLEX) 4 MG tablet Take 8 mg by mouth every 6 (six) hours as needed for muscle spasms.    Marland Kitchen tiZANidine (ZANAFLEX) 4 MG tablet TAKE 1 TABLET BY MOUTH EVERY 6 HOURS AS NEEDED FOR MUSCLE SPASMS 60 tablet 1  . traMADol (ULTRAM) 50 MG tablet TAKE 1-2 TABLETS BY MOUTH UP TO FOUR TIMES DAILY 120 tablet 5   No current facility-administered medications on file prior to visit.    Review  of Systems  Constitutional: Negative for unusual diaphoresis or night sweats HENT: Negative for ringing in ear or discharge Eyes: Negative for double vision or worsening visual disturbance.  Respiratory: Negative for choking and stridor.   Gastrointestinal: Negative for vomiting or other signifcant bowel change Genitourinary: Negative for hematuria or change in urine volume.  Musculoskeletal: Negative for other MSK pain or swelling Skin: Negative for color change and worsening wound.  Neurological: Negative for tremors and numbness other than  noted  Psychiatric/Behavioral: Negative for decreased concentration or agitation other than above       Objective:   Physical Exam BP 138/86 mmHg  Pulse 79  Temp(Src) 98.4 F (36.9 C) (Oral)  Resp 20  Wt 273 lb (123.832 kg)  SpO2 95% Ambulatory o2 sat - 94% VS noted,  Constitutional: Pt appears in no significant distress HENT: Head: NCAT.  Right Ear: External ear normal.  Left Ear: External ear normal.  Eyes: . Pupils are equal, round, and reactive to light. Conjunctivae and EOM are normal Neck: Normal range of motion. Neck supple.  Cardiovascular: Normal rate and regular rhythm.   Pulmonary/Chest: Effort normal and breath sounds decreased bilat without rales or wheezing.  Abd:  Soft, NT, ND, + BS Neurological: Pt is alert. Not confused , motor grossly intact Skin: Skin is warm. No rash, no LE edema; left face at angle of jaw and right submental with raised tender eythem area with slight d/c but no fluctuance; also left face with 2 lesions nontender nonhealing with central areas ? Necrosis - ? Basal cells -  Psychiatric: Pt behavior is normal. No agitation.     Assessment & Plan:

## 2015-09-14 NOTE — Patient Outreach (Signed)
Referral received from telephonic care manager for involvement with [facility] care management services and home assessment.  According to chart, member has history of diabetes, hypertension, COPD, and hyperlipidemia.  Call placed to member to initiate engagement.  This care manager introduced self and purpose of call.  Member reports that he is open to a home visit, which is scheduled for next week.  This care manager inquired about any urgent concerns, member denies and reports that he would rather discuss during visit.  Contact information provided, encouraged to contact with any questions.  [name], BSN, [place] Management  [facility] Care Manager [phone]

## 2015-09-15 LAB — HEPATITIS C ANTIBODY: HCV Ab: REACTIVE — AB

## 2015-09-16 ENCOUNTER — Other Ambulatory Visit: Payer: Self-pay

## 2015-09-16 LAB — HEPATITIS C RNA QUANTITATIVE
HCV Quantitative Log: 6.23 {Log} — ABNORMAL HIGH (ref <1.18)
HCV Quantitative: 1701236 IU/mL — ABNORMAL HIGH (ref <15)

## 2015-09-16 NOTE — Patient Outreach (Signed)
[facility]) Care Management  09/16/2015  Brad [name] 19-Jun-1956 [id number]   Voicemail message received from patient stating he received and order for a nebulizer from his MD but does not know where to take the order.  States he tried to take the order to [facility] but they were not able to assist.   H/o insurance with Medicare and Medicaid.   Plan:   RN CM notified assigned [facility] Community RN CM of update.    [name], RN, BSN, [facility], CCM  [employer] Management Coordinator [phone] Direct [phone] Cell [phone] Office [phone] Fax

## 2015-09-17 ENCOUNTER — Encounter: Payer: Self-pay | Admitting: Internal Medicine

## 2015-09-17 ENCOUNTER — Other Ambulatory Visit: Payer: Self-pay | Admitting: Internal Medicine

## 2015-09-17 ENCOUNTER — Other Ambulatory Visit: Payer: Self-pay | Admitting: *Deleted

## 2015-09-17 DIAGNOSIS — B182 Chronic viral hepatitis C: Secondary | ICD-10-CM

## 2015-09-17 DIAGNOSIS — R768 Other specified abnormal immunological findings in serum: Secondary | ICD-10-CM

## 2015-09-17 HISTORY — DX: Chronic viral hepatitis C: B18.2

## 2015-09-17 NOTE — Patient Outreach (Signed)
Notified that member had contacted the 24 hour nurse line and left a voice message for the telephonic care manager, C. Hutchinson, with questions about how to obtain his nebulizer.  He has the order/prescription for it, but was confused as to where to take it.  Call placed to member to discuss obtaining the machine.  Different local agencies discussed, member requests to wait until home visit on Tuesday to discuss further, stating "I got things going on right now, I'll just wait."  Initial home visit confirmed for Tuesday afternoon, 2/14.  Brad [name], BSN, [place] Management  [facility] Care Manager [phone]

## 2015-09-20 ENCOUNTER — Ambulatory Visit: Payer: Medicare Other | Admitting: Surgery

## 2015-09-20 ENCOUNTER — Encounter (HOSPITAL_COMMUNITY): Payer: Medicare Other

## 2015-09-21 ENCOUNTER — Encounter: Payer: Self-pay | Admitting: *Deleted

## 2015-09-21 ENCOUNTER — Other Ambulatory Visit: Payer: Self-pay | Admitting: *Deleted

## 2015-09-21 ENCOUNTER — Other Ambulatory Visit: Payer: Self-pay | Admitting: Internal Medicine

## 2015-09-21 VITALS — BP 160/88 | HR 77 | Resp 18 | Ht 71.0 in | Wt 273.0 lb

## 2015-09-21 DIAGNOSIS — J441 Chronic obstructive pulmonary disease with (acute) exacerbation: Secondary | ICD-10-CM

## 2015-09-21 DIAGNOSIS — I10 Essential (primary) hypertension: Secondary | ICD-10-CM

## 2015-09-21 DIAGNOSIS — E1169 Type 2 diabetes mellitus with other specified complication: Secondary | ICD-10-CM

## 2015-09-21 NOTE — Patient Outreach (Signed)
Brad [facility]) Care Management   09/21/2015  Brad [name] [birth year] [id number]  Brad [name] is an 60 y.o. male  Subjective:   Member states that he is "doing fine, I just need to get this breathing machine and stop smoking.  I know I'd feel a whole lot better."  He denies any shortness of breath currently, but states that he does at times need to stop and rest during activity (such as working out in the yard).  He reports that he need surgery on his left shoulder, but states "I'm not doing that right now."  Denies any chest pain or discomfort.  Objective:   Review of Systems  Constitutional: Negative.   HENT: Negative.   Eyes: Negative.   Respiratory: Positive for shortness of breath.        With extended activity   Cardiovascular: Positive for leg swelling.  Gastrointestinal: Negative.   Genitourinary: Negative.   Musculoskeletal: Positive for joint pain.  Skin: Negative.   Neurological: Negative.   Endo/Heme/Allergies: Negative.   Psychiatric/Behavioral: Negative.     Physical Exam  Constitutional: He is oriented to person, place, and time. He appears well-developed and well-nourished.  Neck: Normal range of motion.  Cardiovascular: Normal rate, regular rhythm and normal heart sounds.   Respiratory: Effort normal and breath sounds normal.  GI: Soft. Bowel sounds are normal.  Musculoskeletal: Normal range of motion.  Neurological: He is alert and oriented to person, place, and time.  Skin: Skin is warm and dry.    BP 160/88 mmHg  Pulse 77  Resp 18  Ht 1.803 m (_0 )  Wt 273 lb (123.832 kg)  BMI 38.09 kg/m2  SpO2 98%   Current Medications:   Current Outpatient Prescriptions  Medication Sig Dispense Refill  . amLODipine (NORVASC) 5 MG tablet Take 1 tablet (5 mg total) by mouth daily. 90 tablet 3  . doxycycline (VIBRA-TABS) 100 MG tablet Take 1 tablet (100 mg total) by mouth 2 (two) times daily. 20 tablet 0  . ferrous sulfate 325 (65  FE) MG tablet Take 1 tablet (325 mg total) by mouth 3 (three) times daily with meals. 30 tablet 3  . furosemide (LASIX) 40 MG tablet 1 tab by mouth in the AM, with a second dose in PM as needed only for swelling (Patient taking differently: Take 40 mg by mouth daily. Can take second dose in evening as needed for swelling) 60 tablet 11  . glucose blood test strip 1 each by Other route See admin instructions. Reported on 09/07/2015    . Lancets MISC 1 each by Other route See admin instructions. Reported on 09/07/2015    . levothyroxine (SYNTHROID, LEVOTHROID) 112 MCG tablet Take 1 tablet (112 mcg total) by mouth daily. 90 tablet 3  . metFORMIN (GLUCOPHAGE-XR) 500 MG 24 hr tablet 3 tabs by mouth in the AM 270 tablet 3  . PROAIR HFA 108 (90 BASE) MCG/ACT inhaler INHALE 2 PUFFS BY MOUTH EVERY 6 HOURS AS NEEDED FOR WHEEZING OR SHORTNESS OF BREATH 8.5 g 11  . aspirin EC 325 MG tablet Take 1 tablet (325 mg total) by mouth daily. (Patient not taking: Reported on 09/21/2015) 30 tablet 0  . Blood Pressure Monitor DEVI Use to check Blood pressure daily (Patient not taking: Reported on 09/21/2015) 1 Device 0  . glucosamine-chondroitin 500-400 MG tablet Take 2 tablets by mouth daily. Reported on 09/21/2015    . HYDROcodone-acetaminophen (NORCO) 7.5-325 MG tablet Take 1 tablet by mouth every 6 (  six) hours as needed for moderate pain. (Patient not taking: Reported on 09/21/2015) 120 tablet 0  . ipratropium-albuterol (DUONEB) 0.5-2.5 (3) MG/3ML SOLN USE 3 ML VIA NEBULIZER EVERY 6 HOURS AS NEEDED (Patient not taking: Reported on 09/21/2015) 3240 mL 3  . lisinopril (PRINIVIL,ZESTRIL) 20 MG tablet Take 1 tablet (20 mg total) by mouth daily. (Patient not taking: Reported on 09/21/2015) 90 tablet 3  . ondansetron (ZOFRAN-ODT) 8 MG disintegrating tablet Take 1 tablet (8 mg total) by mouth every 8 (eight) hours as needed for nausea or vomiting. 20 tablet 0  . Respiratory Therapy Supplies (NEBULIZER) DEVI Use as directed four times  daily 1 each 0  . simvastatin (ZOCOR) 40 MG tablet Take 1 tablet (40 mg total) by mouth at bedtime. (Patient not taking: Reported on 09/21/2015) 90 tablet 3  . SPIRIVA HANDIHALER 18 MCG inhalation capsule INHALE 1 CAPSULE VIA HANDIHALER EVERY DAY (Patient not taking: Reported on 09/21/2015) 30 capsule 11  . tiotropium (SPIRIVA) 18 MCG inhalation capsule Place 18 mcg into inhaler and inhale 2 (two) times daily as needed (for wheezing). Reported on 09/21/2015    . tiZANidine (ZANAFLEX) 4 MG tablet Take 8 mg by mouth every 6 (six) hours as needed for muscle spasms.    Marland Kitchen tiZANidine (ZANAFLEX) 4 MG tablet TAKE 1 TABLET BY MOUTH EVERY 6 HOURS AS NEEDED FOR MUSCLE SPASMS 60 tablet 1  . traMADol (ULTRAM) 50 MG tablet TAKE 1-2 TABLETS BY MOUTH UP TO FOUR TIMES DAILY 120 tablet 5   No current facility-administered medications for this visit.    Functional Status:   In your present state of health, do you have any difficulty performing the following activities: 09/21/2015 06/14/2015  Hearing? Y N  Vision? Y N  Difficulty concentrating or making decisions? N Y  Walking or climbing stairs? Y Y  Dressing or bathing? N Y  Doing errands, shopping? N Y  Conservation officer, nature and eating ? N Y  Using the Toilet? N N  In the past six months, have you accidently leaked urine? N N  Do you have problems with loss of bowel control? N N  Managing your Medications? N N  Managing your Finances? N N  Housekeeping or managing your Housekeeping? Aggie Moats    Fall/Depression Screening:    PHQ 2/9 Scores 09/21/2015 06/14/2015  PHQ - 2 Score 0 0   Fall Risk  09/07/2015 06/14/2015  Falls in the past year? No No     Assessment:    Met with member for initial home visit.  [facility] care management services again explained.  Member states that he has received a [facility] packet, but have not signed a consent and mailed it back.  Consent signed.  Member expresses his biggest concern as needing to obtain his nebulizer machine.  He states that he  has been using his fiance's nebulizer as needed and reports that he has relief when using it.  He reports being previously on Spiriva, but that it is no longer covered by his insurance.  He is made aware that this care manager will consult pharmacy to review.    He has history of diabetes, hypertension, and leg swelling.  He has not been taking his blood sugar due to not having a battery for his meter (he is advised to obtain).  He does not take his blood pressure due to not having a monitor [facility] will provide one on the next home visit).  Member reports taking his "fluid pills" only when he  needs it, when his legs are swelling.  Medications reviewed, prescription for Furosemide states that he should be taking one pill daily and a second one as needed.  He states that he was not aware that he should be taking it every day, and will start today as prescribed.  Discussed daily weight as a method to monitor fluid status, but he reports not having a working scale. [facility] will provide one for him at the next home visit.  Provided with [facility] calendar tool for documentation of blood sugar, blood pressure and weights.  Member voice no other needs at this time, encouraged to contact this care manager with questions or concerns.  Contact information provided.  Plan:   Routine home visit scheduled for next month.  [facility] CM Care Plan Problem One        Most Recent Value   Care Plan Problem One  Knowledge deficit regarding COPD self care management   Role Documenting the Problem One  Care Management Coordinator   Care Plan for Problem One  Active   [facility] Long Term Goal (31-90 days)  Member will be able to report 3 ways to better manage COPD within the next 31 days   [facility] Long Term Goal Start Date  09/21/15   Interventions for Problem One Long Term Goal  Discussed the effect smoking has on COPD and the benefits of exercise, the importance of using inhaler & nebulizer, pharmacy referral complete to investigate alternative  for Spiriva   [facility] CM Short Term Goal #1 (0-30 days)  Member will have nebulizer machine within the next 4 weeks   [facility] CM Short Term Goal #1 Start Date  09/21/15   Interventions for Short Term Goal #1  Discussed the effect smoking has on COPD and the benefits of exercise, the importance of using inhaler & nebulizer, pharmacy referral complete to investigate alternative for Spiriva.  Provided with a list of medical supply companies in [place] to obtain machine.   [facility] CM Short Term Goal #2 (0-30 days)  Member will report a decrease in smoking within the next 4 weeks   [facility] CM Short Term Goal #2 Start Date  09/21/15   Interventions for Short Term Goal #2  Discussed the effect smoking has on COPD and the benefits of exercise, the importance of using inhaler & nebulizer, pharmacy referral complete to investigate alternative for Spiriva   [facility] CM Short Term Goal #3 (0-30 days)  Member will report increase in exercise within the next 4 weeks   [facility] CM Short Term Goal #3 Start Date  09/21/15   Interventions for Short Tern Goal #3  Discussed the effect smoking has on COPD and the benefits of exercise, the importance of using inhaler & nebulizer, pharmacy referral complete to investigate alternative for Spiriva    [facility] CM Care Plan Problem Two        Most Recent Value   Care Plan Problem Two  Medication management   Role Documenting the Problem Two  Care Management [place] for Problem Two  Active   [facility] CM Short Term Goal #1 (0-30 days)  Member will have all medications from pharmacy within the next 2 weeks (Lisinopril and Simvistatin)   [facility] CM Short Term Goal #1 Start Date  09/21/15   Interventions for Short Term Goal #2   Call placed to pharmacy to refill missing medications (Lisinopril & Simvistatin)   [facility] CM Short Term Goal #2 (0-30 days)  Member will reports taking  all medications as prescribed within the next 4 weeks   [facility] CM Short Term Goal #2 Start Date  09/21/15   Interventions for  Short Term Goal #2  Discussed importance of taking all meds as prescribed in effort to manage all conditions (COPD, Diabetes, Hypertension).  Provided with pill box, explanation provided for use of all meds, re-educated on correct administration of all meds      [name], BSN, [place] Manager [phone]

## 2015-09-24 ENCOUNTER — Encounter: Payer: Self-pay | Admitting: *Deleted

## 2015-09-27 ENCOUNTER — Observation Stay (HOSPITAL_COMMUNITY): Payer: Medicare Other

## 2015-09-27 ENCOUNTER — Emergency Department (HOSPITAL_COMMUNITY): Payer: Medicare Other

## 2015-09-27 ENCOUNTER — Inpatient Hospital Stay (HOSPITAL_COMMUNITY)
Admission: EM | Admit: 2015-09-27 | Discharge: 2015-09-29 | DRG: 065 | Discharge status: Against Medical Advice | Payer: Medicare Other | Attending: Internal Medicine | Admitting: Internal Medicine

## 2015-09-27 ENCOUNTER — Telehealth: Payer: Self-pay | Admitting: Neurology

## 2015-09-27 ENCOUNTER — Encounter (HOSPITAL_COMMUNITY): Payer: Self-pay | Admitting: Family Medicine

## 2015-09-27 DIAGNOSIS — I69354 Hemiplegia and hemiparesis following cerebral infarction affecting left non-dominant side: Secondary | ICD-10-CM

## 2015-09-27 DIAGNOSIS — I10 Essential (primary) hypertension: Secondary | ICD-10-CM | POA: Diagnosis not present

## 2015-09-27 DIAGNOSIS — Z7984 Long term (current) use of oral hypoglycemic drugs: Secondary | ICD-10-CM

## 2015-09-27 DIAGNOSIS — I639 Cerebral infarction, unspecified: Secondary | ICD-10-CM

## 2015-09-27 DIAGNOSIS — R269 Unspecified abnormalities of gait and mobility: Secondary | ICD-10-CM | POA: Diagnosis present

## 2015-09-27 DIAGNOSIS — J449 Chronic obstructive pulmonary disease, unspecified: Secondary | ICD-10-CM | POA: Diagnosis not present

## 2015-09-27 DIAGNOSIS — D649 Anemia, unspecified: Secondary | ICD-10-CM | POA: Diagnosis present

## 2015-09-27 DIAGNOSIS — I6523 Occlusion and stenosis of bilateral carotid arteries: Secondary | ICD-10-CM | POA: Diagnosis present

## 2015-09-27 DIAGNOSIS — F329 Major depressive disorder, single episode, unspecified: Secondary | ICD-10-CM | POA: Diagnosis present

## 2015-09-27 DIAGNOSIS — I63231 Cerebral infarction due to unspecified occlusion or stenosis of right carotid arteries: Secondary | ICD-10-CM | POA: Diagnosis not present

## 2015-09-27 DIAGNOSIS — R270 Ataxia, unspecified: Secondary | ICD-10-CM | POA: Diagnosis not present

## 2015-09-27 DIAGNOSIS — Z79899 Other long term (current) drug therapy: Secondary | ICD-10-CM

## 2015-09-27 DIAGNOSIS — R29810 Facial weakness: Secondary | ICD-10-CM | POA: Diagnosis present

## 2015-09-27 DIAGNOSIS — R531 Weakness: Secondary | ICD-10-CM

## 2015-09-27 DIAGNOSIS — R200 Anesthesia of skin: Secondary | ICD-10-CM

## 2015-09-27 DIAGNOSIS — E119 Type 2 diabetes mellitus without complications: Secondary | ICD-10-CM | POA: Diagnosis present

## 2015-09-27 DIAGNOSIS — G4700 Insomnia, unspecified: Secondary | ICD-10-CM | POA: Diagnosis present

## 2015-09-27 DIAGNOSIS — E1159 Type 2 diabetes mellitus with other circulatory complications: Secondary | ICD-10-CM

## 2015-09-27 DIAGNOSIS — I119 Hypertensive heart disease without heart failure: Secondary | ICD-10-CM | POA: Diagnosis present

## 2015-09-27 DIAGNOSIS — R42 Dizziness and giddiness: Secondary | ICD-10-CM | POA: Diagnosis not present

## 2015-09-27 DIAGNOSIS — E039 Hypothyroidism, unspecified: Secondary | ICD-10-CM | POA: Diagnosis present

## 2015-09-27 DIAGNOSIS — E1140 Type 2 diabetes mellitus with diabetic neuropathy, unspecified: Secondary | ICD-10-CM | POA: Diagnosis not present

## 2015-09-27 DIAGNOSIS — E785 Hyperlipidemia, unspecified: Secondary | ICD-10-CM | POA: Diagnosis not present

## 2015-09-27 DIAGNOSIS — Z6836 Body mass index (BMI) 36.0-36.9, adult: Secondary | ICD-10-CM

## 2015-09-27 DIAGNOSIS — Z7982 Long term (current) use of aspirin: Secondary | ICD-10-CM

## 2015-09-27 DIAGNOSIS — I4510 Unspecified right bundle-branch block: Secondary | ICD-10-CM | POA: Diagnosis present

## 2015-09-27 DIAGNOSIS — K219 Gastro-esophageal reflux disease without esophagitis: Secondary | ICD-10-CM | POA: Diagnosis present

## 2015-09-27 DIAGNOSIS — F17210 Nicotine dependence, cigarettes, uncomplicated: Secondary | ICD-10-CM | POA: Diagnosis present

## 2015-09-27 DIAGNOSIS — I63233 Cerebral infarction due to unspecified occlusion or stenosis of bilateral carotid arteries: Secondary | ICD-10-CM | POA: Diagnosis not present

## 2015-09-27 DIAGNOSIS — B182 Chronic viral hepatitis C: Secondary | ICD-10-CM | POA: Diagnosis present

## 2015-09-27 DIAGNOSIS — E669 Obesity, unspecified: Secondary | ICD-10-CM | POA: Diagnosis present

## 2015-09-27 DIAGNOSIS — H538 Other visual disturbances: Secondary | ICD-10-CM | POA: Diagnosis present

## 2015-09-27 LAB — DIFFERENTIAL
Basophils Absolute: 0 10*3/uL (ref 0.0–0.1)
Basophils Relative: 1 %
Eosinophils Absolute: 0.1 10*3/uL (ref 0.0–0.7)
Eosinophils Relative: 2 %
Lymphocytes Relative: 26 %
Lymphs Abs: 1.3 10*3/uL (ref 0.7–4.0)
Monocytes Absolute: 0.7 10*3/uL (ref 0.1–1.0)
Monocytes Relative: 14 %
Neutro Abs: 2.9 10*3/uL (ref 1.7–7.7)
Neutrophils Relative %: 57 %

## 2015-09-27 LAB — I-STAT CHEM 8, ED
BUN: 13 mg/dL (ref 6–20)
Calcium, Ion: 1.04 mmol/L — ABNORMAL LOW (ref 1.12–1.23)
Chloride: 98 mmol/L — ABNORMAL LOW (ref 101–111)
Creatinine, Ser: 1.2 mg/dL (ref 0.61–1.24)
Glucose, Bld: 196 mg/dL — ABNORMAL HIGH (ref 65–99)
HCT: 41 % (ref 39.0–52.0)
Hemoglobin: 13.9 g/dL (ref 13.0–17.0)
Potassium: 4.1 mmol/L (ref 3.5–5.1)
Sodium: 139 mmol/L (ref 135–145)
TCO2: 27 mmol/L (ref 0–100)

## 2015-09-27 LAB — COMPREHENSIVE METABOLIC PANEL
ALT: 30 U/L (ref 17–63)
AST: 43 U/L — ABNORMAL HIGH (ref 15–41)
Albumin: 2.1 g/dL — ABNORMAL LOW (ref 3.5–5.0)
Alkaline Phosphatase: 131 U/L — ABNORMAL HIGH (ref 38–126)
Anion gap: 11 (ref 5–15)
BUN: 12 mg/dL (ref 6–20)
CO2: 25 mmol/L (ref 22–32)
Calcium: 8.5 mg/dL — ABNORMAL LOW (ref 8.9–10.3)
Chloride: 102 mmol/L (ref 101–111)
Creatinine, Ser: 1.16 mg/dL (ref 0.61–1.24)
GFR calc Af Amer: >60 mL/min (ref >60)
GFR calc non Af Amer: >60 mL/min (ref >60)
Glucose, Bld: 196 mg/dL — ABNORMAL HIGH (ref 65–99)
Potassium: 4.1 mmol/L (ref 3.5–5.1)
Sodium: 138 mmol/L (ref 135–145)
Total Bilirubin: 0.3 mg/dL (ref 0.3–1.2)
Total Protein: 6 g/dL — ABNORMAL LOW (ref 6.5–8.1)

## 2015-09-27 LAB — CBC
HCT: 39.8 % (ref 39.0–52.0)
Hemoglobin: 13 g/dL (ref 13.0–17.0)
MCH: 26.3 pg (ref 26.0–34.0)
MCHC: 32.7 g/dL (ref 30.0–36.0)
MCV: 80.6 fL (ref 78.0–100.0)
Platelets: 191 10*3/uL (ref 150–400)
RBC: 4.94 MIL/uL (ref 4.22–5.81)
RDW: 14.4 % (ref 11.5–15.5)
WBC: 5 10*3/uL (ref 4.0–10.5)

## 2015-09-27 LAB — GLUCOSE, CAPILLARY: Glucose-Capillary: 211 mg/dL — ABNORMAL HIGH (ref 65–99)

## 2015-09-27 LAB — APTT: aPTT: 30 seconds (ref 24–37)

## 2015-09-27 LAB — PROTIME-INR
INR: 1.11 (ref 0.00–1.49)
Prothrombin Time: 14.5 seconds (ref 11.6–15.2)

## 2015-09-27 LAB — TSH: TSH: 6.651 u[IU]/mL — ABNORMAL HIGH (ref 0.350–4.500)

## 2015-09-27 LAB — CBG MONITORING, ED: Glucose-Capillary: 177 mg/dL — ABNORMAL HIGH (ref 65–99)

## 2015-09-27 LAB — I-STAT TROPONIN, ED: Troponin i, poc: 0.02 ng/mL (ref 0.00–0.08)

## 2015-09-27 MED ORDER — ONDANSETRON 4 MG PO TBDP: 8.0000 mg | ORAL_TABLET | Freq: Three times a day (TID) | ORAL | Status: DC | PRN

## 2015-09-27 MED ORDER — HEPARIN SODIUM (PORCINE) 5000 UNIT/ML IJ SOLN
5000.0000 [IU] | Freq: Three times a day (TID) | INTRAMUSCULAR | Status: DC
  Administered 2015-09-27 – 2015-09-29 (×6): 5000 [IU] via SUBCUTANEOUS
  Filled 2015-09-27 (×6): qty 1

## 2015-09-27 MED ORDER — LEVOTHYROXINE SODIUM 112 MCG PO TABS
112.0000 ug | ORAL_TABLET | Freq: Every day | ORAL | Status: DC
  Administered 2015-09-28: 112 ug via ORAL
  Filled 2015-09-27: qty 1

## 2015-09-27 MED ORDER — TIZANIDINE HCL 4 MG PO TABS
4.0000 mg | ORAL_TABLET | Freq: Four times a day (QID) | ORAL | Status: DC | PRN
  Administered 2015-09-28 – 2015-09-29 (×2): 4 mg via ORAL
  Filled 2015-09-27 (×3): qty 1

## 2015-09-27 MED ORDER — INSULIN ASPART 100 UNIT/ML ~~LOC~~ SOLN
3.0000 [IU] | Freq: Three times a day (TID) | SUBCUTANEOUS | Status: DC
  Administered 2015-09-28 (×3): 3 [IU] via SUBCUTANEOUS

## 2015-09-27 MED ORDER — SODIUM CHLORIDE 0.9 % IV SOLN: INTRAVENOUS | Status: DC

## 2015-09-27 MED ORDER — INSULIN ASPART 100 UNIT/ML ~~LOC~~ SOLN: 0.0000 [IU] | Freq: Every day | SUBCUTANEOUS | Status: DC

## 2015-09-27 MED ORDER — INSULIN DETEMIR 100 UNIT/ML ~~LOC~~ SOLN
10.0000 [IU] | Freq: Two times a day (BID) | SUBCUTANEOUS | Status: DC
Start: 2015-09-27 — End: 2015-09-28
  Administered 2015-09-27 – 2015-09-28 (×2): 10 [IU] via SUBCUTANEOUS
  Filled 2015-09-27 (×3): qty 0.1

## 2015-09-27 MED ORDER — GADOBENATE DIMEGLUMINE 529 MG/ML IV SOLN
20.0000 mL | Freq: Once | INTRAVENOUS | Status: AC | PRN
  Administered 2015-09-27: 20 mL via INTRAVENOUS

## 2015-09-27 MED ORDER — SODIUM CHLORIDE 0.9% FLUSH
3.0000 mL | Freq: Two times a day (BID) | INTRAVENOUS | Status: DC
  Administered 2015-09-27 – 2015-09-29 (×3): 3 mL via INTRAVENOUS

## 2015-09-27 MED ORDER — FUROSEMIDE 40 MG PO TABS
40.0000 mg | ORAL_TABLET | Freq: Every day | ORAL | Status: DC
  Administered 2015-09-28 – 2015-09-29 (×2): 40 mg via ORAL
  Filled 2015-09-27 (×2): qty 1

## 2015-09-27 MED ORDER — SIMVASTATIN 40 MG PO TABS
40.0000 mg | ORAL_TABLET | Freq: Every day | ORAL | Status: DC
  Administered 2015-09-28: 40 mg via ORAL
  Filled 2015-09-27 (×2): qty 1

## 2015-09-27 MED ORDER — NICOTINE 21 MG/24HR TD PT24
21.0000 mg | MEDICATED_PATCH | Freq: Once | TRANSDERMAL | Status: AC
  Administered 2015-09-27: 21 mg via TRANSDERMAL
  Filled 2015-09-27: qty 1

## 2015-09-27 MED ORDER — CLOPIDOGREL BISULFATE 75 MG PO TABS
75.0000 mg | ORAL_TABLET | Freq: Every day | ORAL | Status: DC
  Administered 2015-09-28 – 2015-09-29 (×2): 75 mg via ORAL
  Filled 2015-09-27 (×3): qty 1

## 2015-09-27 MED ORDER — STROKE: EARLY STAGES OF RECOVERY BOOK
Freq: Once | Status: AC
Start: 2015-09-27 — End: 2015-09-28
  Administered 2015-09-28: 02:00:00
  Filled 2015-09-27: qty 1

## 2015-09-27 MED ORDER — INSULIN ASPART 100 UNIT/ML ~~LOC~~ SOLN
0.0000 [IU] | Freq: Three times a day (TID) | SUBCUTANEOUS | Status: DC
  Administered 2015-09-28: 2 [IU] via SUBCUTANEOUS
  Administered 2015-09-28 (×2): 0 [IU] via SUBCUTANEOUS
  Administered 2015-09-29 (×2): 3 [IU] via SUBCUTANEOUS

## 2015-09-27 MED ORDER — ONDANSETRON HCL 4 MG PO TABS: 4.0000 mg | ORAL_TABLET | Freq: Four times a day (QID) | ORAL | Status: DC | PRN

## 2015-09-27 MED ORDER — HYDROCODONE-ACETAMINOPHEN 7.5-325 MG PO TABS: 1.0000 | ORAL_TABLET | Freq: Four times a day (QID) | ORAL | Status: DC | PRN

## 2015-09-27 MED ORDER — SIMVASTATIN 40 MG PO TABS
40.0000 mg | ORAL_TABLET | Freq: Every day | ORAL | Status: DC
Start: 2015-09-27 — End: 2015-09-27

## 2015-09-27 MED ORDER — ONDANSETRON HCL 4 MG/2ML IJ SOLN: 4.0000 mg | Freq: Four times a day (QID) | INTRAMUSCULAR | Status: DC | PRN

## 2015-09-27 MED ORDER — ACETAMINOPHEN 325 MG PO TABS: 650.0000 mg | ORAL_TABLET | Freq: Four times a day (QID) | ORAL | Status: DC | PRN

## 2015-09-27 MED ORDER — ALBUTEROL SULFATE (2.5 MG/3ML) 0.083% IN NEBU: 2.5000 mg | INHALATION_SOLUTION | RESPIRATORY_TRACT | Status: DC | PRN

## 2015-09-27 MED ORDER — ACETAMINOPHEN 650 MG RE SUPP: 650.0000 mg | Freq: Four times a day (QID) | RECTAL | Status: DC | PRN

## 2015-09-27 MED ORDER — ASPIRIN EC 325 MG PO TBEC: 325.0000 mg | DELAYED_RELEASE_TABLET | Freq: Every day | ORAL | Status: DC

## 2015-09-27 MED ORDER — ALBUTEROL SULFATE HFA 108 (90 BASE) MCG/ACT IN AERS: 2.0000 | INHALATION_SPRAY | RESPIRATORY_TRACT | Status: DC | PRN

## 2015-09-27 MED ORDER — POLYETHYLENE GLYCOL 3350 17 G PO PACK: 17.0000 g | PACK | Freq: Every day | ORAL | Status: DC | PRN

## 2015-09-27 MED ORDER — ASPIRIN EC 325 MG PO TBEC
325.0000 mg | DELAYED_RELEASE_TABLET | Freq: Every day | ORAL | Status: DC
  Administered 2015-09-28 – 2015-09-29 (×2): 325 mg via ORAL
  Filled 2015-09-27 (×2): qty 1

## 2015-09-27 MED ORDER — TIOTROPIUM BROMIDE MONOHYDRATE 18 MCG IN CAPS: 18.0000 ug | ORAL_CAPSULE | Freq: Two times a day (BID) | RESPIRATORY_TRACT | Status: DC | PRN

## 2015-09-27 NOTE — H&P (Signed)
[facility] History and Physical     History and Physical:    MARTHA SOLTYS   BLT:[id number] DOB: 01-Nov-1955 DOA: 09/27/2015  Referring MD/provider: Dr. Leonette Monarch PCP: [name], MD   Chief Complaint: left leg weakness and facial drop  History of Present Illness:   KEDRON UNO is an 60 y.o. male past medical history of CVA in 2015 15 ongoing smoker with diabetes and lisinopril and A1c of 7.7 and essential hypertension that comes in for sudden onset of left leg weakness and dizziness. He also relates some left arm numbness. He went to bed and woke up this morning and his gait was still wobbly, during this time he also had left hand weakness and his wife notice facial droop. He denies any new medications, fever or loss of consciousness or falls. In the ED: CT of the head was done that showed no bleed and EKG electrodes and CBC within normal limits blood glucose was 196    ROS:   ROS  Constitutional: No fever, no chills;  Appetite normal; No weight loss, no weight gain, no fatigue.   HEENT: No blurry vision, no diplopia, no pharyngitis, no dysphagia  CV: No chest pain, no palpitations, no PND, no orthopnea, no edema.   Resp: No SOB, no cough, no pleuritic pain.  GI: No nausea, no vomiting, no diarrhea, no melena, no hematochezia, no constipation, no abdominal pain.   GU: No dysuria, no hematuria, no frequency, no urgency.  MSK: No myalgias, no arthralgias.   Neuro:  No headache, no focal neurological deficits, no history of seizures.   Psych: No depression, no anxiety.   Endo: No heat intolerance, no cold intolerance, no polyuria, no polydipsia   Skin: No rashes, no skin lesions.   Heme: No easy bruising.   Travel history: No recent travel.   Past Medical History:   Past Medical History  Diagnosis Date  . Depression   . GERD (gastroesophageal reflux disease)   . Hyperlipidemia 08/27/2014    takes Simvastatin daily  . Hypertension     takes Amlodipine and  Lisinopril daily  . Hypothyroidism 08/27/2014    takes Synthroid daily  . Anemia     takes Ferrous Sulfated daily  . Carotid stenosis 08/27/2014    takes Aspirin daily  . Diabetes mellitus type II     takes Metformin and Glipizide daily  . Muscle spasm     takes Zanaflex daily as needed  . Insomnia     takes Pamelor nightly  . COPD (chronic obstructive pulmonary disease) (HCC)     Albuterol daily as needed and Spiriva daily  . MVP (mitral valve prolapse)   . History of blood clots     in right leg;being followed by Dr.Brabham for this  . Shortness of breath dyspnea   . Peripheral neuropathy ([place])   . Headache     occasionally  . Dizziness   . Stroke ([place])   . Arthritis   . Joint pain     hips  . Peripheral edema     takes Furosemide daily as needed  . History of hiatal hernia   . History of gastric ulcer   . Urinary frequency   . Urinary urgency   . History of blood transfusion 09/2014    2 units-no abnormal reaction noted  . HTN (hypertension) 09/14/2015  . Chronic hepatitis C ([place]) 09/17/2015    Past Surgical History:   Past Surgical History  Procedure Laterality Date  . Neg  stress test  2000  . Rotator cuff repair Right 2009    multiple f/u Sxs/infection Tx  . Incision and drainage perirectal abscess  03/2009  . Esophagogastroduodenoscopy N/A 09/24/2014    Procedure: ESOPHAGOGASTRODUODENOSCOPY (EGD);  Surgeon: Winfield Cunas., MD;  Location: [facility] ENDOSCOPY;  Service: Endoscopy;  Laterality: N/A;  . I&d of right arm    . Endarterectomy Left 11/13/2014    Procedure: ENDARTERECTOMY CAROTID WITH PATCH ANGIOPLASTY;  Surgeon: Serafina Mitchell, MD;  [facility];  Service: Vascular;  Laterality: Left;    Social History:   Social History   Social History  . Marital Status: Single    Spouse Name: N/A  . Number of Children: 0  . Years of Education: N/A   Occupational History  . Disabled    Social History Main Topics  . Smoking status: Current Every Day Smoker  -- 2.00 packs/day for 48 years    Types: Cigarettes  . Smokeless tobacco: Never Used     Comment: 1-2 pks per day  . Alcohol Use: Yes     Comment: beer monthly  . Drug Use: No  . Sexual Activity: Yes   Other Topics Concern  . Not on file   Social History Narrative   Divorced with fiance.   Disabled since June 2010 - right shoulder   No children.          Family history:   Family History  Problem Relation Age of Onset  . Alcohol abuse Father   . Lung cancer Father   . Heart disease Mother     automated implantable cardioverter-defibrillator   . Diabetes type II Mother   . Lung cancer Maternal Grandmother   . Lung cancer Sister     Allergies   Doxycycline; Lipitor; and Oxycodone  Current Medications:   Prior to Admission medications   Medication Sig Start Date End Date Taking? Authorizing Provider  amLODipine (NORVASC) 5 MG tablet Take 1 tablet (5 mg total) by mouth daily. 10/08/14 10/08/15 Yes Biagio Borg, MD  Blood Pressure Monitor DEVI Use to check Blood pressure daily 05/25/14  Yes Biagio Borg, MD  ferrous sulfate 325 (65 FE) MG tablet Take 1 tablet (325 mg total) by mouth 3 (three) times daily with meals. 09/25/14  Yes Belkys A Regalado, MD  furosemide (LASIX) 40 MG tablet 1 tab by mouth in the AM, with a second dose in PM as needed only for swelling Patient taking differently: Take 40 mg by mouth daily. Can take second dose in evening as needed for swelling 08/27/14  Yes Biagio Borg, MD  glucosamine-chondroitin 500-400 MG tablet Take 2 tablets by mouth daily. Reported on 09/21/2015   Yes Historical Provider, MD  glucose blood test strip 1 each by Other route See admin instructions. Reported on 09/07/2015   Yes Historical Provider, MD  Lancets MISC 1 each by Other route See admin instructions. Reported on 09/07/2015   Yes Historical Provider, MD  levothyroxine (SYNTHROID, LEVOTHROID) 112 MCG tablet Take 1 tablet (112 mcg total) by mouth daily. 06/09/15  Yes Biagio Borg,  MD  lisinopril (PRINIVIL,ZESTRIL) 20 MG tablet Take 1 tablet (20 mg total) by mouth daily. 09/21/15  Yes Biagio Borg, MD  metFORMIN (GLUCOPHAGE-XR) 500 MG 24 hr tablet 3 tabs by mouth in the AM Patient taking differently: Take 1,000 mg by mouth daily with breakfast. Only taking 2 tablets 06/09/15  Yes Biagio Borg, MD  PROAIR HFA 108 [phone] BASE) MCG/ACT  inhaler INHALE 2 PUFFS BY MOUTH EVERY 6 HOURS AS NEEDED FOR WHEEZING OR SHORTNESS OF BREATH 11/06/14  Yes Biagio Borg, MD  tiZANidine (ZANAFLEX) 4 MG tablet Take 8 mg by mouth every 6 (six) hours as needed for muscle spasms.   Yes Historical Provider, MD  traMADol (ULTRAM) 50 MG tablet TAKE 1-2 TABLETS BY MOUTH UP TO FOUR TIMES DAILY 08/03/15  Yes Biagio Borg, MD  aspirin EC 325 MG tablet Take 1 tablet (325 mg total) by mouth daily. Patient not taking: Reported on 09/21/2015 09/25/14   Belkys A Regalado, MD  HYDROcodone-acetaminophen (NORCO) 7.5-325 MG tablet Take 1 tablet by mouth every 6 (six) hours as needed for moderate pain. Patient not taking: Reported on 09/21/2015 07/13/15   Biagio Borg, MD  ipratropium-albuterol (DUONEB) 0.5-2.5 (3) MG/3ML SOLN USE 3 ML VIA NEBULIZER EVERY 6 HOURS AS NEEDED Patient not taking: Reported on 09/21/2015 09/14/15   Biagio Borg, MD  ondansetron (ZOFRAN-ODT) 8 MG disintegrating tablet Take 1 tablet (8 mg total) by mouth every 8 (eight) hours as needed for nausea or vomiting. Patient not taking: Reported on 09/22/2015 10/21/14   Crecencio Mc, MD  Respiratory Therapy Supplies (NEBULIZER) DEVI Use as directed four times daily Patient not taking: Reported on 09/22/2015 09/14/15   Biagio Borg, MD  simvastatin (ZOCOR) 40 MG tablet Take 1 tablet (40 mg total) by mouth at bedtime. Patient not taking: Reported on 09/22/2015 09/21/15   Biagio Borg, MD  SPIRIVA HANDIHALER 18 MCG inhalation capsule INHALE 1 CAPSULE VIA [place] Patient not taking: Reported on 09/21/2015 11/06/14   Biagio Borg, MD  tiotropium [facility]) 18  MCG inhalation capsule Place 18 mcg into inhaler and inhale 2 (two) times daily as needed (for wheezing). Reported on 09/21/2015    Historical Provider, MD  tiZANidine (ZANAFLEX) 4 MG tablet TAKE 1 TABLET BY MOUTH EVERY 6 HOURS AS NEEDED FOR MUSCLE SPASMS 08/03/15   Biagio Borg, MD    Physical Exam:   Filed Vitals:   09/27/15 1430 09/27/15 1500 09/27/15 1530 09/27/15 1532  BP: 156/79 166/73 153/83 153/83  Pulse: 65 65 72 72  Temp:      Resp: '22 18 23 21  '$ SpO2: 97% 98% 100% 99%     Physical Exam: Blood pressure 153/83, pulse 72, temperature 98 F (36.7 C), resp. rate 21, SpO2 99 %. Gen: No acute distress. Head: Normocephalic, atraumatic. Eyes: PERRL, EOMI, sclerae nonicteric. Mouth: Oropharynx Neck: With a scar on the left side from previous endarterectomy Chest: Good air movement clear to auscultation. CV: Regular rate and rhythm with positive S1-S2 Abdomen: Soft, nontender, nondistended with normal active bowel sounds. Extremities: Extremities Skin: Warm and dry. Neuro: Alert and oriented times3; cranial nerves II through XII grossly intact. Psych: Mood and affect normal.   Data Review:    Labs: Basic Metabolic Panel:  Recent Labs Lab 09/27/15 1015 09/27/15 1030  NA 138 139  K 4.1 4.1  CL 102 98*  CO2 25  --   GLUCOSE 196* 196*  BUN 12 13  CREATININE 1.16 1.20  CALCIUM 8.5*  --    Liver Function Tests:  Recent Labs Lab 09/27/15 1015  AST 43*  ALT 30  ALKPHOS 131*  BILITOT 0.3  PROT 6.0*  ALBUMIN 2.1*   No results for input(s): LIPASE, AMYLASE in the last 168 hours. No results for input(s): AMMONIA in the last 168 hours. CBC:  Recent Labs Lab 09/27/15 1015 09/27/15 1030  WBC 5.0  --   NEUTROABS 2.9  --   HGB 13.0 13.9  HCT 39.8 41.0  MCV 80.6  --   PLT 191  --    Cardiac Enzymes: No results for input(s): CKTOTAL, CKMB, CKMBINDEX, TROPONINI in the last 168 hours.  BNP (last 3 results) No results for input(s): PROBNP in the last 8760  hours. CBG:  Recent Labs Lab 09/27/15 1047  GLUCAP 177*    Radiographic Studies: Ct Head Wo Contrast  09/27/2015  CLINICAL DATA:  Dizziness, numbness since this morning around 5 a.m. EXAM: CT HEAD WITHOUT CONTRAST TECHNIQUE: Contiguous axial images were obtained from the base of the skull through the vertex without intravenous contrast. COMPARISON:  09/23/2014 FINDINGS: Patchy chronic small vessel disease changes throughout the deep white matter. No acute intracranial abnormality. Specifically, no hemorrhage, hydrocephalus, mass lesion, acute infarction, or significant intracranial injury. No acute calvarial abnormality. Visualized paranasal sinuses and mastoids clear. Orbital soft tissues unremarkable. IMPRESSION: Mild chronic small vessel disease. No acute intracranial abnormality. Electronically Signed   By: Rolm Baptise M.D.   On: 09/27/2015 11:52   *I have personally reviewed the images above*  EKG: Independently reviewed. Sinus rhythm nonspecific T-wave changes and complete right bundle branch block.   Assessment/Plan:   Stroke (cerebrum) (HCC) HgbA1c, fasting lipid panel  MRI, MRA of the brain without contrast  PT, OT, Speech consult  Echocardiogram  Carotid dopplers  Prophylactic therapy-Antiplatelet med: Aspirin - dose 325 mg PO daily  risk factor modification  Cardiac Monitoring  Neurochecks q4h  Keep MAP 70 tobacco counseling cessation.  Type 2 diabetes mellitus with other circulatory complications (HCC) DC metformin start him on long-acting insulin twice a day plus sliding scale insulin. Check a hemoglobin A1c.  Hypertensive heart disease/Essential hypertension: Hold lisinopril continue Lasix at a [place] dose, DC Norvasc. And allow permissive hypertension.  Hyperlipidemia Continue statins.  Bilateral carotid artery stenosis Carotid Doppler.      DVT prophylaxis  Code Status: Full. Family Communication: wife Disposition Plan: Home when stable.  Time  spent: 65 min  Charlynne Cousins [facility] Pager [phone]  If 7PM-7AM, please contact night-coverage [email or web address] Password [email or web address] 09/27/2015, 4:06 PM

## 2015-09-27 NOTE — ED Provider Notes (Signed)
CSN: [id number]     Arrival date & time 09/27/15  1006 History   First MD Initiated Contact with Patient 09/27/15 1017     Chief Complaint  Patient presents with  . Numbness  . Gait Problem  . Facial Droop     (Consider location/radiation/quality/duration/timing/severity/associated sxs/prior Treatment) Patient is a 60 y.o. male presenting with neurologic complaint.  Neurologic Problem This is a new problem. The current episode started yesterday. The problem occurs constantly. The problem has been unchanged. Associated symptoms include numbness (left arm numbness) and weakness (BLE weakness. right facial droop). Pertinent negatives include no abdominal pain, chest pain, chills, congestion, coughing, fatigue, fever, headaches, nausea, rash, sore throat or vomiting. Nothing aggravates the symptoms. He has tried nothing for the symptoms. The treatment provided no relief.    Past Medical History  Diagnosis Date  . Depression   . GERD (gastroesophageal reflux disease)   . Hyperlipidemia 08/27/2014    takes Simvastatin daily  . Hypertension     takes Amlodipine and Lisinopril daily  . Hypothyroidism 08/27/2014    takes Synthroid daily  . Anemia     takes Ferrous Sulfated daily  . Carotid stenosis 08/27/2014    takes Aspirin daily  . Diabetes mellitus type II     takes Metformin and Glipizide daily  . Muscle spasm     takes Zanaflex daily as needed  . Insomnia     takes Pamelor nightly  . COPD (chronic obstructive pulmonary disease) (HCC)     Albuterol daily as needed and Spiriva daily  . MVP (mitral valve prolapse)   . History of blood clots     in right leg;being followed by Dr.Brabham for this  . Shortness of breath dyspnea   . Peripheral neuropathy ([place])   . Headache     occasionally  . Dizziness   . Stroke ([place])   . Arthritis   . Joint pain     hips  . Peripheral edema     takes Furosemide daily as needed  . History of hiatal hernia   . History of gastric ulcer   .  Urinary frequency   . Urinary urgency   . History of blood transfusion 09/2014    2 units-no abnormal reaction noted  . HTN (hypertension) 09/14/2015  . Chronic hepatitis C ([place]) 09/17/2015   Past Surgical History  Procedure Laterality Date  . Neg stress test  2000  . Rotator cuff repair Right 2009    multiple f/u Sxs/infection Tx  . Incision and drainage perirectal abscess  03/2009  . Esophagogastroduodenoscopy N/A 09/24/2014    Procedure: ESOPHAGOGASTRODUODENOSCOPY (EGD);  Surgeon: Winfield Cunas., MD;  Location: [facility] ENDOSCOPY;  Service: Endoscopy;  Laterality: N/A;  . I&d of right arm    . Endarterectomy Left 11/13/2014    Procedure: ENDARTERECTOMY CAROTID WITH PATCH ANGIOPLASTY;  Surgeon: Serafina Mitchell, MD;  Location: [facility] OR;  Service: Vascular;  Laterality: Left;   Family History  Problem Relation Age of Onset  . Alcohol abuse Father   . Lung cancer Father   . Heart disease Mother     automated implantable cardioverter-defibrillator   . Diabetes type II Mother   . Lung cancer Maternal Grandmother   . Lung cancer Sister    Social History  Substance Use Topics  . Smoking status: Current Every Day Smoker -- 2.00 packs/day for 48 years    Types: Cigarettes  . Smokeless tobacco: Never Used     Comment: 1-2 pks per  day  . Alcohol Use: Yes     Comment: beer monthly    Review of Systems  Constitutional: Negative for fever, chills, appetite change and fatigue.  HENT: Negative for congestion, ear pain, facial swelling, mouth sores and sore throat.   Eyes: Negative for visual disturbance.  Respiratory: Negative for cough, chest tightness and shortness of breath.   Cardiovascular: Negative for chest pain and palpitations.  Gastrointestinal: Negative for nausea, vomiting, abdominal pain, diarrhea and blood in stool.  Endocrine: Negative for cold intolerance and heat intolerance.  Genitourinary: Negative for frequency, decreased urine volume and difficulty urinating.   Musculoskeletal: Negative for back pain and neck stiffness.  Skin: Negative for rash.  Neurological: Positive for dizziness, facial asymmetry, weakness (BLE weakness. right facial droop) and numbness (left arm numbness). Negative for syncope, speech difficulty, light-headedness and headaches.  All other systems reviewed and are negative.     Allergies  Doxycycline; Lipitor; and Oxycodone  Home Medications   Prior to Admission medications   Medication Sig Start Date End Date Taking? Authorizing Provider  amLODipine (NORVASC) 5 MG tablet Take 1 tablet (5 mg total) by mouth daily. 10/08/14 10/08/15 Yes Biagio Borg, MD  Blood Pressure Monitor DEVI Use to check Blood pressure daily 05/25/14  Yes Biagio Borg, MD  ferrous sulfate 325 (65 FE) MG tablet Take 1 tablet (325 mg total) by mouth 3 (three) times daily with meals. 09/25/14  Yes Belkys A Regalado, MD  furosemide (LASIX) 40 MG tablet 1 tab by mouth in the AM, with a second dose in PM as needed only for swelling Patient taking differently: Take 40 mg by mouth daily. Can take second dose in evening as needed for swelling 08/27/14  Yes Biagio Borg, MD  glucosamine-chondroitin 500-400 MG tablet Take 2 tablets by mouth daily. Reported on 09/21/2015   Yes Historical Provider, MD  glucose blood test strip 1 each by Other route See admin instructions. Reported on 09/07/2015   Yes Historical Provider, MD  Lancets MISC 1 each by Other route See admin instructions. Reported on 09/07/2015   Yes Historical Provider, MD  levothyroxine (SYNTHROID, LEVOTHROID) 112 MCG tablet Take 1 tablet (112 mcg total) by mouth daily. 06/09/15  Yes Biagio Borg, MD  lisinopril (PRINIVIL,ZESTRIL) 20 MG tablet Take 1 tablet (20 mg total) by mouth daily. 09/21/15  Yes Biagio Borg, MD  metFORMIN (GLUCOPHAGE-XR) 500 MG 24 hr tablet 3 tabs by mouth in the AM Patient taking differently: Take 1,000 mg by mouth daily with breakfast. Only taking 2 tablets 06/09/15  Yes Biagio Borg, MD   PROAIR HFA 108 (90 BASE) MCG/ACT inhaler INHALE 2 PUFFS BY MOUTH EVERY 6 HOURS AS NEEDED FOR WHEEZING OR SHORTNESS OF BREATH 11/06/14  Yes Biagio Borg, MD  tiZANidine (ZANAFLEX) 4 MG tablet Take 8 mg by mouth every 6 (six) hours as needed for muscle spasms.   Yes Historical Provider, MD  traMADol (ULTRAM) 50 MG tablet TAKE 1-2 TABLETS BY MOUTH UP TO FOUR TIMES DAILY 08/03/15  Yes Biagio Borg, MD  aspirin EC 325 MG tablet Take 1 tablet (325 mg total) by mouth daily. Patient not taking: Reported on 09/21/2015 09/25/14   Belkys A Regalado, MD  HYDROcodone-acetaminophen (NORCO) 7.5-325 MG tablet Take 1 tablet by mouth every 6 (six) hours as needed for moderate pain. Patient not taking: Reported on 09/21/2015 07/13/15   Biagio Borg, MD  ipratropium-albuterol (DUONEB) 0.5-2.5 (3) MG/3ML SOLN USE 3 ML VIA NEBULIZER EVERY  6 HOURS AS NEEDED Patient not taking: Reported on 09/21/2015 09/14/15   Biagio Borg, MD  ondansetron (ZOFRAN-ODT) 8 MG disintegrating tablet Take 1 tablet (8 mg total) by mouth every 8 (eight) hours as needed for nausea or vomiting. Patient not taking: Reported on 09/22/2015 10/21/14   Crecencio Mc, MD  Respiratory Therapy Supplies (NEBULIZER) DEVI Use as directed four times daily Patient not taking: Reported on 09/22/2015 09/14/15   Biagio Borg, MD  simvastatin (ZOCOR) 40 MG tablet Take 1 tablet (40 mg total) by mouth at bedtime. Patient not taking: Reported on 09/22/2015 09/21/15   Biagio Borg, MD  SPIRIVA HANDIHALER 18 MCG inhalation capsule INHALE 1 CAPSULE VIA [place] Patient not taking: Reported on 09/21/2015 11/06/14   Biagio Borg, MD  tiotropium [facility]) 18 MCG inhalation capsule Place 18 mcg into inhaler and inhale 2 (two) times daily as needed (for wheezing). Reported on 09/21/2015    Historical Provider, MD  tiZANidine (ZANAFLEX) 4 MG tablet TAKE 1 TABLET BY MOUTH EVERY 6 HOURS AS NEEDED FOR MUSCLE SPASMS 08/03/15   Biagio Borg, MD   BP 139/66 mmHg  Pulse 64   Temp(Src) 98 F (36.7 C)  Resp 18  SpO2 96% Physical Exam  Constitutional: He is oriented to person, place, and time. He appears well-nourished. No distress.  HENT:  Head: Normocephalic and atraumatic.  Right Ear: External ear normal.  Left Ear: External ear normal.  Eyes: Pupils are equal, round, and reactive to light. Right eye exhibits no discharge. Left eye exhibits no discharge. No scleral icterus.  Neck: Normal range of motion. Neck supple.  Cardiovascular: Normal rate.  Exam reveals no gallop and no friction rub.   No murmur heard. Pulmonary/Chest: Effort normal and breath sounds normal. No stridor. No respiratory distress. He has no wheezes. He has no rales. He exhibits no tenderness.  Abdominal: Soft. He exhibits no distension and no mass. There is no tenderness. There is no rebound and no guarding.  Musculoskeletal: He exhibits no edema or tenderness.  Neurological: He is alert and oriented to person, place, and time.  Cranial Nerves  II Visual Fields: Intact to confrontation. Visual fields intact. III, IV, VI: Pupils equal and reactive to light and near. Full eye movement without nystagmus  V Facial Sensation: Normal. No weakness of masticatory muscles  VII: right facial droop VIII Auditory Acuity: Grossly normal  IX/X: The uvula is midline; the palate elevates symmetrically  XI: Normal sternocleidomastoid and trapezius strength  XII: The tongue is midline. No atrophy or fasciculations.   Motor System: Muscle Strength: 5/5 and symmetric in the upper and lower extremities. No pronation or drift.  Muscle Tone: Tone and muscle bulk are normal in the upper and lower extremities.   Reflexes: DTRs: 1+ and symmetrical in all four extremities. Plantar responses are flexor bilaterally.  Coordination: Intact finger-to-nose, heel-to-shin, and rapid alternating movements. No tremor.  Sensation: Intact to light touch and pinprick.  Negative Romberg test.  Gait: ataxic   Skin: Skin  is warm and dry. No rash noted. He is not diaphoretic. No erythema.    ED Course  Procedures (including critical care time) Labs Review Labs Reviewed  COMPREHENSIVE METABOLIC PANEL - Abnormal; Notable for the following:    Glucose, Bld 196 (*)    Calcium 8.5 (*)    Total Protein 6.0 (*)    Albumin 2.1 (*)    AST 43 (*)    Alkaline Phosphatase 131 (*)  All other components within normal limits  CBG MONITORING, ED - Abnormal; Notable for the following:    Glucose-Capillary 177 (*)    All other components within normal limits  I-STAT CHEM 8, ED - Abnormal; Notable for the following:    Chloride 98 (*)    Glucose, Bld 196 (*)    Calcium, Ion 1.04 (*)    All other components within normal limits  PROTIME-INR  APTT  CBC  DIFFERENTIAL  I-STAT TROPOININ, ED  I-STAT TROPOININ, ED    Imaging Review Ct Head Wo Contrast  09/27/2015  CLINICAL DATA:  Dizziness, numbness since this morning around 5 a.m. EXAM: CT HEAD WITHOUT CONTRAST TECHNIQUE: Contiguous axial images were obtained from the base of the skull through the vertex without intravenous contrast. COMPARISON:  09/23/2014 FINDINGS: Patchy chronic small vessel disease changes throughout the deep white matter. No acute intracranial abnormality. Specifically, no hemorrhage, hydrocephalus, mass lesion, acute infarction, or significant intracranial injury. No acute calvarial abnormality. Visualized paranasal sinuses and mastoids clear. Orbital soft tissues unremarkable. IMPRESSION: Mild chronic small vessel disease. No acute intracranial abnormality. Electronically Signed   By: Rolm Baptise M.D.   On: 09/27/2015 11:52   I have personally reviewed and evaluated these images and lab results as part of my medical decision-making.   EKG Interpretation   Date/Time:  Monday September 27 2015 10:41:04 EST Ventricular Rate:  67 PR Interval:  192 QRS Duration: 117 QT Interval:  424 QTC Calculation: 448 R Axis:   -27 Text Interpretation:   Sinus rhythm Incomplete right bundle branch block  LVH with secondary repolarization abnormality Anterior infarct, old ST  depression in lateral leadsd new from previous Confirmed by LITTLE MD,  RACHEL [phone]) on 09/27/2015 11:22:17 AM      MDM   60 year old male with an extensive past medical history including prior strokes, hypertension, diabetes who presents ED with ataxia, left arm numbness, and right facial droop that began yesterday afternoon. Rest of the history as above.  On arrival patient is afebrile with stable vital signs. Well-appearing and well-hydrated, in no acute distress. Nontoxic appearing. Exam remarkable for right facial droop and decreased sensation in the left upper and lower extremities. Rest of the exam as above.  Presentation concerning for subacute stroke. CT head without acute findings. Labs nonspecific. EKG with normal sinus rhythm. Incomplete right bundle-branch block with likely LVH. Similar to prior on 2/17. No evidence of acute ischemia or arrhythmias. Patient denies any chest pain and troponin negative. Doubt ACS at this time.  Neurology consultation. Will admit to hospitalist service for subacute stroke workup and management.   Patient was seen in conjunction with Dr. Rex Kras.  Diagnostic studies were interpreted by me and use to my clinical decision-making.   Final diagnoses:  Facial droop  Left arm numbness  Ataxia        Addison Lank, MD 09/27/15 [place], MD 09/29/15 2050

## 2015-09-27 NOTE — ED Notes (Signed)
Attempted report x1. 

## 2015-09-27 NOTE — ED Notes (Signed)
Gave pt [place] sandwich and sprite. MD gave verbal for pt to eat

## 2015-09-27 NOTE — Telephone Encounter (Signed)
Shirlean Mylar called to advise, "patient is having dizziness, facial droopiness, when he walks, he is staggering in to walls. Spoke with patient who confirmed the same, states it feels like his legs don't belong to him". Patient and Shirlean Mylar advised to proceed to hospital or call 911. They will proceed to hospital.

## 2015-09-27 NOTE — Consult Note (Addendum)
Referring Physician: Sharlett Iles, MD    Chief Complaint: Dizziness with gait instability.  HPI: Brad [name] is an 60 y.o. male who presents with a chief complaint of new-onset gait unsteadiness and subjective dizziness, as well as left arm sensory numbness and weakness. He states that he was last normal at about 5 PM yesterday, when began experiencing gait unsteadiness "like my legs would not move where I wanted them to". Gait was wobbly with a veering quality, but no definite tendency to fall preferentially to any given side. Also had left hand weakness noticed this AM while eating breakfast - "the bowl slipped out of my hand". At baseline he has left sided weakness secondary to a "brainstem stroke" about one year ago. At that time a brain scan had shown "a large old stroke on the left side of my head" that per patient was never symptomatic. He has a history of left CEA.   Endorses mild headache and leg pain. Also with some blurring of vision in temporal visual field of right eye. Denies chest or abdominal pain.   LSN: Approximately 5 PM Sunday.  tPA Given: Not a tPA candidate based upon time criteria.   Past Medical History  Diagnosis Date  . Depression   . GERD (gastroesophageal reflux disease)   . Hyperlipidemia 08/27/2014    takes Simvastatin daily  . Hypertension     takes Amlodipine and Lisinopril daily  . Hypothyroidism 08/27/2014    takes Synthroid daily  . Anemia     takes Ferrous Sulfated daily  . Carotid stenosis 08/27/2014    takes Aspirin daily  . Diabetes mellitus type II     takes Metformin and Glipizide daily  . Muscle spasm     takes Zanaflex daily as needed  . Insomnia     takes Pamelor nightly  . COPD (chronic obstructive pulmonary disease) (HCC)     Albuterol daily as needed and Spiriva daily  . MVP (mitral valve prolapse)   . History of blood clots     in right leg;being followed by Dr.Brabham for this  . Shortness of breath dyspnea   .  Peripheral neuropathy ([place])   . Headache     occasionally  . Dizziness   . Stroke ([place])   . Arthritis   . Joint pain     hips  . Peripheral edema     takes Furosemide daily as needed  . History of hiatal hernia   . History of gastric ulcer   . Urinary frequency   . Urinary urgency   . History of blood transfusion 09/2014    2 units-no abnormal reaction noted  . HTN (hypertension) 09/14/2015  . Chronic hepatitis C ([place]) 09/17/2015    Past Surgical History  Procedure Laterality Date  . Neg stress test  2000  . Rotator cuff repair Right 2009    multiple f/u Sxs/infection Tx  . Incision and drainage perirectal abscess  03/2009  . Esophagogastroduodenoscopy N/A 09/24/2014    Procedure: ESOPHAGOGASTRODUODENOSCOPY (EGD);  Surgeon: Winfield Cunas., MD;  Location: [facility] ENDOSCOPY;  Service: Endoscopy;  Laterality: N/A;  . I&d of right arm    . Endarterectomy Left 11/13/2014    Procedure: ENDARTERECTOMY CAROTID WITH PATCH ANGIOPLASTY;  Surgeon: Serafina Mitchell, MD;  [facility] OR;  Service: Vascular;  Laterality: Left;    Family History  Problem Relation Age of Onset  . Alcohol abuse Father   . Lung cancer Father   . Heart disease  Mother     automated implantable cardioverter-defibrillator   . Diabetes type II Mother   . Lung cancer Maternal Grandmother   . Lung cancer Sister    Social History:  reports that he has been smoking Cigarettes.  He has a 96 pack-year smoking history. He has never used smokeless tobacco. He reports that he drinks alcohol. He reports that he does not use illicit drugs.  Allergies:  Allergies  Allergen Reactions  . Doxycycline Hives and Itching  . Lipitor [Atorvastatin] Other (See Comments)    Myalgia   . Oxycodone Itching    Medications: No medications listed in EPIC. States he takes ASA, "a blood pressure medicine" and "medicine for diabetes".  ROS: As per HPI.   Physical Examination: NAD. Obese. Wheezing noted. Mild pitting edema distal lower  extremities.  Blood pressure 139/66, pulse 64, temperature 98 F (36.7 C), resp. rate 18, SpO2 96 %.  Neurologic Examination: Mentation: Intact to complex questions and commands. Fluent speech with intact comprehension. No dysarthria.  CN: Visual fields intact to confrontation testing bilaterally. PERRL. EOMI without nystagmus. V- decreased sensation to left face. VII- left facial droop. VIII- hearing intact to conversation. IX,X,XI - no hoarseness or hypophonia. XII - slight lag on left with shoulder shrug.  Motor: LUE 4/5 biceps, triceps. 4+/5 grip. RUE 5/5 except for deltoid weakness secondary to prior soft tissue infection. LLE 4+/5. RLE 4+/5.  Sensory: Decreased fine touch sensation left upper and lower extremity.  Reflexes: 2+ bilateral upper extremities. 2+ bilateral patellae. 0 bilateral achilles. Toes mute.  Cerebellar: No ataxia on FNF or heel-shin bilaterally.  Gait: Swaying with Romberg testing. Mild instability with forward ambulation.   Results for orders placed or performed during the hospital encounter of 09/27/15 (from the past 48 hour(s))  Protime-INR     Status: None   Collection Time: 09/27/15 10:15 AM  Result Value Ref Range   Prothrombin Time 14.5 11.6 - 15.2 seconds   INR 1.11 0.00 - 1.49  APTT     Status: None   Collection Time: 09/27/15 10:15 AM  Result Value Ref Range   aPTT 30 24 - 37 seconds  CBC     Status: None   Collection Time: 09/27/15 10:15 AM  Result Value Ref Range   WBC 5.0 4.0 - 10.5 K/uL   RBC 4.94 4.22 - 5.81 MIL/uL   Hemoglobin 13.0 13.0 - 17.0 g/dL   HCT 39.8 39.0 - 52.0 %   MCV 80.6 78.0 - 100.0 fL   MCH 26.3 26.0 - 34.0 pg   MCHC 32.7 30.0 - 36.0 g/dL   RDW 14.4 11.5 - 15.5 %   Platelets 191 150 - 400 K/uL  Differential     Status: None   Collection Time: 09/27/15 10:15 AM  Result Value Ref Range   Neutrophils Relative % 57 %   Neutro Abs 2.9 1.7 - 7.7 K/uL   Lymphocytes Relative 26 %   Lymphs Abs 1.3 0.7 - 4.0 K/uL   Monocytes  Relative 14 %   Monocytes Absolute 0.7 0.1 - 1.0 K/uL   Eosinophils Relative 2 %   Eosinophils Absolute 0.1 0.0 - 0.7 K/uL   Basophils Relative 1 %   Basophils Absolute 0.0 0.0 - 0.1 K/uL  Comprehensive metabolic panel     Status: Abnormal   Collection Time: 09/27/15 10:15 AM  Result Value Ref Range   Sodium 138 135 - 145 mmol/L   Potassium 4.1 3.5 - 5.1 mmol/L   Chloride 102  101 - 111 mmol/L   CO2 25 22 - 32 mmol/L   Glucose, Bld 196 (H) 65 - 99 mg/dL   BUN 12 6 - 20 mg/dL   Creatinine, Ser 1.16 0.61 - 1.24 mg/dL   Calcium 8.5 (L) 8.9 - 10.3 mg/dL   Total Protein 6.0 (L) 6.5 - 8.1 g/dL   Albumin 2.1 (L) 3.5 - 5.0 g/dL   AST 43 (H) 15 - 41 U/L   ALT 30 17 - 63 U/L   Alkaline Phosphatase 131 (H) 38 - 126 U/L   Total Bilirubin 0.3 0.3 - 1.2 mg/dL   GFR calc non Af Amer >60 >60 mL/min   GFR calc Af Amer >60 >60 mL/min    Comment: (NOTE) The eGFR has been calculated using the CKD EPI equation. This calculation has not been validated in all clinical situations. eGFR's persistently <60 mL/min signify possible Chronic Kidney Disease.    Anion gap 11 5 - 15  I-stat troponin, ED (not at [facility], [facility])     Status: None   Collection Time: 09/27/15 10:29 AM  Result Value Ref Range   Troponin i, poc 0.02 0.00 - 0.08 ng/mL   Comment 3            Comment: Due to the release kinetics of cTnI, a negative result within the first hours of the onset of symptoms does not rule out myocardial infarction with certainty. If myocardial infarction is still suspected, repeat the test at appropriate intervals.   I-Stat Chem 8, ED  (not at [facility], [facility])     Status: Abnormal   Collection Time: 09/27/15 10:30 AM  Result Value Ref Range   Sodium 139 135 - 145 mmol/L   Potassium 4.1 3.5 - 5.1 mmol/L   Chloride 98 (L) 101 - 111 mmol/L   BUN 13 6 - 20 mg/dL   Creatinine, Ser 1.20 0.61 - 1.24 mg/dL   Glucose, Bld 196 (H) 65 - 99 mg/dL   Calcium, Ion 1.04 (L) 1.12 - 1.23 mmol/L   TCO2 27 0 - 100 mmol/L    Hemoglobin 13.9 13.0 - 17.0 g/dL   HCT 41.0 39.0 - 52.0 %  CBG monitoring, ED     Status: Abnormal   Collection Time: 09/27/15 10:47 AM  Result Value Ref Range   Glucose-Capillary 177 (H) 65 - 99 mg/dL   Ct Head Wo Contrast  09/27/2015  CLINICAL DATA:  Dizziness, numbness since this morning around 5 a.m. EXAM: CT HEAD WITHOUT CONTRAST TECHNIQUE: Contiguous axial images were obtained from the base of the skull through the vertex without intravenous contrast. COMPARISON:  09/23/2014 FINDINGS: Patchy chronic small vessel disease changes throughout the deep white matter. No acute intracranial abnormality. Specifically, no hemorrhage, hydrocephalus, mass lesion, acute infarction, or significant intracranial injury. No acute calvarial abnormality. Visualized paranasal sinuses and mastoids clear. Orbital soft tissues unremarkable. IMPRESSION: Mild chronic small vessel disease. No acute intracranial abnormality. Electronically Signed   By: Rolm Baptise M.D.   On: 09/27/2015 11:52    Assessment: 60 y.o. male with possible lacunar infarction involving white matter tracts of right cerebral hemisphere.  Stroke Risk Factors - Smoking, DM, HTN, prior strokes, carotid atherosclerotic disease.   Plan: 1. HgbA1c, fasting lipid panel. 2. MRI and MRA  of the brain without contrast. 3. PT consult, OT consult, Speech consult. 4. Echocardiogram. 5. Carotid dopplers. 6. Prophylactic therapy with subcutaneous Lovenox and SCDs.  7. Telemetry monitoring for possible atrial fibrillation.  8. Frequent neuro checks. 9. Blood  pressure management.  10. Add Plavix to ASA.  11. Has listed allergy to Lipitor.    _0  signed: Dr. Kerney Elbe   _1 @ 09/27/2015, 1:35 PM

## 2015-09-27 NOTE — ED Notes (Signed)
Pt here for abnormal gait and left arm numbness that started yesterday evening.pt also has slight right sided facial droop. Pt slightly weaker grip on the right side. sts he feels in a fog.

## 2015-09-27 NOTE — Progress Notes (Signed)
Patient is admitted to room 5M19 at this time. Alert and in stable condition.

## 2015-09-28 ENCOUNTER — Ambulatory Visit (HOSPITAL_COMMUNITY): Payer: Medicare Other

## 2015-09-28 DIAGNOSIS — R29898 Other symptoms and signs involving the musculoskeletal system: Secondary | ICD-10-CM

## 2015-09-28 DIAGNOSIS — Z79899 Other long term (current) drug therapy: Secondary | ICD-10-CM | POA: Diagnosis not present

## 2015-09-28 DIAGNOSIS — K219 Gastro-esophageal reflux disease without esophagitis: Secondary | ICD-10-CM | POA: Diagnosis present

## 2015-09-28 DIAGNOSIS — D649 Anemia, unspecified: Secondary | ICD-10-CM | POA: Diagnosis present

## 2015-09-28 DIAGNOSIS — Z6836 Body mass index (BMI) 36.0-36.9, adult: Secondary | ICD-10-CM | POA: Diagnosis not present

## 2015-09-28 DIAGNOSIS — G4700 Insomnia, unspecified: Secondary | ICD-10-CM | POA: Diagnosis present

## 2015-09-28 DIAGNOSIS — Z7982 Long term (current) use of aspirin: Secondary | ICD-10-CM | POA: Diagnosis not present

## 2015-09-28 DIAGNOSIS — R29810 Facial weakness: Secondary | ICD-10-CM

## 2015-09-28 DIAGNOSIS — F329 Major depressive disorder, single episode, unspecified: Secondary | ICD-10-CM | POA: Diagnosis present

## 2015-09-28 DIAGNOSIS — I639 Cerebral infarction, unspecified: Secondary | ICD-10-CM | POA: Diagnosis not present

## 2015-09-28 DIAGNOSIS — I6789 Other cerebrovascular disease: Secondary | ICD-10-CM | POA: Diagnosis not present

## 2015-09-28 DIAGNOSIS — I69354 Hemiplegia and hemiparesis following cerebral infarction affecting left non-dominant side: Secondary | ICD-10-CM | POA: Diagnosis not present

## 2015-09-28 DIAGNOSIS — B182 Chronic viral hepatitis C: Secondary | ICD-10-CM | POA: Diagnosis present

## 2015-09-28 DIAGNOSIS — E785 Hyperlipidemia, unspecified: Secondary | ICD-10-CM | POA: Diagnosis not present

## 2015-09-28 DIAGNOSIS — E669 Obesity, unspecified: Secondary | ICD-10-CM | POA: Diagnosis present

## 2015-09-28 DIAGNOSIS — E039 Hypothyroidism, unspecified: Secondary | ICD-10-CM | POA: Diagnosis present

## 2015-09-28 DIAGNOSIS — I10 Essential (primary) hypertension: Secondary | ICD-10-CM | POA: Diagnosis not present

## 2015-09-28 DIAGNOSIS — H538 Other visual disturbances: Secondary | ICD-10-CM | POA: Diagnosis present

## 2015-09-28 DIAGNOSIS — I63231 Cerebral infarction due to unspecified occlusion or stenosis of right carotid arteries: Secondary | ICD-10-CM | POA: Diagnosis present

## 2015-09-28 DIAGNOSIS — J449 Chronic obstructive pulmonary disease, unspecified: Secondary | ICD-10-CM | POA: Diagnosis present

## 2015-09-28 DIAGNOSIS — Z7984 Long term (current) use of oral hypoglycemic drugs: Secondary | ICD-10-CM | POA: Diagnosis not present

## 2015-09-28 DIAGNOSIS — I6523 Occlusion and stenosis of bilateral carotid arteries: Secondary | ICD-10-CM | POA: Diagnosis not present

## 2015-09-28 DIAGNOSIS — E1140 Type 2 diabetes mellitus with diabetic neuropathy, unspecified: Secondary | ICD-10-CM | POA: Diagnosis present

## 2015-09-28 DIAGNOSIS — I119 Hypertensive heart disease without heart failure: Secondary | ICD-10-CM | POA: Diagnosis present

## 2015-09-28 DIAGNOSIS — R269 Unspecified abnormalities of gait and mobility: Secondary | ICD-10-CM | POA: Diagnosis present

## 2015-09-28 DIAGNOSIS — I4510 Unspecified right bundle-branch block: Secondary | ICD-10-CM | POA: Diagnosis present

## 2015-09-28 DIAGNOSIS — F17210 Nicotine dependence, cigarettes, uncomplicated: Secondary | ICD-10-CM | POA: Diagnosis present

## 2015-09-28 DIAGNOSIS — I6521 Occlusion and stenosis of right carotid artery: Secondary | ICD-10-CM | POA: Diagnosis not present

## 2015-09-28 LAB — BASIC METABOLIC PANEL
Anion gap: 7 (ref 5–15)
BUN: 15 mg/dL (ref 6–20)
CO2: 28 mmol/L (ref 22–32)
Calcium: 8.6 mg/dL — ABNORMAL LOW (ref 8.9–10.3)
Chloride: 103 mmol/L (ref 101–111)
Creatinine, Ser: 1.21 mg/dL (ref 0.61–1.24)
GFR calc Af Amer: >60 mL/min (ref >60)
GFR calc non Af Amer: >60 mL/min (ref >60)
Glucose, Bld: 159 mg/dL — ABNORMAL HIGH (ref 65–99)
Potassium: 3.8 mmol/L (ref 3.5–5.1)
Sodium: 138 mmol/L (ref 135–145)

## 2015-09-28 LAB — CBC
HCT: 41.6 % (ref 39.0–52.0)
Hemoglobin: 13.9 g/dL (ref 13.0–17.0)
MCH: 26.8 pg (ref 26.0–34.0)
MCHC: 33.4 g/dL (ref 30.0–36.0)
MCV: 80.3 fL (ref 78.0–100.0)
Platelets: 223 10*3/uL (ref 150–400)
RBC: 5.18 MIL/uL (ref 4.22–5.81)
RDW: 14.4 % (ref 11.5–15.5)
WBC: 5.7 10*3/uL (ref 4.0–10.5)

## 2015-09-28 LAB — GLUCOSE, CAPILLARY
Glucose-Capillary: 160 mg/dL — ABNORMAL HIGH (ref 65–99)
Glucose-Capillary: 125 mg/dL — ABNORMAL HIGH (ref 65–99)
Glucose-Capillary: 114 mg/dL — ABNORMAL HIGH (ref 65–99)
Glucose-Capillary: 135 mg/dL — ABNORMAL HIGH (ref 65–99)

## 2015-09-28 LAB — MRSA PCR SCREENING: MRSA by PCR: NEGATIVE

## 2015-09-28 MED ORDER — LEVOTHYROXINE SODIUM 25 MCG PO TABS
125.0000 ug | ORAL_TABLET | Freq: Every day | ORAL | Status: DC
  Administered 2015-09-29: 125 ug via ORAL
  Filled 2015-09-28: qty 1

## 2015-09-28 MED ORDER — AMLODIPINE BESYLATE 5 MG PO TABS
5.0000 mg | ORAL_TABLET | Freq: Every day | ORAL | Status: DC
  Administered 2015-09-28 – 2015-09-29 (×2): 5 mg via ORAL
  Filled 2015-09-28 (×2): qty 1

## 2015-09-28 MED ORDER — INSULIN DETEMIR 100 UNIT/ML ~~LOC~~ SOLN
10.0000 [IU] | Freq: Every day | SUBCUTANEOUS | Status: DC
  Administered 2015-09-29: 10 [IU] via SUBCUTANEOUS
  Filled 2015-09-28: qty 0.1

## 2015-09-28 MED ORDER — LISINOPRIL 20 MG PO TABS
20.0000 mg | ORAL_TABLET | Freq: Every day | ORAL | Status: DC
Start: 2015-09-28 — End: 2015-09-29
  Administered 2015-09-28 – 2015-09-29 (×2): 20 mg via ORAL
  Filled 2015-09-28 (×2): qty 1

## 2015-09-28 NOTE — Evaluation (Signed)
Physical Therapy Evaluation and Discharge Patient Details Name: Brad [name] MRN: [id number] DOB: [birth year] Today's Date: 09/28/2015   History of Present Illness  Pt is a 60 y/o male who presents with LLE weakness, L arm numbness, and L hand weakness. MRI revealed acute ischemic infarct to R basal ganglia/corona radiata.   Clinical Impression  Patient evaluated by Physical Therapy with no further acute PT needs identified. All education has been completed and the patient has no further questions. At the time of PT eval pt was able to perform transfers and ambulation with modified independence. Pt reports he is at baseline of function, and only has complaints of back pain and faint sensation decrease in L palm. See below for any follow-up Physical Therapy or equipment needs. PT is signing off. Thank you for this referral.     Follow Up Recommendations No PT follow up;Supervision for mobility/OOB    Equipment Recommendations  None recommended by PT    Recommendations for Other Services       Precautions / Restrictions Precautions Precautions: Fall Precaution Comments: Pt reports back pain that started ~3 days ago. Restrictions Weight Bearing Restrictions: No      Mobility  Bed Mobility               General bed mobility comments: Pt sitting up on EOB when PT arrived.   Transfers Overall transfer level: Needs assistance Equipment used: None Transfers: Sit to/from Stand Sit to Stand: Modified independent (Device/Increase time)         General transfer comment: No assist required.   Ambulation/Gait Ambulation/Gait assistance: Modified independent (Device/Increase time) Ambulation Distance (Feet): 500 Feet Assistive device: None Gait Pattern/deviations: Step-through pattern;Decreased stride length;Trunk flexed   Gait velocity interpretation: at or above normal speed for age/gender General Gait Details: No unsteadiness or LOB noted. Pt able to step over objects  and change direction without difficulty.   Stairs            Wheelchair Mobility    Modified Rankin (Stroke Patients Only)       Balance Overall balance assessment: No apparent balance deficits (not formally assessed)                                           Pertinent Vitals/Pain Pain Assessment: 0-10 Pain Score: 5  Pain Location: back - pt states pinched nerve which is causing pain to shoot down his RLE.  Pain Descriptors / Indicators: Aching Pain Intervention(s): Limited activity within patient's tolerance;Monitored during session;Repositioned;Heat applied    Home Living Family/patient expects to be discharged to:: Private residence Living Arrangements: Spouse/significant other Available Help at Discharge: Family;Available 24 hours/day Type of Home: House Home Access: Stairs to enter   [employer] of Steps: 2 Home Layout: One level Home Equipment: Shower seat;Crutches      Prior Function Level of Independence: Independent               Hand Dominance   Dominant Hand: Right    Extremity/Trunk Assessment   Upper Extremity Assessment: RUE deficits/detail;LUE deficits/detail RUE Deficits / Details: Prior injury and surgery resulting in baseline AROM and strength deficits.      LUE Deficits / Details: Pt reports a "tear" in his shoulder that he is waiting to have surgery on (pt reports he wants to get surgery next year).    Lower Extremity Assessment: Overall WFL for  tasks assessed      Cervical / Trunk Assessment: Other exceptions  Communication   Communication: No difficulties  Cognition Arousal/Alertness: Awake/alert Behavior During Therapy: WFL for tasks assessed/performed Overall Cognitive Status: Within Functional Limits for tasks assessed                      General Comments      Exercises        Assessment/Plan    PT Assessment Patent does not need any further PT services  PT Diagnosis  Difficulty walking   PT Problem List    PT Treatment Interventions     PT Goals (Current goals can be found in the Care Plan section) Acute Rehab PT Goals PT Goal Formulation: All assessment and education complete, DC therapy    Frequency     Barriers to discharge        Co-evaluation               End of Session   Activity Tolerance: Patient tolerated treatment well Patient left: in chair;with call bell/phone within reach Nurse Communication: Mobility status    Functional Assessment Tool Used: Clinical judgement Functional Limitation: Mobility: Walking and moving around Mobility: Walking and Moving Around Current Status ([id number]): At least 1 percent but less than 20 percent impaired, limited or restricted Mobility: Walking and Moving Around Goal Status [phone]): At least 1 percent but less than 20 percent impaired, limited or restricted Mobility: Walking and Moving Around Discharge Status [phone]): At least 1 percent but less than 20 percent impaired, limited or restricted    Time: 1003-1020 PT Time Calculation (min) (ACUTE ONLY): 17 min   Charges:   PT Evaluation $PT Eval Moderate Complexity: 1 Procedure     PT G Codes:   PT G-Codes **NOT FOR INPATIENT CLASS** Functional Assessment Tool Used: Clinical judgement Functional Limitation: Mobility: Walking and moving around Mobility: Walking and Moving Around Current Status ([id number]): At least 1 percent but less than 20 percent impaired, limited or restricted Mobility: Walking and Moving Around Goal Status [phone]): At least 1 percent but less than 20 percent impaired, limited or restricted Mobility: Walking and Moving Around Discharge Status [phone]): At least 1 percent but less than 20 percent impaired, limited or restricted    Rolinda Roan 09/28/2015, 10:50 AM   Rolinda Roan, PT, DPT Acute Rehabilitation Services Pager: [phone]

## 2015-09-28 NOTE — Consult Note (Signed)
CC:    History of Present Illness:  Patient is a 60 y.o. year old male who presents for evaluation of right carotid stenosis.  Symptoms related to this stenosis include left leg weakness, left arm numbness, and and dizziness.  The patient denies  amaurosis.  He has a history of CVA in 2015 withsymptoms of right-sided facial weakness and drooping. He also had blurry vision.  He under went left CEA by Dr. Trula Slade on 11/13/2014.  The patient is currently on Plavix.    Other medical problems include hypertension managed with lisinopril, COPD, DM II managed with metformin  and hypercholesterolemia managed with Zocor.  .    Past Medical History  Diagnosis Date  . Depression   . GERD (gastroesophageal reflux disease)   . Hyperlipidemia 08/27/2014    takes Simvastatin daily  . Hypertension     takes Amlodipine and Lisinopril daily  . Hypothyroidism 08/27/2014    takes Synthroid daily  . Anemia     takes Ferrous Sulfated daily  . Carotid stenosis 08/27/2014    takes Aspirin daily  . Diabetes mellitus type II     takes Metformin and Glipizide daily  . Muscle spasm     takes Zanaflex daily as needed  . Insomnia     takes Pamelor nightly  . COPD (chronic obstructive pulmonary disease) (HCC)     Albuterol daily as needed and Spiriva daily  . MVP (mitral valve prolapse)   . History of blood clots     in right leg;being followed by Dr.Cashius Grandstaff for this  . Shortness of breath dyspnea   . Peripheral neuropathy ([place])   . Headache     occasionally  . Dizziness   . Stroke ([place])   . Arthritis   . Joint pain     hips  . Peripheral edema     takes Furosemide daily as needed  . History of hiatal hernia   . History of gastric ulcer   . Urinary frequency   . Urinary urgency   . History of blood transfusion 09/2014    2 units-no abnormal reaction noted  . HTN (hypertension) 09/14/2015  . Chronic hepatitis C ([place]) 09/17/2015    Past Surgical History  Procedure Laterality Date  . Neg stress test   2000  . Rotator cuff repair Right 2009    multiple f/u Sxs/infection Tx  . Incision and drainage perirectal abscess  03/2009  . Esophagogastroduodenoscopy N/A 09/24/2014    Procedure: ESOPHAGOGASTRODUODENOSCOPY (EGD);  Surgeon: Winfield Cunas., MD;  Location: [facility] ENDOSCOPY;  Service: Endoscopy;  Laterality: N/A;  . I&d of right arm    . Endarterectomy Left 11/13/2014    Procedure: ENDARTERECTOMY CAROTID WITH PATCH ANGIOPLASTY;  Surgeon: Serafina Mitchell, MD;  Location: [facility] OR;  Service: Vascular;  Laterality: Left;     Social History Social History  Substance Use Topics  . Smoking status: Current Every Day Smoker -- 2.00 packs/day for 48 years    Types: Cigarettes  . Smokeless tobacco: Never Used     Comment: 1-2 pks per day  . Alcohol Use: Yes     Comment: beer monthly    Family History Family History  Problem Relation Age of Onset  . Alcohol abuse Father   . Lung cancer Father   . Heart disease Mother     automated implantable cardioverter-defibrillator   . Diabetes type II Mother   . Lung cancer Maternal Grandmother   . Lung cancer Sister  Allergies  Allergies  Allergen Reactions  . Doxycycline Hives and Itching  . Lipitor [Atorvastatin] Other (See Comments)    Myalgia   . Oxycodone Itching     Current Facility-Administered Medications  Medication Dose Route Frequency Provider Last Rate Last Dose  . 0.9 %  sodium chloride infusion   Intravenous Continuous Charlynne Cousins, MD      . acetaminophen (TYLENOL) tablet 650 mg  650 mg Oral Q6H PRN Charlynne Cousins, MD       Or  . acetaminophen (TYLENOL) suppository 650 mg  650 mg Rectal Q6H PRN Charlynne Cousins, MD      . albuterol (PROVENTIL) (2.5 MG/3ML) 0.083% nebulizer solution 2.5 mg  2.5 mg Nebulization Q4H PRN Charlynne Cousins, MD      . aspirin EC tablet 325 mg  325 mg Oral Daily Charlynne Cousins, MD   325 mg at 09/28/15 0933  . clopidogrel (PLAVIX) tablet 75 mg  75 mg Oral Daily Charlynne Cousins, MD   75 mg at 09/28/15 8144  . furosemide (LASIX) tablet 40 mg  40 mg Oral Daily Charlynne Cousins, MD   40 mg at 09/28/15 8185  . heparin injection 5,000 Units  5,000 Units Subcutaneous 3 times per day Charlynne Cousins, MD   5,000 Units at 09/28/15 [phone]  . insulin aspart (novoLOG) injection 0-15 Units  0-15 Units Subcutaneous TID WC Charlynne Cousins, MD   0 Units at 09/28/15 1205  . insulin aspart (novoLOG) injection 0-5 Units  0-5 Units Subcutaneous QHS Charlynne Cousins, MD   0 Units at 09/27/15 2200  . insulin aspart (novoLOG) injection 3 Units  3 Units Subcutaneous TID WC Charlynne Cousins, MD   3 Units at 09/28/15 1205  . insulin detemir (LEVEMIR) injection 10 Units  10 Units Subcutaneous BID Charlynne Cousins, MD   10 Units at 09/28/15 [phone]  . levothyroxine (SYNTHROID, LEVOTHROID) tablet 112 mcg  112 mcg Oral QAC breakfast Charlynne Cousins, MD   112 mcg at 09/28/15 0751  . nicotine (NICODERM CQ - dosed in mg/24 hours) patch 21 mg  21 mg Transdermal Once Addison Lank, MD   21 mg at 09/27/15 1530  . ondansetron (ZOFRAN) tablet 4 mg  4 mg Oral Q6H PRN Charlynne Cousins, MD       Or  . ondansetron [facility]) injection 4 mg  4 mg Intravenous Q6H PRN Charlynne Cousins, MD      . polyethylene glycol [facility] / Floria Raveling) packet 17 g  17 g Oral Daily PRN Charlynne Cousins, MD      . simvastatin (ZOCOR) tablet 40 mg  40 mg Oral QHS Charlynne Cousins, MD   40 mg at 09/27/15 2200  . sodium chloride flush (NS) 0.9 % injection 3 mL  3 mL Intravenous Q12H Charlynne Cousins, MD   3 mL at 09/28/15 0936  . tiZANidine (ZANAFLEX) tablet 4 mg  4 mg Oral Q6H PRN Charlynne Cousins, MD   4 mg at 09/28/15 0801    ROS:   General:  No weight loss, Fever, chills  HEENT: No recent headaches, no nasal bleeding, no visual changes, no sore throat  Neurologic: No dizziness, blackouts, seizures. No recent symptoms of stroke or mini- stroke. No recent episodes of slurred speech,  or temporary blindness.  Cardiac: No recent episodes of chest pain/pressure, no shortness of breath at rest.  No shortness of breath with exertion.  Denies history of  atrial fibrillation or irregular heartbeat  Vascular: No history of rest pain in feet.  No history of claudication.  No history of non-healing ulcer, No history of DVT   Pulmonary: No home oxygen, no productive cough, no hemoptysis,  No asthma or wheezing  Musculoskeletal:  '[ ]'$  Arthritis, '[ ]'$  Low back pain,  '[ ]'$  Joint pain  Hematologic:No history of hypercoagulable state.  No history of easy bleeding.  No history of anemia  Gastrointestinal: No hematochezia or melena,  No gastroesophageal reflux, no trouble swallowing  Urinary: '[ ]'$  chronic Kidney disease, '[ ]'$  on HD - '[ ]'$  MWF or '[ ]'$  TTHS, '[ ]'$  Burning with urination, '[ ]'$  Frequent urination, '[ ]'$  Difficulty urinating;   Skin: No rashes  Psychological: No history of anxiety,  No history of depression   Physical Examination  Filed Vitals:   09/28/15 0130 09/28/15 0330 09/28/15 0530 09/28/15 0952  BP: 184/91 172/88 211/88 180/75  Pulse: 71 75 67 66  Temp: 98.4 F (36.9 C) 98.4 F (36.9 C) 97.9 F (36.6 C) 98.5 F (36.9 C)  TempSrc: Oral Oral Oral Oral  Resp: '20 18 18 18  '$ Height:      Weight:      SpO2: 97% 97% 99% 100%    Body mass index is 36.81 kg/(m^2).  General:  Alert and oriented, no acute distress HEENT: Normal Neck: No bruit or JVD Pulmonary: Clear to auscultation bilaterally Cardiac: Regular Rate and Rhythm without murmur Gastrointestinal: Soft, non-tender, non-distended, no mass, no scars Skin: No rash Extremity Pulses:  2+ radial, brachial, femoral, dorsalis pedis, posterior tibial pulses bilaterally Musculoskeletal: No deformity or edema  Neurologic: Upper and lower extremity motor 5/5 and symmetric, with exception of bilateral shoulder injuries.  DATA:  Carotid duplex Right ICA - stable 60-79% stenosis Left no recurrent stenosis  MRI with  contrast IMPRESSION: MRI HEAD IMPRESSION:  1. Acute ischemic infarct involving the right basal ganglia/ corona radiata without mass effect. Probable faint petechial hemorrhage without evidence for hemorrhagic transformation. 2. Additional punctate cortical infarct within the left occipital pole. Possible faint petechial hemorrhage with this infarct as well. 3. Age-related cerebral atrophy with mild chronic small vessel ischemic disease with additional scattered remote lacunar infarcts as above.   CTA IMPRESSION: 1. Heavy atheromatous plaque about the carotid bifurcations extending into the proximal ICAs bilaterally. There is associated stenoses of approximately 70-80% by NASCET criteria bilaterally, slightly worse on the left. The area of involvement extends from the carotid bifurcations bilaterally, and extends approximately 6 mm in length on the right, and 12 mm in length on the left. 2. Widely patent vertebral arteries bilaterally. The distal intracranial aspect of the right vertebral artery is markedly diminutive and stenotic as compared to the left. This is similar as seen on prior intracranial MRA.   ASSESSMENT:  Right CVA Right carotid stenosis symptomatic 60-80 carotid duplex and CTA of the neck.    PLAN: We will plan right CEA in the near future.  Continue Plavix.   Will discuss scheduling with Dr. Trula Slade.  [facility] Office: [phone]  I agree with the above.  I have seen and evaluated the patient.  He is well known to me, having undergone left carotid endarterectomy for symptomatic stenosis on 11/13/2014.  He has a history of remote infarct on the right, however he has been asymptomatic, therefore I have been following his carotid stenosis on the right which has been in the 60-79% range.  He recently presented  with left-sided symptoms and some blurry vision on the right.  MR I suggested right sided infarct and a small left  occipital pole infarct.  The patient continues to smoke, however he states that he is going to stop.  He is medically managed for diabetes which has been moderately controlled he is on a statin for hypercholesterolemia and a ACE inhibitor for hypertension.  I have reviewed his duplex which shows 60-79% stenosis.  I discussed with the patient that because of his symptoms and recent infarct, I think he needs to undergo right carotid endarterectomy.  I will need a formal evaluation by ENT to evaluate his vocal cords as he does have some hoarseness.  I will schedule his operation for next week and arrange outpatient ENT evaluation.  The patient wants to go home today.  I think that is reasonable as he is on optimal medical management and is currently asymptomatic.  I did discuss the details of the operation with him.  [name]

## 2015-09-28 NOTE — Progress Notes (Signed)
Stroke Team Progress Note  HISTORY Brad [name] is an 60 y.o. male who presents with a chief complaint of new-onset gait unsteadiness and subjective dizziness, as well as left arm sensory numbness and weakness. He states that he was last normal at about 5 PM yesterday, when began experiencing gait unsteadiness "like my legs would not move where I wanted them to". Gait was wobbly with a veering quality, but no definite tendency to fall preferentially to any given side. Also had left hand weakness noticed this AM while eating breakfast - "the bowl slipped out of my hand". At baseline he has left sided weakness secondary to a "brainstem stroke" about one year ago. At that time a brain scan had shown "a large old stroke on the left side of my head" that per patient was never symptomatic. He has a history of left CEA.   Endorses mild headache and leg pain. Also with some blurring of vision in temporal visual field of right eye. Denies chest or abdominal pain.   LSN: Approximately 5 PM Sunday.  tPA Given: Not a tPA candidate based upon time criteria Patient was not administered TPA secondary to delay in arrival. He was admitted to the neuro floor bed for further evaluation and treatment.  SUBJECTIVE His family  Is not at the bedside.  Overall he feels his condition is stable.  He has known history of right carotid stenosis which is moderate and being followed conservatively by vascular surgery. He has history of left carotid surgery in the past  OBJECTIVE Most recent Vital Signs: Filed Vitals:   09/28/15 0530 09/28/15 0952 09/28/15 1412 09/28/15 1730  BP: 211/88 180/75 192/73 194/85  Pulse: 67 66 64 73  Temp: 97.9 F (36.6 C) 98.5 F (36.9 C) 97.4 F (36.3 C) 98.2 F (36.8 C)  TempSrc: Oral Oral Oral Oral  Resp: '18 18 18 18  '$ Height:      Weight:      SpO2: 99% 100% 100% 100%   CBG (last 3)   Recent Labs  09/28/15 0656 09/28/15 1127 09/28/15 1643  GLUCAP 160* 125* 114*    IV  Fluid Intake:   . sodium chloride      MEDICATIONS  . aspirin EC  325 mg Oral Daily  . clopidogrel  75 mg Oral Daily  . furosemide  40 mg Oral Daily  . heparin  5,000 Units Subcutaneous 3 times per day  . insulin aspart  0-15 Units Subcutaneous TID WC  . insulin aspart  0-5 Units Subcutaneous QHS  . insulin aspart  3 Units Subcutaneous TID WC  . insulin detemir  10 Units Subcutaneous BID  . levothyroxine  112 mcg Oral QAC breakfast  . simvastatin  40 mg Oral QHS  . sodium chloride flush  3 mL Intravenous Q12H   PRN:  acetaminophen **OR** acetaminophen, albuterol, ondansetron **OR** ondansetron (ZOFRAN) IV, polyethylene glycol, tiZANidine  Diet:  Diet heart healthy/carb modified Room service appropriate?: Yes; Fluid consistency:: Thin   Activity:  Up with assistance  DVT Prophylaxis:  SCDs CLINICALLY SIGNIFICANT STUDIES Basic Metabolic Panel:  Recent Labs Lab 09/27/15 1015 09/27/15 1030 09/28/15 0605  NA 138 139 138  K 4.1 4.1 3.8  CL 102 98* 103  CO2 25  --  28  GLUCOSE 196* 196* 159*  BUN '12 13 15  '$ CREATININE 1.16 1.20 1.21  CALCIUM 8.5*  --  8.6*   Liver Function Tests:  Recent Labs Lab 09/27/15 1015  AST 43*  ALT 30  ALKPHOS 131*  BILITOT 0.3  PROT 6.0*  ALBUMIN 2.1*   CBC:  Recent Labs Lab 09/27/15 1015 09/27/15 1030 09/28/15 0605  WBC 5.0  --  5.7  NEUTROABS 2.9  --   --   HGB 13.0 13.9 13.9  HCT 39.8 41.0 41.6  MCV 80.6  --  80.3  PLT 191  --  223   Coagulation:  Recent Labs Lab 09/27/15 1015  LABPROT 14.5  INR 1.11   Cardiac Enzymes: No results for input(s): CKTOTAL, CKMB, CKMBINDEX, TROPONINI in the last 168 hours. Urinalysis: No results for input(s): COLORURINE, LABSPEC, PHURINE, GLUCOSEU, HGBUR, BILIRUBINUR, KETONESUR, PROTEINUR, UROBILINOGEN, NITRITE, LEUKOCYTESUR in the last 168 hours.  Invalid input(s): APPERANCEUR Lipid Panel    Component Value Date/Time   CHOL 168 06/09/2015 0842   TRIG 228.0* 06/09/2015 0842   HDL 30.60*  06/09/2015 0842   CHOLHDL 5 06/09/2015 0842   VLDL 45.6* 06/09/2015 0842   LDLCALC 38 09/24/2014 0811   HgbA1C  Lab Results  Component Value Date   HGBA1C 7.7* 06/09/2015    Urine Drug Screen:     Component Value Date/Time   LABOPIA NONE DETECTED 09/23/2014 1018   COCAINSCRNUR NONE DETECTED 09/23/2014 1018   LABBENZ NONE DETECTED 09/23/2014 1018   AMPHETMU NONE DETECTED 09/23/2014 1018   THCU NONE DETECTED 09/23/2014 1018   LABBARB NONE DETECTED 09/23/2014 1018    Alcohol Level: No results for input(s): ETH in the last 168 hours.  Ct Head Wo Contrast  09/27/2015  CLINICAL DATA:  Dizziness, numbness since this morning around 5 a.m. EXAM: CT HEAD WITHOUT CONTRAST TECHNIQUE: Contiguous axial images were obtained from the base of the skull through the vertex without intravenous contrast. COMPARISON:  09/23/2014 FINDINGS: Patchy chronic small vessel disease changes throughout the deep white matter. No acute intracranial abnormality. Specifically, no hemorrhage, hydrocephalus, mass lesion, acute infarction, or significant intracranial injury. No acute calvarial abnormality. Visualized paranasal sinuses and mastoids clear. Orbital soft tissues unremarkable. IMPRESSION: Mild chronic small vessel disease. No acute intracranial abnormality. Electronically Signed   By: Rolm Baptise M.D.   On: 09/27/2015 11:52   Mr Angiogram Neck W Wo Contrast  09/27/2015  CLINICAL DATA:  Initial evaluation for new onset dizziness with gait instability and left-sided weakness. EXAM: MRI HEAD WITHOUT CONTRAST MRA NECK WITHOUT AND WITH CONTRAST TECHNIQUE: Multiplanar, multiecho pulse sequences of the brain and surrounding structures were obtained without intravenous contrast. Angiographic images of the neck were obtained using MRA technique with and without intravenous contrast. Carotid stenosis measurements (when applicable) are obtained utilizing NASCET criteria, using the distal internal carotid diameter as the  denominator. CONTRAST:  76m MULTIHANCE GADOBENATE DIMEGLUMINE 529 MG/ML IV SOLN COMPARISON:  Prior CT from earlier the same day. FINDINGS: MRI HEAD FINDINGS Diffusion-weighted imaging demonstrates patchy restricted diffusion within the posterior right lentiform nucleus extending into the right corona radiata. No associated mass-effect. Probable faint petechial hemorrhage on SWI sequence without hemorrhagic transformation. Additional punctate cortical infarct within the left occipital pole noted (series 4, image 23). Possible faint petechial hemorrhage with this as well. No other areas of infarction. Major intracranial vascular flow voids are preserved. Gray-white matter differentiation otherwise maintained. Small focus of susceptibility artifact within the left cerebellar hemisphere noted, likely a small chronic micro hemorrhage. Mild diffuse prominence of the CSF containing spaces is compatible with generalized age-related cerebral atrophy. Patchy T2/FLAIR hyperintensity within the periventricular white matter most consistent with chronic small vessel ischemic disease, mild in nature. Small remote lacunar infarcts within the  bilateral thalami and left basal ganglia. Few small remote lacunar infarcts within the bilateral corona radiata as well. No mass lesion, midline shift, or mass effect. No hydrocephalus. No extra-axial fluid collection. Major dural sinuses appear patent. Craniocervical junction widely patent and normal in appearance. Scattered multilevel degenerative spondylolysis present within the upper cervical spine without significant stenosis. Pituitary gland within normal limits. No acute abnormality about the orbits. Paranasal sinuses are clear. Right mastoid effusion noted, likely benign. Inner ear structures grossly normal. Bone marrow signal intensity within normal limits. No scalp soft tissue abnormality. MRA NECK FINDINGS Study somewhat limited by motion artifact. Visualized aortic arch is of  normal caliber with normal branch pattern. Possible moderate to severe stenosis at the origin of left common carotid artery. No other high-grade stenosis at the origin of the great vessels. Visualized subclavian arteries are well opacified. Right common carotid artery patent from its origin to the bifurcation. There is a centric plaque at the proximal right ICA with secondary moderate to severe short-segment stenosis. Right ICA patent distally to the skullbase without additional stenosis. Possible moderate to severe stenosis at the origin of the left common carotid artery, not well evaluated in this exam. Left common carotid artery otherwise patent to the bifurcation. Mild atheromatous irregularity about the left carotid bifurcation without significant stenosis. Left ICA well opacified distally to the skullbase. Origin of the vertebral arteries not well evaluated on this exam. Vertebral arteries are patent distally with antegrade flow to the skullbase without occlusion or definite high-grade stenosis. The left vertebral artery is dominant. IMPRESSION: MRI HEAD IMPRESSION: 1. Acute ischemic infarct involving the right basal ganglia/ corona radiata without mass effect. Probable faint petechial hemorrhage without evidence for hemorrhagic transformation. 2. Additional punctate cortical infarct within the left occipital pole. Possible faint petechial hemorrhage with this infarct as well. 3. Age-related cerebral atrophy with mild chronic small vessel ischemic disease with additional scattered remote lacunar infarcts as above. MRA NECK IMPRESSION: 1. Limited study due to motion artifact. 2. Eccentric plaque at the proximal right ICA with secondary moderate to severe short-segment stenosis. Correlation with carotid Doppler ultrasound and/or CTA could be performed for further evaluation. 3. Possible moderate to severe stenosis at the origin of the left common carotid artery, poorly evaluated on this exam. 4. No other  critical or flow-limiting stenosis identified within the major arterial vasculature of the neck. 5. Poor evaluation of the proximal vertebral arteries. Vertebral arteries are patent distally with antegrade flow to the skullbase. Electronically Signed   By: Jeannine Boga M.D.   On: 09/27/2015 22:12   Mr Brain Wo Contrast  09/27/2015  CLINICAL DATA:  Initial evaluation for new onset dizziness with gait instability and left-sided weakness. EXAM: MRI HEAD WITHOUT CONTRAST MRA NECK WITHOUT AND WITH CONTRAST TECHNIQUE: Multiplanar, multiecho pulse sequences of the brain and surrounding structures were obtained without intravenous contrast. Angiographic images of the neck were obtained using MRA technique with and without intravenous contrast. Carotid stenosis measurements (when applicable) are obtained utilizing NASCET criteria, using the distal internal carotid diameter as the denominator. CONTRAST:  66m MULTIHANCE GADOBENATE DIMEGLUMINE 529 MG/ML IV SOLN COMPARISON:  Prior CT from earlier the same day. FINDINGS: MRI HEAD FINDINGS Diffusion-weighted imaging demonstrates patchy restricted diffusion within the posterior right lentiform nucleus extending into the right corona radiata. No associated mass-effect. Probable faint petechial hemorrhage on SWI sequence without hemorrhagic transformation. Additional punctate cortical infarct within the left occipital pole noted (series 4, image 23). Possible faint petechial hemorrhage with this as  well. No other areas of infarction. Major intracranial vascular flow voids are preserved. Gray-white matter differentiation otherwise maintained. Small focus of susceptibility artifact within the left cerebellar hemisphere noted, likely a small chronic micro hemorrhage. Mild diffuse prominence of the CSF containing spaces is compatible with generalized age-related cerebral atrophy. Patchy T2/FLAIR hyperintensity within the periventricular white matter most consistent with  chronic small vessel ischemic disease, mild in nature. Small remote lacunar infarcts within the bilateral thalami and left basal ganglia. Few small remote lacunar infarcts within the bilateral corona radiata as well. No mass lesion, midline shift, or mass effect. No hydrocephalus. No extra-axial fluid collection. Major dural sinuses appear patent. Craniocervical junction widely patent and normal in appearance. Scattered multilevel degenerative spondylolysis present within the upper cervical spine without significant stenosis. Pituitary gland within normal limits. No acute abnormality about the orbits. Paranasal sinuses are clear. Right mastoid effusion noted, likely benign. Inner ear structures grossly normal. Bone marrow signal intensity within normal limits. No scalp soft tissue abnormality. MRA NECK FINDINGS Study somewhat limited by motion artifact. Visualized aortic arch is of normal caliber with normal branch pattern. Possible moderate to severe stenosis at the origin of left common carotid artery. No other high-grade stenosis at the origin of the great vessels. Visualized subclavian arteries are well opacified. Right common carotid artery patent from its origin to the bifurcation. There is a centric plaque at the proximal right ICA with secondary moderate to severe short-segment stenosis. Right ICA patent distally to the skullbase without additional stenosis. Possible moderate to severe stenosis at the origin of the left common carotid artery, not well evaluated in this exam. Left common carotid artery otherwise patent to the bifurcation. Mild atheromatous irregularity about the left carotid bifurcation without significant stenosis. Left ICA well opacified distally to the skullbase. Origin of the vertebral arteries not well evaluated on this exam. Vertebral arteries are patent distally with antegrade flow to the skullbase without occlusion or definite high-grade stenosis. The left vertebral artery is  dominant. IMPRESSION: MRI HEAD IMPRESSION: 1. Acute ischemic infarct involving the right basal ganglia/ corona radiata without mass effect. Probable faint petechial hemorrhage without evidence for hemorrhagic transformation. 2. Additional punctate cortical infarct within the left occipital pole. Possible faint petechial hemorrhage with this infarct as well. 3. Age-related cerebral atrophy with mild chronic small vessel ischemic disease with additional scattered remote lacunar infarcts as above. MRA NECK IMPRESSION: 1. Limited study due to motion artifact. 2. Eccentric plaque at the proximal right ICA with secondary moderate to severe short-segment stenosis. Correlation with carotid Doppler ultrasound and/or CTA could be performed for further evaluation. 3. Possible moderate to severe stenosis at the origin of the left common carotid artery, poorly evaluated on this exam. 4. No other critical or flow-limiting stenosis identified within the major arterial vasculature of the neck. 5. Poor evaluation of the proximal vertebral arteries. Vertebral arteries are patent distally with antegrade flow to the skullbase. Electronically Signed   By: Jeannine Boga M.D.   On: 09/27/2015 22:12       Carotid Doppler  60-79 % RICA stenosis  2D Echocardiogram  pending    EKG  Sinus rhythm Incomplete right bundle branch block LVH with secondary repolarization abnormality  Therapy Recommendations  none Physical Exam  Obese middle aged male not in distress. . Afebrile. Head is nontraumatic. Neck is supple without bruit.    Cardiac exam no murmur or gallop. Lungs are clear to auscultation. Distal pulses are well felt. Scar from prior left carotid endarterectomy  on the left side in the neck Neurological Exam ;  Awake  Alert oriented x 3. Normal speech and language.eye movements full without nystagmus.fundi were not visualized. Vision acuity and fields appear normal. Hearing is normal. Palatal movements are normal.  Face symmetric. Tongue midline. Normal strength, tone, reflexes and coordination. Normal sensation. Gait deferred. ASSESSMENT Mr. [name] is a 60 y.o. male presenting with transient left-sided weakness from right basal ganglia infarct likely symptomatic from moderate proximal right carotid stenosis..  On aspirin 325 mg daily prior to admission. Now on aspirin 325 mg daily and clopidogrel 75 mg daily for secondary stroke prevention. Patient with resultant  No deficits. Stroke work up underway.   Diabtees  LDL pending  Obesity   Hospital day # 0  TREATMENT/PLAN  Continue plavix  for secondary stroke prevention.  Agree with vascular surgery consult and early revascularization of the right carotid in 1-2 weeks  Patient remains at risk for recurrent stroke, TIA, neurological worsening and was counseled on need to quit smoking and aggressive risk factor modification with tight control of blood pressure with goal below 130/90, lipids with LDL cholesterol goal below 70 mg percent and diabetes with hemoglobin A1c goal below 6.5%. He was also counseled to eat healthy, exercise regularly and lose weight  SIGNED Antony Contras, MD   To contact Stroke Continuity provider, please refer to [email or web address] After hours, contact General Neurology

## 2015-09-28 NOTE — Progress Notes (Signed)
PROGRESS NOTE  Brad [name] GEX:[id number] DOB: [birth year] DOA: 09/27/2015 PCP: [name], MD Brief History 60 year old male with a history of stroke in 2015, diabetes mellitus, hypertension, hyperlipidemia, continued tobacco use, carotid stenosis (L-CEA on 11/13/14) presented with new-onset gait unsteadiness and subjective dizziness, as well as left arm sensory numbness and weakness. Around 5 PM on 09/27/2015, the patient felt "like my legs would not move where I wanted them to". Gait was wobbly with a veering quality. During the hospitalization, he also felt  "the bowl slipped out of my hand". The patient was admitted for stroke workup. MRI of the brain showed an acute ischemic component in the right basal ganglia. Neurology was consulted.  Assessment/Plan: Acute nonhemorrhagic infarct -09/27/2015 MRI brain--acute ischemic infarct with basal ganglia, acute punctate L-occipital infarct  -MRA brain moderate to severe short segment stenosis of the proximal right ICA -Carotid duplex--stable 60-79% stenosis R-ICA -Echocardiogram--pending -Hemoglobin A1c-- -LDL -PT -continue ASA and plavix per neurology Carotid stenosis -consulted Dr. Bradley Ferris consult--CEA next week after outpt ENT eval Diabetes mellitus type 2 -06/09/2015 hemoglobin A1c 7.7 -Patient on monotherapy metformin at home -Hold metformin -NovoLog sliding scale -Levemir 10 units daily Hypertension -Restart amlodipine 5 mg daily -restart lisinopril -hold furosemide for right now Hyperlipidemia -Continue Zocor Hypothyroidism -Continue Synthroid -TSH 6.651 -Increase Synthroid to 125 g   Family Communication:   Wife updated at beside 09/29/15 Disposition Plan:   Home 09/29/15 after stroke workup finished    Procedures/Studies: Ct Head Wo Contrast  09/27/2015  CLINICAL DATA:  Dizziness, numbness since this morning around 5 a.m. EXAM: CT HEAD WITHOUT CONTRAST TECHNIQUE: Contiguous axial images were  obtained from the base of the skull through the vertex without intravenous contrast. COMPARISON:  09/23/2014 FINDINGS: Patchy chronic small vessel disease changes throughout the deep white matter. No acute intracranial abnormality. Specifically, no hemorrhage, hydrocephalus, mass lesion, acute infarction, or significant intracranial injury. No acute calvarial abnormality. Visualized paranasal sinuses and mastoids clear. Orbital soft tissues unremarkable. IMPRESSION: Mild chronic small vessel disease. No acute intracranial abnormality. Electronically Signed   By: Rolm Baptise M.D.   On: 09/27/2015 11:52   Mr Angiogram Neck W Wo Contrast  09/27/2015  CLINICAL DATA:  Initial evaluation for new onset dizziness with gait instability and left-sided weakness. EXAM: MRI HEAD WITHOUT CONTRAST MRA NECK WITHOUT AND WITH CONTRAST TECHNIQUE: Multiplanar, multiecho pulse sequences of the brain and surrounding structures were obtained without intravenous contrast. Angiographic images of the neck were obtained using MRA technique with and without intravenous contrast. Carotid stenosis measurements (when applicable) are obtained utilizing NASCET criteria, using the distal internal carotid diameter as the denominator. CONTRAST:  58m MULTIHANCE GADOBENATE DIMEGLUMINE 529 MG/ML IV SOLN COMPARISON:  Prior CT from earlier the same day. FINDINGS: MRI HEAD FINDINGS Diffusion-weighted imaging demonstrates patchy restricted diffusion within the posterior right lentiform nucleus extending into the right corona radiata. No associated mass-effect. Probable faint petechial hemorrhage on SWI sequence without hemorrhagic transformation. Additional punctate cortical infarct within the left occipital pole noted (series 4, image 23). Possible faint petechial hemorrhage with this as well. No other areas of infarction. Major intracranial vascular flow voids are preserved. Gray-white matter differentiation otherwise maintained. Small focus of  susceptibility artifact within the left cerebellar hemisphere noted, likely a small chronic micro hemorrhage. Mild diffuse prominence of the CSF containing spaces is compatible with generalized age-related cerebral atrophy. Patchy T2/FLAIR hyperintensity within the periventricular white matter most consistent with chronic small vessel ischemic  disease, mild in nature. Small remote lacunar infarcts within the bilateral thalami and left basal ganglia. Few small remote lacunar infarcts within the bilateral corona radiata as well. No mass lesion, midline shift, or mass effect. No hydrocephalus. No extra-axial fluid collection. Major dural sinuses appear patent. Craniocervical junction widely patent and normal in appearance. Scattered multilevel degenerative spondylolysis present within the upper cervical spine without significant stenosis. Pituitary gland within normal limits. No acute abnormality about the orbits. Paranasal sinuses are clear. Right mastoid effusion noted, likely benign. Inner ear structures grossly normal. Bone marrow signal intensity within normal limits. No scalp soft tissue abnormality. MRA NECK FINDINGS Study somewhat limited by motion artifact. Visualized aortic arch is of normal caliber with normal branch pattern. Possible moderate to severe stenosis at the origin of left common carotid artery. No other high-grade stenosis at the origin of the great vessels. Visualized subclavian arteries are well opacified. Right common carotid artery patent from its origin to the bifurcation. There is a centric plaque at the proximal right ICA with secondary moderate to severe short-segment stenosis. Right ICA patent distally to the skullbase without additional stenosis. Possible moderate to severe stenosis at the origin of the left common carotid artery, not well evaluated in this exam. Left common carotid artery otherwise patent to the bifurcation. Mild atheromatous irregularity about the left carotid  bifurcation without significant stenosis. Left ICA well opacified distally to the skullbase. Origin of the vertebral arteries not well evaluated on this exam. Vertebral arteries are patent distally with antegrade flow to the skullbase without occlusion or definite high-grade stenosis. The left vertebral artery is dominant. IMPRESSION: MRI HEAD IMPRESSION: 1. Acute ischemic infarct involving the right basal ganglia/ corona radiata without mass effect. Probable faint petechial hemorrhage without evidence for hemorrhagic transformation. 2. Additional punctate cortical infarct within the left occipital pole. Possible faint petechial hemorrhage with this infarct as well. 3. Age-related cerebral atrophy with mild chronic small vessel ischemic disease with additional scattered remote lacunar infarcts as above. MRA NECK IMPRESSION: 1. Limited study due to motion artifact. 2. Eccentric plaque at the proximal right ICA with secondary moderate to severe short-segment stenosis. Correlation with carotid Doppler ultrasound and/or CTA could be performed for further evaluation. 3. Possible moderate to severe stenosis at the origin of the left common carotid artery, poorly evaluated on this exam. 4. No other critical or flow-limiting stenosis identified within the major arterial vasculature of the neck. 5. Poor evaluation of the proximal vertebral arteries. Vertebral arteries are patent distally with antegrade flow to the skullbase. Electronically Signed   By: Jeannine Boga M.D.   On: 09/27/2015 22:12   Mr Brain Wo Contrast  09/27/2015  CLINICAL DATA:  Initial evaluation for new onset dizziness with gait instability and left-sided weakness. EXAM: MRI HEAD WITHOUT CONTRAST MRA NECK WITHOUT AND WITH CONTRAST TECHNIQUE: Multiplanar, multiecho pulse sequences of the brain and surrounding structures were obtained without intravenous contrast. Angiographic images of the neck were obtained using MRA technique with and without  intravenous contrast. Carotid stenosis measurements (when applicable) are obtained utilizing NASCET criteria, using the distal internal carotid diameter as the denominator. CONTRAST:  2m MULTIHANCE GADOBENATE DIMEGLUMINE 529 MG/ML IV SOLN COMPARISON:  Prior CT from earlier the same day. FINDINGS: MRI HEAD FINDINGS Diffusion-weighted imaging demonstrates patchy restricted diffusion within the posterior right lentiform nucleus extending into the right corona radiata. No associated mass-effect. Probable faint petechial hemorrhage on SWI sequence without hemorrhagic transformation. Additional punctate cortical infarct within the left occipital pole noted (series  4, image 23). Possible faint petechial hemorrhage with this as well. No other areas of infarction. Major intracranial vascular flow voids are preserved. Gray-white matter differentiation otherwise maintained. Small focus of susceptibility artifact within the left cerebellar hemisphere noted, likely a small chronic micro hemorrhage. Mild diffuse prominence of the CSF containing spaces is compatible with generalized age-related cerebral atrophy. Patchy T2/FLAIR hyperintensity within the periventricular white matter most consistent with chronic small vessel ischemic disease, mild in nature. Small remote lacunar infarcts within the bilateral thalami and left basal ganglia. Few small remote lacunar infarcts within the bilateral corona radiata as well. No mass lesion, midline shift, or mass effect. No hydrocephalus. No extra-axial fluid collection. Major dural sinuses appear patent. Craniocervical junction widely patent and normal in appearance. Scattered multilevel degenerative spondylolysis present within the upper cervical spine without significant stenosis. Pituitary gland within normal limits. No acute abnormality about the orbits. Paranasal sinuses are clear. Right mastoid effusion noted, likely benign. Inner ear structures grossly normal. Bone marrow signal  intensity within normal limits. No scalp soft tissue abnormality. MRA NECK FINDINGS Study somewhat limited by motion artifact. Visualized aortic arch is of normal caliber with normal branch pattern. Possible moderate to severe stenosis at the origin of left common carotid artery. No other high-grade stenosis at the origin of the great vessels. Visualized subclavian arteries are well opacified. Right common carotid artery patent from its origin to the bifurcation. There is a centric plaque at the proximal right ICA with secondary moderate to severe short-segment stenosis. Right ICA patent distally to the skullbase without additional stenosis. Possible moderate to severe stenosis at the origin of the left common carotid artery, not well evaluated in this exam. Left common carotid artery otherwise patent to the bifurcation. Mild atheromatous irregularity about the left carotid bifurcation without significant stenosis. Left ICA well opacified distally to the skullbase. Origin of the vertebral arteries not well evaluated on this exam. Vertebral arteries are patent distally with antegrade flow to the skullbase without occlusion or definite high-grade stenosis. The left vertebral artery is dominant. IMPRESSION: MRI HEAD IMPRESSION: 1. Acute ischemic infarct involving the right basal ganglia/ corona radiata without mass effect. Probable faint petechial hemorrhage without evidence for hemorrhagic transformation. 2. Additional punctate cortical infarct within the left occipital pole. Possible faint petechial hemorrhage with this infarct as well. 3. Age-related cerebral atrophy with mild chronic small vessel ischemic disease with additional scattered remote lacunar infarcts as above. MRA NECK IMPRESSION: 1. Limited study due to motion artifact. 2. Eccentric plaque at the proximal right ICA with secondary moderate to severe short-segment stenosis. Correlation with carotid Doppler ultrasound and/or CTA could be performed for  further evaluation. 3. Possible moderate to severe stenosis at the origin of the left common carotid artery, poorly evaluated on this exam. 4. No other critical or flow-limiting stenosis identified within the major arterial vasculature of the neck. 5. Poor evaluation of the proximal vertebral arteries. Vertebral arteries are patent distally with antegrade flow to the skullbase. Electronically Signed   By: Jeannine Boga M.D.   On: 09/27/2015 22:12         Subjective: Patient denies fevers, chills, headache, chest pain, dyspnea, nausea, vomiting, diarrhea, abdominal pain, dysuria, hematuria   Objective: Filed Vitals:   09/28/15 0530 09/28/15 0952 09/28/15 1412 09/28/15 1730  BP: 211/88 180/75 192/73 194/85  Pulse: 67 66 64 73  Temp: 97.9 F (36.6 C) 98.5 F (36.9 C) 97.4 F (36.3 C) 98.2 F (36.8 C)  TempSrc: Oral Oral Oral Oral  Resp: '18 18 18 18  '$ Height:      Weight:      SpO2: 99% 100% 100% 100%    Intake/Output Summary (Last 24 hours) at 09/28/15 1830 Last data filed at 09/28/15 1600  Gross per 24 hour  Intake   1200 ml  Output    950 ml  Net    250 ml   Weight change:  Exam:   General:  Pt is alert, follows commands appropriately, not in acute distress  HEENT: No icterus, No thrush, No neck mass, [place]/AT  Cardiovascular: RRR, S1/S2, no rubs, no gallops  Respiratory: C diminished breath sounds but CTA bilaterally, no wheezing, no crackles, no rhonchi  Abdomen: Soft/+BS, non tender, non distended, no guarding. No hepatosplenomegaly;  Extremities: 1+LE edema, No lymphangitis, No petechiae, No rashes, no synovitis; no cyanosis  Data Reviewed: Basic Metabolic Panel:  Recent Labs Lab 09/27/15 1015 09/27/15 1030 09/28/15 0605  NA 138 139 138  K 4.1 4.1 3.8  CL 102 98* 103  CO2 25  --  28  GLUCOSE 196* 196* 159*  BUN '12 13 15  '$ CREATININE 1.16 1.20 1.21  CALCIUM 8.5*  --  8.6*   Liver Function Tests:  Recent Labs Lab 09/27/15 1015  AST 43*    ALT 30  ALKPHOS 131*  BILITOT 0.3  PROT 6.0*  ALBUMIN 2.1*   No results for input(s): LIPASE, AMYLASE in the last 168 hours. No results for input(s): AMMONIA in the last 168 hours. CBC:  Recent Labs Lab 09/27/15 1015 09/27/15 1030 09/28/15 0605  WBC 5.0  --  5.7  NEUTROABS 2.9  --   --   HGB 13.0 13.9 13.9  HCT 39.8 41.0 41.6  MCV 80.6  --  80.3  PLT 191  --  223   Cardiac Enzymes: No results for input(s): CKTOTAL, CKMB, CKMBINDEX, TROPONINI in the last 168 hours. BNP: Invalid input(s): POCBNP CBG:  Recent Labs Lab 09/27/15 1047 09/27/15 2202 09/28/15 0656 09/28/15 1127 09/28/15 1643  GLUCAP 177* 211* 160* 125* 114*    Recent Results (from the past 240 hour(s))  MRSA PCR Screening     Status: None   Collection Time: 09/27/15 10:07 PM  Result Value Ref Range Status   MRSA by PCR NEGATIVE NEGATIVE Final    Comment:        The GeneXpert MRSA Assay (FDA approved for NASAL specimens only), is one component of a comprehensive MRSA colonization surveillance program. It is not intended to diagnose MRSA infection nor to guide or monitor treatment for MRSA infections.      Scheduled Meds: . aspirin EC  325 mg Oral Daily  . clopidogrel  75 mg Oral Daily  . furosemide  40 mg Oral Daily  . heparin  5,000 Units Subcutaneous 3 times per day  . insulin aspart  0-15 Units Subcutaneous TID WC  . insulin aspart  0-5 Units Subcutaneous QHS  . insulin aspart  3 Units Subcutaneous TID WC  . insulin detemir  10 Units Subcutaneous BID  . levothyroxine  112 mcg Oral QAC breakfast  . simvastatin  40 mg Oral QHS  . sodium chloride flush  3 mL Intravenous Q12H   Continuous Infusions: . sodium chloride       Olive Zmuda, DO  [facility] Pager [phone]  If 7PM-7AM, please contact night-coverage [email or web address] Password [email or web address] 09/28/2015, 6:30 PM   LOS: 0 days

## 2015-09-28 NOTE — Care Management Note (Signed)
Case Management Note  Patient Details  Name: Brad [name] MRN: [id number] Date of Birth: 09/21/1955  Subjective/Objective:                    Action/Plan: Patient was admitted with CVA. Will follow for discharge needs pending PT/OT evals and physician orders.   Expected Discharge Date:   (Pending)               Expected Discharge Plan:     In-House Referral:     Discharge planning Services     Post Acute Care Choice:    Choice offered to:     DME Arranged:    DME Agency:     HH Arranged:    HH Agency:     Status of Service:  In process, will continue to follow  Medicare Important Message Given:    Date Medicare IM Given:    Medicare IM give by:    Date Additional Medicare IM Given:    Additional Medicare Important Message give by:     If discussed at [place] of Stay Meetings, dates discussed:    Additional Comments:  Rolm Baptise, RN 09/28/2015, 10:43 AM [phone]

## 2015-09-28 NOTE — Progress Notes (Signed)
Preliminary results by tech - Carotid Duplex Completed. Right ICA - stable 60-79% stenosis. Left CEA, no evidence of restenosis noted. Bilateral vertebral arteries demonstrate antegrade flow. Oda Cogan, BS, RDMS, RVT

## 2015-09-29 ENCOUNTER — Other Ambulatory Visit: Payer: Self-pay | Admitting: *Deleted

## 2015-09-29 ENCOUNTER — Ambulatory Visit (HOSPITAL_COMMUNITY): Payer: Medicare Other

## 2015-09-29 ENCOUNTER — Encounter (HOSPITAL_COMMUNITY): Payer: Medicare Other

## 2015-09-29 ENCOUNTER — Inpatient Hospital Stay (HOSPITAL_COMMUNITY): Payer: Medicare Other

## 2015-09-29 ENCOUNTER — Telehealth: Payer: Self-pay | Admitting: Surgery

## 2015-09-29 DIAGNOSIS — I6789 Other cerebrovascular disease: Secondary | ICD-10-CM

## 2015-09-29 DIAGNOSIS — Z01818 Encounter for other preprocedural examination: Secondary | ICD-10-CM

## 2015-09-29 DIAGNOSIS — I639 Cerebral infarction, unspecified: Secondary | ICD-10-CM

## 2015-09-29 DIAGNOSIS — I6523 Occlusion and stenosis of bilateral carotid arteries: Secondary | ICD-10-CM

## 2015-09-29 LAB — GLUCOSE, CAPILLARY
Glucose-Capillary: 153 mg/dL — ABNORMAL HIGH (ref 65–99)
Glucose-Capillary: 166 mg/dL — ABNORMAL HIGH (ref 65–99)

## 2015-09-29 LAB — BASIC METABOLIC PANEL
Anion gap: 6 (ref 5–15)
BUN: 19 mg/dL (ref 6–20)
CO2: 30 mmol/L (ref 22–32)
Calcium: 8.7 mg/dL — ABNORMAL LOW (ref 8.9–10.3)
Chloride: 104 mmol/L (ref 101–111)
Creatinine, Ser: 1.18 mg/dL (ref 0.61–1.24)
GFR calc Af Amer: >60 mL/min (ref >60)
GFR calc non Af Amer: >60 mL/min (ref >60)
Glucose, Bld: 155 mg/dL — ABNORMAL HIGH (ref 65–99)
Potassium: 4.4 mmol/L (ref 3.5–5.1)
Sodium: 140 mmol/L (ref 135–145)

## 2015-09-29 LAB — LIPID PANEL
Cholesterol: 182 mg/dL (ref 0–200)
HDL: 24 mg/dL — ABNORMAL LOW (ref >40)
LDL Cholesterol: 86 mg/dL (ref 0–99)
Total CHOL/HDL Ratio: 7.6 RATIO
Triglycerides: 359 mg/dL — ABNORMAL HIGH (ref <150)
VLDL: 72 mg/dL — ABNORMAL HIGH (ref 0–40)

## 2015-09-29 LAB — HEMOGLOBIN A1C
Hgb A1c MFr Bld: 7.5 % — ABNORMAL HIGH (ref 4.8–5.6)
Mean Plasma Glucose: 169 mg/dL

## 2015-09-29 NOTE — Evaluation (Signed)
Occupational Therapy Evaluation Patient Details Name: Brad [name] MRN: [id number] DOB: [birth year] Today's Date: 09/29/2015    History of Present Illness Pt is a 60 y/o male who presents with LLE weakness, L arm numbness, and L hand weakness. MRI revealed acute ischemic infarct to R basal ganglia/corona radiata.    Clinical Impression   Pt slightly agitated upon arrival; reports that he is just ready to leave and go home. Pt reports he was independent with ADLs and mobility PTA. Currently pt is overall mod I with ADLs and functional mobility with the exception of supervision for tub transfers. Minor LOB noted with simulated tub transfer; recommended supervision with tub transfers and sitting to perform bathing. All education complete; pt verbalized understanding. Pt planning to d/c home with 24/7 supervision from his finace. No acute OT needs identified; signing off at this time. Thank you for this referral.     Follow Up Recommendations  No OT follow up;Supervision - Intermittent    Equipment Recommendations  None recommended by OT    Recommendations for Other Services       Precautions / Restrictions Precautions Precautions: Fall Precaution Comments: Pt reports back pain that started ~3 days ago. Restrictions Weight Bearing Restrictions: No      Mobility Bed Mobility               General bed mobility comments: Pt OOB in chair upon arrival.  Transfers Overall transfer level: Modified independent Equipment used: None Transfers: Sit to/from Stand Sit to Stand: Modified independent (Device/Increase time)              Balance Overall balance assessment: Needs assistance         Standing balance support: No upper extremity supported;During functional activity Standing balance-Leahy Scale: Good Standing balance comment: Minor LOB noted with simulated tub transfer.                            ADL Overall ADL's : Needs  assistance/impaired Eating/Feeding: Modified independent;Sitting   Grooming: Modified independent;Standing   Upper Body Bathing: Modified independent;Sitting   Lower Body Bathing: Modified independent;Sit to/from stand   Upper Body Dressing : Modified independent;Sitting   Lower Body Dressing: Modified independent;Sit to/from stand   Toilet Transfer: Modified Independent;Ambulation;Regular Glass blower/designer Details (indicate cue type and reason): Simulated by sit to stand from chair Toileting- Clothing Manipulation and Hygiene: Modified independent   Tub/ Shower Transfer: Supervision/safety;Tub transfer;Ambulation;Shower Scientist, research (medical) Details (indicate cue type and reason): Pt with minor LOB with simulated tub transfer. Discussed need for supervision with transfer; pt verbalized understanding. Functional mobility during ADLs: Modified independent General ADL Comments: Educated on use of shower chair for safety with bathing, supervision for tub transfer, sitting for LB ADLs, home safety; pt verbalized understanding.     Vision Vision Assessment?: No apparent visual deficits   Perception     Praxis      Pertinent Vitals/Pain Pain Assessment: Faces Faces Pain Scale: Hurts little more Pain Location: back Pain Descriptors / Indicators: Aching Pain Intervention(s): Limited activity within patient's tolerance;Monitored during session;Premedicated before session     Hand Dominance Right   Extremity/Trunk Assessment Upper Extremity Assessment Upper Extremity Assessment: RUE deficits/detail;LUE deficits/detail RUE Deficits / Details: Prior injury and surgery resulting in baseline AROM and strength deficits.  LUE Deficits / Details: Pt reports a "tear" in his shoulder that he is waiting to have surgery on (pt reports he wants to get surgery next  year).    Lower Extremity Assessment Lower Extremity Assessment: Defer to PT evaluation   Cervical / Trunk  Assessment Cervical / Trunk Assessment: Other exceptions Cervical / Trunk Exceptions: Generally flexed trunk. Pt states this is from back pain.    Communication Communication Communication: No difficulties   Cognition Arousal/Alertness: Awake/alert Behavior During Therapy: Impulsive Overall Cognitive Status: Within Functional Limits for tasks assessed                     General Comments       Exercises       Shoulder Instructions      Home Living Family/patient expects to be discharged to:: Private residence Living Arrangements: Spouse/significant other Available Help at Discharge: Family;Available 24 hours/day Type of Home: House Home Access: Stairs to enter [employer] of Steps: 2   Home Layout: One level     Bathroom Shower/Tub: Tub/shower unit Shower/tub characteristics: Curtain Biochemist, clinical: Handicapped height     Home Equipment: Shower seat;Crutches;Grab bars - tub/shower          Prior Functioning/Environment Level of Independence: Independent             OT Diagnosis: Generalized weakness;Acute pain   OT Problem List:     OT Treatment/Interventions:      OT Goals(Current goals can be found in the care plan section) Acute Rehab OT Goals Patient Stated Goal: home ASAP OT Goal Formulation: All assessment and education complete, DC therapy  OT Frequency:     Barriers to D/C:            Co-evaluation              End of Session Nurse Communication: Mobility status  Activity Tolerance: Patient tolerated treatment well Patient left: in chair;with call bell/phone within reach   Time: 1210-1221 OT Time Calculation (min): 11 min Charges:  OT General Charges $OT Visit: 1 Procedure OT Evaluation $OT Eval Low Complexity: 1 Procedure G-Codes:     Binnie Kand M.S., OTR/L Pager: [phone]  09/29/2015, 12:36 PM

## 2015-09-29 NOTE — Consult Note (Signed)
   [facility] CM Inpatient Consult   09/29/2015  [name] 1955-09-02 [id number]   Patient active with [facility] Management services. Made aware of admit by [facility]. Please see chart review tab then notes in EPIC for [facility] patient outreach correspondence with Mr. Zeimet. Met with patient at bedside to make aware that [facility] RNCM will follow up post hospital discharge. He remains agreeable to [facility] services. Written consent obtained. He reports he is ready to leave and plan is for discharge today. Will alert [facility] RNCM of discharge today. [facility] Care Management information left with patient. Will make inpatient RNCM aware [facility] is following.   Marthenia Rolling, MSN-Ed, RN,BSN [facility] Liaison [phone]

## 2015-09-29 NOTE — Progress Notes (Signed)
Patient left AMA, nurse advised the patient of the risk associated with leaving. Patient stated "death will be better than sitting here all day". Patient leftt floor at 1756.

## 2015-09-29 NOTE — Progress Notes (Signed)
Patient left the floor with wife and said that he will come back.  Dr Algis Liming is aware this and security notified.

## 2015-09-29 NOTE — Telephone Encounter (Signed)
Patient was still in the hospital at the time of my call- called [facility]M at [facility] and spoke with patient to relay information- however he was unable to take the information. Will try to call again tomorrow.

## 2015-09-29 NOTE — Telephone Encounter (Signed)
-----   Message from Mena Goes, RN sent at 09/29/2015  9:19 AM EST ----- Regarding: schedule   ----- Message -----    From: Serafina Mitchell, MD    Sent: 09/28/2015   4:53 PM      To: [facility]vs Charge Pool  09/28/2015: Level V consult for right carotid stenosis.  The patient is being discharged home today.  He will need vocal cord evaluation by ear nose and throat and then to be scheduled for a right carotid endarterectomy next week

## 2015-09-29 NOTE — Progress Notes (Signed)
The plan is for revascularization as outlined in Dr. Clydene Fake note. The stroke team will sign off at this time. Please call if we can be of further service. Neurology follow-up with Dr. Leonie Man in 2 months.  Mikey Bussing PA-C [facility] Pager [phone] 09/29/2015, 10:14 AM

## 2015-09-29 NOTE — Progress Notes (Signed)
Patient  and wife came back to the floor and back to his room.  Patient claimed that he felt so much better after his walk off the floor.  Patient is cooperative and calm at this moment.  RN explained to the patient that he is not allow to leave floor again.  Patient is agreeable on this. Heart monitored replaced.  Dr Algis Liming notified by text.

## 2015-09-29 NOTE — Progress Notes (Signed)
*  PRELIMINARY RESULTS* Echocardiogram 2D Echocardiogram has been performed.  Leavy Cella 09/29/2015, 2:39 PM

## 2015-09-29 NOTE — Progress Notes (Signed)
Stroke Team Progress Note  HISTORY Brad [name] is an 60 y.o. male who presents with a chief complaint of new-onset gait unsteadiness and subjective dizziness, as well as left arm sensory numbness and weakness. He states that he was last normal at about 5 PM yesterday, when began experiencing gait unsteadiness "like my legs would not move where I wanted them to". Gait was wobbly with a veering quality, but no definite tendency to fall preferentially to any given side. Also had left hand weakness noticed this AM while eating breakfast - "the bowl slipped out of my hand". At baseline he has left sided weakness secondary to a "brainstem stroke" about one year ago. At that time a brain scan had shown "a large old stroke on the left side of my head" that per patient was never symptomatic. He has a history of left CEA.   Endorses mild headache and leg pain. Also with some blurring of vision in temporal visual field of right eye. Denies chest or abdominal pain.   LSN: Approximately 5 PM Sunday.  tPA Given: Not a tPA candidate based upon time criteria Patient was not administered TPA secondary to delay in arrival. He was admitted to the neuro floor bed for further evaluation and treatment.  SUBJECTIVE His family  Is not at the bedside.  Overall he feels his condition is stable.  He has known history of right carotid stenosis which is moderate and being followed conservatively by vascular surgery. He has history of left carotid surgery in the past  OBJECTIVE Most recent Vital Signs: Filed Vitals:   09/29/15 0116 09/29/15 0515 09/29/15 0935 09/29/15 1448  BP: 152/71 173/70 159/70 162/78  Pulse: 75 69 65 57  Temp: 97.9 F (36.6 C) 98.2 F (36.8 C) 98 F (36.7 C) 98.6 F (37 C)  TempSrc: Oral Oral Oral Oral  Resp: '18 20 20 20  '$ Height:      Weight:      SpO2: 98% 100% 96% 98%   CBG (last 3)   Recent Labs  09/28/15 2303 09/29/15 0621 09/29/15 1104  GLUCAP 135* 153* 166*    IV Fluid  Intake:   . sodium chloride      MEDICATIONS  . amLODipine  5 mg Oral Daily  . aspirin EC  325 mg Oral Daily  . clopidogrel  75 mg Oral Daily  . furosemide  40 mg Oral Daily  . heparin  5,000 Units Subcutaneous 3 times per day  . insulin aspart  0-15 Units Subcutaneous TID WC  . insulin aspart  0-5 Units Subcutaneous QHS  . insulin detemir  10 Units Subcutaneous Daily  . levothyroxine  125 mcg Oral QAC breakfast  . lisinopril  20 mg Oral Daily  . simvastatin  40 mg Oral QHS  . sodium chloride flush  3 mL Intravenous Q12H   PRN:  acetaminophen **OR** acetaminophen, albuterol, ondansetron **OR** ondansetron (ZOFRAN) IV, polyethylene glycol, tiZANidine  Diet:  Diet heart healthy/carb modified Room service appropriate?: Yes; Fluid consistency:: Thin   Activity:  Up with assistance  DVT Prophylaxis:  SCDs CLINICALLY SIGNIFICANT STUDIES Basic Metabolic Panel:   Recent Labs Lab 09/28/15 0605 09/29/15 0530  NA 138 140  K 3.8 4.4  CL 103 104  CO2 28 30  GLUCOSE 159* 155*  BUN 15 19  CREATININE 1.21 1.18  CALCIUM 8.6* 8.7*   Liver Function Tests:   Recent Labs Lab 09/27/15 1015  AST 43*  ALT 30  ALKPHOS 131*  BILITOT 0.3  PROT 6.0*  ALBUMIN 2.1*   CBC:   Recent Labs Lab 09/27/15 1015 09/27/15 1030 09/28/15 0605  WBC 5.0  --  5.7  NEUTROABS 2.9  --   --   HGB 13.0 13.9 13.9  HCT 39.8 41.0 41.6  MCV 80.6  --  80.3  PLT 191  --  223   Coagulation:   Recent Labs Lab 09/27/15 1015  LABPROT 14.5  INR 1.11   Cardiac Enzymes: No results for input(s): CKTOTAL, CKMB, CKMBINDEX, TROPONINI in the last 168 hours. Urinalysis: No results for input(s): COLORURINE, LABSPEC, PHURINE, GLUCOSEU, HGBUR, BILIRUBINUR, KETONESUR, PROTEINUR, UROBILINOGEN, NITRITE, LEUKOCYTESUR in the last 168 hours.  Invalid input(s): APPERANCEUR Lipid Panel    Component Value Date/Time   CHOL 182 09/29/2015 0530   TRIG 359* 09/29/2015 0530   HDL 24* 09/29/2015 0530   CHOLHDL 7.6  09/29/2015 0530   VLDL 72* 09/29/2015 0530   LDLCALC 86 09/29/2015 0530   HgbA1C  Lab Results  Component Value Date   HGBA1C 7.5* 09/27/2015    Urine Drug Screen:      Component Value Date/Time   LABOPIA NONE DETECTED 09/23/2014 1018   COCAINSCRNUR NONE DETECTED 09/23/2014 1018   LABBENZ NONE DETECTED 09/23/2014 1018   AMPHETMU NONE DETECTED 09/23/2014 1018   THCU NONE DETECTED 09/23/2014 1018   LABBARB NONE DETECTED 09/23/2014 1018    Alcohol Level: No results for input(s): ETH in the last 168 hours.  Mr Angiogram Neck W Wo Contrast  09/27/2015  CLINICAL DATA:  Initial evaluation for new onset dizziness with gait instability and left-sided weakness. EXAM: MRI HEAD WITHOUT CONTRAST MRA NECK WITHOUT AND WITH CONTRAST TECHNIQUE: Multiplanar, multiecho pulse sequences of the brain and surrounding structures were obtained without intravenous contrast. Angiographic images of the neck were obtained using MRA technique with and without intravenous contrast. Carotid stenosis measurements (when applicable) are obtained utilizing NASCET criteria, using the distal internal carotid diameter as the denominator. CONTRAST:  68m MULTIHANCE GADOBENATE DIMEGLUMINE 529 MG/ML IV SOLN COMPARISON:  Prior CT from earlier the same day. FINDINGS: MRI HEAD FINDINGS Diffusion-weighted imaging demonstrates patchy restricted diffusion within the posterior right lentiform nucleus extending into the right corona radiata. No associated mass-effect. Probable faint petechial hemorrhage on SWI sequence without hemorrhagic transformation. Additional punctate cortical infarct within the left occipital pole noted (series 4, image 23). Possible faint petechial hemorrhage with this as well. No other areas of infarction. Major intracranial vascular flow voids are preserved. Gray-white matter differentiation otherwise maintained. Small focus of susceptibility artifact within the left cerebellar hemisphere noted, likely a small  chronic micro hemorrhage. Mild diffuse prominence of the CSF containing spaces is compatible with generalized age-related cerebral atrophy. Patchy T2/FLAIR hyperintensity within the periventricular white matter most consistent with chronic small vessel ischemic disease, mild in nature. Small remote lacunar infarcts within the bilateral thalami and left basal ganglia. Few small remote lacunar infarcts within the bilateral corona radiata as well. No mass lesion, midline shift, or mass effect. No hydrocephalus. No extra-axial fluid collection. Major dural sinuses appear patent. Craniocervical junction widely patent and normal in appearance. Scattered multilevel degenerative spondylolysis present within the upper cervical spine without significant stenosis. Pituitary gland within normal limits. No acute abnormality about the orbits. Paranasal sinuses are clear. Right mastoid effusion noted, likely benign. Inner ear structures grossly normal. Bone marrow signal intensity within normal limits. No scalp soft tissue abnormality. MRA NECK FINDINGS Study somewhat limited by motion artifact. Visualized aortic arch is of normal caliber with normal branch  pattern. Possible moderate to severe stenosis at the origin of left common carotid artery. No other high-grade stenosis at the origin of the great vessels. Visualized subclavian arteries are well opacified. Right common carotid artery patent from its origin to the bifurcation. There is a centric plaque at the proximal right ICA with secondary moderate to severe short-segment stenosis. Right ICA patent distally to the skullbase without additional stenosis. Possible moderate to severe stenosis at the origin of the left common carotid artery, not well evaluated in this exam. Left common carotid artery otherwise patent to the bifurcation. Mild atheromatous irregularity about the left carotid bifurcation without significant stenosis. Left ICA well opacified distally to the  skullbase. Origin of the vertebral arteries not well evaluated on this exam. Vertebral arteries are patent distally with antegrade flow to the skullbase without occlusion or definite high-grade stenosis. The left vertebral artery is dominant. IMPRESSION: MRI HEAD IMPRESSION: 1. Acute ischemic infarct involving the right basal ganglia/ corona radiata without mass effect. Probable faint petechial hemorrhage without evidence for hemorrhagic transformation. 2. Additional punctate cortical infarct within the left occipital pole. Possible faint petechial hemorrhage with this infarct as well. 3. Age-related cerebral atrophy with mild chronic small vessel ischemic disease with additional scattered remote lacunar infarcts as above. MRA NECK IMPRESSION: 1. Limited study due to motion artifact. 2. Eccentric plaque at the proximal right ICA with secondary moderate to severe short-segment stenosis. Correlation with carotid Doppler ultrasound and/or CTA could be performed for further evaluation. 3. Possible moderate to severe stenosis at the origin of the left common carotid artery, poorly evaluated on this exam. 4. No other critical or flow-limiting stenosis identified within the major arterial vasculature of the neck. 5. Poor evaluation of the proximal vertebral arteries. Vertebral arteries are patent distally with antegrade flow to the skullbase. Electronically Signed   By: Jeannine Boga M.D.   On: 09/27/2015 22:12   Mr Brain Wo Contrast  09/27/2015  CLINICAL DATA:  Initial evaluation for new onset dizziness with gait instability and left-sided weakness. EXAM: MRI HEAD WITHOUT CONTRAST MRA NECK WITHOUT AND WITH CONTRAST TECHNIQUE: Multiplanar, multiecho pulse sequences of the brain and surrounding structures were obtained without intravenous contrast. Angiographic images of the neck were obtained using MRA technique with and without intravenous contrast. Carotid stenosis measurements (when applicable) are obtained  utilizing NASCET criteria, using the distal internal carotid diameter as the denominator. CONTRAST:  35m MULTIHANCE GADOBENATE DIMEGLUMINE 529 MG/ML IV SOLN COMPARISON:  Prior CT from earlier the same day. FINDINGS: MRI HEAD FINDINGS Diffusion-weighted imaging demonstrates patchy restricted diffusion within the posterior right lentiform nucleus extending into the right corona radiata. No associated mass-effect. Probable faint petechial hemorrhage on SWI sequence without hemorrhagic transformation. Additional punctate cortical infarct within the left occipital pole noted (series 4, image 23). Possible faint petechial hemorrhage with this as well. No other areas of infarction. Major intracranial vascular flow voids are preserved. Gray-white matter differentiation otherwise maintained. Small focus of susceptibility artifact within the left cerebellar hemisphere noted, likely a small chronic micro hemorrhage. Mild diffuse prominence of the CSF containing spaces is compatible with generalized age-related cerebral atrophy. Patchy T2/FLAIR hyperintensity within the periventricular white matter most consistent with chronic small vessel ischemic disease, mild in nature. Small remote lacunar infarcts within the bilateral thalami and left basal ganglia. Few small remote lacunar infarcts within the bilateral corona radiata as well. No mass lesion, midline shift, or mass effect. No hydrocephalus. No extra-axial fluid collection. Major dural sinuses appear patent. Craniocervical junction widely  patent and normal in appearance. Scattered multilevel degenerative spondylolysis present within the upper cervical spine without significant stenosis. Pituitary gland within normal limits. No acute abnormality about the orbits. Paranasal sinuses are clear. Right mastoid effusion noted, likely benign. Inner ear structures grossly normal. Bone marrow signal intensity within normal limits. No scalp soft tissue abnormality. MRA NECK FINDINGS  Study somewhat limited by motion artifact. Visualized aortic arch is of normal caliber with normal branch pattern. Possible moderate to severe stenosis at the origin of left common carotid artery. No other high-grade stenosis at the origin of the great vessels. Visualized subclavian arteries are well opacified. Right common carotid artery patent from its origin to the bifurcation. There is a centric plaque at the proximal right ICA with secondary moderate to severe short-segment stenosis. Right ICA patent distally to the skullbase without additional stenosis. Possible moderate to severe stenosis at the origin of the left common carotid artery, not well evaluated in this exam. Left common carotid artery otherwise patent to the bifurcation. Mild atheromatous irregularity about the left carotid bifurcation without significant stenosis. Left ICA well opacified distally to the skullbase. Origin of the vertebral arteries not well evaluated on this exam. Vertebral arteries are patent distally with antegrade flow to the skullbase without occlusion or definite high-grade stenosis. The left vertebral artery is dominant. IMPRESSION: MRI HEAD IMPRESSION: 1. Acute ischemic infarct involving the right basal ganglia/ corona radiata without mass effect. Probable faint petechial hemorrhage without evidence for hemorrhagic transformation. 2. Additional punctate cortical infarct within the left occipital pole. Possible faint petechial hemorrhage with this infarct as well. 3. Age-related cerebral atrophy with mild chronic small vessel ischemic disease with additional scattered remote lacunar infarcts as above. MRA NECK IMPRESSION: 1. Limited study due to motion artifact. 2. Eccentric plaque at the proximal right ICA with secondary moderate to severe short-segment stenosis. Correlation with carotid Doppler ultrasound and/or CTA could be performed for further evaluation. 3. Possible moderate to severe stenosis at the origin of the left  common carotid artery, poorly evaluated on this exam. 4. No other critical or flow-limiting stenosis identified within the major arterial vasculature of the neck. 5. Poor evaluation of the proximal vertebral arteries. Vertebral arteries are patent distally with antegrade flow to the skullbase. Electronically Signed   By: Jeannine Boga M.D.   On: 09/27/2015 22:12       Carotid Doppler  60-79 % RICA stenosis  2D Echocardiogram  pending    EKG  Sinus rhythm Incomplete right bundle branch block LVH with secondary repolarization abnormality  Therapy Recommendations  None  Exam stable 09/29/2015  Physical Exam  Obese middle aged male not in distress. . Afebrile. Head is nontraumatic. Neck is supple without bruit.    Cardiac exam no murmur or gallop. Lungs are clear to auscultation. Distal pulses are well felt. Scar from prior left carotid endarterectomy on the left side in the neck Neurological Exam ;  Awake  Alert oriented x 3. Normal speech and language.eye movements full without nystagmus.fundi were not visualized. Vision acuity and fields appear normal. Hearing is normal. Palatal movements are normal. Face symmetric. Tongue midline. Normal strength, tone, reflexes and coordination. Normal sensation. Gait deferred.    ASSESSMENT [name] is a 60 y.o. male presenting with transient left-sided weakness from right basal ganglia infarct likely symptomatic from moderate proximal right carotid stenosis..  On aspirin 325 mg daily prior to admission. Now on aspirin 325 mg daily and clopidogrel 75 mg daily for secondary stroke prevention.  Patient without resultant  deficits. Stroke work up underway, pending echocardiogram which has been completed but not read.    Diabtees  LDL 86 goal LDL < 70  HgbA1c 7.5 goal < 6.5  Obesity   Hospital day # 1  TREATMENT/PLAN  Continue plavix and Simvastatin for secondary stroke prevention.  Agree with vascular surgery consult and  early revascularization of the right carotid in 1-2 weeks  He will need vocal cord evaluation by ear nose and throat and then to be scheduled for a right carotid endarterectomy next week  Patient remains at risk for recurrent stroke, TIA, neurological worsening and was counseled on need to quit smoking and aggressive risk factor modification with tight control of blood pressure with goal below 130/90, lipids with LDL cholesterol goal below 70 mg percent and diabetes with hemoglobin A1c goal below 6.5%. He was also counseled to eat healthy, exercise regularly and lose weight   Personally examined patient and images, and have participated in and made any corrections needed to history, physical, neuro exam,assessment and plan as stated above.  I have personally obtained the history, evaluated lab date, reviewed imaging studies and agree with radiology interpretations. He will need vocal cord evaluation by ear nose and throat and then to be scheduled for a right carotid endarterectomy next week.   Sarina Ill, MD Stroke Neurology [phone] [facility]   To contact Stroke Continuity provider, please refer to [email or web address] After hours, contact General Neurology

## 2015-09-29 NOTE — Evaluation (Signed)
SLP Cancellation Note  Patient Details Name: [name] MRN: [id number] DOB: [birth year]   Cancelled treatment:       Reason Eval/Treat Not Completed: Other (comment) (pt left floor without permission, nurse contacted physician re: this incident, will continue efforts )   Luanna Salk, [facility] [place] SLP [phone]

## 2015-09-29 NOTE — Progress Notes (Signed)
Patient get agitated and mad of being unable to leave the floor and go outside.  Took his heart monitor off.  Threatened to leave AMA.  RN explained about the consequences of leaving AMA.  Dr. Algis Liming paged and texted.

## 2015-09-29 NOTE — Discharge Summary (Signed)
Physician Discharge Summary  Brad [name]  CVE:[id number]  DOB: 05/01/1956  DOA: 09/27/2015  PCP: [name], MD   Patient left the [facility].  Admit date: 09/27/2015 Discharge date: 09/29/2015  Time spent: Less than 30 minutes  Recommendations for Outpatient Follow-up:  1. Patient left AMA without waiting for formal discharge process, paperwork or prescriptions. I called patient 48 hours post discharge and advised him to follow-up with his PCP ASAP related to abnormal echocardiogram and need for further evaluation and to obtain updated medications. I also advised him to discuss these findings with his vascular surgeon who plans to evaluate and operate on him. He verbalized understanding.   Discharge Diagnoses:  Active Problems:   Hypertensive heart disease   Type 2 diabetes mellitus with other circulatory complications (HCC)   Hyperlipidemia   Bilateral carotid artery stenosis   Essential hypertension   HTN (hypertension)   Stroke (cerebrum) (HCC)   Facial droop   Acute CVA (cerebrovascular accident) [name])   Discharge Condition: Guarded  Diet recommendation: None was made since patient had left AMA  Filed Weights   09/27/15 1820  Weight: 119.659 kg (263 lb 12.8 oz)    History of present illness:  60 year old male with a history of stroke in 2015, diabetes mellitus, hypertension, hyperlipidemia, continued tobacco use, carotid stenosis (L-CEA on 11/13/14) presented with new-onset gait unsteadiness and subjective dizziness, as well as left arm sensory numbness and weakness. Around 5 PM on 09/27/2015, the patient felt "like my legs would not move where I wanted them to". Gait was wobbly with a veering quality. During the hospitalization, he also felt "the bowl slipped out of my hand". The patient was admitted for stroke workup. MRI of the brain showed an acute ischemic component in the right basal ganglia. Neurology was consulted.  Hospital Course:    Acute nonhemorrhagic infarct - Patient presented with transient left-sided weakness from right basal ganglia infarct likely symptomatic from moderate proximal right carotid stenosis -09/27/2015 MRI brain--acute ischemic infarct with basal ganglia, acute punctate L-occipital infarct  -MRA brain moderate to severe short segment stenosis of the proximal right ICA -Carotid duplex--stable 60-79% stenosis R-ICA -Echocardiogram--abnormal and results as below. -Hemoglobin A1c--7.5, TSH 6.651 -LDL 86 -PT and OT evaluated and did not have any recommendations at discharge. -Patient was on aspirin 325 MG daily prior to admission and as per neurology, continue ASA and Plavix for secondary stroke prevention  Carotid stenosis -consulted Dr. Bradley Ferris consult--CEA next week after outpt ENT eval. Patient was advised to discuss his abnormal echo results with Dr. Trula Slade.   Diabetes mellitus type 2 -hemoglobin A1c: 7.5  -Patient on monotherapy metformin at home - Treated in the hospital with NovoLog sliding scale & Levemir 10 units daily  Hypertension - Continue amlodipine and lisinopril in the hospital.   Hyperlipidemia -Continue Zocor. Dose needed to be increased.   Hypothyroidism -Continue Synthroid -TSH 6.651 - Dose needed to be increased.  Abnormal echo/cardiomyopathy (LVEF 45-50 percent) - Echo results as below. Advised patient to follow-up with PCP for further evaluation.  Consultations:  Neurology  Procedures:  2-D echo 09/29/15: Study Conclusions  - Left ventricle: The apex is not adequately visualized to rule out apical thrombus. The cavity size was normal. There was mild concentric hypertrophy. Systolic function was mildly reduced. The estimated ejection fraction was in the range of 45% to 50%. Images were inadequate for LV wall motion assessment. Features are consistent with a pseudonormal left ventricular filling pattern, with concomitant abnormal  relaxation and increased filling pressure (grade 2 diastolic dysfunction). Doppler parameters are consistent with high ventricular filling pressure. - Aortic valve: Trileaflet; normal thickness, mildly calcified leaflets. - Mitral valve: Calcified annulus. There was trivial regurgitation. - Left atrium: The atrium was mildly dilated. - Recommendations: Recommend definity contrast study to rule out apical thrombus. Consider transesophageal echocardiography if clinically indicated in order to exclude intracardiac thrombus.  Impressions:  - No cardiac source of emboli was indentified.  Recommendations:  1. Recommend definity contrast study to rule out apical thrombus. 2. Consider transesophageal echocardiography if clinically  indicated in order to exclude intracardiac thrombus.   Discharge Exam:  Complaints:  Patient seen in the morning. He had no complaints and was anxious to leave. No strokelike symptoms reported. As per RN, patient had left the floor threatening to leave AMA but returned. Discussed at length with patient and advised him that his stroke workup was not completed (echo pending). Advised him to stay until complete workup so that appropriate recommendations and new/changed medications can be provided to patient. Multiple attempts made by me to obtain echo results ASAP without success. Patient then left AMA and M.D. was made aware after he had left.  Filed Vitals:   09/29/15 0116 09/29/15 0515 09/29/15 0935 09/29/15 1448  BP: 152/71 173/70 159/70 162/78  Pulse: 75 69 65 57  Temp: 97.9 F (36.6 C) 98.2 F (36.8 C) 98 F (36.7 C) 98.6 F (37 C)  TempSrc: Oral Oral Oral Oral  Resp: '18 20 20 20  '$ Height:      Weight:      SpO2: 98% 100% 96% 98%    General exam : pleasant middle-aged male, moderately built and overweight, sitting up comfortably in bed. Respiratory system: Clear. No increased work of breathing. Cardiovascular system: S1 & S2 heard, RRR.  No JVD, murmurs, gallops, clicks or pedal edema. Gastrointestinal system: Abdomen is nondistended, soft and nontender. Normal bowel sounds heard. Central nervous system: Alert and oriented. No focal neurological deficits. Extremities: Symmetric 5 x 5 power.  Discharge Instructions      Discharge Instructions    Ambulatory referral to Neurology    Complete by:  As directed   Dr. Leonie Man requests follow up for this patient in 2 months.            Medication List    ASK your doctor about these medications        amLODipine 5 MG tablet  Commonly known as:  NORVASC  Take 1 tablet (5 mg total) by mouth daily.     aspirin EC 325 MG tablet  Take 1 tablet (325 mg total) by mouth daily.     Blood Pressure Monitor Devi  Use to check Blood pressure daily     ferrous sulfate 325 (65 FE) MG tablet  Take 1 tablet (325 mg total) by mouth 3 (three) times daily with meals.     furosemide 40 MG tablet  Commonly known as:  LASIX  1 tab by mouth in the AM, with a second dose in PM as needed only for swelling     glucosamine-chondroitin 500-400 MG tablet  Take 2 tablets by mouth daily. Reported on 09/21/2015     glucose blood test strip  1 each by Other route See admin instructions. Reported on 09/07/2015     HYDROcodone-acetaminophen 7.5-325 MG tablet  Commonly known as:  NORCO  Take 1 tablet by mouth every 6 (six) hours as needed for moderate pain.     ipratropium-albuterol  0.5-2.5 (3) MG/3ML Soln  Commonly known as:  DUONEB  USE 3 ML VIA NEBULIZER EVERY 6 HOURS AS NEEDED     Lancets Misc  1 each by Other route See admin instructions. Reported on 09/07/2015     levothyroxine 112 MCG tablet  Commonly known as:  SYNTHROID, LEVOTHROID  Take 1 tablet (112 mcg total) by mouth daily.     lisinopril 20 MG tablet  Commonly known as:  PRINIVIL,ZESTRIL  Take 1 tablet (20 mg total) by mouth daily.     metFORMIN 500 MG 24 hr tablet  Commonly known as:  GLUCOPHAGE-XR  3 tabs by mouth in  the AM     Nebulizer Devi  Use as directed four times daily     ondansetron 8 MG disintegrating tablet  Commonly known as:  ZOFRAN-ODT  Take 1 tablet (8 mg total) by mouth every 8 (eight) hours as needed for nausea or vomiting.     PROAIR HFA 108 (90 Base) MCG/ACT inhaler  Generic drug:  albuterol  INHALE 2 PUFFS BY MOUTH EVERY 6 HOURS AS NEEDED FOR WHEEZING OR SHORTNESS OF BREATH     simvastatin 40 MG tablet  Commonly known as:  ZOCOR  Take 1 tablet (40 mg total) by mouth at bedtime.     tiotropium 18 MCG inhalation capsule  Commonly known as:  SPIRIVA  Place 18 mcg into inhaler and inhale 2 (two) times daily as needed (for wheezing). Reported on 09/21/2015     SPIRIVA HANDIHALER 18 MCG inhalation capsule  Generic drug:  tiotropium  INHALE 1 CAPSULE VIA HANDIHALER EVERY DAY     tiZANidine 4 MG tablet  Commonly known as:  ZANAFLEX  Take 8 mg by mouth every 6 (six) hours as needed for muscle spasms.     tiZANidine 4 MG tablet  Commonly known as:  ZANAFLEX  TAKE 1 TABLET BY MOUTH EVERY 6 HOURS AS NEEDED FOR MUSCLE SPASMS     traMADol 50 MG tablet  Commonly known as:  ULTRAM  TAKE 1-2 TABLETS BY MOUTH UP TO FOUR TIMES DAILY       Follow-up Information    Follow up with SETHI,PRAMOD, MD. Schedule an appointment as soon as possible for a visit in 2 months.   Specialties:  Neurology, Radiology   Contact information:   [address] [place] [address] [phone]         The results of significant diagnostics from this hospitalization (including imaging, microbiology, ancillary and laboratory) are listed below for reference.    Significant Diagnostic Studies: Ct Head Wo Contrast  09/27/2015  CLINICAL DATA:  Dizziness, numbness since this morning around 5 a.m. EXAM: CT HEAD WITHOUT CONTRAST TECHNIQUE: Contiguous axial images were obtained from the base of the skull through the vertex without intravenous contrast. COMPARISON:  09/23/2014 FINDINGS: Patchy  chronic small vessel disease changes throughout the deep white matter. No acute intracranial abnormality. Specifically, no hemorrhage, hydrocephalus, mass lesion, acute infarction, or significant intracranial injury. No acute calvarial abnormality. Visualized paranasal sinuses and mastoids clear. Orbital soft tissues unremarkable. IMPRESSION: Mild chronic small vessel disease. No acute intracranial abnormality. Electronically Signed   By: Rolm Baptise M.D.   On: 09/27/2015 11:52   Mr Angiogram Neck W Wo Contrast  09/27/2015  CLINICAL DATA:  Initial evaluation for new onset dizziness with gait instability and left-sided weakness. EXAM: MRI HEAD WITHOUT CONTRAST MRA NECK WITHOUT AND WITH CONTRAST TECHNIQUE: Multiplanar, multiecho pulse sequences of the brain and surrounding structures were  obtained without intravenous contrast. Angiographic images of the neck were obtained using MRA technique with and without intravenous contrast. Carotid stenosis measurements (when applicable) are obtained utilizing NASCET criteria, using the distal internal carotid diameter as the denominator. CONTRAST:  11m MULTIHANCE GADOBENATE DIMEGLUMINE 529 MG/ML IV SOLN COMPARISON:  Prior CT from earlier the same day. FINDINGS: MRI HEAD FINDINGS Diffusion-weighted imaging demonstrates patchy restricted diffusion within the posterior right lentiform nucleus extending into the right corona radiata. No associated mass-effect. Probable faint petechial hemorrhage on SWI sequence without hemorrhagic transformation. Additional punctate cortical infarct within the left occipital pole noted (series 4, image 23). Possible faint petechial hemorrhage with this as well. No other areas of infarction. Major intracranial vascular flow voids are preserved. Gray-white matter differentiation otherwise maintained. Small focus of susceptibility artifact within the left cerebellar hemisphere noted, likely a small chronic micro hemorrhage. Mild diffuse  prominence of the CSF containing spaces is compatible with generalized age-related cerebral atrophy. Patchy T2/FLAIR hyperintensity within the periventricular white matter most consistent with chronic small vessel ischemic disease, mild in nature. Small remote lacunar infarcts within the bilateral thalami and left basal ganglia. Few small remote lacunar infarcts within the bilateral corona radiata as well. No mass lesion, midline shift, or mass effect. No hydrocephalus. No extra-axial fluid collection. Major dural sinuses appear patent. Craniocervical junction widely patent and normal in appearance. Scattered multilevel degenerative spondylolysis present within the upper cervical spine without significant stenosis. Pituitary gland within normal limits. No acute abnormality about the orbits. Paranasal sinuses are clear. Right mastoid effusion noted, likely benign. Inner ear structures grossly normal. Bone marrow signal intensity within normal limits. No scalp soft tissue abnormality. MRA NECK FINDINGS Study somewhat limited by motion artifact. Visualized aortic arch is of normal caliber with normal branch pattern. Possible moderate to severe stenosis at the origin of left common carotid artery. No other high-grade stenosis at the origin of the great vessels. Visualized subclavian arteries are well opacified. Right common carotid artery patent from its origin to the bifurcation. There is a centric plaque at the proximal right ICA with secondary moderate to severe short-segment stenosis. Right ICA patent distally to the skullbase without additional stenosis. Possible moderate to severe stenosis at the origin of the left common carotid artery, not well evaluated in this exam. Left common carotid artery otherwise patent to the bifurcation. Mild atheromatous irregularity about the left carotid bifurcation without significant stenosis. Left ICA well opacified distally to the skullbase. Origin of the vertebral arteries not  well evaluated on this exam. Vertebral arteries are patent distally with antegrade flow to the skullbase without occlusion or definite high-grade stenosis. The left vertebral artery is dominant. IMPRESSION: MRI HEAD IMPRESSION: 1. Acute ischemic infarct involving the right basal ganglia/ corona radiata without mass effect. Probable faint petechial hemorrhage without evidence for hemorrhagic transformation. 2. Additional punctate cortical infarct within the left occipital pole. Possible faint petechial hemorrhage with this infarct as well. 3. Age-related cerebral atrophy with mild chronic small vessel ischemic disease with additional scattered remote lacunar infarcts as above. MRA NECK IMPRESSION: 1. Limited study due to motion artifact. 2. Eccentric plaque at the proximal right ICA with secondary moderate to severe short-segment stenosis. Correlation with carotid Doppler ultrasound and/or CTA could be performed for further evaluation. 3. Possible moderate to severe stenosis at the origin of the left common carotid artery, poorly evaluated on this exam. 4. No other critical or flow-limiting stenosis identified within the major arterial vasculature of the neck. 5. Poor evaluation of the  proximal vertebral arteries. Vertebral arteries are patent distally with antegrade flow to the skullbase. Electronically Signed   By: Jeannine Boga M.D.   On: 09/27/2015 22:12   Mr Brain Wo Contrast  09/27/2015  CLINICAL DATA:  Initial evaluation for new onset dizziness with gait instability and left-sided weakness. EXAM: MRI HEAD WITHOUT CONTRAST MRA NECK WITHOUT AND WITH CONTRAST TECHNIQUE: Multiplanar, multiecho pulse sequences of the brain and surrounding structures were obtained without intravenous contrast. Angiographic images of the neck were obtained using MRA technique with and without intravenous contrast. Carotid stenosis measurements (when applicable) are obtained utilizing NASCET criteria, using the distal  internal carotid diameter as the denominator. CONTRAST:  54m MULTIHANCE GADOBENATE DIMEGLUMINE 529 MG/ML IV SOLN COMPARISON:  Prior CT from earlier the same day. FINDINGS: MRI HEAD FINDINGS Diffusion-weighted imaging demonstrates patchy restricted diffusion within the posterior right lentiform nucleus extending into the right corona radiata. No associated mass-effect. Probable faint petechial hemorrhage on SWI sequence without hemorrhagic transformation. Additional punctate cortical infarct within the left occipital pole noted (series 4, image 23). Possible faint petechial hemorrhage with this as well. No other areas of infarction. Major intracranial vascular flow voids are preserved. Gray-white matter differentiation otherwise maintained. Small focus of susceptibility artifact within the left cerebellar hemisphere noted, likely a small chronic micro hemorrhage. Mild diffuse prominence of the CSF containing spaces is compatible with generalized age-related cerebral atrophy. Patchy T2/FLAIR hyperintensity within the periventricular white matter most consistent with chronic small vessel ischemic disease, mild in nature. Small remote lacunar infarcts within the bilateral thalami and left basal ganglia. Few small remote lacunar infarcts within the bilateral corona radiata as well. No mass lesion, midline shift, or mass effect. No hydrocephalus. No extra-axial fluid collection. Major dural sinuses appear patent. Craniocervical junction widely patent and normal in appearance. Scattered multilevel degenerative spondylolysis present within the upper cervical spine without significant stenosis. Pituitary gland within normal limits. No acute abnormality about the orbits. Paranasal sinuses are clear. Right mastoid effusion noted, likely benign. Inner ear structures grossly normal. Bone marrow signal intensity within normal limits. No scalp soft tissue abnormality. MRA NECK FINDINGS Study somewhat limited by motion artifact.  Visualized aortic arch is of normal caliber with normal branch pattern. Possible moderate to severe stenosis at the origin of left common carotid artery. No other high-grade stenosis at the origin of the great vessels. Visualized subclavian arteries are well opacified. Right common carotid artery patent from its origin to the bifurcation. There is a centric plaque at the proximal right ICA with secondary moderate to severe short-segment stenosis. Right ICA patent distally to the skullbase without additional stenosis. Possible moderate to severe stenosis at the origin of the left common carotid artery, not well evaluated in this exam. Left common carotid artery otherwise patent to the bifurcation. Mild atheromatous irregularity about the left carotid bifurcation without significant stenosis. Left ICA well opacified distally to the skullbase. Origin of the vertebral arteries not well evaluated on this exam. Vertebral arteries are patent distally with antegrade flow to the skullbase without occlusion or definite high-grade stenosis. The left vertebral artery is dominant. IMPRESSION: MRI HEAD IMPRESSION: 1. Acute ischemic infarct involving the right basal ganglia/ corona radiata without mass effect. Probable faint petechial hemorrhage without evidence for hemorrhagic transformation. 2. Additional punctate cortical infarct within the left occipital pole. Possible faint petechial hemorrhage with this infarct as well. 3. Age-related cerebral atrophy with mild chronic small vessel ischemic disease with additional scattered remote lacunar infarcts as above. MRA NECK IMPRESSION: 1.  Limited study due to motion artifact. 2. Eccentric plaque at the proximal right ICA with secondary moderate to severe short-segment stenosis. Correlation with carotid Doppler ultrasound and/or CTA could be performed for further evaluation. 3. Possible moderate to severe stenosis at the origin of the left common carotid artery, poorly evaluated on  this exam. 4. No other critical or flow-limiting stenosis identified within the major arterial vasculature of the neck. 5. Poor evaluation of the proximal vertebral arteries. Vertebral arteries are patent distally with antegrade flow to the skullbase. Electronically Signed   By: Jeannine Boga M.D.   On: 09/27/2015 22:12    Microbiology: Recent Results (from the past 240 hour(s))  MRSA PCR Screening     Status: None   Collection Time: 09/27/15 10:07 PM  Result Value Ref Range Status   MRSA by PCR NEGATIVE NEGATIVE Final    Comment:        The GeneXpert MRSA Assay (FDA approved for NASAL specimens only), is one component of a comprehensive MRSA colonization surveillance program. It is not intended to diagnose MRSA infection nor to guide or monitor treatment for MRSA infections.      Labs: Basic Metabolic Panel:  Recent Labs Lab 09/27/15 1015 09/27/15 1030 09/28/15 0605 09/29/15 0530  NA 138 139 138 140  K 4.1 4.1 3.8 4.4  CL 102 98* 103 104  CO2 25  --  28 30  GLUCOSE 196* 196* 159* 155*  BUN '12 13 15 19  '$ CREATININE 1.16 1.20 1.21 1.18  CALCIUM 8.5*  --  8.6* 8.7*   Liver Function Tests:  Recent Labs Lab 09/27/15 1015  AST 43*  ALT 30  ALKPHOS 131*  BILITOT 0.3  PROT 6.0*  ALBUMIN 2.1*   No results for input(s): LIPASE, AMYLASE in the last 168 hours. No results for input(s): AMMONIA in the last 168 hours. CBC:  Recent Labs Lab 09/27/15 1015 09/27/15 1030 09/28/15 0605  WBC 5.0  --  5.7  NEUTROABS 2.9  --   --   HGB 13.0 13.9 13.9  HCT 39.8 41.0 41.6  MCV 80.6  --  80.3  PLT 191  --  223   Cardiac Enzymes: No results for input(s): CKTOTAL, CKMB, CKMBINDEX, TROPONINI in the last 168 hours. BNP: BNP (last 3 results) No results for input(s): BNP in the last 8760 hours.  ProBNP (last 3 results) No results for input(s): PROBNP in the last 8760 hours.  CBG:  Recent Labs Lab 09/28/15 1127 09/28/15 1643 09/28/15 2303 09/29/15 0621  09/29/15 1104  GLUCAP 125* 114* 135* 153* 166*       Signed:  Vernell Leep, MD, FACP, FHM. [facility] Pager [phone]  If 7PM-7AM, please contact night-coverage [email or web address] Password [facility] 09/29/2015, 6:05 PM

## 2015-10-01 ENCOUNTER — Other Ambulatory Visit: Payer: Self-pay | Admitting: *Deleted

## 2015-10-01 NOTE — Patient Outreach (Signed)
Member recently admitted to hospital for stroke, presented with facial droop and weakness.  He left against medical advise on Wednesday, 2/22.    Call placed to initiate transition of care program.  He reports that he is "doing ok," stating that he feel a little better, and has been "taking it easy."  He denies any weakness or trouble walking since his discharge.  He reports that he will need surgery on "the left side of my neck" but will have to be cleared by ENT before it can be scheduled.  He state that he has cut back on his cigarettes and is attempting to completely quit.    Member reports that since he left AMA, he was unable to get a current list of medications that he should be taking.  He state that he will continue to take what he was on before he was admitted.  He does have some refills waiting for him at the pharmacy, but is unable to get them until he is paid next week.  He has also been unable to obtain the nebulizer machine.  He has only contacted 2 places on the list provided (because they were closer), and state that he will continue to call the resources provided until the machine is obtained.    He denies any urgent concerns at this time.  Encouraged to contact this care manager with questions.  He is made aware that this care manager will provide a blood pressure monitor and scale during next home visit.  Will continue weekly calls next week.  [name], BSN, [facility] [employer]  [facility] Care Manager [phone]

## 2015-10-05 ENCOUNTER — Encounter: Payer: Self-pay | Admitting: Pharmacist

## 2015-10-05 ENCOUNTER — Other Ambulatory Visit: Payer: Self-pay | Admitting: Pharmacist

## 2015-10-05 ENCOUNTER — Other Ambulatory Visit: Payer: Self-pay | Admitting: Internal Medicine

## 2015-10-05 NOTE — Patient Outreach (Signed)
[name]) Care Management  [place]   10/05/2015  Brad [name] 09/30/1955 [id number]  Subjective: Brad [name] is a 60yo who was referred to [place] from [place] for medication assistance.  Per referral, patient's Spiriva is no longer covered by patient's insurance.    I made outreach call to patient.  Patient reports he is doing okay.  Patient confirms he is currently out of Spiriva as medication is no longer covered by his insurance and he cannot afford to pay for the medication.  Patient reports he is using DuoNebs as needed.    Patient was hospitalized from 09/27/15 to 09/29/15 following acute CVA.  Patient left the hospital AMA on 09/29/15 and per discharge summary, patient was called 48 hours after discharge and advised to follow-up with his PCP ASAP.  Noted that patient has not scheduled a hospital follow up appointment with his PCP.  Counseled patient on importance of scheduling a hospital follow up with his PCP.     Objective:   Current Medications: Current Outpatient Prescriptions  Medication Sig Dispense Refill  . amLODipine (NORVASC) 5 MG tablet Take 1 tablet (5 mg total) by mouth daily. 90 tablet 3  . aspirin EC 325 MG tablet Take 1 tablet (325 mg total) by mouth daily. (Patient not taking: Reported on 09/21/2015) 30 tablet 0  . Blood Pressure Monitor DEVI Use to check Blood pressure daily 1 Device 0  . ferrous sulfate 325 (65 FE) MG tablet Take 1 tablet (325 mg total) by mouth 3 (three) times daily with meals. 30 tablet 3  . furosemide (LASIX) 40 MG tablet 1 tab by mouth in the AM, with a second dose in PM as needed only for swelling (Patient taking differently: Take 40 mg by mouth daily. Can take second dose in evening as needed for swelling) 60 tablet 11  . glucosamine-chondroitin 500-400 MG tablet Take 2 tablets by mouth daily. Reported on 09/21/2015    . glucose blood test strip 1 each by Other route See admin  instructions. Reported on 09/07/2015    . HYDROcodone-acetaminophen (NORCO) 7.5-325 MG tablet Take 1 tablet by mouth every 6 (six) hours as needed for moderate pain. (Patient not taking: Reported on 09/21/2015) 120 tablet 0  . ipratropium-albuterol (DUONEB) 0.5-2.5 (3) MG/3ML SOLN USE 3 ML VIA NEBULIZER EVERY 6 HOURS AS NEEDED (Patient not taking: Reported on 09/21/2015) 3240 mL 3  . Lancets MISC 1 each by Other route See admin instructions. Reported on 09/07/2015    . levothyroxine (SYNTHROID, LEVOTHROID) 112 MCG tablet Take 1 tablet (112 mcg total) by mouth daily. 90 tablet 3  . lisinopril (PRINIVIL,ZESTRIL) 20 MG tablet Take 1 tablet (20 mg total) by mouth daily. 90 tablet 0  . metFORMIN (GLUCOPHAGE-XR) 500 MG 24 hr tablet 3 tabs by mouth in the AM (Patient taking differently: Take 1,000 mg by mouth daily with breakfast. Only taking 2 tablets) 270 tablet 3  . ondansetron (ZOFRAN-ODT) 8 MG disintegrating tablet Take 1 tablet (8 mg total) by mouth every 8 (eight) hours as needed for nausea or vomiting. (Patient not taking: Reported on 09/22/2015) 20 tablet 0  . PROAIR HFA 108 (90 BASE) MCG/ACT inhaler INHALE 2 PUFFS BY MOUTH EVERY 6 HOURS AS NEEDED FOR WHEEZING OR SHORTNESS OF BREATH 8.5 g 11  . Respiratory Therapy Supplies (NEBULIZER) DEVI Use as directed four times daily (Patient not taking: Reported on 09/22/2015) 1 each 0  . simvastatin (ZOCOR) 40 MG tablet Take  1 tablet (40 mg total) by mouth at bedtime. (Patient not taking: Reported on 09/22/2015) 90 tablet 0  . SPIRIVA HANDIHALER 18 MCG inhalation capsule INHALE 1 CAPSULE VIA HANDIHALER EVERY DAY (Patient not taking: Reported on 09/21/2015) 30 capsule 11  . tiotropium (SPIRIVA) 18 MCG inhalation capsule Place 18 mcg into inhaler and inhale 2 (two) times daily as needed (for wheezing). Reported on 09/21/2015    . tiZANidine (ZANAFLEX) 4 MG tablet Take 8 mg by mouth every 6 (six) hours as needed for muscle spasms.    Marland Kitchen tiZANidine (ZANAFLEX) 4 MG tablet  TAKE 1 TABLET BY MOUTH EVERY 6 HOURS AS NEEDED FOR MUSCLE SPASMS 60 tablet 1  . traMADol (ULTRAM) 50 MG tablet TAKE 1-2 TABLETS BY MOUTH UP TO FOUR TIMES DAILY 120 tablet 5   No current facility-administered medications for this visit.   Functional Status: In your present state of health, do you have any difficulty performing the following activities: 09/27/2015 09/21/2015  Hearing? Tempie Donning  Vision? N Y  Difficulty concentrating or making decisions? N N  Walking or climbing stairs? Y Y  Dressing or bathing? N N  Doing errands, shopping? N N  Preparing Food and eating ? - N  Using the Toilet? - N  In the past six months, have you accidently leaked urine? - N  Do you have problems with loss of bowel control? - N  Managing your Medications? - N  Managing your Finances? - N  Housekeeping or managing your Housekeeping? - N   Fall/Depression Screening: PHQ 2/9 Scores 09/21/2015 06/14/2015  PHQ - 2 Score 0 0    Assessment: 1.  Medication assistance:  Patient reports Spiriva is no longer covered by his insurance.  Reviewed patient's insurance plan.  Patient currently has Medicare, Medicaid, and [employer] for Part D.  Patient receives full Extra Help for his medications through social security.  I reviewed [employer] Part D medication formulary and identified that the long-acting anticholinergic bronchodilator on formulary is Incruse Ellipta.  Reviewed this information with patient.  Patient voices understanding and was agreeable to have me contact patient's provider regarding formulary alternative.    2.  Medication adherence:  Reviewed medications with patient.  Patient reports he is currently out of simvastatin, aspirin, and lisinopril.  Counseled patient on importance of adherence with all of his medications.  Patient reports he will get paid tomorrow and he will to go pick up medication refills tomorrow.  Reviewed all medications with patient and discussed purpose, proper use, and  adverse effects.  Counseled patient on importance of taking medications as prescribed.    Plan: 1.  Medication assistance:  I will send a message to patient's PCP to update PCP that Spiriva is not covered under patient's part D insurance plan, and that the long-acting anticholinergic bronchodilator on formulary is Incruse Ellipta.   2.  Patient will pick up prescriptions for aspirin, lisinopril, and pravastatin from the pharmacy tomorrow and will take all medications as prescribed.  3.  Patient reports he will call his PCP office to request a hospital follow up appointment.  4.  Will follow up with patient on 10/06/15.     Elisabeth Most, Pharm.D. Pharmacy Resident [place] [phone]

## 2015-10-06 ENCOUNTER — Other Ambulatory Visit: Payer: Self-pay | Admitting: Pharmacist

## 2015-10-06 NOTE — Patient Outreach (Signed)
[place] [facility]) Care Management  [place]   10/06/2015  Brad [name] [birth year] [id number]   Brad [name] is a 60yo who was referred to [place] for medication assistance.  Per referral, patient's Spiriva is no longer covered by patient's insurance.    I made outreach call to patient today to follow up on whether he was able to pick up his prescriptions for aspirin, lisinopril, and simvastatin from the pharmacy today.  I made several outreach attempts to reach patient but there was no answer.  Patient's phone rang continuously without any option to leave a voicemail.    I will make another outreach call to patient on 10/08/15.     Elisabeth Most, Pharm.D. Pharmacy Resident [place] [phone]

## 2015-10-07 DIAGNOSIS — R490 Dysphonia: Secondary | ICD-10-CM | POA: Diagnosis not present

## 2015-10-07 DIAGNOSIS — J410 Simple chronic bronchitis: Secondary | ICD-10-CM | POA: Diagnosis not present

## 2015-10-07 DIAGNOSIS — R1310 Dysphagia, unspecified: Secondary | ICD-10-CM | POA: Diagnosis not present

## 2015-10-07 DIAGNOSIS — R05 Cough: Secondary | ICD-10-CM | POA: Diagnosis not present

## 2015-10-08 ENCOUNTER — Ambulatory Visit: Payer: Self-pay | Admitting: *Deleted

## 2015-10-08 ENCOUNTER — Other Ambulatory Visit: Payer: Self-pay | Admitting: Pharmacist

## 2015-10-08 ENCOUNTER — Other Ambulatory Visit: Payer: Self-pay | Admitting: *Deleted

## 2015-10-08 NOTE — Patient Outreach (Signed)
Weekly transition of care call placed to member.  Several attempts made, phone rings busy.  Will attempt again next week to contact member.  [name], BSN, [place] Management  [facility] Care Manager [phone]

## 2015-10-08 NOTE — Patient Outreach (Addendum)
[place] [facility]) Care Management  [place]   10/08/2015  Brad [name] 1955-11-14 [id number]  Subjective: Brad [name] is a 60yo who was referred to [place] from [place] for medication assistance.  Per referral, patient's Spiriva is no longer covered by patient's insurance.    I made outreach call to patient to follow up on whether he was able to pick up his prescriptions for aspirin, lisinopril, and simvastatin from the pharmacy.  Patient reports he picked up prescriptions for simvastatin, purchased aspirin 81 mg and ferrous sulfate 325 mg tablets over the counter, and was able to purchase Spiriva prescriptions as well.  Patient reports that Spiriva copay was only $3.50 and denies issues with Spiriva and insurance coverage at this time.  Patient states he did not know he had a prescription for lisinopril to pick up from the pharmacy but reports he will go pick up that prescription today.    Patient also confirms that he called his PCP office and has a hospital follow up appointment scheduled with [name] on 10/14/15.    Objective:   Current Medications: Current Outpatient Prescriptions  Medication Sig Dispense Refill  . amLODipine (NORVASC) 5 MG tablet Take 1 tablet (5 mg total) by mouth daily. 90 tablet 3  . aspirin EC 325 MG tablet Take 1 tablet (325 mg total) by mouth daily. (Patient taking differently: Take 81 mg by mouth daily. ) 30 tablet 0  . ferrous sulfate 325 (65 FE) MG tablet Take 1 tablet (325 mg total) by mouth 3 (three) times daily with meals. 30 tablet 3  . furosemide (LASIX) 40 MG tablet 1 tab by mouth in the AM, with a second dose in PM as needed only for swelling (Patient taking differently: Take 40 mg by mouth daily. Can take second dose in evening as needed for swelling) 60 tablet 11  . glucose blood test strip 1 each by Other route See admin instructions. Reported on 09/07/2015    . ipratropium-albuterol (DUONEB) 0.5-2.5 (3)  MG/3ML SOLN USE 3 ML VIA NEBULIZER EVERY 6 HOURS AS NEEDED 3240 mL 3  . Lancets MISC 1 each by Other route See admin instructions. Reported on 09/07/2015    . levothyroxine (SYNTHROID, LEVOTHROID) 112 MCG tablet Take 1 tablet (112 mcg total) by mouth daily. 90 tablet 3  . metFORMIN (GLUCOPHAGE-XR) 500 MG 24 hr tablet 3 tabs by mouth in the AM (Patient taking differently: Take 1,000 mg by mouth daily with breakfast. Only taking 2 tablets) 270 tablet 3  . Respiratory Therapy Supplies (NEBULIZER) DEVI Use as directed four times daily 1 each 0  . simvastatin (ZOCOR) 40 MG tablet Take 1 tablet (40 mg total) by mouth at bedtime. 90 tablet 0  . SPIRIVA HANDIHALER 18 MCG inhalation capsule INHALE 1 CAPSULE VIA HANDIHALER EVERY DAY 30 capsule 11  . tiZANidine (ZANAFLEX) 4 MG tablet TAKE 1 TABLET BY MOUTH EVERY 6 HOURS AS NEEDED FOR MUSCLE SPASMS 60 tablet 0  . traMADol (ULTRAM) 50 MG tablet TAKE 1-2 TABLETS BY MOUTH UP TO FOUR TIMES DAILY 120 tablet 5  . Blood Pressure Monitor DEVI Use to check Blood pressure daily (Patient not taking: Reported on 10/08/2015) 1 Device 0  . HYDROcodone-acetaminophen (NORCO) 7.5-325 MG tablet Take 1 tablet by mouth every 6 (six) hours as needed for moderate pain. (Patient not taking: Reported on 09/21/2015) 120 tablet 0  . lisinopril (PRINIVIL,ZESTRIL) 20 MG tablet Take 1 tablet (20 mg total) by mouth  daily. 90 tablet 0  . ondansetron (ZOFRAN-ODT) 8 MG disintegrating tablet Take 1 tablet (8 mg total) by mouth every 8 (eight) hours as needed for nausea or vomiting. (Patient not taking: Reported on 09/22/2015) 20 tablet 0  . PROAIR HFA 108 (90 BASE) MCG/ACT inhaler INHALE 2 PUFFS BY MOUTH EVERY 6 HOURS AS NEEDED FOR WHEEZING OR SHORTNESS OF BREATH (Patient not taking: Reported on 10/05/2015) 8.5 g 11   No current facility-administered medications for this visit.    Functional Status: In your present state of health, do you have any difficulty performing the following activities:  09/27/2015 09/21/2015  Hearing? Tempie Donning  Vision? N Y  Difficulty concentrating or making decisions? N N  Walking or climbing stairs? Y Y  Dressing or bathing? N N  Doing errands, shopping? N N  Preparing Food and eating ? - N  Using the Toilet? - N  In the past six months, have you accidently leaked urine? - N  Do you have problems with loss of bowel control? - N  Managing your Medications? - N  Managing your Finances? - N  Housekeeping or managing your Housekeeping? - N    Fall/Depression Screening: PHQ 2/9 Scores 09/21/2015 06/14/2015  PHQ - 2 Score 0 0    Assessment: 1.  Medication assistance:  Patient previously reported that Spiriva was not covered by his insurance, but today patient denies medication assistance needs as he was able to get Spiriva filled with a copay of $3.50.    2.  Medication adherence:  Reviewed medications with patient.  Patient reports adherence with all medications except lisinopril.  Counseled patient on importance of adherence with all of his medications.  Patient reports he will pick up lisinopril prescription from the pharmacy today.  Reviewed all medications with patient and discussed purpose, proper use, and adverse effects.  Counseled patient on importance of taking medications as prescribed.  Patient reports he had a pill box at home but he does not currently use it.  Patient reports adherence with his medications and denies any problems remembering to take his medications.  Discussed scheduling a home visit to provider further medication education and to evaluate medication management.  Patient declines a home visit at this time.     3.  Medication review:  Medication review completed by [facility] Pharmacist on 06/23/15 and agree with her recommendations.  Patient has not had any medication changes since that time except initiation of as needed albuterol-ipratropium nebulizers.  Patient is currently on amlodipine and simvastatin, and amlodipine may increase the serum  concentration of simvastatin.  Doses of simvastatin greater than 20 mg daily are not recommended in patients treated with amlodipine.  Of note, high-intensity statin therapy is recommended in this patient due to clinical ASCVD (history of recent CVA).  Note that patient has a history of myalgia with atorvastatin.     Plan: 1.  Patient to pick up lisinopril from the pharmacy and to take all medications as prescribed.  Due to patient's reported non-adherence with lisinopril since he was discharged and patient's plan to resume lisinopril today, recommend obtaining an updated BMET at PCP office visit on 10/14/15.   2.  Based on patient's history of recent CVA, will recommend use of a high intensity statin, such as rosuvastatin 20 or 40 mg daily rather than simvastatin. Note that this would also avoid the current drug interaction between patient's simvastatin and amlodipine. Communication sent to patient's PCP within this encounter. 3.  Patient to attend hospital  follow up visit with his PCP on 10/14/15.   4. Follow up phone call scheduled for 10/15/15.     Elisabeth Most, Pharm.D. Pharmacy Resident [place] [phone]

## 2015-10-11 DIAGNOSIS — L718 Other rosacea: Secondary | ICD-10-CM | POA: Diagnosis not present

## 2015-10-11 DIAGNOSIS — L821 Other seborrheic keratosis: Secondary | ICD-10-CM | POA: Diagnosis not present

## 2015-10-11 DIAGNOSIS — L281 Prurigo nodularis: Secondary | ICD-10-CM | POA: Diagnosis not present

## 2015-10-12 ENCOUNTER — Ambulatory Visit: Payer: Self-pay | Admitting: *Deleted

## 2015-10-12 ENCOUNTER — Other Ambulatory Visit: Payer: Self-pay | Admitting: *Deleted

## 2015-10-12 NOTE — Patient Outreach (Signed)
Weekly transition of care call placed to member.  Number rings busy, unable to leave a message.  This makes the second time that this care manager has been unable to contact member.  [phone] is the number listed and the number that member was originally contacted at.  Will contact pharmacist, R. Koleen Nimrod, to inquire about another contact number.  Will also contact PCP office to obtain an alternate number.  Will try again next week to contact.    Brad [name], BSN, [place] Management  [facility] Care Manager [phone]

## 2015-10-14 ENCOUNTER — Ambulatory Visit (INDEPENDENT_AMBULATORY_CARE_PROVIDER_SITE_OTHER): Payer: Medicare Other | Admitting: Internal Medicine

## 2015-10-14 VITALS — BP 160/72 | HR 75 | Wt 279.0 lb

## 2015-10-14 DIAGNOSIS — R931 Abnormal findings on diagnostic imaging of heart and coronary circulation: Secondary | ICD-10-CM | POA: Diagnosis not present

## 2015-10-14 DIAGNOSIS — I6529 Occlusion and stenosis of unspecified carotid artery: Secondary | ICD-10-CM

## 2015-10-14 DIAGNOSIS — I10 Essential (primary) hypertension: Secondary | ICD-10-CM

## 2015-10-14 DIAGNOSIS — I639 Cerebral infarction, unspecified: Secondary | ICD-10-CM | POA: Diagnosis not present

## 2015-10-14 DIAGNOSIS — J438 Other emphysema: Secondary | ICD-10-CM

## 2015-10-14 DIAGNOSIS — I429 Cardiomyopathy, unspecified: Secondary | ICD-10-CM | POA: Diagnosis not present

## 2015-10-14 MED ORDER — AMLODIPINE BESYLATE 5 MG PO TABS: 5.0000 mg | ORAL_TABLET | Freq: Every day | ORAL | Status: DC

## 2015-10-14 MED ORDER — IPRATROPIUM BROMIDE HFA 17 MCG/ACT IN AERS: 2.0000 | INHALATION_SPRAY | Freq: Four times a day (QID) | RESPIRATORY_TRACT | Status: DC

## 2015-10-14 NOTE — Progress Notes (Signed)
Subjective:    Patient ID: Brad [name], male    DOB: February 25, 1956, 60 y.o.   MRN: [id number]  HPI  Here to f/u, unfortunately was recently hospd 2/20 - 88/1: 60 year old male with a history of stroke in 2015, diabetes mellitus, hypertension, hyperlipidemia, continued tobacco use, carotid stenosis (L-CEA on 11/13/14) presented with new-onset gait unsteadiness and subjective dizziness, as well as left arm sensory numbness and weakness. Around 5 PM on 09/27/2015, the patient felt "like my legs would not move where I wanted them to". Gait was wobbly with a veering quality. During the hospitalization, he also felt "the bowl slipped out of my hand". The patient was admitted for stroke workup. MRI of the brain showed an acute ischemic component in the right basal ganglia. Neurology was consulted.  Pt confirmed to have suffered transient left sided weakness with right basal ganglia infarct felt to be related to mod prox right carotid stenosis. S/p PT/OT eval.  Pt became irritated it seems at time of d/c, felt he had to wait too long, and left AMA. States current taking the plavix but only 81 mg asa. Wants to know if needs to continue the amlodipine, in fact has been out of all BP med for the past wk.    BP Readings from Last 3 Encounters:  10/14/15 160/72  09/29/15 162/78  09/21/15 160/88  Still does not have neb machine. Upset as has not heard yet about hep c referral., blames this for his recent attitude and behavior.   Spiriva no longer covered with insurance, asks for refill, has list of meds to choose from sent by his insurance. Past Medical History  Diagnosis Date  . Depression   . GERD (gastroesophageal reflux disease)   . Hyperlipidemia 08/27/2014    takes Simvastatin daily  . Hypertension     takes Amlodipine and Lisinopril daily  . Hypothyroidism 08/27/2014    takes Synthroid daily  . Anemia     takes Ferrous Sulfated daily  . Carotid stenosis 08/27/2014    takes Aspirin daily  . Diabetes  mellitus type II     takes Metformin and Glipizide daily  . Muscle spasm     takes Zanaflex daily as needed  . Insomnia     takes Pamelor nightly  . COPD (chronic obstructive pulmonary disease) (HCC)     Albuterol daily as needed and Spiriva daily  . MVP (mitral valve prolapse)   . History of blood clots     in right leg;being followed by Dr.Brabham for this  . Shortness of breath dyspnea   . Peripheral neuropathy ([place])   . Headache     occasionally  . Dizziness   . Stroke ([place])   . Arthritis   . Joint pain     hips  . Peripheral edema     takes Furosemide daily as needed  . History of hiatal hernia   . History of gastric ulcer   . Urinary frequency   . Urinary urgency   . History of blood transfusion 09/2014    2 units-no abnormal reaction noted  . HTN (hypertension) 09/14/2015  . Chronic hepatitis C ([place]) 09/17/2015  . Prurigo nodularis    Past Surgical History  Procedure Laterality Date  . Neg stress test  2000  . Rotator cuff repair Right 2009    multiple f/u Sxs/infection Tx  . Incision and drainage perirectal abscess  03/2009  . Esophagogastroduodenoscopy N/A 09/24/2014    Procedure: ESOPHAGOGASTRODUODENOSCOPY (EGD);  Surgeon: Jeneen Rinks  Angeline Slim., MD;  Location: [facility] ENDOSCOPY;  Service: Endoscopy;  Laterality: N/A;  . I&d of right arm    . Endarterectomy Left 11/13/2014    Procedure: ENDARTERECTOMY CAROTID WITH PATCH ANGIOPLASTY;  Surgeon: Serafina Mitchell, MD;  Location: [facility];  Service: Vascular;  Laterality: Left;    reports that he has been smoking Cigarettes.  He has a 96 pack-year smoking history. He has never used smokeless tobacco. He reports that he drinks alcohol. He reports that he does not use illicit drugs. family history includes Alcohol abuse in his father; Diabetes type II in his mother; Heart disease in his mother; Lung cancer in his father, maternal grandmother, and sister. Allergies  Allergen Reactions  . Doxycycline Hives and Itching  . Lipitor  [Atorvastatin] Other (See Comments)    Myalgia   . Oxycodone Itching   Current Outpatient Prescriptions on File Prior to Visit  Medication Sig Dispense Refill  . aspirin EC 325 MG tablet Take 1 tablet (325 mg total) by mouth daily. (Patient taking differently: Take 81 mg by mouth daily. ) 30 tablet 0  . Blood Pressure Monitor DEVI Use to check Blood pressure daily 1 Device 0  . ferrous sulfate 325 (65 FE) MG tablet Take 1 tablet (325 mg total) by mouth 3 (three) times daily with meals. 30 tablet 3  . furosemide (LASIX) 40 MG tablet 1 tab by mouth in the AM, with a second dose in PM as needed only for swelling (Patient taking differently: Take 40 mg by mouth daily. Can take second dose in evening as needed for swelling) 60 tablet 11  . glucose blood test strip 1 each by Other route See admin instructions. Reported on 09/07/2015    . ipratropium-albuterol (DUONEB) 0.5-2.5 (3) MG/3ML SOLN USE 3 ML VIA NEBULIZER EVERY 6 HOURS AS NEEDED 3240 mL 3  . Lancets MISC 1 each by Other route See admin instructions. Reported on 09/07/2015    . levothyroxine (SYNTHROID, LEVOTHROID) 112 MCG tablet Take 1 tablet (112 mcg total) by mouth daily. 90 tablet 3  . lisinopril (PRINIVIL,ZESTRIL) 20 MG tablet Take 1 tablet (20 mg total) by mouth daily. 90 tablet 0  . metFORMIN (GLUCOPHAGE-XR) 500 MG 24 hr tablet 3 tabs by mouth in the AM (Patient taking differently: Take 1,000 mg by mouth daily with breakfast. Only taking 2 tablets) 270 tablet 3  . ondansetron (ZOFRAN-ODT) 8 MG disintegrating tablet Take 1 tablet (8 mg total) by mouth every 8 (eight) hours as needed for nausea or vomiting. 20 tablet 0  . PROAIR HFA 108 (90 BASE) MCG/ACT inhaler INHALE 2 PUFFS BY MOUTH EVERY 6 HOURS AS NEEDED FOR WHEEZING OR SHORTNESS OF BREATH 8.5 g 11  . Respiratory Therapy Supplies (NEBULIZER) DEVI Use as directed four times daily 1 each 0  . simvastatin (ZOCOR) 40 MG tablet Take 1 tablet (40 mg total) by mouth at bedtime. 90 tablet 0  .  tiZANidine (ZANAFLEX) 4 MG tablet TAKE 1 TABLET BY MOUTH EVERY 6 HOURS AS NEEDED FOR MUSCLE SPASMS 60 tablet 0  . traMADol (ULTRAM) 50 MG tablet TAKE 1-2 TABLETS BY MOUTH UP TO FOUR TIMES DAILY 120 tablet 5  . HYDROcodone-acetaminophen (NORCO) 7.5-325 MG tablet Take 1 tablet by mouth every 6 (six) hours as needed for moderate pain. (Patient not taking: Reported on 10/14/2015) 120 tablet 0   No current facility-administered medications on file prior to visit.   Review of Systems  Constitutional: Negative for unusual diaphoresis or night sweats  HENT: Negative for ringing in ear or discharge Eyes: Negative for double vision or worsening visual disturbance.  Respiratory: Negative for choking and stridor.   Gastrointestinal: Negative for vomiting or other signifcant bowel change Genitourinary: Negative for hematuria or change in urine volume.  Musculoskeletal: Negative for other MSK pain or swelling Skin: Negative for color change and worsening wound.  Neurological: Negative for tremors and numbness other than noted  Psychiatric/Behavioral: Negative for decreased concentration or agitation other than above       Objective:   Physical Exam BP 160/72 mmHg  Pulse 75  Wt 279 lb (126.554 kg)  SpO2 97% VS noted, not ill appearing Constitutional: Pt appears in no significant distress HENT: Head: NCAT.  Right Ear: External ear normal.  Left Ear: External ear normal.  Eyes: . Pupils are equal, round, and reactive to light. Conjunctivae and EOM are normal Neck: Normal range of motion. Neck supple.  Cardiovascular: Normal rate and regular rhythm.   Pulmonary/Chest: Effort normal and breath sounds decreased without rales or wheezing.  Abd:  Soft, NT, ND, + BS Neurological: Pt is alert. Not confused , o/w not done in detail Skin: Skin is warm. No rash, no LE edema Psychiatric: Pt behavior is normal. No agitation.   Result status: Final result      *[facility]*  [address], [place] [address]  [phone]  ------------------------------------------------------------------- Transthoracic Echocardiography  Patient: Omarie, Parcell MR #: [id number] Study Date: 09/29/2015 Gender: M Age: 26 Height: 180.3 cm Weight: 119.7 kg BSA: 2.49 m^2 Pt. Status: Room: 5M19C  ATTENDING [place], [place], [place] SONOGRAPHER [address] Etta Grandchild [id number] [place] Bigness [phone] PERFORMING [facility], Inpatient  cc:  ------------------------------------------------------------------- LV EF: 45% - 50%  ------------------------------------------------------------------- Indications: CVA 60.  ------------------------------------------------------------------- History: PMH: Anemia. Stroke. Risk factors: Current tobacco use. Hypertension. Diabetes mellitus. Dyslipidemia.  ------------------------------------------------------------------- Study Conclusions  - Left ventricle: The apex is not adequately visualized to rule out  apical thrombus. The cavity size was normal. There was mild  concentric hypertrophy. Systolic function was mildly reduced. The  estimated ejection fraction was in the range of 45% to 50%.  Images were inadequate for LV wall motion assessment. Features  are consistent with a pseudonormal left ventricular filling  pattern, with concomitant abnormal relaxation and increased  filling pressure (grade 2 diastolic dysfunction). Doppler  parameters are consistent with high ventricular filling pressure. - Aortic valve: Trileaflet; normal thickness, mildly calcified  leaflets. - Mitral valve: Calcified annulus. There was trivial regurgitation. - Left atrium: The  atrium was mildly dilated. - Recommendations: Recommend definity contrast study to rule out  apical thrombus. Consider transesophageal echocardiography if  clinically indicated in order to exclude intracardiac thrombus.  Impressions:  - No cardiac source of emboli was indentified.  Recommendations:  1. Recommend definity contrast study to rule out apical thrombus. 2. Consider transesophageal echocardiography if clinically  indicated in order to exclude intracardiac thrombus.       Assessment & Plan:

## 2015-10-14 NOTE — Patient Instructions (Addendum)
OK to increase the Aspirin to 325 mg per day  OK to re-start the amlodipine 5 mg per day  Ok to change the spiriva to atrovent inhaler  Please continue all other medications as before  Please have the pharmacy call with any other refills you may need.  Please continue your efforts at being more active, low cholesterol diet, and weight control.  You are otherwise up to date with prevention measures today.  Please keep your appointments with your specialists as you may have planned  You will be contacted regarding the referral for: infectious disease, cardiology, Dr Lissa Morales surgury  Please return in 3 months, or sooner if needed

## 2015-10-14 NOTE — Progress Notes (Signed)
Pre visit review using our clinic review tool, if applicable. No additional management support is needed unless otherwise documented below in the visit note. 

## 2015-10-15 ENCOUNTER — Other Ambulatory Visit: Payer: Self-pay | Admitting: Pharmacist

## 2015-10-15 NOTE — Patient Outreach (Signed)
[facility]) Care Management  10/15/2015  Brad [name] May 16, 1956 [id number]   [name] is a 60yo who was referred to [facility] for medication assistance.  I made outreach call to patient to follow up on PCP visit on 10/14/15.  Patient's phone was answered by his significant other who reports that patient is not home.  She reports patient is out picking up his prescriptions for amlodipine and aspirin 325 mg that were prescribed at PCP visit.  She requested that I try contacting the patient again on Monday.  Will make outreach call to patient on 10/18/15.     Elisabeth Most, Pharm.D. Pharmacy Resident [place] [phone]

## 2015-10-16 NOTE — Assessment & Plan Note (Signed)
stable overall by history and exam, recent data reviewed with pt, and pt to continue medical treatment as before,  to f/u any worsening symptoms or concerns SpO2 Readings from Last 3 Encounters:  10/14/15 97%  09/29/15 98%  09/21/15 98%

## 2015-10-16 NOTE — Assessment & Plan Note (Signed)
stable overall by history and exam, recent data reviewed with pt, and pt to continue medical treatment as before,  to f/u any worsening symptoms or concern Lab Results  Component Value Date   CHOL 182 09/29/2015   HDL 24* 09/29/2015   LDLCALC 86 09/29/2015   LDLDIRECT 92.0 06/09/2015   TRIG 359* 09/29/2015   CHOLHDL 7.6 09/29/2015

## 2015-10-16 NOTE — Assessment & Plan Note (Addendum)
Still needs to be addressed, not clear to me that he has been referred by the emr record so I will refer, pt agreeable, o/w increase the asa to 325 mg, cont plavix,  to f/u any worsening symptoms or concerns  Note:  Total time for pt hx, exam, review of record with pt in the room, determination of diagnoses and plan for further eval and tx is > 40 min, with over 50% spent in coordination and counseling of patient

## 2015-10-16 NOTE — Assessment & Plan Note (Signed)
stable overall by history and exam, recent data reviewed with pt, and pt to continue medical treatment as before,  to f/u any worsening symptoms or concerns BP Readings from Last 3 Encounters:  10/14/15 160/72  09/29/15 162/78  09/21/15 160/88

## 2015-10-16 NOTE — Assessment & Plan Note (Addendum)
Has mild reduced EF by recent echo - for cardiology referral, o/w cont current tx, also consider echo with CM to r/o apical source as rec' d prior to pt leaving AMA but wants to wait for card referral

## 2015-10-18 ENCOUNTER — Other Ambulatory Visit: Payer: Self-pay | Admitting: Pharmacist

## 2015-10-18 NOTE — Patient Outreach (Signed)
[facility]) Care Management  [place]   10/18/2015  Brad [name] [birth year] [id number]  Subjective: Brad [name] is a 60yo who was referred to [facility] for medication assistance.  I made outreach call to patient to follow up on PCP visit on 10/14/15.  Patient confirms he picked up his amlodipine refill and picked up aspirin 325 mg from the pharmacy.  Patient states he received another letter from his insurance company reporting that they covered Spiriva this month but that it will not be covered in the future.  Patient reports his PCP changed his Spiriva to Atrovent HFA.  Patient reports he plans to use his current Spiriva inhaler until it is gone then switch to the Atrovent HFA.  I counseled patient on purpose and proper use of Atrovent HFA.    Objective:   Current Medications: Current Outpatient Prescriptions  Medication Sig Dispense Refill  . amLODipine (NORVASC) 5 MG tablet Take 1 tablet (5 mg total) by mouth daily. 90 tablet 3  . aspirin EC 325 MG tablet Take 1 tablet (325 mg total) by mouth daily. 30 tablet 0  . ferrous sulfate 325 (65 FE) MG tablet Take 1 tablet (325 mg total) by mouth 3 (three) times daily with meals. 30 tablet 3  . furosemide (LASIX) 40 MG tablet 1 tab by mouth in the AM, with a second dose in PM as needed only for swelling (Patient taking differently: Take 40 mg by mouth daily. Can take second dose in evening as needed for swelling) 60 tablet 11  . glucose blood test strip 1 each by Other route See admin instructions. Reported on 09/07/2015    . ipratropium-albuterol (DUONEB) 0.5-2.5 (3) MG/3ML SOLN USE 3 ML VIA NEBULIZER EVERY 6 HOURS AS NEEDED 3240 mL 3  . Lancets MISC 1 each by Other route See admin instructions. Reported on 09/07/2015    . levothyroxine (SYNTHROID, LEVOTHROID) 112 MCG tablet Take 1 tablet (112 mcg total) by mouth daily. 90 tablet 3  . lisinopril (PRINIVIL,ZESTRIL) 20 MG tablet Take 1 tablet (20 mg total) by  mouth daily. 90 tablet 0  . metFORMIN (GLUCOPHAGE-XR) 500 MG 24 hr tablet 3 tabs by mouth in the AM (Patient taking differently: Take 1,500 mg by mouth daily with breakfast. Only taking 2 tablets) 270 tablet 3  . simvastatin (ZOCOR) 40 MG tablet Take 1 tablet (40 mg total) by mouth at bedtime. 90 tablet 0  . tiZANidine (ZANAFLEX) 4 MG tablet TAKE 1 TABLET BY MOUTH EVERY 6 HOURS AS NEEDED FOR MUSCLE SPASMS 60 tablet 0  . traMADol (ULTRAM) 50 MG tablet TAKE 1-2 TABLETS BY MOUTH UP TO FOUR TIMES DAILY 120 tablet 5  . Blood Pressure Monitor DEVI Use to check Blood pressure daily 1 Device 0  . ipratropium (ATROVENT HFA) 17 MCG/ACT inhaler Inhale 2 puffs into the lungs 4 (four) times daily. 3 Inhaler 3  . PROAIR HFA 108 (90 BASE) MCG/ACT inhaler INHALE 2 PUFFS BY MOUTH EVERY 6 HOURS AS NEEDED FOR WHEEZING OR SHORTNESS OF BREATH (Patient not taking: Reported on 10/18/2015) 8.5 g 11  . Respiratory Therapy Supplies (NEBULIZER) DEVI Use as directed four times daily (Patient not taking: Reported on 10/18/2015) 1 each 0   No current facility-administered medications for this visit.    Functional Status: In your present state of health, do you have any difficulty performing the following activities: 09/27/2015 09/21/2015  Hearing? Tempie Donning  Vision? N Y  Difficulty concentrating or making decisions? N  N  Walking or climbing stairs? Y Y  Dressing or bathing? N N  Doing errands, shopping? N N  Preparing Food and eating ? - N  Using the Toilet? - N  In the past six months, have you accidently leaked urine? - N  Do you have problems with loss of bowel control? - N  Managing your Medications? - N  Managing your Finances? - N  Housekeeping or managing your Housekeeping? - N    Fall/Depression Screening: PHQ 2/9 Scores 09/21/2015 06/14/2015  PHQ - 2 Score 0 0    Assessment: 1.  Medication assistance:  Patient receives full Extra Help for his medications through social security.  Patient reports his Spiriva is no  longer covered by his insurance.  Patient reports his Spiriva was changed by his PCP to Atrovent HFA.  Patient reports the Atrovent HFA has copay of $8 at [facility] and patient confirms he will be able to afford Atrovent copay.    2.  Medication adherence:  Reviewed medications with patient and discussed purpose, proper use, and adverse effects.  Patient reports adherence with his mediations at this time and denies any problems remembering to take his medications.  Patient denies any questions regarding his medications.  Reinforced importance of continued adherence with all medications.     Plan: 1.  Patient to continue to take all medications as prescribed.   2.  Will close pharmacy program as patient denies any further pharmacy needs and no further intervention needed.  Will update [place].  Encouraged patient to contact me if he has any questions or concerns with medication cost in the future.     Elisabeth Most, Pharm.D. Pharmacy Resident [place] [phone]

## 2015-10-19 ENCOUNTER — Other Ambulatory Visit: Payer: Self-pay | Admitting: *Deleted

## 2015-10-19 NOTE — Patient Outreach (Signed)
[facility]) Care Management   10/19/2015  Brad [name] 05/10/1956 [id number]  Brad [name] is an 60 y.o. male  Subjective:   Member reports that he is doing "pretty good."  He state that has had some continued dizziness intermittently, but state that it's usually when he is looking up for a while (has history of blocked carotid arteries).  He reports taking all medications as prescribed, although he reports his legs feeling heavy and hurting since starting back on Simvistatin.  He states this has happened before with another statin.    Objective:   Review of Systems  Constitutional: Negative.   HENT: Negative.   Eyes: Negative.   Respiratory: Negative.   Cardiovascular: Negative.   Gastrointestinal: Negative.   Genitourinary: Negative.   Musculoskeletal: Positive for joint pain.  Skin: Negative.   Neurological: Negative.   Endo/Heme/Allergies: Negative.   Psychiatric/Behavioral: Negative.     Physical Exam  Constitutional: He is oriented to person, place, and time. He appears well-developed and well-nourished.  Neck: Normal range of motion.  Cardiovascular: Normal rate, regular rhythm and normal heart sounds.   Respiratory: Effort normal and breath sounds normal.  GI: Soft. Bowel sounds are normal.  Musculoskeletal: Normal range of motion.  Neurological: He is alert and oriented to person, place, and time.  Skin: Skin is warm and dry.    BP 159/73 mmHg  Pulse 87  Resp 20  SpO2 97%   Current Medications:   Current Outpatient Prescriptions  Medication Sig Dispense Refill  . amLODipine (NORVASC) 5 MG tablet Take 1 tablet (5 mg total) by mouth daily. 90 tablet 3  . aspirin EC 325 MG tablet Take 1 tablet (325 mg total) by mouth daily. 30 tablet 0  . Blood Pressure Monitor DEVI Use to check Blood pressure daily 1 Device 0  . ferrous sulfate 325 (65 FE) MG tablet Take 1 tablet (325 mg total) by mouth 3 (three) times daily with meals. 30 tablet 3   . furosemide (LASIX) 40 MG tablet 1 tab by mouth in the AM, with a second dose in PM as needed only for swelling (Patient taking differently: Take 40 mg by mouth daily. Can take second dose in evening as needed for swelling) 60 tablet 11  . glucose blood test strip 1 each by Other route See admin instructions. Reported on 09/07/2015    . Lancets MISC 1 each by Other route See admin instructions. Reported on 09/07/2015    . levothyroxine (SYNTHROID, LEVOTHROID) 112 MCG tablet Take 1 tablet (112 mcg total) by mouth daily. 90 tablet 3  . lisinopril (PRINIVIL,ZESTRIL) 20 MG tablet Take 1 tablet (20 mg total) by mouth daily. 90 tablet 0  . metFORMIN (GLUCOPHAGE-XR) 500 MG 24 hr tablet 3 tabs by mouth in the AM (Patient taking differently: Take 1,500 mg by mouth daily with breakfast. Only taking 2 tablets) 270 tablet 3  . simvastatin (ZOCOR) 40 MG tablet Take 1 tablet (40 mg total) by mouth at bedtime. 90 tablet 0  . tiZANidine (ZANAFLEX) 4 MG tablet TAKE 1 TABLET BY MOUTH EVERY 6 HOURS AS NEEDED FOR MUSCLE SPASMS 60 tablet 0  . traMADol (ULTRAM) 50 MG tablet TAKE 1-2 TABLETS BY MOUTH UP TO FOUR TIMES DAILY 120 tablet 5  . ipratropium (ATROVENT HFA) 17 MCG/ACT inhaler Inhale 2 puffs into the lungs 4 (four) times daily. (Patient not taking: Reported on 10/19/2015) 3 Inhaler 3  . ipratropium-albuterol (DUONEB) 0.5-2.5 (3) MG/3ML SOLN USE 3 ML VIA  NEBULIZER EVERY 6 HOURS AS NEEDED (Patient not taking: Reported on 10/19/2015) 3240 mL 3  . PROAIR HFA 108 (90 BASE) MCG/ACT inhaler INHALE 2 PUFFS BY MOUTH EVERY 6 HOURS AS NEEDED FOR WHEEZING OR SHORTNESS OF BREATH (Patient not taking: Reported on 10/18/2015) 8.5 g 11  . Respiratory Therapy Supplies (NEBULIZER) DEVI Use as directed four times daily (Patient not taking: Reported on 10/18/2015) 1 each 0   No current facility-administered medications for this visit.    Functional Status:   In your present state of health, do you have any difficulty performing the  following activities: 09/27/2015 09/21/2015  Hearing? Malvin Johns  Vision? N Y  Difficulty concentrating or making decisions? N N  Walking or climbing stairs? Y Y  Dressing or bathing? N N  Doing errands, shopping? N N  Preparing Food and eating ? - N  Using the Toilet? - N  In the past six months, have you accidently leaked urine? - N  Do you have problems with loss of bowel control? - N  Managing your Medications? - N  Managing your Finances? - N  Housekeeping or managing your Housekeeping? - N    Fall/Depression Screening:    PHQ 2/9 Scores 09/21/2015 06/14/2015  PHQ - 2 Score 0 0    Assessment:    Member provided with scale and blood pressure monitor.  Educated on correct use, return demonstration noted.  He has already started recording his daily blood sugar checks, and state that he will also begin to check his weight and blood pressure daily.  He has follow up appointments with cardiology, vascular, and neurology all within the next few weeks.  He denies any questions or needs, states he does not need transportation.  Encouraged to discuss side effects of Simvistatin with cardiologist in hopes of finding a medication without his specific side effect.  He verbalizes understanding.    He has not gotten his nebulizer machine yet, stating that he still has to call other resources previously provided.  He also does not have his new inhaler (Atrovent), stating that he will obtain when he is completely out of Spiriva.  Importance of obtaining medication and machine discussed as it relates to management of COPD, he verbalizes understanding.  He does report a decrease in smoking, but he has not quit completely.    Member denies any further needs or concerns.  Encouraged to contact with questions.  Plan:   Will continue with transition of care calls next week.  THN CM Care Plan Problem One        Most Recent Value   Care Plan Problem One  Knowledge deficit regarding COPD self care management    Role Documenting the Problem One  Care Management Coordinator   Care Plan for Problem One  Active   THN Long Term Goal (31-90 days)  Member will be able to report 3 ways to better manage COPD within the next 31 days   THN Long Term Goal Start Date  09/21/15   [facility] Long Term Goal Met Date  10/19/15   Interventions for Problem One Long Term Goal  Discussed the effect smoking has on COPD and the benefits of exercise, the importance of using inhaler & nebulizer, pharmacy referral complete to investigate alternative for Spiriva   THN CM Short Term Goal #1 (0-30 days)  Member will have nebulizer machine within the next 4 weeks   THN CM Short Term Goal #1 Start Date  09/21/15   [facility] CM  Short Term Goal #1 Met Date  -- [Goal not met]   Interventions for Short Term Goal #1  Discussed the effect smoking has on COPD and the benefits of exercise, the importance of using inhaler & nebulizer, pharmacy referral complete to investigate alternative for Spiriva.  Provided with a list of medical supply companies in [place] to obtain machine.   THN CM Short Term Goal #2 (0-30 days)  Member will report a decrease in smoking within the next 4 weeks   THN CM Short Term Goal #2 Start Date  09/21/15   [facility] CM Short Term Goal #2 Met Date  10/19/15   Interventions for Short Term Goal #2  Discussed the effect smoking has on COPD and the benefits of exercise, the importance of using inhaler & nebulizer, pharmacy referral complete to investigate alternative for Spiriva   THN CM Short Term Goal #3 (0-30 days)  Member will report increase in exercise within the next 4 weeks   THN CM Short Term Goal #3 Start Date  09/21/15   Interventions for Short Tern Goal #3  Discussed the effect smoking has on COPD and the benefits of exercise, the importance of using inhaler & nebulizer, pharmacy referral complete to investigate alternative for Spiriva    [facility] CM Care Plan Problem Two        Most Recent Value   Care Plan Problem Two   Medication management   Role Documenting the Problem Two  Care Management Coordinator   Care Plan for Problem Two  Active   THN CM Short Term Goal #1 (0-30 days)  Member will have all medications from pharmacy within the next 2 weeks (Lisinopril and Simvistatin)   THN CM Short Term Goal #1 Start Date  09/21/15   [facility] CM Short Term Goal #1 Met Date   10/19/15   Interventions for Short Term Goal #2   Call placed to pharmacy to refill missing medications (Lisinopril & Simvistatin)   THN CM Short Term Goal #2 (0-30 days)  Member will reports taking all medications as prescribed within the next 4 weeks   THN CM Short Term Goal #2 Start Date  09/21/15   [facility] CM Short Term Goal #2 Met Date  10/19/15   Interventions for Short Term Goal #2  Discussed importance of taking all meds as prescribed in effort to manage all conditions (COPD, Diabetes, Hypertension).  Provided with pill box, explanation provided for use of all meds, re-educated on correct administration of all meds    [facility] CM Care Plan Problem Three        Most Recent Value   Care Plan Problem Three  Recent hospitalization   Role Documenting the Problem Three  Care Management [place] for Problem Three  Active   THN Long Term Goal (31-90) days  Member will not be readmitted to hospital within the next 31 days   [employer] Term Goal Start Date  10/01/15   Interventions for Problem Three Long Term Goal  Discussed with member the importance of following discharge instructions, including follow up appointments, medications, diet, to decrease the risk of readmission     [name], BSN, [place] Manager [phone]

## 2015-10-21 ENCOUNTER — Ambulatory Visit (INDEPENDENT_AMBULATORY_CARE_PROVIDER_SITE_OTHER): Payer: Medicare Other | Admitting: Cardiology

## 2015-10-21 ENCOUNTER — Encounter: Payer: Self-pay | Admitting: Cardiology

## 2015-10-21 VITALS — BP 160/60 | HR 82 | Ht 71.0 in | Wt 280.8 lb

## 2015-10-21 DIAGNOSIS — R0789 Other chest pain: Secondary | ICD-10-CM

## 2015-10-21 DIAGNOSIS — Z889 Allergy status to unspecified drugs, medicaments and biological substances status: Secondary | ICD-10-CM

## 2015-10-21 DIAGNOSIS — I519 Heart disease, unspecified: Secondary | ICD-10-CM

## 2015-10-21 DIAGNOSIS — I739 Peripheral vascular disease, unspecified: Secondary | ICD-10-CM

## 2015-10-21 DIAGNOSIS — Z789 Other specified health status: Secondary | ICD-10-CM

## 2015-10-21 DIAGNOSIS — R0600 Dyspnea, unspecified: Secondary | ICD-10-CM | POA: Diagnosis not present

## 2015-10-21 DIAGNOSIS — I110 Hypertensive heart disease with heart failure: Secondary | ICD-10-CM

## 2015-10-21 DIAGNOSIS — I779 Disorder of arteries and arterioles, unspecified: Secondary | ICD-10-CM

## 2015-10-21 DIAGNOSIS — E785 Hyperlipidemia, unspecified: Secondary | ICD-10-CM

## 2015-10-21 MED ORDER — METOPROLOL SUCCINATE ER 50 MG PO TB24: 50.0000 mg | ORAL_TABLET | Freq: Every day | ORAL | Status: DC

## 2015-10-21 NOTE — Patient Instructions (Signed)
Medication Instructions:  1) START Metoprolol Succinate '50mg'$  once daily   Labwork: None  Testing/Procedures: None  Follow-Up: Your physician recommends that you schedule a follow-up appointment in: 2 months with Dr. Marlou Porch.   Any Other Special Instructions Will Be Listed Below (If Applicable).     If you need a refill on your cardiac medications before your next appointment, please call your pharmacy.

## 2015-10-21 NOTE — Progress Notes (Signed)
Cardiology Office Note    Date:  10/21/2015   ID:  ABIMAEL ZEITER, DOB 1956-07-14, MRN [id number]  PCP:  [name], MD  Cardiologist:   Candee Furbish, MD     History of Present Illness:  Brad [name] is a 60 y.o. male with mildly reduced ejection fraction here for evaluation of cardiomyopathy.  He is a history of COPD, hypertension, stroke, carotid stenosis, followed by Dr. Trula Slade status post endarterectomy.  Ejection fraction was 45-50% on echocardiogram with grade 2 diastolic dysfunction.  He was hospitalized from 09/27/15-09/29/15 with stroke, MRI demonstrating acute ischemic component in the right basal ganglia. Transient left-sided weakness/stroke related to moderate proximal right carotid stenosis. He left AGAINST MEDICAL ADVICE.   He has an appointment coming up with Dr. Trula Slade. Right-sided carotid endarterectomy will be performed.  He has noted increased weight gain, lower extremity edema. His ejection fraction is only mildly reduced. He is drinking quite a bit of fluid. Salt intake. We discussed.  Occasionally he will have fleeting chest discomfort left upper chest nonexertional. He does not do much walking. He states that when he takes his statins, he will get some burning in his legs and this stops when he stops that medication. He also thinks that his edema has worsened since starting his statin therapy.      Past Medical History  Diagnosis Date  . Depression   . GERD (gastroesophageal reflux disease)   . Hyperlipidemia 08/27/2014    takes Simvastatin daily  . Hypertension     takes Amlodipine and Lisinopril daily  . Hypothyroidism 08/27/2014    takes Synthroid daily  . Anemia     takes Ferrous Sulfated daily  . Carotid stenosis 08/27/2014    takes Aspirin daily  . Diabetes mellitus type II     takes Metformin and Glipizide daily  . Muscle spasm     takes Zanaflex daily as needed  . Insomnia     takes Pamelor nightly  . COPD (chronic obstructive  pulmonary disease) (HCC)     Albuterol daily as needed and Spiriva daily  . MVP (mitral valve prolapse)   . History of blood clots     in right leg;being followed by Dr.Brabham for this  . Shortness of breath dyspnea   . Peripheral neuropathy ([place])   . Headache     occasionally  . Dizziness   . Stroke ([place])   . Arthritis   . Joint pain     hips  . Peripheral edema     takes Furosemide daily as needed  . History of hiatal hernia   . History of gastric ulcer   . Urinary frequency   . Urinary urgency   . History of blood transfusion 09/2014    2 units-no abnormal reaction noted  . HTN (hypertension) 09/14/2015  . Chronic hepatitis C ([place]) 09/17/2015  . Prurigo nodularis     Past Surgical History  Procedure Laterality Date  . Neg stress test  2000  . Rotator cuff repair Right 2009    multiple f/u Sxs/infection Tx  . Incision and drainage perirectal abscess  03/2009  . Esophagogastroduodenoscopy N/A 09/24/2014    Procedure: ESOPHAGOGASTRODUODENOSCOPY (EGD);  Surgeon: Winfield Cunas., MD;  Location: [facility] ENDOSCOPY;  Service: Endoscopy;  Laterality: N/A;  . I&d of right arm    . Endarterectomy Left 11/13/2014    Procedure: ENDARTERECTOMY CAROTID WITH PATCH ANGIOPLASTY;  Surgeon: Serafina Mitchell, MD;  Location: [place];  Service: Vascular;  Laterality: Left;    Outpatient Prescriptions Prior to Visit  Medication Sig Dispense Refill  . amLODipine (NORVASC) 5 MG tablet Take 1 tablet (5 mg total) by mouth daily. 90 tablet 3  . aspirin EC 325 MG tablet Take 1 tablet (325 mg total) by mouth daily. 30 tablet 0  . Blood Pressure Monitor DEVI Use to check Blood pressure daily 1 Device 0  . ferrous sulfate 325 (65 FE) MG tablet Take 1 tablet (325 mg total) by mouth 3 (three) times daily with meals. 30 tablet 3  . furosemide (LASIX) 40 MG tablet 1 tab by mouth in the AM, with a second dose in PM as needed only for swelling (Patient taking differently: Take 40 mg by mouth daily. Can take second  dose in evening as needed for swelling) 60 tablet 11  . glucose blood test strip 1 each by Other route See admin instructions. Reported on 09/07/2015    . ipratropium (ATROVENT HFA) 17 MCG/ACT inhaler Inhale 2 puffs into the lungs 4 (four) times daily. 3 Inhaler 3  . Lancets MISC 1 each by Other route See admin instructions. Reported on 09/07/2015    . levothyroxine (SYNTHROID, LEVOTHROID) 112 MCG tablet Take 1 tablet (112 mcg total) by mouth daily. 90 tablet 3  . lisinopril (PRINIVIL,ZESTRIL) 20 MG tablet Take 1 tablet (20 mg total) by mouth daily. 90 tablet 0  . metFORMIN (GLUCOPHAGE-XR) 500 MG 24 hr tablet 3 tabs by mouth in the AM (Patient taking differently: Take 1,500 mg by mouth daily with breakfast. Only taking 2 tablets) 270 tablet 3  . PROAIR HFA 108 (90 BASE) MCG/ACT inhaler INHALE 2 PUFFS BY MOUTH EVERY 6 HOURS AS NEEDED FOR WHEEZING OR SHORTNESS OF BREATH 8.5 g 11  . Respiratory Therapy Supplies (NEBULIZER) DEVI Use as directed four times daily 1 each 0  . tiZANidine (ZANAFLEX) 4 MG tablet TAKE 1 TABLET BY MOUTH EVERY 6 HOURS AS NEEDED FOR MUSCLE SPASMS 60 tablet 0  . traMADol (ULTRAM) 50 MG tablet TAKE 1-2 TABLETS BY MOUTH UP TO FOUR TIMES DAILY 120 tablet 5  . ipratropium-albuterol (DUONEB) 0.5-2.5 (3) MG/3ML SOLN USE 3 ML VIA NEBULIZER EVERY 6 HOURS AS NEEDED (Patient not taking: Reported on 10/19/2015) 3240 mL 3  . simvastatin (ZOCOR) 40 MG tablet Take 1 tablet (40 mg total) by mouth at bedtime. 90 tablet 0   No facility-administered medications prior to visit.     Allergies:   Doxycycline; Lipitor; and Oxycodone   Social History   Social History  . Marital Status: Single    Spouse Name: N/A  . Number of Children: 0  . Years of Education: N/A   Occupational History  . Disabled    Social History Main Topics  . Smoking status: Current Every Day Smoker -- 2.00 packs/day for 48 years    Types: Cigarettes  . Smokeless tobacco: Never Used     Comment: 1-2 pks per day  .  Alcohol Use: Yes     Comment: beer monthly  . Drug Use: No  . Sexual Activity: Yes   Other Topics Concern  . None   Social History Narrative   Divorced with fiance.   Disabled since June 2010 - right shoulder   No children.           Family History:  The patient's family history includes Alcohol abuse in his father; Diabetes type II in his mother; Heart disease in his mother; Lung cancer in his father, maternal grandmother, and  sister.   ROS:   Please see the history of present illness.    ROS All other systems reviewed and are negative.   PHYSICAL EXAM:   VS:  BP 160/60 mmHg  Pulse 82  Ht '5\' 11"'$  (1.803 m)  Wt 280 lb 12.8 oz (127.37 kg)  BMI 39.18 kg/m2  SpO2 97%   GEN: Well nourished, well developed, in no acute distress HEENT: normal Neck: no JVD, carotid bruits, or masses Cardiac: RRR; no murmurs, rubs, or gallops,no edema  Respiratory:  clear to auscultation bilaterally, normal work of breathing GI: soft, nontender, nondistended, + BS MS: no deformity or atrophy Skin: warm and dry, no rash, lower extremity edema Neuro:  Alert and Oriented x 3, Strength and sensation are intact Psych: euthymic mood, full affect  Wt Readings from Last 3 Encounters:  10/21/15 280 lb 12.8 oz (127.37 kg)  10/14/15 279 lb (126.554 kg)  09/27/15 263 lb 12.8 oz (119.659 kg)      Studies/Labs Reviewed:   EKG:  EKG is bot ordered today.  Prior EKG from 09/27/15 shows T-wave inversion in the lateral precordial leads. Sometimes this is seen in stroke. Personally viewed. Prior EKG reviewed, no evidence of T-wave inversion.  Recent Labs: 09/27/2015: ALT 30; TSH 6.651* 09/28/2015: Hemoglobin 13.9; Platelets 223 09/29/2015: BUN 19; Creatinine, Ser 1.18; Potassium 4.4; Sodium 140   Lipid Panel    Component Value Date/Time   CHOL 182 09/29/2015 0530   TRIG 359* 09/29/2015 0530   HDL 24* 09/29/2015 0530   CHOLHDL 7.6 09/29/2015 0530   VLDL 72* 09/29/2015 0530   LDLCALC 86 09/29/2015  0530   LDLDIRECT 92.0 06/09/2015 0842    Additional studies/ records that were reviewed today include:      ASSESSMENT:    1. Statin intolerance   2. Dyspnea   3. Other chest pain   4. Left ventricular systolic dysfunction   5. Hypertensive heart disease with heart failure ([place])   6. Peripheral vascular disease ([place])   7. Bilateral carotid artery disease ([place])   8. Morbid obesity due to excess calories ([place])   9. Hyperlipidemia      PLAN:  In order of problems listed above:  Chest pain -Certainly with his peripheral vascular disease, he may have underlying coronary artery disease. A few years ago he underwent nuclear stress test prior to his carotid endarterectomy and was told that this was low risk. He recalls that he was asked to stop smoking at that time and he had a witty come back for the person that asked him to stop smoking. If his chest pain worsens or becomes more worrisome, we will consider further testingEspecially given his T-wave changes on EKG. He could consider  Right carotid artery stenosis -Dr. Trula Slade, and review of telephone note from 09/28/15, plans for right carotid endarterectomy  Left ventricular systolic dysfunction -Ejection fraction 45%, personally viewed echocardiogram, mildly reduced. -Continue with ACE inhibitor. -I will add beta blocker, Toprol-XL 50 mg to his drug regimen.  Hypertensive heart disease with heart failure -If Toprol works, you may wish to discontinue his amlodipine 5 mg which as a side effect can worsen his lower extremity edema.  Lower extremity edema -There is quite a bit of fluid. Mostly dependent edema. I told him that over the next 3 days he should take an extra Lasix drip he is also drinking quite a bit of fluid on a daily basis including Gatorade, green tea etc. Also asked him to limit his salt intake.  I explained to him the physiology behind this. Raise his legs when possible.  Morbid obesity -Encourage weight  loss.  Hyperlipidemia-statin intolerance -He remembers being on 2 different types of statins but he does not know their names. He has not liked taking his simvastatin. Potentially try atorvastatin. One could try Crestor as well. Compliance has been an issue.    Medication Adjustments/Labs and Tests Ordered: Current medicines are reviewed at length with the patient today.  Concerns regarding medicines are outlined above.  Medication changes, Labs and Tests ordered today are listed in the Patient Instructions below. Patient Instructions  Medication Instructions:  1) START Metoprolol Succinate '50mg'$  once daily   Labwork: None  Testing/Procedures: None  Follow-Up: Your physician recommends that you schedule a follow-up appointment in: 2 months with Dr. Marlou Porch.   Any Other Special Instructions Will Be Listed Below (If Applicable).     If you need a refill on your cardiac medications before your next appointment, please call your pharmacy.         Brad Rumpf, MD  10/21/2015 5:11 PM    [facility] [place], [place], [place]  [address] Phone: [phone]; Fax: [phone]

## 2015-10-25 ENCOUNTER — Encounter: Payer: Self-pay | Admitting: Surgery

## 2015-10-28 ENCOUNTER — Other Ambulatory Visit: Payer: Self-pay | Admitting: *Deleted

## 2015-10-28 NOTE — Patient Outreach (Signed)
Weekly transition of care call placed to member.  Member states "I ain't feeling worth a damn."  He reports having cough, congestion and fever of 100 for almost a week now.  He states that he will feel better one day but worse the next. He reports already having a flu shot this season. He denies contacting his PCP for an appointment, stating that his car is broken down and he and his significant other are using a friend's care for transportation.  He state that she is in school and has the car during the week for class.    Attempted to discuss again the importance of being evaluated, especially with having a fever, but member begins to talk about his blood pressure readings.  He reports his systolic to be between 521V-471T.  He state that he has not been out of the bed today, and was barely up yesterday.  Advised again to seek medical attention, either urgent care, emergency department, or PCP office visit.  He states "I'll be all right, I'm going back to sleep."  He thanks this care manager for checking on him and ends the call.  Will continue with transition of care calls next week.  [name], BSN, [place] Management  [facility] Care Manager [phone]

## 2015-10-29 ENCOUNTER — Ambulatory Visit: Payer: Medicare Other | Admitting: Surgery

## 2015-11-02 ENCOUNTER — Other Ambulatory Visit: Payer: Self-pay | Admitting: *Deleted

## 2015-11-02 NOTE — Patient Outreach (Signed)
Weekly transition of care call placed to member.  He reports that he is feeling better than last week, denies fever, reports decreased cough.  He states "I got my taste buds back."  He denies going to be evaluated by his PCP, urgent care, or emergency department.  He reports continued pain in his neck, stating that he was to have an appointment on Friday with the vascular physician, but missed it due to him not feeling well.  Encouraged to reschedule as soon as possible.  Member denies any signs/symptoms of stroke, reports blood pressure has been "good" ranging 130s/60-70s.  He has a follow up appointment with neurology on 4/6.  He states that his car is still broken down, but he still has his friend's car right now.    Discussed with member ongoing self care management, daily weights, blood pressure checks, blood sugar checks, smoking cessation.  He verbalizes the importance of the above, but also state "some of this stuff like doctor appointments will just have to wait.  I got too much going on.  I just need to feel a little better."  Discussed the role that regular physician appointments play in health maintenance, member verbalizes understanding.  Member made aware that he has complete his transition of care program, he is open to continue involvement with this care manager.  Routine home visit scheduled for next month.  [name], BSN, [place] Management  [facility] Care Manager [phone]

## 2015-11-03 ENCOUNTER — Ambulatory Visit: Payer: Medicare Other | Admitting: *Deleted

## 2015-11-08 ENCOUNTER — Other Ambulatory Visit: Payer: Self-pay | Admitting: Internal Medicine

## 2015-11-09 ENCOUNTER — Other Ambulatory Visit: Payer: Medicare Other

## 2015-11-09 DIAGNOSIS — B182 Chronic viral hepatitis C: Secondary | ICD-10-CM

## 2015-11-09 LAB — IRON: Iron: 22 ug/dL — ABNORMAL LOW (ref 50–180)

## 2015-11-10 LAB — ANTI-NUCLEAR AB-TITER (ANA TITER): ANA Titer 1: 1:160 {titer} — ABNORMAL HIGH

## 2015-11-10 LAB — HEPATITIS B SURFACE ANTIBODY,QUALITATIVE: Hep B S Ab: NEGATIVE

## 2015-11-10 LAB — HEPATITIS A ANTIBODY, TOTAL: Hep A Total Ab: REACTIVE — AB

## 2015-11-10 LAB — ANA: Anti Nuclear Antibody(ANA): POSITIVE — AB

## 2015-11-10 LAB — HEPATITIS B SURFACE ANTIGEN: Hepatitis B Surface Ag: NEGATIVE

## 2015-11-10 LAB — HEPATITIS B CORE ANTIBODY, TOTAL: Hep B Core Total Ab: NONREACTIVE

## 2015-11-10 LAB — HIV ANTIBODY (ROUTINE TESTING W REFLEX): HIV 1&2 Ab, 4th Generation: NONREACTIVE

## 2015-11-11 ENCOUNTER — Other Ambulatory Visit: Payer: Self-pay | Admitting: Neurology

## 2015-11-11 ENCOUNTER — Encounter: Payer: Self-pay | Admitting: Neurology

## 2015-11-11 ENCOUNTER — Ambulatory Visit (INDEPENDENT_AMBULATORY_CARE_PROVIDER_SITE_OTHER): Payer: Medicare Other | Admitting: Neurology

## 2015-11-11 VITALS — BP 147/65 | HR 73 | Ht 71.0 in | Wt 170.4 lb

## 2015-11-11 DIAGNOSIS — E785 Hyperlipidemia, unspecified: Secondary | ICD-10-CM | POA: Diagnosis not present

## 2015-11-11 DIAGNOSIS — E1159 Type 2 diabetes mellitus with other circulatory complications: Secondary | ICD-10-CM

## 2015-11-11 DIAGNOSIS — I6521 Occlusion and stenosis of right carotid artery: Secondary | ICD-10-CM

## 2015-11-11 DIAGNOSIS — Z9889 Other specified postprocedural states: Secondary | ICD-10-CM | POA: Diagnosis not present

## 2015-11-11 DIAGNOSIS — I639 Cerebral infarction, unspecified: Secondary | ICD-10-CM

## 2015-11-11 DIAGNOSIS — I10 Essential (primary) hypertension: Secondary | ICD-10-CM | POA: Diagnosis not present

## 2015-11-11 MED ORDER — CLOPIDOGREL BISULFATE 75 MG PO TABS: 75.0000 mg | ORAL_TABLET | Freq: Every day | ORAL | Status: DC

## 2015-11-11 NOTE — Patient Instructions (Addendum)
-   continue ASA for stroke prevention - will re-start plavix for stroke and cardiac prevention - make appointment with Dr. Trula Slade ASAP to have right carotid surgery - check BP at home and BP goal 130-150 due to right carotid high grade stenosis - check glucose at home - quit smoking - Follow up with your primary care physician for stroke risk factor modification. Recommend maintain blood pressure goal <130-150/80, diabetes with hemoglobin A1c goal below 6.5% and lipids with LDL cholesterol goal below 70 mg/dL.  - follow up in 2 months.

## 2015-11-12 DIAGNOSIS — E785 Hyperlipidemia, unspecified: Secondary | ICD-10-CM | POA: Insufficient documentation

## 2015-11-12 DIAGNOSIS — Z9889 Other specified postprocedural states: Secondary | ICD-10-CM | POA: Insufficient documentation

## 2015-11-12 NOTE — Progress Notes (Signed)
STROKE NEUROLOGY FOLLOW UP NOTE  NAME: Brad [name] DOB: 08-30-1955  REASON FOR VISIT: stroke follow up HISTORY FROM: chart and pt  Today we had the pleasure of seeing Brad [name] in follow-up at our [facility]. Pt was accompanied by wife.   History Summary Brad [name] is a 60 y.o. male with hx of HTN, DM, PVD, COPD and smoker was admitted 02/2014 for vertigo, right facial and hand numbness, and slurred speech. MRI showed questionable 2 mm acute left pontine infarct versus artifact. MRA showed right VA severe stenosis and possible occlusion. Strokes felt secondary to small vessel disease. CUS showed right 1-39% and the left 60-79% ICA stenosis, considered both asymptomatic sides. No VVS consult made. He was discharged on ASA and continued on cilostazol, as well as lipitor. He was encouraged to quit smoking.  05/25/14 follow up - the patient has been doing well, no recurrent symptoms. However, he has not quit smoking, in fact, his wife also smokes at home and made him difficult to quit. He takes medicatons but he did not watch his diet, not check BP at home. Complains of headache at back of the head, R>L, location consistent with occipital neuralgia. Pt stated that he compliant with medication since discharge.  09/23/14 admission - pt was admitted for slurry speech, left hemiparesis and left facial droop, but symptoms quickly resolved. MRI no acute infarct, MRA unremarkable. However, CUS showed worsening ICA stenosis with right 60-79% and left 80-99% stenosis. CTA neck done showed bilateral 70-80% ICA stenosis with left worse than right. LDL 63 and A1C 6.9. He has not quit smoking. VVS consulted and CEA planned. He was kept on ASA and zocor.   11/13/14 admission - had LEFT CEA with bovine pericardial patch angioplasty. by Dr. Trula Slade without complications.  09/27/15 admission - due to transient left-sided weakness. MRI showed right basal ganglia infarct, right CR infarct  as well as left occipital pole infarct. MRA neck showed right ICA high grade stenosis and CUS showed right ICA 60-79% stenosis and left patent. LDL 86 and A1C 7.5. His stroke likely symptomatic from proximal right carotid stenosis. He was put on aspirin 325 mg daily and clopidogrel 75 mg daily for secondary stroke prevention. VVS consulted and plan for right CEA in 1-2 weeks.   Interval History During the interval time, he was doing OK. He was asked to have ENT consult for vocal cord evaluation in preparation of right CEA. He was then scheduled to see Dr. Trula Slade on 10/29/14 but he cancelled appointment due to family issue. So far his right CEA has not done yet. He follows with ID for hepC. His BP today in clinic 152/65. He is on ASA but not statin as he can not tolerate statin. He also stopped plavix due to leg swelling. He complains of left shoulder pain, neck pain.   REVIEW OF SYSTEMS: Full 14 system review of systems performed and notable only for those listed below and in HPI above, all others are negative:  Constitutional: appetite change, fatigue, fever, unexpected wt change  Cardiovascular: leg swelling  Ear/Nose/Throat: ringing in ears  Skin:  Eyes: blurry vision, light sensitivity  Respiratory: cough, wheezing  Gastroitestinal: N/V  Genitourinary: difficulty urinating, bladder incontinence, urgency Hematology/Lymphatic: bleeding easily   Endocrine: cold/heat intolerance Musculoskeletal: aching muscles, joint pain, joint swelling, muscle cramps, walking difficulty, neck pain, neck stiffness  Allergy/Immunology: frequent infection  Neurological: HA, dizzy, numbness, speech difficulty, weakness, memory loss  Psychiatric: agitation, decreased  concentration, depression, nervous/anxious Sleep: restless leg, apnea, frequent waking, daytime sleepiness, snoring  The following represents the patient's updated allergies and side effects list: Allergies  Allergen Reactions  . Doxycycline Hives  and Itching  . Lipitor [Atorvastatin] Other (See Comments)    Myalgia   . Oxycodone Itching    Labs since last visit of relevance include the following: Results for orders placed or performed in visit on 11/09/15  Hepatitis A antibody, total  Result Value Ref Range   Hep A Total Ab REACTIVE (A) NON REACTIVE  Hepatitis B core antibody, total  Result Value Ref Range   Hep B Core Total Ab NON REACTIVE NON REACTIVE  Hepatitis B surface antibody  Result Value Ref Range   Hep B S Ab NEG NEGATIVE  Hepatitis B surface antigen  Result Value Ref Range   Hepatitis B Surface Ag NEGATIVE NEGATIVE  HIV antibody  Result Value Ref Range   HIV 1&2 Ab, 4th Generation NONREACTIVE NONREACTIVE  ANA  Result Value Ref Range   Anit Nuclear Antibody(ANA) POS (A) NEGATIVE  Iron  Result Value Ref Range   Iron 22 (L) 50 - 180 ug/dL  Anti-nuclear ab-titer (ANA titer)  Result Value Ref Range   ANA Pattern 1 HOMOGENEOUS (A)    ANA Titer 1 1:160 (H) titer    The neurologically relevant items on the patient's problem list were reviewed on today's visit.  Neurologic Examination  A problem focused neurological exam (12 or more points of the single system neurologic examination, vital signs counts as 1 point, cranial nerves count for 8 points) was performed.  Blood pressure 147/65, pulse 73, height '5\' 11"'$  (1.803 m), weight 170 lb 6.4 oz (77.293 kg).  General - Well nourished, well developed, in no apparent distress.  Ophthalmologic - Sharp disc margins OU.  Cardiovascular - Regular rate and rhythm with no murmur.  Mental Status -  Level of arousal and orientation to time, place, and person were intact. Language including expression, naming, repetition, comprehension was assessed and found intact. Attention span and concentration were normal. Fund of Knowledge was assessed and was intact.  Cranial Nerves II - XII - II - Visual field intact OU. III, IV, VI - Extraocular movements intact. V -  Facial sensation intact bilaterally. VII - Facial movement intact bilaterally. VIII - Hearing & vestibular intact bilaterally. X - Palate elevates symmetrically. XI - Chin turning & shoulder shrug intact bilaterally. XII - Tongue protrusion intact.  Motor Strength - The patient's strength was normal in all extremities and pronator drift was absent. Left shoulder pain. Bulk was normal and fasciculations were absent.   Motor Tone - Muscle tone was assessed at the neck and appendages and was normal.  Reflexes - The patient's reflexes were normal in all extremities and he had no pathological reflexes.  Sensory - Light touch, temperature/pinprick were assessed and were normal.    Coordination - The patient had normal movements in the hands and feet with no ataxia or dysmetria.  Tremor was absent.  Gait and Station - The patient's transfers, posture, gait, station, and turns were observed as normal.  Data reviewed: I personally reviewed the images and agree with the radiology interpretations.  Dg Chest 2 View  02/28/2014  No edema or consolidation.  Ct Head Wo Contrast  02/28/2014  Remote appearing basal ganglia small lacunar infarcts. No acute intracranial finding by noncontrast CT.  Mr Angiogram Head Wo Contrast  02/28/2014  1. No sizable acute/subacute infarct. Question 2 mm  acute left pontine infarct versus artifact.  2. Remote lacunar infarcts in the right thalamus and left puatmen.  3. Moderately motion degraded MRA. Small and irregular distal right vertebral artery with either severe stenosis or occlusion immediately proximal to the vertebrobasilar junction. Left vertebral artery is dominant and supplies the basilar.  4. Bilateral P2 stenoses, mild on the right and moderate on the left.  5. No evidence of significant ACA or MCA stenosis.  Carotid Doppler Preliminary report: Right: 1-39% ICA stenosis, highest end of range. Left: 60-79% ICA stenosis, highest end of range. Bilateral  vertebral artery flow is antegrade.  2D Echocardiogram pending  EKG - Normal sinus rhythm rate 73 beats per minute.  TTE - 09/24/2014 - Left ventricle: The cavity size was normal. There was severe focal basal and moderate concentric hypertrophy. Systolic function was normal. The estimated ejection fraction was in the range of 55% to 60%. Wall motion was normal; there were no regional wall motion abnormalities. Features are consistent with a pseudonormal left ventricular filling pattern, with concomitant abnormal relaxation and increased filling pressure (grade 2 diastolic dysfunction). - Aortic valve: Moderate thickening and calcification, consistent with sclerosis. - Mitral valve: Calcified annulus. There was trivial regurgitation. - Atrial septum: There was increased thickness of the septum, consistent with lipomatous hypertrophy. Ct Head Wo Contrast 09/23/2014  1. No acute intracranial findings.  2. Chronic small vessel disease with remote right thalamus lacunar infarct.  Ct Angio Neck W/cm &/or Wo/cm 09/24/2014  1. Heavy atheromatous plaque about the carotid bifurcations extending into the proximal ICAs bilaterally. There is associated stenoses of approximately 70-80% by NASCET criteria bilaterally, slightly worse on the left. The area of involvement extends from the carotid bifurcations bilaterally, and extends approximately 6 mm in length on the right, and 12 mm in length on the left.  2. Widely patent vertebral arteries bilaterally. The distal intracranial aspect of the right vertebral artery is markedly diminutive and stenotic as compared to the left. This is similar as seen on prior intracranial MRA.  Mr Brain Wo Contrast 09/23/2014  1. No acute intracranial abnormality. Stable non contrast MRI appearance of the brain since 2015.  2. Stable intracranial MRA; chronically diseased and stenotic distal right vertebral artery, and left greater than  right carotid siphon atherosclerosis.  Mr Jodene Nam Head/brain Wo Cm 09/23/2014  1. No acute intracranial abnormality. Stable non contrast MRI appearance of the brain since 2015.  2. Stable intracranial MRA; chronically diseased and stenotic distal right vertebral artery, and left greater than right carotid siphon atherosclerosis.  CUS 09/23/14 - Preliminary findings: Right = 60-79% ICA stenosis. Left = 80-99% ICA stenosis. Stenosis has worsened bilaterally since last study 02/2014 where previously the right was 1-39% range and the left was 60-79% range.  MRI head 09/27/15 1. Acute ischemic infarct involving the right basal ganglia/ corona radiata without mass effect. Probable faint petechial hemorrhage without evidence for hemorrhagic transformation. 2. Additional punctate cortical infarct within the left occipital pole. Possible faint petechial hemorrhage with this infarct as well. 3. Age-related cerebral atrophy with mild chronic small vessel ischemic disease with additional scattered remote lacunar infarcts as above. MRA NECK 09/27/15 1. Limited study due to motion artifact. 2. Eccentric plaque at the proximal right ICA with secondary moderate to severe short-segment stenosis. Correlation with carotid Doppler ultrasound and/or CTA could be performed for further evaluation. 3. Possible moderate to severe stenosis at the origin of the left common carotid artery, poorly evaluated on this exam. 4. No other critical or  flow-limiting stenosis identified within the major arterial vasculature of the neck. 5. Poor evaluation of the proximal vertebral arteries. Vertebral arteries are patent distally with antegrade flow to the skullbase. CUS 09/28/15 - Findings consistent with 60-79% stenosis involving the right  internal carotid artery. Essentially unchanged from the prior  study dated 09/23/14. - Left internal carotid artery - no evidence of restenosis noted. 2D echo 09/29/15 -  - Left  ventricle: The apex is not adequately visualized to rule out  apical thrombus. The cavity size was normal. There was mild  concentric hypertrophy. Systolic function was mildly reduced. The  estimated ejection fraction was in the range of 45% to 50%.  Images were inadequate for LV wall motion assessment. Features  are consistent with a pseudonormal left ventricular filling  pattern, with concomitant abnormal relaxation and increased  filling pressure (grade 2 diastolic dysfunction). Doppler  parameters are consistent with high ventricular filling pressure. - Aortic valve: Trileaflet; normal thickness, mildly calcified  leaflets. - Mitral valve: Calcified annulus. There was trivial regurgitation. - Left atrium: The atrium was mildly dilated. - Recommendations: Recommend definity contrast study to rule out  apical thrombus. Consider transesophageal echocardiography if  clinically indicated in order to exclude intracardiac thrombus. Impressions: - No cardiac source of emboli was indentified.  Component     Latest Ref Rng 10/10/2013 02/28/2014 04/20/2014  Cholesterol     0 - 200 mg/dL 148    Triglycerides     0.0 - 149.0 mg/dL 431.0 (H)    HDL     >39.00 mg/dL 29.60 (L)    VLDL     0.0 - 40.0 mg/dL 86.2 (H)    LDL (calc)     0 - 99 mg/dL 32    Total CHOL/HDL Ratio      5    Hemoglobin A1C     <5.7 % 9.1 (H) 6.3 (H)   Mean Plasma Glucose     <117 mg/dL  134 (H)   TSH     0.35 - 4.50 uIU/mL 6.15 (H)  5.30 (H)   Component     Latest Ref Rng 09/23/2014 09/24/2014 02/16/2015 06/09/2015  Cholesterol     0 - 200 mg/dL  91  168  Triglycerides     <150 mg/dL  123  228.0 (H)  HDL Cholesterol     >40 mg/dL  28 (L)  30.60 (L)  Total CHOL/HDL Ratio       3.3  5  VLDL     0 - 40 mg/dL  25  45.6 (H)  LDL (calc)     0 - 99 mg/dL  38    NonHDL         136.93  Hemoglobin A1C     4.8 - 5.6 %  6.9 (H) 6.1 7.7 (H)  Mean Plasma Glucose       151    Vitamin B12     211 - 911 pg/mL  394     Folate      11.8     TSH     0.350 - 4.500 uIU/mL   7.21 (H)   Anit Nuclear Antibody(ANA)     NEGATIVE       Component     Latest Ref Rng 09/27/2015 09/29/2015 11/09/2015  Cholesterol     0 - 200 mg/dL  182   Triglycerides     <150 mg/dL  359 (H)   HDL Cholesterol     >40 mg/dL  24 (L)  Total CHOL/HDL Ratio       7.6   VLDL     0 - 40 mg/dL  72 (H)   LDL (calc)     0 - 99 mg/dL  86   NonHDL          Hemoglobin A1C     4.8 - 5.6 % 7.5 (H)    Mean Plasma Glucose      169    Vitamin B12     211 - 911 pg/mL     Folate          TSH     0.350 - 4.500 uIU/mL 6.651 (H)    Anit Nuclear Antibody(ANA)     NEGATIVE   POS (A)    Assessment: As you may recall, he is a 60 y.o. Caucasian male with PMH of HTN, DM, PVD and smoker was admitted 02/2014 for vertigo, right facial and hand numbness, and slurred speech. MRI showed questionable 2 mm acute left pontine infarct versus artifact. MRA showed right VA severe stenosis and possible occlusion. Strokes felt secondary to small vessel disease. Risk factor including HTN, DM, HLD as well as smoker. Although CUS showed right 1-39% and the left 60-79% ICA stenosis, still considered both asymptomatic. No VVS consult made. He was discharged on ASA and continued on cilostazol, as well as lipitor. He was encouraged to quit smoking. Admitted on 09/23/14 for TIA with slurry speech, left hemiparesis and left facial droop. MRI no acute infarct, MRA unremarkable. However, CUS showed worsening ICA stenosis with right 60-79% and left 80-99% stenosis. CTA neck done showed bilateral 70-80% ICA stenosis with left worse than right. LDL 63 and A1C 6.9. He has not quit smoking. Had LEFT CEA with bovine pericardial patch angioplasty by Dr. Trula Slade without complications in 10/1515. However, re-admitted on 09/27/15 due to right basal ganglia infarct, right CR infarct as well as left occipital pole infarct. MRA neck showed right ICA high grade stenosis and CUS showed  right ICA 60-79% stenosis and left patent. LDL 86 and A1C 7.5. His stroke likely symptomatic from proximal right carotid stenosis. He was put on aspirin 325 mg daily and clopidogrel 75 mg daily. VVS consulted and plan for right CEA. He stopped statin and plavix due to intolerance.  Plan:  - continue ASA and re-start plavix for cardiac and stroke prevention - make appointment with Dr. Trula Slade ASAP to have right CEA - check BP at home and BP goal 130-150 due to right ICA high grade stenosis - check glucose at home - quit smoking - consider 30 day cardiac monitoring next visit - consider to repeat ANA, TSH, FT4 next visit - Follow up with your primary care physician for stroke risk factor modification. Recommend maintain blood pressure goal <130-150/80, diabetes with hemoglobin A1c goal below 6.5% and lipids with LDL cholesterol goal below 70 mg/dL.  - follow up in 2 months.  A total of 45 minutes was spent face-to-face with this patient. Over half this time was spent on counseling patient on the recurrent stroke, reviewing all the previous admissions and all the images with pt and his wife, discussing importance of right CEA, as well as smoking cessation.     No orders of the defined types were placed in this encounter.    Meds ordered this encounter  Medications  . clopidogrel (PLAVIX) 75 MG tablet    Sig: Take 1 tablet (75 mg total) by mouth daily.    Dispense:  30 tablet  Refill:  11    Patient Instructions  - continue ASA and cilostazol for stroke and PVD prevention - continue lipitor of stroke prevention - quit smoking is the key for stroke prevention - ask prescription for a home BP monitoring device to monitor BP at home - continue to monitor glucose at home - follow up with PCP for stroke risk factor modification - today your BP was high, I will increase your lisinopril to '20mg'$  a day. Please check your BP at home twice a day initially to get the trend and adjust medication  as per your PCP. - follow up as needed.    Rosalin Hawking, MD PhD [facility] [address], [place], [place] [address] [phone]

## 2015-11-13 LAB — HCV RNA,LIPA RFLX NS5A DRUG RESIST

## 2015-11-15 LAB — HCV RNA NS5A DRUG RESISTANCE

## 2015-11-16 ENCOUNTER — Other Ambulatory Visit: Payer: Self-pay | Admitting: *Deleted

## 2015-11-16 ENCOUNTER — Ambulatory Visit (HOSPITAL_COMMUNITY)
Admission: RE | Admit: 2015-11-16 | Discharge: 2015-11-16 | Disposition: A | Discharge status: Home / Self Care | Payer: Medicare Other | Source: Ambulatory Visit | Attending: Surgery | Admitting: Surgery

## 2015-11-16 ENCOUNTER — Other Ambulatory Visit: Payer: Self-pay | Admitting: Surgery

## 2015-11-16 DIAGNOSIS — I6523 Occlusion and stenosis of bilateral carotid arteries: Secondary | ICD-10-CM | POA: Diagnosis not present

## 2015-11-16 DIAGNOSIS — Z48812 Encounter for surgical aftercare following surgery on the circulatory system: Secondary | ICD-10-CM

## 2015-11-16 DIAGNOSIS — K219 Gastro-esophageal reflux disease without esophagitis: Secondary | ICD-10-CM | POA: Insufficient documentation

## 2015-11-16 DIAGNOSIS — E1142 Type 2 diabetes mellitus with diabetic polyneuropathy: Secondary | ICD-10-CM | POA: Insufficient documentation

## 2015-11-16 DIAGNOSIS — I10 Essential (primary) hypertension: Secondary | ICD-10-CM | POA: Insufficient documentation

## 2015-11-16 DIAGNOSIS — E785 Hyperlipidemia, unspecified: Secondary | ICD-10-CM | POA: Diagnosis not present

## 2015-11-16 NOTE — Patient Outreach (Signed)
[facility]) Care Management   11/16/2015  Brad [name] 1956/02/22 [id number]  Brad [name] is an 60 y.o. male  Subjective:   Member reports that he is "doing."  Wife reports that he has been having trouble with swelling in his legs over the past week.  He admits that when he is sitting he does not elevate them, but will try to do so going forward.  He reports taking his Furosemide as prescribed, but that it has not helped the swelling.  He denies contacting his cardiologist to notify them of the swelling.  He also complains of numbness and tingling in the left arm.  The left side of his mouth is slightly droopy, and he states that the left side of his face, around his forehead, is numb.  His wife states this started yesterday.  He has refused to go the emergency department for evaluation fearing he will be admitted.  Objective:   Review of Systems  Constitutional: Negative.   Eyes: Negative.   Respiratory: Positive for shortness of breath.        With activity, continues to smoke   Cardiovascular: Positive for leg swelling.       2-3+ pitting edema  Gastrointestinal: Negative.   Genitourinary: Negative.   Musculoskeletal: Positive for joint pain and neck pain.  Skin: Negative.   Neurological: Positive for tingling.  Endo/Heme/Allergies: Negative.   Psychiatric/Behavioral: Negative.     Physical Exam  Constitutional: He is oriented to person, place, and time. He appears well-developed and well-nourished.  Neck: Normal range of motion.  Cardiovascular: Normal rate, regular rhythm and normal heart sounds.   Respiratory: Effort normal and breath sounds normal.  GI: Soft. Bowel sounds are normal.  Musculoskeletal: Normal range of motion.  Neurological: He is alert and oriented to person, place, and time.  Skin: Skin is warm and dry.    BP 172/80 mmHg  Pulse 61  Resp 22  Wt 264 lb (119.75 kg)  SpO2 99%   Encounter Medications:   Outpatient  Encounter Prescriptions as of 11/16/2015  Medication Sig Note  . amLODipine (NORVASC) 5 MG tablet Take 1 tablet (5 mg total) by mouth daily.   Marland Kitchen aspirin EC 325 MG tablet Take 1 tablet (325 mg total) by mouth daily.   . Blood Pressure Monitor DEVI Use to check Blood pressure daily 09/07/2015: Patient reported 09/07/2015 he does not have a BP cuff.   . clopidogrel (PLAVIX) 75 MG tablet Take 1 tablet (75 mg total) by mouth daily.   . ferrous sulfate 325 (65 FE) MG tablet Take 1 tablet (325 mg total) by mouth 3 (three) times daily with meals.   . furosemide (LASIX) 40 MG tablet TAKE 1 TABLET BY MOUTH IN THE MORNING,WITH A SECOND DOSE IN THE EVENING AS NEEDED ONLY FOR SWELLING   . glucose blood (ACCU-CHEK AVIVA PLUS) test strip Use to check blood sugars daily Dx E11.9   . ipratropium (ATROVENT HFA) 17 MCG/ACT inhaler Inhale 2 puffs into the lungs 4 (four) times daily. 10/19/2015: Will start after done taking spiriva   . Lancets MISC 1 each by Other route See admin instructions. Reported on 09/07/2015   . levothyroxine (SYNTHROID, LEVOTHROID) 112 MCG tablet Take 1 tablet (112 mcg total) by mouth daily.   Marland Kitchen lisinopril (PRINIVIL,ZESTRIL) 20 MG tablet Take 1 tablet (20 mg total) by mouth daily.   . metFORMIN (GLUCOPHAGE-XR) 500 MG 24 hr tablet 3 tabs by mouth in the AM (Patient taking differently:  Take 1,500 mg by mouth daily with breakfast. Only taking 2 tablets)   . metoprolol succinate (TOPROL-XL) 50 MG 24 hr tablet Take 1 tablet (50 mg total) by mouth daily. Take with or immediately following a meal.   . PROAIR HFA 108 (90 BASE) MCG/ACT inhaler INHALE 2 PUFFS BY MOUTH EVERY 6 HOURS AS NEEDED FOR WHEEZING OR SHORTNESS OF BREATH   . tiZANidine (ZANAFLEX) 4 MG tablet TAKE 1 TABLET BY MOUTH EVERY 6 HOURS AS NEEDED FOR MUSCLE SPASMS   . traMADol (ULTRAM) 50 MG tablet TAKE 1-2 TABLETS BY MOUTH UP TO FOUR TIMES DAILY   . Respiratory Therapy Supplies (NEBULIZER) DEVI Use as directed four times daily (Patient not  taking: Reported on 11/16/2015)    No facility-administered encounter medications on file as of 11/16/2015.    Functional Status:   In your present state of health, do you have any difficulty performing the following activities: 09/27/2015 09/21/2015  Hearing? Tempie Donning  Vision? N Y  Difficulty concentrating or making decisions? N N  Walking or climbing stairs? Y Y  Dressing or bathing? N N  Doing errands, shopping? N N  Preparing Food and eating ? - N  Using the Toilet? - N  In the past six months, have you accidently leaked urine? - N  Do you have problems with loss of bowel control? - N  Managing your Medications? - N  Managing your Finances? - N  Housekeeping or managing your Housekeeping? - N    Fall/Depression Screening:    PHQ 2/9 Scores 09/21/2015 06/14/2015  PHQ - 2 Score 0 0    Assessment:    Arrived at [facility] home prior to scheduled time (call placed to member prior to visit, permission granted to arrive early).  Wife present and involved with conversation, providing details.  She state that she called his PCP office today to schedule an appointment, but was told that there were no openings.  She report that she has been trying to get the member to go to the emergency department since yesterday but he refuses.  Discussed with member the importance of seeking medical attention as he is showing symptoms of another stroke, advised to go to the ED, he refuses.  Advised to call cardiology office to report the swelling and numbness, appointment obtained for tomorrow.  Member states that he will not go because "they're just gonna put me in the hospital."  Advised again on the urgency of medical attention, he agrees to the appointment, but continues to refuse to go to the ED.  He is not having any trouble speaking or walking, states that he did have some dizziness yesterday which has subsided.  He states that he know his symptoms are related to the blocked arteries in the right side of his  neck, and he has just been able to reschedule the appointment with the vascular physician for next week (he missed his last appointment).  Advised on importance of keeping this appointment.    Member reports taking all medications with the exception of Norvasc.  Advised on the importance of taking all medications as prescribed, he verbalizes understanding and takes it immediately.  He states "I'm just tired of taking all these pills."  Discussed the complications of his disease if his medications are not taken.  Also discussed the effect that his continued smoking has on his health, including heart disease, risk for stroke, and COPD.  He states that he has cut down from two packs/day to one.  Discussed smoking cessation methods, including the patch which he has tried in the past.  He reports that he will continue to decrease the amount he smokes on a daily basis.  Member/wife deny any further questions or concerns at this time.  She states "I wish I would've called you yesterday, maybe you could've talked him into going yesterday."  Advised to contact this care manager at any time with any concerns/questions, especially with concern for stroke, heart attack, etc. Confirmed that contact information is in [facility] calendar book.  Plan:   Will follow up with member next week regarding outcome of cardiology visit.  Will schedule home visit for next month at that time.  THN CM Care Plan Problem One        Most Recent Value   Care Plan Problem One  Elevated blood pressure   Role Documenting the Problem One  Care Management Coordinator   Care Plan for Problem One  Active   THN Long Term Goal (31-90 days)  Member's blood pressure will consistently be within target range within the next 45 days   THN Long Term Goal Start Date  11/16/15   Interventions for Problem One Long Term Goal  Discussed complications of elevated blood pressure, including stroke and heart attack.   THN CM Short Term Goal #1 (0-30 days)   Member will report taking all medications as prescribed over the next 4 weeks   THN CM Short Term Goal #1 Start Date  11/16/15   Interventions for Short Term Goal #1  Discussed importance of taking medications as directed in effort to control blood pressure and decrease risk of additional strokes.  Discussed importance of taking all medications, including Norvasc   THN CM Short Term Goal #2 (0-30 days)  Member will attend cardiology appointment this week   THN CM Short Term Goal #2 Start Date  11/16/15   Interventions for Short Term Goal #2  Appointment confirmed for tomorrow, transportation confirmed.  Discussed the importance of following up with physician to assess current symptoms (numbness on left side, swelling in legs)     [name], BSN, [place] Manager [phone]

## 2015-11-17 ENCOUNTER — Encounter: Payer: Self-pay | Admitting: Surgery

## 2015-11-17 ENCOUNTER — Ambulatory Visit: Payer: Medicare Other | Admitting: Physician Assistant

## 2015-11-18 ENCOUNTER — Other Ambulatory Visit: Payer: Self-pay | Admitting: *Deleted

## 2015-11-18 NOTE — Patient Outreach (Signed)
Call placed to member to follow up on concerns of numbness on left side and leg swelling.  He was to see cardiologist yesterday, but wife state he didn't want to keep the appointment.  She states he will keep the appointment with the vascular physician for Monday.  Member reports decreased swelling after elevating feet several hours a day, and reports decreased numbness in left arm/face.  Denies urgent concerns and states he will for sure attend Monday's appointment with vascular.  Will follow up in 2 weeks.  [name], BSN, [place] Management  [facility] Care Manager [phone]

## 2015-11-22 ENCOUNTER — Ambulatory Visit (INDEPENDENT_AMBULATORY_CARE_PROVIDER_SITE_OTHER): Payer: Medicare Other | Admitting: Surgery

## 2015-11-22 ENCOUNTER — Other Ambulatory Visit: Payer: Self-pay

## 2015-11-22 VITALS — BP 141/65 | HR 64 | Temp 97.1°F | Resp 20 | Ht 71.0 in | Wt 268.0 lb

## 2015-11-22 DIAGNOSIS — I63239 Cerebral infarction due to unspecified occlusion or stenosis of unspecified carotid arteries: Secondary | ICD-10-CM

## 2015-11-22 DIAGNOSIS — I639 Cerebral infarction, unspecified: Secondary | ICD-10-CM | POA: Diagnosis not present

## 2015-11-22 NOTE — Progress Notes (Signed)
Vascular and Vein Specialist of [place]  Patient name: Brad [name] MRN: [id number] DOB: 08-24-1955 Sex: male  REASON FOR VISIT: Left arm numbness  HPI: Brad [name] is a 60 y.o. male who is status post left carotid endarterectomy on 11/13/2014.  He was having right-sided facial weakness and blurry vision.  He was recently in the hospital February 2017 with left leg and arm numbness and weakness as well as dizziness and amaurosis.  He had a carotid duplex that showed 60-79 percent right carotid stenosis stenosis.  He was scheduled for right carotid endarterectomy but left the hospital AMA.  He did go to see ENT for evaluation of his hoarseness.  He does have a left vocal cord paralysis which is mild.  The patient is back today stating that for the past 4 days he has been having numbness in his left arm from the elbow all the way down as well as his left leg and left face.  The patient continues to smoke, however he states that he is going to stop. He is medically managed for diabetes which has been moderately controlled he is on a statin for hypercholesterolemia and a ACE inhibitor for hypertension.  Past Medical History  Diagnosis Date  . Depression   . GERD (gastroesophageal reflux disease)   . Hyperlipidemia 08/27/2014    takes Simvastatin daily  . Hypertension     takes Amlodipine and Lisinopril daily  . Hypothyroidism 08/27/2014    takes Synthroid daily  . Anemia     takes Ferrous Sulfated daily  . Carotid stenosis 08/27/2014    takes Aspirin daily  . Diabetes mellitus type II     takes Metformin and Glipizide daily  . Muscle spasm     takes Zanaflex daily as needed  . Insomnia     takes Pamelor nightly  . COPD (chronic obstructive pulmonary disease) (HCC)     Albuterol daily as needed and Spiriva daily  . MVP (mitral valve prolapse)   . History of blood clots     in right leg;being followed by Dr.Brabham for this  . Shortness of breath dyspnea   . Peripheral  neuropathy ([place])   . Headache     occasionally  . Dizziness   . Stroke ([place])   . Arthritis   . Joint pain     hips  . Peripheral edema     takes Furosemide daily as needed  . History of hiatal hernia   . History of gastric ulcer   . Urinary frequency   . Urinary urgency   . History of blood transfusion 09/2014    2 units-no abnormal reaction noted  . HTN (hypertension) 09/14/2015  . Chronic hepatitis C ([place]) 09/17/2015  . Prurigo nodularis     Family History  Problem Relation Age of Onset  . Alcohol abuse Father   . Lung cancer Father   . Heart disease Mother     automated implantable cardioverter-defibrillator   . Diabetes type II Mother   . Lung cancer Maternal Grandmother   . Lung cancer Sister     SOCIAL HISTORY: Social History  Substance Use Topics  . Smoking status: Current Every Day Smoker -- 1.00 packs/day for 48 years    Types: Cigarettes  . Smokeless tobacco: Never Used     Comment: 1-2 pks per day  . Alcohol Use: 0.6 oz/week    1 Cans of beer per week     Comment: beer monthly    Allergies  Allergen Reactions  . Doxycycline Hives and Itching  . Lipitor [Atorvastatin] Other (See Comments)    Myalgia   . Oxycodone Itching    Current Outpatient Prescriptions  Medication Sig Dispense Refill  . amLODipine (NORVASC) 5 MG tablet Take 1 tablet (5 mg total) by mouth daily. 90 tablet 3  . aspirin EC 325 MG tablet Take 1 tablet (325 mg total) by mouth daily. 30 tablet 0  . Blood Pressure Monitor DEVI Use to check Blood pressure daily 1 Device 0  . clopidogrel (PLAVIX) 75 MG tablet Take 1 tablet (75 mg total) by mouth daily. 30 tablet 11  . ferrous sulfate 325 (65 FE) MG tablet Take 1 tablet (325 mg total) by mouth 3 (three) times daily with meals. 30 tablet 3  . furosemide (LASIX) 40 MG tablet TAKE 1 TABLET BY MOUTH IN THE MORNING,WITH A SECOND DOSE IN THE EVENING AS NEEDED ONLY FOR SWELLING 60 tablet 2  . glucose blood (ACCU-CHEK AVIVA PLUS) test strip Use to  check blood sugars daily Dx E11.9 100 each 3  . ipratropium (ATROVENT HFA) 17 MCG/ACT inhaler Inhale 2 puffs into the lungs 4 (four) times daily. 3 Inhaler 3  . Lancets MISC 1 each by Other route See admin instructions. Reported on 09/07/2015    . levothyroxine (SYNTHROID, LEVOTHROID) 112 MCG tablet Take 1 tablet (112 mcg total) by mouth daily. 90 tablet 3  . lisinopril (PRINIVIL,ZESTRIL) 20 MG tablet Take 1 tablet (20 mg total) by mouth daily. 90 tablet 0  . metFORMIN (GLUCOPHAGE-XR) 500 MG 24 hr tablet 3 tabs by mouth in the AM (Patient taking differently: Take 1,500 mg by mouth daily with breakfast. Only taking 2 tablets) 270 tablet 3  . metoprolol succinate (TOPROL-XL) 50 MG 24 hr tablet Take 1 tablet (50 mg total) by mouth daily. Take with or immediately following a meal. 90 tablet 3  . PROAIR HFA 108 (90 BASE) MCG/ACT inhaler INHALE 2 PUFFS BY MOUTH EVERY 6 HOURS AS NEEDED FOR WHEEZING OR SHORTNESS OF BREATH 8.5 g 11  . Respiratory Therapy Supplies (NEBULIZER) DEVI Use as directed four times daily 1 each 0  . tiZANidine (ZANAFLEX) 4 MG tablet TAKE 1 TABLET BY MOUTH EVERY 6 HOURS AS NEEDED FOR MUSCLE SPASMS 60 tablet 0  . traMADol (ULTRAM) 50 MG tablet TAKE 1-2 TABLETS BY MOUTH UP TO FOUR TIMES DAILY 120 tablet 5   No current facility-administered medications for this visit.    REVIEW OF SYSTEMS:  '[X]'$  denotes positive finding, '[ ]'$  denotes negative finding Cardiac  Comments:  Chest pain or chest pressure:    Shortness of breath upon exertion: x   Short of breath when lying flat: x   Irregular heart rhythm:        Vascular    Pain in calf, thigh, or hip brought on by ambulation: x   Pain in feet at night that wakes you up from your sleep:     Blood clot in your veins:    Leg swelling:         Pulmonary    Oxygen at home:    Productive cough:  x   Wheezing:  x       Neurologic    Sudden weakness in arms or legs:     Sudden numbness in arms or legs:     Sudden onset of  difficulty speaking or slurred speech:    Temporary loss of vision in one eye:     Problems with dizziness:  Gastrointestinal    Blood in stool:     Vomited blood:         Genitourinary    Burning when urinating:     Blood in urine:        Psychiatric    Major depression:         Hematologic    Bleeding problems:    Problems with blood clotting too easily:        Skin    Rashes or ulcers:        Constitutional    Fever or chills: x     PHYSICAL EXAM: Filed Vitals:   11/22/15 0933 11/22/15 0937  BP: 144/62 141/65  Pulse: 64 64  Temp: 97.1 F (36.2 C)   TempSrc: Oral   Resp: 20   Height: '5\' 11"'$  (1.803 m)   Weight: 268 lb (121.564 kg)   SpO2: 99%     GENERAL: The patient is a well-nourished male, in no acute distress. The vital signs are documented above. CARDIAC: There is a regular rate and rhythm.  PULMONARY: There is good air exchange bilaterally without wheezing or rales. ABDOMEN: Soft and non-tender with normal pitched bowel sounds.  MUSCULOSKELETAL: There are no major deformities or cyanosis. NEUROLOGIC: Numbness left arm left leg. SKIN: There are no ulcers or rashes noted. PSYCHIATRIC: The patient has a normal affect.  DATA:  I have again reviewed the patient's carotid duplex which shows 60-79% stenosis on the right  MEDICAL ISSUES: I discussed with the patient and his wife, that he needs to have the right carotid stenosis addressed.  Because of the vocal cord paralysis on the left, I think he has to undergo right carotid stenting.  We discussed the risks and benefits of the procedure including the risks of access site complications, stroke, and cardio-pulmonary complications.  All his questions were answered.  He will continue to take his aspirin and Plavix daily.  I have put him on the schedule for Tuesday, April 25.  He will call me or go to the emergency department if he has any more symptoms    Brabham, [place] [facility] and Vein Specialists of  [place]: [phone]

## 2015-11-24 ENCOUNTER — Telehealth: Payer: Self-pay | Admitting: *Deleted

## 2015-11-24 ENCOUNTER — Encounter: Payer: Self-pay | Admitting: Internal Medicine

## 2015-11-24 ENCOUNTER — Ambulatory Visit (INDEPENDENT_AMBULATORY_CARE_PROVIDER_SITE_OTHER): Payer: Medicare Other | Admitting: Internal Medicine

## 2015-11-24 VITALS — BP 175/67 | HR 71 | Temp 97.6°F | Ht 71.0 in | Wt 276.0 lb

## 2015-11-24 DIAGNOSIS — Z23 Encounter for immunization: Secondary | ICD-10-CM

## 2015-11-24 DIAGNOSIS — I639 Cerebral infarction, unspecified: Secondary | ICD-10-CM | POA: Diagnosis not present

## 2015-11-24 DIAGNOSIS — B182 Chronic viral hepatitis C: Secondary | ICD-10-CM | POA: Diagnosis not present

## 2015-11-24 MED ORDER — LEDIPASVIR-SOFOSBUVIR 90-400 MG PO TABS: 1.0000 | ORAL_TABLET | Freq: Every day | ORAL | Status: DC

## 2015-11-24 NOTE — Addendum Note (Signed)
Addended by: Myrtis Hopping A on: 11/24/2015 02:08 PM   Modules accepted: Orders

## 2015-11-24 NOTE — Progress Notes (Signed)
[facility]   CC: consideration for treatment for chronic hepatitis C  HPI:  +Brad [name] is a 60 y.o. male who presents for initial evaluation and management of chronic hepatitis C.  Patient tested positive this year during routine screening. Hepatitis C-associated risk factors present are: IV drug abuse (details: last in the 1970s). Patient denies history of blood transfusion, intranasal drug use, renal dialysis, sexual contact with person with liver disease, tattoos. Patient has had other studies performed. Results: hepatitis C RNA by PCR, result: positive. Patient has not had prior treatment for Hepatitis C. Patient does not have a past history of liver disease. Patient does not have a family history of liver disease. Patient does not  have associated signs or symptoms related to liver disease.  Labs reviewed and confirm chronic hepatitis C with a positive viral load.   Records reviewed from PCP, getting carotid intervention next week.   Continues to smoke.        Patient does have documented immunity to Hepatitis A. Patient does not have documented immunity to Hepatitis B.    Review of Systems:   Constitutional: negative for anorexia Gastrointestinal: negative for nausea and diarrhea Musculoskeletal: negative for myalgias and arthralgias All other systems reviewed and are negative      Past Medical History  Diagnosis Date  . Depression   . GERD (gastroesophageal reflux disease)   . Hyperlipidemia 08/27/2014    takes Simvastatin daily  . Hypertension     takes Amlodipine and Lisinopril daily  . Hypothyroidism 08/27/2014    takes Synthroid daily  . Anemia     takes Ferrous Sulfated daily  . Carotid stenosis 08/27/2014    takes Aspirin daily  . Diabetes mellitus type II     takes Metformin and Glipizide daily  . Muscle spasm     takes Zanaflex daily as needed  . Insomnia     takes Pamelor nightly  . COPD (chronic obstructive pulmonary  disease) (HCC)     Albuterol daily as needed and Spiriva daily  . MVP (mitral valve prolapse)   . History of blood clots     in right leg;being followed by Dr.Brabham for this  . Shortness of breath dyspnea   . Peripheral neuropathy ([place])   . Headache     occasionally  . Dizziness   . Stroke ([place])   . Arthritis   . Joint pain     hips  . Peripheral edema     takes Furosemide daily as needed  . History of hiatal hernia   . History of gastric ulcer   . Urinary frequency   . Urinary urgency   . History of blood transfusion 09/2014    2 units-no abnormal reaction noted  . HTN (hypertension) 09/14/2015  . Chronic hepatitis C ([place]) 09/17/2015  . Prurigo nodularis     Prior to Admission medications   Medication Sig Start Date End Date Taking? Authorizing Provider  amLODipine (NORVASC) 5 MG tablet Take 1 tablet (5 mg total) by mouth daily. 10/14/15 10/13/16 Yes Biagio Borg, MD  aspirin EC 325 MG tablet Take 1 tablet (325 mg total) by mouth daily. 09/25/14  Yes Belkys A Regalado, MD  Blood Pressure Monitor DEVI Use to check Blood pressure daily 05/25/14  Yes Biagio Borg, MD  clopidogrel (PLAVIX) 75 MG tablet Take 1 tablet (75 mg total) by mouth daily. 11/11/15  Yes Rosalin Hawking, MD  ferrous sulfate 325 (65 FE) MG  tablet Take 1 tablet (325 mg total) by mouth 3 (three) times daily with meals. 09/25/14  Yes Belkys A Regalado, MD  furosemide (LASIX) 40 MG tablet TAKE 1 TABLET BY MOUTH IN THE MORNING,WITH A SECOND DOSE IN THE EVENING AS NEEDED ONLY FOR SWELLING 11/09/15  Yes Biagio Borg, MD  glucose blood (ACCU-CHEK AVIVA PLUS) test strip Use to check blood sugars daily Dx E11.9 11/09/15  Yes Biagio Borg, MD  ipratropium (ATROVENT HFA) 17 MCG/ACT inhaler Inhale 2 puffs into the lungs 4 (four) times daily. 10/14/15  Yes Biagio Borg, MD  Lancets MISC 1 each by Other route See admin instructions. Reported on 09/07/2015   Yes Historical Provider, MD  levothyroxine (SYNTHROID, LEVOTHROID) 112 MCG tablet Take 1  tablet (112 mcg total) by mouth daily. 06/09/15  Yes Biagio Borg, MD  lisinopril (PRINIVIL,ZESTRIL) 20 MG tablet Take 1 tablet (20 mg total) by mouth daily. 09/21/15  Yes Biagio Borg, MD  metFORMIN (GLUCOPHAGE-XR) 500 MG 24 hr tablet 3 tabs by mouth in the AM Patient taking differently: Take 1,500 mg by mouth daily with breakfast. Only taking 2 tablets 06/09/15  Yes Biagio Borg, MD  metoprolol succinate (TOPROL-XL) 50 MG 24 hr tablet Take 1 tablet (50 mg total) by mouth daily. Take with or immediately following a meal. 10/21/15  Yes Jerline Pain, MD  PROAIR HFA 108 (90 BASE) MCG/ACT inhaler INHALE 2 PUFFS BY MOUTH EVERY 6 HOURS AS NEEDED FOR WHEEZING OR SHORTNESS OF BREATH 11/06/14  Yes Biagio Borg, MD  Respiratory Therapy Supplies (NEBULIZER) DEVI Use as directed four times daily 09/14/15  Yes Biagio Borg, MD  tiZANidine (ZANAFLEX) 4 MG tablet TAKE 1 TABLET BY MOUTH EVERY 6 HOURS AS NEEDED FOR MUSCLE SPASMS 11/09/15  Yes Biagio Borg, MD  traMADol (ULTRAM) 50 MG tablet TAKE 1-2 TABLETS BY MOUTH UP TO FOUR TIMES DAILY 08/03/15  Yes Biagio Borg, MD  Ledipasvir-Sofosbuvir (HARVONI) 90-400 MG TABS Take 1 tablet by mouth daily. 11/24/15   Thayer Headings, MD    Allergies  Allergen Reactions  . Doxycycline Hives and Itching  . Lipitor [Atorvastatin] Other (See Comments)    Myalgia   . Oxycodone Itching    Social History  Substance Use Topics  . Smoking status: Current Every Day Smoker -- 1.00 packs/day for 48 years    Types: Cigarettes  . Smokeless tobacco: Never Used     Comment: 1-2 pks per day  . Alcohol Use: 0.6 oz/week    1 Cans of beer per week     Comment: rarely    Family History  Problem Relation Age of Onset  . Alcohol abuse Father   . Lung cancer Father   . Heart disease Mother     automated implantable cardioverter-defibrillator   . Diabetes type II Mother   . Lung cancer Maternal Grandmother   . Lung cancer Sister    Brother with cirrhosis and hepatitis C   Objective:    Constitutional: in no apparent distress and alert,  Filed Vitals:   11/24/15 0859  BP: 175/67  Pulse: 71  Temp: 97.6 F (36.4 C)   Eyes: anicteric Cardiovascular: Cor RRR and No murmurs Respiratory: CTA B; normal respiratory effort Gastrointestinal: Bowel sounds are normal, liver is not enlarged, spleen is not enlarged Musculoskeletal: no pedal edema noted Skin: negative for - jaundice, spider hemangioma, telangiectasia, palmar erythema, ecchymosis and atrophy; no porphyria cutanea tarda Lymphatic: no cervical lymphadenopathy  Laboratory Genotype: No results found for: HCVGENOTYPE HCV viral load:  Lab Results  Component Value Date   HCVQUANT [id number]* 09/14/2015   Lab Results  Component Value Date   WBC 5.7 09/28/2015   HGB 13.9 09/28/2015   HCT 41.6 09/28/2015   MCV 80.3 09/28/2015   PLT 223 09/28/2015    Lab Results  Component Value Date   CREATININE 1.18 09/29/2015   BUN 19 09/29/2015   NA 140 09/29/2015   K 4.4 09/29/2015   CL 104 09/29/2015   CO2 30 09/29/2015    Lab Results  Component Value Date   ALT 30 09/27/2015   AST 43* 09/27/2015   ALKPHOS 131* 09/27/2015     Labs and history reviewed and show CHILD-PUGH A  5-6 points: Child class A 7-9 points: Child class B 10-15 points: Child class C  Lab Results  Component Value Date   INR 1.11 09/27/2015   BILITOT 0.3 09/27/2015   ALBUMIN 2.1* 09/27/2015     Assessment: New Patient with Chronic Hepatitis C genotype 1a, untreated.  I discussed with the patient the lab findings that confirm chronic hepatitis C as well as the natural history and progression of disease including about 30% of people who develop cirrhosis of the liver if left untreated and once cirrhosis is established there is a 2-7% risk per year of liver cancer and liver failure.  I discussed the importance of treatment and benefits in reducing the risk, even if significant liver fibrosis exists.   Plan: 1) Patient counseled extensively  on limiting acetaminophen to no more than 2 grams daily, avoidance of alcohol. 2) Transmission discussed with patient including sexual transmission, sharing razors and toothbrush.   3) Will need referral to gastroenterology if concern for cirrhosis 4) Will need referral for substance abuse counseling: No.; Further work up to include urine drug screen  No. 5) Will prescribe Harvoni for 12 weeks 6) Hepatitis A vaccine No. 7) Hepatitis B vaccine Yes.   8) Pneumovax vaccine if concern for cirrhosis 9) Further work up to include liver staging with elastography 10) will follow up after starting medication

## 2015-11-24 NOTE — Patient Instructions (Signed)
Date 11/24/2015  Dear Mr Glendening, As discussed in the [facility], your hepatitis C therapy will include the following medications:          Harvoni '90mg'$ /'400mg'$  tablet:           Take 1 tablet by mouth once daily   Please note that ALL MEDICATIONS WILL START ON THE SAME DATE for a total of 12 weeks. ---------------------------------------------------------------- Your HCV Treatment Start Date: TBA   Your HCV genotype:  1a    Liver Fibrosis: TBD    ---------------------------------------------------------------- YOUR PHARMACY CONTACT:   [place] [facility] [place] Phone: [phone] Hours: Monday to Friday 7:30 am to 6:00 pm   Please always contact your pharmacy at least 3-4 business days before you run out of medications to ensure your next month's medication is ready or 1 week prior to running out if you receive it by mail.  Remember, each prescription is for 28 days. ---------------------------------------------------------------- GENERAL NOTES REGARDING YOUR HEPATITIS C MEDICATION:  SOFOSBUVIR/LEDIPASVIR (HARVONI): - Harvoni tablet is taken daily with OR without food. - The tablets are orange. - The tablets should be stored at room temperature.  - Acid reducing agents such as H2 blockers (ie. Pepcid (famotidine), Zantac (ranitidine), Tagamet (cimetidine), Axid (nizatidine) and proton pump inhibitors (ie. Prilosec (omeprazole), Protonix (pantoprazole), Nexium (esomeprazole), or Aciphex (rabeprazole)) can decrease effectiveness of Harvoni. Do not take until you have discussed with a health care provider.    -Antacids that contain magnesium and/or aluminum hydroxide (ie. Milk of Magensia, Rolaids, Gaviscon, Maalox, Mylanta, an dArthritis Pain Formula)can reduce absorption of Harvoni, so take them at least 4 hours before or after Harvoni.  -Calcium carbonate (calcium supplements or antacids such as Tums, Caltrate,  Os-Cal)needs to be taken at least 4 hours hours before or after Harvoni.  -St. John's wort or any products that contain St. John's wort like some herbal supplements  Please inform the office prior to starting any of these medications.  - The common side effects associated with Harvoni include:      1. Fatigue      2. Headache      3. Nausea      4. Diarrhea      5. Insomnia  Please note that this only lists the most common side effects and is NOT a comprehensive list of the potential side effects of these medications. For more information, please review the drug information sheets that come with your medication package from the pharmacy.  ---------------------------------------------------------------- GENERAL HELPFUL HINTS ON HCV THERAPY: 1. Stay well-hydrated. 2. Notify the [facility] of any changes in your other over-the-counter/herbal or prescription medications. 3. If you miss a dose of your medication, take the missed dose as soon as you remember. Return to your regular time/dose schedule the next day.  4.  Do not stop taking your medications without first talking with your healthcare provider. 5.  You may take Tylenol (acetaminophen), as long as the dose is less than 2000 mg (OR no more than 4 tablets of the Tylenol Extra Strengths '500mg'$  tablet) in 24 hours. 6.  You will see our pharmacist-specialist within the first 2 weeks of starting your medication. 7.  You will need to obtain routine labs around week 4 and12 weeks after starting and then 3 to 6 months after finishing Harvoni.    Scharlene Gloss, [facility] for [address],  [place]  [phone]

## 2015-11-24 NOTE — Telephone Encounter (Signed)
Called patient to inform him of ultrasound appt on 12/23/15 at 7:45 AM at [facility]. Nothing to eat or drink after midnight.

## 2015-11-29 ENCOUNTER — Ambulatory Visit: Payer: Medicare Other | Admitting: Surgery

## 2015-11-30 ENCOUNTER — Other Ambulatory Visit: Payer: Self-pay | Admitting: *Deleted

## 2015-11-30 ENCOUNTER — Encounter (HOSPITAL_COMMUNITY): Payer: Self-pay | Admitting: Surgery

## 2015-11-30 ENCOUNTER — Encounter (HOSPITAL_COMMUNITY)
Admission: RE | Disposition: A | Discharge status: Home / Self Care | Payer: Self-pay | Source: Ambulatory Visit | Attending: Surgery

## 2015-11-30 ENCOUNTER — Ambulatory Visit (HOSPITAL_COMMUNITY)
Admission: RE | Admit: 2015-11-30 | Discharge: 2015-12-01 | Disposition: A | Discharge status: Home / Self Care | Payer: Medicare Other | Source: Ambulatory Visit | Attending: Surgery | Admitting: Surgery

## 2015-11-30 DIAGNOSIS — M1990 Unspecified osteoarthritis, unspecified site: Secondary | ICD-10-CM | POA: Insufficient documentation

## 2015-11-30 DIAGNOSIS — Z79899 Other long term (current) drug therapy: Secondary | ICD-10-CM | POA: Diagnosis not present

## 2015-11-30 DIAGNOSIS — E7800 Pure hypercholesterolemia, unspecified: Secondary | ICD-10-CM | POA: Insufficient documentation

## 2015-11-30 DIAGNOSIS — E1142 Type 2 diabetes mellitus with diabetic polyneuropathy: Secondary | ICD-10-CM | POA: Insufficient documentation

## 2015-11-30 DIAGNOSIS — Z7984 Long term (current) use of oral hypoglycemic drugs: Secondary | ICD-10-CM | POA: Diagnosis not present

## 2015-11-30 DIAGNOSIS — F17210 Nicotine dependence, cigarettes, uncomplicated: Secondary | ICD-10-CM | POA: Diagnosis not present

## 2015-11-30 DIAGNOSIS — Z8673 Personal history of transient ischemic attack (TIA), and cerebral infarction without residual deficits: Secondary | ICD-10-CM | POA: Diagnosis not present

## 2015-11-30 DIAGNOSIS — Z9862 Peripheral vascular angioplasty status: Secondary | ICD-10-CM

## 2015-11-30 DIAGNOSIS — Z7902 Long term (current) use of antithrombotics/antiplatelets: Secondary | ICD-10-CM | POA: Diagnosis not present

## 2015-11-30 DIAGNOSIS — E039 Hypothyroidism, unspecified: Secondary | ICD-10-CM | POA: Insufficient documentation

## 2015-11-30 DIAGNOSIS — B182 Chronic viral hepatitis C: Secondary | ICD-10-CM | POA: Diagnosis not present

## 2015-11-30 DIAGNOSIS — I6521 Occlusion and stenosis of right carotid artery: Secondary | ICD-10-CM | POA: Insufficient documentation

## 2015-11-30 DIAGNOSIS — I63239 Cerebral infarction due to unspecified occlusion or stenosis of unspecified carotid arteries: Secondary | ICD-10-CM | POA: Diagnosis present

## 2015-11-30 DIAGNOSIS — Z7982 Long term (current) use of aspirin: Secondary | ICD-10-CM | POA: Diagnosis not present

## 2015-11-30 DIAGNOSIS — I10 Essential (primary) hypertension: Secondary | ICD-10-CM | POA: Insufficient documentation

## 2015-11-30 DIAGNOSIS — D649 Anemia, unspecified: Secondary | ICD-10-CM | POA: Insufficient documentation

## 2015-11-30 DIAGNOSIS — J449 Chronic obstructive pulmonary disease, unspecified: Secondary | ICD-10-CM | POA: Insufficient documentation

## 2015-11-30 DIAGNOSIS — I6523 Occlusion and stenosis of bilateral carotid arteries: Secondary | ICD-10-CM

## 2015-11-30 DIAGNOSIS — J3801 Paralysis of vocal cords and larynx, unilateral: Secondary | ICD-10-CM | POA: Diagnosis not present

## 2015-11-30 HISTORY — PX: PERIPHERAL VASCULAR CATHETERIZATION: SHX172C

## 2015-11-30 LAB — POCT I-STAT, CHEM 8
BUN: 18 mg/dL (ref 6–20)
Calcium, Ion: 1.12 mmol/L (ref 1.12–1.23)
Chloride: 101 mmol/L (ref 101–111)
Creatinine, Ser: 1 mg/dL (ref 0.61–1.24)
Glucose, Bld: 178 mg/dL — ABNORMAL HIGH (ref 65–99)
HCT: 24 % — ABNORMAL LOW (ref 39.0–52.0)
Hemoglobin: 8.2 g/dL — ABNORMAL LOW (ref 13.0–17.0)
Potassium: 3.9 mmol/L (ref 3.5–5.1)
Sodium: 139 mmol/L (ref 135–145)
TCO2: 27 mmol/L (ref 0–100)

## 2015-11-30 LAB — POCT ACTIVATED CLOTTING TIME: Activated Clotting Time: 301 seconds

## 2015-11-30 LAB — MRSA PCR SCREENING: MRSA by PCR: POSITIVE — AB

## 2015-11-30 LAB — GLUCOSE, CAPILLARY: Glucose-Capillary: 128 mg/dL — ABNORMAL HIGH (ref 65–99)

## 2015-11-30 SURGERY — CAROTID PTA/STENT INTERVENTION

## 2015-11-30 MED ORDER — ASPIRIN EC 325 MG PO TBEC: 325.0000 mg | DELAYED_RELEASE_TABLET | Freq: Every day | ORAL | Status: DC

## 2015-11-30 MED ORDER — PHENOL 1.4 % MT LIQD: 1.0000 | OROMUCOSAL | Status: DC | PRN

## 2015-11-30 MED ORDER — SODIUM CHLORIDE 0.9 % IV SOLN: INTRAVENOUS | Status: DC

## 2015-11-30 MED ORDER — LEVOTHYROXINE SODIUM 112 MCG PO TABS: 112.0000 ug | ORAL_TABLET | Freq: Every day | ORAL | Status: DC

## 2015-11-30 MED ORDER — GUAIFENESIN-DM 100-10 MG/5ML PO SYRP: 15.0000 mL | ORAL_SOLUTION | ORAL | Status: DC | PRN

## 2015-11-30 MED ORDER — ALBUTEROL SULFATE HFA 108 (90 BASE) MCG/ACT IN AERS: 2.0000 | INHALATION_SPRAY | RESPIRATORY_TRACT | Status: DC | PRN

## 2015-11-30 MED ORDER — MORPHINE SULFATE (PF) 2 MG/ML IV SOLN
2.0000 mg | INTRAVENOUS | Status: DC | PRN
  Administered 2015-11-30 – 2015-12-01 (×2): 2 mg via INTRAVENOUS
  Filled 2015-11-30 (×2): qty 1

## 2015-11-30 MED ORDER — BIVALIRUDIN 250 MG IV SOLR
INTRAVENOUS | Status: AC
  Filled 2015-11-30: qty 250

## 2015-11-30 MED ORDER — ONDANSETRON HCL 4 MG/2ML IJ SOLN: 4.0000 mg | Freq: Four times a day (QID) | INTRAMUSCULAR | Status: DC | PRN

## 2015-11-30 MED ORDER — SODIUM CHLORIDE 0.9 % IV SOLN
INTRAVENOUS | Status: DC
  Administered 2015-11-30: 06:00:00 via INTRAVENOUS

## 2015-11-30 MED ORDER — PNEUMOCOCCAL VAC POLYVALENT 25 MCG/0.5ML IJ INJ
0.5000 mL | INJECTION | INTRAMUSCULAR | Status: DC
  Filled 2015-11-30: qty 0.5

## 2015-11-30 MED ORDER — HEPARIN (PORCINE) IN NACL 2-0.9 UNIT/ML-% IJ SOLN
INTRAMUSCULAR | Status: DC | PRN
  Administered 2015-11-30: 1000 mL
  Administered 2015-11-30: 500 mL

## 2015-11-30 MED ORDER — DOCUSATE SODIUM 100 MG PO CAPS: 100.0000 mg | ORAL_CAPSULE | Freq: Every day | ORAL | Status: DC

## 2015-11-30 MED ORDER — NOREPINEPHRINE BITARTRATE 1 MG/ML IV SOLN
INTRAVENOUS | Status: AC
  Filled 2015-11-30: qty 4

## 2015-11-30 MED ORDER — BIVALIRUDIN 250 MG IV SOLR
250.0000 mg | INTRAVENOUS | Status: DC | PRN
  Administered 2015-11-30: 1.75 mg/kg/h via INTRAVENOUS
  Administered 2015-11-30: 250 mg

## 2015-11-30 MED ORDER — IPRATROPIUM BROMIDE 0.02 % IN SOLN
0.5000 mg | Freq: Four times a day (QID) | RESPIRATORY_TRACT | Status: DC
  Administered 2015-11-30 – 2015-12-01 (×2): 0.5 mg via RESPIRATORY_TRACT
  Filled 2015-11-30 (×3): qty 2.5

## 2015-11-30 MED ORDER — ACETAMINOPHEN 650 MG RE SUPP: 325.0000 mg | RECTAL | Status: DC | PRN

## 2015-11-30 MED ORDER — ASPIRIN 81 MG PO CHEW
CHEWABLE_TABLET | ORAL | Status: AC
  Administered 2015-11-30: 81 mg
  Filled 2015-11-30: qty 1

## 2015-11-30 MED ORDER — ACETAMINOPHEN 325 MG PO TABS
325.0000 mg | ORAL_TABLET | ORAL | Status: DC | PRN
Start: 2015-11-30 — End: 2015-12-01

## 2015-11-30 MED ORDER — FERROUS SULFATE 325 (65 FE) MG PO TABS
325.0000 mg | ORAL_TABLET | Freq: Three times a day (TID) | ORAL | Status: DC
  Administered 2015-11-30: 325 mg via ORAL
  Filled 2015-11-30: qty 1

## 2015-11-30 MED ORDER — LEDIPASVIR-SOFOSBUVIR 90-400 MG PO TABS: 1.0000 | ORAL_TABLET | Freq: Every day | ORAL | Status: DC

## 2015-11-30 MED ORDER — HYDRALAZINE HCL 20 MG/ML IJ SOLN: 5.0000 mg | INTRAMUSCULAR | Status: DC | PRN

## 2015-11-30 MED ORDER — CLOPIDOGREL BISULFATE 75 MG PO TABS
ORAL_TABLET | ORAL | Status: AC
  Administered 2015-11-30: 75 mg
  Filled 2015-11-30: qty 1

## 2015-11-30 MED ORDER — CLOPIDOGREL BISULFATE 75 MG PO TABS: 75.0000 mg | ORAL_TABLET | Freq: Every day | ORAL | Status: DC

## 2015-11-30 MED ORDER — HEPARIN (PORCINE) IN NACL 2-0.9 UNIT/ML-% IJ SOLN
INTRAMUSCULAR | Status: AC
  Filled 2015-11-30: qty 1000

## 2015-11-30 MED ORDER — LIDOCAINE HCL (PF) 1 % IJ SOLN
INTRAMUSCULAR | Status: AC
  Filled 2015-11-30: qty 30

## 2015-11-30 MED ORDER — LISINOPRIL 20 MG PO TABS: 20.0000 mg | ORAL_TABLET | Freq: Every day | ORAL | Status: DC

## 2015-11-30 MED ORDER — ATROPINE SULFATE 0.1 MG/ML IJ SOLN
INTRAMUSCULAR | Status: DC | PRN
  Administered 2015-11-30: 0.5 mg via INTRAVENOUS

## 2015-11-30 MED ORDER — AMLODIPINE BESYLATE 5 MG PO TABS
5.0000 mg | ORAL_TABLET | Freq: Every day | ORAL | Status: DC
  Administered 2015-11-30: 5 mg via ORAL
  Filled 2015-11-30: qty 1

## 2015-11-30 MED ORDER — ALBUTEROL SULFATE (2.5 MG/3ML) 0.083% IN NEBU
2.5000 mg | INHALATION_SOLUTION | RESPIRATORY_TRACT | Status: DC | PRN
  Administered 2015-11-30: 2.5 mg via RESPIRATORY_TRACT
  Filled 2015-11-30 (×2): qty 3

## 2015-11-30 MED ORDER — ALUM & MAG HYDROXIDE-SIMETH 200-200-20 MG/5ML PO SUSP: 15.0000 mL | ORAL | Status: DC | PRN

## 2015-11-30 MED ORDER — BIVALIRUDIN BOLUS VIA INFUSION - CUPID
INTRAVENOUS | Status: DC | PRN
  Administered 2015-11-30: 90.525 mg via INTRAVENOUS

## 2015-11-30 MED ORDER — CLOPIDOGREL BISULFATE 75 MG PO TABS: 75.0000 mg | ORAL_TABLET | Freq: Once | ORAL | Status: DC

## 2015-11-30 MED ORDER — METOPROLOL TARTRATE 1 MG/ML IV SOLN
2.0000 mg | INTRAVENOUS | Status: DC | PRN
  Filled 2015-11-30: qty 5

## 2015-11-30 MED ORDER — ASPIRIN 81 MG PO CHEW: 81.0000 mg | CHEWABLE_TABLET | Freq: Once | ORAL | Status: DC

## 2015-11-30 MED ORDER — METOPROLOL SUCCINATE ER 50 MG PO TB24: 50.0000 mg | ORAL_TABLET | Freq: Every day | ORAL | Status: DC

## 2015-11-30 MED ORDER — ATROPINE SULFATE 0.1 MG/ML IJ SOLN
INTRAMUSCULAR | Status: AC
  Filled 2015-11-30: qty 10

## 2015-11-30 MED ORDER — LABETALOL HCL 5 MG/ML IV SOLN: 10.0000 mg | INTRAVENOUS | Status: DC | PRN

## 2015-11-30 MED ORDER — LIDOCAINE HCL (PF) 1 % IJ SOLN
INTRAMUSCULAR | Status: DC | PRN
  Administered 2015-11-30: 12 mL

## 2015-11-30 MED ORDER — IPRATROPIUM BROMIDE HFA 17 MCG/ACT IN AERS: 2.0000 | INHALATION_SPRAY | Freq: Four times a day (QID) | RESPIRATORY_TRACT | Status: DC

## 2015-11-30 SURGICAL SUPPLY — 25 items
BALLN EMERGE MR 3.0X20 (BALLOONS) ×2
BALLN VIATRAC 5.5X20X135 (BALLOONS) ×4
BALLOON EMERGE MR 3.0X20 (BALLOONS) IMPLANT
BALLOON VIATRAC 5.5X20X135 (BALLOONS) IMPLANT
CATH ANGIO 5F BER2 100CM (CATHETERS) ×1 IMPLANT
CATH ANGIO 5F PIGTAIL 100CM (CATHETERS) ×1 IMPLANT
CATH HEADHUNTER 5F 125CM (CATHETERS) ×1 IMPLANT
CATH INFINITI VERT 5FR 125CM (CATHETERS) ×1 IMPLANT
COVER PRB 48X5XTLSCP FOLD TPE (BAG) IMPLANT
COVER PROBE 5X48 (BAG) ×2
DEVICE EMBOSHIELD NAV6 4.0-7.0 (WIRE) ×1 IMPLANT
KIT ENCORE 26 ADVANTAGE (KITS) ×1 IMPLANT
KIT MICROINTRODUCER STIFF 5F (SHEATH) ×1 IMPLANT
KIT PV (KITS) ×2 IMPLANT
SHEATH PINNACLE 5F 10CM (SHEATH) ×1 IMPLANT
SHEATH PINNACLE 6F 10CM (SHEATH) ×1 IMPLANT
SHEATH SHUTTLE SELECT 6F (SHEATH) ×1 IMPLANT
SHIELD RADPAD SCOOP 12X17 (MISCELLANEOUS) ×1 IMPLANT
STENT XACT CAR 10-8X40X136 (Permanent Stent) ×1 IMPLANT
SYR MEDRAD MARK V 150ML (SYRINGE) ×2 IMPLANT
TRANSDUCER W/STOPCOCK (MISCELLANEOUS) ×2 IMPLANT
TRAY PV CATH (CUSTOM PROCEDURE TRAY) ×2 IMPLANT
WIRE AMPLATZ SSTIFF .035X260CM (WIRE) IMPLANT
WIRE BENTSON .035X145CM (WIRE) ×1 IMPLANT
WIRE HI TORQ VERSACORE J 260CM (WIRE) ×1 IMPLANT

## 2015-11-30 NOTE — Op Note (Signed)
Patient name: Brad [name] MRN: [id number] DOB: 12/17/1955 Sex: male  11/30/2015 Pre-operative Diagnosis: Symptomatic right carotid stenosis Post-operative diagnosis:  Same Surgeon:  [name]  Co-physician: Quay Burow Procedure Performed:  1.  Ultrasound-guided access, right femoral  2.  Aortic arch angiogram  3.   Right carotid stent with distal embolic protection    Indications:  The patient has a history of left carotid endarterectomy for symptomatic left carotid stenosis.  He recently presented to the hospital with an acute stroke. Imaging studies revealed a 70% right carotid stenosis.  He was scheduled for right carotid endarterectomy, however with his hoarseness, I had him evaluated by ENT and he was found to have a left vocal cord paralysis.  Therefore he was not a candidate for surgery and so carotid stenting has been recommended.  Procedure:  The patient was identified in the holding area and taken to room 8.  The patient was then placed supine on the table and prepped and draped in the usual sterile fashion.  A time out was called.  Ultrasound was used to evaluate the right common femoral artery.  It was patent .  A digital ultrasound image was acquired.  A micropuncture needle was used to access the right common femoral artery under ultrasound guidance.  An 018 wire was advanced without resistance and a micropuncture sheath was placed.  The 018 wire was removed and a benson wire was placed.  The micropuncture sheath was exchanged for a 5 french sheath.  A pigtail catheter was advanced into the aortic arch and an aortic arch aneurysm was performed.  Next using a Berenstein 2 catheter, the right common carotid artery was selected and right carotid angiogram with intracranial images was performed.  Intracranial imaging will be separately dictated by neuroradiology.   Findings:   Aortic arch:  A type I aortic arch that identified.  Moderate calcification is noted at the  innominate artery origin.  No significant great vessel stenosis  Right Carotid artery:  Calcified 70% bifurcation stenosis    Intervention:  After the above images were acquired, the decision was made to proceed with intervention.  The 5 French sheath was upsized to a 6 [place] sheath.  Angiomax bolus and drip were administered.  Using a vertebral catheter, the innominate artery and then the right carotid artery were cannulatedwith a Cyndia Bent.   The catheter was advanced into the distal common carotid artery and the wire was exchanged out for an Amplatz wire.  The 6 French sheath was then advanced into the common carotid artery.  Once the ACT returned greater than 300, a large NAV-6  Filter was prepared and then inserted.   It was advanced across the lesion without difficulty and deployed in the distal internal carotid artery.   Because of the location of the lesion of the lateral wall, I did not feel that predilation was necessary.  We selected a XACT 10 x 8 x 40 stent.  It was positioned properly, confirmed with angiography and then deployed.  It was then molded using a 5.5 x 20 balloon.  Completion imaging revealed resolution of the stenosis and no significant change within the intracranial circulation.  Catheters and wires removed.  The long 6 French sheath was xchanged out to a short 6.  The patient was neurologically intact.  Impression:  #1  Successful right carotid stenting with distal embolic protection.  Initial stenosis was 70% post stenosis was less than 10%.  A XACT 10  x 8 x 40 stent was utilized.    Theotis Burrow, M.D. [facility] Office: [phone] Pager:  [phone]

## 2015-11-30 NOTE — Interval H&P Note (Signed)
History and Physical Interval Note:  11/30/2015 7:33 AM  Brad [name]  has presented today for surgery, with the diagnosis of right carotid stenosis  The various methods of treatment have been discussed with the patient and family. After consideration of risks, benefits and other options for treatment, the patient has consented to  Procedure(s): Carotid PTA/Stent Intervention (N/A) as a surgical intervention .  The patient's history has been reviewed, patient examined, no change in status, stable for surgery.  I have reviewed the patient's chart and labs.  Questions were answered to the patient's satisfaction.     [name]

## 2015-11-30 NOTE — H&P (View-Only) (Signed)
[facility]ist of [place]  Patient name: Brad [name] MRN: [id number] DOB: Mar 27, 1956 Sex: male  REASON FOR VISIT: Left arm numbness  HPI: Brad [name] is a 60 y.o. male who is status post left carotid endarterectomy on 11/13/2014.  He was having right-sided facial weakness and blurry vision.  He was recently in the hospital February 2017 with left leg and arm numbness and weakness as well as dizziness and amaurosis.  He had a carotid duplex that showed 60-79 percent right carotid stenosis stenosis.  He was scheduled for right carotid endarterectomy but left the hospital AMA.  He did go to see ENT for evaluation of his hoarseness.  He does have a left vocal cord paralysis which is mild.  The patient is back today stating that for the past 4 days he has been having numbness in his left arm from the elbow all the way down as well as his left leg and left face.  The patient continues to smoke, however he states that he is going to stop. He is medically managed for diabetes which has been moderately controlled he is on a statin for hypercholesterolemia and a ACE inhibitor for hypertension.  Past Medical History  Diagnosis Date  . Depression   . GERD (gastroesophageal reflux disease)   . Hyperlipidemia 08/27/2014    takes Simvastatin daily  . Hypertension     takes Amlodipine and Lisinopril daily  . Hypothyroidism 08/27/2014    takes Synthroid daily  . Anemia     takes Ferrous Sulfated daily  . Carotid stenosis 08/27/2014    takes Aspirin daily  . Diabetes mellitus type II     takes Metformin and Glipizide daily  . Muscle spasm     takes Zanaflex daily as needed  . Insomnia     takes Pamelor nightly  . COPD (chronic obstructive pulmonary disease) (HCC)     Albuterol daily as needed and Spiriva daily  . MVP (mitral valve prolapse)   . History of blood clots     in right leg;being followed by Dr.Brabham for this  . Shortness of breath dyspnea   . Peripheral  neuropathy ([place])   . Headache     occasionally  . Dizziness   . Stroke ([place])   . Arthritis   . Joint pain     hips  . Peripheral edema     takes Furosemide daily as needed  . History of hiatal hernia   . History of gastric ulcer   . Urinary frequency   . Urinary urgency   . History of blood transfusion 09/2014    2 units-no abnormal reaction noted  . HTN (hypertension) 09/14/2015  . Chronic hepatitis C ([place]) 09/17/2015  . Prurigo nodularis     Family History  Problem Relation Age of Onset  . Alcohol abuse Father   . Lung cancer Father   . Heart disease Mother     automated implantable cardioverter-defibrillator   . Diabetes type II Mother   . Lung cancer Maternal Grandmother   . Lung cancer Sister     SOCIAL HISTORY: Social History  Substance Use Topics  . Smoking status: Current Every Day Smoker -- 1.00 packs/day for 48 years    Types: Cigarettes  . Smokeless tobacco: Never Used     Comment: 1-2 pks per day  . Alcohol Use: 0.6 oz/week    1 Cans of beer per week     Comment: beer monthly    Allergies  Allergen Reactions  . Doxycycline Hives and Itching  . Lipitor [Atorvastatin] Other (See Comments)    Myalgia   . Oxycodone Itching    Current Outpatient Prescriptions  Medication Sig Dispense Refill  . amLODipine (NORVASC) 5 MG tablet Take 1 tablet (5 mg total) by mouth daily. 90 tablet 3  . aspirin EC 325 MG tablet Take 1 tablet (325 mg total) by mouth daily. 30 tablet 0  . Blood Pressure Monitor DEVI Use to check Blood pressure daily 1 Device 0  . clopidogrel (PLAVIX) 75 MG tablet Take 1 tablet (75 mg total) by mouth daily. 30 tablet 11  . ferrous sulfate 325 (65 FE) MG tablet Take 1 tablet (325 mg total) by mouth 3 (three) times daily with meals. 30 tablet 3  . furosemide (LASIX) 40 MG tablet TAKE 1 TABLET BY MOUTH IN THE MORNING,WITH A SECOND DOSE IN THE EVENING AS NEEDED ONLY FOR SWELLING 60 tablet 2  . glucose blood (ACCU-CHEK AVIVA PLUS) test strip Use to  check blood sugars daily Dx E11.9 100 each 3  . ipratropium (ATROVENT HFA) 17 MCG/ACT inhaler Inhale 2 puffs into the lungs 4 (four) times daily. 3 Inhaler 3  . Lancets MISC 1 each by Other route See admin instructions. Reported on 09/07/2015    . levothyroxine (SYNTHROID, LEVOTHROID) 112 MCG tablet Take 1 tablet (112 mcg total) by mouth daily. 90 tablet 3  . lisinopril (PRINIVIL,ZESTRIL) 20 MG tablet Take 1 tablet (20 mg total) by mouth daily. 90 tablet 0  . metFORMIN (GLUCOPHAGE-XR) 500 MG 24 hr tablet 3 tabs by mouth in the AM (Patient taking differently: Take 1,500 mg by mouth daily with breakfast. Only taking 2 tablets) 270 tablet 3  . metoprolol succinate (TOPROL-XL) 50 MG 24 hr tablet Take 1 tablet (50 mg total) by mouth daily. Take with or immediately following a meal. 90 tablet 3  . PROAIR HFA 108 (90 BASE) MCG/ACT inhaler INHALE 2 PUFFS BY MOUTH EVERY 6 HOURS AS NEEDED FOR WHEEZING OR SHORTNESS OF BREATH 8.5 g 11  . Respiratory Therapy Supplies (NEBULIZER) DEVI Use as directed four times daily 1 each 0  . tiZANidine (ZANAFLEX) 4 MG tablet TAKE 1 TABLET BY MOUTH EVERY 6 HOURS AS NEEDED FOR MUSCLE SPASMS 60 tablet 0  . traMADol (ULTRAM) 50 MG tablet TAKE 1-2 TABLETS BY MOUTH UP TO FOUR TIMES DAILY 120 tablet 5   No current facility-administered medications for this visit.    REVIEW OF SYSTEMS:  '[X]'$  denotes positive finding, '[ ]'$  denotes negative finding Cardiac  Comments:  Chest pain or chest pressure:    Shortness of breath upon exertion: x   Short of breath when lying flat: x   Irregular heart rhythm:        Vascular    Pain in calf, thigh, or hip brought on by ambulation: x   Pain in feet at night that wakes you up from your sleep:     Blood clot in your veins:    Leg swelling:         Pulmonary    Oxygen at home:    Productive cough:  x   Wheezing:  x       Neurologic    Sudden weakness in arms or legs:     Sudden numbness in arms or legs:     Sudden onset of  difficulty speaking or slurred speech:    Temporary loss of vision in one eye:     Problems with dizziness:  Gastrointestinal    Blood in stool:     Vomited blood:         Genitourinary    Burning when urinating:     Blood in urine:        Psychiatric    Major depression:         Hematologic    Bleeding problems:    Problems with blood clotting too easily:        Skin    Rashes or ulcers:        Constitutional    Fever or chills: x     PHYSICAL EXAM: Filed Vitals:   11/22/15 0933 11/22/15 0937  BP: 144/62 141/65  Pulse: 64 64  Temp: 97.1 F (36.2 C)   TempSrc: Oral   Resp: 20   Height: '5\' 11"'$  (1.803 m)   Weight: 268 lb (121.564 kg)   SpO2: 99%     GENERAL: The patient is a well-nourished male, in no acute distress. The vital signs are documented above. CARDIAC: There is a regular rate and rhythm.  PULMONARY: There is good air exchange bilaterally without wheezing or rales. ABDOMEN: Soft and non-tender with normal pitched bowel sounds.  MUSCULOSKELETAL: There are no major deformities or cyanosis. NEUROLOGIC: Numbness left arm left leg. SKIN: There are no ulcers or rashes noted. PSYCHIATRIC: The patient has a normal affect.  DATA:  I have again reviewed the patient's carotid duplex which shows 60-79% stenosis on the right  MEDICAL ISSUES: I discussed with the patient and his wife, that he needs to have the right carotid stenosis addressed.  Because of the vocal cord paralysis on the left, I think he has to undergo right carotid stenting.  We discussed the risks and benefits of the procedure including the risks of access site complications, stroke, and cardio-pulmonary complications.  All his questions were answered.  He will continue to take his aspirin and Plavix daily.  I have put him on the schedule for Tuesday, April 25.  He will call me or go to the emergency department if he has any more symptoms    Brabham, [place] [facility]  [place]: [phone]

## 2015-11-30 NOTE — Progress Notes (Signed)
Site area: Right groin a 5 french arterial sheath was removed  Site Prior to Removal:  Level 0  Pressure Applied For 15 MINUTES    Bedrest Beginning at 1130a  Manual:   Yes.    Patient Status During Pull:  stable  Post Pull Groin Site:  Level 0  Post Pull Instructions Given:  Yes.    Post Pull Pulses Present:  Yes.    Dressing Applied:  Yes.    Comments:  VS remain stable during sheath pull.

## 2015-12-01 ENCOUNTER — Encounter (HOSPITAL_COMMUNITY): Payer: Self-pay | Admitting: *Deleted

## 2015-12-01 ENCOUNTER — Telehealth: Payer: Self-pay | Admitting: Surgery

## 2015-12-01 DIAGNOSIS — I6521 Occlusion and stenosis of right carotid artery: Secondary | ICD-10-CM | POA: Diagnosis not present

## 2015-12-01 MED ORDER — METFORMIN HCL ER 500 MG PO TB24: 1000.0000 mg | ORAL_TABLET | Freq: Every day | ORAL | Status: DC

## 2015-12-01 MED FILL — Norepinephrine Bitartrate IV Soln 1 MG/ML (Base Equivalent): INTRAVENOUS | Qty: 8 | Status: AC

## 2015-12-01 NOTE — Progress Notes (Signed)
Provided discharge instructions to pt and wife. Forms signed and copies provided. IV's removed.

## 2015-12-01 NOTE — Telephone Encounter (Signed)
sched lab for 6/1 at 1 and md on 6/5 at 11:45. Spoke to pt to inform them of appt.

## 2015-12-01 NOTE — Telephone Encounter (Signed)
-----   Message from Mena Goes, RN sent at 11/30/2015 10:16 AM EDT ----- Regarding: schedule   ----- Message -----    From: Serafina Mitchell, MD    Sent: 11/30/2015   9:05 AM      To: Vvs Charge Pool  11/30/2015:  Surgeon:  [name]  Co-physician: Quay Burow Procedure Performed:  1.  Ultrasound-guided access, right femoral  2.  Aortic arch angiogram  3.   Right carotid stent with distal embolic protection   I am the billing physician for all portions of the procedure.  The patient will need to follow-up with me in one month with a repeat carotid duplex

## 2015-12-01 NOTE — Progress Notes (Signed)
  [facility] Progress Note  Subjective  Feels like his right neck is sore. Ready to go home.   Objective Filed Vitals:   12/01/15 0355 12/01/15 0359  BP: 129/55   Pulse: 69   Temp:  99 F (37.2 C)  Resp: 22     Intake/Output Summary (Last 24 hours) at 12/01/15 0733 Last data filed at 12/01/15 0400  Gross per 24 hour  Intake 1001.67 ml  Output   1975 ml  Net -973.33 ml   Right groin soft without hematoma. 5/5 strength upper and lower extremities.    Assessment/Planning: 60 y.o. male is s/p: right carotid stenting 1 Day Post-Op   Groin without hematoma. Neuro exam intact.  He is on ASA and plavix. Has ambulated.  D/c home today. Follow-up in 4 weeks with duplex.    Brad [name] 12/01/2015 7:33 AM --  Laboratory CBC    Component Value Date/Time   WBC 5.7 09/28/2015 0605   HGB 8.2* 11/30/2015 0618   HCT 24.0* 11/30/2015 0618   PLT 223 09/28/2015 0605    BMET    Component Value Date/Time   NA 139 11/30/2015 0618   K 3.9 11/30/2015 0618   CL 101 11/30/2015 0618   CO2 30 09/29/2015 0530   GLUCOSE 178* 11/30/2015 0618   BUN 18 11/30/2015 0618   CREATININE 1.00 11/30/2015 0618   CALCIUM 8.7* 09/29/2015 0530   GFRNONAA >60 09/29/2015 0530   GFRAA >60 09/29/2015 0530    COAG Lab Results  Component Value Date   INR 1.11 09/27/2015   INR 1.09 11/13/2014   INR 1.07 10/21/2014   No results found for: PTT  Antibiotics Anti-infectives    Start     Dose/Rate Route Frequency Ordered Stop   11/30/15 1500  Ledipasvir-Sofosbuvir 90-400 MG TABS 1 tablet     1 tablet Oral Daily 11/30/15 1456         Virgina Jock, PA-C [facility] Office: [phone] Pager: [phone] 12/01/2015 7:33 AM     Stable for d/c  F/u in 1 month with duplex  Brad [name]

## 2015-12-02 ENCOUNTER — Encounter (HOSPITAL_COMMUNITY): Payer: Self-pay | Admitting: Surgery

## 2015-12-06 ENCOUNTER — Other Ambulatory Visit: Payer: Self-pay | Admitting: *Deleted

## 2015-12-06 DIAGNOSIS — K922 Gastrointestinal hemorrhage, unspecified: Secondary | ICD-10-CM

## 2015-12-06 HISTORY — DX: Gastrointestinal hemorrhage, unspecified: K92.2

## 2015-12-06 NOTE — Patient Outreach (Signed)
Call placed to member to follow up on carotid stenting last week.  He reports that the procedure "went good."  He denies any complications since.  He state that he still has some tingling on his left side, but state that it is getting better.  He report that he has been taking all of his medications as prescribed, denies questions.  He state that his blood sugar today is 113, but denies checking his weight or blood pressure.  He does report continued swelling in his legs/feet, but state he will discuss with [name] tomorrow at his office visit.  He denies any other concerns at this time, home visit scheduled for next week.  [name], BSN, [facility]  [facility] Care Manager [phone]

## 2015-12-07 ENCOUNTER — Ambulatory Visit (INDEPENDENT_AMBULATORY_CARE_PROVIDER_SITE_OTHER): Payer: Medicare Other | Admitting: Internal Medicine

## 2015-12-07 ENCOUNTER — Other Ambulatory Visit (INDEPENDENT_AMBULATORY_CARE_PROVIDER_SITE_OTHER): Payer: Medicare Other

## 2015-12-07 ENCOUNTER — Encounter: Payer: Self-pay | Admitting: Internal Medicine

## 2015-12-07 ENCOUNTER — Telehealth: Payer: Self-pay | Admitting: Emergency Medicine

## 2015-12-07 VITALS — BP 150/70 | HR 68 | Temp 98.4°F | Resp 20 | Wt 276.0 lb

## 2015-12-07 DIAGNOSIS — I639 Cerebral infarction, unspecified: Secondary | ICD-10-CM

## 2015-12-07 DIAGNOSIS — N320 Bladder-neck obstruction: Secondary | ICD-10-CM

## 2015-12-07 DIAGNOSIS — I10 Essential (primary) hypertension: Secondary | ICD-10-CM

## 2015-12-07 DIAGNOSIS — G4710 Hypersomnia, unspecified: Secondary | ICD-10-CM

## 2015-12-07 DIAGNOSIS — E039 Hypothyroidism, unspecified: Secondary | ICD-10-CM | POA: Diagnosis not present

## 2015-12-07 DIAGNOSIS — E1159 Type 2 diabetes mellitus with other circulatory complications: Secondary | ICD-10-CM

## 2015-12-07 DIAGNOSIS — E785 Hyperlipidemia, unspecified: Secondary | ICD-10-CM

## 2015-12-07 DIAGNOSIS — I5032 Chronic diastolic (congestive) heart failure: Secondary | ICD-10-CM

## 2015-12-07 DIAGNOSIS — I5030 Unspecified diastolic (congestive) heart failure: Secondary | ICD-10-CM | POA: Insufficient documentation

## 2015-12-07 LAB — CBC WITH DIFFERENTIAL/PLATELET
Basophils Absolute: 0.1 10*3/uL (ref 0.0–0.1)
Basophils Relative: 1 % (ref 0.0–3.0)
Eosinophils Absolute: 0.2 10*3/uL (ref 0.0–0.7)
Eosinophils Relative: 2.2 % (ref 0.0–5.0)
HCT: 21.1 % — CL (ref 39.0–52.0)
Hemoglobin: 6.6 g/dL — CL (ref 13.0–17.0)
Lymphocytes Relative: 19.9 % (ref 12.0–46.0)
Lymphs Abs: 1.8 10*3/uL (ref 0.7–4.0)
MCHC: 31.4 g/dL (ref 30.0–36.0)
MCV: 69.1 fl — ABNORMAL LOW (ref 78.0–100.0)
Monocytes Absolute: 0.8 10*3/uL (ref 0.1–1.0)
Monocytes Relative: 8.8 % (ref 3.0–12.0)
Neutro Abs: 6.1 10*3/uL (ref 1.4–7.7)
Neutrophils Relative %: 68.1 % (ref 43.0–77.0)
Platelets: 399 10*3/uL (ref 150.0–400.0)
RBC: 3.05 Mil/uL — ABNORMAL LOW (ref 4.22–5.81)
RDW: 18.7 % — ABNORMAL HIGH (ref 11.5–15.5)
WBC: 9 10*3/uL (ref 4.0–10.5)

## 2015-12-07 LAB — HEMOGLOBIN A1C: Hgb A1c MFr Bld: 5.7 % (ref 4.6–6.5)

## 2015-12-07 LAB — LIPID PANEL
Cholesterol: 134 mg/dL (ref 0–200)
HDL: 24.9 mg/dL — ABNORMAL LOW (ref >39.00)
LDL Cholesterol: 80 mg/dL (ref 0–99)
NonHDL: 108.9
Total CHOL/HDL Ratio: 5
Triglycerides: 146 mg/dL (ref 0.0–149.0)
VLDL: 29.2 mg/dL (ref 0.0–40.0)

## 2015-12-07 LAB — HEPATIC FUNCTION PANEL
ALT: 10 U/L (ref 0–53)
AST: 19 U/L (ref 0–37)
Albumin: 2.5 g/dL — ABNORMAL LOW (ref 3.5–5.2)
Alkaline Phosphatase: 146 U/L — ABNORMAL HIGH (ref 39–117)
Bilirubin, Direct: 0.1 mg/dL (ref 0.0–0.3)
Total Bilirubin: 0.3 mg/dL (ref 0.2–1.2)
Total Protein: 5.7 g/dL — ABNORMAL LOW (ref 6.0–8.3)

## 2015-12-07 LAB — BASIC METABOLIC PANEL
BUN: 14 mg/dL (ref 6–23)
CO2: 30 mEq/L (ref 19–32)
Calcium: 8.5 mg/dL (ref 8.4–10.5)
Chloride: 103 mEq/L (ref 96–112)
Creatinine, Ser: 1.08 mg/dL (ref 0.40–1.50)
GFR: 74.62 mL/min (ref >60.00)
Glucose, Bld: 103 mg/dL — ABNORMAL HIGH (ref 70–99)
Potassium: 4.1 mEq/L (ref 3.5–5.1)
Sodium: 140 mEq/L (ref 135–145)

## 2015-12-07 LAB — TSH: TSH: 4.7 u[IU]/mL — ABNORMAL HIGH (ref 0.35–4.50)

## 2015-12-07 MED ORDER — FUROSEMIDE 80 MG PO TABS: 80.0000 mg | ORAL_TABLET | Freq: Two times a day (BID) | ORAL | Status: DC

## 2015-12-07 NOTE — Progress Notes (Signed)
Subjective:    Patient ID: Brad [name], male    DOB: [birth year], 57 y.o.   MRN: [id number]  HPI  /Here for yearly f/u;  Overall doing ok;  Pt denies Chest pain, worsening SOB, DOE, wheezing, orthopnea, PND, worsening LE edema, palpitations, dizziness or syncope, except for worsening LE edema x 2 mo, makes difficult to walk, and makes unsteady in the shower.. Has echo feb 2016 with normal EF, and gr 2 diast dysfxn..  Pt denies neurological change such as new headache, facial or extremity weakness.  Pt denies polydipsia, polyuria, or low sugar symptoms. Pt states overall good compliance with treatment and medications, good tolerability, and has been trying to follow appropriate diet.  Pt denies worsening depressive symptoms, suicidal ideation or panic. No fever, night sweats, wt loss, loss of appetite, or other constitutional symptoms.  Pt states good ability with ADL's, has low fall risk, home safety reviewed and adequate, no other significant changes in hearing or vision, and only occasionally active with exercise.   Does c/o ongoing fatigue, and has  signficant daytime hypersomnolence, not been eval for OSA in the past.  Tends to sleep about 12 hrs per day.  Wife with recent new onset CHF.  Pt BP abour 140 at home per pt Past Medical History  Diagnosis Date  . Depression   . GERD (gastroesophageal reflux disease)   . Hyperlipidemia 08/27/2014    takes Simvastatin daily  . Hypertension     takes Amlodipine and Lisinopril daily  . Hypothyroidism 08/27/2014    takes Synthroid daily  . Anemia     takes Ferrous Sulfated daily  . Carotid stenosis 08/27/2014    takes Aspirin daily  . Diabetes mellitus type II     takes Metformin and Glipizide daily  . Muscle spasm     takes Zanaflex daily as needed  . Insomnia     takes Pamelor nightly  . COPD (chronic obstructive pulmonary disease) (HCC)     Albuterol daily as needed and Spiriva daily  . MVP (mitral valve prolapse)   . History of blood  clots     in right leg;being followed by Dr.Brabham for this  . Shortness of breath dyspnea   . Peripheral neuropathy ([place])   . Headache     occasionally  . Dizziness   . Stroke ([place])   . Arthritis   . Joint pain     hips  . Peripheral edema     takes Furosemide daily as needed  . History of hiatal hernia   . History of gastric ulcer   . Urinary frequency   . Urinary urgency   . History of blood transfusion 09/2014    2 units-no abnormal reaction noted  . HTN (hypertension) 09/14/2015  . Chronic hepatitis C ([place]) 09/17/2015  . Prurigo nodularis    Past Surgical History  Procedure Laterality Date  . Neg stress test  2000  . Rotator cuff repair Right 2009    multiple f/u Sxs/infection Tx  . Incision and drainage perirectal abscess  03/2009  . Esophagogastroduodenoscopy N/A 09/24/2014    Procedure: ESOPHAGOGASTRODUODENOSCOPY (EGD);  Surgeon: Winfield Cunas., MD;  Location: [facility] ENDOSCOPY;  Service: Endoscopy;  Laterality: N/A;  . I&d of right arm    . Endarterectomy Left 11/13/2014    Procedure: ENDARTERECTOMY CAROTID WITH PATCH ANGIOPLASTY;  Surgeon: Serafina Mitchell, MD;  Location[facility];  Service: Vascular;  Laterality: Left;  . Peripheral vascular catheterization N/A 11/30/2015  Procedure: Carotid PTA/Stent Intervention;  Surgeon: Serafina Mitchell, MD;  Location: [place] CV LAB;  Service: Cardiovascular;  Laterality: N/A;    reports that he has been smoking Cigarettes.  He has a 48 pack-year smoking history. He has never used smokeless tobacco. He reports that he drinks about 0.6 oz of alcohol per week. He reports that he does not use illicit drugs. family history includes Alcohol abuse in his father; Diabetes type II in his mother; Heart disease in his mother; Lung cancer in his father, maternal grandmother, and sister. Allergies  Allergen Reactions  . Doxycycline Hives and Itching  . Lipitor [Atorvastatin] Other (See Comments)    Myalgia   . Oxycodone Itching   Current  Outpatient Prescriptions on File Prior to Visit  Medication Sig Dispense Refill  . amLODipine (NORVASC) 5 MG tablet Take 1 tablet (5 mg total) by mouth daily. 90 tablet 3  . aspirin EC 325 MG tablet Take 1 tablet (325 mg total) by mouth daily. 30 tablet 0  . Blood Pressure Monitor DEVI Use to check Blood pressure daily 1 Device 0  . clopidogrel (PLAVIX) 75 MG tablet Take 1 tablet (75 mg total) by mouth daily. 30 tablet 11  . ferrous sulfate 325 (65 FE) MG tablet Take 1 tablet (325 mg total) by mouth 3 (three) times daily with meals. 30 tablet 3  . furosemide (LASIX) 40 MG tablet TAKE 1 TABLET BY MOUTH IN THE MORNING,WITH A SECOND DOSE IN THE EVENING AS NEEDED ONLY FOR SWELLING 60 tablet 2  . glucose blood (ACCU-CHEK AVIVA PLUS) test strip Use to check blood sugars daily Dx E11.9 100 each 3  . ipratropium (ATROVENT HFA) 17 MCG/ACT inhaler Inhale 2 puffs into the lungs 4 (four) times daily. 3 Inhaler 3  . Lancets MISC 1 each by Other route See admin instructions. Reported on 09/07/2015    . Ledipasvir-Sofosbuvir (HARVONI) 90-400 MG TABS Take 1 tablet by mouth daily. 28 tablet 2  . levothyroxine (SYNTHROID, LEVOTHROID) 112 MCG tablet Take 1 tablet (112 mcg total) by mouth daily. 90 tablet 3  . lisinopril (PRINIVIL,ZESTRIL) 20 MG tablet Take 1 tablet (20 mg total) by mouth daily. 90 tablet 0  . metFORMIN (GLUCOPHAGE-XR) 500 MG 24 hr tablet Take 2 tablets (1,000 mg total) by mouth daily with breakfast. 270 tablet 3  . metoprolol succinate (TOPROL-XL) 50 MG 24 hr tablet Take 1 tablet (50 mg total) by mouth daily. Take with or immediately following a meal. 90 tablet 3  . PROAIR HFA 108 (90 BASE) MCG/ACT inhaler INHALE 2 PUFFS BY MOUTH EVERY 6 HOURS AS NEEDED FOR WHEEZING OR SHORTNESS OF BREATH 8.5 g 11  . Respiratory Therapy Supplies (NEBULIZER) DEVI Use as directed four times daily 1 each 0  . tiZANidine (ZANAFLEX) 4 MG tablet TAKE 1 TABLET BY MOUTH EVERY 6 HOURS AS NEEDED FOR MUSCLE SPASMS 60 tablet 0    . traMADol (ULTRAM) 50 MG tablet TAKE 1-2 TABLETS BY MOUTH UP TO FOUR TIMES DAILY 120 tablet 5   No current facility-administered medications on file prior to visit.   Review of Systems Constitutional: Negative for increased diaphoresis, or other activity, appetite or siginficant weight change other than noted HENT: Negative for worsening hearing loss, ear pain, facial swelling, mouth sores and neck stiffness.   Eyes: Negative for other worsening pain, redness or visual disturbance.  Respiratory: Negative for choking or stridor Cardiovascular: Negative for other chest pain and palpitations.  Gastrointestinal: Negative for worsening diarrhea, blood in  stool, or abdominal distention Genitourinary: Negative for hematuria, flank pain or change in urine volume.  Musculoskeletal: Negative for myalgias or other joint complaints.  Skin: Negative for other color change and wound or drainage.  Neurological: Negative for syncope and numbness. other than noted Hematological: Negative for adenopathy. or other swelling Psychiatric/Behavioral: Negative for hallucinations, SI, self-injury, decreased concentration or other worsening agitation.      Objective:   Physical Exam BP 150/70 mmHg  Pulse 68  Temp(Src) 98.4 F (36.9 C) (Oral)  Resp 20  Wt 276 lb (125.193 kg)  SpO2 99% Sleeping as I come in the exam room VS noted,  Constitutional: Pt is oriented to person, place, and time. Appears well-developed and well-nourished, in no significant distress Head: Normocephalic and atraumatic  Eyes: Conjunctivae and EOM are normal. Pupils are equal, round, and reactive to light Right Ear: External ear normal.  Left Ear: External ear normal Nose: Nose normal.  Mouth/Throat: Oropharynx is clear and moist  Neck: Normal range of motion. Neck supple. No JVD present. No tracheal deviation present or significant neck LA or mass Cardiovascular: Normal rate, regular rhythm, normal heart sounds and intact distal  pulses.   Pulmonary/Chest: Effort normal and breath sounds without rales or wheezing  Abdominal: Soft. Bowel sounds are normal. NT. No HSM  Musculoskeletal: Normal range of motion. Lymphadenopathy: Has no cervical adenopathy.  Neurological: Pt is alert and oriented to person, place, and time. Pt has normal reflexes. No cranial nerve deficit. Motor grossly intact Skin: Skin is warm and dry. No rash noted or new ulcers Psychiatric:  Has normal mood and affect. Behavior is normal.  2+ edema to mid bilat thighs Bruise to right medial thigh due to recent groin procedure    Assessment & Plan:

## 2015-12-07 NOTE — Telephone Encounter (Signed)
Lab called with Critical Hgb at 6.6. Per Dr [name], Pt needs to be called and go to the ED for transfusion.

## 2015-12-07 NOTE — Telephone Encounter (Signed)
Patient is aware of lab results, patient states that he just got home and will go to the hospital

## 2015-12-07 NOTE — Patient Instructions (Addendum)
OK to increase the lasix to 80 mg twice per day  Please continue all other medications as before, and refills have been done if requested.  Please have the pharmacy call with any other refills you may need.  Please continue your efforts at being more active, low cholesterol diet, and weight control.  You are otherwise up to date with prevention measures today.  You will be contacted regarding the referral for: pulmonary  Please keep your appointments with your specialists as you may have planned  Please go to the LAB in the Basement (turn left off the elevator) for the tests to be done today  You will be contacted by phone if any changes need to be made immediately.  Otherwise, you will receive a letter about your results with an explanation, but please check with MyChart first.  Please remember to sign up for MyChart if you have not done so, as this will be important to you in the future with finding out test results, communicating by private email, and scheduling acute appointments online when needed.  Please return in 2 weeks, or sooner if needed

## 2015-12-07 NOTE — Progress Notes (Signed)
Pre visit review using our clinic review tool, if applicable. No additional management support is needed unless otherwise documented below in the visit note. 

## 2015-12-08 ENCOUNTER — Encounter (HOSPITAL_COMMUNITY): Payer: Self-pay | Admitting: Family Medicine

## 2015-12-08 ENCOUNTER — Emergency Department (HOSPITAL_COMMUNITY): Payer: Medicare Other

## 2015-12-08 ENCOUNTER — Observation Stay (HOSPITAL_COMMUNITY)
Admission: EM | Admit: 2015-12-08 | Discharge: 2015-12-09 | Disposition: A | Discharge status: Home / Self Care | Payer: Medicare Other | Attending: Internal Medicine | Admitting: Internal Medicine

## 2015-12-08 DIAGNOSIS — E1142 Type 2 diabetes mellitus with diabetic polyneuropathy: Secondary | ICD-10-CM | POA: Insufficient documentation

## 2015-12-08 DIAGNOSIS — Z6837 Body mass index (BMI) 37.0-37.9, adult: Secondary | ICD-10-CM | POA: Insufficient documentation

## 2015-12-08 DIAGNOSIS — Z7902 Long term (current) use of antithrombotics/antiplatelets: Secondary | ICD-10-CM | POA: Diagnosis not present

## 2015-12-08 DIAGNOSIS — E46 Unspecified protein-calorie malnutrition: Secondary | ICD-10-CM | POA: Insufficient documentation

## 2015-12-08 DIAGNOSIS — B182 Chronic viral hepatitis C: Secondary | ICD-10-CM | POA: Insufficient documentation

## 2015-12-08 DIAGNOSIS — Z8673 Personal history of transient ischemic attack (TIA), and cerebral infarction without residual deficits: Secondary | ICD-10-CM | POA: Diagnosis not present

## 2015-12-08 DIAGNOSIS — I110 Hypertensive heart disease with heart failure: Secondary | ICD-10-CM | POA: Insufficient documentation

## 2015-12-08 DIAGNOSIS — K269 Duodenal ulcer, unspecified as acute or chronic, without hemorrhage or perforation: Secondary | ICD-10-CM | POA: Diagnosis not present

## 2015-12-08 DIAGNOSIS — I5033 Acute on chronic diastolic (congestive) heart failure: Secondary | ICD-10-CM | POA: Diagnosis not present

## 2015-12-08 DIAGNOSIS — G4710 Hypersomnia, unspecified: Secondary | ICD-10-CM | POA: Diagnosis present

## 2015-12-08 DIAGNOSIS — E119 Type 2 diabetes mellitus without complications: Secondary | ICD-10-CM | POA: Insufficient documentation

## 2015-12-08 DIAGNOSIS — E785 Hyperlipidemia, unspecified: Secondary | ICD-10-CM | POA: Insufficient documentation

## 2015-12-08 DIAGNOSIS — K922 Gastrointestinal hemorrhage, unspecified: Principal | ICD-10-CM | POA: Insufficient documentation

## 2015-12-08 DIAGNOSIS — Z7982 Long term (current) use of aspirin: Secondary | ICD-10-CM | POA: Diagnosis not present

## 2015-12-08 DIAGNOSIS — R0602 Shortness of breath: Secondary | ICD-10-CM | POA: Diagnosis not present

## 2015-12-08 DIAGNOSIS — D649 Anemia, unspecified: Secondary | ICD-10-CM | POA: Insufficient documentation

## 2015-12-08 DIAGNOSIS — I119 Hypertensive heart disease without heart failure: Secondary | ICD-10-CM | POA: Diagnosis present

## 2015-12-08 DIAGNOSIS — F17210 Nicotine dependence, cigarettes, uncomplicated: Secondary | ICD-10-CM | POA: Diagnosis not present

## 2015-12-08 DIAGNOSIS — K219 Gastro-esophageal reflux disease without esophagitis: Secondary | ICD-10-CM | POA: Diagnosis not present

## 2015-12-08 DIAGNOSIS — I739 Peripheral vascular disease, unspecified: Secondary | ICD-10-CM

## 2015-12-08 DIAGNOSIS — E669 Obesity, unspecified: Secondary | ICD-10-CM | POA: Diagnosis not present

## 2015-12-08 DIAGNOSIS — E039 Hypothyroidism, unspecified: Secondary | ICD-10-CM | POA: Diagnosis not present

## 2015-12-08 DIAGNOSIS — R195 Other fecal abnormalities: Secondary | ICD-10-CM | POA: Diagnosis not present

## 2015-12-08 DIAGNOSIS — D638 Anemia in other chronic diseases classified elsewhere: Secondary | ICD-10-CM | POA: Diagnosis present

## 2015-12-08 DIAGNOSIS — Z9889 Other specified postprocedural states: Secondary | ICD-10-CM

## 2015-12-08 DIAGNOSIS — I70219 Atherosclerosis of native arteries of extremities with intermittent claudication, unspecified extremity: Secondary | ICD-10-CM | POA: Diagnosis present

## 2015-12-08 DIAGNOSIS — Z7984 Long term (current) use of oral hypoglycemic drugs: Secondary | ICD-10-CM | POA: Insufficient documentation

## 2015-12-08 DIAGNOSIS — J449 Chronic obstructive pulmonary disease, unspecified: Secondary | ICD-10-CM | POA: Diagnosis not present

## 2015-12-08 HISTORY — DX: Gastrointestinal hemorrhage, unspecified: K92.2

## 2015-12-08 LAB — RETICULOCYTES
RBC.: 3.03 MIL/uL — ABNORMAL LOW (ref 4.22–5.81)
Retic Count, Absolute: 130.3 10*3/uL (ref 19.0–186.0)
Retic Ct Pct: 4.3 % — ABNORMAL HIGH (ref 0.4–3.1)

## 2015-12-08 LAB — BASIC METABOLIC PANEL
Anion gap: 9 (ref 5–15)
BUN: 10 mg/dL (ref 6–20)
CO2: 25 mmol/L (ref 22–32)
Calcium: 8.2 mg/dL — ABNORMAL LOW (ref 8.9–10.3)
Chloride: 105 mmol/L (ref 101–111)
Creatinine, Ser: 1.08 mg/dL (ref 0.61–1.24)
GFR calc Af Amer: >60 mL/min (ref >60)
GFR calc non Af Amer: >60 mL/min (ref >60)
Glucose, Bld: 222 mg/dL — ABNORMAL HIGH (ref 65–99)
Potassium: 3.9 mmol/L (ref 3.5–5.1)
Sodium: 139 mmol/L (ref 135–145)

## 2015-12-08 LAB — BRAIN NATRIURETIC PEPTIDE: B Natriuretic Peptide: 759.1 pg/mL — ABNORMAL HIGH (ref 0.0–100.0)

## 2015-12-08 LAB — GLUCOSE, CAPILLARY
Glucose-Capillary: 121 mg/dL — ABNORMAL HIGH (ref 65–99)
Glucose-Capillary: 100 mg/dL — ABNORMAL HIGH (ref 65–99)
Glucose-Capillary: 100 mg/dL — ABNORMAL HIGH (ref 65–99)

## 2015-12-08 LAB — CBC
HCT: 22.6 % — ABNORMAL LOW (ref 39.0–52.0)
HCT: 22.5 % — ABNORMAL LOW (ref 39.0–52.0)
Hemoglobin: 6.3 g/dL — CL (ref 13.0–17.0)
Hemoglobin: 6.2 g/dL — CL (ref 13.0–17.0)
MCH: 20.9 pg — ABNORMAL LOW (ref 26.0–34.0)
MCH: 20.5 pg — ABNORMAL LOW (ref 26.0–34.0)
MCHC: 27.9 g/dL — ABNORMAL LOW (ref 30.0–36.0)
MCHC: 27.6 g/dL — ABNORMAL LOW (ref 30.0–36.0)
MCV: 74.8 fL — ABNORMAL LOW (ref 78.0–100.0)
MCV: 74.3 fL — ABNORMAL LOW (ref 78.0–100.0)
Platelets: 324 10*3/uL (ref 150–400)
Platelets: 303 10*3/uL (ref 150–400)
RBC: 3.02 MIL/uL — ABNORMAL LOW (ref 4.22–5.81)
RBC: 3.03 MIL/uL — ABNORMAL LOW (ref 4.22–5.81)
RDW: 16.6 % — ABNORMAL HIGH (ref 11.5–15.5)
RDW: 16.5 % — ABNORMAL HIGH (ref 11.5–15.5)
WBC: 6.9 10*3/uL (ref 4.0–10.5)
WBC: 7.4 10*3/uL (ref 4.0–10.5)

## 2015-12-08 LAB — VITAMIN B12: Vitamin B-12: 402 pg/mL (ref 180–914)

## 2015-12-08 LAB — HEMOGLOBIN AND HEMATOCRIT, BLOOD
HCT: 25.1 % — ABNORMAL LOW (ref 39.0–52.0)
Hemoglobin: 7.6 g/dL — ABNORMAL LOW (ref 13.0–17.0)

## 2015-12-08 LAB — I-STAT TROPONIN, ED: Troponin i, poc: 0.02 ng/mL (ref 0.00–0.08)

## 2015-12-08 LAB — HEPATIC FUNCTION PANEL
ALT: 13 U/L — ABNORMAL LOW (ref 17–63)
AST: 20 U/L (ref 15–41)
Albumin: 1.8 g/dL — ABNORMAL LOW (ref 3.5–5.0)
Alkaline Phosphatase: 131 U/L — ABNORMAL HIGH (ref 38–126)
Bilirubin, Direct: <0.1 mg/dL — ABNORMAL LOW (ref 0.1–0.5)
Total Bilirubin: 0.3 mg/dL (ref 0.3–1.2)
Total Protein: 5.8 g/dL — ABNORMAL LOW (ref 6.5–8.1)

## 2015-12-08 LAB — PROTIME-INR
INR: 1.19 (ref 0.00–1.49)
Prothrombin Time: 15.3 seconds — ABNORMAL HIGH (ref 11.6–15.2)

## 2015-12-08 LAB — POC OCCULT BLOOD, ED: Fecal Occult Bld: POSITIVE — AB

## 2015-12-08 LAB — PREALBUMIN: Prealbumin: 13.5 mg/dL — ABNORMAL LOW (ref 18–38)

## 2015-12-08 LAB — IRON AND TIBC
Iron: 12 ug/dL — ABNORMAL LOW (ref 45–182)
Saturation Ratios: 3 % — ABNORMAL LOW (ref 17.9–39.5)
TIBC: 435 ug/dL (ref 250–450)
UIBC: 423 ug/dL

## 2015-12-08 LAB — PSA: PSA: 0.71 ng/mL (ref 0.10–4.00)

## 2015-12-08 LAB — FOLATE: Folate: 10 ng/mL (ref >5.9)

## 2015-12-08 LAB — PREPARE RBC (CROSSMATCH)

## 2015-12-08 LAB — FERRITIN: Ferritin: 11 ng/mL — ABNORMAL LOW (ref 24–336)

## 2015-12-08 MED ORDER — SODIUM CHLORIDE 0.9% FLUSH
3.0000 mL | Freq: Two times a day (BID) | INTRAVENOUS | Status: DC
  Administered 2015-12-08 – 2015-12-09 (×3): 3 mL via INTRAVENOUS

## 2015-12-08 MED ORDER — AMLODIPINE BESYLATE 5 MG PO TABS: 5.0000 mg | ORAL_TABLET | Freq: Every day | ORAL | Status: DC

## 2015-12-08 MED ORDER — MAGNESIUM CITRATE PO SOLN: 1.0000 | Freq: Once | ORAL | Status: DC | PRN

## 2015-12-08 MED ORDER — TRAZODONE HCL 50 MG PO TABS: 25.0000 mg | ORAL_TABLET | Freq: Every evening | ORAL | Status: DC | PRN

## 2015-12-08 MED ORDER — SODIUM CHLORIDE 0.9 % IV SOLN
8.0000 mg/h | INTRAVENOUS | Status: DC
  Administered 2015-12-08 – 2015-12-09 (×2): 8 mg/h via INTRAVENOUS
  Filled 2015-12-08 (×5): qty 80

## 2015-12-08 MED ORDER — NICOTINE POLACRILEX 2 MG MT GUM
2.0000 mg | CHEWING_GUM | OROMUCOSAL | Status: DC | PRN
  Filled 2015-12-08: qty 1

## 2015-12-08 MED ORDER — IPRATROPIUM BROMIDE 0.02 % IN SOLN
2.5000 mL | Freq: Four times a day (QID) | RESPIRATORY_TRACT | Status: DC
  Administered 2015-12-08 – 2015-12-09 (×2): 0.5 mg via RESPIRATORY_TRACT
  Filled 2015-12-08 (×2): qty 2.5

## 2015-12-08 MED ORDER — BISACODYL 10 MG RE SUPP: 10.0000 mg | Freq: Every day | RECTAL | Status: DC | PRN

## 2015-12-08 MED ORDER — FUROSEMIDE 80 MG PO TABS: 80.0000 mg | ORAL_TABLET | Freq: Two times a day (BID) | ORAL | Status: DC

## 2015-12-08 MED ORDER — LEDIPASVIR-SOFOSBUVIR 90-400 MG PO TABS: 1.0000 | ORAL_TABLET | Freq: Every day | ORAL | Status: DC

## 2015-12-08 MED ORDER — LEVOTHYROXINE SODIUM 112 MCG PO TABS
112.0000 ug | ORAL_TABLET | Freq: Every day | ORAL | Status: DC
  Administered 2015-12-09: 112 ug via ORAL
  Filled 2015-12-08 (×2): qty 1

## 2015-12-08 MED ORDER — FUROSEMIDE 80 MG PO TABS
80.0000 mg | ORAL_TABLET | Freq: Two times a day (BID) | ORAL | Status: DC
  Administered 2015-12-08 – 2015-12-09 (×3): 80 mg via ORAL
  Filled 2015-12-08 (×3): qty 1

## 2015-12-08 MED ORDER — DIPHENHYDRAMINE HCL 25 MG PO CAPS
25.0000 mg | ORAL_CAPSULE | Freq: Once | ORAL | Status: AC
  Administered 2015-12-08: 25 mg via ORAL
  Filled 2015-12-08: qty 1

## 2015-12-08 MED ORDER — PANTOPRAZOLE SODIUM 40 MG IV SOLR: 40.0000 mg | Freq: Two times a day (BID) | INTRAVENOUS | Status: DC

## 2015-12-08 MED ORDER — LISINOPRIL 20 MG PO TABS: 20.0000 mg | ORAL_TABLET | Freq: Every day | ORAL | Status: DC

## 2015-12-08 MED ORDER — ONDANSETRON HCL 4 MG PO TABS: 4.0000 mg | ORAL_TABLET | Freq: Four times a day (QID) | ORAL | Status: DC | PRN

## 2015-12-08 MED ORDER — ACETAMINOPHEN 325 MG PO TABS
650.0000 mg | ORAL_TABLET | Freq: Four times a day (QID) | ORAL | Status: DC | PRN
  Administered 2015-12-09: 650 mg via ORAL
  Filled 2015-12-08: qty 2

## 2015-12-08 MED ORDER — METOPROLOL SUCCINATE ER 50 MG PO TB24: 50.0000 mg | ORAL_TABLET | Freq: Every day | ORAL | Status: DC

## 2015-12-08 MED ORDER — SENNOSIDES-DOCUSATE SODIUM 8.6-50 MG PO TABS: 1.0000 | ORAL_TABLET | Freq: Every evening | ORAL | Status: DC | PRN

## 2015-12-08 MED ORDER — ALBUTEROL SULFATE (2.5 MG/3ML) 0.083% IN NEBU: 2.5000 mg | INHALATION_SOLUTION | Freq: Four times a day (QID) | RESPIRATORY_TRACT | Status: DC | PRN

## 2015-12-08 MED ORDER — LISINOPRIL 20 MG PO TABS
20.0000 mg | ORAL_TABLET | Freq: Every day | ORAL | Status: DC
  Administered 2015-12-08 – 2015-12-09 (×2): 20 mg via ORAL
  Filled 2015-12-08 (×2): qty 1

## 2015-12-08 MED ORDER — ACETAMINOPHEN 325 MG PO TABS
650.0000 mg | ORAL_TABLET | Freq: Once | ORAL | Status: AC
  Administered 2015-12-08: 650 mg via ORAL
  Filled 2015-12-08: qty 2

## 2015-12-08 MED ORDER — AMLODIPINE BESYLATE 5 MG PO TABS
5.0000 mg | ORAL_TABLET | Freq: Every day | ORAL | Status: DC
  Administered 2015-12-08 – 2015-12-09 (×2): 5 mg via ORAL
  Filled 2015-12-08 (×2): qty 1

## 2015-12-08 MED ORDER — SODIUM CHLORIDE 0.9 % IV SOLN
Freq: Once | INTRAVENOUS | Status: AC
  Administered 2015-12-08: 13:00:00 via INTRAVENOUS

## 2015-12-08 MED ORDER — ALBUTEROL SULFATE HFA 108 (90 BASE) MCG/ACT IN AERS: 2.0000 | INHALATION_SPRAY | Freq: Four times a day (QID) | RESPIRATORY_TRACT | Status: DC | PRN

## 2015-12-08 MED ORDER — IPRATROPIUM BROMIDE 0.02 % IN SOLN: 2.5000 mL | Freq: Four times a day (QID) | RESPIRATORY_TRACT | Status: DC

## 2015-12-08 MED ORDER — ACETAMINOPHEN 650 MG RE SUPP: 650.0000 mg | Freq: Four times a day (QID) | RECTAL | Status: DC | PRN

## 2015-12-08 MED ORDER — INSULIN ASPART 100 UNIT/ML ~~LOC~~ SOLN
0.0000 [IU] | Freq: Three times a day (TID) | SUBCUTANEOUS | Status: DC
  Administered 2015-12-08: 1 [IU] via SUBCUTANEOUS

## 2015-12-08 MED ORDER — ONDANSETRON HCL 4 MG/2ML IJ SOLN: 4.0000 mg | Freq: Four times a day (QID) | INTRAMUSCULAR | Status: DC | PRN

## 2015-12-08 MED ORDER — SODIUM CHLORIDE 0.9 % IV SOLN
80.0000 mg | Freq: Once | INTRAVENOUS | Status: AC
  Administered 2015-12-08: 80 mg via INTRAVENOUS
  Filled 2015-12-08: qty 80

## 2015-12-08 MED ORDER — METOPROLOL SUCCINATE ER 50 MG PO TB24
50.0000 mg | ORAL_TABLET | Freq: Every day | ORAL | Status: DC
  Administered 2015-12-08 – 2015-12-09 (×2): 50 mg via ORAL
  Filled 2015-12-08 (×2): qty 1

## 2015-12-08 NOTE — H&P (Signed)
History and Physical    Brad [name] ZOX:[id number] DOB: [birth year] DOA: 12/08/2015  Referring MD/NP/PA: EDP PCP: [name], MD  Outpatient Specialists: Patient Care Team:   Patient coming from:  Home   Chief Complaint: Shortness of Breath  HPI: Brad [name] is a 60 y.o. male with extensive medical history including  Chronic Hep C, Iron deficiency anemia with history of GIB in 2016, Grade 2 DCHF, HTN, HLD, hypothyroidism, History of CVA in 2016 with TIA s/p carotid endarterectomy on April 2017,presented to the ED as instructed by his PCP after being found to have severe symptomatic anemia.  In review, he presented to his PCP on 5/2 with complaints of 2 weeks history of ongoing fatigue, hypersomnolence, increasing shortness of breath, palpitations, and lower extremity swelling. During that visit, his diuretics were increased without significant improvement of his symptoms. Labs were drawn, revealing a Hb 6.6. As of 4/25, his Hb was 8.2 felt to be due to right groin hematoma after his endarterectomy. On admission today, his Hb was 6.3, MCV 74.8 and Hemoccult +.  Denies fevers, chills, night sweats, vision changes, or mucositis. Denies any chest pain or productive cough. Denies nausea, heartburn or change in bowel habits. He denies any  Hematochezia or melena. Denies abdominal pain. Appetite is normal. Denies any dysuria. Denies abnormal skin rashes, or neuropathy. He has a healing right groin hematoma. Denies any other bleeding issues such as epistaxis, or hematemesis. He takes ASA and Plavix. He reports ice chips craving, significant caffeine intake. He heavily smokes. No ETOH or IVD. No recent trips or sick contacts.   ED Course:  BP 182/72 mmHg  Pulse 63  Temp(Src) 97.5 F (36.4 C)  Resp 21  SpO2 100% Hb 6.6, EKG SR with QTC 465. BNP 759.1. without significant changes from prior, CXR without acute changes or edema. He is to receive 2 units of blood. GI to consult   Review of  Systems: As per HPI otherwise 10 point review of systems negative.   Past Medical History  Diagnosis Date  . Depression   . GERD (gastroesophageal reflux disease)   . Hyperlipidemia 08/27/2014    takes Simvastatin daily  . Hypertension     takes Amlodipine and Lisinopril daily  . Hypothyroidism 08/27/2014    takes Synthroid daily  . Anemia     takes Ferrous Sulfated daily  . Carotid stenosis 08/27/2014    takes Aspirin daily  . Diabetes mellitus type II     takes Metformin and Glipizide daily  . Muscle spasm     takes Zanaflex daily as needed  . Insomnia     takes Pamelor nightly  . COPD (chronic obstructive pulmonary disease) (HCC)     Albuterol daily as needed and Spiriva daily  . MVP (mitral valve prolapse)   . History of blood clots     in right leg;being followed by Dr.Brabham for this  . Shortness of breath dyspnea   . Peripheral neuropathy ([place])   . Headache     occasionally  . Dizziness   . Stroke ([place])   . Arthritis   . Joint pain     hips  . Peripheral edema     takes Furosemide daily as needed  . History of hiatal hernia   . History of gastric ulcer   . Urinary frequency   . Urinary urgency   . History of blood transfusion 09/2014    2 units-no abnormal reaction noted  . HTN (hypertension) 09/14/2015  .  Chronic hepatitis C ([place]) 09/17/2015  . Prurigo nodularis     Past Surgical History  Procedure Laterality Date  . Neg stress test  2000  . Rotator cuff repair Right 2009    multiple f/u Sxs/infection Tx  . Incision and drainage perirectal abscess  03/2009  . Esophagogastroduodenoscopy N/A 09/24/2014    Procedure: ESOPHAGOGASTRODUODENOSCOPY (EGD);  Surgeon: Winfield Cunas., MD;  Location: [facility] END[facility]COPY;  Service: Endoscopy;  Laterality: N/A;  . I&d of right arm    . Endarterectomy Left 11/13/2014    Procedure: ENDARTERECTOMY CAROTID WITH PATCH ANGIOPLASTY;  Surgeon: Serafina Mitchell, MD;  Location: [employer];  Service: Vascular;  Laterality: Left;  .  Peripheral vascular catheterization N/A 11/30/2015    Procedure: Carotid PTA/Stent Intervention;  Surgeon: Serafina Mitchell, MD;  Location: [place] [facility];  Service: Cardiovascular;  Laterality: N/A;     reports that he has been smoking Cigarettes.  He has a 48 pack-year smoking history. He has never used smokeless tobacco. He reports that he drinks about 0.6 oz of alcohol per week. He reports that he does not use illicit drugs.  Allergies  Allergen Reactions  . Doxycycline Hives and Itching  . Lipitor [Atorvastatin] Other (See Comments)    Myalgia   . Oxycodone Itching    Family History  Problem Relation Age of Onset  . Alcohol abuse Father   . Lung cancer Father   . Heart disease Mother     automated implantable cardioverter-defibrillator   . Diabetes type II Mother   . Lung cancer Maternal Grandmother   . Lung cancer Sister     Family history reviewed and not pertinent (If you reviewed it)  Prior to Admission medications   Medication Sig Start Date End Date Taking? Authorizing Provider  amLODipine (NORVASC) 5 MG tablet Take 1 tablet (5 mg total) by mouth daily. 10/14/15 10/13/16  Biagio Borg, MD  aspirin EC 325 MG tablet Take 1 tablet (325 mg total) by mouth daily. 09/25/14   Belkys A Regalado, MD  Blood Pressure Monitor DEVI Use to check Blood pressure daily 05/25/14   Biagio Borg, MD  clopidogrel (PLAVIX) 75 MG tablet Take 1 tablet (75 mg total) by mouth daily. 11/11/15   Rosalin Hawking, MD  ferrous sulfate 325 (65 FE) MG tablet Take 1 tablet (325 mg total) by mouth 3 (three) times daily with meals. 09/25/14   Belkys A Regalado, MD  furosemide (LASIX) 80 MG tablet Take 1 tablet (80 mg total) by mouth 2 (two) times daily. 12/07/15   Biagio Borg, MD  glucose blood (ACCU-CHEK AVIVA PLUS) test strip Use to check blood sugars daily Dx E11.9 11/09/15   Biagio Borg, MD  ipratropium (ATROVENT HFA) 17 MCG/ACT inhaler Inhale 2 puffs into the lungs 4 (four) times daily. 10/14/15   Biagio Borg, MD   Lancets MISC 1 each by Other route See admin instructions. Reported on 09/07/2015    Historical Provider, MD  Ledipasvir-Sofosbuvir (HARVONI) 90-400 MG TABS Take 1 tablet by mouth daily. 11/24/15   Thayer Headings, MD  levothyroxine (SYNTHROID, LEVOTHROID) 112 MCG tablet Take 1 tablet (112 mcg total) by mouth daily. 06/09/15   Biagio Borg, MD  lisinopril (PRINIVIL,ZESTRIL) 20 MG tablet Take 1 tablet (20 mg total) by mouth daily. 09/21/15   Biagio Borg, MD  metFORMIN (GLUCOPHAGE-XR) 500 MG 24 hr tablet Take 2 tablets (1,000 mg total) by mouth daily with breakfast. 12/02/15   Janalyn Harder  Trinh, PA-C  metoprolol succinate (TOPROL-XL) 50 MG 24 hr tablet Take 1 tablet (50 mg total) by mouth daily. Take with or immediately following a meal. 10/21/15   Jerline Pain, MD  PROAIR HFA 108 [phone] BASE) MCG/ACT inhaler INHALE 2 PUFFS BY MOUTH EVERY 6 HOURS AS NEEDED FOR WHEEZING OR SHORTNESS OF BREATH 11/06/14   Biagio Borg, MD  Respiratory Therapy Supplies (NEBULIZER) DEVI Use as directed four times daily 09/14/15   Biagio Borg, MD  tiZANidine (ZANAFLEX) 4 MG tablet TAKE 1 TABLET BY MOUTH EVERY 6 HOURS AS NEEDED FOR MUSCLE SPASMS 11/09/15   Biagio Borg, MD  traMADol (ULTRAM) 50 MG tablet TAKE 1-2 TABLETS BY MOUTH UP TO FOUR TIMES DAILY 08/03/15   Biagio Borg, MD    Physical Exam:    Filed Vitals:   12/08/15 6294 12/08/15 0808 12/08/15 0901  BP: 193/63 166/63 182/72  Pulse: 68 64 63  Temp: 97.5 F (36.4 C)    Resp: '18 18 21  '$ SpO2: 100% 100% 100%      Constitutional: NAD, calm, comfortable Filed Vitals:   12/08/15 0718 12/08/15 0808 12/08/15 0901  BP: 193/63 166/63 182/72  Pulse: 68 64 63  Temp: 97.5 F (36.4 C)    Resp: '18 18 21  '$ SpO2: 100% 100% 100%   Eyes: PERRL, lids and conjunctivae normal ENMT: Mucous membranes are moist. Posterior pharynx clear of any exudate or lesions.Normal dentition.  Neck: normal, supple, no masses, no thyromegaly Respiratory: clear to auscultation bilaterally, minimal  wheezing, no crackles. Normal respiratory effort. No accessory muscle use.  Cardiovascular: Regular rate and rhythm, soft 1/6 murmurs / rubs / gallops. 2+ bilateral lower extremity edema. 2+ pedal pulses. No carotid bruits.  Abdomen: no tenderness, ventral hernia palpated. No hepatosplenomegaly. Bowel sounds positive.  Musculoskeletal: no clubbing / cyanosis. No joint deformity upper and lower extremities. Good ROM, no contractures. Normal muscle tone.  Skin: no rashes, lesions, ulcers. No induration. Healing right thigh bruise Neurologic: CN 2-12 grossly intact. Sensation intact, DTR normal. Strength 5/5 in all 4.  Psychiatric: Normal judgment and insight. Alert and oriented x 3. Normal mood.     Labs on Admission: I have personally reviewed following labs and imaging studies  CBC:  Recent Labs Lab 12/07/15 1522 12/08/15 0722  WBC 9.0 6.9  NEUTROABS 6.1  --   HGB 6.6 Repeated and verified X2.* 6.3*  HCT 21.1* 22.6*  MCV 69.1 Repeated and verified X2.* 74.8*  PLT 399.0 765    Basic Metabolic Panel:  Recent Labs Lab 12/07/15 1522 12/08/15 0722  NA 140 139  K 4.1 3.9  CL 103 105  CO2 30 25  GLUCOSE 103* 222*  BUN 14 10  CREATININE 1.08 1.08  CALCIUM 8.5 8.2*    GFR: Estimated Creatinine Clearance: 100.5 mL/min (by C-G formula based on Cr of 1.08).  Liver Function Tests:  Recent Labs Lab 12/07/15 1522  AST 19  ALT 10  ALKPHOS 146*  BILITOT 0.3  PROT 5.7*  ALBUMIN 2.5*   No results for input(s): LIPASE, AMYLASE in the last 168 hours. No results for input(s): AMMONIA in the last 168 hours.  Coagulation Profile: No results for input(s): INR, PROTIME in the last 168 hours.  Cardiac Enzymes: No results for input(s): CKTOTAL, CKMB, CKMBINDEX, TROPONINI in the last 168 hours.  BNP (last 3 results) No results for input(s): PROBNP in the last 8760 hours.  HbA1C:  Recent Labs  12/07/15 1522  HGBA1C 5.7  CBG: No results for input(s): GLUCAP in the  last 168 hours.  Lipid Profile:  Recent Labs  12/07/15 1522  CHOL 134  HDL 24.90*  LDLCALC 80  TRIG 146.0  CHOLHDL 5    Thyroid Function Tests:  Recent Labs  12/07/15 1522  TSH 4.70*    Anemia Panel: No results for input(s): VITAMINB12, FOLATE, FERRITIN, TIBC, IRON, RETICCTPCT in the last 72 hours.  Urine analysis:    Component Value Date/Time   COLORURINE YELLOW 06/09/2015 0842   APPEARANCEUR CLEAR 06/09/2015 0842   LABSPEC 1.025 06/09/2015 0842   PHURINE 6.0 06/09/2015 0842   GLUCOSEU 250* 06/09/2015 0842   GLUCOSEU 100* 11/13/2014 0619   HGBUR SMALL* 06/09/2015 0842   BILIRUBINUR NEGATIVE 06/09/2015 0842   KETONESUR NEGATIVE 06/09/2015 0842   PROTEINUR >300* 11/13/2014 0619   UROBILINOGEN 0.2 06/09/2015 0842   NITRITE NEGATIVE 06/09/2015 0842   LEUKOCYTESUR NEGATIVE 06/09/2015 0842    Sepsis Labs: '@LABRCNTIP'$ (procalcitonin:4,lacticidven:4) ) Recent Results (from the past 240 hour(s))  MRSA PCR Screening     Status: Abnormal   Collection Time: 11/30/15  3:03 PM  Result Value Ref Range Status   MRSA by PCR POSITIVE (A) NEGATIVE Final    Comment:        The GeneXpert MRSA Assay (FDA approved for NASAL specimens only), is one component of a comprehensive MRSA colonization surveillance program. It is not intended to diagnose MRSA infection nor to guide or monitor treatment for MRSA infections. RESULT CALLED TO, READ BACK BY AND VERIFIED WITH: Eulah Pont RN 16:45 11/30/15 ([email or web address])      Radiological Exams on Admission: Dg Chest 2 View  12/08/2015  CLINICAL DATA:  Shortness of breath and anemia EXAM: CHEST  2 VIEW COMPARISON:  November 21, 2014 FINDINGS: There is mild scarring in the left upper lobe and apex regions. There is no edema or consolidation. The heart size and pulmonary vascularity are normal. No adenopathy. There is degenerative change in the thoracic spine. IMPRESSION: Mild scarring left upper lobe/ apex regions. No edema or consolidation.  Electronically Signed   By: Lowella Grip III M.D.   On: 12/08/2015 07:34    EKG: Independently reviewed.  Assessment/Plan Active Problems:   COPD (chronic obstructive pulmonary disease) (HCC)   GERD   History of CVA (cerebrovascular accident) without residual deficits   Hypertensive heart disease   Hyperlipidemia   Hypothyroidism   Anemia   Atherosclerotic peripheral vascular disease with intermittent claudication (HCC)   Duodenal ulcer disease   Chronic hepatitis C (HCC)   S/P carotid endarterectomy   Hypersomnolence   GI bleed   Symptomatic Anemia likely of Gastro Intestinal Bleed in a patient with a history of Iron deficiency anemia, likely compounded by anemia of chronic disease, right  Thigh hematoma and pica . Pt h/o GIB in 09/2014 with endoscopy revealing duodenal ulcer.  Admission Hb 6.3 (13 in February then 8.2 in 11/2015)  MCV is 74. 8 Last Ferritin on 09/2015 was 8 ,GI consult is pending Admit to obs tele Will type and screen, Transfuse 2 units of bloodcheck serial CBCs, and transfuse for Hgb less than 7.  Protonix drip NPO until GI consult, Iron Studies prior to transfusion. If Ferritin continues to be low, then may need Iron IV while here Avoid ASA until GI consult.   Hypertension BP 182/72 mmHg  Pulse 63  Temp(Src) 97.5 F (36.4 C)  Resp 21  SpO2 100% Continue home anti-hypertensive medications.  Add Hydralazine Q6 hours as needed for SBP >  160 and /or DBP >110.  Hyperlipidemia Continue home statins   Hypothyroidism: Chronic. Last TSH on 5/2 4.70 -Continue home Synthroid  History of bilateral carotid stenosis S/p Bilat Carotid endarterectomy, L on 2016, R stenting (via groin) in 11/2015 On ASA and Plavix Will await for GI recommendations prior to further meds  Type II Diabetes Home regimen includes Current blood sugar level is 222 Lab Results  Component Value Date   HGBA1C 5.7 12/07/2015  Hold home oral diabetic medications.  SSI Heart healthy carb  modified diet.  Hep C + diagnosed in 09/2015,   HCV quant [id number], og 6.23 HIV NR On  therapy as OP with Ledipasvir-Sofosbuvir  on 5/9 with Dr. Linus Salmons ID    Acute on Chronic diastolic heart failure, last echocardiogram 09/2014 with normal LVF, EF 55-60%, and Gr 2 DD BNP 759.1 Repeat 2 Decho as patient is symptomatic and has increased leg swelling - Careful use of IVF  Continue diuresis  - Daily weights, strict I/O   COPD chronic, no acute changes. CXR NAD Continue his home inhalers - O2 prn - CBC in am Incentive spirometry  Protein calorie malnutrition. Alb 1.8  Check prealbumin Consider nutrition consult in view of Iron deficiency, pica, obesity  Tobacco abuse -  Nicotine gum -  Counseled cessation   DVT prophylaxis: SCDs Family Communication:  Discussed with patient  Disposition Plan: Expect patient to be discharged to home Consults called:   G I  Admission status: Obs Tele   Donalyn Schneeberger E, PA-C [facility]   If 7PM-7AM, please contact night-coverage [email or web address] Password [email or web address]  12/08/2015, 9:53 AM

## 2015-12-08 NOTE — Consult Note (Signed)
Subjective:   HPI  The patient is a 60 year old male with a history of hepatitis C, hypertension, hyperlipidemia, hypothyroidism, and a history of a CVA in the past. He was instructed to come to the hospital by his physician after labs were drawn revealing a hemoglobin of 6.6. The patient denies vomiting blood or passing any blood in his bowel movements. He was found to be heme positive.  He states that a couple of months ago he had stool for DNA check for colon cancer and it was negative. He states he did not want a colonoscopy and still does not want a colonoscopy.  A year ago he had an EGD and was found to have a duodenal ulcer. He denies abdominal pain.  Review of Systems No chest pain. He has been fatigued recently.  Past Medical History  Diagnosis Date  . Depression   . GERD (gastroesophageal reflux disease)   . Hyperlipidemia 08/27/2014    takes Simvastatin daily  . Hypertension     takes Amlodipine and Lisinopril daily  . Hypothyroidism 08/27/2014    takes Synthroid daily  . Anemia     takes Ferrous Sulfated daily  . Carotid stenosis 08/27/2014    takes Aspirin daily  . Diabetes mellitus type II     takes Metformin and Glipizide daily  . Muscle spasm     takes Zanaflex daily as needed  . Insomnia     takes Pamelor nightly  . COPD (chronic obstructive pulmonary disease) (HCC)     Albuterol daily as needed and Spiriva daily  . MVP (mitral valve prolapse)   . History of blood clots     in right leg;being followed by Dr.Brabham for this  . Shortness of breath dyspnea   . Peripheral neuropathy ([place])   . Headache     occasionally  . Dizziness   . Stroke ([place])   . Arthritis   . Joint pain     hips  . Peripheral edema     takes Furosemide daily as needed  . History of hiatal hernia   . History of gastric ulcer   . Urinary frequency   . Urinary urgency   . History of blood transfusion 09/2014    2 units-no abnormal reaction noted  . HTN (hypertension) 09/14/2015  .  Chronic hepatitis C ([place]) 09/17/2015  . Prurigo nodularis   . GI bleed 12/2015   Past Surgical History  Procedure Laterality Date  . Neg stress test  2000  . Rotator cuff repair Right 2009    multiple f/u Sxs/infection Tx  . Incision and drainage perirectal abscess  03/2009  . Esophagogastroduodenoscopy N/A 09/24/2014    Procedure: ESOPHAGOGASTRODUODENOSCOPY (EGD);  Surgeon: Winfield Cunas., MD;  Location: [facility] ENDOSCOPY;  Service: Endoscopy;  Laterality: N/A;  . I&d of right arm    . Endarterectomy Left 11/13/2014    Procedure: ENDARTERECTOMY CAROTID WITH PATCH ANGIOPLASTY;  Surgeon: Serafina Mitchell, MD;  Location: [place];  Service: Vascular;  Laterality: Left;  . Peripheral vascular catheterization N/A 11/30/2015    Procedure: Carotid PTA/Stent Intervention;  Surgeon: Serafina Mitchell, MD;  Location: [place] [facility];  Service: Cardiovascular;  Laterality: N/A;   Social History   Social History  . Marital Status: Single    Spouse Name: N/A  . Number of Children: 0  . Years of Education: N/A   Occupational History  . Disabled    Social History Main Topics  . Smoking status: Current Every Day Smoker --  1.00 packs/day for 48 years    Types: Cigarettes  . Smokeless tobacco: Never Used     Comment: 1-2 pks per day  . Alcohol Use: 0.6 oz/week    1 Cans of beer per week     Comment: rarely  . Drug Use: No  . Sexual Activity: Yes   Other Topics Concern  . Not on file   Social History Narrative   Divorced with fiance.   Disabled since June 2010 - right shoulder   No children.         family history includes Alcohol abuse in his father; Diabetes type II in his mother; Heart disease in his mother; Lung cancer in his father, maternal grandmother, and sister.  Current facility-administered medications:  .  acetaminophen (TYLENOL) tablet 650 mg, 650 mg, Oral, Q6H PRN **OR** acetaminophen (TYLENOL) suppository 650 mg, 650 mg, Rectal, Q6H PRN, Rondel Jumbo, PA-C .  albuterol  (PROVENTIL) (2.5 MG/3ML) 0.083% nebulizer solution 2.5 mg, 2.5 mg, Nebulization, Q6H PRN, Rondel Jumbo, PA-C .  amLODipine (NORVASC) tablet 5 mg, 5 mg, Oral, Daily, Rondel Jumbo, PA-C, 5 mg at 12/08/15 1204 .  bisacodyl (DULCOLAX) suppository 10 mg, 10 mg, Rectal, Daily PRN, Rondel Jumbo, PA-C .  furosemide (LASIX) tablet 80 mg, 80 mg, Oral, BID, Rondel Jumbo, PA-C, 80 mg at 12/08/15 1214 .  insulin aspart (novoLOG) injection 0-9 Units, 0-9 Units, Subcutaneous, TID WC, Rondel Jumbo, PA-C, 1 Units at 12/08/15 1205 .  ipratropium (ATROVENT) nebulizer solution 0.5 mg, 2.5 mL, Inhalation, QID, Rondel Jumbo, PA-C .  levothyroxine (SYNTHROID, LEVOTHROID) tablet 112 mcg, 112 mcg, Oral, QAC breakfast, Rondel Jumbo, PA-C, 112 mcg at 12/08/15 1206 .  lisinopril (PRINIVIL,ZESTRIL) tablet 20 mg, 20 mg, Oral, Daily, Rondel Jumbo, PA-C, 20 mg at 12/08/15 1202 .  magnesium citrate solution 1 Bottle, 1 Bottle, Oral, Once PRN, Rondel Jumbo, PA-C .  metoprolol succinate (TOPROL-XL) 24 hr tablet 50 mg, 50 mg, Oral, Daily, Rondel Jumbo, PA-C, 50 mg at 12/08/15 1203 .  nicotine polacrilex (NICORETTE) gum 2 mg, 2 mg, Oral, PRN, Rondel Jumbo, PA-C .  ondansetron (ZOFRAN) tablet 4 mg, 4 mg, Oral, Q6H PRN **OR** ondansetron (ZOFRAN) injection 4 mg, 4 mg, Intravenous, Q6H PRN, Rondel Jumbo, PA-C .  pantoprazole (PROTONIX) 80 mg in sodium chloride 0.9 % 100 mL IVPB, 80 mg, Intravenous, Once, Waldemar Dickens, MD, 80 mg at 12/08/15 1330 .  pantoprazole (PROTONIX) 80 mg in sodium chloride 0.9 % 250 mL (0.32 mg/mL) infusion, 8 mg/hr, Intravenous, Continuous, Waldemar Dickens, MD .  Derrill Memo ON 12/12/2015] pantoprazole (PROTONIX) injection 40 mg, 40 mg, Intravenous, Q12H, Waldemar Dickens, MD .  senna-docusate (Senokot-S) tablet 1 tablet, 1 tablet, Oral, QHS PRN, Rondel Jumbo, PA-C .  sodium chloride flush (NS) 0.9 % injection 3 mL, 3 mL, Intravenous, Q12H, Coralee Pesa Wertman, PA-C, 3 mL at 12/08/15 1204 .   traZODone (DESYREL) tablet 25 mg, 25 mg, Oral, QHS PRN, Rondel Jumbo, PA-C Allergies  Allergen Reactions  . Doxycycline Hives and Itching  . Lipitor [Atorvastatin] Other (See Comments)    Myalgia   . Oxycodone Itching     Objective:     BP 174/70 mmHg  Pulse 68  Temp(Src) 97.9 F (36.6 C) (Oral)  Resp 19  Ht '5\' 11"'$  (1.803 m)  Wt 123.152 kg (271 lb 8 oz)  BMI 37.88 kg/m2  SpO2 100%  No acute distress  Nonicteric  Heart regular rhythm no murmurs  Lungs clear  Abdomen: Bowel sounds normal, soft, nontender, no masses or obvious hepatosplenomegaly  Laboratory No components found for: D1    Assessment:     Anemia  Heme-positive stool        Plan:     The patient is going to receive blood transfusion. I spoke to him about EGD and colonoscopy. He does not wish to have either. He says to just give him some blood, treat him, and sent him home. He does not want endoscopic evaluation. Lab Results  Component Value Date   HGB 6.2* 12/08/2015   HGB 6.3* 12/08/2015   HGB 6.6 Repeated and verified X2.* 12/07/2015   HCT 22.5* 12/08/2015   HCT 22.6* 12/08/2015   HCT 21.1* 12/07/2015   ALKPHOS 131* 12/08/2015   ALKPHOS 146* 12/07/2015   ALKPHOS 131* 09/27/2015   AST 20 12/08/2015   AST 19 12/07/2015   AST 43* 09/27/2015   ALT 13* 12/08/2015   ALT 10 12/07/2015   ALT 30 09/27/2015

## 2015-12-08 NOTE — ED Notes (Signed)
Pt here for SOB. sts his doctor sent him here for low hgb. sts SOB, leg swelling.

## 2015-12-08 NOTE — ED Notes (Signed)
Attempted report 

## 2015-12-08 NOTE — ED Provider Notes (Signed)
CSN: [id number]     Arrival date & time 12/08/15  0711 History   First MD Initiated Contact with Patient 12/08/15 [phone]     Chief Complaint  Patient presents with  . Shortness of Breath      Patient is a 60 y.o. male presenting with shortness of breath. The history is provided by the patient.  Shortness of Breath Associated symptoms: no abdominal pain, no chest pain and no rash    patient presents with shortness of breath and anemia. Seen by primary care doctor yesterday and told to follow-up with the ER because he had hemoglobin of 6.6. States he's had more shortness of breath over the last couple weeks. No blood in stool or black stool. He did have a recent carotid surgery intravascularly by vascular surgery. That was around a week ago. Slight bruising on his leg but otherwise no abdominal pain or blood. He has had some more swelling in his legs. Previous history of GI bleeds and has had ulcers in the past. He does have chronic hepatitis C.  Past Medical History  Diagnosis Date  . Depression   . GERD (gastroesophageal reflux disease)   . Hyperlipidemia 08/27/2014    takes Simvastatin daily  . Hypertension     takes Amlodipine and Lisinopril daily  . Hypothyroidism 08/27/2014    takes Synthroid daily  . Anemia     takes Ferrous Sulfated daily  . Carotid stenosis 08/27/2014    takes Aspirin daily  . Diabetes mellitus type II     takes Metformin and Glipizide daily  . Muscle spasm     takes Zanaflex daily as needed  . Insomnia     takes Pamelor nightly  . COPD (chronic obstructive pulmonary disease) (HCC)     Albuterol daily as needed and Spiriva daily  . MVP (mitral valve prolapse)   . History of blood clots     in right leg;being followed by Dr.Brabham for this  . Shortness of breath dyspnea   . Peripheral neuropathy ([place])   . Headache     occasionally  . Dizziness   . Stroke ([place])   . Arthritis   . Joint pain     hips  . Peripheral edema     takes Furosemide daily as  needed  . History of hiatal hernia   . History of gastric ulcer   . Urinary frequency   . Urinary urgency   . History of blood transfusion 09/2014    2 units-no abnormal reaction noted  . HTN (hypertension) 09/14/2015  . Chronic hepatitis C ([place]) 09/17/2015  . Prurigo nodularis   . GI bleed 12/2015   Past Surgical History  Procedure Laterality Date  . Neg stress test  2000  . Rotator cuff repair Right 2009    multiple f/u Sxs/infection Tx  . Incision and drainage perirectal abscess  03/2009  . Esophagogastroduodenoscopy N/A 09/24/2014    Procedure: ESOPHAGOGASTRODUODENOSCOPY (EGD);  Surgeon: Winfield Cunas., MD;  Location: [facility] ENDOSCOPY;  Service: Endoscopy;  Laterality: N/A;  . I&d of right arm    . Endarterectomy Left 11/13/2014    Procedure: ENDARTERECTOMY CAROTID WITH PATCH ANGIOPLASTY;  Surgeon: Serafina Mitchell, MD;  Location: [place];  Service: Vascular;  Laterality: Left;  . Peripheral vascular catheterization N/A 11/30/2015    Procedure: Carotid PTA/Stent Intervention;  Surgeon: Serafina Mitchell, MD;  Location: [place] [facility];  Service: Cardiovascular;  Laterality: N/A;   Family History  Problem Relation Age of  Onset  . Alcohol abuse Father   . Lung cancer Father   . Heart disease Mother     automated implantable cardioverter-defibrillator   . Diabetes type II Mother   . Lung cancer Maternal Grandmother   . Lung cancer Sister    Social History  Substance Use Topics  . Smoking status: Current Every Day Smoker -- 1.00 packs/day for 48 years    Types: Cigarettes  . Smokeless tobacco: Never Used     Comment: 1-2 pks per day  . Alcohol Use: 0.6 oz/week    1 Cans of beer per week     Comment: rarely    Review of Systems  Constitutional: Negative for appetite change.  HENT: Negative for facial swelling.   Respiratory: Positive for shortness of breath.   Cardiovascular: Positive for leg swelling. Negative for chest pain.  Gastrointestinal: Negative for abdominal pain  and blood in stool.  Musculoskeletal: Negative for back pain.  Skin: Negative for rash.  Hematological: Bruises/bleeds easily.      Allergies  Doxycycline; Lipitor; and Oxycodone  Home Medications   Prior to Admission medications   Medication Sig Start Date End Date Taking? Authorizing Provider  amLODipine (NORVASC) 5 MG tablet Take 1 tablet (5 mg total) by mouth daily. 10/14/15 10/13/16 Yes Biagio Borg, MD  aspirin EC 325 MG tablet Take 1 tablet (325 mg total) by mouth daily. 09/25/14  Yes Belkys A Regalado, MD  Blood Pressure Monitor DEVI Use to check Blood pressure daily 05/25/14  Yes Biagio Borg, MD  clopidogrel (PLAVIX) 75 MG tablet Take 1 tablet (75 mg total) by mouth daily. 11/11/15  Yes Rosalin Hawking, MD  ferrous sulfate 325 (65 FE) MG tablet Take 1 tablet (325 mg total) by mouth 3 (three) times daily with meals. 09/25/14  Yes Belkys A Regalado, MD  furosemide (LASIX) 80 MG tablet Take 1 tablet (80 mg total) by mouth 2 (two) times daily. 12/07/15  Yes Biagio Borg, MD  glucose blood (ACCU-CHEK AVIVA PLUS) test strip Use to check blood sugars daily Dx E11.9 11/09/15  Yes Biagio Borg, MD  ipratropium (ATROVENT HFA) 17 MCG/ACT inhaler Inhale 2 puffs into the lungs 4 (four) times daily. 10/14/15  Yes Biagio Borg, MD  Lancets MISC 1 each by Other route See admin instructions. Reported on 09/07/2015   Yes Historical Provider, MD  Ledipasvir-Sofosbuvir (HARVONI) 90-400 MG TABS Take 1 tablet by mouth daily. 11/24/15  Yes Thayer Headings, MD  levothyroxine (SYNTHROID, LEVOTHROID) 112 MCG tablet Take 1 tablet (112 mcg total) by mouth daily. 06/09/15  Yes Biagio Borg, MD  lisinopril (PRINIVIL,ZESTRIL) 20 MG tablet Take 1 tablet (20 mg total) by mouth daily. 09/21/15  Yes Biagio Borg, MD  metFORMIN (GLUCOPHAGE-XR) 500 MG 24 hr tablet Take 2 tablets (1,000 mg total) by mouth daily with breakfast. 12/02/15  Yes Alvia Grove, PA-C  metoprolol succinate (TOPROL-XL) 50 MG 24 hr tablet Take 1 tablet (50 mg  total) by mouth daily. Take with or immediately following a meal. 10/21/15  Yes Jerline Pain, MD  PROAIR HFA 108 (90 BASE) MCG/ACT inhaler INHALE 2 PUFFS BY MOUTH EVERY 6 HOURS AS NEEDED FOR WHEEZING OR SHORTNESS OF BREATH 11/06/14  Yes Biagio Borg, MD  Respiratory Therapy Supplies (NEBULIZER) DEVI Use as directed four times daily 09/14/15  Yes Biagio Borg, MD  tiZANidine (ZANAFLEX) 4 MG tablet TAKE 1 TABLET BY MOUTH EVERY 6 HOURS AS NEEDED FOR MUSCLE SPASMS 11/09/15  Yes Biagio Borg, MD  traMADol (ULTRAM) 50 MG tablet TAKE 1-2 TABLETS BY MOUTH UP TO FOUR TIMES DAILY 08/03/15  Yes Biagio Borg, MD   BP 174/70 mmHg  Pulse 68  Temp(Src) 97.9 F (36.6 C) (Oral)  Resp 19  Ht '5\' 11"'$  (1.803 m)  Wt 271 lb 8 oz (123.152 kg)  BMI 37.88 kg/m2  SpO2 100% Physical Exam  Constitutional: He appears well-developed.  HENT:  Head: Atraumatic.  Cardiovascular: Normal rate.   Pulmonary/Chest: Effort normal.  Abdominal: There is no tenderness.  Genitourinary: Guaiac positive stool.  Brown stool in vault.  Musculoskeletal: He exhibits edema.  Edema to bilateral lower extremities. States it has increased and is now to his knees.  Neurological: He is alert.  Skin: There is pallor.  Psychiatric: He has a normal mood and affect.    ED Course  Procedures (including critical care time) Labs Review Labs Reviewed  BASIC METABOLIC PANEL - Abnormal; Notable for the following:    Glucose, Bld 222 (*)    Calcium 8.2 (*)    All other components within normal limits  CBC - Abnormal; Notable for the following:    RBC 3.02 (*)    Hemoglobin 6.3 (*)    HCT 22.6 (*)    MCV 74.8 (*)    MCH 20.9 (*)    MCHC 27.9 (*)    RDW 16.6 (*)    All other components within normal limits  BRAIN NATRIURETIC PEPTIDE - Abnormal; Notable for the following:    B Natriuretic Peptide 759.1 (*)    All other components within normal limits  PROTIME-INR - Abnormal; Notable for the following:    Prothrombin Time 15.3 (*)    All  other components within normal limits  HEPATIC FUNCTION PANEL - Abnormal; Notable for the following:    Total Protein 5.8 (*)    Albumin 1.8 (*)    ALT 13 (*)    Alkaline Phosphatase 131 (*)    Bilirubin, Direct <0.1 (*)    All other components within normal limits  CBC - Abnormal; Notable for the following:    RBC 3.03 (*)    Hemoglobin 6.2 (*)    HCT 22.5 (*)    MCV 74.3 (*)    MCH 20.5 (*)    MCHC 27.6 (*)    RDW 16.5 (*)    All other components within normal limits  RETICULOCYTES - Abnormal; Notable for the following:    Retic Ct Pct 4.3 (*)    RBC. 3.03 (*)    All other components within normal limits  IRON AND TIBC - Abnormal; Notable for the following:    Iron 12 (*)    Saturation Ratios 3 (*)    All other components within normal limits  FERRITIN - Abnormal; Notable for the following:    Ferritin 11 (*)    All other components within normal limits  GLUCOSE, CAPILLARY - Abnormal; Notable for the following:    Glucose-Capillary 121 (*)    All other components within normal limits  PREALBUMIN - Abnormal; Notable for the following:    Prealbumin 13.5 (*)    All other components within normal limits  POC OCCULT BLOOD, ED - Abnormal; Notable for the following:    Fecal Occult Bld POSITIVE (*)    All other components within normal limits  VITAMIN B12  FOLATE  I-STAT TROPOININ, ED  TYPE AND SCREEN  PREPARE RBC (CROSSMATCH)    Imaging Review Dg Chest 2 View  12/08/2015  CLINICAL DATA:  Shortness of breath and anemia EXAM: CHEST  2 VIEW COMPARISON:  November 21, 2014 FINDINGS: There is mild scarring in the left upper lobe and apex regions. There is no edema or consolidation. The heart size and pulmonary vascularity are normal. No adenopathy. There is degenerative change in the thoracic spine. IMPRESSION: Mild scarring left upper lobe/ apex regions. No edema or consolidation. Electronically Signed   By: Lowella Grip III M.D.   On: 12/08/2015 07:34   I have personally  reviewed and evaluated these images and lab results as part of my medical decision-making.   EKG Interpretation   Date/Time:  Wednesday Dec 08 2015 07:16:48 EDT Ventricular Rate:  69 PR Interval:  170 QRS Duration: 118 QT Interval:  434 QTC Calculation: 465 R Axis:   -3 Text Interpretation:  Normal sinus rhythm Incomplete right bundle branch  block Cannot rule out Anteroseptal infarct , age undetermined Abnormal ECG  No significant change since last tracing Confirmed by Alvino Chapel  MD,  Ovid Curd [phone]) on 12/08/2015 7:28:48 AM Also confirmed by Alvino Chapel  MD,  Lashe Oliveira [phone]), editor Gilford Rile, CCT, [place] ([id number])  on 12/08/2015 8:16:18 AM      MDM   Final diagnoses:  Anemia, unspecified anemia type  Gastrointestinal hemorrhage, unspecified gastritis, unspecified gastrointestinal hemorrhage type    Patient with anemia. Hemoglobin 6.6. Guaiac positive. Did have recent catheterization and stenting of his neck, however I think that there is no evidence of bleeding at the site and with the GI bleed it is more likely the cause. Will admit to internal medicine and GI has been counseled.    Davonna Belling, MD 12/08/15 [phone]

## 2015-12-08 NOTE — ED Notes (Signed)
PA at bedside.

## 2015-12-09 ENCOUNTER — Other Ambulatory Visit (HOSPITAL_COMMUNITY): Payer: Medicare Other

## 2015-12-09 ENCOUNTER — Other Ambulatory Visit: Payer: Self-pay | Admitting: Internal Medicine

## 2015-12-09 DIAGNOSIS — K922 Gastrointestinal hemorrhage, unspecified: Secondary | ICD-10-CM | POA: Diagnosis not present

## 2015-12-09 DIAGNOSIS — D649 Anemia, unspecified: Secondary | ICD-10-CM | POA: Diagnosis not present

## 2015-12-09 LAB — COMPREHENSIVE METABOLIC PANEL
ALT: 12 U/L — ABNORMAL LOW (ref 17–63)
AST: 19 U/L (ref 15–41)
Albumin: 1.6 g/dL — ABNORMAL LOW (ref 3.5–5.0)
Alkaline Phosphatase: 115 U/L (ref 38–126)
Anion gap: 11 (ref 5–15)
BUN: 8 mg/dL (ref 6–20)
CO2: 28 mmol/L (ref 22–32)
Calcium: 8.1 mg/dL — ABNORMAL LOW (ref 8.9–10.3)
Chloride: 104 mmol/L (ref 101–111)
Creatinine, Ser: 1 mg/dL (ref 0.61–1.24)
GFR calc Af Amer: >60 mL/min (ref >60)
GFR calc non Af Amer: >60 mL/min (ref >60)
Glucose, Bld: 107 mg/dL — ABNORMAL HIGH (ref 65–99)
Potassium: 3.3 mmol/L — ABNORMAL LOW (ref 3.5–5.1)
Sodium: 143 mmol/L (ref 135–145)
Total Bilirubin: 0.6 mg/dL (ref 0.3–1.2)
Total Protein: 5.4 g/dL — ABNORMAL LOW (ref 6.5–8.1)

## 2015-12-09 LAB — TYPE AND SCREEN
ABO/RH(D): B POS
Antibody Screen: NEGATIVE
Unit division: 0
Unit division: 0

## 2015-12-09 LAB — CBC
HCT: 25.8 % — ABNORMAL LOW (ref 39.0–52.0)
Hemoglobin: 7.8 g/dL — ABNORMAL LOW (ref 13.0–17.0)
MCH: 23.1 pg — ABNORMAL LOW (ref 26.0–34.0)
MCHC: 30.2 g/dL (ref 30.0–36.0)
MCV: 76.6 fL — ABNORMAL LOW (ref 78.0–100.0)
Platelets: 243 10*3/uL (ref 150–400)
RBC: 3.37 MIL/uL — ABNORMAL LOW (ref 4.22–5.81)
RDW: 16.2 % — ABNORMAL HIGH (ref 11.5–15.5)
WBC: 6.2 10*3/uL (ref 4.0–10.5)

## 2015-12-09 LAB — GLUCOSE, CAPILLARY
Glucose-Capillary: 119 mg/dL — ABNORMAL HIGH (ref 65–99)
Glucose-Capillary: 92 mg/dL (ref 65–99)

## 2015-12-09 MED ORDER — IPRATROPIUM BROMIDE 0.02 % IN SOLN: 2.5000 mL | RESPIRATORY_TRACT | Status: DC | PRN

## 2015-12-09 MED ORDER — PANTOPRAZOLE SODIUM 40 MG PO TBEC: 40.0000 mg | DELAYED_RELEASE_TABLET | Freq: Two times a day (BID) | ORAL | Status: DC

## 2015-12-09 NOTE — Discharge Summary (Signed)
Discharge Summary  Brad [name] LSL:[id number] DOB: [birth year]  PCP: [name], MD  Admit date: 12/08/2015 Discharge date: 12/09/2015  Time spent: 35 minutes   Recommendations for Outpatient Follow-up:  1. PCP in 2 days to check symptoms and CBC.   Discharge Diagnoses:  Active Hospital Problems   Diagnosis Date Noted  . GI bleed 12/08/2015  . Symptomatic anemia 12/08/2015  . Bleeding gastrointestinal   . Hypersomnolence 12/07/2015  . S/P carotid endarterectomy 11/12/2015  . Chronic hepatitis C ([place]) 09/17/2015  . Atherosclerotic peripheral vascular disease with intermittent claudication ([place])   . Duodenal ulcer disease   . Anemia   . Hyperlipidemia 08/27/2014  . Hypothyroidism 08/27/2014  . Hypertensive heart disease   . History of CVA (cerebrovascular accident) without residual deficits   . COPD (chronic obstructive pulmonary disease) ([place]) 03/17/2010  . GERD 03/17/2010    Resolved Hospital Problems   Diagnosis Date Noted Date Resolved  No resolved problems to display.    Discharge Condition: Stable   Diet recommendation: Diabetic   Filed Vitals:   12/08/15 2128 12/09/15 0542  BP: 144/75 161/60  Pulse: 58 69  Temp: 98.2 F (36.8 C) 98.5 F (36.9 C)  Resp: 16 18    History of present illness:  Brad [name] is a 60 y.o. male with extensive medical history including Chronic Hep C, Iron deficiency anemia with history of GIB in 2016, Grade 2 DCHF, HTN, HLD, hypothyroidism, History of CVA in 2016 with TIA s/p carotid endarterectomy on April 2017,presented to the ED as instructed by his PCP after being found to have severe symptomatic anemia.   Hospital Course:  Principal Problem:   GI bleed Active Problems:   COPD (chronic obstructive pulmonary disease) (HCC)   GERD   History of CVA (cerebrovascular accident) without residual deficits   Hypertensive heart disease   Hyperlipidemia   Hypothyroidism   Anemia   Atherosclerotic peripheral vascular  disease with intermittent claudication (HCC)   Duodenal ulcer disease   Chronic hepatitis C (HCC)   S/P carotid endarterectomy   Hypersomnolence   Symptomatic anemia   Bleeding gastrointestinal  Pt was transfused 2 units of blood, with good response of his hemoglobin. He was seen by Dr. Penelope Coop of GI who recommended endoscopic evaluation give heme positive stool and history of peptic ulcer disease. The patient refused further GI evaluation at this time. He has been hemodynamically stable and denies any weakness, blood in stool, chest pain or dizziness. This morning, I had a long conversation with the patient explaining the rationale behind the need for further hospital monitoring of his blood counts and endoscopic evaluation. I told him that we do not know why he was so anemic, and that if he goes home he could die.  He understands this, repeating the risks back to me, and still says that he is leaving the hospital today. As the patient is competent and seems to understand the risks associated with his decision, I will discharge him from the hospital this morning. He will be on PO PPI BID, and will f/u with his PCP in 48 hours for repeat CBC.  Procedures:  2 unit pRBC transfusion 5/3   Consultations:  Dr. Penelope Coop GI   Discharge Exam: BP 161/60 mmHg  Pulse 69  Temp(Src) 98.5 F (36.9 C) (Oral)  Resp 18  Ht '5\' 11"'$  (1.803 m)  Wt 121.972 kg (268 lb 14.4 oz)  BMI 37.52 kg/m2  SpO2 97%  Eyes: PERRL, lids and conjunctivae normal  Respiratory: clear to auscultation bilaterally, minimal wheezing, no crackles. Normal respiratory effort. No accessory muscle use.  Cardiovascular: Regular rate and rhythm, soft 1/6 murmurs  2+ bilateral lower extremity edema. 2+ pedal pulses. No carotid bruits.  Abdomen: no tenderness, ventral hernia palpated. No hepatosplenomegaly. Bowel sounds positive.  Musculoskeletal: no clubbing / cyanosis. No joint deformity upper and lower extremities. Good ROM, no  contractures. Normal muscle tone.  Skin: no rashes, lesions, ulcers. No induration. Healing right thigh bruise Neurologic: CN 2-12 grossly intact. Sensation intact, DTR normal. Strength 5/5 in all 4.  Psychiatric: Normal judgment and insight. Alert and oriented x 3. Normal mood.   Discharge Instructions You were cared for by a hospitalist during your hospital stay. If you have any questions about your discharge medications or the care you received while you were in the hospital after you are discharged, you can call the unit and asked to speak with the hospitalist on call if the hospitalist that took care of you is not available. Once you are discharged, your primary care physician will handle any further medical issues. Please note that NO REFILLS for any discharge medications will be authorized once you are discharged, as it is imperative that you return to your primary care physician (or establish a relationship with a primary care physician if you do not have one) for your aftercare needs so that they can reassess your need for medications and monitor your lab values.     Medication List    STOP taking these medications        aspirin EC 325 MG tablet     clopidogrel 75 MG tablet  Commonly known as:  PLAVIX      TAKE these medications        amLODipine 5 MG tablet  Commonly known as:  NORVASC  Take 1 tablet (5 mg total) by mouth daily.     Blood Pressure Monitor Devi  Use to check Blood pressure daily     ferrous sulfate 325 (65 FE) MG tablet  Take 1 tablet (325 mg total) by mouth 3 (three) times daily with meals.     furosemide 80 MG tablet  Commonly known as:  LASIX  Take 1 tablet (80 mg total) by mouth 2 (two) times daily.     glucose blood test strip  Commonly known as:  ACCU-CHEK AVIVA PLUS  Use to check blood sugars daily Dx E11.9     ipratropium 17 MCG/ACT inhaler  Commonly known as:  ATROVENT HFA  Inhale 2 puffs into the lungs 4 (four) times daily.      Lancets Misc  1 each by Other route See admin instructions. Reported on 09/07/2015     Ledipasvir-Sofosbuvir 90-400 MG Tabs  Commonly known as:  HARVONI  Take 1 tablet by mouth daily.     levothyroxine 112 MCG tablet  Commonly known as:  SYNTHROID, LEVOTHROID  Take 1 tablet (112 mcg total) by mouth daily.     lisinopril 20 MG tablet  Commonly known as:  PRINIVIL,ZESTRIL  Take 1 tablet (20 mg total) by mouth daily.     metFORMIN 500 MG 24 hr tablet  Commonly known as:  GLUCOPHAGE-XR  Take 2 tablets (1,000 mg total) by mouth daily with breakfast.     metoprolol succinate 50 MG 24 hr tablet  Commonly known as:  TOPROL-XL  Take 1 tablet (50 mg total) by mouth daily. Take with or immediately following a meal.     Nebulizer Devi  Use  as directed four times daily     pantoprazole 40 MG tablet  Commonly known as:  PROTONIX  Take 1 tablet (40 mg total) by mouth 2 (two) times daily.     PROAIR HFA 108 (90 Base) MCG/ACT inhaler  Generic drug:  albuterol  INHALE 2 PUFFS BY MOUTH EVERY 6 HOURS AS NEEDED FOR WHEEZING OR SHORTNESS OF BREATH     tiZANidine 4 MG tablet  Commonly known as:  ZANAFLEX  TAKE 1 TABLET BY MOUTH EVERY 6 HOURS AS NEEDED FOR MUSCLE SPASMS     traMADol 50 MG tablet  Commonly known as:  ULTRAM  TAKE 1-2 TABLETS BY MOUTH UP TO FOUR TIMES DAILY       Allergies  Allergen Reactions  . Doxycycline Hives and Itching  . Lipitor [Atorvastatin] Other (See Comments)    Myalgia   . Oxycodone Itching       Follow-up Information    Follow up with [name], MD In 2 days.   Specialties:  Internal Medicine, Radiology   Why:  Pt refused GI evaluation, as well as observation for another 24 hrs. Needs CBC.   Contact information:   [place] [id number] [phone]        The results of significant diagnostics from this hospitalization (including imaging, microbiology, ancillary and laboratory) are listed below for reference.     Significant Diagnostic Studies: Dg Chest 2 View  12/08/2015  CLINICAL DATA:  Shortness of breath and anemia EXAM: CHEST  2 VIEW COMPARISON:  November 21, 2014 FINDINGS: There is mild scarring in the left upper lobe and apex regions. There is no edema or consolidation. The heart size and pulmonary vascularity are normal. No adenopathy. There is degenerative change in the thoracic spine. IMPRESSION: Mild scarring left upper lobe/ apex regions. No edema or consolidation. Electronically Signed   By: Lowella Grip III M.D.   On: 12/08/2015 07:34    Microbiology: Recent Results (from the past 240 hour(s))  MRSA PCR Screening     Status: Abnormal   Collection Time: 11/30/15  3:03 PM  Result Value Ref Range Status   MRSA by PCR POSITIVE (A) NEGATIVE Final    Comment:        The GeneXpert MRSA Assay (FDA approved for NASAL specimens only), is one component of a comprehensive MRSA colonization surveillance program. It is not intended to diagnose MRSA infection nor to guide or monitor treatment for MRSA infections. RESULT CALLED TO, READ BACK BY AND VERIFIED WITH: BMervin Hack RN 16:45 11/30/15 ([email or web address])      Labs: Basic Metabolic Panel:  Recent Labs Lab 12/07/15 1522 12/08/15 0722 12/09/15 0353  NA 140 139 143  K 4.1 3.9 3.3*  CL 103 105 104  CO2 '30 25 28  '$ GLUCOSE 103* 222* 107*  BUN '14 10 8  '$ CREATININE 1.08 1.08 1.00  CALCIUM 8.5 8.2* 8.1*   Liver Function Tests:  Recent Labs Lab 12/07/15 1522 12/08/15 0823 12/09/15 0353  AST '19 20 19  '$ ALT 10 13* 12*  ALKPHOS 146* 131* 115  BILITOT 0.3 0.3 0.6  PROT 5.7* 5.8* 5.4*  ALBUMIN 2.5* 1.8* 1.6*   No results for input(s): LIPASE, AMYLASE in the last 168 hours. No results for input(s): AMMONIA in the last 168 hours. CBC:  Recent Labs Lab 12/07/15 1522 12/08/15 0722 12/08/15 1106 12/08/15 2030 12/09/15 0353  WBC 9.0 6.9 7.4  --  6.2  NEUTROABS 6.1  --   --   --   --  HGB 6.6 Repeated and verified X2.* 6.3* 6.2*  7.6* 7.8*  HCT 21.1* 22.6* 22.5* 25.1* 25.8*  MCV 69.1 Repeated and verified X2.* 74.8* 74.3*  --  76.6*  PLT 399.0 324 303  --  243   Cardiac Enzymes: No results for input(s): CKTOTAL, CKMB, CKMBINDEX, TROPONINI in the last 168 hours. BNP: BNP (last 3 results)  Recent Labs  12/08/15 0722  BNP 759.1*    ProBNP (last 3 results) No results for input(s): PROBNP in the last 8760 hours.  CBG:  Recent Labs Lab 12/08/15 1130 12/08/15 1653 12/08/15 2148 12/09/15 0643  GLUCAP 121* 100* 100* 119*       Signed:  Jana Swartzlander [employer]  [facility] 12/09/2015, 10:04 AM

## 2015-12-09 NOTE — Discharge Instructions (Signed)
You refused further GI evaluation of your GI bleeding, or continued observation. Seek medical care (contact PCP, come to ER, or call 911) in case of chest pain, recurrent weakness, dark stools, or blood in your stool or vomitus.

## 2015-12-09 NOTE — Evaluation (Signed)
  Occupational Therapy Evaluation Patient Details Name: Brad [name] MRN: [id number] DOB: 05/28/1956 Today's Date: 12/09/2015    History of Present Illness Pt admitted with low hemoglobin. Pt with GI bleed   Clinical Impression   Pt overall at baseline.     Follow Up Recommendations  No OT follow up    Equipment Recommendations  None recommended by OT    Recommendations for Other Services       Precautions / Restrictions Precautions Precautions: None      Mobility Bed Mobility Overal bed mobility: Modified Independent                Transfers Overall transfer level: Modified independent                         ADL Overall ADL's : At baseline                                                       Pertinent Vitals/Pain Pain Assessment: No/denies pain     Hand Dominance     Extremity/Trunk Assessment Upper Extremity Assessment Upper Extremity Assessment: Overall WFL for tasks assessed           Communication Communication Communication: No difficulties   Cognition Arousal/Alertness: Awake/alert Behavior During Therapy: WFL for tasks assessed/performed Overall Cognitive Status: Within Functional Limits for tasks assessed                                Home Living Family/patient expects to be discharged to:: Private residence Living Arrangements: Spouse/significant other Available Help at Discharge: Family Type of Home: House Home Access: Stairs to enter Technical brewer of Steps: 2   Home Layout: One level     Bathroom Shower/Tub: Teacher, early years/pre: Handicapped height     Home Equipment: TEFL teacher bars - tub/shower          Prior Functioning/Environment Level of Independence: Independent                                    Co-evaluation              End of Session    Activity Tolerance: Patient tolerated treatment  well Patient left: in bed;with call bell/phone within reach   Time: 1105-1119 OT Time Calculation (min): 14 min Charges:  OT General Charges $OT Visit: 1 Procedure OT Evaluation $OT Eval Moderate Complexity: 1 Procedure G-Codes: OT G-codes **NOT FOR INPATIENT CLASS** Functional Assessment Tool Used: clinical observation Functional Limitation: Self care Self Care Current Status (H9622): At least 1 percent but less than 20 percent impaired, limited or restricted Self Care Goal Status (W9798): At least 1 percent but less than 20 percent impaired, limited or restricted Self Care Discharge Status [phone]): At least 1 percent but less than 20 percent impaired, limited or restricted  Simpson, Thereasa Parkin 12/09/2015, 11:25 AM

## 2015-12-09 NOTE — Consult Note (Signed)
   [facility] CM Inpatient Consult   12/09/2015  Brad [name] Feb 07, 1956 [id number]   Patient is currently active with [facility] Care Management for chronic disease management services.  Patient has been engaged by a [facility] RN [employer] and Pharmacist.  Our community based plan of care has focused on disease management and community resource support. Patient was admitted for GI Bleed.  Met with the patient at bedside.  Patient consents to ongoing [place] Management follow up for diet and monitoring. Patient will receive a post discharge transition of care call and will be evaluated for monthly home visits for assessments and disease process education.  Made Inpatient Case Manager aware that [place] Management following. Of note, [facility] Care Management services does not replace or interfere with any services that are needed or arranged by inpatient case management or social work.  For additional questions or referrals please contact:  Natividad Brood, RN BSN [facility] Liaison  [phone] business mobile phone Toll free office [phone]

## 2015-12-10 ENCOUNTER — Other Ambulatory Visit: Payer: Self-pay | Admitting: *Deleted

## 2015-12-10 NOTE — Patient Outreach (Signed)
Member recently discharged from hospital after being admitted for anemia.  Discharged yesterday, call placed today to follow up on admission.  He state that he is "alright.  I feel better than I did a couple days ago but I'm still not feeling that well."  He reports some mild weakness and shortness of breath, but state that is has improved since his blood transfusion.  He denies any evidence of bleeding, but does complain of persistent swelling in his legs/feet.  He has a follow up appointment next week with [name].  He denies any urgent concerns at this time, home visit confirmed for next week.  [name], BSN, [place] Management  [facility] Care Manager [phone]

## 2015-12-12 NOTE — Assessment & Plan Note (Signed)
Asympt, stable overall by history and exam, recent data reviewed with pt, and pt to continue medical treatment as before,  to f/u any worsening symptoms or concerns Lab Results  Component Value Date   TSH 4.70* 12/07/2015

## 2015-12-12 NOTE — Assessment & Plan Note (Signed)
Mild to mod elev today, declines further meds, o/w stable overall by history and exam, recent data reviewed with pt, and pt to continue medical treatment as before,  to f/u any worsening symptoms or concerns BP Readings from Last 3 Encounters:  12/09/15 161/60  12/07/15 150/70  12/01/15 115/89

## 2015-12-12 NOTE — Assessment & Plan Note (Signed)
stable overall by history and exam, recent data reviewed with pt, and pt to continue medical treatment as before,  to f/u any worsening symptoms or concerns Lab Results  Component Value Date   LDLCALC 80 12/07/2015

## 2015-12-12 NOTE — Assessment & Plan Note (Signed)
stable overall by history and exam, recent data reviewed with pt, and pt to continue medical treatment as before,  to f/u any worsening symptoms or concerns Lab Results  Component Value Date   HGBA1C 5.7 12/07/2015

## 2015-12-12 NOTE — Assessment & Plan Note (Signed)
stable overall by history and exam, recent data reviewed with pt, and pt to continue medical treatment as before,  to f/u any worsening symptoms or concerns Lab Results  Component Value Date   WBC 6.2 12/09/2015   HGB 7.8* 12/09/2015   HCT 25.8* 12/09/2015   PLT 243 12/09/2015   GLUCOSE 107* 12/09/2015   CHOL 134 12/07/2015   TRIG 146.0 12/07/2015   HDL 24.90* 12/07/2015   LDLDIRECT 92.0 06/09/2015   LDLCALC 80 12/07/2015   ALT 12* 12/09/2015   AST 19 12/09/2015   NA 143 12/09/2015   K 3.3* 12/09/2015   CL 104 12/09/2015   CREATININE 1.00 12/09/2015   BUN 8 12/09/2015   CO2 28 12/09/2015   TSH 4.70* 12/07/2015   PSA 0.71 12/07/2015   INR 1.19 12/08/2015   HGBA1C 5.7 12/07/2015   MICROALBUR 437.5 Repeated and verified X2.* 06/09/2015

## 2015-12-12 NOTE — Assessment & Plan Note (Signed)
suspicous for OSA, for pulm referral  Note:  Total time for pt hx, exam, review of record with pt in the room, determination of diagnoses and plan for further eval and tx is > 40 min, with over 50% spent in coordination and counseling of patient

## 2015-12-13 NOTE — Consult Note (Signed)
Brad [name], Brad [name]             ACCOUNT NO.:  [id number]  MEDICAL RECORD NO.:  [id number]  LOCATION:                                 FACILITY:  PHYSICIAN:  Cyanne Delmar K. Shalisa Mcquade, M.D.DATE OF BIRTH:  02-22-1956  DATE OF CONSULTATION: DATE OF DISCHARGE:                                CONSULTATION   CLINICAL HISTORY: RT Carotid artery stenosis.  Examination .  Intracranial interpretation of right common carotid arteriogram before and after placement of a proximal right internal carotid artery/common carotid artery stent with distal protection.  The pre-stent placement right common carotid arteriogram demonstrates distal cervical,  the cavernous segment, and supraclinoid segments to be widely patent.  There is a proximal 50% stenosis of the petrous cavernous junction.  The right middle cerebral artery and the right anterior cerebral artery opacify into the capillary and venous phases.  Primary venous drainage  is into the  right sigmoid sinus and internal jugular vein.  Post-stent placement right common carotid arteriogram demonstrates brisk flow into  skull base and the right internal carotid artery. The cervical, the petrous, the cavernous, and the supraclinoid segments are seen to demonstrate brisk flow.  There continues to be no change in the proximal 50% stenosis of the right internal carotid artery at the petrous cavernous junction.  No angiographic evidence of intraluminal filling defects, occlusions, stenosis, or dissections are seen.    Incidental note is made of a moderate narrowing of the right pericallosal artery, probably related to  intracranial arteriosclerosis.  This is also noted on the pre-stent placement arteriogram.  IMPRESSION: 1. Apart from increased brisk flow into the right anterior circulation     following placement of proximal stent, no new filling defects , stenoses,     dissections, or aneurysms are seen. 2. Stable right pericallosal artery   smooth  narrowing, probably     representing intracranial arteriosclerosis.          ______________________________ Fritz Pickerel Estanislado Pandy, M.D.     SKD/MEDQ  D:  12/09/2015  T:  12/10/2015  Job:  [id number]

## 2015-12-15 ENCOUNTER — Other Ambulatory Visit: Payer: Self-pay | Admitting: *Deleted

## 2015-12-15 ENCOUNTER — Telehealth: Payer: Self-pay | Admitting: *Deleted

## 2015-12-15 ENCOUNTER — Encounter: Payer: Self-pay | Admitting: *Deleted

## 2015-12-15 ENCOUNTER — Inpatient Hospital Stay: Payer: Medicare Other | Admitting: Internal Medicine

## 2015-12-15 MED ORDER — FUROSEMIDE 80 MG PO TABS: 80.0000 mg | ORAL_TABLET | Freq: Two times a day (BID) | ORAL | Status: DC

## 2015-12-15 NOTE — Patient Outreach (Signed)
[facility]) Care Management   12/15/2015  Brad [name] 03-Nov-1955 [id number]  Brad [name] is an 60 y.o. male  Subjective:   Member states he is "doing all right."  He reports taking all medications as prescribed.  He inquires about his updated dose of Furosemide, which has been increased to 89m twice a day.  He states that he has enough to last a few more days with the increased dose, will need a new prescription called to the pharmacy.  State that he still feels tired at times, but denies feelings of weakness that lead to hospitalization for anemia, requiring blood transfusion.  Objective:   Review of Systems  Constitutional: Negative.   HENT: Negative.   Eyes: Negative.   Respiratory: Negative.   Cardiovascular: Positive for leg swelling.  Gastrointestinal: Negative.   Genitourinary: Negative.   Musculoskeletal: Positive for neck pain.  Skin: Negative.   Neurological: Negative.   Endo/Heme/Allergies: Negative.   Psychiatric/Behavioral: Negative.     Physical Exam  Constitutional: He is oriented to person, place, and time. He appears well-developed and well-nourished.  Neck: Normal range of motion.  Cardiovascular: Normal rate, regular rhythm and normal heart sounds.   Respiratory: Effort normal and breath sounds normal.  GI: Soft. Bowel sounds are normal.  Musculoskeletal: Normal range of motion.  Neurological: He is alert and oriented to person, place, and time.  Skin: Skin is warm and dry.    BP 158/78 mmHg  Pulse 76  Resp 20  SpO2 98%   Encounter Medications:   Outpatient Encounter Prescriptions as of 12/15/2015  Medication Sig Note  . amLODipine (NORVASC) 5 MG tablet Take 1 tablet (5 mg total) by mouth daily.   . Blood Pressure Monitor DEVI Use to check Blood pressure daily 09/07/2015: Patient reported 09/07/2015 he does not have a BP cuff.   . ferrous sulfate 325 (65 FE) MG tablet Take 1 tablet (325 mg total) by mouth 3 (three) times  daily with meals.   . furosemide (LASIX) 80 MG tablet Take 1 tablet (80 mg total) by mouth 2 (two) times daily. 12/08/2015: Not yet started this dose  . glucose blood (ACCU-CHEK AVIVA PLUS) test strip Use to check blood sugars daily Dx E11.9   . ipratropium (ATROVENT HFA) 17 MCG/ACT inhaler Inhale 2 puffs into the lungs 4 (four) times daily.   . Lancets MISC 1 each by Other route See admin instructions. Reported on 09/07/2015   . Ledipasvir-Sofosbuvir (HARVONI) 90-400 MG TABS Take 1 tablet by mouth daily.   .Marland Kitchenlevothyroxine (SYNTHROID, LEVOTHROID) 112 MCG tablet Take 1 tablet (112 mcg total) by mouth daily.   .Marland Kitchenlisinopril (PRINIVIL,ZESTRIL) 20 MG tablet Take 1 tablet (20 mg total) by mouth daily.   . metFORMIN (GLUCOPHAGE-XR) 500 MG 24 hr tablet Take 2 tablets (1,000 mg total) by mouth daily with breakfast.   . metoprolol succinate (TOPROL-XL) 50 MG 24 hr tablet Take 1 tablet (50 mg total) by mouth daily. Take with or immediately following a meal.   . pantoprazole (PROTONIX) 40 MG tablet Take 1 tablet (40 mg total) by mouth 2 (two) times daily.   .Marland KitchenPROAIR HFA 108 (90 BASE) MCG/ACT inhaler INHALE 2 PUFFS BY MOUTH EVERY 6 HOURS AS NEEDED FOR WHEEZING OR SHORTNESS OF BREATH   . Respiratory Therapy Supplies (NEBULIZER) DEVI Use as directed four times daily   . tiZANidine (ZANAFLEX) 4 MG tablet TAKE 1 TABLET BY MOUTH EVERY 6 HOURS AS NEEDED FOR MUSCLE SPASMS   .  traMADol (ULTRAM) 50 MG tablet TAKE 1-2 TABLETS BY MOUTH UP TO FOUR TIMES DAILY    No facility-administered encounter medications on file as of 12/15/2015.    Functional Status:   In your present state of health, do you have any difficulty performing the following activities: 12/08/2015 11/30/2015  Hearing? N N  Vision? N Y  Difficulty concentrating or making decisions? N Y  Walking or climbing stairs? N Y  Dressing or bathing? N N  Doing errands, shopping? N Y    Fall/Depression Screening:    PHQ 2/9 Scores 11/24/2015 09/21/2015 06/14/2015   PHQ - 2 Score 6 0 0  PHQ- 9 Score 12 - -    Assessment:    Member canceled his follow up appointment with his PCP today due to wife being released from hospital.  He rescheduled it for next week.  He also has a cardiologist appointment the same day.  Discussed the importance of seeing both physicians, however they are scheduled 15 minutes apart.  He state he will cancel his cardiology appointment due to not having the copay, and only see his PCP.  Informed of the importance of rescheduling the cardiology appointment for a different day, he is hesitant, but state he will think about it.   He continues to have swelling in his legs, state that it is better when lying down or with legs elevated.  He state that he has been self monitoring his weights, blood pressure, and blood sugars, but has not kept a record of it.  Strongly encouraged to keep records and share with physicians.  He verbalizes understanding.  Wife now has home O2, discussed smoking cessation and the dangers of cigarettes with oxygen.  He verbalizes understanding, stating that he knows they both need to quit.  He state that he has decreased the amount of smoking, and state he will continue to decrease until he quits.  Member denies any further concerns, encouraged to contact this care manager with questions.  Plan:   Routine home visit scheduled for next month.  [facility]N CM Care Plan Problem One        Most Recent Value   Care Plan Problem One  Elevated blood pressure   Role Documenting the Problem One  Care Management Coordinator   Care Plan for Problem One  Active   THN Long Term Goal (31-90 days)  Member's blood pressure will consistently be within target range within the next 45 days   THN Long Term Goal Start Date  11/16/15   Interventions for Problem One Long Term Goal  Discussed complications of elevated blood pressure, including stroke and heart attack.   [facility] CM Short Term Goal #1 (0-30 days)  Member will report taking all  medications as prescribed over the next 4 weeks   [facility] CM Short Term Goal #1 Start Date  11/16/15   [facility] CM Short Term Goal #1 Met Date  12/15/15   Interventions for Short Term Goal #1  Discussed importance of taking medications as directed in effort to control blood pressure and decrease risk of additional strokes.  Discussed importance of taking all medications, including Norvasc   [facility]N CM Short Term Goal #2 (0-30 days)  Member will attend cardiology appointment this week   [facility] CM Short Term Goal #2 Start Date  11/16/15   [facility] CM Short Term Goal #2 Met Date  -- [Goal not met]   Interventions for Short Term Goal #2  Appointment confirmed for tomorrow, transportation confirmed.  Discussed  the importance of following up with physician to assess current symptoms (numbness on left side, swelling in legs)    THN CM Care Plan Problem Two        Most Recent Value   Care Plan Problem Two  Leg swelling   Role Documenting the Problem Two  Care Management Coordinator   Care Plan for Problem Two  Active   THN CM Short Term Goal #1 (0-30 days)  Member will report decrease in leg swelling over the next 4 weeks   THN CM Short Term Goal #1 Start Date  12/08/15   Interventions for Short Term Goal #2   Discussed with member the importance of taking diuretic as prescribed, call placed to physician office to request updated prescription (dose change) to be sent to pharmacy   [facility] CM Short Term Goal #2 (0-30 days)  Member will keep/attend follow up appointment next week   [facility] CM Short Term Goal #2 Start Date  12/15/15   Interventions for Short Term Goal #2  Discussed importance in keeping scheduled appointment (original follow up canceled)     [name], BSN, [place] Manager [phone]

## 2015-12-15 NOTE — Telephone Encounter (Signed)
Left msg on triage stating pt is needing updated script on his Furosemide. He was told to increase to 80 mg BID. Needing rx sent to [facility]. [facility]. Per chart MD sent rx on 5/2 to [place]. Will resend to [facility] per request.../lmb

## 2015-12-16 ENCOUNTER — Encounter: Payer: Self-pay | Admitting: Internal Medicine

## 2015-12-21 ENCOUNTER — Other Ambulatory Visit (INDEPENDENT_AMBULATORY_CARE_PROVIDER_SITE_OTHER): Payer: Medicare Other

## 2015-12-21 ENCOUNTER — Encounter: Payer: Medicare Other | Admitting: Cardiology

## 2015-12-21 ENCOUNTER — Encounter: Payer: Self-pay | Admitting: Internal Medicine

## 2015-12-21 ENCOUNTER — Other Ambulatory Visit: Payer: Self-pay | Admitting: Internal Medicine

## 2015-12-21 ENCOUNTER — Ambulatory Visit (INDEPENDENT_AMBULATORY_CARE_PROVIDER_SITE_OTHER): Payer: Medicare Other | Admitting: Internal Medicine

## 2015-12-21 ENCOUNTER — Telehealth: Payer: Self-pay | Admitting: *Deleted

## 2015-12-21 VITALS — BP 144/72 | HR 65 | Temp 98.1°F | Resp 20 | Wt 278.0 lb

## 2015-12-21 DIAGNOSIS — I639 Cerebral infarction, unspecified: Secondary | ICD-10-CM | POA: Diagnosis not present

## 2015-12-21 DIAGNOSIS — D649 Anemia, unspecified: Secondary | ICD-10-CM

## 2015-12-21 DIAGNOSIS — K922 Gastrointestinal hemorrhage, unspecified: Secondary | ICD-10-CM | POA: Diagnosis not present

## 2015-12-21 DIAGNOSIS — I5032 Chronic diastolic (congestive) heart failure: Secondary | ICD-10-CM | POA: Diagnosis not present

## 2015-12-21 DIAGNOSIS — Z8673 Personal history of transient ischemic attack (TIA), and cerebral infarction without residual deficits: Secondary | ICD-10-CM

## 2015-12-21 LAB — CBC WITH DIFFERENTIAL/PLATELET
Basophils Absolute: 0.1 10*3/uL (ref 0.0–0.1)
Basophils Relative: 1.1 % (ref 0.0–3.0)
Eosinophils Absolute: 0.2 10*3/uL (ref 0.0–0.7)
Eosinophils Relative: 2.7 % (ref 0.0–5.0)
HCT: 24.8 % — ABNORMAL LOW (ref 39.0–52.0)
Hemoglobin: 7.8 g/dL — CL (ref 13.0–17.0)
Lymphocytes Relative: 18.9 % (ref 12.0–46.0)
Lymphs Abs: 1.3 10*3/uL (ref 0.7–4.0)
MCHC: 31.6 g/dL (ref 30.0–36.0)
MCV: 69 fl — ABNORMAL LOW (ref 78.0–100.0)
Monocytes Absolute: 0.6 10*3/uL (ref 0.1–1.0)
Monocytes Relative: 8.6 % (ref 3.0–12.0)
Neutro Abs: 4.7 10*3/uL (ref 1.4–7.7)
Neutrophils Relative %: 68.7 % (ref 43.0–77.0)
Platelets: 328 10*3/uL (ref 150.0–400.0)
RBC: 3.6 Mil/uL — ABNORMAL LOW (ref 4.22–5.81)
RDW: 19.9 % — ABNORMAL HIGH (ref 11.5–15.5)
WBC: 6.9 10*3/uL (ref 4.0–10.5)

## 2015-12-21 LAB — BASIC METABOLIC PANEL
BUN: 18 mg/dL (ref 6–23)
CO2: 31 mEq/L (ref 19–32)
Calcium: 8.2 mg/dL — ABNORMAL LOW (ref 8.4–10.5)
Chloride: 101 mEq/L (ref 96–112)
Creatinine, Ser: 1.02 mg/dL (ref 0.40–1.50)
GFR: 79.7 mL/min (ref >60.00)
Glucose, Bld: 180 mg/dL — ABNORMAL HIGH (ref 70–99)
Potassium: 3.1 mEq/L — ABNORMAL LOW (ref 3.5–5.1)
Sodium: 140 mEq/L (ref 135–145)

## 2015-12-21 MED ORDER — ASPIRIN EC 81 MG PO TBEC: 81.0000 mg | DELAYED_RELEASE_TABLET | Freq: Every day | ORAL | Status: DC

## 2015-12-21 MED ORDER — POTASSIUM CHLORIDE ER 10 MEQ PO TBCR: EXTENDED_RELEASE_TABLET | ORAL | Status: DC

## 2015-12-21 NOTE — Progress Notes (Signed)
Pre visit review using our clinic review tool, if applicable. No additional management support is needed unless otherwise documented below in the visit note. 

## 2015-12-21 NOTE — Telephone Encounter (Signed)
Pt's Hgb is critical at 7.8. Hct is 24.8.

## 2015-12-21 NOTE — Assessment & Plan Note (Signed)
symptomaticlaly improved, for f/u cbc as above,

## 2015-12-21 NOTE — Assessment & Plan Note (Signed)
Not clear overall improved by wt, exam seems to indeed slightly less edema, I hesitate to increase lasix over current dosing, asked pt to limit fluids somewhat daily, for f/u cr, K today

## 2015-12-21 NOTE — Assessment & Plan Note (Signed)
No overt bleeding, for f/u cbc today, cont iron at least once daily, still declines GI f/u

## 2015-12-21 NOTE — Progress Notes (Signed)
      Patient did not show for appointment. Candee Furbish, MD               ROS

## 2015-12-21 NOTE — Patient Instructions (Addendum)
Please continue all other medications as before, and refills have been done if requested.  Please have the pharmacy call with any other refills you may need.  Please continue your efforts at being more active, low cholesterol diet, and weight control.  Please go to the LAB in the Basement (turn left off the elevator) for the tests to be done today  \You will be contacted by phone if any changes need to be made immediately.  Otherwise, you will receive a letter about your results with an explanation, but please check with MyChart first.  Please remember to sign up for MyChart if you have not done so, as this will be important to you in the future with finding out test results, communicating by private email, and scheduling acute appointments online when needed.  Please keep your appointments with your specialists as you may have planned  Please return in 2 months, or sooner if needed

## 2015-12-21 NOTE — Progress Notes (Signed)
Subjective:    Patient ID: Brad [name], male    DOB: 16-May-1956, 61 y.o.   MRN: [id number]  HPI  Here to f/u recent GI bleeding, symptomatic anemia s/p 2 u PRBC, and recommendation for EGD/colon per GI but refused.  ASA/plavix stopped, D/c hgb 7.8. Taking iron sulfate 325 gm maybe once per day, just not good about taking it.  Is taking the PPI.  Still not interested in GI f/u.  Wife recently hospd as well with heart disease and he is feeling overwhelmed. He is ok with having the liver u/s planned per Dr Linus Salmons however,. Overall feels more energetic, no overt blood loss post d/c and Pt denies chest pain, increased sob or doe, wheezing, orthopnea, PND, increased LE swelling, palpitations, dizziness or syncope.  Still has significant LE edema despite the increased lasix to 80 bid, but he feels legs are less swollen, though wts have little change overall Wt Readings from Last 3 Encounters:  12/21/15 278 lb (126.1 kg)  12/09/15 268 lb 14.4 oz (121.972 kg)  12/07/15 276 lb (125.193 kg)  Pt denies new neurological symptoms such as new headache, or facial or extremity weakness or numbness, no longer on Asa , plavix.  Is taking the statin.   Pt denies polydipsia, polyuria,  Pt denies fever, wt loss, night sweats, loss of appetite, or other constitutional symptoms Past Medical History  Diagnosis Date  . Depression   . GERD (gastroesophageal reflux disease)   . Hyperlipidemia 08/27/2014    takes Simvastatin daily  . Hypertension     takes Amlodipine and Lisinopril daily  . Hypothyroidism 08/27/2014    takes Synthroid daily  . Anemia     takes Ferrous Sulfated daily  . Carotid stenosis 08/27/2014    takes Aspirin daily  . Diabetes mellitus type II     takes Metformin and Glipizide daily  . Muscle spasm     takes Zanaflex daily as needed  . Insomnia     takes Pamelor nightly  . COPD (chronic obstructive pulmonary disease) (HCC)     Albuterol daily as needed and Spiriva daily  . MVP (mitral  valve prolapse)   . History of blood clots     in right leg;being followed by Dr.Brabham for this  . Shortness of breath dyspnea   . Peripheral neuropathy ([place])   . Headache     occasionally  . Dizziness   . Stroke ([place])   . Arthritis   . Joint pain     hips  . Peripheral edema     takes Furosemide daily as needed  . History of hiatal hernia   . History of gastric ulcer   . Urinary frequency   . Urinary urgency   . History of blood transfusion 09/2014    2 units-no abnormal reaction noted  . HTN (hypertension) 09/14/2015  . Chronic hepatitis C ([place]) 09/17/2015  . Prurigo nodularis   . GI bleed 12/2015   Past Surgical History  Procedure Laterality Date  . Neg stress test  2000  . Rotator cuff repair Right 2009    multiple f/u Sxs/infection Tx  . Incision and drainage perirectal abscess  03/2009  . Esophagogastroduodenoscopy N/A 09/24/2014    Procedure: ESOPHAGOGASTRODUODENOSCOPY (EGD);  Surgeon: Winfield Cunas., MD;  [facility] ENDOSCOPY;  Service: Endoscopy;  Laterality: N/A;  . I&d of right arm    . Endarterectomy Left 11/13/2014    Procedure: ENDARTERECTOMY CAROTID WITH PATCH ANGIOPLASTY;  Surgeon: Serafina Mitchell,  MD;  Location: [facility];  Service: Vascular;  Laterality: Left;  . Peripheral vascular catheterization N/A 11/30/2015    Procedure: Carotid PTA/Stent Intervention;  Surgeon: Serafina Mitchell, MD;  Location: [facility] CV LAB;  Service: Cardiovascular;  Laterality: N/A;    reports that he has been smoking Cigarettes.  He has a 48 pack-year smoking history. He has never used smokeless tobacco. He reports that he drinks about 0.6 oz of alcohol per week. He reports that he does not use illicit drugs. family history includes Alcohol abuse in his father; Diabetes type II in his mother; Heart disease in his mother; Lung cancer in his father, maternal grandmother, and sister. Allergies  Allergen Reactions  . Doxycycline Hives and Itching  . Lipitor [Atorvastatin] Other (See  Comments)    Myalgia   . Oxycodone Itching   Current Outpatient Prescriptions on File Prior to Visit  Medication Sig Dispense Refill  . amLODipine (NORVASC) 5 MG tablet Take 1 tablet (5 mg total) by mouth daily. 90 tablet 3  . Blood Pressure Monitor DEVI Use to check Blood pressure daily 1 Device 0  . ferrous sulfate 325 (65 FE) MG tablet Take 1 tablet (325 mg total) by mouth 3 (three) times daily with meals. 30 tablet 3  . furosemide (LASIX) 80 MG tablet Take 1 tablet (80 mg total) by mouth 2 (two) times daily. 60 tablet 11  . glucose blood (ACCU-CHEK AVIVA PLUS) test strip Use to check blood sugars daily Dx E11.9 100 each 3  . ipratropium (ATROVENT HFA) 17 MCG/ACT inhaler Inhale 2 puffs into the lungs 4 (four) times daily. 3 Inhaler 3  . Lancets MISC 1 each by Other route See admin instructions. Reported on 09/07/2015    . Ledipasvir-Sofosbuvir (HARVONI) 90-400 MG TABS Take 1 tablet by mouth daily. 28 tablet 2  . levothyroxine (SYNTHROID, LEVOTHROID) 112 MCG tablet Take 1 tablet (112 mcg total) by mouth daily. 90 tablet 3  . lisinopril (PRINIVIL,ZESTRIL) 20 MG tablet Take 1 tablet (20 mg total) by mouth daily. 90 tablet 0  . metFORMIN (GLUCOPHAGE-XR) 500 MG 24 hr tablet Take 2 tablets (1,000 mg total) by mouth daily with breakfast. 270 tablet 3  . metoprolol succinate (TOPROL-XL) 50 MG 24 hr tablet Take 1 tablet (50 mg total) by mouth daily. Take with or immediately following a meal. 90 tablet 3  . pantoprazole (PROTONIX) 40 MG tablet Take 1 tablet (40 mg total) by mouth 2 (two) times daily. 60 tablet 0  . PROAIR HFA 108 (90 BASE) MCG/ACT inhaler INHALE 2 PUFFS BY MOUTH EVERY 6 HOURS AS NEEDED FOR WHEEZING OR SHORTNESS OF BREATH 8.5 g 11  . Respiratory Therapy Supplies (NEBULIZER) DEVI Use as directed four times daily 1 each 0  . tiZANidine (ZANAFLEX) 4 MG tablet TAKE 1 TABLET BY MOUTH EVERY 6 HOURS AS NEEDED FOR MUSCLE SPASMS 60 tablet 0  . traMADol (ULTRAM) 50 MG tablet TAKE 1-2 TABLETS BY  MOUTH UP TO FOUR TIMES DAILY 120 tablet 5   No current facility-administered medications on file prior to visit.   Review of Systems  Constitutional: Negative for unusual diaphoresis or night sweats HENT: Negative for ear swelling or discharge Eyes: Negative for worsening visual haziness  Respiratory: Negative for choking and stridor.   Gastrointestinal: Negative for distension or worsening eructation Genitourinary: Negative for retention or change in urine volume.  Musculoskeletal: Negative for other MSK pain or swelling Skin: Negative for color change and worsening wound Neurological: Negative for tremors and  numbness other than noted  Psychiatric/Behavioral: Negative for decreased concentration or agitation other than above       Objective:   Physical Exam BP 144/72 mmHg  Pulse 65  Temp(Src) 98.1 F (36.7 C) (Oral)  Resp 20  Wt 278 lb (126.1 kg)  SpO2 97% VS noted,  Constitutional: Pt appears in no apparent distress HENT: Head: NCAT.  Right Ear: External ear normal.  Left Ear: External ear normal.  Eyes: . Pupils are equal, round, and reactive to light. Conjunctivae and EOM are normal Neck: Normal range of motion. Neck supple.  Cardiovascular: Normal rate and regular rhythm.   Pulmonary/Chest: Effort normal and breath sounds without rales or wheezing.  Abd:  Soft, NT, ND, + BS Neurological: Pt is alert. Not confused , motor grossly intact Skin: Skin is warm. No rash, 1-2+bilat LE edema Psychiatric: Pt behavior is normal. No agitation. mild nervous    Assessment & Plan:

## 2015-12-21 NOTE — Assessment & Plan Note (Signed)
Hx of cva and carotid dz, consider re-start at least the asa 81 mg daily if hgb stable

## 2015-12-23 ENCOUNTER — Ambulatory Visit (HOSPITAL_COMMUNITY)
Admission: RE | Admit: 2015-12-23 | Discharge: 2015-12-23 | Disposition: A | Discharge status: Home / Self Care | Payer: Medicare Other | Source: Ambulatory Visit | Attending: Internal Medicine | Admitting: Internal Medicine

## 2015-12-23 DIAGNOSIS — B1920 Unspecified viral hepatitis C without hepatic coma: Secondary | ICD-10-CM | POA: Diagnosis not present

## 2015-12-23 DIAGNOSIS — B182 Chronic viral hepatitis C: Secondary | ICD-10-CM | POA: Insufficient documentation

## 2015-12-27 MED FILL — *HARVONI 90-400 MG TABLET: 90-400 | 28 days supply | Qty: 28 | Fill #0

## 2015-12-28 ENCOUNTER — Encounter: Payer: Self-pay | Admitting: Pharmacy Technician

## 2015-12-31 ENCOUNTER — Encounter: Payer: Self-pay | Admitting: Cardiology

## 2016-01-04 ENCOUNTER — Encounter: Payer: Self-pay | Admitting: Surgery

## 2016-01-06 ENCOUNTER — Ambulatory Visit (HOSPITAL_COMMUNITY)
Admission: RE | Admit: 2016-01-06 | Discharge: 2016-01-06 | Disposition: A | Discharge status: Home / Self Care | Payer: Medicare Other | Source: Ambulatory Visit | Attending: Surgery | Admitting: Surgery

## 2016-01-06 DIAGNOSIS — Z9862 Peripheral vascular angioplasty status: Secondary | ICD-10-CM | POA: Diagnosis not present

## 2016-01-06 DIAGNOSIS — I10 Essential (primary) hypertension: Secondary | ICD-10-CM | POA: Insufficient documentation

## 2016-01-06 DIAGNOSIS — E1142 Type 2 diabetes mellitus with diabetic polyneuropathy: Secondary | ICD-10-CM | POA: Insufficient documentation

## 2016-01-06 DIAGNOSIS — F329 Major depressive disorder, single episode, unspecified: Secondary | ICD-10-CM | POA: Insufficient documentation

## 2016-01-06 DIAGNOSIS — I6523 Occlusion and stenosis of bilateral carotid arteries: Secondary | ICD-10-CM

## 2016-01-06 DIAGNOSIS — E785 Hyperlipidemia, unspecified: Secondary | ICD-10-CM | POA: Insufficient documentation

## 2016-01-06 DIAGNOSIS — K219 Gastro-esophageal reflux disease without esophagitis: Secondary | ICD-10-CM | POA: Insufficient documentation

## 2016-01-06 DIAGNOSIS — Z7984 Long term (current) use of oral hypoglycemic drugs: Secondary | ICD-10-CM | POA: Insufficient documentation

## 2016-01-08 ENCOUNTER — Other Ambulatory Visit: Payer: Self-pay | Admitting: Internal Medicine

## 2016-01-10 ENCOUNTER — Encounter (HOSPITAL_COMMUNITY): Payer: Medicare Other

## 2016-01-10 ENCOUNTER — Encounter: Payer: Self-pay | Admitting: Surgery

## 2016-01-10 ENCOUNTER — Ambulatory Visit (INDEPENDENT_AMBULATORY_CARE_PROVIDER_SITE_OTHER): Payer: Medicare Other | Admitting: Surgery

## 2016-01-10 ENCOUNTER — Ambulatory Visit: Payer: Medicare Other | Admitting: Surgery

## 2016-01-10 VITALS — BP 156/61 | HR 65 | Temp 98.3°F | Resp 18 | Ht 71.0 in | Wt 284.1 lb

## 2016-01-10 DIAGNOSIS — I63239 Cerebral infarction due to unspecified occlusion or stenosis of unspecified carotid arteries: Secondary | ICD-10-CM

## 2016-01-10 NOTE — Progress Notes (Signed)
Filed Vitals:   01/10/16 1148 01/10/16 1153  BP: 151/68 156/61  Pulse: 65   Temp: 98.3 F (36.8 C)   TempSrc: Oral   Resp: 18   Height: '5\' 11"'$  (1.803 m)   Weight: 284 lb 1.6 oz (128.867 kg)   SpO2: 99%

## 2016-01-10 NOTE — Progress Notes (Signed)
Patient name: Brad [name] MRN: [id number] DOB: [birth year] Sex: male  REASON FOR VISIT: post-op  HPI: Brad [name] is a 60 y.o. male who is status post left carotid endarterectomy on 11/13/2014. He was having right-sided facial weakness and blurry vision. He was recently in the hospital February 2017 with left leg and arm numbness and weakness as well as dizziness and amaurosis. He had a carotid duplex that showed 60-79 percent right carotid stenosis stenosis. He was scheduled for right carotid endarterectomy but left the hospital AMA. He did go to see ENT for evaluation of his hoarseness. He does have a left vocal cord paralysis which is mild.  He underwent right carotid stenting on 11/30/2015.  This procedure was uneventful.  He still has residual numbness and tingling in his left arm which was present before the procedure  He has been complaining of worsening swelling in his legs for proximally 6 months.  This is affecting his ability to walk.  He says that he got a lower extremity Doppler study recently that showed a small abnormality on the right but that the left leg looked good  Current Outpatient Prescriptions  Medication Sig Dispense Refill  . amLODipine (NORVASC) 5 MG tablet Take 1 tablet (5 mg total) by mouth daily. 90 tablet 3  . aspirin EC 81 MG tablet Take 1 tablet (81 mg total) by mouth daily. 90 tablet 11  . Blood Pressure Monitor DEVI Use to check Blood pressure daily 1 Device 0  . ferrous sulfate 325 (65 FE) MG tablet Take 1 tablet (325 mg total) by mouth 3 (three) times daily with meals. 30 tablet 3  . furosemide (LASIX) 80 MG tablet Take 1 tablet (80 mg total) by mouth 2 (two) times daily. 60 tablet 11  . glucose blood (ACCU-CHEK AVIVA PLUS) test strip Use to check blood sugars daily Dx E11.9 100 each 3  . ipratropium (ATROVENT HFA) 17 MCG/ACT inhaler Inhale 2 puffs into the lungs 4 (four) times daily. 3 Inhaler 3  . Lancets  MISC 1 each by Other route See admin instructions. Reported on 09/07/2015    . Ledipasvir-Sofosbuvir (HARVONI) 90-400 MG TABS Take 1 tablet by mouth daily. 28 tablet 2  . levothyroxine (SYNTHROID, LEVOTHROID) 112 MCG tablet Take 1 tablet (112 mcg total) by mouth daily. 90 tablet 3  . lisinopril (PRINIVIL,ZESTRIL) 20 MG tablet Take 1 tablet (20 mg total) by mouth daily. 90 tablet 0  . metFORMIN (GLUCOPHAGE-XR) 500 MG 24 hr tablet Take 2 tablets (1,000 mg total) by mouth daily with breakfast. 270 tablet 3  . metoprolol succinate (TOPROL-XL) 50 MG 24 hr tablet Take 1 tablet (50 mg total) by mouth daily. Take with or immediately following a meal. 90 tablet 3  . pantoprazole (PROTONIX) 40 MG tablet Take 1 tablet (40 mg total) by mouth 2 (two) times daily. 60 tablet 0  . potassium chloride (K-DUR) 10 MEQ tablet 2 tabs by mouth per day in the AM 180 tablet 3  . PROAIR HFA 108 (90 BASE) MCG/ACT inhaler INHALE 2 PUFFS BY MOUTH EVERY 6 HOURS AS NEEDED FOR WHEEZING OR SHORTNESS OF BREATH 8.5 g 11  . Respiratory Therapy Supplies (NEBULIZER) DEVI Use as directed four times daily 1 each 0  . simvastatin (ZOCOR) 40 MG tablet TAKE 1 TABLET(40 MG) BY MOUTH AT BEDTIME 90 tablet 0  . tiZANidine (ZANAFLEX) 4 MG tablet TAKE 1 TABLET BY MOUTH EVERY 6 HOURS AS NEEDED FOR MUSCLE SPASMS 60 tablet 0  .  traMADol (ULTRAM) 50 MG tablet TAKE 1-2 TABLETS BY MOUTH UP TO FOUR TIMES DAILY 120 tablet 5   No current facility-administered medications for this visit.    REVIEW OF SYSTEMS:  '[X]'$  denotes positive finding, '[ ]'$  denotes negative finding Cardiac  Comments:  Chest pain or chest pressure:    Shortness of breath upon exertion:    Short of breath when lying flat:    Irregular heart rhythm:    Constitutional    Fever or chills:      PHYSICAL EXAM: Filed Vitals:   01/10/16 1148 01/10/16 1153  BP: 151/68 156/61  Pulse: 65   Temp: 98.3 F (36.8 C)   TempSrc: Oral   Resp: 18   Height: '5\' 11"'$  (1.803 m)   Weight: 284  lb 1.6 oz (128.867 kg)   SpO2: 99%     GENERAL: The patient is a well-nourished male, in no acute distress. The vital signs are documented above. CARDIOVASCULAR: There is a regular rate and rhythm. PULMONARY: There is good air exchange bilaterally without wheezing or rales.   MEDICAL ISSUES: Carotid stenosis: By ultrasound, both carotid arteries are widely patent without evidence of restenosis.  He will follow up in 6 months for a repeat study  Leg swelling: I have encouraged the patient to get thigh-high compression stockings.  He describes having a Doppler study, however I cannot find these results.  May have been done outside the current system.  I have encouraged him to follow up with this.  [name], MD [facility] [phone] Pager [phone]

## 2016-01-11 ENCOUNTER — Other Ambulatory Visit: Payer: Self-pay | Admitting: Internal Medicine

## 2016-01-11 ENCOUNTER — Other Ambulatory Visit: Payer: Self-pay | Admitting: *Deleted

## 2016-01-11 NOTE — Patient Outreach (Signed)
[facility]) Care Management   01/11/2016  Brad [name] 06-Mar-1956 [id number]  Brad [name] is an 60 y.o. male  Subjective:   Member reports that he is "fine."  He continues to complain of leg swelling and "burning" when he walks.  He state he has seen both his PCP and the vein/vascular specialist within the last several weeks and was instructed to decrease his fluid intake as well as use thigh high compression stockings.  He denies doing either of the two yet.  He state he did purchase the knee high compression stockings, but will purchase the thigh high ones later this week.    Objective:   Review of Systems  Constitutional: Negative.   HENT: Negative.   Eyes: Negative.   Respiratory: Positive for shortness of breath.        With activity  Cardiovascular: Positive for leg swelling.  Gastrointestinal: Negative.   Genitourinary: Negative.   Musculoskeletal: Negative.   Skin: Negative.   Neurological: Negative.   Endo/Heme/Allergies: Negative.   Psychiatric/Behavioral: Negative.     Physical Exam  Constitutional: He is oriented to person, place, and time. He appears well-developed and well-nourished.  Neck: Normal range of motion.  Cardiovascular: Normal rate, regular rhythm and normal heart sounds.   Respiratory: Effort normal and breath sounds normal.  GI: Soft. Bowel sounds are normal. He exhibits distension.  Musculoskeletal: Normal range of motion.  Neurological: He is alert and oriented to person, place, and time.  Skin: Skin is warm and dry.    BP 168/84 mmHg  Pulse 67  Resp 20  SpO2 98%   Encounter Medications:   Outpatient Encounter Prescriptions as of 01/11/2016  Medication Sig Note  . aspirin EC 81 MG tablet Take 1 tablet (81 mg total) by mouth daily.   . Blood Pressure Monitor DEVI Use to check Blood pressure daily 09/07/2015: Patient reported 09/07/2015 he does not have a BP cuff.   . ferrous sulfate 325 (65 FE) MG tablet Take 1  tablet (325 mg total) by mouth 3 (three) times daily with meals.   . furosemide (LASIX) 80 MG tablet Take 1 tablet (80 mg total) by mouth 2 (two) times daily.   Marland Kitchen glucose blood (ACCU-CHEK AVIVA PLUS) test strip Use to check blood sugars daily Dx E11.9   . ipratropium (ATROVENT HFA) 17 MCG/ACT inhaler Inhale 2 puffs into the lungs 4 (four) times daily.   . Lancets MISC 1 each by Other route See admin instructions. Reported on 09/07/2015   . Ledipasvir-Sofosbuvir (HARVONI) 90-400 MG TABS Take 1 tablet by mouth daily.   Marland Kitchen levothyroxine (SYNTHROID, LEVOTHROID) 112 MCG tablet Take 1 tablet (112 mcg total) by mouth daily.   Marland Kitchen lisinopril (PRINIVIL,ZESTRIL) 20 MG tablet Take 1 tablet (20 mg total) by mouth daily.   . metFORMIN (GLUCOPHAGE-XR) 500 MG 24 hr tablet Take 2 tablets (1,000 mg total) by mouth daily with breakfast.   . metoprolol succinate (TOPROL-XL) 50 MG 24 hr tablet Take 1 tablet (50 mg total) by mouth daily. Take with or immediately following a meal.   . pantoprazole (PROTONIX) 40 MG tablet Take 1 tablet (40 mg total) by mouth 2 (two) times daily.   . potassium chloride (K-DUR) 10 MEQ tablet 2 tabs by mouth per day in the AM   . simvastatin (ZOCOR) 40 MG tablet TAKE 1 TABLET(40 MG) BY MOUTH AT BEDTIME   . tiZANidine (ZANAFLEX) 4 MG tablet TAKE 1 TABLET BY MOUTH EVERY 6 HOURS AS NEEDED  FOR MUSCLE SPASMS   . traMADol (ULTRAM) 50 MG tablet TAKE 1-2 TABLETS BY MOUTH UP TO FOUR TIMES DAILY   . amLODipine (NORVASC) 5 MG tablet Take 1 tablet (5 mg total) by mouth daily. (Patient not taking: Reported on 01/11/2016)   . PROAIR HFA 108 (90 BASE) MCG/ACT inhaler INHALE 2 PUFFS BY MOUTH EVERY 6 HOURS AS NEEDED FOR WHEEZING OR SHORTNESS OF BREATH (Patient not taking: Reported on 01/11/2016)   . Respiratory Therapy Supplies (NEBULIZER) DEVI Use as directed four times daily (Patient not taking: Reported on 01/11/2016)    No facility-administered encounter medications on file as of 01/11/2016.    Functional  Status:   In your present state of health, do you have any difficulty performing the following activities: 12/08/2015 11/30/2015  Hearing? N N  Vision? N Y  Difficulty concentrating or making decisions? N Y  Walking or climbing stairs? N Y  Dressing or bathing? N N  Doing errands, shopping? N Y    Fall/Depression Screening:    PHQ 2/9 Scores 11/24/2015 09/21/2015 06/14/2015  PHQ - 2 Score 6 0 0  PHQ- 9 Score 12 - -    Assessment:    Member denies self-monitoring his blood sugar, weights, and blood pressures over the past month, stating that he has been focusing on taking care of his wife, who has been in the hospital twice in the past month.  She is discharged now and he is in the process of obtaining assistance to care for her.  He report he has been taking his medications with the exception on Amlodipine, stating he didn't think he needed it.  Advised on importance of taking all medications.  He state he will restart today.  He verbalizes understanding of self monitoring, stating that he will also need monitor his wife, which gives him motivation.  He reports that he continues to smoke, but state he has cut back to 1 pk/day, used to smoke close to 2 pks/day.  Along with leg swelling, abdomen noted to be distended and tight.  He state that he does not feel that his Lasix is working, but he has not adhered to his fluid restriction.  Discussed diet (does not adhere to low salt diet) and fluid restrictions, he verbalizes understanding.  Advised to contact PCP and report continued swelling.  He is reluctant.  Agrees to have this care manager follow up next week, if no improvement, states he will be open to appointment.  He denies any further concerns at this time.  Encouraged to contact with questions.  Plan:   Will follow up next week, if no improvement with swelling, will assist with scheduling appointment with PCP and/or cardiologist. Will schedule next month's home visit during call next  week.  THN CM Care Plan Problem One        Most Recent Value   Care Plan Problem One  Elevated blood pressure   Role Documenting the Problem One  Care Management Coordinator   Care Plan for Problem One  Active   THN Long Term Goal (31-90 days)  Member's blood pressure will consistently be within target range within the next 45 days   THN Long Term Goal Start Date  01/11/16 [Goal not met, not taking meds consistently.  Date reset]   Interventions for Problem One Long Term Goal  Discussed complications of elevated blood pressure, including stroke and heart attack.   THN CM Short Term Goal #1 (0-30 days)  Member will report taking all medications  as prescribed over the next 4 weeks   THN CM Short Term Goal #1 Start Date  01/11/16   Interventions for Short Term Goal #1  Re-eduated on importance of taking medications as directed in effort to control blood pressure and decrease risk of additional strokes.  Discussed importance of taking all medications, including Norvasc   THN CM Short Term Goal #2 (0-30 days)  Member will attend cardiology appointment this week   THN CM Short Term Goal #2 Start Date  11/16/15   THN CM Short Term Goal #2 Met Date  -- [Goal not met]   Interventions for Short Term Goal #2  Appointment confirmed for tomorrow, transportation confirmed.  Discussed the importance of following up with physician to assess current symptoms (numbness on left side, swelling in legs)    THN CM Care Plan Problem Two        Most Recent Value   Care Plan Problem Two  Leg swelling   Role Documenting the Problem Two  Care Management [place] for Problem Two  Active   THN CM Short Term Goal #1 (0-30 days)  Member will report decrease in leg swelling over the next 4 weeks   THN CM Short Term Goal #1 Start Date  01/11/16 [Goal not met, not following instructions for restrictions]   Interventions for Short Term Goal #2   Re-educated on the importance of taking diuretic as prescribed,  following fluid restriction orders, and wearing compression stockings     [name], RN, MSN [place] Manager [phone]

## 2016-01-13 ENCOUNTER — Other Ambulatory Visit: Payer: Self-pay | Admitting: *Deleted

## 2016-01-13 ENCOUNTER — Other Ambulatory Visit: Payer: Self-pay | Admitting: Internal Medicine

## 2016-01-13 MED ORDER — TIZANIDINE HCL 4 MG PO TABS: ORAL_TABLET | ORAL | Status: DC

## 2016-01-13 NOTE — Telephone Encounter (Signed)
Left msg on triage requesting renewal on Tizanidine...Johny Chess

## 2016-01-17 ENCOUNTER — Ambulatory Visit: Payer: Medicare Other

## 2016-01-18 ENCOUNTER — Ambulatory Visit (INDEPENDENT_AMBULATORY_CARE_PROVIDER_SITE_OTHER): Payer: Medicare Other | Admitting: Pharmacist Clinician (PhC)/ Clinical Pharmacy Specialist

## 2016-01-18 ENCOUNTER — Ambulatory Visit: Payer: Self-pay

## 2016-01-18 ENCOUNTER — Ambulatory Visit (INDEPENDENT_AMBULATORY_CARE_PROVIDER_SITE_OTHER): Payer: Medicare Other | Admitting: *Deleted

## 2016-01-18 DIAGNOSIS — Z23 Encounter for immunization: Secondary | ICD-10-CM

## 2016-01-18 DIAGNOSIS — B182 Chronic viral hepatitis C: Secondary | ICD-10-CM

## 2016-01-18 NOTE — Progress Notes (Signed)
HPI: Brad [name] is a 60 y.o. male who presents to the pharmacy clinic today for follow-up of his Hep C infection.  No results found for: HCVGENOTYPE, HEPCGENOTYPE  Allergies: Allergies  Allergen Reactions  . Doxycycline Hives and Itching  . Lipitor [Atorvastatin] Other (See Comments)    Myalgia   . Oxycodone Itching    Past Medical History: Past Medical History  Diagnosis Date  . Depression   . GERD (gastroesophageal reflux disease)   . Hyperlipidemia 08/27/2014    takes Simvastatin daily  . Hypertension     takes Amlodipine and Lisinopril daily  . Hypothyroidism 08/27/2014    takes Synthroid daily  . Anemia     takes Ferrous Sulfated daily  . Carotid stenosis 08/27/2014    takes Aspirin daily  . Diabetes mellitus type II     takes Metformin and Glipizide daily  . Muscle spasm     takes Zanaflex daily as needed  . Insomnia     takes Pamelor nightly  . COPD (chronic obstructive pulmonary disease) (HCC)     Albuterol daily as needed and Spiriva daily  . MVP (mitral valve prolapse)   . History of blood clots     in right leg;being followed by Dr.Brabham for this  . Shortness of breath dyspnea   . Peripheral neuropathy ([place])   . Headache     occasionally  . Dizziness   . Stroke ([place])   . Arthritis   . Joint pain     hips  . Peripheral edema     takes Furosemide daily as needed  . History of hiatal hernia   . History of gastric ulcer   . Urinary frequency   . Urinary urgency   . History of blood transfusion 09/2014    2 units-no abnormal reaction noted  . HTN (hypertension) 09/14/2015  . Chronic hepatitis C ([place]) 09/17/2015  . Prurigo nodularis   . GI bleed 12/2015    Social History: Social History   Social History  . Marital Status: Single    Spouse Name: N/A  . Number of Children: 0  . Years of Education: N/A   Occupational History  . Disabled    Social History Main Topics  . Smoking status: Current Every Day Smoker -- 1.00 packs/day for 48  years    Types: Cigarettes  . Smokeless tobacco: Never Used     Comment: 1-2 pks per day  . Alcohol Use: 0.6 oz/week    1 Cans of beer per week     Comment: rarely  . Drug Use: No  . Sexual Activity: Yes   Other Topics Concern  . Not on file   Social History Narrative   Divorced with fiance.   Disabled since June 2010 - right shoulder   No children.          Labs: HEP B S AB (no units)  Date Value  11/09/2015 NEG   HEPATITIS B SURFACE AG (no units)  Date Value  11/09/2015 NEGATIVE   HCV AB (no units)  Date Value  09/14/2015 REACTIVE*    No results found for: HCVGENOTYPE, HEPCGENOTYPE  Hepatitis C RNA quantitative Latest Ref Rng 09/14/2015  HCV Quantitative <15 IU/mL 1701236(H)  HCV Quantitative Log <1.18 log 10 6.23(H)    AST (U/L)  Date Value  12/09/2015 19  12/08/2015 20  12/07/2015 19   ALT (U/L)  Date Value  12/09/2015 12*  12/08/2015 13*  12/07/2015 10   INR (no units)  Date Value  12/08/2015 1.19  09/27/2015 1.11  11/13/2014 1.09    CrCl: CrCl cannot be calculated (Patient has no serum creatinine result on file.).  Fibrosis Score: F2/F3 as assessed by elastography   Previous Treatment Regimen: None  Assessment: Brad [name] presents today for follow-up of his Hep C infection.  He started Harvoni around May 23.  He states he has had no problems tolerating Harvoni - no headaches, no diarrhea, no nausea.  He is having significant swelling in his legs and feet - he takes Lasix 80 mg BID. He states he stopped taking Protonix and is not taking it while on Harvoni. He also states he stopped taking most of his medications except his Norvasc, Synthroid, Iron, and potassium.  I encouraged him to take his metformin until he follows up with his PCP.  He has not missed any doses of Harvoni so far - he always remembers to take it.  We scheduled him for labs next week. We also gave him his second Hep B vaccine.   Recommendations: Continue  Harvoni Follow-up for labs on 01/25/16 at 9:15 am Follow-up appointment with Dr. Linus Salmons on 03/09/16 at 9:45am  Jayvian Escoe L. Nicole Kindred, PharmD PGY2 Infectious Diseases Pharmacy Resident Pager: [phone] 01/18/2016 9:51 AM

## 2016-01-18 NOTE — Patient Instructions (Addendum)
Continue to take Harvoni x 3 mo Separate out the iron Come back for lab in 1 week Follow up with Dr. Linus Salmons in August

## 2016-01-19 ENCOUNTER — Other Ambulatory Visit: Payer: Self-pay | Admitting: *Deleted

## 2016-01-19 ENCOUNTER — Ambulatory Visit (INDEPENDENT_AMBULATORY_CARE_PROVIDER_SITE_OTHER): Payer: Medicare Other | Admitting: Internal Medicine

## 2016-01-19 ENCOUNTER — Ambulatory Visit: Payer: Medicare Other | Admitting: Neurology

## 2016-01-19 ENCOUNTER — Encounter: Payer: Self-pay | Admitting: Internal Medicine

## 2016-01-19 VITALS — BP 160/84 | HR 84 | Resp 20 | Wt 276.0 lb

## 2016-01-19 DIAGNOSIS — I5032 Chronic diastolic (congestive) heart failure: Secondary | ICD-10-CM | POA: Diagnosis not present

## 2016-01-19 DIAGNOSIS — J438 Other emphysema: Secondary | ICD-10-CM | POA: Diagnosis not present

## 2016-01-19 DIAGNOSIS — Z720 Tobacco use: Secondary | ICD-10-CM | POA: Diagnosis not present

## 2016-01-19 DIAGNOSIS — I70219 Atherosclerosis of native arteries of extremities with intermittent claudication, unspecified extremity: Secondary | ICD-10-CM

## 2016-01-19 DIAGNOSIS — I639 Cerebral infarction, unspecified: Secondary | ICD-10-CM | POA: Diagnosis not present

## 2016-01-19 DIAGNOSIS — I739 Peripheral vascular disease, unspecified: Secondary | ICD-10-CM | POA: Diagnosis not present

## 2016-01-19 DIAGNOSIS — F17200 Nicotine dependence, unspecified, uncomplicated: Secondary | ICD-10-CM

## 2016-01-19 MED ORDER — METOLAZONE 5 MG PO TABS: 5.0000 mg | ORAL_TABLET | Freq: Every day | ORAL | Status: DC

## 2016-01-19 NOTE — Progress Notes (Signed)
Subjective:    Patient ID: Brad [name], male    DOB: 1955/11/11, 60 y.o.   MRN: [id number]  HPI  Here to f/u; overall doing ok,  Pt denies chest pain, increasing sob or doe, wheezing, orthopnea, PND, palpitations, dizziness or syncope except for worsening leg swelling and wt increase. Chews ice quite a bit daily. Wt Readings from Last 3 Encounters:  01/19/16 276 lb (125.193 kg)  01/10/16 284 lb 1.6 oz (128.867 kg)  12/21/15 278 lb (126.1 kg)  Pt denies new neurological symptoms such as new headache, or facial or extremity weakness or numbness.  Pt denies polydipsia, polyuria, or low sugar episode.   Pt denies new neurological symptoms such as new headache, or facial or extremity weakness or numbness.   Pt states overall good compliance with meds, mostly trying to follow appropriate diet, no overt bleeding, Does not take iron every day.  Still smoking, also with incidental worsening bilat LE pain to calves at only 200 ft reproducibly.  Has known PAD, no recent f/u.  No back pain or radicular pain Past Medical History  Diagnosis Date  . Depression   . GERD (gastroesophageal reflux disease)   . Hyperlipidemia 08/27/2014    takes Simvastatin daily  . Hypertension     takes Amlodipine and Lisinopril daily  . Hypothyroidism 08/27/2014    takes Synthroid daily  . Anemia     takes Ferrous Sulfated daily  . Carotid stenosis 08/27/2014    takes Aspirin daily  . Diabetes mellitus type II     takes Metformin and Glipizide daily  . Muscle spasm     takes Zanaflex daily as needed  . Insomnia     takes Pamelor nightly  . COPD (chronic obstructive pulmonary disease) (HCC)     Albuterol daily as needed and Spiriva daily  . MVP (mitral valve prolapse)   . History of blood clots     in right leg;being followed by Dr.Brabham for this  . Shortness of breath dyspnea   . Peripheral neuropathy ([place])   . Headache     occasionally  . Dizziness   . Stroke ([place])   . Arthritis   . Joint pain    hips  . Peripheral edema     takes Furosemide daily as needed  . History of hiatal hernia   . History of gastric ulcer   . Urinary frequency   . Urinary urgency   . History of blood transfusion 09/2014    2 units-no abnormal reaction noted  . HTN (hypertension) 09/14/2015  . Chronic hepatitis C ([place]) 09/17/2015  . Prurigo nodularis   . GI bleed 12/2015   Past Surgical History  Procedure Laterality Date  . Neg stress test  2000  . Rotator cuff repair Right 2009    multiple f/u Sxs/infection Tx  . Incision and drainage perirectal abscess  03/2009  . Esophagogastroduodenoscopy N/A 09/24/2014    Procedure: ESOPHAGOGASTRODUODENOSCOPY (EGD);  Surgeon: Winfield Cunas., MD;  Location: [facility] ENDOSCOPY;  Service: Endoscopy;  Laterality: N/A;  . I&d of right arm    . Endarterectomy Left 11/13/2014    Procedure: ENDARTERECTOMY CAROTID WITH PATCH ANGIOPLASTY;  Surgeon: Serafina Mitchell, MD;  Location: [place];  Service: Vascular;  Laterality: Left;  . Peripheral vascular catheterization N/A 11/30/2015    Procedure: Carotid PTA/Stent Intervention;  Surgeon: Serafina Mitchell, MD;  Location: [place] [facility];  Service: Cardiovascular;  Laterality: N/A;    reports that he has been  smoking Cigarettes.  He has a 48 pack-year smoking history. He has never used smokeless tobacco. He reports that he drinks about 0.6 oz of alcohol per week. He reports that he does not use illicit drugs. family history includes Alcohol abuse in his father; Diabetes type II in his mother; Heart disease in his mother; Lung cancer in his father, maternal grandmother, and sister. Allergies  Allergen Reactions  . Doxycycline Hives and Itching  . Lipitor [Atorvastatin] Other (See Comments)    Myalgia   . Oxycodone Itching   Current Outpatient Prescriptions on File Prior to Visit  Medication Sig Dispense Refill  . amLODipine (NORVASC) 5 MG tablet Take 1 tablet (5 mg total) by mouth daily. 90 tablet 3  . aspirin EC 81 MG tablet Take  1 tablet (81 mg total) by mouth daily. 90 tablet 11  . Blood Pressure Monitor DEVI Use to check Blood pressure daily 1 Device 0  . ferrous sulfate 325 (65 FE) MG tablet Take 1 tablet (325 mg total) by mouth 3 (three) times daily with meals. 30 tablet 3  . furosemide (LASIX) 80 MG tablet Take 1 tablet (80 mg total) by mouth 2 (two) times daily. 60 tablet 11  . glucose blood (ACCU-CHEK AVIVA PLUS) test strip Use to check blood sugars daily Dx E11.9 100 each 3  . ipratropium (ATROVENT HFA) 17 MCG/ACT inhaler Inhale 2 puffs into the lungs 4 (four) times daily. 3 Inhaler 3  . Lancets MISC 1 each by Other route See admin instructions. Reported on 09/07/2015    . Ledipasvir-Sofosbuvir (HARVONI) 90-400 MG TABS Take 1 tablet by mouth daily. 28 tablet 2  . levothyroxine (SYNTHROID, LEVOTHROID) 112 MCG tablet Take 1 tablet (112 mcg total) by mouth daily. 90 tablet 3  . lisinopril (PRINIVIL,ZESTRIL) 20 MG tablet TAKE 1 TABLET(20 MG) BY MOUTH DAILY 90 tablet 0  . metFORMIN (GLUCOPHAGE-XR) 500 MG 24 hr tablet Take 2 tablets (1,000 mg total) by mouth daily with breakfast. 270 tablet 3  . metoprolol succinate (TOPROL-XL) 50 MG 24 hr tablet Take 1 tablet (50 mg total) by mouth daily. Take with or immediately following a meal. 90 tablet 3  . pantoprazole (PROTONIX) 40 MG tablet Take 1 tablet (40 mg total) by mouth 2 (two) times daily. 60 tablet 0  . potassium chloride (K-DUR) 10 MEQ tablet 2 tabs by mouth per day in the AM 180 tablet 3  . PROAIR HFA 108 (90 BASE) MCG/ACT inhaler INHALE 2 PUFFS BY MOUTH EVERY 6 HOURS AS NEEDED FOR WHEEZING OR SHORTNESS OF BREATH 8.5 g 11  . Respiratory Therapy Supplies (NEBULIZER) DEVI Use as directed four times daily 1 each 0  . simvastatin (ZOCOR) 40 MG tablet TAKE 1 TABLET(40 MG) BY MOUTH AT BEDTIME 90 tablet 0  . tiZANidine (ZANAFLEX) 4 MG tablet TAKE 1 TABLET BY MOUTH EVERY 6 HOURS AS NEEDED FOR MUSCLE SPASMS 60 tablet 0  . traMADol (ULTRAM) 50 MG tablet TAKE 1-2 TABLETS BY  MOUTH UP TO FOUR TIMES DAILY 120 tablet 5   No current facility-administered medications on file prior to visit.   Review of Systems  Constitutional: Negative for unusual diaphoresis or night sweats HENT: Negative for ear swelling or discharge Eyes: Negative for worsening visual haziness  Respiratory: Negative for choking and stridor.   Gastrointestinal: Negative for distension or worsening eructation Genitourinary: Negative for retention or change in urine volume.  Musculoskeletal: Negative for other MSK pain or swelling Skin: Negative for color change and worsening wound Neurological:  Negative for tremors and numbness other than noted  Psychiatric/Behavioral: Negative for decreased concentration or agitation other than above       Objective:   Physical Exam BP 160/84 mmHg  Pulse 84  Resp 20  Wt 276 lb (125.193 kg)  SpO2 96% VS noted,  Constitutional: Pt appears in no apparent distress HENT: Head: NCAT.  Right Ear: External ear normal.  Left Ear: External ear normal.  Eyes: . Pupils are equal, round, and reactive to light. Conjunctivae and EOM are normal Neck: Normal range of motion. Neck supple.  Cardiovascular: Normal rate and regular rhythm.   Pulmonary/Chest: Effort normal and breath sounds without rales or wheezing.  Abd:  Soft, NT, ND, + BS Neurological: Pt is alert. Not confused , motor grossly intact Skin: Skin is warm. No rash, 2+ LE edema, dorsalis pedis trace bilat Psychiatric: Pt behavior is normal. No agitation.   Lab Results  Component Value Date   WBC 6.9 12/21/2015   HGB 7.8 Repeated and verified X2.* 12/21/2015   HCT 24.8* 12/21/2015   PLT 328.0 12/21/2015   GLUCOSE 180* 12/21/2015   CHOL 134 12/07/2015   TRIG 146.0 12/07/2015   HDL 24.90* 12/07/2015   LDLDIRECT 92.0 06/09/2015   LDLCALC 80 12/07/2015   ALT 12* 12/09/2015   AST 19 12/09/2015   NA 140 12/21/2015   K 3.1* 12/21/2015   CL 101 12/21/2015   CREATININE 1.02 12/21/2015   BUN 18  12/21/2015   CO2 31 12/21/2015   TSH 4.70* 12/07/2015   PSA 0.71 12/07/2015   INR 1.19 12/08/2015   HGBA1C 5.7 12/07/2015   MICROALBUR 437.5 Repeated and verified X2.* 06/09/2015   CLINICAL DATA: Shortness of breath and anemia  EXAM: CHEST 2 VIEW  COMPARISON: November 21, 2014  FINDINGS: There is mild scarring in the left upper lobe and apex regions. There is no edema or consolidation. The heart size and pulmonary vascularity are normal. No adenopathy. There is degenerative change in the thoracic spine.  IMPRESSION: Mild scarring left upper lobe/ apex regions. No edema or consolidation.   Electronically Signed  By: Lowella Grip III M.D.  On: 12/08/2015 07:34  Result status: Final result      *[facility]*  [address], [place] [address]  [phone]  ------------------------------------------------------------------- Transthoracic Echocardiography  Patient: Brad [name], Brad [name] MR #: [id number] Study Date: 09/29/2015 Gender: M Age: 35 Height: 180.3 cm Weight: 119.7 kg BSA: 2.49 m^2 Pt. Status: Room: 5M19C  ATTENDING [place], [place], [place] SONOGRAPHER [address] [place] [id number] [place] [phone] PERFORMING [facility], Inpatient  cc:  ------------------------------------------------------------------- LV EF: 45% - 50%  ------------------------------------------------------------------- Indications: CVA 3.  ------------------------------------------------------------------- History: PMH: Anemia. Stroke. Risk factors: Current tobacco use. Hypertension. Diabetes mellitus.  Dyslipidemia.  ------------------------------------------------------------------- Study Conclusions  - Left ventricle: The apex is not adequately visualized to rule out  apical thrombus. The cavity size was normal. There was mild  concentric hypertrophy. Systolic function was mildly reduced. The  estimated ejection fraction was in the range of 45% to 50%.  Images were inadequate for LV wall motion assessment. Features  are consistent with a pseudonormal left ventricular filling  pattern, with concomitant abnormal relaxation and increased  filling pressure (grade 2 diastolic dysfunction). Doppler  parameters are consistent with high ventricular filling pressure. - Aortic valve: Trileaflet; normal thickness, mildly calcified  leaflets. - Mitral valve: Calcified annulus. There was trivial regurgitation. - Left atrium: The atrium  was mildly dilated. - Recommendations: Recommend definity contrast study to rule out  apical thrombus. Consider transesophageal echocardiography if  clinically indicated in order to exclude intracardiac thrombus.  Impressions:  - No cardiac source of emboli was indentified.  Recommendations:  1. Recommend definity contrast study to rule out apical thrombus. 2. Consider transesophageal echocardiography if clinically  indicated in order to exclude intracardiac thrombus.       Assessment & Plan:

## 2016-01-19 NOTE — Patient Outreach (Signed)
Call placed to member to follow up on leg swelling, no answer, unable to leave a message.  Noted in chart that member has appointment today with [name] to address concerns with legs.  Will follow up with member within the next 2 weeks.  [name], [place], MSN [place] [phone]

## 2016-01-19 NOTE — Progress Notes (Signed)
Pre visit review using our clinic review tool, if applicable. No additional management support is needed unless otherwise documented below in the visit note. 

## 2016-01-19 NOTE — Patient Instructions (Addendum)
Please take all new medication as prescribed  - the metolazone 5 mg per day in the AM  Please check your weight every day in the AM, and bring with you to the next visit  Please continue all other medications as before, and refills have been done if requested.  Please have the pharmacy call with any other refills you may need.  Please keep your appointments with your specialists as you may have planned  You will be contacted regarding the referral for: Leg Arterial Dopplers, and Vascular surgury  Please stop smoking  Please return in 2 weeks, or sooner if needed

## 2016-01-20 MED FILL — *HARVONI 90-400 MG TABLET: 90-400 | 28 days supply | Qty: 28 | Fill #1

## 2016-01-23 DIAGNOSIS — F17200 Nicotine dependence, unspecified, uncomplicated: Secondary | ICD-10-CM | POA: Insufficient documentation

## 2016-01-23 NOTE — Assessment & Plan Note (Signed)
Hx suspicious for worsening symptomatic PAD, for vasc surgury referral, LE arterial dopplers

## 2016-01-23 NOTE — Assessment & Plan Note (Signed)
Mild to mod, for add metolazoen 5 qam,cont all other meds  to f/u any worsening symptoms or concerns, ROV 2 wks

## 2016-01-23 NOTE — Assessment & Plan Note (Signed)
stable overall by history and exam, recent data reviewed with pt, and pt to continue medical treatment as before,  to f/u any worsening symptoms or concerns SpO2 Readings from Last 3 Encounters:  01/19/16 96%  01/11/16 98%  01/10/16 99%

## 2016-01-23 NOTE — Assessment & Plan Note (Signed)
D/w pt, urged to quit, not yet motivated

## 2016-01-24 ENCOUNTER — Other Ambulatory Visit: Payer: Self-pay | Admitting: *Deleted

## 2016-01-24 DIAGNOSIS — I739 Peripheral vascular disease, unspecified: Secondary | ICD-10-CM

## 2016-01-25 ENCOUNTER — Other Ambulatory Visit: Payer: Medicare Other

## 2016-01-25 ENCOUNTER — Telehealth: Payer: Self-pay | Admitting: Internal Medicine

## 2016-01-25 DIAGNOSIS — B1710 Acute hepatitis C without hepatic coma: Secondary | ICD-10-CM

## 2016-01-25 LAB — COMPLETE METABOLIC PANEL WITH GFR
ALT: 11 U/L (ref 9–46)
AST: 20 U/L (ref 10–35)
Albumin: 2.9 g/dL — ABNORMAL LOW (ref 3.6–5.1)
Alkaline Phosphatase: 137 U/L — ABNORMAL HIGH (ref 40–115)
BUN: 19 mg/dL (ref 7–25)
CO2: 38 mmol/L — ABNORMAL HIGH (ref 20–31)
Calcium: 8.6 mg/dL (ref 8.6–10.3)
Chloride: 90 mmol/L — ABNORMAL LOW (ref 98–110)
Creat: 1.22 mg/dL (ref 0.70–1.33)
GFR, Est African American: 75 mL/min (ref >=60)
GFR, Est Non African American: 65 mL/min (ref >=60)
Glucose, Bld: 157 mg/dL — ABNORMAL HIGH (ref 65–99)
Potassium: 3.9 mmol/L (ref 3.5–5.3)
Sodium: 136 mmol/L (ref 135–146)
Total Bilirubin: 0.4 mg/dL (ref 0.2–1.2)
Total Protein: 6.6 g/dL (ref 6.1–8.1)

## 2016-01-25 NOTE — Telephone Encounter (Signed)
FYI:  States pill prescribed for legs seems to be working but does make him dizzy.

## 2016-01-26 LAB — HEPATITIS C RNA QUANTITATIVE: HCV Quantitative: NOT DETECTED IU/mL (ref <15)

## 2016-01-26 NOTE — Telephone Encounter (Signed)
Ok to cut back to metolazone 2.5 mg (half of the 5 mg pill) in the AM PRN persistent swelling only

## 2016-01-26 NOTE — Telephone Encounter (Signed)
Please advise, is this ok? 

## 2016-01-27 NOTE — Telephone Encounter (Signed)
Spoke to patient, he is aware. 

## 2016-01-28 ENCOUNTER — Encounter: Payer: Self-pay | Admitting: Surgery

## 2016-02-02 ENCOUNTER — Ambulatory Visit (INDEPENDENT_AMBULATORY_CARE_PROVIDER_SITE_OTHER): Payer: Medicare Other | Admitting: Internal Medicine

## 2016-02-02 ENCOUNTER — Encounter: Payer: Self-pay | Admitting: Internal Medicine

## 2016-02-02 ENCOUNTER — Other Ambulatory Visit (INDEPENDENT_AMBULATORY_CARE_PROVIDER_SITE_OTHER): Payer: Medicare Other

## 2016-02-02 VITALS — BP 140/80 | HR 72 | Temp 98.1°F | Resp 20 | Wt 255.0 lb

## 2016-02-02 DIAGNOSIS — G8929 Other chronic pain: Secondary | ICD-10-CM

## 2016-02-02 DIAGNOSIS — M545 Low back pain, unspecified: Secondary | ICD-10-CM

## 2016-02-02 DIAGNOSIS — I639 Cerebral infarction, unspecified: Secondary | ICD-10-CM | POA: Diagnosis not present

## 2016-02-02 DIAGNOSIS — I5032 Chronic diastolic (congestive) heart failure: Secondary | ICD-10-CM | POA: Diagnosis not present

## 2016-02-02 DIAGNOSIS — D649 Anemia, unspecified: Secondary | ICD-10-CM

## 2016-02-02 DIAGNOSIS — E1159 Type 2 diabetes mellitus with other circulatory complications: Secondary | ICD-10-CM | POA: Diagnosis not present

## 2016-02-02 LAB — CBC WITH DIFFERENTIAL/PLATELET
Basophils Absolute: 0.1 10*3/uL (ref 0.0–0.1)
Basophils Relative: 0.9 % (ref 0.0–3.0)
Eosinophils Absolute: 0.2 10*3/uL (ref 0.0–0.7)
Eosinophils Relative: 2 % (ref 0.0–5.0)
HCT: 28.3 % — ABNORMAL LOW (ref 39.0–52.0)
Hemoglobin: 9 g/dL — ABNORMAL LOW (ref 13.0–17.0)
Lymphocytes Relative: 15.5 % (ref 12.0–46.0)
Lymphs Abs: 1.3 10*3/uL (ref 0.7–4.0)
MCHC: 31.8 g/dL (ref 30.0–36.0)
MCV: 67.8 fl — ABNORMAL LOW (ref 78.0–100.0)
Monocytes Absolute: 0.7 10*3/uL (ref 0.1–1.0)
Monocytes Relative: 8.5 % (ref 3.0–12.0)
Neutro Abs: 6 10*3/uL (ref 1.4–7.7)
Neutrophils Relative %: 73.1 % (ref 43.0–77.0)
Platelets: 377 10*3/uL (ref 150.0–400.0)
RBC: 4.17 Mil/uL — ABNORMAL LOW (ref 4.22–5.81)
RDW: 27.2 % — ABNORMAL HIGH (ref 11.5–15.5)
WBC: 8.2 10*3/uL (ref 4.0–10.5)

## 2016-02-02 LAB — BASIC METABOLIC PANEL
BUN: 23 mg/dL (ref 6–23)
CO2: 30 mEq/L (ref 19–32)
Calcium: 9.1 mg/dL (ref 8.4–10.5)
Chloride: 97 mEq/L (ref 96–112)
Creatinine, Ser: 1.06 mg/dL (ref 0.40–1.50)
GFR: 76.21 mL/min (ref >60.00)
Glucose, Bld: 173 mg/dL — ABNORMAL HIGH (ref 70–99)
Potassium: 4.3 mEq/L (ref 3.5–5.1)
Sodium: 131 mEq/L — ABNORMAL LOW (ref 135–145)

## 2016-02-02 MED ORDER — GABAPENTIN 600 MG PO TABS: 600.0000 mg | ORAL_TABLET | Freq: Three times a day (TID) | ORAL | Status: DC

## 2016-02-02 MED ORDER — FUROSEMIDE 80 MG PO TABS: 80.0000 mg | ORAL_TABLET | Freq: Every day | ORAL | Status: DC

## 2016-02-02 NOTE — Progress Notes (Signed)
Pre visit review using our clinic review tool, if applicable. No additional management support is needed unless otherwise documented below in the visit note. 

## 2016-02-02 NOTE — Progress Notes (Signed)
Subjective:    Patient ID: Brad [name], male    DOB: [birth year], 58 y.o.   MRN: [id number]  HPI  Here to f/u; overall doing ok,  Pt denies chest pain, increasing sob or doe, wheezing, orthopnea, PND, increased LE swelling, palpitations, dizziness or syncope.  Pt denies new neurological symptoms such as new headache, or facial or extremity weakness or numbness.  Pt denies polydipsia, polyuria, or low sugar episode.   Pt denies new neurological symptoms such as new headache, or facial or extremity weakness or numbness.   Pt states overall good compliance with meds, mostly trying to follow appropriate diet, with wt overall down with diuresis nicely, and LE edema resolved.   Wt Readings from Last 3 Encounters:  02/02/16 255 lb (115.667 kg)  02/09/2016 276 lb (125.193 kg)  01/10/16 284 lb 1.6 oz (128.867 kg)   Lab Results  Component Value Date   CREATININE 1.22 01/25/2016  Denies worsening reflux, abd pain, dysphagia, n/v, bowel change or blood, no overt bleeding.  Had one leg cramp last pm.  But overall tolerated increased diuresis  Wants to d/c tramadol in favor 600 mg tid gabapentin, as this has helped before; Pt continues to have recurring LBP without change in severity, bowel or bladder change, fever, wt loss,  worsening LE pain/numbness/weakness, gait change or falls.  Gabapentin seems to cam him down as well, and likes this  Mother died 02/09/23 with COPD; Pt sad/grieving, but Denies worsening depressive symptoms, suicidal ideation, or panic;   Past Medical History  Diagnosis Date  . Depression   . GERD (gastroesophageal reflux disease)   . Hyperlipidemia 08/27/2014    takes Simvastatin daily  . Hypertension     takes Amlodipine and Lisinopril daily  . Hypothyroidism 08/27/2014    takes Synthroid daily  . Anemia     takes Ferrous Sulfated daily  . Carotid stenosis 08/27/2014    takes Aspirin daily  . Diabetes mellitus type II     takes Metformin and Glipizide daily  . Muscle  spasm     takes Zanaflex daily as needed  . Insomnia     takes Pamelor nightly  . COPD (chronic obstructive pulmonary disease) (HCC)     Albuterol daily as needed and Spiriva daily  . MVP (mitral valve prolapse)   . History of blood clots     in right leg;being followed by Dr.Brabham for this  . Shortness of breath dyspnea   . Peripheral neuropathy ([place])   . Headache     occasionally  . Dizziness   . Stroke ([place])   . Arthritis   . Joint pain     hips  . Peripheral edema     takes Furosemide daily as needed  . History of hiatal hernia   . History of gastric ulcer   . Urinary frequency   . Urinary urgency   . History of blood transfusion 09/2014    2 units-no abnormal reaction noted  . HTN (hypertension) 09/14/2015  . Chronic hepatitis C ([place]) 09/17/2015  . Prurigo nodularis   . GI bleed 12/2015   Past Surgical History  Procedure Laterality Date  . Neg stress test  2000  . Rotator cuff repair Right 2009    multiple f/u Sxs/infection Tx  . Incision and drainage perirectal abscess  03/2009  . Esophagogastroduodenoscopy N/A 09/24/2014    Procedure: ESOPHAGOGASTRODUODENOSCOPY (EGD);  Surgeon: Winfield Cunas., MD;  Location: [facility] ENDOSCOPY;  Service: Endoscopy;  Laterality: N/A;  .  I&d of right arm    . Endarterectomy Left 11/13/2014    Procedure: ENDARTERECTOMY CAROTID WITH PATCH ANGIOPLASTY;  Surgeon: Serafina Mitchell, MD;  Location: [place];  Service: Vascular;  Laterality: Left;  . Peripheral vascular catheterization N/A 11/30/2015    Procedure: Carotid PTA/Stent Intervention;  Surgeon: Serafina Mitchell, MD;  Location: [place] [facility];  Service: Cardiovascular;  Laterality: N/A;    reports that he has been smoking Cigarettes.  He has a 48 pack-year smoking history. He has never used smokeless tobacco. He reports that he drinks about 0.6 oz of alcohol per week. He reports that he does not use illicit drugs. family history includes Alcohol abuse in his father; Diabetes type II in his  mother; Heart disease in his mother; Lung cancer in his father, maternal grandmother, and sister. Allergies  Allergen Reactions  . Doxycycline Hives and Itching  . Lipitor [Atorvastatin] Other (See Comments)    Myalgia   . Oxycodone Itching   Current Outpatient Prescriptions on File Prior to Visit  Medication Sig Dispense Refill  . amLODipine (NORVASC) 5 MG tablet Take 1 tablet (5 mg total) by mouth daily. 90 tablet 3  . aspirin EC 81 MG tablet Take 1 tablet (81 mg total) by mouth daily. 90 tablet 11  . Blood Pressure Monitor DEVI Use to check Blood pressure daily 1 Device 0  . ferrous sulfate 325 (65 FE) MG tablet Take 1 tablet (325 mg total) by mouth 3 (three) times daily with meals. 30 tablet 3  . glucose blood (ACCU-CHEK AVIVA PLUS) test strip Use to check blood sugars daily Dx E11.9 100 each 3  . ipratropium (ATROVENT HFA) 17 MCG/ACT inhaler Inhale 2 puffs into the lungs 4 (four) times daily. 3 Inhaler 3  . Lancets MISC 1 each by Other route See admin instructions. Reported on 09/07/2015    . Ledipasvir-Sofosbuvir (HARVONI) 90-400 MG TABS Take 1 tablet by mouth daily. 28 tablet 2  . levothyroxine (SYNTHROID, LEVOTHROID) 112 MCG tablet Take 1 tablet (112 mcg total) by mouth daily. 90 tablet 3  . lisinopril (PRINIVIL,ZESTRIL) 20 MG tablet TAKE 1 TABLET(20 MG) BY MOUTH DAILY 90 tablet 0  . metFORMIN (GLUCOPHAGE-XR) 500 MG 24 hr tablet Take 2 tablets (1,000 mg total) by mouth daily with breakfast. 270 tablet 3  . metoprolol succinate (TOPROL-XL) 50 MG 24 hr tablet Take 1 tablet (50 mg total) by mouth daily. Take with or immediately following a meal. 90 tablet 3  . pantoprazole (PROTONIX) 40 MG tablet Take 1 tablet (40 mg total) by mouth 2 (two) times daily. 60 tablet 0  . potassium chloride (K-DUR) 10 MEQ tablet 2 tabs by mouth per day in the AM 180 tablet 3  . PROAIR HFA 108 (90 BASE) MCG/ACT inhaler INHALE 2 PUFFS BY MOUTH EVERY 6 HOURS AS NEEDED FOR WHEEZING OR SHORTNESS OF BREATH 8.5 g  11  . Respiratory Therapy Supplies (NEBULIZER) DEVI Use as directed four times daily 1 each 0  . simvastatin (ZOCOR) 40 MG tablet TAKE 1 TABLET(40 MG) BY MOUTH AT BEDTIME 90 tablet 0  . tiZANidine (ZANAFLEX) 4 MG tablet TAKE 1 TABLET BY MOUTH EVERY 6 HOURS AS NEEDED FOR MUSCLE SPASMS 60 tablet 0   No current facility-administered medications on file prior to visit.    Review of Systems  Constitutional: Negative for unusual diaphoresis or night sweats HENT: Negative for ear swelling or discharge Eyes: Negative for worsening visual haziness  Respiratory: Negative for choking and stridor.  Gastrointestinal: Negative for distension or worsening eructation Genitourinary: Negative for retention or change in urine volume.  Musculoskeletal: Negative for other MSK pain or swelling Skin: Negative for color change and worsening wound Neurological: Negative for tremors and numbness other than noted  Psychiatric/Behavioral: Negative for decreased concentration or agitation other than above       Objective:   Physical Exam BP 140/80 mmHg  Pulse 72  Temp(Src) 98.1 F (36.7 C) (Oral)  Resp 20  Wt 255 lb (115.667 kg)  SpO2 98% VS noted,  Constitutional: Pt appears in no apparent distress HENT: Head: NCAT.  Right Ear: External ear normal.  Left Ear: External ear normal.  Eyes: . Pupils are equal, round, and reactive to light. Conjunctivae and EOM are normal Neck: Normal range of motion. Neck supple.  Cardiovascular: Normal rate and regular rhythm.   Pulmonary/Chest: Effort normal and breath sounds without rales or wheezing.  Abd:  Soft, NT, ND, + BS Spine: diffuse lumbar tender low midline without swelling or erythema Neurological: Pt is alert. Not confused , motor grossly intact Skin: Skin is warm. No rash Psychiatric: Pt behavior is normal. No agitation. mild sad Trace ankle edema bilat only Lab Results  Component Value Date   WBC 6.9 12/21/2015   HGB 7.8 Repeated and verified X2.*  12/21/2015   HCT 24.8* 12/21/2015   PLT 328.0 12/21/2015   GLUCOSE 157* 01/25/2016   CHOL 134 12/07/2015   TRIG 146.0 12/07/2015   HDL 24.90* 12/07/2015   LDLDIRECT 92.0 06/09/2015   LDLCALC 80 12/07/2015   ALT 11 01/25/2016   AST 20 01/25/2016   NA 136 01/25/2016   K 3.9 01/25/2016   CL 90* 01/25/2016   CREATININE 1.22 01/25/2016   BUN 19 01/25/2016   CO2 38* 01/25/2016   TSH 4.70* 12/07/2015   PSA 0.71 12/07/2015   INR 1.19 12/08/2015   HGBA1C 5.7 12/07/2015   MICROALBUR 437.5 Repeated and verified X2.* 06/09/2015       Assessment & Plan:

## 2016-02-02 NOTE — Patient Instructions (Addendum)
Ok to stop the metolazone  Ok to decrease the lasix to 80 mg once in the AM  [facility] to stop the tramadol  Please take all new medication as prescribed- the gabapentin 600 mg  Please continue all other medications as before, and refills have been done if requested.  Please have the pharmacy call with any other refills you may need.  Please continue your efforts at being more active, low cholesterol diet, and weight control.  Please keep your appointments with your specialists as you may have planned  Please go to the LAB in the Basement (turn left off the elevator) for the tests to be done today  You will be contacted by phone if any changes need to be made immediately.  Otherwise, you will receive a letter about your results with an explanation, but please check with MyChart first.  Please remember to sign up for MyChart if you have not done so, as this will be important to you in the future with finding out test results, communicating by private email, and scheduling acute appointments online when needed.  Please return in 6 months, or sooner if needed

## 2016-02-04 ENCOUNTER — Encounter: Payer: Medicare Other | Admitting: Surgery

## 2016-02-04 ENCOUNTER — Ambulatory Visit (HOSPITAL_COMMUNITY): Payer: Medicare Other

## 2016-02-07 ENCOUNTER — Other Ambulatory Visit: Payer: Self-pay | Admitting: *Deleted

## 2016-02-07 NOTE — Assessment & Plan Note (Signed)
stable overall by history and exam, recent data reviewed with pt, and pt to continue medical treatment as before,  to f/u any worsening symptoms or concerns Lab Results  Component Value Date   WBC 8.2 02/02/2016   HGB 9.0* 02/02/2016   HCT 28.3* 02/02/2016   MCV 67.8* 02/02/2016   PLT 377.0 02/02/2016

## 2016-02-07 NOTE — Assessment & Plan Note (Signed)
With nicely resolved perpih edema and wt reduced, today for check BMP, as well as d/c metolazone, and decrease lasix to 80 qam only (from bid),  to f/u any worsening symptoms or concerns, to continue to monitor home wts.

## 2016-02-07 NOTE — Assessment & Plan Note (Signed)
stable overall by history and exam, recent data reviewed with pt, and pt to continue medical treatment as before,  to f/u any worsening symptoms or concerns Lab Results  Component Value Date   HGBA1C 5.7 12/07/2015

## 2016-02-07 NOTE — Assessment & Plan Note (Signed)
Uncontrolled, tramadol not working well, ok to try change to gabapentin 600 tid which has helped previously, pt warned of sedation with start high dose,  to f/u any worsening symptoms or concerns

## 2016-02-07 NOTE — Patient Outreach (Signed)
Call placed to member to follow up on current health status and to schedule home visit for this month.  Noted in chart that member has seen PCP twice in the past month for congestive heart failure.  No answer today, phone rings with busy signal multiple times (3 attempts made).  Will follow up within the next week.  [name], [place], MSN [place] [phone]

## 2016-02-09 ENCOUNTER — Other Ambulatory Visit: Payer: Self-pay | Admitting: *Deleted

## 2016-02-09 NOTE — Patient Outreach (Signed)
Call placed to follow up on member's current status.  He reports that he is "doing the best I can."  He state his fiance passed away on 01-21-23 and he has been dealing with her death.  He reports that he has stopped taking most of his medications since her death with the exception of gabapentin and his "fluid pill."  He state the swelling in his legs has resolved and it is easier for him to walk without pain.  He report that he has been provided information for grief counseling and state he is considering getting involved.  Routine home visit scheduled for next week.  [name], [place], MSN [place] [phone]

## 2016-02-14 ENCOUNTER — Other Ambulatory Visit: Payer: Self-pay | Admitting: *Deleted

## 2016-02-14 NOTE — Patient Outreach (Signed)
[facility]) Care Management   02/14/2016  Brad [name] [birth year] [id number]  Brad [name] is an 60 y.o. male  Subjective:   Brad [name] reports that he is doing "ok."  He state that he has now been able to walk without his legs hurting.  He reports decreased swelling with medication change.    Objective:   Review of Systems  Constitutional: Negative.   HENT: Negative.   Eyes: Negative.   Respiratory: Negative.   Cardiovascular: Positive for leg swelling.  Gastrointestinal: Negative.   Genitourinary: Negative.   Musculoskeletal: Positive for joint pain.  Skin: Negative.   Neurological: Negative.   Endo/Heme/Allergies: Negative.   Psychiatric/Behavioral: Negative.     Physical Exam  Constitutional: He is oriented to person, place, and time. He appears well-developed and well-nourished.  Neck: Normal range of motion.  Cardiovascular: Normal rate, regular rhythm and normal heart sounds.   Respiratory: Effort normal and breath sounds normal.  GI: Soft. Bowel sounds are normal.  Musculoskeletal: Normal range of motion.  Neurological: He is alert and oriented to person, place, and time.  Skin: Skin is warm and dry.    BP 158/74 mmHg  Pulse 64  Resp 18  Wt 261 lb (118.389 kg)  SpO2 98%   Encounter Medications:   Outpatient Encounter Prescriptions as of 02/14/2016  Medication Sig Note  . aspirin EC 81 MG tablet Take 1 tablet (81 mg total) by mouth daily.   . Blood Pressure Monitor DEVI Use to check Blood pressure daily 09/07/2015: Patient reported 09/07/2015 he does not have a BP cuff.   . ferrous sulfate 325 (65 FE) MG tablet Take 1 tablet (325 mg total) by mouth 3 (three) times daily with meals.   . furosemide (LASIX) 80 MG tablet Take 1 tablet (80 mg total) by mouth daily.   Marland Kitchen gabapentin (NEURONTIN) 600 MG tablet Take 1 tablet (600 mg total) by mouth 3 (three) times daily.   Marland Kitchen glucose blood (ACCU-CHEK AVIVA PLUS) test strip Use to check blood  sugars daily Dx E11.9   . ipratropium (ATROVENT HFA) 17 MCG/ACT inhaler Inhale 2 puffs into the lungs 4 (four) times daily.   . Lancets MISC 1 each by Other route See admin instructions. Reported on 09/07/2015   . Ledipasvir-Sofosbuvir (HARVONI) 90-400 MG TABS Take 1 tablet by mouth daily.   Marland Kitchen levothyroxine (SYNTHROID, LEVOTHROID) 112 MCG tablet Take 1 tablet (112 mcg total) by mouth daily.   Marland Kitchen lisinopril (PRINIVIL,ZESTRIL) 20 MG tablet TAKE 1 TABLET(20 MG) BY MOUTH DAILY   . potassium chloride (K-DUR) 10 MEQ tablet 2 tabs by mouth per day in the AM   . PROAIR HFA 108 (90 BASE) MCG/ACT inhaler INHALE 2 PUFFS BY MOUTH EVERY 6 HOURS AS NEEDED FOR WHEEZING OR SHORTNESS OF BREATH   . tiZANidine (ZANAFLEX) 4 MG tablet TAKE 1 TABLET BY MOUTH EVERY 6 HOURS AS NEEDED FOR MUSCLE SPASMS   . amLODipine (NORVASC) 5 MG tablet Take 1 tablet (5 mg total) by mouth daily. (Patient not taking: Reported on 02/14/2016)   . metFORMIN (GLUCOPHAGE-XR) 500 MG 24 hr tablet Take 2 tablets (1,000 mg total) by mouth daily with breakfast. (Patient not taking: Reported on 02/14/2016)   . metoprolol succinate (TOPROL-XL) 50 MG 24 hr tablet Take 1 tablet (50 mg total) by mouth daily. Take with or immediately following a meal. (Patient not taking: Reported on 02/14/2016)   . pantoprazole (PROTONIX) 40 MG tablet Take 1 tablet (40 mg total) by mouth 2 (  two) times daily. (Patient not taking: Reported on 02/14/2016)   . Respiratory Therapy Supplies (NEBULIZER) DEVI Use as directed four times daily (Patient not taking: Reported on 02/14/2016)   . simvastatin (ZOCOR) 40 MG tablet TAKE 1 TABLET(40 MG) BY MOUTH AT BEDTIME (Patient not taking: Reported on 02/14/2016)    No facility-administered encounter medications on file as of 02/14/2016.    Functional Status:   In your present state of health, do you have any difficulty performing the following activities: 12/08/2015 11/30/2015  Hearing? N N  Vision? N Y  Difficulty concentrating or making  decisions? N Y  Walking or climbing stairs? N Y  Dressing or bathing? N N  Doing errands, shopping? N Y    Fall/Depression Screening:    PHQ 2/9 Scores 11/24/2015 09/21/2015 06/14/2015  PHQ - 2 Score 6 0 0  PHQ- 9 Score 12 - -    Assessment:    Brad [name] with decreased swelling in legs, feet and abdomen.  He still has some soreness, but state its much better than before.  He continues to have some numbness and tingling in his left arm.  He has an appointment scheduled for next month with vein and vascular.    He reports that since his significant other's death, he has stopped taking several of his meds (Amlodipine, Metoprolol, Metformin, Simvastatin, and Pantoprazole).  Advised against stopping them, however he states "I don't need all that.  I feel much better since I stopped."  He reports his blood sugars ranging 130s-150s.  He state he does sporadically check his blood pressure (2-3 times/week) and reports it still being elevated at time, ranging 254D-826E systolic.  Advised to take all blood pressure medications, but he states he will think about it.  Advised to contact PCP regarding medications he is not taking (does not have to report back until December).    Brad [name] continues to discuss the death of his loved one, declines offer for grief counseling.  He as aware to notify this care manager if he changes his mind.  He denies any concerns at this time.  Advised to contact for questions.  Plan:   Will follow up next month, if no further concerns will discuss involvement with health coach versus case clsoure.  THN CM Care Plan Problem One        Most Recent Value   Care Plan Problem One  Elevated blood pressure   Role Documenting the Problem One  Care Management Coordinator   Care Plan for Problem One  Active   THN Long Term Goal (31-90 days)  Brad [name]'s blood pressure will consistently be within target range within the next 45 days   THN Long Term Goal Start Date  01/11/16 [Goal not met,  not taking meds consistently.  Date reset]   Interventions for Problem One Long Term Goal  Discussed complications of elevated blood pressure, including stroke and heart attack.   THN CM Short Term Goal #1 (0-30 days)  Brad [name] will report taking all medications as prescribed over the next 4 weeks   THN CM Short Term Goal #1 Start Date  01/11/16   THN CM Short Term Goal #1 Met Date  -- [Goal not met]   Interventions for Short Term Goal #1  Re-eduated on importance of taking medications as directed in effort to control blood pressure and decrease risk of additional strokes.  Discussed importance of taking all medications, including Norvasc   THN CM Short Term Goal #2 (0-30 days)  Brad [name]  will attend cardiology appointment this week   THN CM Short Term Goal #2 Start Date  11/16/15   THN CM Short Term Goal #2 Met Date  -- [Goal not met]   Interventions for Short Term Goal #2  Appointment confirmed for tomorrow, transportation confirmed.  Discussed the importance of following up with physician to assess current symptoms (numbness on left side, swelling in legs)    THN CM Care Plan Problem Two        Most Recent Value   Care Plan Problem Two  Leg swelling   Role Documenting the Problem Two  Care Management [place] for Problem Two  Not Active   THN CM Short Term Goal #1 (0-30 days)  Brad [name] will report decrease in leg swelling over the next 4 weeks   THN CM Short Term Goal #1 Start Date  01/11/16 [Goal not met, not following instructions for restrictions]   THN CM Short Term Goal #1 Met Date   02/14/16   Interventions for Short Term Goal #2   Re-educated on the importance of taking diuretic as prescribed, following fluid restriction orders, and wearing compression stockings     [name], RN, MSN [place] Manager [phone]

## 2016-02-16 MED FILL — *HARVONI 90-400 MG TABLET: 90-400 | 28 days supply | Qty: 28 | Fill #2

## 2016-03-03 ENCOUNTER — Encounter: Payer: Self-pay | Admitting: Vascular Surgery

## 2016-03-03 NOTE — Addendum Note (Signed)
Addended by: Reola Calkins on: 03/03/2016 02:35 PM   Modules accepted: Orders

## 2016-03-08 ENCOUNTER — Ambulatory Visit (INDEPENDENT_AMBULATORY_CARE_PROVIDER_SITE_OTHER): Payer: Medicare Other | Admitting: Pulmonary Disease

## 2016-03-08 ENCOUNTER — Other Ambulatory Visit: Payer: Self-pay | Admitting: *Deleted

## 2016-03-08 ENCOUNTER — Encounter: Payer: Self-pay | Admitting: *Deleted

## 2016-03-08 ENCOUNTER — Encounter: Payer: Self-pay | Admitting: Pulmonary Disease

## 2016-03-08 ENCOUNTER — Institutional Professional Consult (permissible substitution): Payer: Medicare Other | Admitting: Pulmonary Disease

## 2016-03-08 VITALS — BP 158/80 | HR 61 | Ht 71.0 in | Wt 254.2 lb

## 2016-03-08 DIAGNOSIS — F17200 Nicotine dependence, unspecified, uncomplicated: Secondary | ICD-10-CM

## 2016-03-08 DIAGNOSIS — J438 Other emphysema: Secondary | ICD-10-CM

## 2016-03-08 DIAGNOSIS — G4710 Hypersomnia, unspecified: Secondary | ICD-10-CM | POA: Diagnosis not present

## 2016-03-08 DIAGNOSIS — I639 Cerebral infarction, unspecified: Secondary | ICD-10-CM | POA: Diagnosis not present

## 2016-03-08 DIAGNOSIS — Z720 Tobacco use: Secondary | ICD-10-CM | POA: Diagnosis not present

## 2016-03-08 MED ORDER — UMECLIDINIUM-VILANTEROL 62.5-25 MCG/INH IN AEPB: 1.0000 | INHALATION_SPRAY | Freq: Every day | RESPIRATORY_TRACT | 0 refills | Status: DC

## 2016-03-08 NOTE — Assessment & Plan Note (Signed)
Continue Lasix, lisinopril Monitor renal function intermittently

## 2016-03-08 NOTE — Assessment & Plan Note (Signed)
You have moderate COPD Lung capacity is at 68%  You have to work on quitting smoking  Trial of ANORO once daily-call me back if this works for prescription

## 2016-03-08 NOTE — Assessment & Plan Note (Signed)
Smoking cessation was emphasized

## 2016-03-08 NOTE — Patient Outreach (Signed)
[facility]) Care Management  03/08/2016  HARLON KUTNER November 22, 1955 [id number]   Call placed to member to follow up on current status and to schedule home visit for this month.  Member agrees to visit next week.  Denies any urgent needs at this time.  [name], [place], MSN [place] [phone]

## 2016-03-08 NOTE — Assessment & Plan Note (Signed)
He does not want to pursue a sleep study at this time

## 2016-03-08 NOTE — Progress Notes (Signed)
Subjective:    Patient ID: Brad [name], male    DOB: [birth year], 60 y.o.   MRN: [id number]  HPI  Chief Complaint  Patient presents with  . Pulmonary Consult    Referred by [name]; trouble with breathing, smoker x 40 years.  coughing up a lot of white phlegm in the mornings.     60 year old heavy smoker referred for evaluation of COPD and sleep apnea. However he states that his breathing is much improved now down his leg swelling is down, he would never want to use his CPAP machine and therefore does not want further evaluation for sleep. His wife passed away recently from chronic lung disease and substance abuse so no bed partner history is available. He smokes about 1-1.5 packs per day since her teenage years, more than 70 pack years. He reports a chronic cough productive of minimal white sputum, he reports occasional wheezing especially when his leg swelling is worse. He has been diagnosed with chronic diastolic heart failure and pedal edema is much improved now with Lasix. He is also diabetic and hypertensive but has self stopped his metformin. He developed an upper GI bleed and anemia requiring blood transfusion He developed right shoulder infection after rotator cuff surgery in 2008 and has limited mobility of his right arm.  Epworth sleepiness score is about 10 but he denies excessive daytime somnolence and states that his daytime naps are improved. Bedtime is around 8-9 PM, the TV stays on through the night, he sleeps on his left side with 2 pillows, reports occasional coughing and choking episodes that woke him up, and gets out of bed around 4:56 AM feeling rested with occasional dryness of mouth but without headaches  There is no history suggestive of cataplexy, sleep paralysis or parasomnias      Significant tests/ events  Echo 09/2015 showed EF of 45% with grade 2 diastolic dysfunction Labs 01/2016 showed hemoglobin 9  Chest x-ray 12/2015 no infiltrates or  effusions  Spirometry 03/2016 showed ratio 68, FEV1 of 68% and FVC of 76%    Past Medical History:  Diagnosis Date  . Anemia    takes Ferrous Sulfated daily  . Arthritis   . Carotid stenosis 08/27/2014   takes Aspirin daily  . Chronic hepatitis C ([employer]) 09/17/2015  . COPD (chronic obstructive pulmonary disease) (HCC)    Albuterol daily as needed and Spiriva daily  . Depression   . Diabetes mellitus type II    takes Metformin and Glipizide daily  . Dizziness   . GERD (gastroesophageal reflux disease)   . GI bleed 12/2015  . Headache    occasionally  . History of blood clots    in right leg;being followed by Dr.Brabham for this  . History of blood transfusion 09/2014   2 units-no abnormal reaction noted  . History of gastric ulcer   . History of hiatal hernia   . HTN (hypertension) 09/14/2015  . Hyperlipidemia 08/27/2014   takes Simvastatin daily  . Hypertension    takes Amlodipine and Lisinopril daily  . Hypothyroidism 08/27/2014   takes Synthroid daily  . Insomnia    takes Pamelor nightly  . Joint pain    hips  . Muscle spasm    takes Zanaflex daily as needed  . MVP (mitral valve prolapse)   . Peripheral edema    takes Furosemide daily as needed  . Peripheral neuropathy ([place])   . Prurigo nodularis   . Shortness of breath dyspnea   .  Stroke ([place])   . Urinary frequency   . Urinary urgency     Past Surgical History:  Procedure Laterality Date  . ENDARTERECTOMY Left 11/13/2014   Procedure: ENDARTERECTOMY CAROTID WITH PATCH ANGIOPLASTY;  Surgeon: Serafina Mitchell, MD;  [facility];  Service: Vascular;  Laterality: Left;  . ESOPHAGOGASTRODUODENOSCOPY N/A 09/24/2014   Procedure: ESOPHAGOGASTRODUODENOSCOPY (EGD);  Surgeon: Winfield Cunas., MD;  Location: [facility] ENDOSCOPY;  Service: Endoscopy;  Laterality: N/A;  . I&D of right arm    . INCISION AND DRAINAGE PERIRECTAL ABSCESS  03/2009  . Neg Stress Test  2000  . PERIPHERAL VASCULAR CATHETERIZATION N/A 11/30/2015    Procedure: Carotid PTA/Stent Intervention;  Surgeon: Serafina Mitchell, MD;  Location: [place] CV LAB;  Service: Cardiovascular;  Laterality: N/A;  . ROTATOR CUFF REPAIR Right 2009   multiple f/u Sxs/infection Tx     Allergies  Allergen Reactions  . Doxycycline Hives and Itching  . Lipitor [Atorvastatin] Other (See Comments)    Myalgia   . Oxycodone Itching     Social History   Social History  . Marital status: Single    Spouse name: N/A  . Number of children: 0  . Years of education: N/A   Occupational History  . Disabled    Social History Main Topics  . Smoking status: Current Every Day Smoker    Packs/day: 1.50    Years: 48.00    Types: Cigarettes  . Smokeless tobacco: Never Used     Comment: 1-2 pks per day  . Alcohol use 0.6 oz/week    1 Cans of beer per week     Comment: rarely  . Drug use: No  . Sexual activity: Yes   Other Topics Concern  . Not on file   Social History Narrative   Divorced with fiance.   Disabled since June 2010 - right shoulder   No children.           Family History  Problem Relation Age of Onset  . Alcohol abuse Father   . Lung cancer Father   . Heart disease Mother     automated implantable cardioverter-defibrillator   . Diabetes type II Mother   . Lung cancer Maternal Grandmother   . Lung cancer Sister     Review of Systems  Constitutional: Negative for activity change, appetite change, chills, fever and unexpected weight change.  HENT: Negative for congestion, dental problem, postnasal drip, rhinorrhea, sneezing, sore throat, trouble swallowing and voice change.   Eyes: Negative for visual disturbance.  Respiratory: Positive for cough and shortness of breath. Negative for choking.   Cardiovascular: Positive for leg swelling. Negative for chest pain.  Gastrointestinal: Negative for abdominal pain, nausea and vomiting.  Genitourinary: Negative for difficulty urinating.  Musculoskeletal: Negative for arthralgias.   Skin: Negative for rash.  Psychiatric/Behavioral: Negative for behavioral problems and confusion.       Objective:   Physical Exam  Gen. Pleasant, obese, in no distress, normal affect ENT - no lesions, no post nasal drip, class 2 airway Neck: No JVD, no thyromegaly, no carotid bruits Lungs: no use of accessory muscles, no dullness to percussion, decreased without rales or rhonchi  Cardiovascular: Rhythm regular, heart sounds  normal, no murmurs or gallops, 2+ peripheral edema Abdomen: soft and non-tender, no hepatosplenomegaly, BS normal. Musculoskeletal: No deformities, no cyanosis or clubbing Neuro:  alert, non focal, no tremors       Assessment & Plan:

## 2016-03-08 NOTE — Patient Instructions (Signed)
You have moderate COPD Lung capacity is at 68%  You have to work on quitting smoking  Trial of ANORO once daily-call me back if this works for prescription

## 2016-03-09 ENCOUNTER — Ambulatory Visit (INDEPENDENT_AMBULATORY_CARE_PROVIDER_SITE_OTHER): Payer: Medicare Other | Admitting: Internal Medicine

## 2016-03-09 ENCOUNTER — Encounter: Payer: Self-pay | Admitting: Internal Medicine

## 2016-03-09 VITALS — BP 204/81 | Temp 97.7°F | Ht 71.0 in | Wt 257.0 lb

## 2016-03-09 DIAGNOSIS — B182 Chronic viral hepatitis C: Secondary | ICD-10-CM

## 2016-03-09 DIAGNOSIS — K7400 Hepatic fibrosis, unspecified: Secondary | ICD-10-CM | POA: Insufficient documentation

## 2016-03-09 DIAGNOSIS — I639 Cerebral infarction, unspecified: Secondary | ICD-10-CM | POA: Diagnosis not present

## 2016-03-09 NOTE — Progress Notes (Signed)
   Subjective:    Patient ID: Brad [name], male    DOB: 1956-07-05, 60 y.o.   MRN: [id number]  HPI Here for follow up of HCV Has genotype 1, viral load 1.7 million, elastography with F2/3.  Early viral load negative.  Has about 2 weeks left.  Feels less fatigue.  Stopped taking dm medication.    Review of Systems  Constitutional: Negative for fatigue.  Skin: Negative for rash.  Neurological: Negative for dizziness and headaches.       Objective:   Physical Exam  Constitutional: He appears well-developed and well-nourished.  Eyes: No scleral icterus.  Cardiovascular: Normal rate, regular rhythm and normal heart sounds.   Pulmonary/Chest: Breath sounds normal. No respiratory distress.  Skin: No rash noted.          Assessment & Plan:

## 2016-03-09 NOTE — Assessment & Plan Note (Signed)
Doing great.  End of treatment lab in 1 month and rtc 4 months for SVR12.

## 2016-03-09 NOTE — Assessment & Plan Note (Signed)
Moderate fibrosis, no indication for [place] screening.

## 2016-03-10 ENCOUNTER — Ambulatory Visit (HOSPITAL_COMMUNITY)
Admission: RE | Admit: 2016-03-10 | Discharge: 2016-03-10 | Disposition: A | Discharge status: Home / Self Care | Payer: Medicare Other | Source: Ambulatory Visit | Attending: Surgery | Admitting: Surgery

## 2016-03-10 ENCOUNTER — Ambulatory Visit (INDEPENDENT_AMBULATORY_CARE_PROVIDER_SITE_OTHER): Payer: Medicare Other | Admitting: Vascular Surgery

## 2016-03-10 ENCOUNTER — Encounter: Payer: Self-pay | Admitting: Vascular Surgery

## 2016-03-10 VITALS — BP 193/76 | HR 58 | Ht 71.0 in | Wt 257.0 lb

## 2016-03-10 DIAGNOSIS — E785 Hyperlipidemia, unspecified: Secondary | ICD-10-CM | POA: Insufficient documentation

## 2016-03-10 DIAGNOSIS — I739 Peripheral vascular disease, unspecified: Secondary | ICD-10-CM | POA: Diagnosis not present

## 2016-03-10 DIAGNOSIS — K219 Gastro-esophageal reflux disease without esophagitis: Secondary | ICD-10-CM | POA: Diagnosis not present

## 2016-03-10 DIAGNOSIS — E1142 Type 2 diabetes mellitus with diabetic polyneuropathy: Secondary | ICD-10-CM | POA: Insufficient documentation

## 2016-03-10 DIAGNOSIS — J449 Chronic obstructive pulmonary disease, unspecified: Secondary | ICD-10-CM | POA: Insufficient documentation

## 2016-03-10 DIAGNOSIS — I10 Essential (primary) hypertension: Secondary | ICD-10-CM | POA: Diagnosis not present

## 2016-03-10 DIAGNOSIS — I70219 Atherosclerosis of native arteries of extremities with intermittent claudication, unspecified extremity: Secondary | ICD-10-CM

## 2016-03-10 DIAGNOSIS — G4700 Insomnia, unspecified: Secondary | ICD-10-CM | POA: Diagnosis not present

## 2016-03-10 DIAGNOSIS — I639 Cerebral infarction, unspecified: Secondary | ICD-10-CM | POA: Diagnosis not present

## 2016-03-10 DIAGNOSIS — R938 Abnormal findings on diagnostic imaging of other specified body structures: Secondary | ICD-10-CM | POA: Diagnosis not present

## 2016-03-10 DIAGNOSIS — F329 Major depressive disorder, single episode, unspecified: Secondary | ICD-10-CM | POA: Diagnosis not present

## 2016-03-10 DIAGNOSIS — R0989 Other specified symptoms and signs involving the circulatory and respiratory systems: Secondary | ICD-10-CM | POA: Diagnosis present

## 2016-03-10 NOTE — Progress Notes (Signed)
Referred by:  Biagio Borg, MD [place], [place] [id number]  Reason for referral: bilateral abnormal ABI  History of Present Illness  Brad [name] is a 60 y.o. (08-15-1955) male who presents with chief complaint: improved B leg pain.  Pt is s/p R CAS and L CEA with Dr. Trula Slade.  Onset of symptom occurred years ago.  Pain is described as achy, severity 3-6/10, and associated with ambulating short distances.  The patient attributes his pain to swelling his lower legs.  Patient has attempted to treat this pain with rest and "water pills."  The patient notes diuresis has greatly improved his leg swelling which led to improvement in his leg pain.  He also describes VSU in his lower legs.  The patient has no rest pain symptoms also and no current leg wounds/ulcers.  Atherosclerotic risk factors include: DM.   Past Medical History:  Diagnosis Date  . Anemia    takes Ferrous Sulfated daily  . Arthritis   . Carotid stenosis 08/27/2014   takes Aspirin daily  . Chronic hepatitis C ([place]) 09/17/2015  . COPD (chronic obstructive pulmonary disease) (HCC)    Albuterol daily as needed and Spiriva daily  . Depression   . Diabetes mellitus type II    takes Metformin and Glipizide daily  . Dizziness   . GERD (gastroesophageal reflux disease)   . GI bleed 12/2015  . Headache    occasionally  . History of blood clots    in right leg;being followed by Dr.Brabham for this  . History of blood transfusion 09/2014   2 units-no abnormal reaction noted  . History of gastric ulcer   . History of hiatal hernia   . HTN (hypertension) 09/14/2015  . Hyperlipidemia 08/27/2014   takes Simvastatin daily  . Hypertension    takes Amlodipine and Lisinopril daily  . Hypothyroidism 08/27/2014   takes Synthroid daily  . Insomnia    takes Pamelor nightly  . Joint pain    hips  . Muscle spasm    takes Zanaflex daily as needed  . MVP (mitral valve prolapse)   . Peripheral edema    takes  Furosemide daily as needed  . Peripheral neuropathy ([place])   . Prurigo nodularis   . Shortness of breath dyspnea   . Stroke ([place])   . Urinary frequency   . Urinary urgency     Past Surgical History:  Procedure Laterality Date  . ENDARTERECTOMY Left 11/13/2014   Procedure: ENDARTERECTOMY CAROTID WITH PATCH ANGIOPLASTY;  Surgeon: Serafina Mitchell, MD;  Location: [facility] OR;  Service: Vascular;  Laterality: Left;  . ESOPHAGOGASTRODUODENOSCOPY N/A 09/24/2014   Procedure: ESOPHAGOGASTRODUODENOSCOPY (EGD);  Surgeon: Winfield Cunas., MD;  Location: [facility] ENDOSCOPY;  Service: Endoscopy;  Laterality: N/A;  . I&D of right arm    . INCISION AND DRAINAGE PERIRECTAL ABSCESS  03/2009  . Neg Stress Test  2000  . PERIPHERAL VASCULAR CATHETERIZATION N/A 11/30/2015   Procedure: Carotid PTA/Stent Intervention;  Surgeon: Serafina Mitchell, MD;  Location: [facility];  Service: Cardiovascular;  Laterality: N/A;  . ROTATOR CUFF REPAIR Right 2009   multiple f/u Sxs/infection Tx    Social History   Social History  . Marital status: Single    Spouse name: N/A  . Number of children: 0  . Years of education: N/A   Occupational History  . Disabled    Social History Main Topics  . Smoking status: Current Every Day  Smoker    Packs/day: 1.50    Years: 48.00    Types: Cigarettes  . Smokeless tobacco: Never Used     Comment: 1-2 pks per day  . Alcohol use 0.6 oz/week    1 Cans of beer per week     Comment: rarely  . Drug use: No  . Sexual activity: Yes   Other Topics Concern  . Not on file   Social History Narrative   Divorced with fiance.   Disabled since June 2010 - right shoulder   No children.          Family History  Problem Relation Age of Onset  . Heart disease Mother     automated implantable cardioverter-defibrillator   . Diabetes type II Mother   . Alcohol abuse Father   . Lung cancer Father   . Lung cancer Maternal Grandmother   . Lung cancer Sister     Current Outpatient  Prescriptions  Medication Sig Dispense Refill  . aspirin EC 81 MG tablet Take 1 tablet (81 mg total) by mouth daily. 90 tablet 11  . Blood Pressure Monitor DEVI Use to check Blood pressure daily 1 Device 0  . ferrous sulfate 325 (65 FE) MG tablet Take 1 tablet (325 mg total) by mouth 3 (three) times daily with meals. 30 tablet 3  . furosemide (LASIX) 80 MG tablet Take 1 tablet (80 mg total) by mouth daily. 90 tablet 3  . gabapentin (NEURONTIN) 600 MG tablet Take 1 tablet (600 mg total) by mouth 3 (three) times daily. 270 tablet 1  . glucose blood (ACCU-CHEK AVIVA PLUS) test strip Use to check blood sugars daily Dx E11.9 100 each 3  . ipratropium (ATROVENT HFA) 17 MCG/ACT inhaler Inhale 2 puffs into the lungs 4 (four) times daily. 3 Inhaler 3  . Lancets MISC 1 each by Other route See admin instructions. Reported on 09/07/2015    . Ledipasvir-Sofosbuvir (HARVONI) 90-400 MG TABS Take 1 tablet by mouth daily. 28 tablet 2  . levothyroxine (SYNTHROID, LEVOTHROID) 112 MCG tablet Take 1 tablet (112 mcg total) by mouth daily. 90 tablet 3  . lisinopril (PRINIVIL,ZESTRIL) 20 MG tablet TAKE 1 TABLET(20 MG) BY MOUTH DAILY 90 tablet 0  . potassium chloride (K-DUR) 10 MEQ tablet 2 tabs by mouth per day in the AM 180 tablet 3  . PROAIR HFA 108 (90 BASE) MCG/ACT inhaler INHALE 2 PUFFS BY MOUTH EVERY 6 HOURS AS NEEDED FOR WHEEZING OR SHORTNESS OF BREATH 8.5 g 11  . Respiratory Therapy Supplies (NEBULIZER) DEVI Use as directed four times daily 1 each 0  . tiZANidine (ZANAFLEX) 4 MG tablet TAKE 1 TABLET BY MOUTH EVERY 6 HOURS AS NEEDED FOR MUSCLE SPASMS 60 tablet 0  . umeclidinium-vilanterol (ANORO ELLIPTA) 62.5-25 MCG/INH AEPB Inhale 1 puff into the lungs daily. 1 each 0   No current facility-administered medications for this visit.     Allergies  Allergen Reactions  . Doxycycline Hives and Itching  . Lipitor [Atorvastatin] Other (See Comments)    Myalgia   . Oxycodone Itching     REVIEW OF SYSTEMS:   (Positives checked otherwise negative)  CARDIOVASCULAR:   '[ ]'$  chest pain,  '[ ]'$  chest pressure,  '[ ]'$  palpitations,  '[ ]'$  shortness of breath when laying flat,  '[x]'$  shortness of breath with exertion,   '[x]'$  pain in feet when walking,  '[ ]'$  pain in feet when laying flat, '[ ]'$  history of blood clot in veins (DVT),  '[ ]'$   history of phlebitis,  '[ ]'$  swelling in legs,  '[ ]'$  varicose veins  PULMONARY:   '[x]'$  productive cough,  '[ ]'$  asthma,  '[x]'$  wheezing  NEUROLOGIC:   '[ ]'$  weakness in arms or legs,  '[ ]'$  numbness in arms or legs,  '[ ]'$  difficulty speaking or slurred speech,  '[ ]'$  temporary loss of vision in one eye,  '[ ]'$  dizziness  HEMATOLOGIC:   '[ ]'$  bleeding problems,  '[ ]'$  problems with blood clotting too easily  MUSCULOSKEL:   '[ ]'$  joint pain, '[ ]'$  joint swelling  GASTROINTEST:   '[ ]'$  vomiting blood,  '[ ]'$  blood in stool     GENITOURINARY:   '[ ]'$  burning with urination,  '[ ]'$  blood in urine  PSYCHIATRIC:   '[ ]'$  history of major depression  INTEGUMENTARY:   '[ ]'$  rashes,  '[ ]'$  ulcers  CONSTITUTIONAL:   '[ ]'$  fever,  '[ ]'$  chills   For VQI Use Only  PRE-ADM LIVING: Home  AMB STATUS: Ambulatory  CAD Sx: None  PRIOR CHF: None  STRESS TEST: [x ] No, '[ ]'$  Normal, '[ ]'$  + ischemia, '[ ]'$  + MI, '[ ]'$  Both   Physical Examination  Vitals:   03/10/16 1009 03/10/16 1010  BP: (!) 199/80 (!) 193/76  Pulse: (!) 58   SpO2: 98%   Weight: 257 lb (116.6 kg)   Height: '5\' 11"'$  (1.803 m)    Body mass index is 35.84 kg/m.  General: A&O x 3, WD, Obese,   Head: [place]/AT  Ear/Nose/Throat: Hearing grossly intact, nares without erythema or drainage, oropharynx without Erythema/Exudate, Mallampati score: 3  Eyes: PERRLA, EOMI  Neck: Supple, no nuchal rigidity, no palpable LAD  Pulmonary: Sym exp, good air movt, CTAB, no rales and rhonchi; +faint wheezing BUL  Cardiac: RRR, Nl S1, S2, no Murmurs, rubs or gallops  Vascular: Vessel Right Left  Radial Palpable Palpable  Brachial Palpable  Palpable  Carotid Palpable, without bruit Palpable, without bruit  Aorta Not palpable N/A  Femoral Palpable Palpable  Popliteal Not palpable Not palpable  PT Not Palpable Not Palpable  DP Faintly Palpable Faintly Palpable   Gastrointestinal: soft, NTND, no G/R, no HSM, no masses, no CVAT B  Musculoskeletal: M/S 5/5 throughout , Extremities without ischemic changes, 1+ edema B, obvious varicosities, small amount of LDS, mild cyanosis in both feet  Neurologic: CN 2-12 intact , Pain and light touch intact in extremities , Motor exam as listed above  Psychiatric: Judgment intact, Mood & affect appropriate for pt's clinical situation  Dermatologic: See M/S exam for extremity exam, no rashes otherwise noted  Lymph : No Cervical, Axillary, or Inguinal lymphadenopathy    Non-Invasive Vascular Imaging  ABI (Date: 03/10/2016)  R:   ABI: 0.84 (0.71),   DP: mono,   PT: mono,   TBI: 0.35  L:   ABI: 1.07 (0.97),   DP: mono,   PT: mono,   TBI: 0.45   Outside Studies/Documentation 10 pages of outside documents were reviewed including: hospital records.  Medical Decision Making  Brad [name] is a 60 y.o. male who presents with: s/p R CAS and L CEA, BLE intermittent claudication, BLE CVI, COPD   Pt's leg sx are improved.  He reported is less sx after diuresis.  I suspect some of this may be optimization in his COPD also.  His ABI waveforms are consistent with moderate to severe PAD.  His intermittent claudication sx may be limited by his COPD limiting his  ambulation ability.  We discussed the importance of smoking cessation in the global picture of his health  Based on this patient's history and physical exam, I recommend: q3-6 mon ABI.  Studies can be sync with his carotid studies.  I discussed with the patient the natural history of intermittent claudication: 75% of patients have stable or improved symptoms in a year an only 2% require amputation. Eventually 20% may  require intervention in a year.  I discussed in depth with the patient the nature of atherosclerosis, and emphasized the importance of maximal medical management including strict control of blood pressure, blood glucose, and lipid levels, antiplatelet agent, obtaining regular exercise, and cessation of smoking.    The patient is aware that without maximal medical management the underlying atherosclerotic disease process will progress, limiting the benefit of any interventions.  I discussed in depth with the patient a walking plan and how to execute such.  The patient is not interested in starting Pletal. The patient is currently not on a statin: due to allergies.  The patient is currently on an anti-platelet: ASA.  The patient will follow up with Dr. Trula Slade for subsequent mgmt of his carotid disease and PAD.  Thank you for allowing [place] to participate in this patient's care.   Adele Barthel, MD [facility] Office: [phone] Pager: [phone]  03/10/2016, 10:41 AM

## 2016-03-14 ENCOUNTER — Other Ambulatory Visit: Payer: Self-pay | Admitting: *Deleted

## 2016-03-14 ENCOUNTER — Telehealth: Payer: Self-pay | Admitting: Pulmonary Disease

## 2016-03-14 DIAGNOSIS — F17200 Nicotine dependence, unspecified, uncomplicated: Secondary | ICD-10-CM

## 2016-03-14 DIAGNOSIS — J438 Other emphysema: Secondary | ICD-10-CM

## 2016-03-14 DIAGNOSIS — G4710 Hypersomnia, unspecified: Secondary | ICD-10-CM

## 2016-03-14 MED ORDER — UMECLIDINIUM-VILANTEROL 62.5-25 MCG/INH IN AEPB
1.0000 | INHALATION_SPRAY | Freq: Every day | RESPIRATORY_TRACT | 6 refills | Status: DC
Start: 2016-03-14 — End: 2016-07-10

## 2016-03-14 NOTE — Patient Outreach (Signed)
[facility]) Care Management   03/14/2016  Brad [name] May 22, 1956 [id number]  Brad [name] is an 60 y.o. male  Subjective:   Member reports that he is "doing the best I can."  He reports taking certain medications as instructed, but continues to report that he is not taking all that is prescribed, including his Amlodipine, Metformin and Metoprolol.  He denies informing his PCP that he is not taking all of his medications.  He state that his blood sugar has "been better" since he stopped the Metformin and that during his last PCP office visit he was told to "keep doing what you are doing."  He was not taking these medications at that time and decided to continue to not take them.  He does continue to smoke 1-1.5 packs of cigarettes/day.  He denies using the patches that he has available.  He discusses again the death of his significant other, but continue to deny the need for counseling.  Objective:   Review of Systems  Constitutional: Negative.   HENT: Negative.   Eyes: Negative.   Respiratory: Positive for shortness of breath.        With activity  Cardiovascular: Positive for leg swelling.  Gastrointestinal: Negative.   Genitourinary: Negative.   Musculoskeletal: Negative.   Skin: Negative.   Neurological: Negative.   Endo/Heme/Allergies: Negative.   Psychiatric/Behavioral: Negative.     Physical Exam  Constitutional: He is oriented to person, place, and time. He appears well-developed and well-nourished.  Neck: Normal range of motion.  Cardiovascular: Normal rate, regular rhythm and normal heart sounds.   Respiratory: Effort normal and breath sounds normal.  GI: Soft. Bowel sounds are normal.  Musculoskeletal: Normal range of motion.  Neurological: He is alert and oriented to person, place, and time.  Skin: Skin is warm and dry.    BP (!) 162/88 (BP Location: Right Arm, Patient Position: Sitting, Cuff Size: Normal)   Pulse 70   Resp 18   SpO2  97%    Encounter Medications:   Outpatient Encounter Prescriptions as of 03/14/2016  Medication Sig Note  . aspirin EC 81 MG tablet Take 1 tablet (81 mg total) by mouth daily.   . Blood Pressure Monitor DEVI Use to check Blood pressure daily 09/07/2015: Patient reported 09/07/2015 he does not have a BP cuff.   . ferrous sulfate 325 (65 FE) MG tablet Take 1 tablet (325 mg total) by mouth 3 (three) times daily with meals.   . furosemide (LASIX) 80 MG tablet Take 1 tablet (80 mg total) by mouth daily.   Marland Kitchen gabapentin (NEURONTIN) 600 MG tablet Take 1 tablet (600 mg total) by mouth 3 (three) times daily.   Marland Kitchen glucose blood (ACCU-CHEK AVIVA PLUS) test strip Use to check blood sugars daily Dx E11.9   . ipratropium (ATROVENT HFA) 17 MCG/ACT inhaler Inhale 2 puffs into the lungs 4 (four) times daily.   . Lancets MISC 1 each by Other route See admin instructions. Reported on 09/07/2015   . Ledipasvir-Sofosbuvir (HARVONI) 90-400 MG TABS Take 1 tablet by mouth daily.   Marland Kitchen levothyroxine (SYNTHROID, LEVOTHROID) 112 MCG tablet Take 1 tablet (112 mcg total) by mouth daily.   Marland Kitchen lisinopril (PRINIVIL,ZESTRIL) 20 MG tablet TAKE 1 TABLET(20 MG) BY MOUTH DAILY   . potassium chloride (K-DUR) 10 MEQ tablet 2 tabs by mouth per day in the AM   . umeclidinium-vilanterol (ANORO ELLIPTA) 62.5-25 MCG/INH AEPB Inhale 1 puff into the lungs daily.   Marland Kitchen PROAIR  HFA 108 (90 BASE) MCG/ACT inhaler INHALE 2 PUFFS BY MOUTH EVERY 6 HOURS AS NEEDED FOR WHEEZING OR SHORTNESS OF BREATH (Patient not taking: Reported on 03/14/2016)   . Respiratory Therapy Supplies (NEBULIZER) DEVI Use as directed four times daily (Patient not taking: Reported on 03/14/2016)   . tiZANidine (ZANAFLEX) 4 MG tablet TAKE 1 TABLET BY MOUTH EVERY 6 HOURS AS NEEDED FOR MUSCLE SPASMS (Patient not taking: Reported on 03/14/2016)    No facility-administered encounter medications on file as of 03/14/2016.     Functional Status:   In your present state of health, do you have any  difficulty performing the following activities: 12/08/2015 11/30/2015  Hearing? N N  Vision? N Y  Difficulty concentrating or making decisions? N Y  Walking or climbing stairs? N Y  Dressing or bathing? N N  Doing errands, shopping? N Y  Conservation officer, nature and eating ? - -  Using the Toilet? - -  In the past six months, have you accidently leaked urine? - -  Do you have problems with loss of bowel control? - -  Managing your Medications? - -  Managing your Finances? - -  Housekeeping or managing your Housekeeping? - -  Some recent data might be hidden    Fall/Depression Screening:    PHQ 2/9 Scores 03/09/2016 11/24/2015 09/21/2015 06/14/2015  PHQ - 2 Score 0 6 0 0  PHQ- 9 Score - 12 - -    Assessment:    Member has decreased swelling in legs, remain with swelling in his feet.  He reports compliance with Lasix, encouraged to elevate legs/feet while sitting.  He reports compliance with new inhaler, stating his breathing is "much better."  He state he will notify the pulmonary physician to provide prescription for long term use.    He has been recording his blood sugars (range 90s-150s) and his blood pressure for the past several days.  His blood pressure remains elevated (160s/80-100s).  Discussed the importance of taking all blood pressure medications prescribed, he state that he has "quite a few beers and cigarettes yesterday" states that contributed to his elevated pressure today.  He continues to deny the need to go back to his other medications, advised to contact his PCP to inform him of only taking Lisinopril and provide blood pressure readings.  He state "He don't need to be worried with all that."  He state that he will continue to monitor and will inform PCP if it remains elevated.    Denies any further concerns or needs at this time.  He is advised that this care manager will follow up within the next 2 weeks regarding his blood pressure, and he is aware or plan to close case.  He  verbalizes understanding.  Plan:   Will follow up with member within the next 2 weeks, plan to close at that time pending no further concerns.  [facility] CM Care Plan Problem One   Flowsheet Row Most Recent Value  Care Plan Problem One  Elevated blood pressure  Role Documenting the Problem One  Care Management Coordinator  Care Plan for Problem One  Not Active  THN Long Term Goal (31-90 days)  Member's blood pressure will consistently be within target range within the next 45 days  THN Long Term Goal Start Date  01/11/16 [Goal not met, not taking meds consistently.  Date reset]  THN Long Term Goal Met Date  -- [Goal not met]  Interventions for Problem One Long Term Goal  Discussed  complications of elevated blood pressure, including stroke and heart attack.  THN CM Short Term Goal #1 (0-30 days)  Member will report taking all medications as prescribed over the next 4 weeks  THN CM Short Term Goal #1 Start Date  01/11/16  THN CM Short Term Goal #1 Met Date  -- [Goal not met]  Interventions for Short Term Goal #1  Re-eduated on importance of taking medications as directed in effort to control blood pressure and decrease risk of additional strokes.  Discussed importance of taking all medications, including Norvasc  THN CM Short Term Goal #2 (0-30 days)  Member will attend cardiology appointment this week  THN CM Short Term Goal #2 Start Date  11/16/15  THN CM Short Term Goal #2 Met Date  -- [Goal not met]  Interventions for Short Term Goal #2  Appointment confirmed for tomorrow, transportation confirmed.  Discussed the importance of following up with physician to assess current symptoms (numbness on left side, swelling in legs)    THN CM Care Plan Problem Two   Flowsheet Row Most Recent Value  Care Plan Problem Two  Leg swelling  Role Documenting the Problem Two  Care Management [place] for Problem Two  Not Active  THN CM Short Term Goal #1 (0-30 days)  Member will report decrease  in leg swelling over the next 4 weeks  THN CM Short Term Goal #1 Start Date  01/11/16 [Goal not met, not following instructions for restrictions]  THN CM Short Term Goal #1 Met Date   02/14/16  Interventions for Short Term Goal #2   Re-educated on the importance of taking diuretic as prescribed, following fluid restriction orders, and wearing compression stockings       Brad David, RN, MSN [place] Manager [phone]

## 2016-03-14 NOTE — Telephone Encounter (Signed)
OK 

## 2016-03-14 NOTE — Telephone Encounter (Signed)
Per 03/09/16 OV: You have moderate COPD Lung capacity is at 68% You have to work on quitting smoking Trial of ANORO once daily-call me back if this works for prescription ---  Called spoke with pt. He reports the anoro has worked very well for him and is requesting this to be called in. I have done so and will forward to RA as an [place]

## 2016-03-16 ENCOUNTER — Other Ambulatory Visit: Payer: Self-pay | Admitting: Internal Medicine

## 2016-03-28 ENCOUNTER — Other Ambulatory Visit: Payer: Self-pay | Admitting: *Deleted

## 2016-03-28 ENCOUNTER — Encounter: Payer: Self-pay | Admitting: *Deleted

## 2016-03-28 NOTE — Patient Outreach (Signed)
[facility]) Care Management  03/28/2016  XZAYVION VAETH [birth year] [id number]   Call placed to member to follow up on current status.  He state that he if "feeling a lot better."  He continues to believe that his health is improving because he has "taken myself off all those meds.  [name] were trying to kill me."  He proceeds with stating that his blood sugars are now down to 100s-150s instead of 200-300s, off his diabetic medication.  He report his weight has decreased from 270s down to 245 pounds.  He does admit that his blood pressure remains elevated, 150s-170s/70s-80s, but state that he will not restart any of the other medication for blood pressure (only taking Lisinopril).  He report that his next follow up with his PCP will be in December, and will discuss with him at that time if it remains elevated.  Advised member to contact PCP prior to December to report his current readings and seek advise.  He state that he will if he start to become symptomatic.  He is advised of the dangers of elevated blood pressure, including another stroke.  He states "I know, I'm just hard-headed."  He denies need for further involvement with [facility], but state he will keep contact information at hand in case his condition changes.  Will notify care management assistant and PCP of case closure.  [name], [place], MSN [place] [phone]

## 2016-04-13 ENCOUNTER — Other Ambulatory Visit: Payer: Medicare Other

## 2016-04-13 DIAGNOSIS — B182 Chronic viral hepatitis C: Secondary | ICD-10-CM

## 2016-04-15 ENCOUNTER — Other Ambulatory Visit: Payer: Self-pay | Admitting: Internal Medicine

## 2016-04-17 LAB — HEPATITIS C RNA QUANTITATIVE: HCV Quantitative: NOT DETECTED IU/mL (ref <15)

## 2016-04-18 DIAGNOSIS — M25512 Pain in left shoulder: Secondary | ICD-10-CM | POA: Diagnosis not present

## 2016-04-18 DIAGNOSIS — M25551 Pain in right hip: Secondary | ICD-10-CM | POA: Diagnosis not present

## 2016-04-18 NOTE — Telephone Encounter (Signed)
Done erx 

## 2016-04-24 DIAGNOSIS — M25551 Pain in right hip: Secondary | ICD-10-CM | POA: Diagnosis not present

## 2016-05-02 DIAGNOSIS — R202 Paresthesia of skin: Secondary | ICD-10-CM | POA: Diagnosis not present

## 2016-05-09 ENCOUNTER — Ambulatory Visit (INDEPENDENT_AMBULATORY_CARE_PROVIDER_SITE_OTHER): Payer: Medicare Other | Admitting: Orthopaedic Surgery

## 2016-05-09 DIAGNOSIS — G5602 Carpal tunnel syndrome, left upper limb: Secondary | ICD-10-CM

## 2016-05-09 DIAGNOSIS — M1611 Unilateral primary osteoarthritis, right hip: Secondary | ICD-10-CM | POA: Diagnosis not present

## 2016-05-10 ENCOUNTER — Encounter: Payer: Self-pay | Admitting: Internal Medicine

## 2016-05-10 ENCOUNTER — Ambulatory Visit (INDEPENDENT_AMBULATORY_CARE_PROVIDER_SITE_OTHER): Payer: Medicare Other | Admitting: Internal Medicine

## 2016-05-10 ENCOUNTER — Other Ambulatory Visit (INDEPENDENT_AMBULATORY_CARE_PROVIDER_SITE_OTHER): Payer: Medicare Other

## 2016-05-10 VITALS — BP 146/82 | HR 65 | Temp 97.8°F | Resp 20 | Wt 249.5 lb

## 2016-05-10 DIAGNOSIS — R634 Abnormal weight loss: Secondary | ICD-10-CM

## 2016-05-10 DIAGNOSIS — Z23 Encounter for immunization: Secondary | ICD-10-CM | POA: Diagnosis not present

## 2016-05-10 DIAGNOSIS — F4321 Adjustment disorder with depressed mood: Secondary | ICD-10-CM | POA: Insufficient documentation

## 2016-05-10 DIAGNOSIS — Z01818 Encounter for other preprocedural examination: Secondary | ICD-10-CM | POA: Insufficient documentation

## 2016-05-10 DIAGNOSIS — E1159 Type 2 diabetes mellitus with other circulatory complications: Secondary | ICD-10-CM

## 2016-05-10 DIAGNOSIS — E785 Hyperlipidemia, unspecified: Secondary | ICD-10-CM

## 2016-05-10 DIAGNOSIS — I639 Cerebral infarction, unspecified: Secondary | ICD-10-CM

## 2016-05-10 DIAGNOSIS — F4320 Adjustment disorder, unspecified: Secondary | ICD-10-CM | POA: Diagnosis not present

## 2016-05-10 LAB — URINALYSIS, ROUTINE W REFLEX MICROSCOPIC
Bilirubin Urine: NEGATIVE
Ketones, ur: NEGATIVE
Leukocytes, UA: NEGATIVE
Nitrite: NEGATIVE
Specific Gravity, Urine: 1.015 (ref 1.000–1.030)
Total Protein, Urine: >=300 — AB
Urine Glucose: NEGATIVE
Urobilinogen, UA: 0.2 (ref 0.0–1.0)
pH: 6 (ref 5.0–8.0)

## 2016-05-10 LAB — BASIC METABOLIC PANEL
BUN: 16 mg/dL (ref 6–23)
CO2: 27 mEq/L (ref 19–32)
Calcium: 8.3 mg/dL — ABNORMAL LOW (ref 8.4–10.5)
Chloride: 104 mEq/L (ref 96–112)
Creatinine, Ser: 1.01 mg/dL (ref 0.40–1.50)
GFR: 80.51 mL/min (ref >60.00)
Glucose, Bld: 88 mg/dL (ref 70–99)
Potassium: 4.2 mEq/L (ref 3.5–5.1)
Sodium: 138 mEq/L (ref 135–145)

## 2016-05-10 LAB — CBC WITH DIFFERENTIAL/PLATELET
Basophils Absolute: 0.1 10*3/uL (ref 0.0–0.1)
Basophils Relative: 0.9 % (ref 0.0–3.0)
Eosinophils Absolute: 0.2 10*3/uL (ref 0.0–0.7)
Eosinophils Relative: 2.4 % (ref 0.0–5.0)
HCT: 38.1 % — ABNORMAL LOW (ref 39.0–52.0)
Hemoglobin: 13.1 g/dL (ref 13.0–17.0)
Lymphocytes Relative: 17.5 % (ref 12.0–46.0)
Lymphs Abs: 1.6 10*3/uL (ref 0.7–4.0)
MCHC: 34.3 g/dL (ref 30.0–36.0)
MCV: 75.1 fl — ABNORMAL LOW (ref 78.0–100.0)
Monocytes Absolute: 0.6 10*3/uL (ref 0.1–1.0)
Monocytes Relative: 6.5 % (ref 3.0–12.0)
Neutro Abs: 6.7 10*3/uL (ref 1.4–7.7)
Neutrophils Relative %: 72.7 % (ref 43.0–77.0)
Platelets: 274 10*3/uL (ref 150.0–400.0)
RBC: 5.07 Mil/uL (ref 4.22–5.81)
RDW: 15.5 % (ref 11.5–15.5)
WBC: 9.2 10*3/uL (ref 4.0–10.5)

## 2016-05-10 LAB — MICROALBUMIN / CREATININE URINE RATIO
Creatinine,U: 53.6 mg/dL
Microalb Creat Ratio: 182.5 mg/g — ABNORMAL HIGH (ref 0.0–30.0)
Microalb, Ur: 97.9 mg/dL — ABNORMAL HIGH (ref 0.0–1.9)

## 2016-05-10 LAB — LDL CHOLESTEROL, DIRECT: Direct LDL: 125 mg/dL

## 2016-05-10 LAB — HEPATIC FUNCTION PANEL
ALT: 15 U/L (ref 0–53)
AST: 20 U/L (ref 0–37)
Albumin: 2.6 g/dL — ABNORMAL LOW (ref 3.5–5.2)
Alkaline Phosphatase: 113 U/L (ref 39–117)
Bilirubin, Direct: 0 mg/dL (ref 0.0–0.3)
Total Bilirubin: 0.2 mg/dL (ref 0.2–1.2)
Total Protein: 6.6 g/dL (ref 6.0–8.3)

## 2016-05-10 LAB — TSH: TSH: 5.09 u[IU]/mL — ABNORMAL HIGH (ref 0.35–4.50)

## 2016-05-10 LAB — LIPID PANEL
Cholesterol: 189 mg/dL (ref 0–200)
HDL: 27.5 mg/dL — ABNORMAL LOW (ref >39.00)
NonHDL: 161
Total CHOL/HDL Ratio: 7
Triglycerides: 316 mg/dL — ABNORMAL HIGH (ref 0.0–149.0)
VLDL: 63.2 mg/dL — ABNORMAL HIGH (ref 0.0–40.0)

## 2016-05-10 LAB — HEMOGLOBIN A1C: Hgb A1c MFr Bld: 6.1 % (ref 4.6–6.5)

## 2016-05-10 NOTE — Patient Instructions (Addendum)
You had the flu shot today  Please continue all other medications as before, and refills have been done if requested.  Please have the pharmacy call with any other refills you may need.  Please continue your efforts at being more active, low cholesterol diet, and weight control.  Please keep your appointments with your specialists as you may have planned  Please go to the LAB in the Basement (turn left off the elevator) for the tests to be done today  You will be contacted by phone if any changes need to be made immediately.  Otherwise, you will receive a letter about your results with an explanation, but please check with MyChart first.  Please remember to sign up for MyChart if you have not done so, as this will be important to you in the future with finding out test results, communicating by private email, and scheduling acute appointments online when needed.  Please return in 6 months, or sooner if needed  We will sign the OK for shoulder surgury if your labs are OK

## 2016-05-10 NOTE — Progress Notes (Signed)
Pre visit review using our clinic review tool, if applicable. No additional management support is needed unless otherwise documented below in the visit note. 

## 2016-05-10 NOTE — Progress Notes (Signed)
Subjective:    Patient ID: Brad [name], male    DOB: 1956-07-09, 60 y.o.   MRN: [id number]  HPI  Here with wt loss, was 279 in mar 2017, then lately; Wt Readings from Last 3 Encounters:  05/10/16 249 lb 8 oz (113.2 kg)  03/10/16 257 lb (116.6 kg)  03-20-2016 257 lb (116.6 kg)  Wife died with COPD 01-31-2023, since then has less sweets and overall less calories since then; has been through grieving, but denies "more depression than the average joe". No SI or HI, also has some financial stressors.  Has seen ortho , will need left shoulder sugury since last yr he has been putting off, and has chronic pain.  Also has left CTS, told this could be fixed at same surgury. Denies worsening reflux, abd pain, dysphagia, n/v, bowel change or blood.  Pt denies fever, night sweats, loss of appetite, or other constitutional symptoms.  No hx of malignancy, had cxr/abd [place] in last 6 months.  Pt denies chest pain, increased sob or doe, wheezing, orthopnea, PND, increased LE swelling, palpitations, dizziness or syncope.   Pt denies polydipsia, polyuria, or low sugar symptoms such as weakness or confusion improved with po intake.  Pt stat, trying to follow lower cholesterol, diabetic diet, but has stopped his OHA (metformin at 1000 qam) , but says cbgs have been 98-156.  Needs cleared for left shoulder surgury with DJD.   Past Medical History:  Diagnosis Date  . Anemia    takes Ferrous Sulfated daily  . Arthritis   . Carotid stenosis 08/27/2014   takes Aspirin daily  . Chronic hepatitis C ([place]) 09/17/2015  . COPD (chronic obstructive pulmonary disease) (HCC)    Albuterol daily as needed and Spiriva daily  . Depression   . Diabetes mellitus type II    takes Metformin and Glipizide daily  . Dizziness   . GERD (gastroesophageal reflux disease)   . GI bleed 12/2015  . Headache    occasionally  . History of blood clots    in right leg;being followed by Dr.Brabham for this  . History of blood transfusion 09/2014     2 units-no abnormal reaction noted  . History of gastric ulcer   . History of hiatal hernia   . HTN (hypertension) 09/14/2015  . Hyperlipidemia 08/27/2014   takes Simvastatin daily  . Hypertension    takes Amlodipine and Lisinopril daily  . Hypothyroidism 08/27/2014   takes Synthroid daily  . Insomnia    takes Pamelor nightly  . Joint pain    hips  . Muscle spasm    takes Zanaflex daily as needed  . MVP (mitral valve prolapse)   . Peripheral edema    takes Furosemide daily as needed  . Peripheral neuropathy ([place])   . Prurigo nodularis   . Shortness of breath dyspnea   . Stroke ([place])   . Urinary frequency   . Urinary urgency    Past Surgical History:  Procedure Laterality Date  . ENDARTERECTOMY Left 11/13/2014   Procedure: ENDARTERECTOMY CAROTID WITH PATCH ANGIOPLASTY;  Surgeon: Serafina Mitchell, MD;  Location: [facility] OR;  Service: Vascular;  Laterality: Left;  . ESOPHAGOGASTRODUODENOSCOPY N/A 09/24/2014   Procedure: ESOPHAGOGASTRODUODENOSCOPY (EGD);  Surgeon: Winfield Cunas., MD;  Location: [facility] ENDOSCOPY;  Service: Endoscopy;  Laterality: N/A;  . I&D of right arm    . INCISION AND DRAINAGE PERIRECTAL ABSCESS  03/2009  . Neg Stress Test  2000  . PERIPHERAL VASCULAR CATHETERIZATION N/A  11/30/2015   Procedure: Carotid PTA/Stent Intervention;  Surgeon: Serafina Mitchell, MD;  Location: [facility] LAB;  Service: Cardiovascular;  Laterality: N/A;  . ROTATOR CUFF REPAIR Right 2009   multiple f/u Sxs/infection Tx    reports that he has been smoking Cigarettes.  He has a 72.00 pack-year smoking history. He has never used smokeless tobacco. He reports that he drinks about 0.6 oz of alcohol per week . He reports that he does not use drugs. family history includes Alcohol abuse in his father; Diabetes type II in his mother; Heart disease in his mother; Lung cancer in his father, maternal grandmother, and sister. Allergies  Allergen Reactions  . Doxycycline Hives and Itching  . Lipitor  [Atorvastatin] Other (See Comments)    Myalgia   . Oxycodone Itching   Review of Systems  Constitutional: Negative for unusual diaphoresis or night sweats HENT: Negative for ear swelling or discharge Eyes: Negative for worsening visual haziness  Respiratory: Negative for choking and stridor.   Gastrointestinal: Negative for distension or worsening eructation Genitourinary: Negative for retention or change in urine volume.  Musculoskeletal: Negative for other MSK pain or swelling Skin: Negative for color change and worsening wound Neurological: Negative for tremors and numbness other than noted  Psychiatric/Behavioral: Negative for decreased concentration or agitation other than above       Objective:   Physical Exam BP (!) 146/82   Pulse 65   Temp 97.8 F (36.6 C) (Oral)   Resp 20   Wt 249 lb 8 oz (113.2 kg)   SpO2 97%   BMI 34.80 kg/m  VS noted,  Constitutional: Pt appears in no apparent distress HENT: Head: NCAT.  Right Ear: External ear normal.  Left Ear: External ear normal.  Eyes: . Pupils are equal, round, and reactive to light. Conjunctivae and EOM are normal Neck: Normal range of motion. Neck supple.  Cardiovascular: Normal rate and regular rhythm.   Pulmonary/Chest: Effort normal and breath sounds without rales or wheezing.  Abd:  Soft, NT, ND, + BS Neurological: Pt is alert. Not confused , motor grossly intact Skin: Skin is warm. No rash, no LE edema Psychiatric: Pt behavior is normal. No agitation. mild dysphoric    Assessment & Plan:

## 2016-05-10 NOTE — Assessment & Plan Note (Signed)
With recent wt loss, now not requiring OHA, for a1c today, and cont to monitor cbg's

## 2016-05-12 LAB — PROTEIN ELECTROPHORESIS, SERUM
Albumin ELP: 2.4 g/dL — ABNORMAL LOW (ref 3.8–4.8)
Alpha-1-Globulin: 0.4 g/dL — ABNORMAL HIGH (ref 0.2–0.3)
Alpha-2-Globulin: 1.4 g/dL — ABNORMAL HIGH (ref 0.5–0.9)
Beta 2: 0.4 g/dL (ref 0.2–0.5)
Beta Globulin: 0.4 g/dL (ref 0.4–0.6)
Gamma Globulin: 0.8 g/dL (ref 0.8–1.7)
Total Protein, Serum Electrophoresis: 5.7 g/dL — ABNORMAL LOW (ref 6.1–8.1)

## 2016-05-13 NOTE — Assessment & Plan Note (Signed)
Etiology unclear, for labs as ordered, suspect likely related to gieving process,  to f/u any worsening symptoms or concerns

## 2016-05-13 NOTE — Assessment & Plan Note (Signed)
Mild, declines counseling referral, to f/u any worsening symptoms or concerns

## 2016-05-13 NOTE — Assessment & Plan Note (Signed)
For labs today, and o/w cleard for surgury as planned

## 2016-05-16 ENCOUNTER — Other Ambulatory Visit: Payer: Self-pay | Admitting: Internal Medicine

## 2016-06-09 ENCOUNTER — Ambulatory Visit: Payer: Medicare Other | Admitting: Pulmonary Disease

## 2016-06-09 ENCOUNTER — Telehealth (INDEPENDENT_AMBULATORY_CARE_PROVIDER_SITE_OTHER): Payer: Self-pay | Admitting: Orthopaedic Surgery

## 2016-06-09 NOTE — Telephone Encounter (Signed)
I called patient to discuss surgery scheduling and no answer/no voice mail.

## 2016-06-19 ENCOUNTER — Telehealth: Payer: Self-pay

## 2016-06-19 MED ORDER — TIZANIDINE HCL 4 MG PO TABS: ORAL_TABLET | ORAL | 1 refills | Status: DC

## 2016-06-19 NOTE — Telephone Encounter (Signed)
Done erx 

## 2016-06-19 NOTE — Telephone Encounter (Signed)
Pt advised.

## 2016-06-19 NOTE — Telephone Encounter (Signed)
Please advise patient is requesting refill on zanale

## 2016-06-19 NOTE — Addendum Note (Signed)
Addended by: Biagio Borg on: 06/19/2016 03:23 PM   Modules accepted: Orders

## 2016-06-19 NOTE — Telephone Encounter (Signed)
Please advise patient is requesting refill on zanaflex. This was just filled on 05/16/2016 60 pills

## 2016-06-22 ENCOUNTER — Other Ambulatory Visit: Payer: Self-pay | Admitting: Internal Medicine

## 2016-07-10 ENCOUNTER — Ambulatory Visit (INDEPENDENT_AMBULATORY_CARE_PROVIDER_SITE_OTHER): Payer: Medicare Other | Admitting: Internal Medicine

## 2016-07-10 ENCOUNTER — Encounter: Payer: Self-pay | Admitting: Internal Medicine

## 2016-07-10 VITALS — BP 219/86 | HR 71 | Temp 98.1°F | Ht 71.0 in | Wt 253.0 lb

## 2016-07-10 DIAGNOSIS — B182 Chronic viral hepatitis C: Secondary | ICD-10-CM

## 2016-07-10 DIAGNOSIS — K7400 Hepatic fibrosis, unspecified: Secondary | ICD-10-CM

## 2016-07-10 DIAGNOSIS — I639 Cerebral infarction, unspecified: Secondary | ICD-10-CM | POA: Diagnosis not present

## 2016-07-10 NOTE — Progress Notes (Signed)
   Subjective:    Patient ID: Brad [name], male    DOB: 15-May-1956, 60 y.o.   MRN: [id number]  HPI Here for follow up of HCV Has genotype 1, viral load 1.7 million, elastography with F2/3.  Early viral load negative.  Finished 4 months ago.  Feels less fatigue.  Stopped taking BP medication now and bp elevated.     Review of Systems  Constitutional: Negative for fatigue.  Skin: Negative for rash.  Neurological: Negative for dizziness and headaches.       Objective:   Physical Exam  Constitutional: He appears well-developed and well-nourished.  Eyes: No scleral icterus.  Cardiovascular: Normal rate, regular rhythm and normal heart sounds.   Pulmonary/Chest: Breath sounds normal. No respiratory distress.  Skin: No rash noted.          Assessment & Plan:

## 2016-07-10 NOTE — Assessment & Plan Note (Signed)
Albumin is low but no cirrhosis on ultrasound or other concerns.  No inication for [place] screening.

## 2016-07-10 NOTE — Assessment & Plan Note (Signed)
SVR 12 today (4 months).  This is over 99.6% confirming cure if negative.  Should have one last HCV viral load in about 6-12 months via his PCP at next lab visit.   Can return PRN

## 2016-07-11 LAB — HEPATITIS C RNA QUANTITATIVE: HCV Quantitative: NOT DETECTED IU/mL (ref <15)

## 2016-07-16 ENCOUNTER — Other Ambulatory Visit: Payer: Self-pay | Admitting: Internal Medicine

## 2016-07-17 ENCOUNTER — Encounter (HOSPITAL_COMMUNITY): Payer: Medicare Other

## 2016-07-17 ENCOUNTER — Ambulatory Visit: Payer: Medicare Other | Admitting: Family

## 2016-07-27 ENCOUNTER — Other Ambulatory Visit: Payer: Self-pay | Admitting: Internal Medicine

## 2016-09-07 ENCOUNTER — Encounter: Payer: Self-pay | Admitting: Cardiology

## 2016-09-08 ENCOUNTER — Other Ambulatory Visit: Payer: Self-pay | Admitting: Internal Medicine

## 2016-10-07 ENCOUNTER — Other Ambulatory Visit: Payer: Self-pay | Admitting: Internal Medicine

## 2016-10-13 ENCOUNTER — Encounter: Payer: Medicare Other | Admitting: Internal Medicine

## 2016-11-08 ENCOUNTER — Other Ambulatory Visit: Payer: Self-pay | Admitting: Pulmonary Disease

## 2016-11-08 DIAGNOSIS — G4710 Hypersomnia, unspecified: Secondary | ICD-10-CM

## 2016-11-08 DIAGNOSIS — J438 Other emphysema: Secondary | ICD-10-CM

## 2016-11-08 DIAGNOSIS — F17200 Nicotine dependence, unspecified, uncomplicated: Secondary | ICD-10-CM

## 2016-12-27 ENCOUNTER — Other Ambulatory Visit: Payer: Self-pay | Admitting: Internal Medicine

## 2017-01-08 ENCOUNTER — Other Ambulatory Visit: Payer: Self-pay | Admitting: Internal Medicine

## 2017-03-07 ENCOUNTER — Other Ambulatory Visit: Payer: Self-pay | Admitting: Internal Medicine

## 2017-03-13 ENCOUNTER — Ambulatory Visit: Payer: Medicare Other | Admitting: Internal Medicine

## 2017-03-13 ENCOUNTER — Other Ambulatory Visit: Payer: Self-pay | Admitting: Internal Medicine

## 2017-04-06 ENCOUNTER — Other Ambulatory Visit: Payer: Self-pay | Admitting: Internal Medicine

## 2017-04-13 ENCOUNTER — Ambulatory Visit (HOSPITAL_COMMUNITY)
Admission: RE | Admit: 2017-04-13 | Discharge: 2017-04-13 | Disposition: A | Discharge status: Home / Self Care | Payer: Medicare Other | Source: Ambulatory Visit | Attending: Adult Health | Admitting: Adult Health

## 2017-04-13 ENCOUNTER — Encounter: Payer: Self-pay | Admitting: Adult Health

## 2017-04-13 ENCOUNTER — Ambulatory Visit (INDEPENDENT_AMBULATORY_CARE_PROVIDER_SITE_OTHER): Payer: Medicare Other | Admitting: Adult Health

## 2017-04-13 ENCOUNTER — Telehealth: Payer: Self-pay | Admitting: Internal Medicine

## 2017-04-13 ENCOUNTER — Ambulatory Visit (INDEPENDENT_AMBULATORY_CARE_PROVIDER_SITE_OTHER)
Admission: RE | Admit: 2017-04-13 | Discharge: 2017-04-13 | Disposition: A | Discharge status: Home / Self Care | Payer: Medicare Other | Source: Ambulatory Visit | Attending: Adult Health | Admitting: Adult Health

## 2017-04-13 ENCOUNTER — Encounter (HOSPITAL_COMMUNITY): Payer: Self-pay

## 2017-04-13 ENCOUNTER — Telehealth: Payer: Self-pay | Admitting: Pulmonary Disease

## 2017-04-13 DIAGNOSIS — K760 Fatty (change of) liver, not elsewhere classified: Secondary | ICD-10-CM | POA: Diagnosis not present

## 2017-04-13 DIAGNOSIS — R9389 Abnormal findings on diagnostic imaging of other specified body structures: Secondary | ICD-10-CM

## 2017-04-13 DIAGNOSIS — I2510 Atherosclerotic heart disease of native coronary artery without angina pectoris: Secondary | ICD-10-CM | POA: Insufficient documentation

## 2017-04-13 DIAGNOSIS — R590 Localized enlarged lymph nodes: Secondary | ICD-10-CM | POA: Diagnosis not present

## 2017-04-13 DIAGNOSIS — J438 Other emphysema: Secondary | ICD-10-CM | POA: Diagnosis not present

## 2017-04-13 DIAGNOSIS — J90 Pleural effusion, not elsewhere classified: Secondary | ICD-10-CM | POA: Insufficient documentation

## 2017-04-13 DIAGNOSIS — R071 Chest pain on breathing: Secondary | ICD-10-CM

## 2017-04-13 DIAGNOSIS — Z720 Tobacco use: Secondary | ICD-10-CM

## 2017-04-13 DIAGNOSIS — R938 Abnormal findings on diagnostic imaging of other specified body structures: Secondary | ICD-10-CM | POA: Diagnosis not present

## 2017-04-13 DIAGNOSIS — R0602 Shortness of breath: Secondary | ICD-10-CM | POA: Insufficient documentation

## 2017-04-13 DIAGNOSIS — R918 Other nonspecific abnormal finding of lung field: Secondary | ICD-10-CM | POA: Diagnosis not present

## 2017-04-13 DIAGNOSIS — R911 Solitary pulmonary nodule: Secondary | ICD-10-CM | POA: Diagnosis not present

## 2017-04-13 DIAGNOSIS — I700 Atherosclerosis of aorta: Secondary | ICD-10-CM | POA: Insufficient documentation

## 2017-04-13 LAB — POCT I-STAT CREATININE: Creatinine, Ser: 1.9 mg/dL — ABNORMAL HIGH (ref 0.61–1.24)

## 2017-04-13 MED ORDER — IOPAMIDOL (ISOVUE-370) INJECTION 76%
INTRAVENOUS | Status: AC
  Filled 2017-04-13: qty 100

## 2017-04-13 MED ORDER — PREDNISONE 10 MG PO TABS: ORAL_TABLET | ORAL | 0 refills | Status: DC

## 2017-04-13 MED ORDER — AMOXICILLIN-POT CLAVULANATE 875-125 MG PO TABS: 1.0000 | ORAL_TABLET | Freq: Two times a day (BID) | ORAL | 0 refills | Status: AC

## 2017-04-13 NOTE — Telephone Encounter (Signed)
Called patient. No one answered. Will call back later.

## 2017-04-13 NOTE — Assessment & Plan Note (Signed)
Exacerbation   Plan  Patient Instructions  Augmentin twice daily for 1 week, take with food Mucinex DM twice daily as needed for cough and congestion Prednisone taper over the next week. Set up for CT chest. Follow-up in 4 weeks with Dr. Melvyn Novas and as needed Please contact office for sooner follow up if symptoms do not improve or worsen or seek emergency care

## 2017-04-13 NOTE — Telephone Encounter (Signed)
Spoke with the pt  He states 4-5 days ago started having CP  Started when he was lying down and continues to bother him with inspiration  OV with TP with STAT cxr at 2 pm today  He states may be a few min past 2  Aware to sign in and go for cxr

## 2017-04-13 NOTE — Addendum Note (Signed)
Addended by: Georjean Mode on: 04/13/2017 03:17 PM   Modules accepted: Orders

## 2017-04-13 NOTE — Patient Instructions (Addendum)
Augmentin twice daily for 1 week, take with food Mucinex DM twice daily as needed for cough and congestion Prednisone taper over the next week. Set up for CT chest. Follow-up in 4 weeks with Dr. Melvyn Novas and as needed Please contact office for sooner follow up if symptoms do not improve or worsen or seek emergency care

## 2017-04-13 NOTE — Assessment & Plan Note (Signed)
CXR with more prominent area in LUL and Left lung base . Pt with acute sx , weight loss and heavy smoker  Will check CT chest with contrast.

## 2017-04-13 NOTE — Progress Notes (Signed)
@Patient  ID: Brad [name], male    DOB: 07-Feb-1956, 61 y.o.   MRN: [id number]  Chief Complaint  Patient presents with  . Acute Visit    COPD     Referring provider: Biagio Borg, MD  HPI: 61 year old male, heavy smoker seen for pulmonary consult 03/08/2016 for COPD Has OSA but CPAP intolerant  PVD , CVA    Significant tests/ events Echo 09/2015 showed EF of 45% with grade 2 diastolic dysfunction Labs 01/2016 showed hemoglobin 9  Chest x-ray 12/2015 no infiltrates or effusions  Spirometry 03/2016 showed ratio 68, FEV1 of 68% and FVC of 76%  04/13/2017 Acute OV : COPD  Patient presents for an acute office visit. Patient complains of 4 days of cough, congestion , pain on inspiration, thick mucus , wheezing and sob . Patient remains on ANORO . Has not taking over-the-counter medications. Patient denies any increased leg swelling. Remains on Lasix daily. Patient has lost about 15-20 pounds over last year. Patient says he's been under a lot of stress. Over last year with his wife passing away..  Chest x-ray today shows an increased opacity in the left upper lobe. And possible increased left basilar atelectasis. Patient was last seen 03/08/2016 for a pulmonary consult. Continues to smoke one pack of cigarettes a day.  Allergies  Allergen Reactions  . Doxycycline Hives and Itching  . Lipitor [Atorvastatin] Other (See Comments)    Myalgia   . Oxycodone Itching    Immunization History  Administered Date(s) Administered  . Hepatitis B, adult 11/24/2015, 01/18/2016  . Influenza,inj,Quad PF,6+ Mos 04/23/2014, 06/08/2015, 05/10/2016  . Pneumococcal Polysaccharide-23 03/17/2010, 09/25/2014  . Td 03/17/2010  . Tetanus 01/19/2014    Past Medical History:  Diagnosis Date  . Anemia    takes Ferrous Sulfated daily  . Arthritis   . Carotid stenosis 08/27/2014   takes Aspirin daily  . Chronic hepatitis C ([place]) 09/17/2015  . COPD (chronic obstructive pulmonary disease) (HCC)    Albuterol daily as needed and Spiriva daily  . Depression   . Diabetes mellitus type II    takes Metformin and Glipizide daily  . Dizziness   . GERD (gastroesophageal reflux disease)   . GI bleed 12/2015  . Headache    occasionally  . History of blood clots    in right leg;being followed by Dr.Brabham for this  . History of blood transfusion 09/2014   2 units-no abnormal reaction noted  . History of gastric ulcer   . History of hiatal hernia   . HTN (hypertension) 09/14/2015  . Hyperlipidemia 08/27/2014   takes Simvastatin daily  . Hypertension    takes Amlodipine and Lisinopril daily  . Hypothyroidism 08/27/2014   takes Synthroid daily  . Insomnia    takes Pamelor nightly  . Joint pain    hips  . Muscle spasm    takes Zanaflex daily as needed  . MVP (mitral valve prolapse)   . Peripheral edema    takes Furosemide daily as needed  . Peripheral neuropathy   . Prurigo nodularis   . Shortness of breath dyspnea   . Stroke ([place])   . Urinary frequency   . Urinary urgency     Tobacco History: History  Smoking Status  . Current Every Day Smoker  . Packs/day: 1.00  . Years: 48.00  . Types: Cigarettes  Smokeless Tobacco  . Never Used    Comment: 1-2 pks per day   Ready to quit: No Counseling given: Yes  Outpatient Encounter Prescriptions as of 04/13/2017  Medication Sig  . ANORO ELLIPTA 62.5-25 MCG/INH AEPB INHALE 1 PUFF INTO THE LUNGS DAILY  . aspirin EC 81 MG tablet Take 1 tablet (81 mg total) by mouth daily.  . ATROVENT HFA 17 MCG/ACT inhaler INHALE 2 PUFFS INTO THE LUNGS FOUR TIMES DAILY  . Blood Pressure Monitor DEVI Use to check Blood pressure daily  . ferrous sulfate 325 (65 FE) MG tablet Take 1 tablet (325 mg total) by mouth 3 (three) times daily with meals.  . gabapentin (NEURONTIN) 600 MG tablet TAKE 1 TABLET(600 MG) BY MOUTH THREE TIMES DAILY  . glucose blood (ACCU-CHEK AVIVA PLUS) test strip Use to check blood sugars daily Dx E11.9  . Lancets MISC 1 each by  Other route See admin instructions. Reported on 09/07/2015  . potassium chloride (K-DUR) 10 MEQ tablet TAKE 2 TABLETS BY MOUTH EVERY DAY IN THE MORNING  . PROAIR HFA 108 (90 BASE) MCG/ACT inhaler INHALE 2 PUFFS BY MOUTH EVERY 6 HOURS AS NEEDED FOR WHEEZING OR SHORTNESS OF BREATH  . Respiratory Therapy Supplies (NEBULIZER) DEVI Use as directed four times daily  . tiZANidine (ZANAFLEX) 4 MG tablet TAKE 1 TABLET BY MOUTH EVERY 6 HOURS AS NEEDED FOR MUSCLE SPASMS  . tiZANidine (ZANAFLEX) 4 MG tablet Take 1 tablet (4 mg total) by mouth every 6 (six) hours as needed for muscle spasms. **PATIENT NEEDS APPT FOR ADDITIONAL REFILLS**  . amoxicillin-clavulanate (AUGMENTIN) 875-125 MG tablet Take 1 tablet by mouth 2 (two) times daily.  . furosemide (LASIX) 80 MG tablet Take 1 tablet (80 mg total) by mouth daily. (Patient not taking: Reported on 04/13/2017)  . levothyroxine (SYNTHROID, LEVOTHROID) 112 MCG tablet Take 1 tablet (112 mcg total) by mouth daily. (Patient not taking: Reported on 04/13/2017)  . lisinopril (PRINIVIL,ZESTRIL) 20 MG tablet TAKE 1 TABLET(20 MG) BY MOUTH DAILY (Patient not taking: Reported on 04/13/2017)  . predniSONE (DELTASONE) 10 MG tablet 4 tabs for 2 days, then 3 tabs for 2 days, 2 tabs for 2 days, then 1 tab for 2 days, then stop   No facility-administered encounter medications on file as of 04/13/2017.      Review of Systems  Constitutional:   No  weight loss, night sweats,  Fevers, chills,  +fatigue, or  lassitude.  HEENT:   No headaches,  Difficulty swallowing,  Tooth/dental problems, or  Sore throat,                No sneezing, itching, ear ache, nasal congestion, post nasal drip,   CV:  No chest pain,  Orthopnea, PND, swelling in lower extremities, anasarca, dizziness, palpitations, syncope.   GI  No heartburn, indigestion, abdominal pain, nausea, vomiting, diarrhea, change in bowel habits, loss of appetite, bloody stools.   Resp:    No chest wall deformity  Skin: no rash or  lesions.  GU: no dysuria, change in color of urine, no urgency or frequency.  No flank pain, no hematuria   MS:  No joint pain or swelling.  No decreased range of motion.  No back pain.    Physical Exam  BP 118/68 (BP Location: Left Arm, Cuff Size: Normal)   Pulse 71   Ht 5\' 11"  (1.803 m)   Wt 239 lb 3.2 oz (108.5 kg)   SpO2 99%   BMI 33.36 kg/m   GEN: A/Ox3; pleasant , NAD, obese ,    HEENT:  [place]/AT,  EACs-clear, TMs-wnl, NOSE-clear, THROAT-clear, no lesions, no postnasal drip or exudate  noted.   NECK:  Supple w/ fair ROM; no JVD; normal carotid impulses w/o bruits; no thyromegaly or nodules palpated; no lymphadenopathy.    RESP  Few exp wheezes ,  no accessory muscle use, no dullness to percussion Speaks in full sentences  CARD:  RRR, no m/r/g, tr peripheral edema, pulses intact, no cyanosis or clubbing.  GI:   Soft & nt; nml bowel sounds; no organomegaly or masses detected.   Musco: Warm bil, no deformities or joint swelling noted.   Neuro: alert, no focal deficits noted.    Skin: Warm, no lesions or rashes    Lab Results:  CBC    Component Value Date/Time   WBC 9.2 05/10/2016 1434   RBC 5.07 05/10/2016 1434   HGB 13.1 05/10/2016 1434   HCT 38.1 (L) 05/10/2016 1434   PLT 274.0 05/10/2016 1434   MCV 75.1 (L) 05/10/2016 1434   MCH 23.1 (L) 12/09/2015 0353   MCHC 34.3 05/10/2016 1434   RDW 15.5 05/10/2016 1434   LYMPHSABS 1.6 05/10/2016 1434   MONOABS 0.6 05/10/2016 1434   EOSABS 0.2 05/10/2016 1434   BASOSABS 0.1 05/10/2016 1434    BMET    Component Value Date/Time   NA 138 05/10/2016 1434   K 4.2 05/10/2016 1434   CL 104 05/10/2016 1434   CO2 27 05/10/2016 1434   GLUCOSE 88 05/10/2016 1434   BUN 16 05/10/2016 1434   CREATININE 1.01 05/10/2016 1434   CREATININE 1.22 01/25/2016 0922   CALCIUM 8.3 (L) 05/10/2016 1434   GFRNONAA 65 01/25/2016 0922   GFRAA 75 01/25/2016 0922    BNP    Component Value Date/Time   BNP 759.1 (H) 12/08/2015 0722      ProBNP No results found for: PROBNP  Imaging: Dg Chest 2 View  Result Date: 04/13/2017 CLINICAL DATA:  Chronic shortness of breath worse for 1 week EXAM: CHEST  2 VIEW COMPARISON:  12/08/2015, 10/21/2014 FINDINGS: Hyperinflation. No focal consolidation or pleural effusion. Mild scarring in the left lung base and CP angle. Vague opacity in the left upper lung, slightly more prominent compared to previous. Stable cardiomediastinal silhouette. No pneumothorax. Degenerative changes of the spine. IMPRESSION: 1. Hyperinflation with left basilar scarring 2. Vague opacity in the left upper lobe slightly more prominent compared to prior,? possible area of scarring versus nodule. CT suggested for further evaluation 3. Negative for acute edema or infiltrate. Electronically Signed   By: Donavan Foil M.D.   On: 04/13/2017 14:46     Assessment & Plan:   COPD (chronic obstructive pulmonary disease) (HCC) Exacerbation   Plan  Patient Instructions  Augmentin twice daily for 1 week, take with food Mucinex DM twice daily as needed for cough and congestion Prednisone taper over the next week. Set up for CT chest. Follow-up in 4 weeks with Dr. Melvyn Novas and as needed Please contact office for sooner follow up if symptoms do not improve or worsen or seek emergency care        Abnormal chest x-ray CXR with more prominent area in LUL and Left lung base . Pt with acute sx , weight loss and heavy smoker  Will check CT chest with contrast.       Rexene Edison, NP 04/13/2017

## 2017-04-13 NOTE — Addendum Note (Signed)
Addended by: Parke Poisson E on: 04/13/2017 04:44 PM   Modules accepted: Orders

## 2017-04-13 NOTE — Telephone Encounter (Signed)
Ok to [facility], NA

## 2017-04-14 NOTE — Progress Notes (Signed)
Chart and office note reviewed in detail  > agree with a/p as outlined    

## 2017-04-16 ENCOUNTER — Telehealth: Payer: Self-pay

## 2017-04-16 ENCOUNTER — Other Ambulatory Visit: Payer: Self-pay | Admitting: *Deleted

## 2017-04-16 DIAGNOSIS — R911 Solitary pulmonary nodule: Secondary | ICD-10-CM

## 2017-04-16 NOTE — Telephone Encounter (Signed)
Received call report from [facility] radiology in regards to impression 1 and 2 of CT chest that was preformed on 04/13/17. Results are within epic.  TP please advise.  Thanks.

## 2017-04-16 NOTE — Telephone Encounter (Signed)
Called pt letting him know that we needed him to have a PET scan done to further evaluate the results received from his CT scan he had done. Pt expressed understanding. Will place the order for him to have the PET scan done. Nothing further needed at this time.

## 2017-04-16 NOTE — Telephone Encounter (Signed)
Attempted to contact pt. No answer, no option to leave a message. Will try back.  

## 2017-04-16 NOTE — Telephone Encounter (Signed)
Notes recorded by Melvenia Needles, NP on 04/13/2017 at 5:25 PM EDT CT chest shows 2.3 cm lung nodule in LUL >will need further evaluation with PET scan .  Cont with ov recs Called phone , no answer ----------- atc pt again, no answer, no vm.   Will order PET after speaking to pt.

## 2017-04-16 NOTE — Telephone Encounter (Signed)
Can you look at results, I put in on Friday . Tried to call him . Please see the Results and let him know .

## 2017-04-17 ENCOUNTER — Telehealth: Payer: Self-pay | Admitting: Internal Medicine

## 2017-04-17 ENCOUNTER — Ambulatory Visit: Payer: Medicare Other | Admitting: Internal Medicine

## 2017-04-17 NOTE — Telephone Encounter (Signed)
Spoke with pt, he has not heard from anyone about scheduling the PET scan. Can we check on this? I will route to PCC's.

## 2017-04-17 NOTE — Telephone Encounter (Signed)
Error

## 2017-04-17 NOTE — Telephone Encounter (Signed)
Attempted to contact pt x2. No answer, no option to leave a message. Will try back.

## 2017-04-17 NOTE — Telephone Encounter (Signed)
lmam for central scheduling yesterday to call back and Im calling them again but the order is wrong so they are changing it and it will need to be signed again.  appt sched 04/25/17 at 6:30am no eat or drink after midnight and no insulin pt aware

## 2017-04-18 NOTE — Telephone Encounter (Signed)
We have attempted to contact this pt several times with no success. Per triage protocol, message will be closed.

## 2017-04-19 ENCOUNTER — Telehealth: Payer: Self-pay | Admitting: Pulmonary Disease

## 2017-04-19 NOTE — Telephone Encounter (Signed)
Please double book on 9/21 at 9 AM

## 2017-04-19 NOTE — Telephone Encounter (Signed)
-----   Message from Lorretta Harp, Aline sent at 04/19/2017 11:27 AM EDT ----- Pt scheduled to have a PET scan done 04/25/17. Dr. Elsworth Soho, please advise when you want [place] to schedule the pt to come in and see you. Thanks!

## 2017-04-25 ENCOUNTER — Encounter (HOSPITAL_COMMUNITY)
Admission: RE | Admit: 2017-04-25 | Discharge: 2017-04-25 | Disposition: A | Discharge status: Home / Self Care | Payer: Medicare Other | Source: Ambulatory Visit | Attending: Adult Health | Admitting: Adult Health

## 2017-04-25 DIAGNOSIS — R911 Solitary pulmonary nodule: Secondary | ICD-10-CM | POA: Diagnosis not present

## 2017-04-25 LAB — GLUCOSE, CAPILLARY: Glucose-Capillary: 133 mg/dL — ABNORMAL HIGH (ref 65–99)

## 2017-04-25 MED ORDER — FLUDEOXYGLUCOSE F - 18 (FDG) INJECTION: 12.2000 | Freq: Once | INTRAVENOUS | Status: DC

## 2017-04-26 NOTE — Telephone Encounter (Signed)
Spoke with patient. He has been scheduled to see RA on 04/27/17 at 9am.

## 2017-04-27 ENCOUNTER — Ambulatory Visit (INDEPENDENT_AMBULATORY_CARE_PROVIDER_SITE_OTHER): Payer: Medicare Other | Admitting: Pulmonary Disease

## 2017-04-27 ENCOUNTER — Encounter: Payer: Self-pay | Admitting: Pulmonary Disease

## 2017-04-27 VITALS — BP 128/82 | HR 59 | Ht 71.0 in | Wt 237.2 lb

## 2017-04-27 DIAGNOSIS — Z720 Tobacco use: Secondary | ICD-10-CM

## 2017-04-27 DIAGNOSIS — J42 Unspecified chronic bronchitis: Secondary | ICD-10-CM | POA: Diagnosis not present

## 2017-04-27 DIAGNOSIS — Z23 Encounter for immunization: Secondary | ICD-10-CM | POA: Diagnosis not present

## 2017-04-27 DIAGNOSIS — R918 Other nonspecific abnormal finding of lung field: Secondary | ICD-10-CM | POA: Diagnosis not present

## 2017-04-27 NOTE — Assessment & Plan Note (Signed)
Smoking cessation was again strongly emphasized

## 2017-04-27 NOTE — Assessment & Plan Note (Signed)
This is most likely primary lung malignancy with hypermetabolic left upper lobe mass. Interestingly prevascular lymph node did not light up, also there was a small pleural effusion noted on CT which did not light up. His weight loss is concerning for advanced stage lung cancer however his ongoing depression confounds this.  I think best strategy would be to proceed with thoracentesis diagnostic -this will be arranged for next week, riskss and benefits were discussed. If effusion is negative for malignancy, then we can consider surgical resection if he has adequate pulmonary function.  On the other hand if his memory function is inadequate, then we will proceed with navigation biopsy of left upper lobe mass and radiation therapy

## 2017-04-27 NOTE — Assessment & Plan Note (Signed)
Ct Anoro His symptoms of exacerbation of subsided

## 2017-04-27 NOTE — Patient Instructions (Signed)
  Schedule for PFTs. Schedule thoracentesis  (fluid removal from left lung] on Wednesday 9/25 8:30 AM at [facility].  Based on above testing, we will decide what stage of lung cancer and how to proceed  Flu shot today

## 2017-04-27 NOTE — Progress Notes (Signed)
Subjective:    Patient ID: Brad [name], male    DOB: 1956/02/09, 61 y.o.   MRN: [id number]  HPI  61 year old  heavy smoker ,Retired Building control surveyor for [employer] of COPD & lung mass Has OSA but CPAP intolerant  ,other PMH -PVD , CVA  He smokes about 1-1.5 packs per day since her teenage years, more than 81 pack years His wife passed away 2 years ago and he remains depressed He presented with pleuritic left-sided chest pain, no hemoptysis or cough CT chest NO contrast shows 2.3 cm lung nodule in LUL , BL mediastinal LNs, small left effusion & pericardial effusion . Prevascular lymph node 13 mm was noted Incidentally chest x-ray from 12/2015 did not show infiltrates  PET scan showed hypermetabolic left upper lobe mass but mediastinal lymphadenopathy or effusion did not light up   He is lost about 30 pounds in the past year Scans were reviewed with the patient in detail   Significant tests/ events Echo 09/2015 showed EF of 45% with grade 2 diastolic dysfunction  Spirometry 03/2016 showed ratio 68, FEV1 of 68% and FVC of 76%    Past Medical History:  Diagnosis Date  . Anemia    takes Ferrous Sulfated daily  . Arthritis   . Carotid stenosis 08/27/2014   takes Aspirin daily  . Chronic hepatitis C ([place]) 09/17/2015  . COPD (chronic obstructive pulmonary disease) (HCC)    Albuterol daily as needed and Spiriva daily  . Depression   . Diabetes mellitus type II    takes Metformin and Glipizide daily  . Dizziness   . GERD (gastroesophageal reflux disease)   . GI bleed 12/2015  . Headache    occasionally  . History of blood clots    in right leg;being followed by Dr.Brabham for this  . History of blood transfusion 09/2014   2 units-no abnormal reaction noted  . History of gastric ulcer   . History of hiatal hernia   . HTN (hypertension) 09/14/2015  . Hyperlipidemia 08/27/2014   takes Simvastatin daily  . Hypertension    takes Amlodipine and Lisinopril daily  . Hypothyroidism 08/27/2014   takes Synthroid daily  . Insomnia    takes Pamelor nightly  . Joint pain    hips  . Muscle spasm    takes Zanaflex daily as needed  . MVP (mitral valve prolapse)   . Peripheral edema    takes Furosemide daily as needed  . Peripheral neuropathy   . Prurigo nodularis   . Shortness of breath dyspnea   . Stroke ([place])   . Urinary frequency   . Urinary urgency     Review of Systems neg for any significant sore throat, dysphagia, itching, sneezing, nasal congestion or excess/ purulent secretions, fever, chills, sweats, unintended wt loss, pleuritic or exertional cp, hempoptysis, orthopnea pnd or change in chronic leg swelling. Also denies presyncope, palpitations, heartburn, abdominal pain, nausea, vomiting, diarrhea or change in bowel or urinary habits, dysuria,hematuria, rash, arthralgias, visual complaints, headache, numbness weakness or ataxia.     Objective:   Physical Exam  Gen. Pleasant, well-nourished, in no distress ENT - no thrush, no post nasal drip Neck: No JVD, no thyromegaly, no carotid bruits Lungs: no use of accessory muscles, no dullness to percussion, clear without rales or rhonchi  Cardiovascular: Rhythm regular, heart sounds  normal, no murmurs or gallops, no peripheral edema Musculoskeletal: No deformities, no cyanosis or clubbing ,Right shoulder decreased range of motion       Assessment &  Plan:

## 2017-04-30 ENCOUNTER — Other Ambulatory Visit (INDEPENDENT_AMBULATORY_CARE_PROVIDER_SITE_OTHER): Payer: Medicare Other

## 2017-04-30 ENCOUNTER — Telehealth: Payer: Self-pay | Admitting: Pulmonary Disease

## 2017-04-30 ENCOUNTER — Ambulatory Visit (INDEPENDENT_AMBULATORY_CARE_PROVIDER_SITE_OTHER): Payer: Medicare Other | Admitting: Pulmonary Disease

## 2017-04-30 DIAGNOSIS — D649 Anemia, unspecified: Secondary | ICD-10-CM

## 2017-04-30 DIAGNOSIS — J42 Unspecified chronic bronchitis: Secondary | ICD-10-CM

## 2017-04-30 LAB — PULMONARY FUNCTION TEST
DL/VA % pred: 93 %
DL/VA: 4.32 ml/min/mmHg/L
DLCO cor % pred: 80 %
DLCO cor: 25.89 ml/min/mmHg
DLCO unc % pred: 60 %
DLCO unc: 19.46 ml/min/mmHg
FEF 25-75 Post: 1.22 L/sec
FEF 25-75 Pre: 1.1 L/sec
FEF2575-%Change-Post: 10 %
FEF2575-%Pred-Post: 40 %
FEF2575-%Pred-Pre: 36 %
FEV1-%Change-Post: 2 %
FEV1-%Pred-Post: 57 %
FEV1-%Pred-Pre: 55 %
FEV1-Post: 2.08 L
FEV1-Pre: 2.02 L
FEV1FVC-%Change-Post: 1 %
FEV1FVC-%Pred-Pre: 81 %
FEV6-%Change-Post: 1 %
FEV6-%Pred-Post: 71 %
FEV6-%Pred-Pre: 70 %
FEV6-Post: 3.25 L
FEV6-Pre: 3.22 L
FEV6FVC-%Change-Post: 0 %
FEV6FVC-%Pred-Post: 104 %
FEV6FVC-%Pred-Pre: 103 %
FVC-%Change-Post: 0 %
FVC-%Pred-Post: 68 %
FVC-%Pred-Pre: 68 %
FVC-Post: 3.29 L
FVC-Pre: 3.26 L
Post FEV1/FVC ratio: 63 %
Post FEV6/FVC ratio: 99 %
Pre FEV1/FVC ratio: 62 %
Pre FEV6/FVC Ratio: 99 %
RV % pred: 78 %
RV: 1.75 L
TLC % pred: 84 %
TLC: 5.88 L

## 2017-04-30 LAB — CBC
HCT: 31.9 % — ABNORMAL LOW (ref 39.0–52.0)
Hemoglobin: 10.3 g/dL — ABNORMAL LOW (ref 13.0–17.0)
MCHC: 32.3 g/dL (ref 30.0–36.0)
MCV: 73.7 fl — ABNORMAL LOW (ref 78.0–100.0)
Platelets: 340 10*3/uL (ref 150.0–400.0)
RBC: 4.33 Mil/uL (ref 4.22–5.81)
RDW: 17.6 % — ABNORMAL HIGH (ref 11.5–15.5)
WBC: 7.5 10*3/uL (ref 4.0–10.5)

## 2017-04-30 NOTE — Telephone Encounter (Signed)
Pt came in for PFT today and a hgb level was collected with finger sensor. Per finger sensor the pt's HGB level was 8.1 mg/dL. Spoke with Dr. Elsworth Soho and was advised to confirm this reading with a CBC. Informed pt to have blood drawn today prior to leaving. Pt verbalized understanding. Will forward to triage to follow up on.

## 2017-04-30 NOTE — Progress Notes (Signed)
PFT completed today 04/30/17

## 2017-05-01 ENCOUNTER — Encounter (HOSPITAL_COMMUNITY): Payer: Medicare Other

## 2017-05-02 ENCOUNTER — Ambulatory Visit (HOSPITAL_COMMUNITY)
Admission: RE | Admit: 2017-05-02 | Discharge: 2017-05-02 | Disposition: A | Discharge status: Home / Self Care | Payer: Medicare Other | Source: Ambulatory Visit | Attending: Nurse Practitioner | Admitting: Nurse Practitioner

## 2017-05-02 ENCOUNTER — Ambulatory Visit (HOSPITAL_COMMUNITY)
Admission: RE | Admit: 2017-05-02 | Discharge: 2017-05-02 | Disposition: A | Discharge status: Home / Self Care | Payer: Medicare Other | Source: Ambulatory Visit | Attending: Pulmonary Disease | Admitting: Pulmonary Disease

## 2017-05-02 DIAGNOSIS — J90 Pleural effusion, not elsewhere classified: Secondary | ICD-10-CM | POA: Insufficient documentation

## 2017-05-02 DIAGNOSIS — Z9889 Other specified postprocedural states: Secondary | ICD-10-CM | POA: Insufficient documentation

## 2017-05-02 DIAGNOSIS — J9811 Atelectasis: Secondary | ICD-10-CM | POA: Diagnosis not present

## 2017-05-02 DIAGNOSIS — R846 Abnormal cytological findings in specimens from respiratory organs and thorax: Secondary | ICD-10-CM | POA: Diagnosis not present

## 2017-05-02 LAB — PROTEIN, PLEURAL OR PERITONEAL FLUID: Total protein, fluid: <3 g/dL

## 2017-05-02 LAB — LACTATE DEHYDROGENASE, PLEURAL OR PERITONEAL FLUID: LD, Fluid: 55 U/L — ABNORMAL HIGH (ref 3–23)

## 2017-05-02 LAB — BODY FLUID CELL COUNT WITH DIFFERENTIAL
Eos, Fluid: NONE SEEN %
Lymphs, Fluid: 13 %
Monocyte-Macrophage-Serous Fluid: 24 % — ABNORMAL LOW (ref 50–90)
Neutrophil Count, Fluid: 63 % — ABNORMAL HIGH (ref 0–25)
Total Nucleated Cell Count, Fluid: 1424 cu mm — ABNORMAL HIGH (ref 0–1000)

## 2017-05-02 LAB — GLUCOSE, PLEURAL OR PERITONEAL FLUID: Glucose, Fluid: 98 mg/dL

## 2017-05-02 NOTE — Procedures (Addendum)
Thoracentesis Procedure Note  Pre-operative Diagnosis: left pleural effusion, probable lung malignancy  Post-operative Diagnosis: same  Indications: Left pleural effusion  Procedure Details  Consent: Informed consent was obtained. Risks of the procedure were discussed including: infection, bleeding, pain, pneumothorax.  [place] used to mark point for thoracentesis  Under sterile conditions the patient was positioned. Betadine solution and sterile drapes were utilized.  2% buffered lidocaine was used to anesthetize the left 7th rib space. Fluid was obtained without any difficulties and minimal blood loss.  A dressing was applied to the wound and wound care instructions were provided.   Findings 500 ml of clear pleural fluid was obtained. A sample was sent to Pathology for cytology and cell counts, as well as for infection analysis.  Complications:  None; patient tolerated the procedure well.          Condition: stable  Plan A follow up chest x-ray was ordered &no pneumothorax noted Bed Rest for 1 hours. Tylenol 650 mg. for pain.  Attending Attestation: I performed the procedure.  Rigoberto Noel MD

## 2017-05-02 NOTE — Progress Notes (Signed)
Pt came in today for an outpatient thoracentesis by Dr Elsworth Soho. Pt tolerated the procedure very well. VS remained stable throughout. Initial VS- BP 168/71, RR-18, HR-57, SpO2-100% on RA. Post procedure VS BP-165/67, RR-18, HR-53, SpO2-100% on RA. Post Chest xray shows NO pneumothorax.  Pt will be D/C home now.  Kathie Dike RRT

## 2017-05-03 LAB — ACID FAST SMEAR (AFB, MYCOBACTERIA): Acid Fast Smear: NEGATIVE

## 2017-05-04 ENCOUNTER — Telehealth: Payer: Self-pay | Admitting: Pulmonary Disease

## 2017-05-04 NOTE — Telephone Encounter (Signed)
PFTs  - lung function has dropped to 55% Pleural fluid results  >> showed atypical cells suspicious for cancer but not confirmatory  He will need another biopsy procedure I will discuss with radiologist & get back to him next week

## 2017-05-04 NOTE — Telephone Encounter (Signed)
Called pt, no answer and no voicemail. Will try to call back later

## 2017-05-05 LAB — BODY FLUID CULTURE: Culture: NO GROWTH

## 2017-05-07 NOTE — Telephone Encounter (Signed)
Spoke with pt. He is aware of his results. Will route message back to RA.

## 2017-05-07 NOTE — Telephone Encounter (Signed)
Patient is returning phone call. [phone]

## 2017-05-10 ENCOUNTER — Other Ambulatory Visit: Payer: Self-pay

## 2017-05-10 NOTE — Telephone Encounter (Signed)
Discussed during Brad [name]- discussed with patient We'll schedule super D CT chest and then navigation bronchoscopy Risks and benefits and alternative biopsy method-CT-guided needle biopsy were discussed

## 2017-05-10 NOTE — Telephone Encounter (Signed)
Do you want the Super D with contrast or without contrast?

## 2017-05-11 ENCOUNTER — Other Ambulatory Visit: Payer: Self-pay

## 2017-05-11 DIAGNOSIS — J90 Pleural effusion, not elsewhere classified: Secondary | ICD-10-CM

## 2017-05-11 NOTE — Telephone Encounter (Signed)
Order has been placed.

## 2017-05-11 NOTE — Telephone Encounter (Signed)
WITHOUT

## 2017-05-17 ENCOUNTER — Encounter (INDEPENDENT_AMBULATORY_CARE_PROVIDER_SITE_OTHER): Payer: Self-pay

## 2017-05-17 ENCOUNTER — Ambulatory Visit (INDEPENDENT_AMBULATORY_CARE_PROVIDER_SITE_OTHER)
Admission: RE | Admit: 2017-05-17 | Discharge: 2017-05-17 | Disposition: A | Discharge status: Home / Self Care | Payer: Medicare Other | Source: Ambulatory Visit | Attending: Pulmonary Disease | Admitting: Pulmonary Disease

## 2017-05-17 DIAGNOSIS — J90 Pleural effusion, not elsewhere classified: Secondary | ICD-10-CM

## 2017-05-18 ENCOUNTER — Ambulatory Visit: Payer: Medicare Other | Admitting: Pulmonary Disease

## 2017-05-18 ENCOUNTER — Other Ambulatory Visit: Payer: Self-pay | Admitting: Pulmonary Disease

## 2017-05-21 ENCOUNTER — Telehealth: Payer: Self-pay | Admitting: Pulmonary Disease

## 2017-05-21 NOTE — Telephone Encounter (Signed)
CT was performed to facilitate biopsy. Shows slight increase of left upper lobe mass from 23-25 mm We'll proceed with biopsy on Friday as planned

## 2017-05-21 NOTE — Telephone Encounter (Signed)
Pt requesting 10/11 CT results. RA please advise.  Thanks!

## 2017-05-21 NOTE — Telephone Encounter (Signed)
Spoke with patient. He is aware of results. Nothing further needed at time of call.  

## 2017-05-22 NOTE — Pre-Procedure Instructions (Signed)
Brad [name]  05/22/2017      [facility] - [place], [place] - [address] [place] [address] Phone: [phone] Fax: [place], [place] - [address] [place] [address] Phone: [phone] Fax: [phone]    Your procedure is scheduled on Friday, May 25, 2017  Report to [facility] Admitting Entrance "A" at 11:00 A.M.   Call this number if you have problems the morning of surgery:  [phone]   Remember:  Do not eat food or drink liquids after midnight.  Take these medicines the morning of surgery with A SIP OF WATER: Gabapentin (NEURONTIN), ATROVENT HFA inhaler, and ANORO ELLIPTA inhaler. If needed TiZANidine (ZANAFLEX) for muscle spasms.  Follow your doctor's instructions regarding Aspirin.  As of today, stop taking all Aspirins, Vitamins, Fish oils, and Herbal medications. Also stop all NSAIDS i.e. Advil, Ibuprofen, Motrin, Aleve, Anaprox, Naproxen, BC and Goody Powders.  How to Manage Your Diabetes Before and After Surgery Why is it important to control my blood sugar before and after surgery? . Improving blood sugar levels before and after surgery helps healing and can limit problems. . A way of improving blood sugar control is eating a healthy diet by: o  Eating less sugar and carbohydrates o  Increasing activity/exercise o  Talking with your doctor about reaching your blood sugar goals . High blood sugars (greater than 180 mg/dL) can raise your risk of infections and slow your recovery, so you will need to focus on controlling your diabetes during the weeks before surgery. . Make sure that the doctor who takes care of your diabetes knows about your planned surgery including the date and location.  How do I manage my blood sugar before surgery? . Check your blood sugar at least 4 times  a day, starting 2 days before surgery, to make sure that the level is not too high or low. o Check your blood sugar the morning of your surgery when you wake up and every 2 hours until you get to the Short Stay unit. . If your blood sugar is less than 70 mg/dL, you will need to treat for low blood sugar: o Do not take insulin. o Treat a low blood sugar (less than 70 mg/dL) with  cup of clear juice (cranberry or apple), 4 glucose tablets, OR glucose gel. o Recheck blood sugar in 15 minutes after treatment (to make sure it is greater than 70 mg/dL). If your blood sugar is not greater than 70 mg/dL on recheck, call [phone] for further instructions. . Report your blood sugar to the short stay nurse when you get to Short Stay.  . If you are admitted to the hospital after surgery: o Your blood sugar will be checked by the staff and you will probably be given insulin after surgery (instead of oral diabetes medicines) to make sure you have good blood sugar levels. o The goal for blood sugar control after surgery is 80-180 mg/dL.  WHAT DO I DO ABOUT MY DIABETES MEDICATION?  Marland Kitchen Do not take oral diabetes medicines (pills) the morning of surgery.      . If your CBG is greater than 220 mg/dL, call us at [phone]   Do not wear jewelry, make-up or nail polish.  Do not wear lotions, powders, or perfumes, or deoderant.  Do not shave  48 hours prior to surgery.  Men may shave face and neck.  Do not bring valuables to the hospital.  [facility] is not responsible for any belongings or valuables.  Contacts, dentures or bridgework may not be worn into surgery.  Leave your suitcase in the car.  After surgery it may be brought to your room.  For patients admitted to the hospital, discharge time will be determined by your treatment team.  Patients discharged the day of surgery will not be allowed to drive home.   Special instructions:  [facility]- Preparing For Surgery  Before surgery, you can play  an important role. Because skin is not sterile, your skin needs to be as free of germs as possible. You can reduce the number of germs on your skin by washing with CHG (chlorahexidine gluconate) Soap before surgery.  CHG is an antiseptic cleaner which kills germs and bonds with the skin to continue killing germs even after washing.  Please do not use if you have an allergy to CHG or antibacterial soaps. If your skin becomes reddened/irritated stop using the CHG.  Do not shave (including legs and underarms) for at least 48 hours prior to first CHG shower. It is OK to shave your face.  Please follow these instructions carefully.   1. Shower the NIGHT BEFORE SURGERY and the MORNING OF SURGERY with CHG.   2. If you chose to wash your hair, wash your hair first as usual with your normal shampoo.  3. After you shampoo, rinse your hair and body thoroughly to remove the shampoo.  4. Use CHG as you would any other liquid soap. You can apply CHG directly to the skin and wash gently with a scrungie or a clean washcloth.   5. Apply the CHG Soap to your body ONLY FROM THE NECK DOWN.  Do not use on open wounds or open sores. Avoid contact with your eyes, ears, mouth and genitals (private parts). Wash Face and genitals (private parts)  with your normal soap.  6. Wash thoroughly, paying special attention to the area where your surgery will be performed.  7. Thoroughly rinse your body with warm water from the neck down.  8. DO NOT shower/wash with your normal soap after using and rinsing off the CHG Soap.  9. Pat yourself dry with a CLEAN TOWEL.  10. Wear CLEAN PAJAMAS to bed the night before surgery, wear comfortable clothes the morning of surgery  11. Place CLEAN SHEETS on your bed the night of your first shower and DO NOT SLEEP WITH PETS.  Day of Surgery: Do not apply any deodorants/lotions. Please wear clean clothes to the hospital/surgery center.    Please read over the following fact sheets that  you were given.

## 2017-05-23 ENCOUNTER — Encounter (HOSPITAL_COMMUNITY)
Admission: RE | Admit: 2017-05-23 | Discharge: 2017-05-23 | Disposition: A | Discharge status: Home / Self Care | Payer: Medicare Other | Source: Ambulatory Visit | Attending: Pulmonary Disease | Admitting: Pulmonary Disease

## 2017-05-23 ENCOUNTER — Telehealth: Payer: Self-pay | Admitting: Pulmonary Disease

## 2017-05-23 ENCOUNTER — Telehealth: Payer: Self-pay | Admitting: Cardiology

## 2017-05-23 ENCOUNTER — Encounter (HOSPITAL_COMMUNITY): Payer: Self-pay

## 2017-05-23 DIAGNOSIS — R600 Localized edema: Secondary | ICD-10-CM | POA: Diagnosis not present

## 2017-05-23 DIAGNOSIS — Z8719 Personal history of other diseases of the digestive system: Secondary | ICD-10-CM | POA: Diagnosis not present

## 2017-05-23 DIAGNOSIS — B182 Chronic viral hepatitis C: Secondary | ICD-10-CM | POA: Diagnosis not present

## 2017-05-23 DIAGNOSIS — I341 Nonrheumatic mitral (valve) prolapse: Secondary | ICD-10-CM | POA: Diagnosis not present

## 2017-05-23 DIAGNOSIS — Z86718 Personal history of other venous thrombosis and embolism: Secondary | ICD-10-CM | POA: Diagnosis not present

## 2017-05-23 DIAGNOSIS — Z8673 Personal history of transient ischemic attack (TIA), and cerebral infarction without residual deficits: Secondary | ICD-10-CM | POA: Diagnosis not present

## 2017-05-23 DIAGNOSIS — G4700 Insomnia, unspecified: Secondary | ICD-10-CM | POA: Diagnosis not present

## 2017-05-23 DIAGNOSIS — J449 Chronic obstructive pulmonary disease, unspecified: Secondary | ICD-10-CM | POA: Diagnosis not present

## 2017-05-23 DIAGNOSIS — Z7982 Long term (current) use of aspirin: Secondary | ICD-10-CM | POA: Diagnosis not present

## 2017-05-23 DIAGNOSIS — K219 Gastro-esophageal reflux disease without esophagitis: Secondary | ICD-10-CM | POA: Diagnosis not present

## 2017-05-23 DIAGNOSIS — G4733 Obstructive sleep apnea (adult) (pediatric): Secondary | ICD-10-CM | POA: Diagnosis not present

## 2017-05-23 DIAGNOSIS — F329 Major depressive disorder, single episode, unspecified: Secondary | ICD-10-CM | POA: Diagnosis not present

## 2017-05-23 DIAGNOSIS — Z7984 Long term (current) use of oral hypoglycemic drugs: Secondary | ICD-10-CM | POA: Diagnosis not present

## 2017-05-23 DIAGNOSIS — J90 Pleural effusion, not elsewhere classified: Secondary | ICD-10-CM | POA: Diagnosis not present

## 2017-05-23 DIAGNOSIS — I10 Essential (primary) hypertension: Secondary | ICD-10-CM | POA: Diagnosis not present

## 2017-05-23 DIAGNOSIS — E039 Hypothyroidism, unspecified: Secondary | ICD-10-CM | POA: Diagnosis not present

## 2017-05-23 DIAGNOSIS — R918 Other nonspecific abnormal finding of lung field: Secondary | ICD-10-CM | POA: Diagnosis not present

## 2017-05-23 DIAGNOSIS — F17210 Nicotine dependence, cigarettes, uncomplicated: Secondary | ICD-10-CM | POA: Diagnosis not present

## 2017-05-23 DIAGNOSIS — E1142 Type 2 diabetes mellitus with diabetic polyneuropathy: Secondary | ICD-10-CM | POA: Diagnosis not present

## 2017-05-23 DIAGNOSIS — E785 Hyperlipidemia, unspecified: Secondary | ICD-10-CM | POA: Diagnosis not present

## 2017-05-23 DIAGNOSIS — E1151 Type 2 diabetes mellitus with diabetic peripheral angiopathy without gangrene: Secondary | ICD-10-CM | POA: Diagnosis not present

## 2017-05-23 DIAGNOSIS — M1990 Unspecified osteoarthritis, unspecified site: Secondary | ICD-10-CM | POA: Diagnosis not present

## 2017-05-23 LAB — BASIC METABOLIC PANEL
Anion gap: 8 (ref 5–15)
BUN: 18 mg/dL (ref 6–20)
CO2: 26 mmol/L (ref 22–32)
Calcium: 8.4 mg/dL — ABNORMAL LOW (ref 8.9–10.3)
Chloride: 102 mmol/L (ref 101–111)
Creatinine, Ser: 1.26 mg/dL — ABNORMAL HIGH (ref 0.61–1.24)
GFR calc Af Amer: >60 mL/min (ref >60)
GFR calc non Af Amer: >60 mL/min (ref >60)
Glucose, Bld: 101 mg/dL — ABNORMAL HIGH (ref 65–99)
Potassium: 3.3 mmol/L — ABNORMAL LOW (ref 3.5–5.1)
Sodium: 136 mmol/L (ref 135–145)

## 2017-05-23 LAB — GLUCOSE, CAPILLARY: Glucose-Capillary: 95 mg/dL (ref 65–99)

## 2017-05-23 LAB — CBC
HCT: 33.1 % — ABNORMAL LOW (ref 39.0–52.0)
Hemoglobin: 10.7 g/dL — ABNORMAL LOW (ref 13.0–17.0)
MCH: 24.3 pg — ABNORMAL LOW (ref 26.0–34.0)
MCHC: 32.3 g/dL (ref 30.0–36.0)
MCV: 75.2 fL — ABNORMAL LOW (ref 78.0–100.0)
Platelets: 266 10*3/uL (ref 150–400)
RBC: 4.4 MIL/uL (ref 4.22–5.81)
RDW: 16.2 % — ABNORMAL HIGH (ref 11.5–15.5)
WBC: 8.4 10*3/uL (ref 4.0–10.5)

## 2017-05-23 LAB — HEMOGLOBIN A1C
Hgb A1c MFr Bld: 6.2 % — ABNORMAL HIGH (ref 4.8–5.6)
Mean Plasma Glucose: 131.24 mg/dL

## 2017-05-23 NOTE — Progress Notes (Addendum)
Anesthesia PAT Evaluation: Patient is a 61 year old male scheduled for video bronchoscopy with endobronchial ultrasound and endobronchial navigation on 05/25/17 by Dr. Kara Mead. He is a smoker who presented with pleuritic chest pain in early September and was found to have a LUL lung nodule that was hypermetabolic by PET scan. He had a small left pleural effusion and underwent thoracentesis 05/02/17. Pleural fluid biopsies showed atypical cells suspicious for cancer, but not confirmatory.   History includes smoking (1 ppd, down from 2 ppd), COPD, HTN, CVA (multiple, last 09/2015), carotid stenosis (s/p left CEA 10/30/14, right carotid stent 11/30/15), MVP, DM2 (currently off metformin), anemia, hyperlipidemia, hypothyroidism, GERD, hiatal hernia, duodenal ulcers 09/2014, GI bleed, peripheral neuropathy, dizziness, hepatitis C (s/p treatment), peripheral neuropathy, prurigo nodularis. BMI is consistent with obesity. He has lost > 20 pounds over the past year. His wife died of end stage respiratory failure 01/20/16.   - PCP is Dr. [name]. - Pulmonologist is Dr. Kara Mead.  - He saw cardiologist Dr. Tollie Eth in 2016 for preoperative evaluation prior to left CEA for abnormal EKG. It appears he was referred back to cardiology following 09/2015 echo, but patient declined. - Vascular surgeon is Dr. Theotis Burrow. Last saw Dr. Adele Barthel 03/10/16.  - Neurologist is Dr. Rosalin Hawking with [facility]. Last visit 11/12/15. - ID is Dr. Scharlene Gloss. He is not longer seeing following [facility] treatment completion for hepatitis C.  - Orthopedic is Dr. Frankey Shown. He says he needs upcoming left shoulder surgery for torn ligament. Was going to have in Fall of 2017, but had to postpone due to wife's illness and death.   Meds include Anoro Ellipta, ASA 81 mg PRN, Atrovent HFA, ferrous sulfate, Lasix, gabapentin, levothyroxine, K-dur, Zanaflex. He is no longer taking metformin. Reports home CBGs  110's-120's. Patient takes Lasix for edema, but is otherwise not on any antihypertensive medications.  BP (!) 176/65   Pulse 65   Temp 36.5 C (Oral)   Resp 18   Ht 5\' 11"  (1.803 m)   Wt 239 lb 7 oz (108.6 kg)   SpO2 100%   BMI 33.39 kg/m   Patient with somewhat flat affect, but polite and cooperative. He denied exertional chest pain. He has had bilateral pleuritic chest pain with deep breathing since September which currently is less sharp. He denied MI history. Chest CT did show evidence of coronary calcifications. He denied SOB at rest, but does have DOE which has not significantly changes. He has been cutting down on cigarettes. No hemoptysis. He was having fairly significant BLE edema last year, but has been better controlled with daily Lasix. He has not seen Dr. Wynonia Lawman since prior to left CEA. There are hospital and PCP notes from ~ 09/2015 recommending referral to cardiology, but patient deferred. He has not seen vascular surgery in over a year. A few months ago he felt a little dizzy and his RLE shook for a few minutes then subsided. He has had intermittent episodes of both legs shaking for a few minutes, but denied dysarthria, BUE weakness. Today he has no leg or arm weakness. Grips strong, symmetrical. Speech clear. Reports left triceps region stays "sore" since his surgery injury and lower arm is numb. Heart RRR, no murmur. Occasional ectopic beat. Lungs relatively clear. He has 1+ pretibial edema bilaterally.    EKG 05/23/17: NSR, non-specific intra-ventricular conduction block, T wave abnormality, consider inferior ischemia, consider anterolateral ischemia. T wave abnormalities are more pronounced and  ST elevation in V1-2 also noted when compared to 12/08/15 tracing.   Echo 09/29/15: Study Conclusions - Left ventricle: The apex is not adequately visualized to rule out   apical thrombus. The cavity size was normal. There was mild   concentric hypertrophy. Systolic function was mildly  reduced. The   estimated ejection fraction was in the range of 45% to 50%.   Images were inadequate for LV wall motion assessment. Features   are consistent with a pseudonormal left ventricular filling   pattern, with concomitant abnormal relaxation and increased   filling pressure (grade 2 diastolic dysfunction). Doppler   parameters are consistent with high ventricular filling pressure. - Aortic valve: Trileaflet; normal thickness, mildly calcified   leaflets. - Mitral valve: Calcified annulus. There was trivial regurgitation. - Left atrium: The atrium was mildly dilated. - Recommendations: Recommend definity contrast study to rule out   apical thrombus. Consider transesophageal echocardiography if   clinically indicated in order to exclude intracardiac thrombus. Impressions: - No cardiac source of emboli was indentified. Recommendations: 1. Recommend definity contrast study to rule out apical thrombus. 2. Consider transesophageal echocardiography if clinically    indicated in order to exclude intracardiac thrombus. (Comparison echo 09/24/14, EF 16-96%, grade 2 diastolic dysfunction.)  Nuclear stress test 11/05/14 (Dr. Wynonia Lawman; scanned under Media tab, Stress Test 11/11/14): Probably normal Lexiscan Myoview scan with no evidence of ischemia or infarction. Normal quantitative gated SPECT EF of 54% with normal wall motion and wall thickening.  Recommendations: The patient has no evidence of ischemia at this time. This is a low risk scan and from a cardiac viewpoint may proceed with vascular surgery. He will need to have intensive risk factor modification.   Carotid duplex 01/06/16: Impression: Patent right stent with no evidence for restenosis. Patent left carotid endarterectomy with no evidence for restenosis.   CT Super D Chest Wo Contrast 05/17/17: IMPRESSION: 1. 25 x 20 mm spiculated left upper lobe lung lesion slightly enlarged when compared to prior study. 2. Findings suspicious for  endobronchial and or interstitial spread of tumor into the left lung apex. 3. Small stable mediastinal lymph nodes. 4. Stable emphysematous changes. 5. Very small left pleural effusion. 6. Markedly age advanced three-vessel coronary artery calcifications.  PET Scan 04/25/17: IMPRESSION: 1. Hypermetabolic nodule in the LEFT upper lobe is most consistent with primary bronchogenic carcinoma. 2. Mildly enlarged prevascular lymph node with minimal metabolic activity is favored benign.  PFTs 04/30/17: FVC 3.26 (68%), FEV1 2.02 (55%), DLCO unc 19.46 (60%).   Preoperative labs noted. Cr 1.26, down from 1.90 which was newly elevated on 04/13/17. H/H 10.7/33.1. A1c 6.2.  Patient is a smoker with hypermetabolic LUL lung lesion. He has as an abnormal EKG with more pronounced changes since when he saw Dr. Wynonia Lawman in 2016. He does have some chest symptoms but what he describes is non-exertional and more pleuritic in nature. His EF in 2017 had dropped to 45-50% and apical thrombus could not be ruled out, so cardiology follow-up recommended at that time but he declined. His chest CT showed advanced 3V CAD calcifications, although not diagnostic of obstructive disease. BP also not well controlled today. He has had intermittent bilateral leg shaking, but otherwise no dysarthria, unilateral weakness, or upper extremity symptoms. Discussed with anesthesiologist Dr. Nolon Nations. Recommend preoperative cardiology evaluation. I have notified CMA Cherina.   George Hugh [facility] Short Stay Center/Anesthesiology Phone [phone] 05/23/2017 4:02 PM  Addendum: Patient was seen at [facility] this afternoon. Office note by Suanne Marker  Barrett, PA-C is not yet complete, but under patient instructions (Enounters tab) it states, "OK FOR Bronchoscopy." Patient is to follow-up with Dr. Marlou Porch in one month.  George Hugh [facility] Short Stay Center/Anesthesiology Phone [phone] 05/24/2017 4:14 PM

## 2017-05-23 NOTE — Progress Notes (Signed)
PCP - Dr. Winfield Cunas  Cardiologist - Denies  Chest x-ray - 05/02/17 (E)  EKG - 05/23/17  Stress Test - 11/05/14  ECHO - 09/29/15 (E)  Cardiac Cath - Denies  Sleep Study - No CPAP - None  Fasting Blood Sugar -  Checks Blood Sugar __0___ times a day Pt sts he has stopped his blood sugar medicine 6 months ago, as well as his hypertension medicine.  Chart will be given to anesthesia for further review due to abnormal EKG and cardiac history.  Pt denies having chest pain, sob, or fever at this time. All instructions explained to the pt, with a verbal understanding of the material. Pt agrees to go over the instructions while at home for a better understanding. The opportunity to ask questions was provided.

## 2017-05-23 NOTE — Telephone Encounter (Signed)
Please let him know - also I see he saw dr Marlou Porch in 2017 - ask him who is cardiologist is & see if they can see him & clear him tomorrow for low risk procedure

## 2017-05-23 NOTE — Telephone Encounter (Signed)
New Message    Pt has stat appt with Rosaria Ferries 10/18 3p     [facility] Pre-operative Risk Assessment    Request for surgical clearance:  1. What type of surgery is being performed?  Bronchoscopy  2. When is this surgery scheduled?  05/25/17  3. Are there any medications that need to be held prior to surgery and how long? no  4. Practice name and name of physician performing surgery?  Dr Elsworth Soho   5. What is your office phone and fax number? [phone] fax [phone]  6. Anesthesia type (None, local, MAC, general) ?  Not  Specified    Brad [name] 05/23/2017, 4:17 PM  _________________________________________________________________   (provider comments below)

## 2017-05-23 NOTE — Telephone Encounter (Signed)
Spoke with Ebony Hail, she works with the anesthesiologist Dr. Lissa Hoard. Patient came in today for pre-testing for his bronchoscopy schediled on Friday. She stated that based on his cardio history and worsen EKG, Dr. Lissa Hoard recommends that he be seen by a cardiologist before this procedure.   She stated that the patient last saw Dr.Tilley in 2016.   Ebony Hail can be reached at [place] can be reached at [phone]  RA, please advise. Thanks.

## 2017-05-23 NOTE — Telephone Encounter (Signed)
Spoke with patient. He is ok with seeing a cardiologist tomorrow. Will call cardiology to see if I can get him scheduled ASAP.   Patient has been scheduled for a surgical clearance tomorrow with Rosaria Ferries at [facility] at 3pm with Rosaria Ferries.   Patient is aware of appt.   Ebony Hail is aware of appt as well. Fax for the clearance is [phone].

## 2017-05-24 ENCOUNTER — Encounter: Payer: Self-pay | Admitting: Physician Assistant

## 2017-05-24 ENCOUNTER — Telehealth: Payer: Self-pay | Admitting: Physician Assistant

## 2017-05-24 ENCOUNTER — Ambulatory Visit (INDEPENDENT_AMBULATORY_CARE_PROVIDER_SITE_OTHER): Payer: Medicare Other | Admitting: Physician Assistant

## 2017-05-24 VITALS — BP 138/82 | HR 74 | Ht 71.0 in | Wt 243.2 lb

## 2017-05-24 DIAGNOSIS — Z01810 Encounter for preprocedural cardiovascular examination: Secondary | ICD-10-CM | POA: Diagnosis not present

## 2017-05-24 DIAGNOSIS — I10 Essential (primary) hypertension: Secondary | ICD-10-CM | POA: Diagnosis not present

## 2017-05-24 DIAGNOSIS — I739 Peripheral vascular disease, unspecified: Secondary | ICD-10-CM | POA: Diagnosis not present

## 2017-05-24 DIAGNOSIS — I5030 Unspecified diastolic (congestive) heart failure: Secondary | ICD-10-CM

## 2017-05-24 DIAGNOSIS — I5042 Chronic combined systolic (congestive) and diastolic (congestive) heart failure: Secondary | ICD-10-CM

## 2017-05-24 DIAGNOSIS — I6523 Occlusion and stenosis of bilateral carotid arteries: Secondary | ICD-10-CM

## 2017-05-24 DIAGNOSIS — I110 Hypertensive heart disease with heart failure: Secondary | ICD-10-CM

## 2017-05-24 MED ORDER — METOLAZONE 5 MG PO TABS: 5.0000 mg | ORAL_TABLET | Freq: Every day | ORAL | 3 refills | Status: DC

## 2017-05-24 NOTE — Patient Instructions (Signed)
Medication Instructions:  NO CHANGES If you need a refill on your cardiac medications before your next appointment, please call your pharmacy.  Special Instructions: OK FOR Bronchoscopy  Follow-Up: Your physician wants you to follow-up in: 1 [place].   Thank you for choosing [facility] at [facility]!!

## 2017-05-24 NOTE — Telephone Encounter (Signed)
New Message   FYI:   Metolazone is the medication he is taking 1 tablet by mouth daily 5 mg

## 2017-05-24 NOTE — Telephone Encounter (Signed)
Added to today's medication list Per Suanne Marker no call back needed

## 2017-05-24 NOTE — Progress Notes (Signed)
Cardiology Office Note   Date:  05/24/2017   ID:  Brad [name], DOB May 21, 1956, MRN [id number]  PCP:  Biagio Borg, MD  Cardiologist:  Dr. Marlou Porch, 10/21/2015 Rosaria Ferries, PA-C   Chief Complaint  Patient presents with  . Follow-up    Surgical clearance/Seen for Dr. Marlou Porch    History of Present Illness: Brad [name] is a 61 y.o. male with a history of EF 45-50% w/ grade 2 dd 2017, CVA 2017, L-CEA 2016 w/ R carotid stent 2017, anemia, OA, COPD, GERD, DM, HTN, HLD, Hep C, hypothyroid, depression, low-risk MVDone by Dr. Wynonia Lawman (progress note 11/03/2014, no results available).  Brad [name] presents for preoperative cardiology evaluation.   He does work around the yard. He used a weed-eater about a month ago. His riding lawnmower is stiff to steer and hard to start. That is the most strenuous thing he does. It takes about an hour. He was able to clean the gutters this year. He does housework without problems. He does not exercise, but has not noticed any change in his ability to do work around the house. He has no history of exertional chest pain.  His legs still swell, especially during the day, but this is generally controlled by the Lasix 80 mg qd. He has another tablet that he takes whenever the swelling is not controlled by the Lasix. He does not know the name of it, it is expensive, $365 when he gets it filled. He does not have orthopnea or PND. He does not wake with edema.   He developed chest pain w/ inspiration September first. That led to the lung CA dz and need for bx. Until then, his DOE had been stable. He has not had any other chest pain.    Past Medical History:  Diagnosis Date  . Anemia    takes Ferrous Sulfated daily  . Arthritis   . Carotid stenosis 08/27/2014   takes Aspirin daily  . Chronic hepatitis C ([place]) 09/17/2015  . COPD (chronic obstructive pulmonary disease) (HCC)    Albuterol daily as needed and Spiriva daily  . Depression   .  Diabetes mellitus type II    takes Metformin and Glipizide daily  . Dizziness   . GERD (gastroesophageal reflux disease)   . GI bleed 12/2015  . Headache    occasionally  . History of blood clots    in right leg;being followed by Dr.Brabham for this  . History of blood transfusion 09/2014   2 units-no abnormal reaction noted  . History of gastric ulcer   . History of hiatal hernia   . HTN (hypertension) 09/14/2015  . Hyperlipidemia 08/27/2014   takes Simvastatin daily  . Hypertension    takes Amlodipine and Lisinopril daily  . Hypothyroidism 08/27/2014   takes Synthroid daily  . Insomnia    takes Pamelor nightly  . Joint pain    hips  . Muscle spasm    takes Zanaflex daily as needed  . MVP (mitral valve prolapse)   . Peripheral edema    takes Furosemide daily as needed  . Peripheral neuropathy   . Prurigo nodularis   . Shortness of breath dyspnea   . Stroke ([place])   . Urinary frequency   . Urinary urgency     Past Surgical History:  Procedure Laterality Date  . ENDARTERECTOMY Left 11/13/2014   Procedure: ENDARTERECTOMY CAROTID WITH PATCH ANGIOPLASTY;  Surgeon: Serafina Mitchell, MD;  Location: [facility];  Service: Vascular;  Laterality: Left;  . ESOPHAGOGASTRODUODENOSCOPY N/A 09/24/2014   Procedure: ESOPHAGOGASTRODUODENOSCOPY (EGD);  Surgeon: Winfield Cunas., MD;  Location: [facility] ENDOSCOPY;  Service: Endoscopy;  Laterality: N/A;  . I&D of right arm    . INCISION AND DRAINAGE PERIRECTAL ABSCESS  03/2009  . Neg Stress Test  2000  . PERIPHERAL VASCULAR CATHETERIZATION N/A 11/30/2015   Procedure: Carotid PTA/Stent Intervention;  Surgeon: Serafina Mitchell, MD;  Location: [facility] CV LAB;  Service: Cardiovascular;  Laterality: N/A;  . ROTATOR CUFF REPAIR Right 2009   multiple f/u Sxs/infection Tx  . VASCULAR SURGERY      Current Outpatient Prescriptions  Medication Sig Dispense Refill  . ANORO ELLIPTA 62.5-25 MCG/INH AEPB INHALE 1 PUFF INTO THE LUNGS DAILY 1 each 3  . aspirin EC  81 MG tablet Take 1 tablet (81 mg total) by mouth daily. (Patient taking differently: Take 81 mg by mouth daily as needed (headaches). ) 90 tablet 11  . ATROVENT HFA 17 MCG/ACT inhaler INHALE 2 PUFFS INTO THE LUNGS FOUR TIMES DAILY 38.7 g 0  . Blood Pressure Monitor DEVI Use to check Blood pressure daily 1 Device 0  . ferrous sulfate 325 (65 FE) MG tablet Take 1 tablet (325 mg total) by mouth 3 (three) times daily with meals. 30 tablet 3  . furosemide (LASIX) 80 MG tablet Take 1 tablet (80 mg total) by mouth daily. 90 tablet 3  . gabapentin (NEURONTIN) 600 MG tablet TAKE 1 TABLET(600 MG) BY MOUTH THREE TIMES DAILY 270 tablet 1  . glucose blood (ACCU-CHEK AVIVA PLUS) test strip Use to check blood sugars daily Dx E11.9 100 each 3  . Lancets MISC 1 each by Other route See admin instructions. Reported on 09/07/2015    . levothyroxine (SYNTHROID, LEVOTHROID) 112 MCG tablet Take 1 tablet (112 mcg total) by mouth daily. 90 tablet 3  . potassium chloride (K-DUR) 10 MEQ tablet TAKE 2 TABLETS BY MOUTH EVERY DAY IN THE MORNING 180 tablet 0  . Respiratory Therapy Supplies (NEBULIZER) DEVI Use as directed four times daily 1 each 0  . tiZANidine (ZANAFLEX) 4 MG tablet Take 1 tablet (4 mg total) by mouth every 6 (six) hours as needed for muscle spasms. **PATIENT NEEDS APPT FOR ADDITIONAL REFILLS** 60 tablet 0   No current facility-administered medications for this visit.     Allergies:   Doxycycline; Lipitor [atorvastatin]; and Oxycodone    Social History:  The patient  reports that he has been smoking Cigarettes.  He has a 48.00 pack-year smoking history. He has never used smokeless tobacco. He reports that he drinks about 0.6 oz of alcohol per week . He reports that he does not use drugs.   Family History:  The patient's family history includes Alcohol abuse in his father; Diabetes type II in his mother; Heart disease in his mother; Lung cancer in his father, maternal grandmother, and sister.    ROS:   Please see the history of present illness. All other systems are reviewed and negative.    PHYSICAL EXAM: VS:  BP 138/82 (BP Location: Left Arm, Patient Position: Sitting, Cuff Size: Normal)   Pulse 74   Ht 5\' 11"  (1.803 m)   Wt 243 lb 3.2 oz (110.3 kg)   BMI 33.92 kg/m  , BMI Body mass index is 33.92 kg/m. GEN: Well nourished, well developed, male in no acute distress  HEENT: normal for age  Neck: minimal JVD, endarterectomy scar and soft bruit on the right, no carotid bruit on  the left, no masses Cardiac: slightly irreg R&R; soft murmur, no rubs, or gallops Respiratory:  Decreased breath sounds bases bilaterally, normal work of breathing GI: soft, nontender, nondistended, + BS MS: no deformity or atrophy; 1+ lower extremity edema to the mid tibia; distal pulses are 2+ in upper extremities; decreased but palpable in both lower extremities; lower extremity capillary refill is delayed.   Skin: warm and dry, no rash Neuro:  Strength and sensation are intact Psych: euthymic mood, full affect   EKG:  EKG is ordered today. The ekg ordered today demonstrates SR, diffuse inferolateral ST/T wave changes slightly different from 12/2015 and no different from 05/23/2017  ECHO: 09/29/2015 - Left ventricle: The apex is not adequately visualized to rule out   apical thrombus. The cavity size was normal. There was mild   concentric hypertrophy. Systolic function was mildly reduced. The   estimated ejection fraction was in the range of 45% to 50%.   Images were inadequate for LV wall motion assessment. Features   are consistent with a pseudonormal left ventricular filling   pattern, with concomitant abnormal relaxation and increased   filling pressure (grade 2 diastolic dysfunction). Doppler   parameters are consistent with high ventricular filling pressure. - Aortic valve: Trileaflet; normal thickness, mildly calcified leaflets. - Mitral valve: Calcified annulus. There was trivial  regurgitation. - Left atrium: The atrium was mildly dilated. - Recommendations: Recommend definity contrast study to rule out   apical thrombus. Consider transesophageal echocardiography if   clinically indicated in order to exclude intracardiac thrombus. Impressions: - No cardiac source of emboli was indentified. Recommendations: 1. Recommend definity contrast study to rule out apical thrombus. 2. Consider transesophageal echocardiography if clinically    indicated in order to exclude intracardiac thrombus.   Recent Labs: 05/23/2017: BUN 18; Creatinine, Ser 1.26; Hemoglobin 10.7; Platelets 266; Potassium 3.3; Sodium 136    Lipid Panel    Component Value Date/Time   CHOL 189 05/10/2016 1434   TRIG 316.0 (H) 05/10/2016 1434   HDL 27.50 (L) 05/10/2016 1434   CHOLHDL 7 05/10/2016 1434   VLDL 63.2 (H) 05/10/2016 1434   LDLCALC 80 12/07/2015 1522   LDLDIRECT 125.0 05/10/2016 1434     Wt Readings from Last 3 Encounters:  05/24/17 243 lb 3.2 oz (110.3 kg)  05/23/17 239 lb 7 oz (108.6 kg)  04/27/17 237 lb 3.2 oz (107.6 kg)     Other studies Reviewed: Additional studies/ records that were reviewed today include: Office notes, hospital records and testing.  ASSESSMENT AND PLAN:  1.  Preoperative evaluation: His functional status is good. He has never required cardiac revascularization. He has multiple cardiac risk factors including carotid disease, but is not currently having any symptoms. Bronchoscopy is not a high risk procedure. He is at acceptable risk for the planned bronchoscopy without any further evaluation.  Discuss with Dr. Marlou Porch if any stress testing or further evaluation would be needed if the patient requires additional procedures that are more extensive.  2. Chronic combined systolic and diastolic CHF: His weight is up a little bit today, but he admits that he had not taken his Lasix yet today because he was coming here. He stated there is another pill he takes  whenever he is having problems with volume and he took that today. However, he doesn't know what it is. I requested that he call us back with the name of that medication so we can enter into her records. He feels that his respiratory status is at  baseline, I will make no changes in his medications at this time. If he has any issues or concerns, he is to contact us.  3. Bilateral carotid artery stenosis: No symptoms from this, follow-up with [facility]  4. Hypertension: His blood pressure was very high when he first got back care and sat down to 200/90. However, it was rechecked after he had been in the office while it was down to 138/82   Current medicines are reviewed at length with the patient today.  The patient does not have concerns regarding medicines.  The following changes have been made:  no change; call us and let us know the medication that he took this morning  Labs/ tests ordered today include:   Orders Placed This Encounter  Procedures  . EKG 12-Lead     Disposition:   FU with Dr.Skains  Signed, Rosaria Ferries, PA-C  05/24/2017 4:16 PM    [facility] Phone: [phone]; Fax: [phone]  This note was written with the assistance of speech recognition software. Please excuse any transcriptional errors.

## 2017-05-24 NOTE — Telephone Encounter (Signed)
Spoke with patient. He stated that he has been cleared from a cardiology standpoint.  Brad [name] is aware. Nothing else is needed at time of call.

## 2017-05-24 NOTE — Telephone Encounter (Signed)
Patient called stating he has left [facility] and received surgical clearance and okay to go ahead with procedure tomorrow, please call him at [phone]

## 2017-05-25 ENCOUNTER — Ambulatory Visit (HOSPITAL_COMMUNITY): Payer: Medicare Other

## 2017-05-25 ENCOUNTER — Encounter (HOSPITAL_COMMUNITY)
Admission: RE | Disposition: A | Discharge status: Home / Self Care | Payer: Self-pay | Source: Ambulatory Visit | Attending: Pulmonary Disease

## 2017-05-25 ENCOUNTER — Ambulatory Visit (HOSPITAL_COMMUNITY)
Admission: RE | Admit: 2017-05-25 | Discharge: 2017-05-25 | Disposition: A | Discharge status: Home / Self Care | Payer: Medicare Other | Source: Ambulatory Visit | Attending: Pulmonary Disease | Admitting: Pulmonary Disease

## 2017-05-25 ENCOUNTER — Encounter (HOSPITAL_COMMUNITY): Payer: Self-pay | Admitting: *Deleted

## 2017-05-25 ENCOUNTER — Ambulatory Visit (HOSPITAL_COMMUNITY): Payer: Medicare Other | Admitting: Certified Registered"

## 2017-05-25 ENCOUNTER — Ambulatory Visit (HOSPITAL_COMMUNITY): Payer: Medicare Other | Admitting: Vascular Surgery

## 2017-05-25 DIAGNOSIS — J90 Pleural effusion, not elsewhere classified: Secondary | ICD-10-CM | POA: Insufficient documentation

## 2017-05-25 DIAGNOSIS — E119 Type 2 diabetes mellitus without complications: Secondary | ICD-10-CM | POA: Diagnosis not present

## 2017-05-25 DIAGNOSIS — R590 Localized enlarged lymph nodes: Secondary | ICD-10-CM

## 2017-05-25 DIAGNOSIS — R918 Other nonspecific abnormal finding of lung field: Secondary | ICD-10-CM

## 2017-05-25 DIAGNOSIS — Z86718 Personal history of other venous thrombosis and embolism: Secondary | ICD-10-CM | POA: Insufficient documentation

## 2017-05-25 DIAGNOSIS — G4700 Insomnia, unspecified: Secondary | ICD-10-CM | POA: Diagnosis not present

## 2017-05-25 DIAGNOSIS — B182 Chronic viral hepatitis C: Secondary | ICD-10-CM | POA: Insufficient documentation

## 2017-05-25 DIAGNOSIS — F329 Major depressive disorder, single episode, unspecified: Secondary | ICD-10-CM | POA: Insufficient documentation

## 2017-05-25 DIAGNOSIS — I509 Heart failure, unspecified: Secondary | ICD-10-CM | POA: Diagnosis not present

## 2017-05-25 DIAGNOSIS — E785 Hyperlipidemia, unspecified: Secondary | ICD-10-CM | POA: Diagnosis not present

## 2017-05-25 DIAGNOSIS — E1142 Type 2 diabetes mellitus with diabetic polyneuropathy: Secondary | ICD-10-CM | POA: Insufficient documentation

## 2017-05-25 DIAGNOSIS — Z8673 Personal history of transient ischemic attack (TIA), and cerebral infarction without residual deficits: Secondary | ICD-10-CM | POA: Insufficient documentation

## 2017-05-25 DIAGNOSIS — G4733 Obstructive sleep apnea (adult) (pediatric): Secondary | ICD-10-CM | POA: Insufficient documentation

## 2017-05-25 DIAGNOSIS — M1990 Unspecified osteoarthritis, unspecified site: Secondary | ICD-10-CM | POA: Insufficient documentation

## 2017-05-25 DIAGNOSIS — K219 Gastro-esophageal reflux disease without esophagitis: Secondary | ICD-10-CM | POA: Insufficient documentation

## 2017-05-25 DIAGNOSIS — E1151 Type 2 diabetes mellitus with diabetic peripheral angiopathy without gangrene: Secondary | ICD-10-CM | POA: Insufficient documentation

## 2017-05-25 DIAGNOSIS — E039 Hypothyroidism, unspecified: Secondary | ICD-10-CM | POA: Insufficient documentation

## 2017-05-25 DIAGNOSIS — Z419 Encounter for procedure for purposes other than remedying health state, unspecified: Secondary | ICD-10-CM

## 2017-05-25 DIAGNOSIS — Z7984 Long term (current) use of oral hypoglycemic drugs: Secondary | ICD-10-CM | POA: Insufficient documentation

## 2017-05-25 DIAGNOSIS — R848 Other abnormal findings in specimens from respiratory organs and thorax: Secondary | ICD-10-CM | POA: Diagnosis not present

## 2017-05-25 DIAGNOSIS — Z8719 Personal history of other diseases of the digestive system: Secondary | ICD-10-CM | POA: Insufficient documentation

## 2017-05-25 DIAGNOSIS — I10 Essential (primary) hypertension: Secondary | ICD-10-CM | POA: Diagnosis not present

## 2017-05-25 DIAGNOSIS — J449 Chronic obstructive pulmonary disease, unspecified: Secondary | ICD-10-CM | POA: Diagnosis not present

## 2017-05-25 DIAGNOSIS — I341 Nonrheumatic mitral (valve) prolapse: Secondary | ICD-10-CM | POA: Insufficient documentation

## 2017-05-25 DIAGNOSIS — Z7982 Long term (current) use of aspirin: Secondary | ICD-10-CM | POA: Insufficient documentation

## 2017-05-25 DIAGNOSIS — F17210 Nicotine dependence, cigarettes, uncomplicated: Secondary | ICD-10-CM | POA: Insufficient documentation

## 2017-05-25 DIAGNOSIS — I110 Hypertensive heart disease with heart failure: Secondary | ICD-10-CM | POA: Diagnosis not present

## 2017-05-25 DIAGNOSIS — R600 Localized edema: Secondary | ICD-10-CM | POA: Insufficient documentation

## 2017-05-25 HISTORY — PX: VIDEO BRONCHOSCOPY WITH ENDOBRONCHIAL ULTRASOUND: SHX6177

## 2017-05-25 HISTORY — PX: VIDEO BRONCHOSCOPY WITH ENDOBRONCHIAL NAVIGATION: SHX6175

## 2017-05-25 LAB — GLUCOSE, CAPILLARY
Glucose-Capillary: 87 mg/dL (ref 65–99)
Glucose-Capillary: 107 mg/dL — ABNORMAL HIGH (ref 65–99)

## 2017-05-25 LAB — SURGICAL PCR SCREEN
MRSA, PCR: NEGATIVE
Staphylococcus aureus: NEGATIVE

## 2017-05-25 SURGERY — BRONCHOSCOPY, WITH EBUS
Anesthesia: General | Site: Chest

## 2017-05-25 MED ORDER — ONDANSETRON HCL 4 MG/2ML IJ SOLN
INTRAMUSCULAR | Status: DC | PRN
  Administered 2017-05-25: 4 mg via INTRAVENOUS

## 2017-05-25 MED ORDER — PROPOFOL 10 MG/ML IV BOLUS
INTRAVENOUS | Status: DC | PRN
  Administered 2017-05-25: 200 mg via INTRAVENOUS

## 2017-05-25 MED ORDER — PHENYLEPHRINE HCL 10 MG/ML IJ SOLN
INTRAMUSCULAR | Status: DC | PRN
  Administered 2017-05-25: 40 ug/min via INTRAVENOUS

## 2017-05-25 MED ORDER — LACTATED RINGERS IV SOLN
INTRAVENOUS | Status: DC
  Administered 2017-05-25: 12:00:00 via INTRAVENOUS

## 2017-05-25 MED ORDER — HYDRALAZINE HCL 20 MG/ML IJ SOLN
INTRAMUSCULAR | Status: AC
  Administered 2017-05-25: 5 mg via INTRAVENOUS
  Filled 2017-05-25: qty 1

## 2017-05-25 MED ORDER — LIDOCAINE 2% (20 MG/ML) 5 ML SYRINGE
INTRAMUSCULAR | Status: DC | PRN
  Administered 2017-05-25: 100 mg via INTRAVENOUS

## 2017-05-25 MED ORDER — SUCCINYLCHOLINE CHLORIDE 200 MG/10ML IV SOSY
PREFILLED_SYRINGE | INTRAVENOUS | Status: DC | PRN
  Administered 2017-05-25: 120 mg via INTRAVENOUS

## 2017-05-25 MED ORDER — SUCCINYLCHOLINE CHLORIDE 200 MG/10ML IV SOSY
PREFILLED_SYRINGE | INTRAVENOUS | Status: AC
  Filled 2017-05-25: qty 10

## 2017-05-25 MED ORDER — FENTANYL CITRATE (PF) 250 MCG/5ML IJ SOLN
INTRAMUSCULAR | Status: DC | PRN
  Administered 2017-05-25: 50 ug via INTRAVENOUS

## 2017-05-25 MED ORDER — DEXAMETHASONE SODIUM PHOSPHATE 10 MG/ML IJ SOLN
INTRAMUSCULAR | Status: AC
  Filled 2017-05-25: qty 1

## 2017-05-25 MED ORDER — FENTANYL CITRATE (PF) 250 MCG/5ML IJ SOLN
INTRAMUSCULAR | Status: AC
  Filled 2017-05-25: qty 5

## 2017-05-25 MED ORDER — ONDANSETRON HCL 4 MG/2ML IJ SOLN
INTRAMUSCULAR | Status: AC
  Filled 2017-05-25: qty 2

## 2017-05-25 MED ORDER — GLYCOPYRROLATE 0.2 MG/ML IJ SOLN
INTRAMUSCULAR | Status: DC | PRN
  Administered 2017-05-25 (×3): .2 mg via INTRAVENOUS

## 2017-05-25 MED ORDER — MIDAZOLAM HCL 2 MG/2ML IJ SOLN
INTRAMUSCULAR | Status: AC
  Filled 2017-05-25: qty 2

## 2017-05-25 MED ORDER — PROPOFOL 10 MG/ML IV BOLUS
INTRAVENOUS | Status: AC
  Filled 2017-05-25: qty 20

## 2017-05-25 MED ORDER — 0.9 % SODIUM CHLORIDE (POUR BTL) OPTIME
TOPICAL | Status: DC | PRN
  Administered 2017-05-25: 1000 mL

## 2017-05-25 MED ORDER — DEXAMETHASONE SODIUM PHOSPHATE 10 MG/ML IJ SOLN
INTRAMUSCULAR | Status: DC | PRN
  Administered 2017-05-25: 10 mg via INTRAVENOUS

## 2017-05-25 MED ORDER — MUPIROCIN 2 % EX OINT
TOPICAL_OINTMENT | CUTANEOUS | Status: AC
  Administered 2017-05-25: 1
  Filled 2017-05-25: qty 22

## 2017-05-25 MED ORDER — SUGAMMADEX SODIUM 200 MG/2ML IV SOLN
INTRAVENOUS | Status: AC
  Filled 2017-05-25: qty 2

## 2017-05-25 MED ORDER — HYDRALAZINE HCL 20 MG/ML IJ SOLN
5.0000 mg | Freq: Once | INTRAMUSCULAR | Status: AC
  Administered 2017-05-25: 5 mg via INTRAVENOUS

## 2017-05-25 MED ORDER — ROCURONIUM BROMIDE 10 MG/ML (PF) SYRINGE
PREFILLED_SYRINGE | INTRAVENOUS | Status: DC | PRN
  Administered 2017-05-25: 20 mg via INTRAVENOUS
  Administered 2017-05-25: 30 mg via INTRAVENOUS

## 2017-05-25 MED ORDER — MIDAZOLAM HCL 5 MG/5ML IJ SOLN
INTRAMUSCULAR | Status: DC | PRN
  Administered 2017-05-25: 2 mg via INTRAVENOUS

## 2017-05-25 MED ORDER — ROCURONIUM BROMIDE 10 MG/ML (PF) SYRINGE
PREFILLED_SYRINGE | INTRAVENOUS | Status: AC
  Filled 2017-05-25: qty 5

## 2017-05-25 MED ORDER — EPINEPHRINE PF 1 MG/ML IJ SOLN
INTRAMUSCULAR | Status: AC
  Filled 2017-05-25: qty 1

## 2017-05-25 MED ORDER — SUGAMMADEX SODIUM 200 MG/2ML IV SOLN: INTRAVENOUS | Status: DC | PRN

## 2017-05-25 MED ORDER — HYDRALAZINE HCL 20 MG/ML IJ SOLN
5.0000 mg | Freq: Once | INTRAMUSCULAR | Status: AC
Start: 2017-05-25 — End: 2017-05-25
  Administered 2017-05-25: 5 mg via INTRAVENOUS

## 2017-05-25 MED ORDER — SUGAMMADEX SODIUM 200 MG/2ML IV SOLN
INTRAVENOUS | Status: DC | PRN
  Administered 2017-05-25: 220 mg via INTRAVENOUS

## 2017-05-25 SURGICAL SUPPLY — 43 items
ADAPTER BRONCH F/PENTAX (ADAPTER) ×3 IMPLANT
ADPR BSCP EDG PNTX (ADAPTER) ×2
BRUSH CYTOL CELLEBRITY 1.5X140 (MISCELLANEOUS) IMPLANT
BRUSH SUPERTRAX BIOPSY (INSTRUMENTS) IMPLANT
BRUSH SUPERTRAX NDL-TIP CYTO (INSTRUMENTS) IMPLANT
CANISTER SUCT 3000ML PPV (MISCELLANEOUS) ×2 IMPLANT
CHANNEL WORK EXTEND EDGE 180 (KITS) IMPLANT
CHANNEL WORK EXTEND EDGE 45 (KITS) IMPLANT
CHANNEL WORK EXTEND EDGE 90 (KITS) IMPLANT
CONT SPEC 4OZ CLIKSEAL STRL BL (MISCELLANEOUS) ×2 IMPLANT
COVER BACK TABLE 60X90IN (DRAPES) ×2 IMPLANT
COVER DOME SNAP 22 D (MISCELLANEOUS) ×3 IMPLANT
DRAPE C-ARM 42X72 X-RAY (DRAPES) ×2 IMPLANT
FILTER STRAW FLUID ASPIR (MISCELLANEOUS) IMPLANT
FORCEPS BIOP RJ4 1.8 (CUTTING FORCEPS) IMPLANT
FORCEPS BIOP SUPERTRX PREMAR (INSTRUMENTS) ×1 IMPLANT
GAUZE SPONGE 4X4 12PLY STRL (GAUZE/BANDAGES/DRESSINGS) ×2 IMPLANT
GLOVE SURG SIGNA 7.5 PF LTX (GLOVE) ×2 IMPLANT
KIT CLEAN ENDO COMPLIANCE (KITS) ×4 IMPLANT
KIT LOCATABLE GUIDE (CANNULA) IMPLANT
KIT MARKER FIDUCIAL DELIVERY (KITS) IMPLANT
KIT PROCEDURE EDGE 180 (KITS) ×1 IMPLANT
KIT PROCEDURE EDGE 45 (KITS) IMPLANT
KIT PROCEDURE EDGE 90 (KITS) IMPLANT
KIT ROOM TURNOVER OR (KITS) ×2 IMPLANT
MARKER SKIN DUAL TIP RULER LAB (MISCELLANEOUS) ×2 IMPLANT
NDL EBUS SONO TIP PENTAX (NEEDLE) IMPLANT
NDL SUPERTRX PREMARK BIOPSY (NEEDLE) IMPLANT
NEEDLE EBUS SONO TIP PENTAX (NEEDLE) IMPLANT
NEEDLE SUPERTRX PREMARK BIOPSY (NEEDLE) IMPLANT
NS IRRIG 1000ML POUR BTL (IV SOLUTION) ×2 IMPLANT
OIL SILICONE PENTAX (PARTS (SERVICE/REPAIRS)) ×2 IMPLANT
PAD ARMBOARD 7.5X6 YLW CONV (MISCELLANEOUS) ×4 IMPLANT
PATCHES PATIENT (LABEL) ×2 IMPLANT
SYR 20CC LL (SYRINGE) ×2 IMPLANT
SYR 20ML ECCENTRIC (SYRINGE) ×4 IMPLANT
SYR 30ML LL (SYRINGE) ×2 IMPLANT
SYR 5ML LUER SLIP (SYRINGE) ×2 IMPLANT
SYRINGE 20CC LL (MISCELLANEOUS) ×1 IMPLANT
TOWEL OR 17X24 6PK STRL BLUE (TOWEL DISPOSABLE) ×2 IMPLANT
TRAP SPECIMEN MUCOUS 40CC (MISCELLANEOUS) ×3 IMPLANT
TUBE CONNECTING 20X1/4 (TUBING) ×2 IMPLANT
WATER STERILE IRR 1000ML POUR (IV SOLUTION) ×2 IMPLANT

## 2017-05-25 NOTE — H&P (View-Only) (Signed)
Subjective:    Patient ID: Brad [name], male    DOB: [birth year], 61 y.o.   MRN: [id number]  HPI  61 year old  heavy smoker ,Retired Building control surveyor for [employer] of COPD & lung mass Has OSA but CPAP intolerant  ,other PMH -PVD , CVA  He smokes about 1-1.5 packs per day since her teenage years, more than 54 pack years His wife passed away 2 years ago and he remains depressed He presented with pleuritic left-sided chest pain, no hemoptysis or cough CT chest NO contrast shows 2.3 cm lung nodule in LUL , BL mediastinal LNs, small left effusion & pericardial effusion . Prevascular lymph node 13 mm was noted Incidentally chest x-ray from 12/2015 did not show infiltrates  PET scan showed hypermetabolic left upper lobe mass but mediastinal lymphadenopathy or effusion did not light up   He is lost about 30 pounds in the past year Scans were reviewed with the patient in detail   Significant tests/ events Echo 09/2015 showed EF of 45% with grade 2 diastolic dysfunction  Spirometry 03/2016 showed ratio 68, FEV1 of 68% and FVC of 76%    Past Medical History:  Diagnosis Date  . Anemia    takes Ferrous Sulfated daily  . Arthritis   . Carotid stenosis 08/27/2014   takes Aspirin daily  . Chronic hepatitis C ([place]) 09/17/2015  . COPD (chronic obstructive pulmonary disease) (HCC)    Albuterol daily as needed and Spiriva daily  . Depression   . Diabetes mellitus type II    takes Metformin and Glipizide daily  . Dizziness   . GERD (gastroesophageal reflux disease)   . GI bleed 12/2015  . Headache    occasionally  . History of blood clots    in right leg;being followed by Dr.Brabham for this  . History of blood transfusion 09/2014   2 units-no abnormal reaction noted  . History of gastric ulcer   . History of hiatal hernia   . HTN (hypertension) 09/14/2015  . Hyperlipidemia 08/27/2014   takes Simvastatin daily  . Hypertension    takes Amlodipine and Lisinopril daily  . Hypothyroidism 08/27/2014   takes Synthroid daily  . Insomnia    takes Pamelor nightly  . Joint pain    hips  . Muscle spasm    takes Zanaflex daily as needed  . MVP (mitral valve prolapse)   . Peripheral edema    takes Furosemide daily as needed  . Peripheral neuropathy   . Prurigo nodularis   . Shortness of breath dyspnea   . Stroke ([place])   . Urinary frequency   . Urinary urgency     Review of Systems neg for any significant sore throat, dysphagia, itching, sneezing, nasal congestion or excess/ purulent secretions, fever, chills, sweats, unintended wt loss, pleuritic or exertional cp, hempoptysis, orthopnea pnd or change in chronic leg swelling. Also denies presyncope, palpitations, heartburn, abdominal pain, nausea, vomiting, diarrhea or change in bowel or urinary habits, dysuria,hematuria, rash, arthralgias, visual complaints, headache, numbness weakness or ataxia.     Objective:   Physical Exam  Gen. Pleasant, well-nourished, in no distress ENT - no thrush, no post nasal drip Neck: No JVD, no thyromegaly, no carotid bruits Lungs: no use of accessory muscles, no dullness to percussion, clear without rales or rhonchi  Cardiovascular: Rhythm regular, heart sounds  normal, no murmurs or gallops, no peripheral edema Musculoskeletal: No deformities, no cyanosis or clubbing ,Right shoulder decreased range of motion       Assessment &  Plan:

## 2017-05-25 NOTE — Interval H&P Note (Signed)
History and Physical Interval Note:  05/25/2017 12:25 PM  Brad [name]  has presented today for surgery, with the diagnosis of lung mass  The various methods of treatment have been discussed with the patient and family. After consideration of risks, benefits and other options for treatment, the patient has consented to  Procedure(s): VIDEO BRONCHOSCOPY WITH ENDOBRONCHIAL ULTRASOUND (N/A) VIDEO BRONCHOSCOPY WITH ENDOBRONCHIAL NAVIGATION (N/A) as a surgical intervention .  The patient's history has been reviewed, patient examined, no change in status, stable for surgery.  I have reviewed the patient's chart and labs.  Questions were answered to the patient's satisfaction.     Diamante Rubin V.

## 2017-05-25 NOTE — Progress Notes (Signed)
Dr. Orene Desanctis made aware of patient's blood pressure. Received verbal order to give 5 mg of hydralazine and can repeat in 10 minutes.

## 2017-05-25 NOTE — Op Note (Signed)
Video Bronchoscopy with Electromagnetic Navigation Procedure Note  Date of Operation: 05/25/2017  Pre-op Diagnosis: LUL mass  Post-op Diagnosis: LUL mass  Surgeon: Kara Mead MD    Anesthesia: General endotracheal anesthesia  Operation: Flexible video fiberoptic bronchoscopy with electromagnetic navigation and biopsies & EBUS TBNA  Estimated Blood Loss: Minimal  Complications: none  Indications and History: Brad [name] is a 61 y.o. male with LUL mass & pleural effusion.  The risks, benefits, complications, treatment options and expected outcomes were discussed with the patient.  The possibilities of pneumothorax, pneumonia, reaction to medication, pulmonary aspiration, perforation of a viscus, bleeding, failure to diagnose a condition and creating a complication requiring transfusion or operation were discussed with the patient who freely signed the consent.    Description of Procedure: The patient was seen in the Preoperative Area, was examined and was deemed appropriate to proceed.  The patient was taken to OR 10, identified as Brad [name] and the procedure verified as Flexible Video Fiberoptic Bronchoscopy.  A Time Out was held and the above information confirmed.     Prior to the date of the procedure a high-resolution CT scan of the chest was performed. Utilizing [place] a virtual tracheobronchial tree was generated to allow the creation of distinct navigation pathways to the patient's parenchymal abnormalities. After being taken to the operating room general anesthesia was initiated and the patient  was orally intubated. The video fiberoptic bronchoscope was introduced via the endotracheal tube and a general inspection was performed which showed no endobronchial lesions.    Endobronchial ultrasound video bronchoscope was then lubricated and inserted through the endotracheal tube. Surveillance of the mediastinal and and bilateral hilar lymph node  stations was performed.  Pathologically enlarged lymph nodes were noted at 4L.  Endobronchial ultrasound guided transbronchial needle aspiration of 4L (passes 4)was performed, after which EBUS bronchoscope was withdrawn.  Then ENB was started. The extendable working channel and locator guide were introduced into the bronchoscope. The distinct navigation pathways prepared prior to this procedure were then utilized to navigate to within 2 cm of patient's lesion(s) identified on CT scan. The extendable working channel was secured into place and the locator guide was withdrawn. Under fluoroscopic guidance transbronchial needle brushings and transbronchial forceps biopsies were performed to be sent for cytology and pathology. A bronchioalveolar lavage was performed in the LUL and sent for cytology and microbiology (bacterial, fungal, AFB smears and cultures). At the end of the procedure a general airway inspection was performed and there was no evidence of active bleeding. The bronchoscope was removed.  The patient tolerated the procedure well. There was no significant blood loss and there were no obvious complications. A post-procedural chest x-ray is pending.  Samples: 1. Transbronchial needle brushings from LUL 2. Transbronchial Wang needle biopsies from station 4L 3. Transbronchial forceps biopsies from LUL 4. Bronchoalveolar lavage from LUL   Plans:  The patient will be discharged from the PACU to home when recovered from anesthesia and after chest x-ray is reviewed. We will review the cytology, pathology and microbiology results with the patient when they become available. Outpatient followup will be with me.   Rigoberto Noel MD 05/25/2017

## 2017-05-25 NOTE — Telephone Encounter (Signed)
    Chart reviewed as part of pre-operative protocol coverage. Issue already adressed 05/24/17 in OV.  Charlie Pitter, PA-C  05/25/2017, 2:49 PM

## 2017-05-25 NOTE — Anesthesia Preprocedure Evaluation (Addendum)
Anesthesia Evaluation  Patient identified by MRN, date of birth, ID band Patient awake    Reviewed: Allergy & Precautions, NPO status , Patient's Chart, lab work & pertinent test results  History of Anesthesia Complications Negative for: history of anesthetic complications  Airway Mallampati: I  TM Distance: >3 FB Neck ROM: Full    Dental  (+) Edentulous Upper, Edentulous Lower   Pulmonary shortness of breath, COPD, Current Smoker,    breath sounds clear to auscultation+ rhonchi        Cardiovascular hypertension, + Peripheral Vascular Disease and +CHF   Rhythm:Regular Rate:Normal     Neuro/Psych  Neuromuscular disease CVA    GI/Hepatic hiatal hernia, PUD, GERD  ,(+) Hepatitis -  Endo/Other  diabetes, Poorly ControlledMorbid obesity  Renal/GU      Musculoskeletal   Abdominal (+) + obese,   Peds  Hematology   Anesthesia Other Findings   Reproductive/Obstetrics                            Anesthesia Physical Anesthesia Plan  ASA: IV  Anesthesia Plan: General   Post-op Pain Management:    Induction:   PONV Risk Score and Plan: 3 and Ondansetron, Dexamethasone, Midazolam and Propofol infusion  Airway Management Planned: Oral ETT  Additional Equipment:   Intra-op Plan:   Post-operative Plan: Extubation in OR and Possible Post-op intubation/ventilation  Informed Consent: I have reviewed the patients History and Physical, chart, labs and discussed the procedure including the risks, benefits and alternatives for the proposed anesthesia with the patient or authorized representative who has indicated his/her understanding and acceptance.   Dental advisory given  Plan Discussed with: CRNA  Anesthesia Plan Comments: (Multiple poorly controlled medical issues and poor compliance noted)       Anesthesia Quick Evaluation

## 2017-05-25 NOTE — Anesthesia Procedure Notes (Signed)
Procedure Name: Intubation Date/Time: 05/25/2017 1:09 PM Performed by: Freddie Breech Pre-anesthesia Checklist: Patient identified, Emergency Drugs available, Suction available and Patient being monitored Patient Re-evaluated:Patient Re-evaluated prior to induction Oxygen Delivery Method: Circle System Utilized Preoxygenation: Pre-oxygenation with 100% oxygen Induction Type: IV induction Ventilation: Mask ventilation without difficulty Laryngoscope Size: Mac and 4 Grade View: Grade I Tube type: Oral Tube size: 9.0 mm Number of attempts: 1 Airway Equipment and Method: Stylet and Oral airway Placement Confirmation: ETT inserted through vocal cords under direct vision,  positive ETCO2 and breath sounds checked- equal and bilateral Secured at: 23 cm Tube secured with: Tape Dental Injury: Teeth and Oropharynx as per pre-operative assessment

## 2017-05-25 NOTE — Transfer of Care (Signed)
Immediate Anesthesia Transfer of Care Note  Patient: Brad [name]  Procedure(s) Performed: VIDEO BRONCHOSCOPY WITH ENDOBRONCHIAL ULTRASOUND (N/A Chest) VIDEO BRONCHOSCOPY WITH ENDOBRONCHIAL NAVIGATION (N/A Chest)  Patient Location: PACU  Anesthesia Type:General  Level of Consciousness: awake, alert  and oriented  Airway & Oxygen Therapy: Patient Spontanous Breathing and Patient connected to face mask oxygen  Post-op Assessment: Report given to RN and Post -op Vital signs reviewed and stable  Post vital signs: Reviewed and stable  Last Vitals:  Vitals:   05/25/17 1245 05/25/17 1510  BP: (!) 196/67 (!) 143/60  Pulse:  67  Resp:  19  Temp:  36.7 C  SpO2:  100%    Last Pain:  Vitals:   05/25/17 1103  TempSrc: Oral      Patients Stated Pain Goal: 2 (94/07/68 0881)  Complications: No apparent anesthesia complications

## 2017-05-28 ENCOUNTER — Encounter (HOSPITAL_COMMUNITY): Payer: Self-pay | Admitting: Pulmonary Disease

## 2017-05-28 DIAGNOSIS — R590 Localized enlarged lymph nodes: Secondary | ICD-10-CM

## 2017-05-29 ENCOUNTER — Telehealth: Payer: Self-pay | Admitting: Pulmonary Disease

## 2017-05-29 ENCOUNTER — Other Ambulatory Visit: Payer: Self-pay | Admitting: Internal Medicine

## 2017-05-29 DIAGNOSIS — G4710 Hypersomnia, unspecified: Secondary | ICD-10-CM

## 2017-05-29 DIAGNOSIS — J438 Other emphysema: Secondary | ICD-10-CM

## 2017-05-29 DIAGNOSIS — F17200 Nicotine dependence, unspecified, uncomplicated: Secondary | ICD-10-CM

## 2017-05-29 NOTE — Anesthesia Postprocedure Evaluation (Signed)
Anesthesia Post Note  Patient: Brad [name]  Procedure(s) Performed: VIDEO BRONCHOSCOPY WITH ENDOBRONCHIAL ULTRASOUND (N/A Chest) VIDEO BRONCHOSCOPY WITH ENDOBRONCHIAL NAVIGATION (N/A Chest)     Patient location during evaluation: PACU Anesthesia Type: General Level of consciousness: awake and sedated Pain management: pain level controlled Vital Signs Assessment: post-procedure vital signs reviewed and stable Respiratory status: spontaneous breathing, nonlabored ventilation, respiratory function stable and patient connected to nasal cannula oxygen Cardiovascular status: blood pressure returned to baseline and stable Postop Assessment: no apparent nausea or vomiting Anesthetic complications: no    Last Vitals:  Vitals:   05/25/17 1540 05/25/17 1555  BP: 130/61 (!) 131/59  Pulse: 63 61  Resp: 14 17  Temp:  36.7 C  SpO2: 96% 97%    Last Pain:  Vitals:   05/25/17 1555  TempSrc:   PainSc: 0-No pain                 Ercil Cassis,JAMES TERRILL

## 2017-05-29 NOTE — Telephone Encounter (Signed)
Spoke with pot and advised that this procedure usually takes a little longer and we would call him as soon as we receive results and after RA review. RA FYI

## 2017-05-30 MED ORDER — UMECLIDINIUM-VILANTEROL 62.5-25 MCG/INH IN AEPB: 1.0000 | INHALATION_SPRAY | Freq: Every day | RESPIRATORY_TRACT | 3 refills | Status: DC

## 2017-05-30 NOTE — Telephone Encounter (Signed)
Discussed results with patient Brad [name] await discussion in [place] [address] send refill on [facility]

## 2017-05-30 NOTE — Telephone Encounter (Signed)
rx refill sent to pharmacy.  Nothing further needed.

## 2017-06-07 ENCOUNTER — Telehealth: Payer: Self-pay | Admitting: Pulmonary Disease

## 2017-06-07 ENCOUNTER — Encounter: Payer: Self-pay | Admitting: *Deleted

## 2017-06-07 ENCOUNTER — Other Ambulatory Visit: Payer: Self-pay | Admitting: *Deleted

## 2017-06-07 DIAGNOSIS — R918 Other nonspecific abnormal finding of lung field: Secondary | ICD-10-CM

## 2017-06-07 NOTE — Telephone Encounter (Signed)
Discussed in [place] Discussed with pt, pleural effusion neg, TBBX neg, LNs neg on [address] Pl make referral to [facility] - Dr Roxan Hockey or Servando Snare

## 2017-06-08 ENCOUNTER — Telehealth: Payer: Self-pay | Admitting: Pulmonary Disease

## 2017-06-08 MED ORDER — AZITHROMYCIN 250 MG PO TABS: ORAL_TABLET | ORAL | 0 refills | Status: DC

## 2017-06-08 NOTE — Telephone Encounter (Signed)
Called and spoke with pt and he stated that he will not go the the ER and spend all of that money for the cp that he knows a zpak will treat.  The zpak has been sent to the pharmacy and nothing further is needed.

## 2017-06-08 NOTE — Telephone Encounter (Signed)
Patient returned the call, CB is [phone].

## 2017-06-08 NOTE — Telephone Encounter (Signed)
Called and spoke with pt and he stated that he is having right lung pain and tenderness to the touch and this radiates all the way around to his back---he stated that he is coughing and he is able to get up white sputum---he is wanting to see if zpak or another abx can be called in for him.  RA is out of the office today--will forward to MW for further recs.  (pt is being referred to [facility] for lung mass)   Allergies  Allergen Reactions  . Doxycycline Hives and Itching  . Lipitor [Atorvastatin] Other (See Comments)    Myalgia   . Oxycodone Itching   Assessment & Plan Note by Rigoberto Noel, MD at 04/27/2017 9:38 AM   Author: Rigoberto Noel, MD Author Type: Physician Filed: 04/27/2017 9:39 AM  Note Status: Written Cosign: Cosign Not Required Encounter Date: 04/27/2017  Problem: Mass of left lung  Editor: Rigoberto Noel, MD (Physician)    This is most likely primary lung malignancy with hypermetabolic left upper lobe mass. Interestingly prevascular lymph node did not light up, also there was a small pleural effusion noted on CT which did not light up. His weight loss is concerning for advanced stage lung cancer however his ongoing depression confounds this.  I think best strategy would be to proceed with thoracentesis diagnostic -this will be arranged for next week, riskss and benefits were discussed. If effusion is negative for malignancy, then we can consider surgical resection if he has adequate pulmonary function.  On the other hand if his memory function is inadequate, then we will proceed with navigation biopsy of left upper lobe mass and radiation therapy

## 2017-06-08 NOTE — Telephone Encounter (Signed)
ATC patient. No VM. Will keep in triage and attempt to call back later.

## 2017-06-08 NOTE — Telephone Encounter (Signed)
I strongly rec for new cp that he be eval in ER but if declines we can try zpak for the cough component

## 2017-06-11 ENCOUNTER — Other Ambulatory Visit: Payer: Self-pay | Admitting: Internal Medicine

## 2017-06-11 ENCOUNTER — Ambulatory Visit (INDEPENDENT_AMBULATORY_CARE_PROVIDER_SITE_OTHER): Payer: Medicare Other | Admitting: Internal Medicine

## 2017-06-11 ENCOUNTER — Encounter: Payer: Self-pay | Admitting: Internal Medicine

## 2017-06-11 VITALS — BP 180/84 | HR 69 | Temp 98.1°F | Ht 71.0 in | Wt 241.0 lb

## 2017-06-11 DIAGNOSIS — I6523 Occlusion and stenosis of bilateral carotid arteries: Secondary | ICD-10-CM

## 2017-06-11 DIAGNOSIS — I10 Essential (primary) hypertension: Secondary | ICD-10-CM | POA: Diagnosis not present

## 2017-06-11 DIAGNOSIS — E039 Hypothyroidism, unspecified: Secondary | ICD-10-CM

## 2017-06-11 DIAGNOSIS — J42 Unspecified chronic bronchitis: Secondary | ICD-10-CM | POA: Diagnosis not present

## 2017-06-11 DIAGNOSIS — Z23 Encounter for immunization: Secondary | ICD-10-CM | POA: Diagnosis not present

## 2017-06-11 DIAGNOSIS — E785 Hyperlipidemia, unspecified: Secondary | ICD-10-CM

## 2017-06-11 MED ORDER — POTASSIUM CHLORIDE ER 10 MEQ PO TBCR: EXTENDED_RELEASE_TABLET | ORAL | 3 refills | Status: DC

## 2017-06-11 MED ORDER — LISINOPRIL 20 MG PO TABS: 20.0000 mg | ORAL_TABLET | Freq: Every day | ORAL | 3 refills | Status: DC

## 2017-06-11 MED ORDER — TIZANIDINE HCL 4 MG PO TABS
4.0000 mg | ORAL_TABLET | Freq: Four times a day (QID) | ORAL | 2 refills | Status: DC | PRN
Start: 2017-06-11 — End: 2018-11-27

## 2017-06-11 MED ORDER — GABAPENTIN 600 MG PO TABS: ORAL_TABLET | ORAL | 1 refills | Status: DC

## 2017-06-11 MED ORDER — METOLAZONE 5 MG PO TABS: 5.0000 mg | ORAL_TABLET | Freq: Every day | ORAL | 3 refills | Status: DC | PRN

## 2017-06-11 MED ORDER — AMLODIPINE BESYLATE 5 MG PO TABS: 5.0000 mg | ORAL_TABLET | Freq: Every day | ORAL | 3 refills | Status: DC

## 2017-06-11 MED ORDER — FUROSEMIDE 80 MG PO TABS: 80.0000 mg | ORAL_TABLET | Freq: Every day | ORAL | 3 refills | Status: DC

## 2017-06-11 MED ORDER — LEVOTHYROXINE SODIUM 112 MCG PO TABS: 112.0000 ug | ORAL_TABLET | Freq: Every day | ORAL | 3 refills | Status: DC

## 2017-06-11 NOTE — Progress Notes (Signed)
Subjective:    Patient ID: Brad [name], male    DOB: 1956/04/30, 62 y.o.   MRN: [id number]  HPI  Here to f/u after seen 1 yr ago, pt admits he has stopped taking essentially all meds essentially after wfie died 02-10-16, including asa, BP and thyroid meds, all  except for his inhalers and fluid pills BP Readings from Last 3 Encounters:  06/11/17 (!) 180/84  05/25/17 (!) 131/59  05/24/17 138/82  Pt denies chest pain, increased sob or doe, wheezing, orthopnea, PND, increased LE swelling, palpitations, dizziness or syncope.  Pt denies new neurological symptoms such as new headache, or facial or extremity weakness or numbness   Pt denies polydipsia, polyuria. Denies hyper or hypo thyroid symptoms such as voice, skin or hair change. Also states current social situation some difficult as his brother as been living him for last 2wks, drinks quite a bit of ETOH. Past Medical History:  Diagnosis Date  . Anemia    takes Ferrous Sulfated daily  . Arthritis   . Carotid stenosis 08/27/2014   takes Aspirin daily  . Chronic hepatitis C ([place]) 09/17/2015  . COPD (chronic obstructive pulmonary disease) (H[facility])    Albuterol daily as needed and Spiriva daily  . Depression   . Diabetes mellitus type II    takes Metformin and Glipizide daily  . Dizziness   . GERD (gastroesophageal reflux disease)   . GI bleed 12/2015  . Headache    occasionally  . History of blood clots    in right leg;being followed by Dr.Brabham for this  . History of blood transfusion 09/2014   2 units-no abnormal reaction noted  . History of gastric ulcer   . History of hiatal hernia   . HTN (hypertension) 09/14/2015  . Hyperlipidemia 08/27/2014   takes Simvastatin daily  . Hypertension    takes Amlodipine and Lisinopril daily  . Hypothyroidism 08/27/2014   takes Synthroid daily  . Insomnia    takes Pamelor nightly  . Joint pain    hips  . Muscle spasm    takes Zanaflex daily as needed  . MVP (mitral valve prolapse)   .  Peripheral edema    takes Furosemide daily as needed  . Peripheral neuropathy   . Prurigo nodularis   . Shortness of breath dyspnea   . Stroke ([place])   . Urinary frequency   . Urinary urgency    Past Surgical History:  Procedure Laterality Date  . I&D of right arm    . INCISION AND DRAINAGE PERIRECTAL ABSCESS  03/2009  . Neg Stress Test  2000  . ROTATOR CUFF REPAIR Right 2009   multiple f/u Sxs/infection Tx  . VASCULAR SURGERY      reports that he has been smoking cigarettes.  He has a 48.00 pack-year smoking history. he has never used smokeless tobacco. He reports that he drinks about 0.6 oz of alcohol per week. He reports that he does not use drugs. family history includes Alcohol abuse in his father; Diabetes type II in his mother; Heart disease in his mother; Lung cancer in his father, maternal grandmother, and sister. Allergies  Allergen Reactions  . Doxycycline Hives and Itching  . Lipitor [Atorvastatin] Other (See Comments)    Myalgia   . Oxycodone Itching   Current Outpatient Medications on File Prior to Visit  Medication Sig Dispense Refill  . aspirin EC 81 MG tablet Take 1 tablet (81 mg total) by mouth daily. (Patient taking differently: Take 61  mg by mouth daily as needed (headaches). ) 90 tablet 11  . Aspirin-Acetaminophen (GOODYS BODY PAIN PO) Take by mouth.    . ATROVENT HFA 17 MCG/ACT inhaler INHALE 2 PUFFS INTO THE LUNGS FOUR TIMES DAILY 38.7 g 0  . Blood Pressure Monitor DEVI Use to check Blood pressure daily 1 Device 0  . ferrous sulfate 325 (65 FE) MG tablet Take 1 tablet (325 mg total) by mouth 3 (three) times daily with meals. 30 tablet 3  . glucose blood (ACCU-CHEK AVIVA PLUS) test strip Use to check blood sugars daily Dx E11.9 100 each 3  . Lancets MISC 1 each by Other route See admin instructions. Reported on 09/07/2015    . Respiratory Therapy Supplies (NEBULIZER) DEVI Use as directed four times daily 1 each 0  . umeclidinium-vilanterol (ANORO ELLIPTA)  62.5-25 MCG/INH AEPB Inhale 1 puff into the lungs daily. 1 each 3   No current facility-administered medications on file prior to visit.    Review of Systems  Constitutional: Negative for other unusual diaphoresis or sweats HENT: Negative for ear discharge or swelling Eyes: Negative for other worsening visual disturbances Respiratory: Negative for stridor or other swelling  Gastrointestinal: Negative for worsening distension or other blood Genitourinary: Negative for retention or other urinary change Musculoskeletal: Negative for other MSK pain or swelling Skin: Negative for color change or other new lesions Neurological: Negative for worsening tremors and other numbness  Psychiatric/Behavioral: Negative for worsening agitation or other fatigue All other system neg per pt    Objective:   Physical Exam BP (!) 180/84   Pulse 69   Temp 98.1 F (36.7 C) (Oral)   Ht 5\' 11"  (1.803 m)   Wt 241 lb (109.3 kg)   SpO2 100%   BMI 33.61 kg/m  VS noted, not ill appearing Constitutional: Pt appears in NAD HENT: Head: NCAT.  Right Ear: External ear normal.  Left Ear: External ear normal.  Eyes: . Pupils are equal, round, and reactive to light. Conjunctivae and EOM are normal Nose: without d/c or deformity Neck: Neck supple. Gross normal ROM Cardiovascular: Normal rate and regular rhythm.   Pulmonary/Chest: Effort normal and breath sounds decreasewd without rales or wheezing.  Abd:  Soft, NT, ND, + BS, no organomegaly Neurological: Pt is alert. At baseline orientation, motor grossly intact Skin: Skin is warm. No rashes, other new lesions, no LE edema Psychiatric: Pt behavior is normal without agitation, mild depressed nervous affect  No other exam findings    Assessment & Plan:

## 2017-06-11 NOTE — Patient Instructions (Addendum)
You had the Prevnar 13 pneumonia shot today  Please restart your Aspirin 81 mg - 1 per day (coated only)  Please restart all medications, including the blood pressure medications  We do not need to restart the metformin since your recent A1c test was normal, after your weight loss  Please continue all other medications as before, and refills have been done if requested.  Please have the pharmacy call with any other refills you may need.  Please continue your efforts at being more active, low cholesterol diet, and weight control.  You are otherwise up to date with prevention measures today.  Please keep your appointments with your specialists as you may have planned, including follow up with Dr Elsworth Soho and possibly Oncology as well  Please return in 3 months, or sooner if needed

## 2017-06-12 NOTE — Telephone Encounter (Signed)
Referral has been placed. 

## 2017-06-13 NOTE — Assessment & Plan Note (Signed)
Severe uncontrolled, o/w stable overall by history and exam, recent data reviewed with pt, and pt to restart all meds,  to f/u any worsening symptoms or concerns BP Readings from Last 3 Encounters:  06/11/17 (!) 180/84  05/25/17 (!) 131/59  05/24/17 138/82

## 2017-06-13 NOTE — Assessment & Plan Note (Signed)
stable overall by history and exam, recent data reviewed with pt, and pt to continue medical treatment as before,  to f/u any worsening symptoms or concerns, currently seeing pulm with recent abnormal PET scan

## 2017-06-13 NOTE — Assessment & Plan Note (Signed)
stable overall by history and exam, recent data reviewed with pt, and pt to continue medical treatment as before,  to f/u any worsening symptoms or concerns, for f/u lab today

## 2017-06-13 NOTE — Assessment & Plan Note (Signed)
stable overall by history and exam, recent data reviewed with pt, and pt to continue medical treatment as before,  to f/u any worsening symptoms or concerns. For fu lab today, goal ldl < 70

## 2017-06-14 LAB — ACID FAST CULTURE WITH REFLEXED SENSITIVITIES (MYCOBACTERIA): Acid Fast Culture: NEGATIVE

## 2017-06-20 ENCOUNTER — Encounter: Payer: Medicare Other | Admitting: Cardiothoracic Surgery

## 2017-06-21 ENCOUNTER — Institutional Professional Consult (permissible substitution) (INDEPENDENT_AMBULATORY_CARE_PROVIDER_SITE_OTHER): Payer: Medicare Other | Admitting: Cardiothoracic Surgery

## 2017-06-21 ENCOUNTER — Encounter: Payer: Self-pay | Admitting: Cardiothoracic Surgery

## 2017-06-21 ENCOUNTER — Other Ambulatory Visit: Payer: Self-pay

## 2017-06-21 ENCOUNTER — Telehealth: Payer: Self-pay | Admitting: Nurse Practitioner

## 2017-06-21 ENCOUNTER — Other Ambulatory Visit: Payer: Self-pay | Admitting: *Deleted

## 2017-06-21 ENCOUNTER — Ambulatory Visit: Payer: Medicare Other | Admitting: Nurse Practitioner

## 2017-06-21 VITALS — BP 207/85 | HR 66 | Ht 71.0 in | Wt 242.0 lb

## 2017-06-21 DIAGNOSIS — R519 Headache, unspecified: Secondary | ICD-10-CM

## 2017-06-21 DIAGNOSIS — R51 Headache: Secondary | ICD-10-CM

## 2017-06-21 DIAGNOSIS — C7931 Secondary malignant neoplasm of brain: Secondary | ICD-10-CM

## 2017-06-21 DIAGNOSIS — R918 Other nonspecific abnormal finding of lung field: Secondary | ICD-10-CM

## 2017-06-21 DIAGNOSIS — I6523 Occlusion and stenosis of bilateral carotid arteries: Secondary | ICD-10-CM | POA: Diagnosis not present

## 2017-06-21 DIAGNOSIS — C3490 Malignant neoplasm of unspecified part of unspecified bronchus or lung: Secondary | ICD-10-CM

## 2017-06-21 NOTE — Patient Instructions (Addendum)
Pick up your blood pressure medication. You are schedule to see a provider at [facility] today at [place] regarding your blood pressure.

## 2017-06-21 NOTE — Progress Notes (Signed)
[address]       [place], [place] [address]             [phone]                    Beauford L Boudoin [facility] Medical Record #[id number] Date of Birth: 11/21/1955  Referring: Rigoberto Noel, MD Primary Care: Biagio Borg, MD  Chief Complaint:    Chief Complaint  Patient presents with  . New Patient (Initial Visit)    Mass of left lung, CT super D 05/18/2017, PFT 04/30/17, PET 04/25/2017, EBUS 05/25/17    History of Present Illness:    Brad [name] 61 y.o. male is seen in the office  today for for evaluation of left lung mass the patient relates in his history that 2 months ago he was noted increasing shortness of breath and dyspnea on exertion and leg swelling.  He been evaluated by the pulmonary service for a left lung mass and left pleural effusion.  So far PET scan has been done navigation bronchoscopy and thoracentesis all with negative results with exception of atypical cells on the pleural fluid.  The patient has significant underlying pulmonary disease with 55% FEV1, chronic dyspnea, patient notes he is unable to climb stairs without being extremely short of breath. Patient is a long-term smoker smokes at least a pack a day and notes that he will not stop.  We did a 6-minute walk test in the office today he was unable to complete it-stopped after 4 minutes  Adequacy Reason Satisfactory For Evaluation. Diagnosis PLEURAL FLUID, LEFT (SPECIMEN 1 OF 1, COLLECTED ON 05/02/17): ATYPICAL CELLS PRESENT. SEE COMMENT. COMMENT: TISSUE STUDIES SUCH AS A BIOPSY MAY HELP BETTER EVALUATE THE EXTENT AND SEVERITY OF THESE ATYPICAL CELLS. Enid Cutter MD  Current Activity/ Functional Status:  Patient is independent with mobility/ambulation, transfers, ADL's, IADL's.   Zubrod Score: At the time of surgery this patient's most appropriate activity status/level should be described as: []     0    Normal activity, no symptoms []     1    Restricted in physical  strenuous activity but ambulatory, able to do out light work [x]     2    Ambulatory and capable of self care, unable to do work activities, up and about               >50 % of waking hours                              []     3    Only limited self care, in bed greater than 50% of waking hours []     4    Completely disabled, no self care, confined to bed or chair []     5    Moribund   Past Medical History:  Diagnosis Date  . Anemia    takes Ferrous Sulfated daily  . Arthritis   . Carotid stenosis 08/27/2014   takes Aspirin daily  . Chronic hepatitis C ([facility]) 09/17/2015  . COPD (chronic obstructive pulmonary disease) ([facility])    Albuterol daily as needed and Spiriva daily  . Depression   . Diabetes mellitus type II    takes Metformin and Glipizide daily  . Dizziness   . GERD (gastroesophageal reflux disease)   . GI bleed 12/2015  . Headache    occasionally  . History of  blood clots    in right leg;being followed by Dr.Brabham for this  . History of blood transfusion 09/2014   2 units-no abnormal reaction noted  . History of gastric ulcer   . History of hiatal hernia   . HTN (hypertension) 09/14/2015  . Hyperlipidemia 08/27/2014   takes Simvastatin daily  . Hypertension    takes Amlodipine and Lisinopril daily  . Hypothyroidism 08/27/2014   takes Synthroid daily  . Insomnia    takes Pamelor nightly  . Joint pain    hips  . Muscle spasm    takes Zanaflex daily as needed  . MVP (mitral valve prolapse)   . Peripheral edema    takes Furosemide daily as needed  . Peripheral neuropathy   . Prurigo nodularis   . Shortness of breath dyspnea   . Stroke ([facility])   . Urinary frequency   . Urinary urgency     Past Surgical History:  Procedure Laterality Date  . ENDARTERECTOMY Left 11/13/2014   Procedure: ENDARTERECTOMY CAROTID WITH PATCH ANGIOPLASTY;  Surgeon: Serafina Mitchell, MD;  Location: [facility] OR;  Service: Vascular;  Laterality: Left;  . ESOPHAGOGASTRODUODENOSCOPY N/A 09/24/2014    Procedure: ESOPHAGOGASTRODUODENOSCOPY (EGD);  Surgeon: Winfield Cunas., MD;  Location: [facility] ENDOSCOPY;  Service: Endoscopy;  Laterality: N/A;  . I&D of right arm    . INCISION AND DRAINAGE PERIRECTAL ABSCESS  03/2009  . Neg Stress Test  2000  . PERIPHERAL VASCULAR CATHETERIZATION N/A 11/30/2015   Procedure: Carotid PTA/Stent Intervention;  Surgeon: Serafina Mitchell, MD;  Location: [facility] CV LAB;  Service: Cardiovascular;  Laterality: N/A;  . ROTATOR CUFF REPAIR Right 2009   multiple f/u Sxs/infection Tx  . VASCULAR SURGERY    . VIDEO BRONCHOSCOPY WITH ENDOBRONCHIAL NAVIGATION N/A 05/25/2017   Procedure: VIDEO BRONCHOSCOPY WITH ENDOBRONCHIAL NAVIGATION;  Surgeon: Rigoberto Noel, MD;  Location: [facility];  Service: Thoracic;  Laterality: N/A;  . VIDEO BRONCHOSCOPY WITH ENDOBRONCHIAL ULTRASOUND N/A 05/25/2017   Procedure: VIDEO BRONCHOSCOPY WITH ENDOBRONCHIAL ULTRASOUND;  Surgeon: Rigoberto Noel, MD;  Location: [facility] OR;  Service: Thoracic;  Laterality: N/A;    Family History  Problem Relation Age of Onset  . Heart disease Mother        automated implantable cardioverter-defibrillator   . Diabetes type II Mother   . Alcohol abuse Father   . Lung cancer Father   . Lung cancer Maternal Grandmother   . Lung cancer Sister     Social History   Socioeconomic History  . Marital status: Single    Spouse name: Not on file  . Number of children: 0  . Years of education: Not on file  . Highest education level: Not on file  Social Needs  . Financial resource strain: Not on file  . Food insecurity - worry: Not on file  . Food insecurity - inability: Not on file  . Transportation needs - medical: Not on file  . Transportation needs - non-medical: Not on file  Occupational History  . Occupation: Disabled  Tobacco Use  . Smoking status: Current Every Day Smoker    Packs/day: 1.00    Years: 48.00    Pack years: 48.00    Types: Cigarettes  . Smokeless tobacco: Never Used  . Tobacco comment:  1-2 pks per day  Substance and Sexual Activity  . Alcohol use: Yes    Alcohol/week: 0.6 oz    Types: 1 Cans of beer per week    Comment: rarely  . Drug  use: No  . Sexual activity: Yes  Other Topics Concern  . Not on file  Social History Narrative   Divorced with fiance.   Disabled since June 2010 - right shoulder   No children.          Social History   Tobacco Use  Smoking Status Current Every Day Smoker  . Packs/day: 1.00  . Years: 48.00  . Pack years: 48.00  . Types: Cigarettes  Smokeless Tobacco Never Used  Tobacco Comment   1-2 pks per day    Social History   Substance and Sexual Activity  Alcohol Use Yes  . Alcohol/week: 0.6 oz  . Types: 1 Cans of beer per week   Comment: rarely     Allergies  Allergen Reactions  . Doxycycline Hives and Itching  . Lipitor [Atorvastatin] Other (See Comments)    Myalgia   . Oxycodone Itching    Current Outpatient Medications  Medication Sig Dispense Refill  . aspirin EC 81 MG tablet Take 1 tablet (81 mg total) by mouth daily. (Patient taking differently: Take 81 mg by mouth daily as needed (headaches). ) 90 tablet 11  . ATROVENT HFA 17 MCG/ACT inhaler INHALE 2 PUFFS INTO THE LUNGS FOUR TIMES DAILY 38.7 g 0  . Blood Pressure Monitor DEVI Use to check Blood pressure daily 1 Device 0  . ferrous sulfate 325 (65 FE) MG tablet Take 1 tablet (325 mg total) by mouth 3 (three) times daily with meals. 30 tablet 3  . furosemide (LASIX) 80 MG tablet Take 1 tablet (80 mg total) daily by mouth. 90 tablet 3  . gabapentin (NEURONTIN) 600 MG tablet TAKE 1 TABLET(600 MG) BY MOUTH THREE TIMES DAILY 270 tablet 1  . glucose blood (ACCU-CHEK AVIVA PLUS) test strip Use to check blood sugars daily Dx E11.9 100 each 3  . Lancets MISC 1 each by Other route See admin instructions. Reported on 09/07/2015    . levothyroxine (SYNTHROID, LEVOTHROID) 112 MCG tablet Take 1 tablet (112 mcg total) daily by mouth. 90 tablet 3  . lisinopril  (PRINIVIL,ZESTRIL) 20 MG tablet Take 1 tablet (20 mg total) daily by mouth. 90 tablet 3  . metolazone (ZAROXOLYN) 5 MG tablet Take 1 tablet (5 mg total) daily as needed by mouth. 90 tablet 3  . potassium chloride (K-DUR) 10 MEQ tablet TAKE 2 TABLETS BY MOUTH EVERY DAY IN THE MORNING 180 tablet 3  . Respiratory Therapy Supplies (NEBULIZER) DEVI Use as directed four times daily 1 each 0  . tiZANidine (ZANAFLEX) 4 MG tablet Take 1 tablet (4 mg total) every 6 (six) hours as needed by mouth for muscle spasms. 60 tablet 2  . umeclidinium-vilanterol (ANORO ELLIPTA) 62.5-25 MCG/INH AEPB Inhale 1 puff into the lungs daily. 1 each 3  . amLODipine (NORVASC) 5 MG tablet Take 1 tablet (5 mg total) daily by mouth. (Patient not taking: Reported on 06/21/2017) 90 tablet 3   No current facility-administered medications for this visit.     Pertinent items are noted in HPI.   Review of Systems:     Cardiac Review of Systems: Y or N  Chest Pain [  y  ]  Resting SOB [ y  ] Exertional SOB  Blue.Reese  ]  Orthopnea [ y ]   Pedal Edema Blue.Reese   ]    Palpitations [ n ] Syncope  [ n ]   Presyncope [n   ]  General Review of Systems: [Y] = yes [  ]=no Constitional:  recent weight change [  ];  Wt loss over the last 3 months [   ] anorexia [  ]; fatigue [ y ]; nausea [  ]; night sweats [  ]; fever [  ]; or chills [  ];          Dental: poor dentition[  ]; Last Dentist visit:   Eye : blurred vision [  ]; diplopia [   ]; vision changes [  ];  Amaurosis fugax[  ]; Resp: cough [  ];  wheezing[y  ];  hemoptysis[ n ]; shortness of breath[y  ]; paroxysmal nocturnal dyspnea[y  ]; dyspnea on exertion[ y ]; or orthopnea[ y ];  GI:  gallstones[  ], vomiting[  ];  dysphagia[  ]; melena[  ];  hematochezia [  ]; heartburn[  ];   Hx of  Colonoscopy[  ]; GU: kidney stones [  ]; hematuria[  ];   dysuria [  ];  nocturia[  ];  history of     obstruction [  ]; urinary frequency [  ]             Skin: rash, swelling[  ];, hair loss[  ];  peripheral  edema[  ];  or itching[  ]; Musculosketetal: myalgias[  ];  joint swelling[  ];  joint erythema[  ];  joint pain[  ];  back pain[  ];  Heme/Lymph: bruising[  ];  bleeding[  ];  anemia[  ];  Neuro: TIA[ n ];  headaches[  ];  stroke[  ];  vertigo[  ];  seizures[  ];   paresthesias[  ];  difficulty walking[  ];  Psych:depression[  ]; anxiety[  ];  Endocrine: diabetes[y  ];  thyroid dysfunction[ y ];  Immunizations: Flu up to date Blue.Reese ]; Pneumococcal up to date [  n];  Other:  Physical Exam: BP (!) 207/85 (BP Location: Left Arm, Patient Position: Sitting, Cuff Size: Normal) Comment: has not had medication, denies headache  Pulse 66   Ht 5\' 11"  (1.803 m)   Wt 242 lb (109.8 kg)   SpO2 98%   BMI 33.75 kg/m   PHYSICAL EXAMINATION: General appearance: alert, cooperative, appears older than stated age and no distress Head: Normocephalic, without obvious abnormality, atraumatic Neck: no adenopathy, no carotid bruit, no JVD, supple, symmetrical, trachea midline and thyroid not enlarged, symmetric, no tenderness/mass/nodules Lymph nodes: Cervical, supraclavicular, and axillary nodes normal. Resp: clear to auscultation bilaterally Back: symmetric, no curvature. ROM normal. No CVA tenderness. Cardio: regular rate and rhythm, S1, S2 normal, no murmur, click, rub or gallop GI: soft, non-tender; bowel sounds normal; no masses,  no organomegaly Extremities: extremities normal, atraumatic, no cyanosis or edema and Homans sign is negative, no sign of DVT Neurologic: Grossly normal   6 Minute Walk Test Results  Patient: KENITH TRICKEL Date:  06/21/2017   Supplemental O2 during test? No      Baseline   End  Time   1006am   1012am Heartrate  66    81 Dyspnea  0    3 Fatigue  0    4 O2 sat   98%    98%        Patient ambulated at a slow pace for a total distance of 490 feet with 1 stops.  Ambulation was limited primarily due to calf pain      Diagnostic Studies & Laboratory data:      Recent Radiology Findings:   Dg Chest Port 1  View  Result Date: 05/25/2017 CLINICAL DATA:  Left lung biopsy. EXAM: PORTABLE CHEST 1 VIEW COMPARISON:  Chest x-ray 05/02/2017.  Chest CT 05/17/2017 . FINDINGS: Mediastinum hilar structures normal. Cardiomegaly with normal pulmonary vascularity. Persistent density left upper lobe with adjacent pleuroparenchymal thickening. No prominent pleural effusion. No pneumothorax IMPRESSION: No evidence of pneumothorax post biopsy. Left upper lobe density again noted. Electronically Signed   By: [place]   On: 05/25/2017 15:44  CLINICAL DATA:  Super D chest for bronchoscopic biopsy.  EXAM: CT CHEST WITHOUT CONTRAST  TECHNIQUE: Multidetector CT imaging of the chest was performed using thin slice collimation for electromagnetic bronchoscopy planning purposes, without intravenous contrast.  COMPARISON:  Chest CT 04/13/2017 and PET-CT 04/25/2017  FINDINGS: Cardiovascular: The heart is normal in size. No pericardial effusion. Mild pericardial thickening. The aorta is normal in caliber. Age advanced atherosclerotic calcifications. Markedly age advanced three-vessel coronary artery calcifications. A  Mediastinum/Nodes: Stable 9.5 mm pretracheal lymph node on image number 59. Stable 12 mm prevascular lymph node on image number 51. Small subcarinal nodes are stable.  Lungs/Pleura: 25 x 20 mm spiculated left upper lobe lung lesion, slightly enlarged when compared to prior study where it measured 23 x 17 mm. Findings suspicious for endobronchial and/or interstitial spread of tumor into the left lung apex.  Stable emphysematous changes. No other pulmonary nodules. Bibasilar scarring changes hand subpleural atelectasis.  Upper Abdomen: No significant upper abdominal findings. No obvious hepatic or adrenal gland metastasis.  Musculoskeletal: No chest wall mass, supraclavicular or axillary adenopathy. The thyroid gland is normal except  for a few tiny nodules.  No significant bony findings.  IMPRESSION: 1. 25 x 20 mm spiculated left upper lobe lung lesion slightly enlarged when compared to prior study. 2. Findings suspicious for endobronchial and or interstitial spread of tumor into the left lung apex. 3. Small stable mediastinal lymph nodes. 4. Stable emphysematous changes. 5. Very small left pleural effusion.  Aortic Atherosclerosis (ICD10-I70.0) and Emphysema (ICD10-J43.9).   Electronically Signed   By: Marijo Sanes M.D.   On: 05/18/2017 08:38 CLINICAL DATA:  Initial treatment strategy for lung nodule.  EXAM: NUCLEAR MEDICINE PET SKULL BASE TO THIGH  TECHNIQUE: 12.2 mCi F-18 FDG was injected intravenously. Full-ring PET imaging was performed from the skull base to thigh after the radiotracer. CT data was obtained and used for attenuation correction and anatomic localization.  FASTING BLOOD GLUCOSE:  Value: 132 mg/dl  COMPARISON:  04/13/2017  FINDINGS: NECK  No hypermetabolic lymph nodes in the neck.  CHEST  Within the LEFT upper lobe, 2.5 cm lobular nodule has intense metabolic activity SUV max equal 10.5.  No additional hypermetabolic pulmonary nodules.  No clearly hypermetabolic mediastinal lymph nodes. Prevascular lymph node measuring 10 mm short axis (image 68, series 4) has metabolic activity similar to mediastinal activity with SUV max equal 2.3.  ABDOMEN/PELVIS  No abnormal hypermetabolic activity within the liver, pancreas, adrenal glands, or spleen. No hypermetabolic lymph nodes in the abdomen or pelvis.  Mixed density lesions of the kidney without associated metabolic activity.  SKELETON  No focal hypermetabolic activity to suggest skeletal metastasis.  IMPRESSION: 1. Hypermetabolic nodule in the LEFT upper lobe is most consistent with primary bronchogenic carcinoma. 2. Mildly enlarged prevascular lymph node with minimal metabolic activity is  favored benign. If multidisciplinary follow up management is desired, this is available in the [facility] through the Multidisciplinary Thoracic Clinic [phone].  These results will be called to the ordering clinician or representative by the  Psychologist, clinical, and communication documented in the PACS or zVision Dashboard.   Electronically Signed   By: Suzy Bouchard M.D.   On: 04/25/2017 10:47    I have independently reviewed the above radiologic studies.  Recent Lab Findings: Lab Results  Component Value Date   WBC 8.4 05/23/2017   HGB 10.7 (L) 05/23/2017   HCT 33.1 (L) 05/23/2017   PLT 266 05/23/2017   GLUCOSE 101 (H) 05/23/2017   CHOL 189 05/10/2016   TRIG 316.0 (H) 05/10/2016   HDL 27.50 (L) 05/10/2016   LDLDIRECT 125.0 05/10/2016   LDLCALC 80 12/07/2015   ALT 15 05/10/2016   AST 20 05/10/2016   NA 136 05/23/2017   K 3.3 (L) 05/23/2017   CL 102 05/23/2017   CREATININE 1.26 (H) 05/23/2017   BUN 18 05/23/2017   CO2 26 05/23/2017   TSH 5.09 (H) 05/10/2016   INR 1.19 12/08/2015   HGBA1C 6.2 (H) 05/23/2017    Chronic Kidney Disease   Stage I     GFR >90  Stage II    GFR 60-89  Stage IIIA GFR 45-59  Stage IIIB GFR 30-44  Stage IV   GFR 15-29  Stage V    GFR  <15  Lab Results  Component Value Date   CREATININE 1.26 (H) 05/23/2017   CrCl cannot be calculated (Patient's most recent lab result is older than the maximum 21 days allowed.). PFT" FEV! 2.22 55% DLCO19.46 60% Conclusions: Moderately severe airway obstruction is present suggesting small airway disease. A clinical trial of bronchodilators may be beneficial in view of the airway obstruction. Pulmonary Function Diagnosis: Moderately severe Obstructive Airways Disease Restriction -Possible  Assessment / Plan:   1/Primary carcinoma of the lung radiographically clinical T3 (?  Satellite nodules left upper lobe) primary 2.5 cm, negative nodes by PET scan.  Clinical stage IIb in a  patient with marginal functional status continues to smoke and unable to complete a 6-minute walk test.  I discussed with the patient continuing his workup, by obtaining MRI of the brain, repeat navigation bronchoscopy with placement of fiducials, consultation in the multidisciplinary thoracic oncology clinic before making a definitive treatment recommendation.  2/left effusion-atypical cells on cytology 3/anemia -unknown cause not evaluated 4/Hypothyroid  I  spent 40 minutes counseling the patient face to face and 50% or more the  time was spent in counseling and coordination of care. The total time spent in the appointment was 60 minutes.  Grace Isaac MD      [address] ,[place] [address] Office [phone]   Beeper [phone]  06/21/2017 9:33 AM

## 2017-06-21 NOTE — Telephone Encounter (Signed)
Patient called office at 1115am and said he was not coming the appointment that the Cardiothoracic office made for him. He stated he had not picked up the BP medication that was sent in for him on 11/5. That is why his BP is still high. He is going to start the medication and called the office back to set up a appointment. He refused to reschedule with me while on the phone. I have spoke with Baldo Ash and made her aware of what was going on. He has been informed of the risk.

## 2017-06-23 ENCOUNTER — Ambulatory Visit
Admission: RE | Admit: 2017-06-23 | Discharge: 2017-06-23 | Disposition: A | Discharge status: Home / Self Care | Payer: Medicare Other | Source: Ambulatory Visit | Attending: Cardiothoracic Surgery | Admitting: Cardiothoracic Surgery

## 2017-06-23 DIAGNOSIS — C7931 Secondary malignant neoplasm of brain: Secondary | ICD-10-CM

## 2017-06-23 DIAGNOSIS — C3490 Malignant neoplasm of unspecified part of unspecified bronchus or lung: Secondary | ICD-10-CM

## 2017-06-23 DIAGNOSIS — R519 Headache, unspecified: Secondary | ICD-10-CM

## 2017-06-23 DIAGNOSIS — R51 Headache: Principal | ICD-10-CM

## 2017-06-23 MED ORDER — GADOBENATE DIMEGLUMINE 529 MG/ML IV SOLN
20.0000 mL | Freq: Once | INTRAVENOUS | Status: AC | PRN
  Administered 2017-06-23: 20 mL via INTRAVENOUS

## 2017-06-25 ENCOUNTER — Telehealth: Payer: Self-pay | Admitting: *Deleted

## 2017-06-25 NOTE — Telephone Encounter (Signed)
Oncology Nurse Navigator Documentation  Oncology Nurse Navigator Flowsheets 06/25/2017  Navigator Location [facility]  Navigator Encounter Type Telephone/I called Brad [name] but was unable to reach him. I called his emergency contact and left vm message to call me with my name and phone number.  Telephone Outgoing Call  Treatment Phase Abnormal Scans  Barriers/Navigation Needs Coordination of Care  Interventions Coordination of Care  Coordination of Care Other  Acuity Level 1  Time Spent with Patient 15

## 2017-06-26 ENCOUNTER — Ambulatory Visit (HOSPITAL_COMMUNITY): Payer: Medicare Other | Admitting: Anesthesiology

## 2017-06-26 ENCOUNTER — Encounter (HOSPITAL_COMMUNITY)
Admission: RE | Disposition: A | Discharge status: Home / Self Care | Payer: Self-pay | Source: Ambulatory Visit | Attending: Cardiothoracic Surgery

## 2017-06-26 ENCOUNTER — Ambulatory Visit (HOSPITAL_COMMUNITY)
Admission: RE | Admit: 2017-06-26 | Discharge: 2017-06-26 | Disposition: A | Discharge status: Home / Self Care | Payer: Medicare Other | Source: Ambulatory Visit | Attending: Cardiothoracic Surgery | Admitting: Cardiothoracic Surgery

## 2017-06-26 ENCOUNTER — Encounter (HOSPITAL_COMMUNITY): Payer: Self-pay | Admitting: *Deleted

## 2017-06-26 ENCOUNTER — Ambulatory Visit (HOSPITAL_COMMUNITY): Payer: Medicare Other

## 2017-06-26 ENCOUNTER — Other Ambulatory Visit: Payer: Self-pay

## 2017-06-26 DIAGNOSIS — M62838 Other muscle spasm: Secondary | ICD-10-CM | POA: Insufficient documentation

## 2017-06-26 DIAGNOSIS — K219 Gastro-esophageal reflux disease without esophagitis: Secondary | ICD-10-CM | POA: Diagnosis not present

## 2017-06-26 DIAGNOSIS — Z419 Encounter for procedure for purposes other than remedying health state, unspecified: Secondary | ICD-10-CM

## 2017-06-26 DIAGNOSIS — R600 Localized edema: Secondary | ICD-10-CM | POA: Insufficient documentation

## 2017-06-26 DIAGNOSIS — Z7984 Long term (current) use of oral hypoglycemic drugs: Secondary | ICD-10-CM | POA: Insufficient documentation

## 2017-06-26 DIAGNOSIS — G4700 Insomnia, unspecified: Secondary | ICD-10-CM | POA: Insufficient documentation

## 2017-06-26 DIAGNOSIS — E039 Hypothyroidism, unspecified: Secondary | ICD-10-CM | POA: Diagnosis not present

## 2017-06-26 DIAGNOSIS — I10 Essential (primary) hypertension: Secondary | ICD-10-CM | POA: Insufficient documentation

## 2017-06-26 DIAGNOSIS — F17210 Nicotine dependence, cigarettes, uncomplicated: Secondary | ICD-10-CM | POA: Insufficient documentation

## 2017-06-26 DIAGNOSIS — E785 Hyperlipidemia, unspecified: Secondary | ICD-10-CM | POA: Diagnosis not present

## 2017-06-26 DIAGNOSIS — D649 Anemia, unspecified: Secondary | ICD-10-CM | POA: Diagnosis not present

## 2017-06-26 DIAGNOSIS — R918 Other nonspecific abnormal finding of lung field: Secondary | ICD-10-CM | POA: Diagnosis present

## 2017-06-26 DIAGNOSIS — Z79890 Hormone replacement therapy (postmenopausal): Secondary | ICD-10-CM | POA: Insufficient documentation

## 2017-06-26 DIAGNOSIS — Z8673 Personal history of transient ischemic attack (TIA), and cerebral infarction without residual deficits: Secondary | ICD-10-CM | POA: Diagnosis not present

## 2017-06-26 DIAGNOSIS — Z7982 Long term (current) use of aspirin: Secondary | ICD-10-CM | POA: Diagnosis not present

## 2017-06-26 DIAGNOSIS — E1142 Type 2 diabetes mellitus with diabetic polyneuropathy: Secondary | ICD-10-CM | POA: Diagnosis not present

## 2017-06-26 DIAGNOSIS — Z79899 Other long term (current) drug therapy: Secondary | ICD-10-CM | POA: Diagnosis not present

## 2017-06-26 DIAGNOSIS — J449 Chronic obstructive pulmonary disease, unspecified: Secondary | ICD-10-CM | POA: Diagnosis not present

## 2017-06-26 DIAGNOSIS — C3492 Malignant neoplasm of unspecified part of left bronchus or lung: Secondary | ICD-10-CM | POA: Diagnosis not present

## 2017-06-26 DIAGNOSIS — C3412 Malignant neoplasm of upper lobe, left bronchus or lung: Secondary | ICD-10-CM | POA: Diagnosis not present

## 2017-06-26 DIAGNOSIS — J90 Pleural effusion, not elsewhere classified: Secondary | ICD-10-CM | POA: Diagnosis not present

## 2017-06-26 HISTORY — PX: FUDUCIAL PLACEMENT: SHX5083

## 2017-06-26 HISTORY — PX: VIDEO BRONCHOSCOPY WITH ENDOBRONCHIAL NAVIGATION: SHX6175

## 2017-06-26 LAB — COMPREHENSIVE METABOLIC PANEL
ALT: 8 U/L — ABNORMAL LOW (ref 17–63)
AST: 14 U/L — ABNORMAL LOW (ref 15–41)
Albumin: 2.2 g/dL — ABNORMAL LOW (ref 3.5–5.0)
Alkaline Phosphatase: 131 U/L — ABNORMAL HIGH (ref 38–126)
Anion gap: 9 (ref 5–15)
BUN: 14 mg/dL (ref 6–20)
CO2: 23 mmol/L (ref 22–32)
Calcium: 8.3 mg/dL — ABNORMAL LOW (ref 8.9–10.3)
Chloride: 104 mmol/L (ref 101–111)
Creatinine, Ser: 1.04 mg/dL (ref 0.61–1.24)
GFR calc Af Amer: >60 mL/min (ref >60)
GFR calc non Af Amer: >60 mL/min (ref >60)
Glucose, Bld: 101 mg/dL — ABNORMAL HIGH (ref 65–99)
Potassium: 3.5 mmol/L (ref 3.5–5.1)
Sodium: 136 mmol/L (ref 135–145)
Total Bilirubin: 0.3 mg/dL (ref 0.3–1.2)
Total Protein: 6.5 g/dL (ref 6.5–8.1)

## 2017-06-26 LAB — CBC
HCT: 30.6 % — ABNORMAL LOW (ref 39.0–52.0)
Hemoglobin: 9.5 g/dL — ABNORMAL LOW (ref 13.0–17.0)
MCH: 23.6 pg — ABNORMAL LOW (ref 26.0–34.0)
MCHC: 31 g/dL (ref 30.0–36.0)
MCV: 76.1 fL — ABNORMAL LOW (ref 78.0–100.0)
Platelets: 243 10*3/uL (ref 150–400)
RBC: 4.02 MIL/uL — ABNORMAL LOW (ref 4.22–5.81)
RDW: 16.8 % — ABNORMAL HIGH (ref 11.5–15.5)
WBC: 6.2 10*3/uL (ref 4.0–10.5)

## 2017-06-26 LAB — APTT: aPTT: 33 seconds (ref 24–36)

## 2017-06-26 LAB — PROTIME-INR
INR: 1.22
Prothrombin Time: 15.3 seconds — ABNORMAL HIGH (ref 11.4–15.2)

## 2017-06-26 LAB — GLUCOSE, CAPILLARY: Glucose-Capillary: 101 mg/dL — ABNORMAL HIGH (ref 65–99)

## 2017-06-26 SURGERY — VIDEO BRONCHOSCOPY WITH ENDOBRONCHIAL NAVIGATION
Anesthesia: General | Site: Chest

## 2017-06-26 MED ORDER — ONDANSETRON HCL 4 MG/2ML IJ SOLN: 4.0000 mg | Freq: Once | INTRAMUSCULAR | Status: DC | PRN

## 2017-06-26 MED ORDER — MIDAZOLAM HCL 2 MG/2ML IJ SOLN
INTRAMUSCULAR | Status: AC
  Filled 2017-06-26: qty 2

## 2017-06-26 MED ORDER — LACTATED RINGERS IV SOLN
INTRAVENOUS | Status: DC | PRN
  Administered 2017-06-26: 07:00:00 via INTRAVENOUS

## 2017-06-26 MED ORDER — ONDANSETRON HCL 4 MG/2ML IJ SOLN
INTRAMUSCULAR | Status: AC
  Filled 2017-06-26: qty 2

## 2017-06-26 MED ORDER — SUGAMMADEX SODIUM 200 MG/2ML IV SOLN
INTRAVENOUS | Status: DC | PRN
  Administered 2017-06-26: 200 mg via INTRAVENOUS

## 2017-06-26 MED ORDER — GLYCOPYRROLATE 0.2 MG/ML IJ SOLN
INTRAMUSCULAR | Status: DC | PRN
  Administered 2017-06-26: 0.2 mg via INTRAVENOUS

## 2017-06-26 MED ORDER — ROCURONIUM BROMIDE 100 MG/10ML IV SOLN
INTRAVENOUS | Status: DC | PRN
  Administered 2017-06-26: 10 mg via INTRAVENOUS
  Administered 2017-06-26: 50 mg via INTRAVENOUS

## 2017-06-26 MED ORDER — PROPOFOL 10 MG/ML IV BOLUS
INTRAVENOUS | Status: DC | PRN
  Administered 2017-06-26: 170 mg via INTRAVENOUS

## 2017-06-26 MED ORDER — FENTANYL CITRATE (PF) 250 MCG/5ML IJ SOLN
INTRAMUSCULAR | Status: AC
  Filled 2017-06-26: qty 5

## 2017-06-26 MED ORDER — MIDAZOLAM HCL 5 MG/5ML IJ SOLN
INTRAMUSCULAR | Status: DC | PRN
  Administered 2017-06-26: 2 mg via INTRAVENOUS

## 2017-06-26 MED ORDER — FENTANYL CITRATE (PF) 100 MCG/2ML IJ SOLN
INTRAMUSCULAR | Status: DC | PRN
  Administered 2017-06-26: 100 ug via INTRAVENOUS
  Administered 2017-06-26: 50 ug via INTRAVENOUS

## 2017-06-26 MED ORDER — SUGAMMADEX SODIUM 200 MG/2ML IV SOLN
INTRAVENOUS | Status: AC
  Filled 2017-06-26: qty 2

## 2017-06-26 MED ORDER — LIDOCAINE 2% (20 MG/ML) 5 ML SYRINGE
INTRAMUSCULAR | Status: AC
  Filled 2017-06-26: qty 5

## 2017-06-26 MED ORDER — ROCURONIUM BROMIDE 10 MG/ML (PF) SYRINGE
PREFILLED_SYRINGE | INTRAVENOUS | Status: AC
  Filled 2017-06-26: qty 5

## 2017-06-26 MED ORDER — DEXAMETHASONE SODIUM PHOSPHATE 10 MG/ML IJ SOLN
INTRAMUSCULAR | Status: DC | PRN
  Administered 2017-06-26: 10 mg via INTRAVENOUS

## 2017-06-26 MED ORDER — ONDANSETRON HCL 4 MG/2ML IJ SOLN
INTRAMUSCULAR | Status: DC | PRN
  Administered 2017-06-26: 4 mg via INTRAVENOUS

## 2017-06-26 MED ORDER — FENTANYL CITRATE (PF) 100 MCG/2ML IJ SOLN: 25.0000 ug | INTRAMUSCULAR | Status: DC | PRN

## 2017-06-26 MED ORDER — LIDOCAINE HCL (CARDIAC) 20 MG/ML IV SOLN
INTRAVENOUS | Status: DC | PRN
  Administered 2017-06-26: 40 mg via INTRAVENOUS
  Administered 2017-06-26: 80 mg via INTRAVENOUS

## 2017-06-26 MED ORDER — PHENYLEPHRINE HCL 10 MG/ML IJ SOLN
INTRAVENOUS | Status: DC | PRN
  Administered 2017-06-26: 25 ug/min via INTRAVENOUS

## 2017-06-26 MED ORDER — EPINEPHRINE PF 1 MG/ML IJ SOLN
INTRAMUSCULAR | Status: AC
  Filled 2017-06-26: qty 1

## 2017-06-26 MED ORDER — PROPOFOL 10 MG/ML IV BOLUS
INTRAVENOUS | Status: AC
  Filled 2017-06-26: qty 20

## 2017-06-26 MED ORDER — 0.9 % SODIUM CHLORIDE (POUR BTL) OPTIME
TOPICAL | Status: DC | PRN
  Administered 2017-06-26: 1000 mL

## 2017-06-26 SURGICAL SUPPLY — 36 items
ADAPTER BRONCH F/PENTAX (ADAPTER) ×2 IMPLANT
ADPR BSCP EDG PNTX (ADAPTER) ×1
BRUSH BIOPSY BRONCH 10 SDTNB (MISCELLANEOUS) IMPLANT
BRUSH SUPERTRAX BIOPSY (INSTRUMENTS) IMPLANT
BRUSH SUPERTRAX NDL-TIP CYTO (INSTRUMENTS) ×1 IMPLANT
CANISTER SUCT 3000ML PPV (MISCELLANEOUS) ×2 IMPLANT
CHANNEL WORK EXTEND EDGE 180 (KITS) IMPLANT
CHANNEL WORK EXTEND EDGE 45 (KITS) IMPLANT
CHANNEL WORK EXTEND EDGE 90 (KITS) IMPLANT
CONT SPEC 4OZ CLIKSEAL STRL BL (MISCELLANEOUS) ×4 IMPLANT
COVER BACK TABLE 60X90IN (DRAPES) ×2 IMPLANT
FILTER STRAW FLUID ASPIR (MISCELLANEOUS) IMPLANT
FORCEPS BIOP SUPERTRX PREMAR (INSTRUMENTS) IMPLANT
GAUZE SPONGE 4X4 12PLY STRL (GAUZE/BANDAGES/DRESSINGS) ×2 IMPLANT
GLOVE BIO SURGEON STRL SZ 6.5 (GLOVE) ×2 IMPLANT
KIT CLEAN ENDO COMPLIANCE (KITS) ×2 IMPLANT
KIT MARKER FIDUCIAL DELIVERY (KITS) ×1 IMPLANT
KIT PROCEDURE EDGE 180 (KITS) ×1 IMPLANT
KIT PROCEDURE EDGE 45 (KITS) IMPLANT
KIT PROCEDURE EDGE 90 (KITS) IMPLANT
KIT ROOM TURNOVER OR (KITS) ×2 IMPLANT
MARKER FIDUCIAL SL NIT COIL (Implant Marker) ×3 IMPLANT
MARKER SKIN DUAL TIP RULER LAB (MISCELLANEOUS) ×2 IMPLANT
NDL SUPERTRX PREMARK BIOPSY (NEEDLE) IMPLANT
NEEDLE SUPERTRX PREMARK BIOPSY (NEEDLE) IMPLANT
NS IRRIG 1000ML POUR BTL (IV SOLUTION) ×2 IMPLANT
OIL SILICONE PENTAX (PARTS (SERVICE/REPAIRS)) ×2 IMPLANT
PAD ARMBOARD 7.5X6 YLW CONV (MISCELLANEOUS) ×4 IMPLANT
PATCHES PATIENT (LABEL) ×6 IMPLANT
SYR 20CC LL (SYRINGE) IMPLANT
SYR 20ML ECCENTRIC (SYRINGE) ×3 IMPLANT
SYR 3ML LL SCALE MARK (SYRINGE) ×2 IMPLANT
TRAP SPECIMEN MUCOUS 40CC (MISCELLANEOUS) ×2 IMPLANT
TUBE CONNECTING 20X1/4 (TUBING) ×2 IMPLANT
UNDERPAD 30X30 (UNDERPADS AND DIAPERS) ×1 IMPLANT
WATER STERILE IRR 1000ML POUR (IV SOLUTION) ×2 IMPLANT

## 2017-06-26 NOTE — Progress Notes (Signed)
Brad [name] called and verified she will be taking Mr. Larch home and not allow him to drive until tomorrow. She will also be staying with him.

## 2017-06-26 NOTE — Discharge Instructions (Signed)
Flexible Bronchoscopy, Care After °This sheet gives you information about how to care for yourself after your procedure. Your health care provider may also give you more specific instructions. If you have problems or questions, contact your health care provider. °What can I expect after the procedure? °After the procedure, it is common to have the following symptoms for 24-48 hours: °· A cough that is worse than it was before the procedure. °· A low-grade fever. °· A sore throat or hoarse voice. °· Small streaks of blood in the mucus from your lungs (sputum), if tissue samples were removed (biopsy). ° °Follow these instructions at home: °Eating and drinking °· Do not eat or drink anything (including water) for 2 hours after your procedure, or until your numbing medicine (local anesthetic) has worn off. Having a numb throat increases your risk of burning yourself or choking. °· After your numbness is gone and your cough and gag reflexes have returned, you may start eating only soft foods and slowly drinking liquids. °· The day after the procedure, return to your normal diet. °Driving °· Do not drive for 24 hours if you were given a medicine to help you relax (sedative). °· Do not drive or use heavy machinery while taking prescription pain medicine. °General instructions °· Take over-the-counter and prescription medicines only as told by your health care provider. °· Return to your normal activities as told by your health care provider. Ask your health care provider what activities are safe for you. °· Do not use any products that contain nicotine or tobacco, such as cigarettes and e-cigarettes. If you need help quitting, ask your health care provider. °· Keep all follow-up visits as told by your health care provider. This is important, especially if you had a biopsy taken. °Get help right away if: °· You have shortness of breath that gets worse. °· You become light-headed or feel like you might faint. °· You have  chest pain. °· You cough up more than a small amount of blood. °· The amount of blood you cough up increases. °Summary °· Common symptoms in the 24-48 hours following a flexible bronchoscopy include cough, low-grade fever, sore throat or hoarse voice, and blood-streaked mucus from the lungs (if you had a biopsy). °· Do not eat or drink anything (including water) for 2 hours after your procedure, or until your local anesthetic has worn off. You can return to your normal diet the day after the procedure. °· Get help right away if you develop worsening shortness of breath, have chest pain, become light-headed, or cough up more than a small amount of blood. °This information is not intended to replace advice given to you by your health care provider. Make sure you discuss any questions you have with your health care provider. °Document Released: 02/10/2005 Document Revised: 08/11/2016 Document Reviewed: 08/11/2016 °Elsevier Interactive Patient Education © 2017 [employer] ° °

## 2017-06-26 NOTE — Anesthesia Procedure Notes (Addendum)
Procedure Name: Intubation Date/Time: 06/26/2017 7:42 AM Performed by: Kyung Rudd, CRNA Pre-anesthesia Checklist: Patient identified, Emergency Drugs available, Suction available and Patient being monitored Patient Re-evaluated:Patient Re-evaluated prior to induction Oxygen Delivery Method: Circle system utilized Preoxygenation: Pre-oxygenation with 100% oxygen Induction Type: IV induction Ventilation: Mask ventilation without difficulty and Oral airway inserted - appropriate to patient size Laryngoscope Size: Mac and 4 Grade View: Grade I Tube type: Oral Tube size: 8.5 mm Number of attempts: 1 Airway Equipment and Method: Stylet and LTA kit utilized Placement Confirmation: ETT inserted through vocal cords under direct vision,  positive ETCO2 and breath sounds checked- equal and bilateral Secured at: 22 cm Tube secured with: Tape Dental Injury: Teeth and Oropharynx as per pre-operative assessment

## 2017-06-26 NOTE — Transfer of Care (Signed)
Immediate Anesthesia Transfer of Care Note  Patient: HEKTOR HUSTON  Procedure(s) Performed: VIDEO BRONCHOSCOPY WITH ENDOBRONCHIAL NAVIGATION WITH PLACEMENT OF FIDUCIALS (N/A Chest) PLACEMENT OF FUDUCIAL (N/A Chest)  Patient Location: PACU  Anesthesia Type:General  Level of Consciousness: awake, alert  and oriented  Airway & Oxygen Therapy: Patient Spontanous Breathing and Patient connected to nasal cannula oxygen  Post-op Assessment: Report given to RN, Post -op Vital signs reviewed and stable and Patient moving all extremities X 4  Post vital signs: Reviewed and stable  Last Vitals:  Vitals:   06/26/17 0656 06/26/17 0919  BP: (!) 193/59 (!) (P) 121/50  Pulse: 65 62  Resp: 18 (!) 23  Temp: 36.5 C 36.7 C  SpO2: 99% 99%    Last Pain:  Vitals:   06/26/17 0656  TempSrc: Oral      Patients Stated Pain Goal: 2 (35/68/61 6837)  Complications: No apparent anesthesia complications

## 2017-06-26 NOTE — Progress Notes (Addendum)
Dr Linna Caprice informed of pt with no one at home and no ride home. Informed Dr Servando Snare about no ride home and no one to be with him 24 hours after.

## 2017-06-26 NOTE — Progress Notes (Signed)
Informed him that he needs someone with him 24 hours after procedure. States he was planning on driving himself home. Informed him that he can not drive after anesthesia. He states he might be able to call someone depending on the time he will be out.  Asked him who he will call States "I can't think of anyone right now"

## 2017-06-26 NOTE — Brief Op Note (Signed)
      [address]       [place] [address]             [phone]     06/26/2017  9:22 AM  PATIENT:  Brad [name]  61 y.o. male  PRE-OPERATIVE DIAGNOSIS:  left lung mass  POST-OPERATIVE DIAGNOSIS: Non-small cell lung cancer by quick stain  PROCEDURE:  Procedure(s): VIDEO BRONCHOSCOPY WITH ENDOBRONCHIAL NAVIGATION WITH PLACEMENT OF FIDUCIALS (N/A) PLACEMENT OF FUDUCIAL (N/A)  SURGEON:  Surgeon(s) and Role:    * Grace Isaac, MD - Primary  ANESTHESIA:   general  EBL:  5 mL   BLOOD ADMINISTERED:none  DRAINS: none   LOCAL MEDICATIONS USED:  NONE  SPECIMEN:  Source of Specimen:  Left upper lobe lung mass  DISPOSITION OF SPECIMEN:  PATHOLOGY  COUNTS:  YES   DICTATION: .Dragon Dictation  PLAN OF CARE: Discharge to home after PACU  PATIENT DISPOSITION:  PACU - hemodynamically stable.   Delay start of Pharmacological VTE agent (>24hrs) due to surgical blood loss or risk of bleeding: yes

## 2017-06-26 NOTE — Progress Notes (Addendum)
Spoke with Brad [name] [phone] states she will be able to take him home also informed her that someone will need to be with him for 24 hours. Spoke with the OR nurse and informed of above.

## 2017-06-26 NOTE — Anesthesia Preprocedure Evaluation (Addendum)
Anesthesia Evaluation  Patient identified by MRN, date of birth, ID band Patient awake    Reviewed: Allergy & Precautions, NPO status , Patient's Chart, lab work & pertinent test results  Airway Mallampati: II  TM Distance: >3 FB Neck ROM: Full    Dental  (+) Edentulous Upper, Partial Lower   Pulmonary Current Smoker,    breath sounds clear to auscultation       Cardiovascular hypertension,  Rhythm:Regular Rate:Normal     Neuro/Psych    GI/Hepatic   Endo/Other  diabetes  Renal/GU      Musculoskeletal   Abdominal   Peds  Hematology   Anesthesia Other Findings   Reproductive/Obstetrics                            Anesthesia Physical Anesthesia Plan  ASA: III  Anesthesia Plan:    Post-op Pain Management:    Induction: Intravenous  PONV Risk Score and Plan: Ondansetron and Dexamethasone  Airway Management Planned: Oral ETT  Additional Equipment:   Intra-op Plan:   Post-operative Plan: Extubation in OR  Informed Consent: I have reviewed the patients History and Physical, chart, labs and discussed the procedure including the risks, benefits and alternatives for the proposed anesthesia with the patient or authorized representative who has indicated his/her understanding and acceptance.     Plan Discussed with: CRNA and Anesthesiologist  Anesthesia Plan Comments:         Anesthesia Quick Evaluation

## 2017-06-26 NOTE — Progress Notes (Signed)
Discharge instructions reviewed with patient and Jackelyn Poling (friend), no questions at this time, Jackelyn Poling understood that she needs to supervise patient for at least 24 hour after procedure. AVS signed, iv taken out and patient accompanied home by [place] and her husband.    Sharene Skeans, RN

## 2017-06-26 NOTE — H&P (Signed)
[address]       [place] [address]             [phone]                    Tarrance L Mortell Drummond Medical Record #[id number] Date of Birth: 11-06-1955  Referring:Dr Elsworth Soho Primary Care: Biagio Borg, MD  Chief Complaint:    Lung Mass  History of Present Illness:    Brad [name] 61 y.o. male is seen in the office   for for evaluation of left lung mass the patient relates in his history that 2 months ago he was noted increasing shortness of breath and dyspnea on exertion and leg swelling.  He been evaluated by the pulmonary service for a left lung mass and left pleural effusion.  So far PET scan has been done navigation bronchoscopy and thoracentesis all with negative results with exception of atypical cells on the pleural fluid.  The patient has significant underlying pulmonary disease with 55% FEV1, chronic dyspnea, patient notes he is unable to climb stairs without being extremely short of breath. Patient is a long-term smoker smokes at least a pack a day and notes that he will not stop.  We did a 6-minute walk test in the office today he was unable to complete it-stopped after 4 minutes  Adequacy Reason Satisfactory For Evaluation. Diagnosis PLEURAL FLUID, LEFT (SPECIMEN 1 OF 1, COLLECTED ON 05/02/17): ATYPICAL CELLS PRESENT. SEE COMMENT. COMMENT: TISSUE STUDIES SUCH AS A BIOPSY MAY HELP BETTER EVALUATE THE EXTENT AND SEVERITY OF THESE ATYPICAL CELLS. Enid Cutter MD  Current Activity/ Functional Status:  Patient is independent with mobility/ambulation, transfers, ADL's, IADL's.   Zubrod Score: At the time of surgery this patient's most appropriate activity status/level should be described as: []     0    Normal activity, no symptoms []     1    Restricted in physical strenuous activity but ambulatory, able to do out light work [x]     2    Ambulatory and capable of self care, unable to do work activities, up and about               >50 %  of waking hours                              []     3    Only limited self care, in bed greater than 50% of waking hours []     4    Completely disabled, no self care, confined to bed or chair []     5    Moribund   Past Medical History:  Diagnosis Date  . Anemia    takes Ferrous Sulfated daily  . Arthritis   . Carotid stenosis 08/27/2014   takes Aspirin daily  . Chronic hepatitis C ([facility]) 09/17/2015  . COPD (chronic obstructive pulmonary disease) ([facility])    Albuterol daily as needed and Spiriva daily  . Depression   . Diabetes mellitus type II    takes Metformin and Glipizide daily  . Dizziness   . GERD (gastroesophageal reflux disease)   . GI bleed 12/2015  . Headache    occasionally  . History of blood clots    in right leg;being followed by Dr.Brabham for this  . History of blood transfusion 09/2014   2 units-no abnormal reaction noted  . History of gastric  ulcer   . History of hiatal hernia   . HTN (hypertension) 09/14/2015  . Hyperlipidemia 08/27/2014   takes Simvastatin daily  . Hypertension    takes Amlodipine and Lisinopril daily  . Hypothyroidism 08/27/2014   takes Synthroid daily  . Insomnia    takes Pamelor nightly  . Joint pain    hips  . Muscle spasm    takes Zanaflex daily as needed  . MVP (mitral valve prolapse)   . Peripheral edema    takes Furosemide daily as needed  . Peripheral neuropathy   . Prurigo nodularis   . Shortness of breath dyspnea   . Stroke ([facility])   . Urinary frequency   . Urinary urgency     Past Surgical History:  Procedure Laterality Date  . Carotid PTA/Stent Intervention N/A 11/30/2015   Performed by Serafina Mitchell, MD at [facility]  . ENDARTERECTOMY CAROTID WITH PATCH ANGIOPLASTY Left 11/13/2014   Performed by Serafina Mitchell, MD at [facility] OR  . ESOPHAGOGASTRODUODENOSCOPY (EGD) N/A 09/24/2014   Performed by Winfield Cunas., MD at [facility] ENDOSCOPY  . I&D of right arm    . INCISION AND DRAINAGE PERIRECTAL ABSCESS  03/2009  . Neg  Stress Test  2000  . ROTATOR CUFF REPAIR Right 2009   multiple f/u Sxs/infection Tx  . VASCULAR SURGERY    . VIDEO BRONCHOSCOPY WITH ENDOBRONCHIAL NAVIGATION N/A 05/25/2017   Performed by Rigoberto Noel, MD at [facility]  . VIDEO BRONCHOSCOPY WITH ENDOBRONCHIAL ULTRASOUND N/A 05/25/2017   Performed by Rigoberto Noel, MD at [facility] OR    Family History  Problem Relation Age of Onset  . Heart disease Mother        automated implantable cardioverter-defibrillator   . Diabetes type II Mother   . Alcohol abuse Father   . Lung cancer Father   . Lung cancer Maternal Grandmother   . Lung cancer Sister     Social History   Socioeconomic History  . Marital status: Single    Spouse name: Not on file  . Number of children: 0  . Years of education: Not on file  . Highest education level: Not on file  Social Needs  . Financial resource strain: Not on file  . Food insecurity - worry: Not on file  . Food insecurity - inability: Not on file  . Transportation needs - medical: Not on file  . Transportation needs - non-medical: Not on file  Occupational History  . Occupation: Disabled  Tobacco Use  . Smoking status: Current Every Day Smoker    Packs/day: 1.00    Years: 48.00    Pack years: 48.00    Types: Cigarettes  . Smokeless tobacco: Never Used  . Tobacco comment: 1-2 pks per day  Substance and Sexual Activity  . Alcohol use: Yes    Alcohol/week: 0.6 oz    Types: 1 Cans of beer per week    Comment: rarely  . Drug use: No  . Sexual activity: Yes  Other Topics Concern  . Not on file  Social History Narrative   Divorced with fiance.   Disabled since June 2010 - right shoulder   No children.          Social History   Tobacco Use  Smoking Status Current Every Day Smoker  . Packs/day: 1.00  . Years: 48.00  . Pack years: 48.00  . Types: Cigarettes  Smokeless Tobacco Never Used  Tobacco Comment   1-2  pks per day    Social History   Substance and Sexual Activity  Alcohol Use  Yes  . Alcohol/week: 0.6 oz  . Types: 1 Cans of beer per week   Comment: rarely     Allergies  Allergen Reactions  . Doxycycline Hives and Itching  . Lipitor [Atorvastatin] Other (See Comments)    Myalgia   . Oxycodone Itching    No current facility-administered medications for this encounter.     Pertinent items are noted in HPI.   Review of Systems:     Cardiac Review of Systems: Y or N  Chest Pain [  y  ]  Resting SOB [ y  ] Exertional SOB  Blue.Reese  ]  Orthopnea [ y ]   Pedal Edema Blue.Reese   ]    Palpitations [ n ] Syncope  [ n ]   Presyncope [n   ]  General Review of Systems: [Y] = yes [  ]=no Constitional: recent weight change [  ];  Wt loss over the last 3 months [   ] anorexia [  ]; fatigue [ y ]; nausea [  ]; night sweats [  ]; fever [  ]; or chills [  ];          Dental: poor dentition[  ]; Last Dentist visit:   Eye : blurred vision [  ]; diplopia [   ]; vision changes [  ];  Amaurosis fugax[  ]; Resp: cough [  ];  wheezing[y  ];  hemoptysis[ n ]; shortness of breath[y  ]; paroxysmal nocturnal dyspnea[y  ]; dyspnea on exertion[ y ]; or orthopnea[ y ];  GI:  gallstones[  ], vomiting[  ];  dysphagia[  ]; melena[  ];  hematochezia [  ]; heartburn[  ];   Hx of  Colonoscopy[  ]; GU: kidney stones [  ]; hematuria[  ];   dysuria [  ];  nocturia[  ];  history of     obstruction [  ]; urinary frequency [  ]             Skin: rash, swelling[  ];, hair loss[  ];  peripheral edema[  ];  or itching[  ]; Musculosketetal: myalgias[  ];  joint swelling[  ];  joint erythema[  ];  joint pain[  ];  back pain[  ];  Heme/Lymph: bruising[  ];  bleeding[  ];  anemia[  ];  Neuro: TIA[ n ];  headaches[  ];  stroke[  ];  vertigo[  ];  seizures[  ];   paresthesias[  ];  difficulty walking[  ];  Psych:depression[  ]; anxiety[  ];  Endocrine: diabetes[y  ];  thyroid dysfunction[ y ];  Immunizations: Flu up to date Blue.Reese ]; Pneumococcal up to date [  n];  Other:  Physical Exam: BP (!) 193/59   Pulse 65    Temp 97.7 F (36.5 C) (Oral)   Resp 18   Ht 5\' 11"  (1.803 m)   Wt 242 lb (109.8 kg)   SpO2 99%   BMI 33.75 kg/m   PHYSICAL EXAMINATION: General appearance: alert, cooperative, appears older than stated age and no distress Head: Normocephalic, without obvious abnormality, atraumatic Neck: no adenopathy, no carotid bruit, no JVD, supple, symmetrical, trachea midline and thyroid not enlarged, symmetric, no tenderness/mass/nodules Lymph nodes: Cervical, supraclavicular, and axillary nodes normal. Resp: clear to auscultation bilaterally Back: symmetric, no curvature. ROM normal. No CVA tenderness. Cardio: regular rate and rhythm, S1,  S2 normal, no murmur, click, rub or gallop GI: soft, non-tender; bowel sounds normal; no masses,  no organomegaly Extremities: extremities normal, atraumatic, no cyanosis or edema and Homans sign is negative, no sign of DVT Neurologic: Grossly normal   6 Minute Walk Test Results  Patient: LAMARKUS NEBEL Date:  06/26/2017   Supplemental O2 during test? No      Baseline   End  Time   1006am   1012am Heartrate  66    81 Dyspnea  0    3 Fatigue  0    4 O2 sat   98%    98%        Patient ambulated at a slow pace for a total distance of 490 feet with 1 stops.  Ambulation was limited primarily due to calf pain      Diagnostic Studies & Laboratory data:     Recent Radiology Findings:  Dg Chest 2 View  Result Date: 06/26/2017 CLINICAL DATA:  Preoperative exam for left lung biopsy. EXAM: CHEST  2 VIEW COMPARISON:  05/25/2017. FINDINGS: Mediastinum hilar structures are stable. Stable mild cardiomegaly. No pulmonary venous congestion. Assistant density/infiltrate left upper lung. Tiny left pleural effusion cannot be excluded. No pneumothorax. Surgical clips left chest. IMPRESSION: Persistent density/infiltrate left upper lung. Tiny left pleural effusion cannot be excluded. Electronically Signed   By: Marcello Moores  Register   On: 06/26/2017 07:01   Mr  Brain W Wo Contrast  Result Date: 06/23/2017 CLINICAL DATA:  Non intractable headache. Malignant neoplasm of the lung. Brain metastases. EXAM: MRI HEAD WITHOUT AND WITH CONTRAST TECHNIQUE: Multiplanar, multiecho pulse sequences of the brain and surrounding structures were obtained without and with intravenous contrast. CONTRAST:  75mL MULTIHANCE GADOBENATE DIMEGLUMINE 529 MG/ML IV SOLN COMPARISON:  MRI brain 09/27/2015. FINDINGS: Brain: Remote infarcts of the cerebellum are noted. Remote blood products are present on the right. Remote infarcts are present within the thalamus bilaterally. Mild periventricular and subcortical white matter disease is progressive. No acute hemorrhage is present. White matter changes extend into the brainstem. The cerebellum is unremarkable. The internal auditory canals are normal bilaterally. The postcontrast images demonstrate no pathologic enhancement. Vascular: Normal flow voids. Skull and upper cervical spine: Flow is present in the major intracranial arteries. Sinuses/Orbits: The skullbase is within normal limits. The craniocervical junction is within normal limits. IMPRESSION: 1. No evidence for metastatic disease of the brain or meninges. 2. Remote infarcts of the basal ganglia and thalamus bilaterally. 3. No acute infarct. Electronically Signed   By: San Morelle M.D.   On: 06/23/2017 15:25     Dg Chest Port 1 View  Result Date: 05/25/2017 CLINICAL DATA:  Left lung biopsy. EXAM: PORTABLE CHEST 1 VIEW COMPARISON:  Chest x-ray 05/02/2017.  Chest CT 05/17/2017 . FINDINGS: Mediastinum hilar structures normal. Cardiomegaly with normal pulmonary vascularity. Persistent density left upper lobe with adjacent pleuroparenchymal thickening. No prominent pleural effusion. No pneumothorax IMPRESSION: No evidence of pneumothorax post biopsy. Left upper lobe density again noted. Electronically Signed   By: [place]   On: 05/25/2017 15:44  CLINICAL DATA:  Super D chest  for bronchoscopic biopsy.  EXAM: CT CHEST WITHOUT CONTRAST  TECHNIQUE: Multidetector CT imaging of the chest was performed using thin slice collimation for electromagnetic bronchoscopy planning purposes, without intravenous contrast.  COMPARISON:  Chest CT 04/13/2017 and PET-CT 04/25/2017  FINDINGS: Cardiovascular: The heart is normal in size. No pericardial effusion. Mild pericardial thickening. The aorta is normal in caliber. Age advanced atherosclerotic calcifications. Markedly age  advanced three-vessel coronary artery calcifications. A  Mediastinum/Nodes: Stable 9.5 mm pretracheal lymph node on image number 59. Stable 12 mm prevascular lymph node on image number 51. Small subcarinal nodes are stable.  Lungs/Pleura: 25 x 20 mm spiculated left upper lobe lung lesion, slightly enlarged when compared to prior study where it measured 23 x 17 mm. Findings suspicious for endobronchial and/or interstitial spread of tumor into the left lung apex.  Stable emphysematous changes. No other pulmonary nodules. Bibasilar scarring changes hand subpleural atelectasis.  Upper Abdomen: No significant upper abdominal findings. No obvious hepatic or adrenal gland metastasis.  Musculoskeletal: No chest wall mass, supraclavicular or axillary adenopathy. The thyroid gland is normal except for a few tiny nodules.  No significant bony findings.  IMPRESSION: 1. 25 x 20 mm spiculated left upper lobe lung lesion slightly enlarged when compared to prior study. 2. Findings suspicious for endobronchial and or interstitial spread of tumor into the left lung apex. 3. Small stable mediastinal lymph nodes. 4. Stable emphysematous changes. 5. Very small left pleural effusion.  Aortic Atherosclerosis (ICD10-I70.0) and Emphysema (ICD10-J43.9).   Electronically Signed   By: Marijo Sanes M.D.   On: 05/18/2017 08:38 CLINICAL DATA:  Initial treatment strategy for lung  nodule.  EXAM: NUCLEAR MEDICINE PET SKULL BASE TO THIGH  TECHNIQUE: 12.2 mCi F-18 FDG was injected intravenously. Full-ring PET imaging was performed from the skull base to thigh after the radiotracer. CT data was obtained and used for attenuation correction and anatomic localization.  FASTING BLOOD GLUCOSE:  Value: 132 mg/dl  COMPARISON:  04/13/2017  FINDINGS: NECK  No hypermetabolic lymph nodes in the neck.  CHEST  Within the LEFT upper lobe, 2.5 cm lobular nodule has intense metabolic activity SUV max equal 10.5.  No additional hypermetabolic pulmonary nodules.  No clearly hypermetabolic mediastinal lymph nodes. Prevascular lymph node measuring 10 mm short axis (image 68, series 4) has metabolic activity similar to mediastinal activity with SUV max equal 2.3.  ABDOMEN/PELVIS  No abnormal hypermetabolic activity within the liver, pancreas, adrenal glands, or spleen. No hypermetabolic lymph nodes in the abdomen or pelvis.  Mixed density lesions of the kidney without associated metabolic activity.  SKELETON  No focal hypermetabolic activity to suggest skeletal metastasis.  IMPRESSION: 1. Hypermetabolic nodule in the LEFT upper lobe is most consistent with primary bronchogenic carcinoma. 2. Mildly enlarged prevascular lymph node with minimal metabolic activity is favored benign. If multidisciplinary follow up management is desired, this is available in the [facility] through the Multid[facility]acic [facility] [phone].  These results will be called to the ordering clinician or representative by the Radiologist Assistant, and communication documented in the PACS or zVision Dashboard.   Electronically Signed   By: Suzy Bouchard M.D.   On: 04/25/2017 10:47    I have independently reviewed the above radiologic studies.  Recent Lab Findings: Lab Results  Component Value Date   WBC 8.4 05/23/2017   HGB 10.7 (L)  05/23/2017   HCT 33.1 (L) 05/23/2017   PLT 266 05/23/2017   GLUCOSE 101 (H) 05/23/2017   CHOL 189 05/10/2016   TRIG 316.0 (H) 05/10/2016   HDL 27.50 (L) 05/10/2016   LDLDIRECT 125.0 05/10/2016   LDLCALC 80 12/07/2015   ALT 15 05/10/2016   AST 20 05/10/2016   NA 136 05/23/2017   K 3.3 (L) 05/23/2017   CL 102 05/23/2017   CREATININE 1.26 (H) 05/23/2017   BUN 18 05/23/2017   CO2 26 05/23/2017   TSH 5.09 (H) 05/10/2016  INR 1.19 12/08/2015   HGBA1C 6.2 (H) 05/23/2017    Chronic Kidney Disease   Stage I     GFR >90  Stage II    GFR 60-89  Stage IIIA GFR 45-59  Stage IIIB GFR 30-44  Stage IV   GFR 15-29  Stage V    GFR  <15  Lab Results  Component Value Date   CREATININE 1.26 (H) 05/23/2017   CrCl cannot be calculated (Patient's most recent lab result is older than the maximum 21 days allowed.). PFT" FEV! 2.22 55% DLCO19.46 60% Conclusions: Moderately severe airway obstruction is present suggesting small airway disease. A clinical trial of bronchodilators may be beneficial in view of the airway obstruction. Pulmonary Function Diagnosis: Moderately severe Obstructive Airways Disease Restriction -Possible  Assessment / Plan:   1/Primary carcinoma of the lung radiographically clinical T3 (?  Satellite nodules left upper lobe) primary 2.5 cm, negative nodes by PET scan.  Clinical stage IIb in a patient with marginal functional status continues to smoke and unable to complete a 6-minute walk test.  I discussed with the patient continuing his workup, repeat navigation bronchoscopy with placement of fiducials, consultation in the multidisciplinary thoracic oncology clinic before making a definitive treatment recommendation.  2/left effusion-atypical cells on cytology 3/anemia -unknown cause not evaluated 4/Hypothyroid 5/MRI of brain- no METS   The goals risks and alternatives of the planned surgical procedure Procedure(s): VIDEO BRONCHOSCOPY WITH ENDOBRONCHIAL NAVIGATION WITH  PLACEMENT OF FIDUCIALS (N/A) PLACEMENT OF FUDUCIAL (N/A)  have been discussed with the patient in detail. The risks of the procedure including death, infection, stroke, myocardial infarction, bleeding, blood transfusion have all been discussed specifically.  I have quoted Verlin Fester a 1 % of perioperative mortality and a complication rate as high as 10 %. The patient's questions have been answered.WILMAR PRABHAKAR is willing  to proceed with the planned procedure.   Grace Isaac MD      [address] [place] [address] Office [phone]   Beeper [phone]  06/26/2017 7:15 AM

## 2017-06-26 NOTE — Anesthesia Postprocedure Evaluation (Signed)
Anesthesia Post Note  Patient: Brad [name]  Procedure(s) Performed: VIDEO BRONCHOSCOPY WITH ENDOBRONCHIAL NAVIGATION WITH PLACEMENT OF FIDUCIALS (N/A Chest) PLACEMENT OF FUDUCIAL (N/A Chest)     Patient location during evaluation: PACU Anesthesia Type: General Level of consciousness: awake, awake and alert and oriented Pain management: pain level controlled Vital Signs Assessment: post-procedure vital signs reviewed and stable Respiratory status: spontaneous breathing, nonlabored ventilation and respiratory function stable Cardiovascular status: blood pressure returned to baseline Anesthetic complications: no    Last Vitals:  Vitals:   06/26/17 1000 06/26/17 1015  BP: 122/61 (!) 145/62  Pulse: (!) 56 (!) 55  Resp: (!) 23 18  Temp: (!) 36.3 C   SpO2: 94% 96%    Last Pain:  Vitals:   06/26/17 0656  TempSrc: Oral                 [name]

## 2017-06-27 ENCOUNTER — Encounter (HOSPITAL_COMMUNITY): Payer: Self-pay | Admitting: Cardiothoracic Surgery

## 2017-06-27 ENCOUNTER — Telehealth: Payer: Self-pay | Admitting: *Deleted

## 2017-06-27 DIAGNOSIS — R918 Other nonspecific abnormal finding of lung field: Secondary | ICD-10-CM

## 2017-06-27 NOTE — Telephone Encounter (Signed)
Oncology Nurse Navigator Documentation  Oncology Nurse Navigator Flowsheets 06/27/2017  Navigator Location [facility]  Navigator Encounter Type Telephone/I received referral on Brad [name]. I called and scheduled him to be seen on 06/29/17 per Dr. Julien Nordmann. Patient verbalized understanding of appt time and place.   Telephone Outgoing Call  Treatment Phase Pre-Tx/Tx Discussion  Barriers/Navigation Needs Coordination of Care  Interventions Coordination of Care  Coordination of Care Appts  Acuity Level 1  Time Spent with Patient 15

## 2017-06-29 ENCOUNTER — Encounter: Payer: Self-pay | Admitting: Internal Medicine

## 2017-06-29 ENCOUNTER — Ambulatory Visit (HOSPITAL_BASED_OUTPATIENT_CLINIC_OR_DEPARTMENT_OTHER): Payer: Medicare Other | Admitting: Internal Medicine

## 2017-06-29 ENCOUNTER — Encounter: Payer: Self-pay | Admitting: *Deleted

## 2017-06-29 ENCOUNTER — Other Ambulatory Visit: Payer: Self-pay | Admitting: Internal Medicine

## 2017-06-29 ENCOUNTER — Other Ambulatory Visit (HOSPITAL_BASED_OUTPATIENT_CLINIC_OR_DEPARTMENT_OTHER): Payer: Medicare Other

## 2017-06-29 VITALS — BP 200/70 | HR 83 | Temp 98.3°F | Resp 18 | Ht 71.0 in | Wt 239.7 lb

## 2017-06-29 DIAGNOSIS — F17200 Nicotine dependence, unspecified, uncomplicated: Secondary | ICD-10-CM

## 2017-06-29 DIAGNOSIS — R590 Localized enlarged lymph nodes: Secondary | ICD-10-CM

## 2017-06-29 DIAGNOSIS — Z801 Family history of malignant neoplasm of trachea, bronchus and lung: Secondary | ICD-10-CM | POA: Diagnosis not present

## 2017-06-29 DIAGNOSIS — Z7189 Other specified counseling: Secondary | ICD-10-CM

## 2017-06-29 DIAGNOSIS — R918 Other nonspecific abnormal finding of lung field: Secondary | ICD-10-CM

## 2017-06-29 DIAGNOSIS — Z5111 Encounter for antineoplastic chemotherapy: Secondary | ICD-10-CM

## 2017-06-29 DIAGNOSIS — C3492 Malignant neoplasm of unspecified part of left bronchus or lung: Secondary | ICD-10-CM

## 2017-06-29 DIAGNOSIS — R911 Solitary pulmonary nodule: Secondary | ICD-10-CM | POA: Diagnosis not present

## 2017-06-29 LAB — COMPREHENSIVE METABOLIC PANEL
ALT: 12 U/L (ref 0–55)
AST: 19 U/L (ref 5–34)
Albumin: 2.3 g/dL — ABNORMAL LOW (ref 3.5–5.0)
Alkaline Phosphatase: 134 U/L (ref 40–150)
Anion Gap: 8 mEq/L (ref 3–11)
BUN: 27.8 mg/dL — ABNORMAL HIGH (ref 7.0–26.0)
CO2: 29 mEq/L (ref 22–29)
Calcium: 8.7 mg/dL (ref 8.4–10.4)
Chloride: 102 mEq/L (ref 98–109)
Creatinine: 1.3 mg/dL (ref 0.7–1.3)
EGFR: >60 mL/min/{1.73_m2} (ref >60)
Glucose: 156 mg/dl — ABNORMAL HIGH (ref 70–140)
Potassium: 3.7 mEq/L (ref 3.5–5.1)
Sodium: 139 mEq/L (ref 136–145)
Total Bilirubin: 0.35 mg/dL (ref 0.20–1.20)
Total Protein: 6.9 g/dL (ref 6.4–8.3)

## 2017-06-29 LAB — CBC WITH DIFFERENTIAL/PLATELET
BASO%: 0.3 % (ref 0.0–2.0)
Basophils Absolute: 0 10*3/uL (ref 0.0–0.1)
EOS%: 1.4 % (ref 0.0–7.0)
Eosinophils Absolute: 0.1 10*3/uL (ref 0.0–0.5)
HCT: 31.9 % — ABNORMAL LOW (ref 38.4–49.9)
HGB: 9.8 g/dL — ABNORMAL LOW (ref 13.0–17.1)
LYMPH%: 15.8 % (ref 14.0–49.0)
MCH: 23.8 pg — ABNORMAL LOW (ref 27.2–33.4)
MCHC: 30.7 g/dL — ABNORMAL LOW (ref 32.0–36.0)
MCV: 77.6 fL — ABNORMAL LOW (ref 79.3–98.0)
MONO#: 0.5 10*3/uL (ref 0.1–0.9)
MONO%: 8.8 % (ref 0.0–14.0)
NEUT#: 4.3 10*3/uL (ref 1.5–6.5)
NEUT%: 73.7 % (ref 39.0–75.0)
Platelets: 244 10*3/uL (ref 140–400)
RBC: 4.11 10*6/uL — ABNORMAL LOW (ref 4.20–5.82)
RDW: 16.7 % — ABNORMAL HIGH (ref 11.0–14.6)
WBC: 5.8 10*3/uL (ref 4.0–10.3)
lymph#: 0.9 10*3/uL (ref 0.9–3.3)

## 2017-06-29 MED ORDER — PROCHLORPERAZINE MALEATE 10 MG PO TABS: 10.0000 mg | ORAL_TABLET | Freq: Four times a day (QID) | ORAL | 0 refills | Status: DC | PRN

## 2017-06-29 NOTE — Progress Notes (Signed)
START ON PATHWAY REGIMEN - Non-Small Cell Lung     Administer weekly:     Paclitaxel      Carboplatin   **Always confirm dose/schedule in your pharmacy ordering system**  Patient Characteristics: Stage III - Unresectable, PS = 0, 1 AJCC T Category: T1b Current Disease Status: No Distant Mets or Local Recurrence AJCC N Category: N2 AJCC M Category: M0 AJCC 8 Stage Grouping: IIIA Performance Status: PS = 0, 1 Intent of Therapy: Curative Intent, Discussed with Patient 

## 2017-06-29 NOTE — Progress Notes (Signed)
Oncology Nurse Navigator Documentation  Oncology Nurse Navigator Flowsheets 06/29/2017  Navigator Location [facility]  Navigator Encounter Type Clinic/MDC  Telephone Clinic/MDC Follow-up/I spoke with patient today during his first appt here at the cancer center.  I gave and explained information on smoking cessation and next steps with appt and treatment plan.  I will update Rad Onc of referral to help expedite appt.   Abnormal Finding Date 04/13/2017  Confirmed Diagnosis Date 06/26/2017  Patient Visit Type MedOnc  Treatment Phase Pre-Tx/Tx Discussion  Barriers/Navigation Needs Education;Coordination of Care  Education Smoking cessation;Other  Interventions Education;Coordination of Care  Coordination of Care Other  Education Method Verbal;Written  Acuity Level 2  Time Spent with Patient 57

## 2017-06-29 NOTE — Progress Notes (Signed)
Oncology Nurse Navigator Documentation  Oncology Nurse Navigator Flowsheets 06/29/2017  Navigator Location [facility]  Navigator Encounter Type Other/I contacted [facility] Pathology dept and requested PDL 1 testing to be completed on recent biopsy.  Barriers/Navigation Needs Coordination of Care  Interventions Coordination of Care  Coordination of Care Other  Acuity Level 1  Time Spent with Patient 15

## 2017-06-29 NOTE — Progress Notes (Signed)
[place] [phone]   [phone]  CONSULT NOTE  REFERRING PHYSICIAN: Dr. Lanelle Bal  REASON FOR CONSULTATION:  61 years old white male recently diagnosed with lung cancer  HPI Brad [name] is a 61 y.o. male with past medical history significant for multiple medical problems including history of COPD, hypertension, dyslipidemia, GERD, history of GI bleed, hypothyroidism, strokes x3 last one was 2 years ago, anemia, hepatitis C and depression.  The patient also has a long history of heavy smoking.  He mentioned that in early September 2018 he was complaining of pleuritic chest pain and he was seen by his pulmonologist Dr. Elsworth Soho.  Chest x-ray on 04/13/2017 showed vague opacity in the left upper lobe slightly more prominent compared to before questionable for area of scarring versus nodule.  This was followed by CT scan of the chest without contrast on the same day and that showed a 2.3 x 1.7 x 2.0 cm macrolobulated nodule in the left upper lobe highly concerning for primary bronchogenic neoplasm.  There was also mildly enlarged prevascular lymph node measuring 1.3 cm in short axis, and 1.3 cm low right paratracheal lymph node.  A PET scan was performed on 04/25/2017 and it showed hypermetabolic nodule in the left upper lobe most consistent with primary bronchogenic carcinoma.  There was mildly enlarged prevascular lymph node with minimal metabolic activity. The patient underwent fiberoptic bronchoscopy with electromagnetic navigational bronchoscopy and endobronchial ultrasound with transbronchial needle aspiration under the care of Dr. Elsworth Soho on 05/25/2017 but the final pathology was not conclusive for malignancy.  MRI of the brain on June 23, 2017 was negative for metastatic disease to the brain. On June 26, 2017 the patient underwent video bronchoscopy with endobronchial navigation and placement of fiducial under the care of Dr. Servando Snare. The  final pathology ([id number]) was consistent with squamous cell carcinoma. Dr. Servando Snare kindly referred the patient to the clinic today for further evaluation and recommendation regarding treatment of his condition. When seen today the patient is feeling fine with no specific complaints.  He denied having any chest pain, shortness breath, cough or hemoptysis.  He denied having any weight loss or night sweats.  He has no headache or visual changes.  He has no nausea, vomiting, diarrhea or constipation. Family history significant for father and sister with lung cancer and mother had diabetes mellitus. The patient is single and has no children.  He is currently on disability secondary to shoulder injury.  He has a history of smoking up to 2 pack/day for around 46 years and he continues to smoke at least one pack per day now.  He has no history of alcohol or drug abuse.  HPI  Past Medical History:  Diagnosis Date  . Anemia    takes Ferrous Sulfated daily  . Arthritis   . Carotid stenosis 08/27/2014   takes Aspirin daily  . Chronic hepatitis C ([place]) 09/17/2015  . COPD (chronic obstructive pulmonary disease) (HCC)    Albuterol daily as needed and Spiriva daily  . Depression   . Diabetes mellitus type II    takes Metformin and Glipizide daily  . Dizziness   . GERD (gastroesophageal reflux disease)   . GI bleed 12/2015  . Headache    occasionally  . History of blood clots    in right leg;being followed by Dr.Brabham for this  . History of blood transfusion 09/2014   2 units-no abnormal reaction noted  . History of gastric ulcer   .  History of hiatal hernia   . HTN (hypertension) 09/14/2015  . Hyperlipidemia 08/27/2014   takes Simvastatin daily  . Hypertension    takes Amlodipine and Lisinopril daily  . Hypothyroidism 08/27/2014   takes Synthroid daily  . Insomnia    takes Pamelor nightly  . Joint pain    hips  . Muscle spasm    takes Zanaflex daily as needed  . MVP (mitral valve prolapse)    . Peripheral edema    takes Furosemide daily as needed  . Peripheral neuropathy   . Prurigo nodularis   . Shortness of breath dyspnea   . Stroke ([place])   . Urinary frequency   . Urinary urgency     Past Surgical History:  Procedure Laterality Date  . ENDARTERECTOMY Left 11/13/2014   Procedure: ENDARTERECTOMY CAROTID WITH PATCH ANGIOPLASTY;  Surgeon: Serafina Mitchell, MD;  Location: [facility] OR;  Service: Vascular;  Laterality: Left;  . ESOPHAGOGASTRODUODENOSCOPY N/A 09/24/2014   Procedure: ESOPHAGOGASTRODUODENOSCOPY (EGD);  Surgeon: Winfield Cunas., MD;  Location: [facility] ENDOSCOPY;  Service: Endoscopy;  Laterality: N/A;  . FUDUCIAL PLACEMENT N/A 06/26/2017   Procedure: PLACEMENT OF FUDUCIAL;  Surgeon: Grace Isaac, MD;  Location: [place];  Service: Thoracic;  Laterality: N/A;  . I&D of right arm    . INCISION AND DRAINAGE PERIRECTAL ABSCESS  03/2009  . Neg Stress Test  2000  . PERIPHERAL VASCULAR CATHETERIZATION N/A 11/30/2015   Procedure: Carotid PTA/Stent Intervention;  Surgeon: Serafina Mitchell, MD;  Location: [place] CV LAB;  Service: Cardiovascular;  Laterality: N/A;  . ROTATOR CUFF REPAIR Right 2009   multiple f/u Sxs/infection Tx  . VASCULAR SURGERY    . VIDEO BRONCHOSCOPY WITH ENDOBRONCHIAL NAVIGATION N/A 05/25/2017   Procedure: VIDEO BRONCHOSCOPY WITH ENDOBRONCHIAL NAVIGATION;  Surgeon: Rigoberto Noel, MD;  Location: [place];  Service: Thoracic;  Laterality: N/A;  . VIDEO BRONCHOSCOPY WITH ENDOBRONCHIAL NAVIGATION N/A 06/26/2017   Procedure: VIDEO BRONCHOSCOPY WITH ENDOBRONCHIAL NAVIGATION WITH PLACEMENT OF FIDUCIALS;  Surgeon: Grace Isaac, MD;  Location: [place];  Service: Thoracic;  Laterality: N/A;  . VIDEO BRONCHOSCOPY WITH ENDOBRONCHIAL ULTRASOUND N/A 05/25/2017   Procedure: VIDEO BRONCHOSCOPY WITH ENDOBRONCHIAL ULTRASOUND;  Surgeon: Rigoberto Noel, MD;  Location: [facility] OR;  Service: Thoracic;  Laterality: N/A;    Family History  Problem Relation Age of Onset  . Heart  disease Mother        automated implantable cardioverter-defibrillator   . Diabetes type II Mother   . Alcohol abuse Father   . Lung cancer Father   . Lung cancer Maternal Grandmother   . Lung cancer Sister     Social History Social History   Tobacco Use  . Smoking status: Current Every Day Smoker    Packs/day: 1.00    Years: 48.00    Pack years: 48.00    Types: Cigarettes  . Smokeless tobacco: Never Used  . Tobacco comment: 1-2 pks per day  Substance Use Topics  . Alcohol use: Yes    Alcohol/week: 0.6 oz    Types: 1 Cans of beer per week    Comment: rarely  . Drug use: No    Allergies  Allergen Reactions  . Doxycycline Hives and Itching  . Lipitor [Atorvastatin] Other (See Comments)    Myalgia   . Oxycodone Itching    Current Outpatient Medications  Medication Sig Dispense Refill  . aspirin EC 81 MG tablet Take 1 tablet (81 mg total) by mouth daily. 90 tablet 11  .  ATROVENT HFA 17 MCG/ACT inhaler INHALE 2 PUFFS INTO THE LUNGS FOUR TIMES DAILY (Patient taking differently: INHALE 2 PUFFS INTO THE LUNGS 2-3 TIMES DAILY) 38.7 g 0  . ferrous sulfate 325 (65 FE) MG tablet Take 1 tablet (325 mg total) by mouth 3 (three) times daily with meals. (Patient taking differently: Take 325 mg 2 (two) times daily by mouth. Once to twice daily) 30 tablet 3  . furosemide (LASIX) 80 MG tablet Take 1 tablet (80 mg total) daily by mouth. 90 tablet 3  . gabapentin (NEURONTIN) 600 MG tablet TAKE 1 TABLET(600 MG) BY MOUTH THREE TIMES DAILY (Patient taking differently: Take 600 mg 3 (three) times daily as needed by mouth (for nerve pain/neuropathy.). TAKE 1 TABLET(600 MG) BY MOUTH THREE TIMES DAILY) 270 tablet 1  . lisinopril (PRINIVIL,ZESTRIL) 20 MG tablet Take 1 tablet (20 mg total) daily by mouth. 90 tablet 3  . metolazone (ZAROXOLYN) 5 MG tablet Take 1 tablet (5 mg total) daily as needed by mouth. 90 tablet 3  . potassium chloride (K-DUR) 10 MEQ tablet TAKE 2 TABLETS BY MOUTH EVERY DAY IN  THE MORNING 180 tablet 3  . Respiratory Therapy Supplies (NEBULIZER) DEVI Use as directed four times daily 1 each 0  . umeclidinium-vilanterol (ANORO ELLIPTA) 62.5-25 MCG/INH AEPB Inhale 1 puff into the lungs daily. 1 each 3  . amLODipine (NORVASC) 5 MG tablet Take 1 tablet (5 mg total) daily by mouth. (Patient not taking: Reported on 06/29/2017) 90 tablet 3  . Blood Pressure Monitor DEVI Use to check Blood pressure daily 1 Device 0  . glucose blood (ACCU-CHEK AVIVA PLUS) test strip Use to check blood sugars daily Dx E11.9 100 each 3  . Lancets MISC 1 each by Other route See admin instructions. Reported on 09/07/2015    . levothyroxine (SYNTHROID, LEVOTHROID) 112 MCG tablet Take 1 tablet (112 mcg total) daily by mouth. (Patient not taking: Reported on 06/29/2017) 90 tablet 3  . tiZANidine (ZANAFLEX) 4 MG tablet Take 1 tablet (4 mg total) every 6 (six) hours as needed by mouth for muscle spasms. (Patient not taking: Reported on 06/29/2017) 60 tablet 2   No current facility-administered medications for this visit.     Review of Systems  Constitutional: negative Eyes: negative Ears, nose, mouth, throat, and face: negative Respiratory: negative Cardiovascular: negative Gastrointestinal: negative Genitourinary:negative Integument/breast: negative Hematologic/lymphatic: negative Musculoskeletal:negative Neurological: negative Behavioral/Psych: negative Endocrine: negative Allergic/Immunologic: negative  Physical Exam  LTJ:QZESP, healthy, no distress, well nourished and well developed SKIN: skin color, texture, turgor are normal, no rashes or significant lesions HEAD: Normocephalic, No masses, lesions, tenderness or abnormalities EYES: normal, PERRLA, Conjunctiva are pink and non-injected EARS: External ears normal, Canals clear OROPHARYNX:no exudate, no erythema and lips, buccal mucosa, and tongue normal  NECK: supple, no adenopathy, no JVD LYMPH:  no palpable lymphadenopathy, no  hepatosplenomegaly LUNGS: clear to auscultation , and palpation HEART: regular rate & rhythm, no murmurs and no gallops ABDOMEN:abdomen soft, non-tender, obese, normal bowel sounds and no masses or organomegaly BACK: Back symmetric, no curvature., No CVA tenderness EXTREMITIES:no joint deformities, effusion, or inflammation, no edema  NEURO: alert & oriented x 3 with fluent speech, no focal motor/sensory deficits  PERFORMANCE STATUS: ECOG 1  LABORATORY DATA: Lab Results  Component Value Date   WBC 5.8 06/29/2017   HGB 9.8 (L) 06/29/2017   HCT 31.9 (L) 06/29/2017   MCV 77.6 (L) 06/29/2017   PLT 244 06/29/2017      Chemistry  Component Value Date/Time   NA 139 06/29/2017 0846   K 3.7 06/29/2017 0846   CL 104 06/26/2017 0622   CO2 29 06/29/2017 0846   BUN 27.8 (H) 06/29/2017 0846   CREATININE 1.3 06/29/2017 0846      Component Value Date/Time   CALCIUM 8.7 06/29/2017 0846   ALKPHOS 134 06/29/2017 0846   AST 19 06/29/2017 0846   ALT 12 06/29/2017 0846   BILITOT 0.35 06/29/2017 0846       RADIOGRAPHIC STUDIES: Dg Chest 2 View  Result Date: 06/26/2017 CLINICAL DATA:  Preoperative exam for left lung biopsy. EXAM: CHEST  2 VIEW COMPARISON:  05/25/2017. FINDINGS: Mediastinum hilar structures are stable. Stable mild cardiomegaly. No pulmonary venous congestion. Assistant density/infiltrate left upper lung. Tiny left pleural effusion cannot be excluded. No pneumothorax. Surgical clips left chest. IMPRESSION: Persistent density/infiltrate left upper lung. Tiny left pleural effusion cannot be excluded. Electronically Signed   By: Marcello Moores  Register   On: 06/26/2017 07:01   Mr Brain W Wo Contrast  Result Date: 06/23/2017 CLINICAL DATA:  Non intractable headache. Malignant neoplasm of the lung. Brain metastases. EXAM: MRI HEAD WITHOUT AND WITH CONTRAST TECHNIQUE: Multiplanar, multiecho pulse sequences of the brain and surrounding structures were obtained without and with  intravenous contrast. CONTRAST:  10mL MULTIHANCE GADOBENATE DIMEGLUMINE 529 MG/ML IV SOLN COMPARISON:  MRI brain 09/27/2015. FINDINGS: Brain: Remote infarcts of the cerebellum are noted. Remote blood products are present on the right. Remote infarcts are present within the thalamus bilaterally. Mild periventricular and subcortical white matter disease is progressive. No acute hemorrhage is present. White matter changes extend into the brainstem. The cerebellum is unremarkable. The internal auditory canals are normal bilaterally. The postcontrast images demonstrate no pathologic enhancement. Vascular: Normal flow voids. Skull and upper cervical spine: Flow is present in the major intracranial arteries. Sinuses/Orbits: The skullbase is within normal limits. The craniocervical junction is within normal limits. IMPRESSION: 1. No evidence for metastatic disease of the brain or meninges. 2. Remote infarcts of the basal ganglia and thalamus bilaterally. 3. No acute infarct. Electronically Signed   By: San Morelle M.D.   On: 06/23/2017 15:25   Dg C-arm Bronchoscopy  Result Date: 06/26/2017 C-ARM BRONCHOSCOPY: Fluoroscopy was utilized by the requesting physician.  No radiographic interpretation.    ASSESSMENT: This is a very pleasant 62 years old white male with questionable stage IA (T1b, N0, M0) versus IIIA (T1b, N2, M0) non-small cell lung cancer, squamous cell carcinoma presented with left upper lobe lung nodule as well as suspicious left prevascular lymph node diagnosed in November 2018   PLAN: I had a lengthy discussion with the patient today about his current disease is stage, prognosis and treatment options.  I personally and independently reviewed his scan images and showed the patient the images today. I also discussed with the patient options for treatment of his condition including stereotactic radiotherapy to the left upper lobe lung nodule followed by close monitoring and observation for  the prevascular lymph node versus consideration of a course of concurrent chemoradiation with weekly carboplatin for [facility] of 2 and paclitaxel 45 mg/M2 to the left upper lobe lung nodule in addition to the prevascular lymphadenopathy. I discussed with the patient the adverse effect of the chemotherapy including but not limited to alopecia, myelosuppression, nausea and vomiting, peripheral neuropathy, liver or renal dysfunction. I would refer the patient to radiation oncology for evaluation and discussion of his treatment options.  If radiation oncology would consider the course of concurrent chemoradiation,  he will start the first dose of his chemotherapy on July 16, 2017. I will also arrange for the patient to have a chemotherapy education class before his treatment. He will come back for follow-up visit at that time. I would call his pharmacy with prescription for Compazine 10 mg p.o. every 6 hours as needed for nausea. The patient was advised to call immediately if he has any concerning symptoms in the interval. The patient voices understanding of current disease status and treatment options and is in agreement with the current care plan.  All questions were answered. The patient knows to call the clinic with any problems, questions or concerns. We can certainly see the patient much sooner if necessary.  Thank you so much for allowing me to participate in the care of Brad [name]. I will continue to follow up the patient with you and assist in his care.  I spent 55 minutes counseling the patient face to face. The total time spent in the appointment was 80 minutes.  Disclaimer: This note was dictated with voice recognition software. Similar sounding words can inadvertently be transcribed and may not be corrected upon review.   Eilleen Kempf June 29, 2017, 9:27 AM

## 2017-07-02 NOTE — Op Note (Signed)
NAME:  Brad [name], Brad [name]                  ACCOUNT NO.:  MEDICAL RECORD NO.:  [id number]  LOCATION:                                 FACILITY:  PHYSICIAN:  Lanelle Bal, MD         DATE OF BIRTH:  DATE OF PROCEDURE:  06/26/2017 DATE OF DISCHARGE:                              OPERATIVE REPORT   POSTOPERATIVE DIAGNOSIS:  Left lung mass.  PREOPERATIVE DIAGNOSIS:  Left lung mass.  Non-small cell carcinoma of the lung by quick stain.  PROCEDURE PERFORMED:  Navigation bronchoscopy with transbronchial biopsy of the left upper lobe lung mass and placement of additional markers x3, left lung.  SURGEON:  Lanelle Bal, MD.  BRIEF HISTORY:  The patient is a 61 year old male with known underlying obstructive pulmonary disease, who has been evaluated by the Pulmonary Service, but no specific diagnosis was obtained in spite of attempts at biopsy.  The patient had a 4-cm mass in the left upper lobe with associated satellite lesion.  Primary resection after evaluation with pulmonary function studies and PET scan has been considered, however, the patient's pulmonary and functional status is somewhat limited.  He could not even complete a 4-minute walk test in the office without stopping.  For this reason, we recommend to him that we obtain a tissue diagnosis as we consider treatment options including possible radiation therapy.  Risks and options of navigation were discussed with the patient in detail.  He previously had this attempted by the Pulmonary Service several weeks before, but without a tissue diagnosis.  The patient agreed and signed informed consent.  DESCRIPTION OF PROCEDURE:  With central line in place with IV access in place, the patient underwent endotracheal anesthesia with an 8.5-French endotracheal tube.  Appropriate time-out was performed.  With the patient under general anesthesia, we then proceeded first with standard bronchoscopy to the subsegmental level of both the  right and left tracheobronchial tree without evidence of endobronchial lesion. Preoperatively, a plan had been developed for the navigation system targeting the mass in the left upper lobe.  This plan had been placed in the OR computer setup.  Appropriate sensors had been placed on the patient.  We then placed the LG with sensor through the bronchoscope and registered the plan to the patient.  In addition, a sweep with fluoro was added to improve the sensitive of the navigation process.  With the baseline established, we then followed the preoperative plan toward the left upper lobe lesion within 1 cm of the mass.  The fluoroscopic sweep data was used to correct the location and we were easily able to obtain multiple brushings and biopsies of the left lung lesion.  The initial smear of the brushings was consistent with non-small cell carcinoma.  We then placed the LG probe back and adjusted the software for fiducial replacements.  We mapped the left lung further.  We then placed a 3 small gold fiducials under fluoroscopic guidance at the sites selective by the software plan.  With this completed, no evidence of pneumothorax on fluoroscopy, all secretions were very from the tracheobronchial tree. The patient was then extubated in the operating room.  Blood loss  was minimal.  He tolerated the procedure without obvious complication, was transferred to the recovery room for further postoperative care.     Lanelle Bal, MD     EG/MEDQ  D:  07/01/2017  T:  07/01/2017  Job:  [id number]

## 2017-07-03 ENCOUNTER — Encounter: Payer: Self-pay | Admitting: Radiation Oncology

## 2017-07-05 ENCOUNTER — Telehealth: Payer: Self-pay | Admitting: *Deleted

## 2017-07-05 NOTE — Telephone Encounter (Signed)
No additional note

## 2017-07-05 NOTE — Telephone Encounter (Signed)
"  I just received a call about an appointment there Monday.  I'm scheduled to come in Wednesday not Monday."  Reviewed Education Class was scheduled and needed before first chemotherapy scheduled July 16, 2017.  "Monday is a ruff day.  It's payday, have bills to pay.  Late afternoon would be better.  I'll come in." Routing call information to Education nurse for review.  Further patient communication through education nurse.

## 2017-07-09 ENCOUNTER — Other Ambulatory Visit: Payer: Medicare Other

## 2017-07-10 NOTE — Progress Notes (Signed)
Thoracic Location of Tumor / Histology: questionable stage IA (T1b, N0, M0) versus IIIA (T1b, N2, M0) non-small cell lung cancer, squamous cell carcinoma presented with left upper lobe lung nodule as well as suspicious left prevascular lymph node diagnosed in November 2018  Patient with heavy smoking hx presented to Dr. Elsworth Soho in early September complaining of pleuritic chest pain.   Biopsies of left upper lobe lung lesion 4 cm in size (if applicable) revealed:    PDL 1 testing requested by [facility] on 06/29/2017  Tobacco/Marijuana/Snuff/ETOH use: Current everyday smoker of 1 PPD.  Past/Anticipated interventions by cardiothoracic surgery, if any: Not a surgical candidate  Past/Anticipated interventions by medical oncology, if any: course of concurrent chemoradiation with weekly carboplatin for [facility] of 2 and paclitaxel 45 mg/M2 to the left upper lobe lung nodule in addition to the prevascular lymphadenopathy to begin December 10th.     Signs/Symptoms  Weight changes, if any: 4 years ago patient weighed 267 lb. On 07/09/2017 he weighed 239 lb but, today weighs 246.8 lb. Patient reports he did not take his furosemide 80 mg yesterday.  Respiratory complaints, if any: SOB with exertion. Productive cough in the early morning and late evening with thick white blood tinged sputum. Reports new onset sore throat. Patient's voice is raspy. Denies difficulty swallowing. Patient is able to lay flat to sleep.   Hemoptysis, if any: yes  Pain issues, if any:  Pleuritic pain resolved  SAFETY ISSUES:  Prior radiation? no  Pacemaker/ICD? no   Possible current pregnancy?no  Is the patient on methotrexate? no  Current Complaints / other details:  61 year old white male. Single.  Disabled since 1997. PCP, [name] at [place].

## 2017-07-11 ENCOUNTER — Ambulatory Visit
Admission: RE | Admit: 2017-07-11 | Discharge: 2017-07-11 | Disposition: A | Discharge status: Home / Self Care | Payer: Medicare Other | Source: Ambulatory Visit | Attending: Radiation Oncology | Admitting: Radiation Oncology

## 2017-07-11 ENCOUNTER — Encounter: Payer: Self-pay | Admitting: Radiation Oncology

## 2017-07-11 ENCOUNTER — Other Ambulatory Visit: Payer: Self-pay

## 2017-07-11 VITALS — BP 196/75 | HR 58 | Temp 98.0°F | Resp 18 | Wt 246.8 lb

## 2017-07-11 DIAGNOSIS — C3412 Malignant neoplasm of upper lobe, left bronchus or lung: Secondary | ICD-10-CM | POA: Diagnosis not present

## 2017-07-11 DIAGNOSIS — I10 Essential (primary) hypertension: Secondary | ICD-10-CM | POA: Diagnosis not present

## 2017-07-11 DIAGNOSIS — Z9221 Personal history of antineoplastic chemotherapy: Secondary | ICD-10-CM | POA: Diagnosis not present

## 2017-07-11 DIAGNOSIS — Z801 Family history of malignant neoplasm of trachea, bronchus and lung: Secondary | ICD-10-CM | POA: Insufficient documentation

## 2017-07-11 DIAGNOSIS — B182 Chronic viral hepatitis C: Secondary | ICD-10-CM | POA: Insufficient documentation

## 2017-07-11 DIAGNOSIS — E785 Hyperlipidemia, unspecified: Secondary | ICD-10-CM | POA: Insufficient documentation

## 2017-07-11 DIAGNOSIS — R3915 Urgency of urination: Secondary | ICD-10-CM | POA: Diagnosis not present

## 2017-07-11 DIAGNOSIS — M2550 Pain in unspecified joint: Secondary | ICD-10-CM | POA: Insufficient documentation

## 2017-07-11 DIAGNOSIS — F329 Major depressive disorder, single episode, unspecified: Secondary | ICD-10-CM | POA: Diagnosis not present

## 2017-07-11 DIAGNOSIS — L281 Prurigo nodularis: Secondary | ICD-10-CM | POA: Insufficient documentation

## 2017-07-11 DIAGNOSIS — F17210 Nicotine dependence, cigarettes, uncomplicated: Secondary | ICD-10-CM | POA: Insufficient documentation

## 2017-07-11 DIAGNOSIS — Z8711 Personal history of peptic ulcer disease: Secondary | ICD-10-CM | POA: Insufficient documentation

## 2017-07-11 DIAGNOSIS — E039 Hypothyroidism, unspecified: Secondary | ICD-10-CM | POA: Insufficient documentation

## 2017-07-11 DIAGNOSIS — D649 Anemia, unspecified: Secondary | ICD-10-CM | POA: Diagnosis not present

## 2017-07-11 DIAGNOSIS — K219 Gastro-esophageal reflux disease without esophagitis: Secondary | ICD-10-CM | POA: Diagnosis not present

## 2017-07-11 DIAGNOSIS — G4700 Insomnia, unspecified: Secondary | ICD-10-CM | POA: Insufficient documentation

## 2017-07-11 DIAGNOSIS — M129 Arthropathy, unspecified: Secondary | ICD-10-CM | POA: Insufficient documentation

## 2017-07-11 DIAGNOSIS — Z8719 Personal history of other diseases of the digestive system: Secondary | ICD-10-CM | POA: Diagnosis not present

## 2017-07-11 DIAGNOSIS — Z8673 Personal history of transient ischemic attack (TIA), and cerebral infarction without residual deficits: Secondary | ICD-10-CM | POA: Insufficient documentation

## 2017-07-11 DIAGNOSIS — R42 Dizziness and giddiness: Secondary | ICD-10-CM | POA: Diagnosis not present

## 2017-07-11 DIAGNOSIS — J449 Chronic obstructive pulmonary disease, unspecified: Secondary | ICD-10-CM | POA: Diagnosis not present

## 2017-07-11 DIAGNOSIS — Z7982 Long term (current) use of aspirin: Secondary | ICD-10-CM | POA: Diagnosis not present

## 2017-07-11 DIAGNOSIS — I6529 Occlusion and stenosis of unspecified carotid artery: Secondary | ICD-10-CM | POA: Insufficient documentation

## 2017-07-11 DIAGNOSIS — G629 Polyneuropathy, unspecified: Secondary | ICD-10-CM | POA: Diagnosis not present

## 2017-07-11 DIAGNOSIS — Z510 Encounter for antineoplastic radiation therapy: Secondary | ICD-10-CM | POA: Diagnosis not present

## 2017-07-11 DIAGNOSIS — D381 Neoplasm of uncertain behavior of trachea, bronchus and lung: Secondary | ICD-10-CM

## 2017-07-11 DIAGNOSIS — I341 Nonrheumatic mitral (valve) prolapse: Secondary | ICD-10-CM | POA: Insufficient documentation

## 2017-07-11 DIAGNOSIS — E119 Type 2 diabetes mellitus without complications: Secondary | ICD-10-CM | POA: Insufficient documentation

## 2017-07-11 NOTE — Progress Notes (Signed)
Radiation Oncology         [phone] ________________________________  Initial Outpatient Consultation  Name: GILLIAM HAWKES MRN: [id number]  Date of Service: 07/11/2017 DOB: [birth year]  WK:GSUP, Hunt Oris, MD  Curt Bears, MD   REFERRING PHYSICIAN: Curt Bears, MD  DIAGNOSIS: 61 y.o.  Gentleman with newly diagnosed Stage III (TIbN1M0) NSCLC, squamous cell carcinoma of the left upper lobe     ICD-10-CM   1. Primary cancer of left upper lobe of lung ([place]) C34.12     HISTORY OF PRESENT ILLNESS: Brad TEBBETTS is a 61 y.o. male seen at the request of Dr. Julien Nordmann.  He presented to his pulmonologist, Dr. Elsworth Soho in 04/2017 with c/o pleuritic chest pain.  A CXR was performed for further evaluation and revealed a vague opacity in the LUL.  This was further evaluated with Chest CT on 04/13/17 which revealed a 2.3 x 1.7 x 2.0 cm left upper lobe nodule, highly concerning for primary bronchogenic neoplasm. There was also bilateral mediastinal lymphadenopathy, which was suspicious for metastatic disease, as well as a small left pleural effusion.   He underwent PET imaging on 04/25/17 which demonstrated a 2.5 cm hypermetabolic left upper lobe nodule with SUV max of 10.5. No clearly hypermetabolic mediastinal lymph nodes. Prevascular lymph node measuring 10 mm with minimal metabolic activity (SUV 2.3) was favored benign. No hypermetabolic lymph nodes in the abdomen or pelvis and no focal hypermetabolic activity to suggest skeletal metastasis.    Dr. Elsworth Soho performed Bronchoscopy with EBUS on 05/25/17 which unfortunately was nondiagnostic.  He had a brain MRI on 06/23/17 which was negative for metastatic disease.  He underwent Navigational bronchoscopy with Dr. Servando Snare on 06/26/17 with pathology confirming NSCLC, squamous cell carcinoma.  He met with Dr. Julien Nordmann on 06/29/2017 to discuss his treatment options with recommendations including stereotactic radiotherapy to the left upper lobe lung  nodule followed by close monitoring and observation for the prevascular lymph node, versus consideration of a course of concurrent chemoradiation with weekly carboplatin for [facility] of 2 and paclitaxel 45 mg/M2 to the left upper lobe lung nodule in addition to the prevascular lymphadenopathy. He is tentatively planned to start chemotherapy on 07/16/2017 pending recommendations from today's consult visit.  He is referred today for further discussion and consideration of radiotherapy in the treatment and management of his disease.  PREVIOUS RADIATION THERAPY: No  PAST MEDICAL HISTORY:  Past Medical History:  Diagnosis Date  . Anemia    takes Ferrous Sulfated daily  . Arthritis   . Carotid stenosis 08/27/2014   takes Aspirin daily  . Chronic hepatitis C ([place]) 09/17/2015  . COPD (chronic obstructive pulmonary disease) (HCC)    Albuterol daily as needed and Spiriva daily  . Depression   . Diabetes mellitus type II    takes Metformin and Glipizide daily  . Dizziness   . GERD (gastroesophageal reflux disease)   . GI bleed 12/2015  . Headache    occasionally  . History of blood clots    in right leg;being followed by Dr.Brabham for this  . History of blood transfusion 09/2014   2 units-no abnormal reaction noted  . History of gastric ulcer   . History of hiatal hernia   . HTN (hypertension) 09/14/2015  . Hyperlipidemia 08/27/2014   takes Simvastatin daily  . Hypertension    takes Amlodipine and Lisinopril daily  . Hypothyroidism 08/27/2014   takes Synthroid daily  . Insomnia    takes Pamelor nightly  . Joint pain  hips  . Muscle spasm    takes Zanaflex daily as needed  . MVP (mitral valve prolapse)   . Peripheral edema    takes Furosemide daily as needed  . Peripheral neuropathy   . Prurigo nodularis   . Shortness of breath dyspnea   . Stroke ([place])   . Urinary frequency   . Urinary urgency       PAST SURGICAL HISTORY: Past Surgical History:  Procedure Laterality Date  .  ENDARTERECTOMY Left 11/13/2014   Procedure: ENDARTERECTOMY CAROTID WITH PATCH ANGIOPLASTY;  Surgeon: Serafina Mitchell, MD;  Location[facility] OR;  Service: Vascular;  Laterality: Left;  . ESOPHAGOGASTRODUODENOSCOPY N/A 09/24/2014   Procedure: ESOPHAGOGASTRODUODENOSCOPY (EGD);  Surgeon: Winfield Cunas., MD;  Location: [facility] ENDOSCOPY;  Service: Endoscopy;  Laterality: N/A;  . FUDUCIAL PLACEMENT N/A 06/26/2017   Procedure: PLACEMENT OF FUDUCIAL;  Surgeon: Grace Isaac, MD;  Location: [place];  Service: Thoracic;  Laterality: N/A;  . I&D of right arm    . INCISION AND DRAINAGE PERIRECTAL ABSCESS  03/2009  . Neg Stress Test  2000  . PERIPHERAL VASCULAR CATHETERIZATION N/A 11/30/2015   Procedure: Carotid PTA/Stent Intervention;  Surgeon: Serafina Mitchell, MD;  Location: [place] CV LAB;  Service: Cardiovascular;  Laterality: N/A;  . ROTATOR CUFF REPAIR Right 2009   multiple f/u Sxs/infection Tx  . VASCULAR SURGERY    . VIDEO BRONCHOSCOPY WITH ENDOBRONCHIAL NAVIGATION N/A 05/25/2017   Procedure: VIDEO BRONCHOSCOPY WITH ENDOBRONCHIAL NAVIGATION;  Surgeon: Rigoberto Noel, MD;  Location: [place];  Service: Thoracic;  Laterality: N/A;  . VIDEO BRONCHOSCOPY WITH ENDOBRONCHIAL NAVIGATION N/A 06/26/2017   Procedure: VIDEO BRONCHOSCOPY WITH ENDOBRONCHIAL NAVIGATION WITH PLACEMENT OF FIDUCIALS;  Surgeon: Grace Isaac, MD;  Location: [place];  Service: Thoracic;  Laterality: N/A;  . VIDEO BRONCHOSCOPY WITH ENDOBRONCHIAL ULTRASOUND N/A 05/25/2017   Procedure: VIDEO BRONCHOSCOPY WITH ENDOBRONCHIAL ULTRASOUND;  Surgeon: Rigoberto Noel, MD;  Location: [facility] OR;  Service: Thoracic;  Laterality: N/A;    FAMILY HISTORY:  Family History  Problem Relation Age of Onset  . Heart disease Mother        automated implantable cardioverter-defibrillator   . Diabetes type II Mother   . Alcohol abuse Father   . Lung cancer Father   . Lung cancer Maternal Grandmother   . Lung cancer Sister     SOCIAL HISTORY:  Social  History   Socioeconomic History  . Marital status: Single    Spouse name: Not on file  . Number of children: 0  . Years of education: Not on file  . Highest education level: Not on file  Social Needs  . Financial resource strain: Not on file  . Food insecurity - worry: Not on file  . Food insecurity - inability: Not on file  . Transportation needs - medical: Not on file  . Transportation needs - non-medical: Not on file  Occupational History  . Occupation: Disabled  Tobacco Use  . Smoking status: Current Every Day Smoker    Packs/day: 1.00    Years: 48.00    Pack years: 48.00    Types: Cigarettes  . Smokeless tobacco: Never Used  . Tobacco comment: 1-2 pks per day  Substance and Sexual Activity  . Alcohol use: Yes    Alcohol/week: 0.6 oz    Types: 1 Cans of beer per week    Comment: rarely  . Drug use: No  . Sexual activity: Yes  Other Topics Concern  . Not on  file  Social History Narrative   Divorced with fiance.   Disabled since June 2010 - right shoulder   No children.          ALLERGIES: Doxycycline; Lipitor [atorvastatin]; and Oxycodone  MEDICATIONS:  Current Outpatient Medications  Medication Sig Dispense Refill  . aspirin EC 81 MG tablet Take 1 tablet (81 mg total) by mouth daily. 90 tablet 11  . ATROVENT HFA 17 MCG/ACT inhaler INHALE 2 PUFFS INTO THE LUNGS FOUR TIMES DAILY (Patient taking differently: INHALE 2 PUFFS INTO THE LUNGS 2-3 TIMES DAILY) 38.7 g 0  . Blood Pressure Monitor DEVI Use to check Blood pressure daily 1 Device 0  . ferrous sulfate 325 (65 FE) MG tablet Take 1 tablet (325 mg total) by mouth 3 (three) times daily with meals. (Patient taking differently: Take 325 mg 2 (two) times daily by mouth. Once to twice daily) 30 tablet 3  . furosemide (LASIX) 80 MG tablet Take 1 tablet (80 mg total) daily by mouth. 90 tablet 3  . gabapentin (NEURONTIN) 600 MG tablet TAKE 1 TABLET(600 MG) BY MOUTH THREE TIMES DAILY (Patient taking differently: Take 600  mg 3 (three) times daily as needed by mouth (for nerve pain/neuropathy.). TAKE 1 TABLET(600 MG) BY MOUTH THREE TIMES DAILY) 270 tablet 1  . lisinopril (PRINIVIL,ZESTRIL) 20 MG tablet Take 1 tablet (20 mg total) daily by mouth. 90 tablet 3  . metolazone (ZAROXOLYN) 5 MG tablet Take 1 tablet (5 mg total) daily as needed by mouth. 90 tablet 3  . potassium chloride (K-DUR) 10 MEQ tablet TAKE 2 TABLETS BY MOUTH EVERY DAY IN THE MORNING 180 tablet 3  . Respiratory Therapy Supplies (NEBULIZER) DEVI Use as directed four times daily 1 each 0  . tiZANidine (ZANAFLEX) 4 MG tablet Take 1 tablet (4 mg total) every 6 (six) hours as needed by mouth for muscle spasms. 60 tablet 2  . umeclidinium-vilanterol (ANORO ELLIPTA) 62.5-25 MCG/INH AEPB Inhale 1 puff into the lungs daily. 1 each 3  . amLODipine (NORVASC) 5 MG tablet Take 1 tablet (5 mg total) daily by mouth. (Patient not taking: Reported on 07/11/2017) 90 tablet 3  . glucose blood (ACCU-CHEK AVIVA PLUS) test strip Use to check blood sugars daily Dx E11.9 (Patient not taking: Reported on 07/11/2017) 100 each 3  . Lancets MISC 1 each by Other route See admin instructions. Reported on 09/07/2015    . levothyroxine (SYNTHROID, LEVOTHROID) 112 MCG tablet Take 1 tablet (112 mcg total) daily by mouth. (Patient not taking: Reported on 06/29/2017) 90 tablet 3  . prochlorperazine (COMPAZINE) 10 MG tablet TAKE 1 TABLET(10 MG) BY MOUTH EVERY 6 HOURS AS NEEDED FOR NAUSEA OR VOMITING (Patient not taking: Reported on 07/11/2017) 385 tablet 0   No current facility-administered medications for this encounter.     REVIEW OF SYSTEMS:  On review of systems, the patient reports that he is doing well overall. He denies any chest pain, fevers, chills, or night sweats. He reports his pleuritic pain has resolved. He has chronic shortness of breath with exertion, unchanged recently. He reports a productive cough in the early morning and late evening with thick, white, blood-tinged sputum.  He reports new-onset sore throat but denies any difficulty swallowing. He is a current, everyday smoker- currently smoking 1ppd.  He is able to lay flat to sleep. He reports that four years ago he weighed 267 pounds, yesterday he weighed 239 pounds, but today he weighs 247 pounds. He reports he did not take his furosemide  yesterday. He denies any bowel or bladder disturbances, and denies abdominal pain, nausea or vomiting. He denies any new musculoskeletal or joint aches or pains. A complete review of systems is obtained and is otherwise negative.    PHYSICAL EXAM:  Wt Readings from Last 3 Encounters:  07/11/17 246 lb 12.8 oz (111.9 kg)  06/29/17 239 lb 11.2 oz (108.7 kg)  06/26/17 242 lb (109.8 kg)   Temp Readings from Last 3 Encounters:  07/11/17 98 F (36.7 C) (Oral)  06/29/17 98.3 F (36.8 C) (Oral)  06/26/17 (!) 97.3 F (36.3 C)   BP Readings from Last 3 Encounters:  07/11/17 (!) 196/75  06/29/17 (!) 200/70  06/26/17 (!) 145/62   Pulse Readings from Last 3 Encounters:  07/11/17 (!) 58  06/29/17 83  06/26/17 (!) 55   Pain Assessment Pain Score: 0-No pain/10  In general this is a well appearing Caucasian male in no acute distress. He is alert and oriented x4 and appropriate throughout the examination. HEENT reveals that the patient is normocephalic, atraumatic. EOMs are intact. PERRLA. Skin is intact without any evidence of gross lesions. Cardiovascular exam reveals a regular rate and rhythm, no clicks rubs or murmurs are auscultated. Chest is clear to auscultation bilaterally. Lymphatic assessment is performed and does not reveal any adenopathy in the cervical, supraclavicular, axillary, or inguinal chains. Abdomen has active bowel sounds in all quadrants and is intact. The abdomen is soft, non tender, non distended. Lower extremities are negative for pretibial pitting edema, deep calf tenderness, cyanosis or clubbing.   KPS = 90  100 - Normal; no complaints; no evidence of  disease. 90   - Able to carry on normal activity; minor signs or symptoms of disease. 80   - Normal activity with effort; some signs or symptoms of disease. 67   - Cares for self; unable to carry on normal activity or to do active work. 60   - Requires occasional assistance, but is able to care for most of his personal needs. 50   - Requires considerable assistance and frequent medical care. 35   - Disabled; requires special care and assistance. 72   - Severely disabled; hospital admission is indicated although death not imminent. 21   - Very sick; hospital admission necessary; active supportive treatment necessary. 10   - Moribund; fatal processes progressing rapidly. 0     - Dead  Karnofsky DA, Abelmann [place], Craver LS and Burchenal JH [phone]) The use of the nitrogen mustards in the palliative treatment of carcinoma: with particular reference to bronchogenic carcinoma Cancer 1 [id number]  LABORATORY DATA:  Lab Results  Component Value Date   WBC 5.8 06/29/2017   HGB 9.8 (L) 06/29/2017   HCT 31.9 (L) 06/29/2017   MCV 77.6 (L) 06/29/2017   PLT 244 06/29/2017   Lab Results  Component Value Date   NA 139 06/29/2017   K 3.7 06/29/2017   CL 104 06/26/2017   CO2 29 06/29/2017   Lab Results  Component Value Date   ALT 12 06/29/2017   AST 19 06/29/2017   ALKPHOS 134 06/29/2017   BILITOT 0.35 06/29/2017     RADIOGRAPHY: Dg Chest 2 View  Result Date: 06/26/2017 CLINICAL DATA:  Preoperative exam for left lung biopsy. EXAM: CHEST  2 VIEW COMPARISON:  05/25/2017. FINDINGS: Mediastinum hilar structures are stable. Stable mild cardiomegaly. No pulmonary venous congestion. Assistant density/infiltrate left upper lung. Tiny left pleural effusion cannot be excluded. No pneumothorax. Surgical clips left chest. IMPRESSION: Persistent density/infiltrate  left upper lung. Tiny left pleural effusion cannot be excluded. Electronically Signed   By: Marcello Moores  Register   On: 06/26/2017 07:01   Mr Brain W Wo  Contrast  Result Date: 06/23/2017 CLINICAL DATA:  Non intractable headache. Malignant neoplasm of the lung. Brain metastases. EXAM: MRI HEAD WITHOUT AND WITH CONTRAST TECHNIQUE: Multiplanar, multiecho pulse sequences of the brain and surrounding structures were obtained without and with intravenous contrast. CONTRAST:  61m MULTIHANCE GADOBENATE DIMEGLUMINE 529 MG/ML IV SOLN COMPARISON:  MRI brain 09/27/2015. FINDINGS: Brain: Remote infarcts of the cerebellum are noted. Remote blood products are present on the right. Remote infarcts are present within the thalamus bilaterally. Mild periventricular and subcortical white matter disease is progressive. No acute hemorrhage is present. White matter changes extend into the brainstem. The cerebellum is unremarkable. The internal auditory canals are normal bilaterally. The postcontrast images demonstrate no pathologic enhancement. Vascular: Normal flow voids. Skull and upper cervical spine: Flow is present in the major intracranial arteries. Sinuses/Orbits: The skullbase is within normal limits. The craniocervical junction is within normal limits. IMPRESSION: 1. No evidence for metastatic disease of the brain or meninges. 2. Remote infarcts of the basal ganglia and thalamus bilaterally. 3. No acute infarct. Electronically Signed   By: CSan MorelleM.D.   On: 06/23/2017 15:25   Dg C-arm Bronchoscopy  Result Date: 06/26/2017 C-ARM BRONCHOSCOPY: Fluoroscopy was utilized by the requesting physician.  No radiographic interpretation.      IMPRESSION/PLAN: 1. 61y.o.  Gentleman with newly diagnosed Stage III (TIbN1M0) NSCLC, squamous cell carcinoma of the left upper lobe with a suspicious left pre-vascular lymph node.  Today, we talked to the patient about the findings and work-up thus far.  We discussed the natural history of non-small cell lung cancer and general treatment, highlighting the role of radiotherapy in the management.  We discussed the available  radiation techniques, and focused on the details of logistics and delivery. The recommendation is for 6.5 weeks of daily radiotherapy, concurrent with chemotherapy.  We reviewed the anticipated acute and late sequelae associated with radiation in this setting.  The patient was encouraged to ask questions that we answered to the best of our ability.   At the conclusion of our discussion today, the patient elects to proceed with concurrent chemoradiation and is scheduled for CT simulation tomorrow 07/12/2017 at 8:00 AM. He freely signed written consent which was placed in his chart and was provided with a copy of the consent document as well.  He is tentatively scheduled to begin chemotherapy on 07/16/17 so we will aim to start radiation at that time as well.  We spent 25 minutes face to face with the patient and more than 50% of that time was spent in counseling and/or coordination of care.    ANicholos Johns PA-C    MTyler Pita MD  [facility] Direct Dial: [phone] Fax: [phone]  Skype  LinkedIn    Page Me   This document serves as a record of services personally performed by MTyler Pita MD and AFreeman Caldron PA-C. It was created on their behalf by HRae Lips a trained medical scribe. The creation of this record is based on the scribe's personal observations and the providers' statements to them. This document has been checked and approved by the attending providers.

## 2017-07-11 NOTE — Progress Notes (Signed)
See progress note under physician encounter. 

## 2017-07-12 ENCOUNTER — Encounter (HOSPITAL_COMMUNITY): Payer: Self-pay

## 2017-07-12 ENCOUNTER — Ambulatory Visit
Admission: RE | Admit: 2017-07-12 | Discharge: 2017-07-12 | Disposition: A | Discharge status: Home / Self Care | Payer: Medicare Other | Source: Ambulatory Visit | Attending: Radiation Oncology | Admitting: Radiation Oncology

## 2017-07-12 ENCOUNTER — Ambulatory Visit: Payer: Medicare Other

## 2017-07-12 DIAGNOSIS — C3492 Malignant neoplasm of unspecified part of left bronchus or lung: Secondary | ICD-10-CM

## 2017-07-12 DIAGNOSIS — I6529 Occlusion and stenosis of unspecified carotid artery: Secondary | ICD-10-CM | POA: Diagnosis not present

## 2017-07-12 DIAGNOSIS — F17210 Nicotine dependence, cigarettes, uncomplicated: Secondary | ICD-10-CM | POA: Diagnosis not present

## 2017-07-12 DIAGNOSIS — Z510 Encounter for antineoplastic radiation therapy: Secondary | ICD-10-CM | POA: Diagnosis not present

## 2017-07-12 DIAGNOSIS — M129 Arthropathy, unspecified: Secondary | ICD-10-CM | POA: Diagnosis not present

## 2017-07-12 DIAGNOSIS — C3412 Malignant neoplasm of upper lobe, left bronchus or lung: Secondary | ICD-10-CM | POA: Diagnosis not present

## 2017-07-12 DIAGNOSIS — D649 Anemia, unspecified: Secondary | ICD-10-CM | POA: Diagnosis not present

## 2017-07-12 NOTE — Progress Notes (Signed)
  Radiation Oncology         [phone] ________________________________  Name: Brad [name] MRN: [id number]  Date: 07/12/2017  DOB: [birth year]  SIMULATION AND TREATMENT PLANNING NOTE    ICD-10-CM   1. Stage III squamous cell carcinoma of left lung (H[facility]) C34.92     DIAGNOSIS:  Stage III (TIbN1M0) NSCLC, squamous cell carcinoma of the left upper lobe with left pre-vascular lymph node involvement  NARRATIVE:  The patient was brought to the [facility].  Identity was confirmed.  All relevant records and images related to the planned course of therapy were reviewed.  The patient freely provided informed written consent to proceed with treatment after reviewing the details related to the planned course of therapy. The consent form was witnessed and verified by the simulation staff.  Then, the patient was set-up in a stable reproducible  supine position for radiation therapy.  CT images were obtained.  Surface markings were placed.  The CT images were loaded into the planning software.  Then the target and avoidance structures were contoured.  Treatment planning then occurred.  The radiation prescription was entered and confirmed.  Then, I designed and supervised the construction of a total of 6 medically necessary complex treatment devices, including a BodyFix immobilization mold custom fitted to the patient along with 5 multileaf collimators conformally shaped radiation around the treatment target while shielding critical structures such as the heart and spinal cord maximally.  I have requested : 3D Simulation  I have requested a DVH of the following structures: Left lung, right lung, spinal cord, heart, esophagus, and target.  I have ordered:Nutrition Consult  SPECIAL TREATMENT PROCEDURE:  The planned course of therapy using radiation constitutes a special treatment procedure. Special care is required in the management of this patient for the following reasons.  The patient will  be receiving concurrent chemotherapy requiring careful monitoring for increased toxicities of treatment including periodic laboratory values.  The special nature of the planned course of radiotherapy will require increased physician supervision and oversight to ensure patient's safety with optimal treatment outcomes.  PLAN:  The patient will receive 66 Gy in 33 fractions.  ________________________________  Sheral Apley Tammi Klippel, M.D.  This document serves as a record of services personally performed by Tyler Pita, MD. It was created on his behalf by Linward Natal, a trained medical scribe. The creation of this record is based on the scribe's personal observations and the provider's statements to them. This document has been checked and approved by the attending provider.

## 2017-07-13 ENCOUNTER — Ambulatory Visit: Payer: Medicare Other

## 2017-07-13 DIAGNOSIS — Z510 Encounter for antineoplastic radiation therapy: Secondary | ICD-10-CM | POA: Diagnosis not present

## 2017-07-13 DIAGNOSIS — I6529 Occlusion and stenosis of unspecified carotid artery: Secondary | ICD-10-CM | POA: Diagnosis not present

## 2017-07-13 DIAGNOSIS — C3412 Malignant neoplasm of upper lobe, left bronchus or lung: Secondary | ICD-10-CM | POA: Diagnosis not present

## 2017-07-13 DIAGNOSIS — F17210 Nicotine dependence, cigarettes, uncomplicated: Secondary | ICD-10-CM | POA: Diagnosis not present

## 2017-07-13 DIAGNOSIS — M129 Arthropathy, unspecified: Secondary | ICD-10-CM | POA: Diagnosis not present

## 2017-07-13 DIAGNOSIS — D649 Anemia, unspecified: Secondary | ICD-10-CM | POA: Diagnosis not present

## 2017-07-16 ENCOUNTER — Ambulatory Visit: Payer: Medicare Other | Admitting: Internal Medicine

## 2017-07-16 ENCOUNTER — Ambulatory Visit: Payer: Medicare Other

## 2017-07-16 ENCOUNTER — Other Ambulatory Visit: Payer: Medicare Other

## 2017-07-17 ENCOUNTER — Ambulatory Visit: Payer: Medicare Other

## 2017-07-18 ENCOUNTER — Ambulatory Visit
Admission: RE | Admit: 2017-07-18 | Discharge: 2017-07-18 | Disposition: A | Discharge status: Home / Self Care | Payer: Medicare Other | Source: Ambulatory Visit | Attending: Radiation Oncology | Admitting: Radiation Oncology

## 2017-07-18 ENCOUNTER — Other Ambulatory Visit: Payer: Self-pay | Admitting: Internal Medicine

## 2017-07-18 ENCOUNTER — Ambulatory Visit: Payer: Medicare Other

## 2017-07-18 DIAGNOSIS — M129 Arthropathy, unspecified: Secondary | ICD-10-CM | POA: Diagnosis not present

## 2017-07-18 DIAGNOSIS — C3412 Malignant neoplasm of upper lobe, left bronchus or lung: Secondary | ICD-10-CM | POA: Diagnosis not present

## 2017-07-18 DIAGNOSIS — D649 Anemia, unspecified: Secondary | ICD-10-CM | POA: Diagnosis not present

## 2017-07-18 DIAGNOSIS — F17210 Nicotine dependence, cigarettes, uncomplicated: Secondary | ICD-10-CM | POA: Diagnosis not present

## 2017-07-18 DIAGNOSIS — Z510 Encounter for antineoplastic radiation therapy: Secondary | ICD-10-CM | POA: Diagnosis not present

## 2017-07-18 DIAGNOSIS — I6529 Occlusion and stenosis of unspecified carotid artery: Secondary | ICD-10-CM | POA: Diagnosis not present

## 2017-07-19 ENCOUNTER — Ambulatory Visit
Admission: RE | Admit: 2017-07-19 | Discharge: 2017-07-19 | Disposition: A | Discharge status: Home / Self Care | Payer: Medicare Other | Source: Ambulatory Visit | Attending: Radiation Oncology | Admitting: Radiation Oncology

## 2017-07-19 ENCOUNTER — Ambulatory Visit: Payer: Medicare Other

## 2017-07-19 DIAGNOSIS — M129 Arthropathy, unspecified: Secondary | ICD-10-CM | POA: Diagnosis not present

## 2017-07-19 DIAGNOSIS — Z510 Encounter for antineoplastic radiation therapy: Secondary | ICD-10-CM | POA: Diagnosis not present

## 2017-07-19 DIAGNOSIS — D649 Anemia, unspecified: Secondary | ICD-10-CM | POA: Diagnosis not present

## 2017-07-19 DIAGNOSIS — C3412 Malignant neoplasm of upper lobe, left bronchus or lung: Secondary | ICD-10-CM | POA: Diagnosis not present

## 2017-07-19 DIAGNOSIS — F17210 Nicotine dependence, cigarettes, uncomplicated: Secondary | ICD-10-CM | POA: Diagnosis not present

## 2017-07-19 DIAGNOSIS — I6529 Occlusion and stenosis of unspecified carotid artery: Secondary | ICD-10-CM | POA: Diagnosis not present

## 2017-07-20 ENCOUNTER — Ambulatory Visit: Payer: Medicare Other

## 2017-07-20 ENCOUNTER — Ambulatory Visit
Admission: RE | Admit: 2017-07-20 | Discharge: 2017-07-20 | Disposition: A | Discharge status: Home / Self Care | Payer: Medicare Other | Source: Ambulatory Visit | Attending: Radiation Oncology | Admitting: Radiation Oncology

## 2017-07-20 DIAGNOSIS — F17210 Nicotine dependence, cigarettes, uncomplicated: Secondary | ICD-10-CM | POA: Diagnosis not present

## 2017-07-20 DIAGNOSIS — D649 Anemia, unspecified: Secondary | ICD-10-CM | POA: Diagnosis not present

## 2017-07-20 DIAGNOSIS — C3412 Malignant neoplasm of upper lobe, left bronchus or lung: Secondary | ICD-10-CM | POA: Diagnosis not present

## 2017-07-20 DIAGNOSIS — M129 Arthropathy, unspecified: Secondary | ICD-10-CM | POA: Diagnosis not present

## 2017-07-20 DIAGNOSIS — Z510 Encounter for antineoplastic radiation therapy: Secondary | ICD-10-CM | POA: Diagnosis not present

## 2017-07-20 DIAGNOSIS — I6529 Occlusion and stenosis of unspecified carotid artery: Secondary | ICD-10-CM | POA: Diagnosis not present

## 2017-07-23 ENCOUNTER — Ambulatory Visit
Admission: RE | Admit: 2017-07-23 | Discharge: 2017-07-23 | Disposition: A | Discharge status: Home / Self Care | Payer: Medicare Other | Source: Ambulatory Visit | Attending: Radiation Oncology | Admitting: Radiation Oncology

## 2017-07-23 ENCOUNTER — Ambulatory Visit (HOSPITAL_BASED_OUTPATIENT_CLINIC_OR_DEPARTMENT_OTHER): Payer: Medicare Other

## 2017-07-23 ENCOUNTER — Other Ambulatory Visit (HOSPITAL_BASED_OUTPATIENT_CLINIC_OR_DEPARTMENT_OTHER): Payer: Medicare Other

## 2017-07-23 ENCOUNTER — Ambulatory Visit: Payer: Medicare Other

## 2017-07-23 VITALS — BP 179/81 | HR 57 | Temp 97.5°F | Resp 16

## 2017-07-23 DIAGNOSIS — I6529 Occlusion and stenosis of unspecified carotid artery: Secondary | ICD-10-CM | POA: Diagnosis not present

## 2017-07-23 DIAGNOSIS — Z5111 Encounter for antineoplastic chemotherapy: Secondary | ICD-10-CM | POA: Diagnosis present

## 2017-07-23 DIAGNOSIS — C3412 Malignant neoplasm of upper lobe, left bronchus or lung: Secondary | ICD-10-CM

## 2017-07-23 DIAGNOSIS — F17210 Nicotine dependence, cigarettes, uncomplicated: Secondary | ICD-10-CM | POA: Diagnosis not present

## 2017-07-23 DIAGNOSIS — D649 Anemia, unspecified: Secondary | ICD-10-CM | POA: Diagnosis not present

## 2017-07-23 DIAGNOSIS — C3492 Malignant neoplasm of unspecified part of left bronchus or lung: Secondary | ICD-10-CM

## 2017-07-23 DIAGNOSIS — M129 Arthropathy, unspecified: Secondary | ICD-10-CM | POA: Diagnosis not present

## 2017-07-23 DIAGNOSIS — Z510 Encounter for antineoplastic radiation therapy: Secondary | ICD-10-CM | POA: Diagnosis not present

## 2017-07-23 LAB — CBC WITH DIFFERENTIAL/PLATELET
BASO%: 1.2 % (ref 0.0–2.0)
Basophils Absolute: 0.1 10*3/uL (ref 0.0–0.1)
EOS%: 2.9 % (ref 0.0–7.0)
Eosinophils Absolute: 0.2 10*3/uL (ref 0.0–0.5)
HCT: 28.9 % — ABNORMAL LOW (ref 38.4–49.9)
HGB: 9.3 g/dL — ABNORMAL LOW (ref 13.0–17.1)
LYMPH%: 14.3 % (ref 14.0–49.0)
MCH: 23.1 pg — ABNORMAL LOW (ref 27.2–33.4)
MCHC: 32 g/dL (ref 32.0–36.0)
MCV: 72.1 fL — ABNORMAL LOW (ref 79.3–98.0)
MONO#: 0.5 10*3/uL (ref 0.1–0.9)
MONO%: 8.1 % (ref 0.0–14.0)
NEUT#: 4.6 10*3/uL (ref 1.5–6.5)
NEUT%: 73.5 % (ref 39.0–75.0)
Platelets: 292 10*3/uL (ref 140–400)
RBC: 4.01 10*6/uL — ABNORMAL LOW (ref 4.20–5.82)
RDW: 17.3 % — ABNORMAL HIGH (ref 11.0–14.6)
WBC: 6.2 10*3/uL (ref 4.0–10.3)
lymph#: 0.9 10*3/uL (ref 0.9–3.3)

## 2017-07-23 LAB — COMPREHENSIVE METABOLIC PANEL
ALT: 7 U/L (ref 0–55)
AST: 14 U/L (ref 5–34)
Albumin: 2.4 g/dL — ABNORMAL LOW (ref 3.5–5.0)
Alkaline Phosphatase: 159 U/L — ABNORMAL HIGH (ref 40–150)
Anion Gap: 9 mEq/L (ref 3–11)
BUN: 26.5 mg/dL — ABNORMAL HIGH (ref 7.0–26.0)
CO2: 25 mEq/L (ref 22–29)
Calcium: 8.8 mg/dL (ref 8.4–10.4)
Chloride: 102 mEq/L (ref 98–109)
Creatinine: 1 mg/dL (ref 0.7–1.3)
EGFR: >60 mL/min/{1.73_m2} (ref >60)
Glucose: 91 mg/dl (ref 70–140)
Potassium: 4.2 mEq/L (ref 3.5–5.1)
Sodium: 136 mEq/L (ref 136–145)
Total Bilirubin: 0.28 mg/dL (ref 0.20–1.20)
Total Protein: 7 g/dL (ref 6.4–8.3)

## 2017-07-23 MED ORDER — CLONIDINE HCL 0.1 MG PO TABS
ORAL_TABLET | ORAL | Status: AC
  Filled 2017-07-23: qty 2

## 2017-07-23 MED ORDER — FAMOTIDINE IN NACL 20-0.9 MG/50ML-% IV SOLN
INTRAVENOUS | Status: AC
  Filled 2017-07-23: qty 50

## 2017-07-23 MED ORDER — SODIUM CHLORIDE 0.9 % IV SOLN
20.0000 mg | Freq: Once | INTRAVENOUS | Status: AC
  Administered 2017-07-23: 20 mg via INTRAVENOUS
  Filled 2017-07-23: qty 2

## 2017-07-23 MED ORDER — FAMOTIDINE IN NACL 20-0.9 MG/50ML-% IV SOLN
20.0000 mg | Freq: Once | INTRAVENOUS | Status: AC
Start: 2017-07-23 — End: 2017-07-23
  Administered 2017-07-23: 20 mg via INTRAVENOUS

## 2017-07-23 MED ORDER — DIPHENHYDRAMINE HCL 50 MG/ML IJ SOLN
INTRAMUSCULAR | Status: AC
  Filled 2017-07-23: qty 1

## 2017-07-23 MED ORDER — CLONIDINE HCL 0.1 MG PO TABS
0.2000 mg | ORAL_TABLET | Freq: Once | ORAL | Status: AC
  Administered 2017-07-23: 0.2 mg via ORAL

## 2017-07-23 MED ORDER — DIPHENHYDRAMINE HCL 50 MG/ML IJ SOLN
50.0000 mg | Freq: Once | INTRAMUSCULAR | Status: AC
  Administered 2017-07-23: 50 mg via INTRAVENOUS

## 2017-07-23 MED ORDER — SODIUM CHLORIDE 0.9 % IV SOLN
Freq: Once | INTRAVENOUS | Status: AC
  Administered 2017-07-23: 12:00:00 via INTRAVENOUS

## 2017-07-23 MED ORDER — PALONOSETRON HCL INJECTION 0.25 MG/5ML
0.2500 mg | Freq: Once | INTRAVENOUS | Status: AC
  Administered 2017-07-23: 0.25 mg via INTRAVENOUS

## 2017-07-23 MED ORDER — SODIUM CHLORIDE 0.9 % IV SOLN
240.0000 mg | Freq: Once | INTRAVENOUS | Status: AC
  Administered 2017-07-23: 240 mg via INTRAVENOUS
  Filled 2017-07-23: qty 24

## 2017-07-23 MED ORDER — PACLITAXEL CHEMO INJECTION 300 MG/50ML
45.0000 mg/m2 | Freq: Once | INTRAVENOUS | Status: AC
  Administered 2017-07-23: 102 mg via INTRAVENOUS
  Filled 2017-07-23: qty 17

## 2017-07-23 MED ORDER — PALONOSETRON HCL INJECTION 0.25 MG/5ML
INTRAVENOUS | Status: AC
  Filled 2017-07-23: qty 5

## 2017-07-23 NOTE — Patient Instructions (Signed)
[facility] Discharge Instructions for Patients Receiving Chemotherapy  Today you received the following chemotherapy agents:  Taxol and Carboplatin.  To help prevent nausea and vomiting after your treatment, we encourage you to take your nausea medication as directed.   If you develop nausea and vomiting that is not controlled by your nausea medication, call the clinic.   BELOW ARE SYMPTOMS THAT SHOULD BE REPORTED IMMEDIATELY:  *FEVER GREATER THAN 100.5 F  *CHILLS WITH OR WITHOUT FEVER  NAUSEA AND VOMITING THAT IS NOT CONTROLLED WITH YOUR NAUSEA MEDICATION  *UNUSUAL SHORTNESS OF BREATH  *UNUSUAL BRUISING OR BLEEDING  TENDERNESS IN MOUTH AND THROAT WITH OR WITHOUT PRESENCE OF ULCERS  *URINARY PROBLEMS  *BOWEL PROBLEMS  UNUSUAL RASH Items with * indicate a potential emergency and should be followed up as soon as possible.  Feel free to call the clinic should you have any questions or concerns. The clinic phone number is [phone].  Please show the [facility] at check-in to the Emergency Department and triage nurse.  Paclitaxel injection What is this medicine? PACLITAXEL (PAK li TAX el) is a chemotherapy drug. It targets fast dividing cells, like cancer cells, and causes these cells to die. This medicine is used to treat ovarian cancer, breast cancer, and other cancers. This medicine may be used for other purposes; ask your health care provider or pharmacist if you have questions. COMMON BRAND NAME(S): Onxol, Taxol What should I tell my health care provider before I take this medicine? They need to know if you have any of these conditions: -blood disorders -irregular heartbeat -infection (especially a virus infection such as chickenpox, cold sores, or herpes) -liver disease -previous or ongoing radiation therapy -an unusual or allergic reaction to paclitaxel, alcohol, polyoxyethylated castor oil, other chemotherapy agents, other medicines, foods,  dyes, or preservatives -pregnant or trying to get pregnant -breast-feeding How should I use this medicine? This drug is given as an infusion into a vein. It is administered in a hospital or clinic by a specially trained health care professional. Talk to your pediatrician regarding the use of this medicine in children. Special care may be needed. Overdosage: If you think you have taken too much of this medicine contact a poison control center or emergency room at once. NOTE: This medicine is only for you. Do not share this medicine with others. What if I miss a dose? It is important not to miss your dose. Call your doctor or health care professional if you are unable to keep an appointment. What may interact with this medicine? Do not take this medicine with any of the following medications: -disulfiram -metronidazole This medicine may also interact with the following medications: -cyclosporine -diazepam -ketoconazole -medicines to increase blood counts like filgrastim, pegfilgrastim, sargramostim -other chemotherapy drugs like cisplatin, doxorubicin, epirubicin, etoposide, teniposide, vincristine -quinidine -testosterone -vaccines -verapamil Talk to your doctor or health care professional before taking any of these medicines: -acetaminophen -aspirin -ibuprofen -ketoprofen -naproxen This list may not describe all possible interactions. Give your health care provider a list of all the medicines, herbs, non-prescription drugs, or dietary supplements you use. Also tell them if you smoke, drink alcohol, or use illegal drugs. Some items may interact with your medicine. What should I watch for while using this medicine? Your condition will be monitored carefully while you are receiving this medicine. You will need important blood work done while you are taking this medicine. This medicine can cause serious allergic reactions. To reduce your risk you will  need to take other medicine(s)  before treatment with this medicine. If you experience allergic reactions like skin rash, itching or hives, swelling of the face, lips, or tongue, tell your doctor or health care professional right away. In some cases, you may be given additional medicines to help with side effects. Follow all directions for their use. This drug may make you feel generally unwell. This is not uncommon, as chemotherapy can affect healthy cells as well as cancer cells. Report any side effects. Continue your course of treatment even though you feel ill unless your doctor tells you to stop. Call your doctor or health care professional for advice if you get a fever, chills or sore throat, or other symptoms of a cold or flu. Do not treat yourself. This drug decreases your body's ability to fight infections. Try to avoid being around people who are sick. This medicine may increase your risk to bruise or bleed. Call your doctor or health care professional if you notice any unusual bleeding. Be careful brushing and flossing your teeth or using a toothpick because you may get an infection or bleed more easily. If you have any dental work done, tell your dentist you are receiving this medicine. Avoid taking products that contain aspirin, acetaminophen, ibuprofen, naproxen, or ketoprofen unless instructed by your doctor. These medicines may hide a fever. Do not become pregnant while taking this medicine. Women should inform their doctor if they wish to become pregnant or think they might be pregnant. There is a potential for serious side effects to an unborn child. Talk to your health care professional or pharmacist for more information. Do not breast-feed an infant while taking this medicine. Men are advised not to father a child while receiving this medicine. This product may contain alcohol. Ask your pharmacist or healthcare provider if this medicine contains alcohol. Be sure to tell all healthcare providers you are taking this  medicine. Certain medicines, like metronidazole and disulfiram, can cause an unpleasant reaction when taken with alcohol. The reaction includes flushing, headache, nausea, vomiting, sweating, and increased thirst. The reaction can last from 30 minutes to several hours. What side effects may I notice from receiving this medicine? Side effects that you should report to your doctor or health care professional as soon as possible: -allergic reactions like skin rash, itching or hives, swelling of the face, lips, or tongue -low blood counts - This drug may decrease the number of white blood cells, red blood cells and platelets. You may be at increased risk for infections and bleeding. -signs of infection - fever or chills, cough, sore throat, pain or difficulty passing urine -signs of decreased platelets or bleeding - bruising, pinpoint red spots on the skin, black, tarry stools, nosebleeds -signs of decreased red blood cells - unusually weak or tired, fainting spells, lightheadedness -breathing problems -chest pain -high or low blood pressure -mouth sores -nausea and vomiting -pain, swelling, redness or irritation at the injection site -pain, tingling, numbness in the hands or feet -slow or irregular heartbeat -swelling of the ankle, feet, hands Side effects that usually do not require medical attention (report to your doctor or health care professional if they continue or are bothersome): -bone pain -complete hair loss including hair on your head, underarms, pubic hair, eyebrows, and eyelashes -changes in the color of fingernails -diarrhea -loosening of the fingernails -loss of appetite -muscle or joint pain -red flush to skin -sweating This list may not describe all possible side effects. Call your doctor for medical  advice about side effects. You may report side effects to FDA at 1-800-FDA-1088. Where should I keep my medicine? This drug is given in a hospital or clinic and will not be  stored at home. NOTE: This sheet is a summary. It may not cover all possible information. If you have questions about this medicine, talk to your doctor, pharmacist, or health care provider.  2018 [employer] Standard (2015-05-25 19:58:00)  Carboplatin injection What is this medicine? CARBOPLATIN (KAR boe pla tin) is a chemotherapy drug. It targets fast dividing cells, like cancer cells, and causes these cells to die. This medicine is used to treat ovarian cancer and many other cancers. This medicine may be used for other purposes; ask your health care provider or pharmacist if you have questions. COMMON BRAND NAME(S): Paraplatin What should I tell my health care provider before I take this medicine? They need to know if you have any of these conditions: -blood disorders -hearing problems -kidney disease -recent or ongoing radiation therapy -an unusual or allergic reaction to carboplatin, cisplatin, other chemotherapy, other medicines, foods, dyes, or preservatives -pregnant or trying to get pregnant -breast-feeding How should I use this medicine? This drug is usually given as an infusion into a vein. It is administered in a hospital or clinic by a specially trained health care professional. Talk to your pediatrician regarding the use of this medicine in children. Special care may be needed. Overdosage: If you think you have taken too much of this medicine contact a poison control center or emergency room at once. NOTE: This medicine is only for you. Do not share this medicine with others. What if I miss a dose? It is important not to miss a dose. Call your doctor or health care professional if you are unable to keep an appointment. What may interact with this medicine? -medicines for seizures -medicines to increase blood counts like filgrastim, pegfilgrastim, sargramostim -some antibiotics like amikacin, gentamicin, neomycin, streptomycin, tobramycin -vaccines Talk to your doctor or  health care professional before taking any of these medicines: -acetaminophen -aspirin -ibuprofen -ketoprofen -naproxen This list may not describe all possible interactions. Give your health care provider a list of all the medicines, herbs, non-prescription drugs, or dietary supplements you use. Also tell them if you smoke, drink alcohol, or use illegal drugs. Some items may interact with your medicine. What should I watch for while using this medicine? Your condition will be monitored carefully while you are receiving this medicine. You will need important blood work done while you are taking this medicine. This drug may make you feel generally unwell. This is not uncommon, as chemotherapy can affect healthy cells as well as cancer cells. Report any side effects. Continue your course of treatment even though you feel ill unless your doctor tells you to stop. In some cases, you may be given additional medicines to help with side effects. Follow all directions for their use. Call your doctor or health care professional for advice if you get a fever, chills or sore throat, or other symptoms of a cold or flu. Do not treat yourself. This drug decreases your body's ability to fight infections. Try to avoid being around people who are sick. This medicine may increase your risk to bruise or bleed. Call your doctor or health care professional if you notice any unusual bleeding. Be careful brushing and flossing your teeth or using a toothpick because you may get an infection or bleed more easily. If you have any dental work done, tell your  dentist you are receiving this medicine. Avoid taking products that contain aspirin, acetaminophen, ibuprofen, naproxen, or ketoprofen unless instructed by your doctor. These medicines may hide a fever. Do not become pregnant while taking this medicine. Women should inform their doctor if they wish to become pregnant or think they might be pregnant. There is a potential for  serious side effects to an unborn child. Talk to your health care professional or pharmacist for more information. Do not breast-feed an infant while taking this medicine. What side effects may I notice from receiving this medicine? Side effects that you should report to your doctor or health care professional as soon as possible: -allergic reactions like skin rash, itching or hives, swelling of the face, lips, or tongue -signs of infection - fever or chills, cough, sore throat, pain or difficulty passing urine -signs of decreased platelets or bleeding - bruising, pinpoint red spots on the skin, black, tarry stools, nosebleeds -signs of decreased red blood cells - unusually weak or tired, fainting spells, lightheadedness -breathing problems -changes in hearing -changes in vision -chest pain -high blood pressure -low blood counts - This drug may decrease the number of white blood cells, red blood cells and platelets. You may be at increased risk for infections and bleeding. -nausea and vomiting -pain, swelling, redness or irritation at the injection site -pain, tingling, numbness in the hands or feet -problems with balance, talking, walking -trouble passing urine or change in the amount of urine Side effects that usually do not require medical attention (report to your doctor or health care professional if they continue or are bothersome): -hair loss -loss of appetite -metallic taste in the mouth or changes in taste This list may not describe all possible side effects. Call your doctor for medical advice about side effects. You may report side effects to FDA at 1-800-FDA-1088. Where should I keep my medicine? This drug is given in a hospital or clinic and will not be stored at home. NOTE: This sheet is a summary. It may not cover all possible information. If you have questions about this medicine, talk to your doctor, pharmacist, or health care provider.  2018 [employer] Standard  (2007-10-29 14:38:05)

## 2017-07-24 ENCOUNTER — Ambulatory Visit: Payer: Medicare Other

## 2017-07-24 ENCOUNTER — Ambulatory Visit
Admission: RE | Admit: 2017-07-24 | Discharge: 2017-07-24 | Disposition: A | Discharge status: Home / Self Care | Payer: Medicare Other | Source: Ambulatory Visit | Attending: Radiation Oncology | Admitting: Radiation Oncology

## 2017-07-24 DIAGNOSIS — D649 Anemia, unspecified: Secondary | ICD-10-CM | POA: Diagnosis not present

## 2017-07-24 DIAGNOSIS — F17210 Nicotine dependence, cigarettes, uncomplicated: Secondary | ICD-10-CM | POA: Diagnosis not present

## 2017-07-24 DIAGNOSIS — M129 Arthropathy, unspecified: Secondary | ICD-10-CM | POA: Diagnosis not present

## 2017-07-24 DIAGNOSIS — I6529 Occlusion and stenosis of unspecified carotid artery: Secondary | ICD-10-CM | POA: Diagnosis not present

## 2017-07-24 DIAGNOSIS — Z510 Encounter for antineoplastic radiation therapy: Secondary | ICD-10-CM | POA: Diagnosis not present

## 2017-07-24 DIAGNOSIS — C3412 Malignant neoplasm of upper lobe, left bronchus or lung: Secondary | ICD-10-CM | POA: Diagnosis not present

## 2017-07-25 ENCOUNTER — Ambulatory Visit: Payer: Medicare Other

## 2017-07-26 ENCOUNTER — Ambulatory Visit
Admission: RE | Admit: 2017-07-26 | Discharge: 2017-07-26 | Disposition: A | Discharge status: Home / Self Care | Payer: Medicare Other | Source: Ambulatory Visit | Attending: Radiation Oncology | Admitting: Radiation Oncology

## 2017-07-26 ENCOUNTER — Telehealth: Payer: Self-pay | Admitting: Medical Oncology

## 2017-07-26 ENCOUNTER — Ambulatory Visit: Payer: Medicare Other

## 2017-07-26 DIAGNOSIS — M129 Arthropathy, unspecified: Secondary | ICD-10-CM | POA: Diagnosis not present

## 2017-07-26 DIAGNOSIS — D649 Anemia, unspecified: Secondary | ICD-10-CM | POA: Diagnosis not present

## 2017-07-26 DIAGNOSIS — C3412 Malignant neoplasm of upper lobe, left bronchus or lung: Secondary | ICD-10-CM | POA: Diagnosis not present

## 2017-07-26 DIAGNOSIS — F17210 Nicotine dependence, cigarettes, uncomplicated: Secondary | ICD-10-CM | POA: Diagnosis not present

## 2017-07-26 DIAGNOSIS — Z510 Encounter for antineoplastic radiation therapy: Secondary | ICD-10-CM | POA: Diagnosis not present

## 2017-07-26 DIAGNOSIS — I6529 Occlusion and stenosis of unspecified carotid artery: Secondary | ICD-10-CM | POA: Diagnosis not present

## 2017-07-26 NOTE — Telephone Encounter (Signed)
Pt is doing fine eating and drinking."I did not have any problems"

## 2017-07-27 ENCOUNTER — Ambulatory Visit: Payer: Medicare Other | Admitting: Oncology

## 2017-07-27 ENCOUNTER — Ambulatory Visit: Payer: Medicare Other

## 2017-07-27 ENCOUNTER — Telehealth: Payer: Self-pay | Admitting: Medical Oncology

## 2017-07-27 NOTE — Telephone Encounter (Signed)
Pt has car trouble , cannot make appt today.

## 2017-07-30 ENCOUNTER — Ambulatory Visit: Payer: Medicare Other

## 2017-07-30 ENCOUNTER — Other Ambulatory Visit: Payer: Medicare Other

## 2017-08-01 ENCOUNTER — Ambulatory Visit: Payer: Medicare Other

## 2017-08-01 ENCOUNTER — Telehealth: Payer: Self-pay | Admitting: Radiation Oncology

## 2017-08-01 NOTE — Telephone Encounter (Signed)
Received word from therapist, Alm Bustard, that the patient hasn't been treated since last Thursday (07/26/17) and cancelled for this entire week. To date patient has received 6 of 33 intended tx to his LUL lung and lymph nodes. Phoned patient to inquire. Patient reports he was caring for his ex brother in law following his knee replacement. The patient reports he "must have picked up pneumonia while at the hospital with him." Patient denies being evaluated by his PCP reference his symptoms. Patient states, "I have sweat it out and don't need to see anyone." Explained radiation is less therapeutic with large gaps in treatment. Patient confirmed his intent to return for treatment on Monday, December 31.

## 2017-08-02 ENCOUNTER — Ambulatory Visit: Payer: Medicare Other

## 2017-08-03 ENCOUNTER — Ambulatory Visit: Payer: Medicare Other

## 2017-08-06 ENCOUNTER — Ambulatory Visit: Payer: Medicare Other

## 2017-08-06 ENCOUNTER — Ambulatory Visit (HOSPITAL_BASED_OUTPATIENT_CLINIC_OR_DEPARTMENT_OTHER): Payer: Medicare Other

## 2017-08-06 ENCOUNTER — Ambulatory Visit
Admission: RE | Admit: 2017-08-06 | Discharge: 2017-08-06 | Disposition: A | Discharge status: Home / Self Care | Payer: Medicare Other | Source: Ambulatory Visit | Attending: Radiation Oncology | Admitting: Radiation Oncology

## 2017-08-06 ENCOUNTER — Other Ambulatory Visit (HOSPITAL_BASED_OUTPATIENT_CLINIC_OR_DEPARTMENT_OTHER): Payer: Medicare Other

## 2017-08-06 VITALS — BP 168/75 | HR 67 | Temp 98.2°F | Resp 19

## 2017-08-06 DIAGNOSIS — F17210 Nicotine dependence, cigarettes, uncomplicated: Secondary | ICD-10-CM | POA: Diagnosis not present

## 2017-08-06 DIAGNOSIS — C3412 Malignant neoplasm of upper lobe, left bronchus or lung: Secondary | ICD-10-CM

## 2017-08-06 DIAGNOSIS — Z510 Encounter for antineoplastic radiation therapy: Secondary | ICD-10-CM | POA: Diagnosis not present

## 2017-08-06 DIAGNOSIS — M129 Arthropathy, unspecified: Secondary | ICD-10-CM | POA: Diagnosis not present

## 2017-08-06 DIAGNOSIS — I6529 Occlusion and stenosis of unspecified carotid artery: Secondary | ICD-10-CM | POA: Diagnosis not present

## 2017-08-06 DIAGNOSIS — C3492 Malignant neoplasm of unspecified part of left bronchus or lung: Secondary | ICD-10-CM

## 2017-08-06 DIAGNOSIS — Z5111 Encounter for antineoplastic chemotherapy: Secondary | ICD-10-CM

## 2017-08-06 DIAGNOSIS — D649 Anemia, unspecified: Secondary | ICD-10-CM | POA: Diagnosis not present

## 2017-08-06 LAB — CBC WITH DIFFERENTIAL/PLATELET
BASO%: 1.7 % (ref 0.0–2.0)
Basophils Absolute: 0.1 10*3/uL (ref 0.0–0.1)
EOS%: 1 % (ref 0.0–7.0)
Eosinophils Absolute: 0 10*3/uL (ref 0.0–0.5)
HCT: 32.1 % — ABNORMAL LOW (ref 38.4–49.9)
HGB: 10.3 g/dL — ABNORMAL LOW (ref 13.0–17.1)
LYMPH%: 15.5 % (ref 14.0–49.0)
MCH: 22.8 pg — ABNORMAL LOW (ref 27.2–33.4)
MCHC: 32.1 g/dL (ref 32.0–36.0)
MCV: 71.2 fL — ABNORMAL LOW (ref 79.3–98.0)
MONO#: 0.6 10*3/uL (ref 0.1–0.9)
MONO%: 13.5 % (ref 0.0–14.0)
NEUT#: 3.2 10*3/uL (ref 1.5–6.5)
NEUT%: 68.3 % (ref 39.0–75.0)
Platelets: 272 10*3/uL (ref 140–400)
RBC: 4.51 10*6/uL (ref 4.20–5.82)
RDW: 17.3 % — ABNORMAL HIGH (ref 11.0–14.6)
WBC: 4.8 10*3/uL (ref 4.0–10.3)
lymph#: 0.7 10*3/uL — ABNORMAL LOW (ref 0.9–3.3)

## 2017-08-06 LAB — COMPREHENSIVE METABOLIC PANEL WITH GFR
ALT: 9 U/L (ref 0–55)
AST: 15 U/L (ref 5–34)
Albumin: 2.2 g/dL — ABNORMAL LOW (ref 3.5–5.0)
Alkaline Phosphatase: 139 U/L (ref 40–150)
Anion Gap: 7 meq/L (ref 3–11)
BUN: 16 mg/dL (ref 7.0–26.0)
CO2: 33 meq/L — ABNORMAL HIGH (ref 22–29)
Calcium: 9.1 mg/dL (ref 8.4–10.4)
Chloride: 98 meq/L (ref 98–109)
Creatinine: 1.3 mg/dL (ref 0.7–1.3)
EGFR: 58 ml/min/1.73 m2 — ABNORMAL LOW
Glucose: 110 mg/dL (ref 70–140)
Potassium: 3.6 meq/L (ref 3.5–5.1)
Sodium: 138 meq/L (ref 136–145)
Total Bilirubin: 0.35 mg/dL (ref 0.20–1.20)
Total Protein: 7.1 g/dL (ref 6.4–8.3)

## 2017-08-06 MED ORDER — DIPHENHYDRAMINE HCL 50 MG/ML IJ SOLN
50.0000 mg | Freq: Once | INTRAMUSCULAR | Status: AC
  Administered 2017-08-06: 50 mg via INTRAVENOUS

## 2017-08-06 MED ORDER — FAMOTIDINE IN NACL 20-0.9 MG/50ML-% IV SOLN
INTRAVENOUS | Status: AC
Start: 2017-08-06 — End: ?
  Filled 2017-08-06: qty 50

## 2017-08-06 MED ORDER — DIPHENHYDRAMINE HCL 50 MG/ML IJ SOLN
INTRAMUSCULAR | Status: AC
  Filled 2017-08-06: qty 1

## 2017-08-06 MED ORDER — SODIUM CHLORIDE 0.9 % IV SOLN
20.0000 mg | Freq: Once | INTRAVENOUS | Status: AC
  Administered 2017-08-06: 20 mg via INTRAVENOUS
  Filled 2017-08-06: qty 2

## 2017-08-06 MED ORDER — SODIUM CHLORIDE 0.9 % IV SOLN
Freq: Once | INTRAVENOUS | Status: AC
  Administered 2017-08-06: 10:00:00 via INTRAVENOUS

## 2017-08-06 MED ORDER — PALONOSETRON HCL INJECTION 0.25 MG/5ML
0.2500 mg | Freq: Once | INTRAVENOUS | Status: AC
  Administered 2017-08-06: 0.25 mg via INTRAVENOUS

## 2017-08-06 MED ORDER — FAMOTIDINE 20 MG PO TABS
ORAL_TABLET | ORAL | Status: AC
  Filled 2017-08-06: qty 1

## 2017-08-06 MED ORDER — PALONOSETRON HCL INJECTION 0.25 MG/5ML
INTRAVENOUS | Status: AC
  Filled 2017-08-06: qty 5

## 2017-08-06 MED ORDER — SODIUM CHLORIDE 0.9 % IV SOLN
238.2000 mg | Freq: Once | INTRAVENOUS | Status: AC
  Administered 2017-08-06: 240 mg via INTRAVENOUS
  Filled 2017-08-06: qty 24

## 2017-08-06 MED ORDER — PACLITAXEL CHEMO INJECTION 300 MG/50ML
45.0000 mg/m2 | Freq: Once | INTRAVENOUS | Status: AC
  Administered 2017-08-06: 102 mg via INTRAVENOUS
  Filled 2017-08-06: qty 17

## 2017-08-06 MED ORDER — FAMOTIDINE IN NACL 20-0.9 MG/50ML-% IV SOLN
20.0000 mg | Freq: Once | INTRAVENOUS | Status: AC
  Administered 2017-08-06: 20 mg via INTRAVENOUS

## 2017-08-06 NOTE — Patient Instructions (Signed)
[facility] Discharge Instructions for Patients Receiving Chemotherapy  Today you received the following chemotherapy agents:  Taxol and Carboplatin.  To help prevent nausea and vomiting after your treatment, we encourage you to take your nausea medication as directed.   If you develop nausea and vomiting that is not controlled by your nausea medication, call the clinic.   BELOW ARE SYMPTOMS THAT SHOULD BE REPORTED IMMEDIATELY:  *FEVER GREATER THAN 100.5 F  *CHILLS WITH OR WITHOUT FEVER  NAUSEA AND VOMITING THAT IS NOT CONTROLLED WITH YOUR NAUSEA MEDICATION  *UNUSUAL SHORTNESS OF BREATH  *UNUSUAL BRUISING OR BLEEDING  TENDERNESS IN MOUTH AND THROAT WITH OR WITHOUT PRESENCE OF ULCERS  *URINARY PROBLEMS  *BOWEL PROBLEMS  UNUSUAL RASH Items with * indicate a potential emergency and should be followed up as soon as possible.  Feel free to call the clinic should you have any questions or concerns. The clinic phone number is [phone].  Please show the [place] at check-in to the Emergency Department and triage nurse.  Paclitaxel injection What is this medicine? PACLITAXEL (PAK li TAX el) is a chemotherapy drug. It targets fast dividing cells, like cancer cells, and causes these cells to die. This medicine is used to treat ovarian cancer, breast cancer, and other cancers. This medicine may be used for other purposes; ask your health care provider or pharmacist if you have questions. COMMON BRAND NAME(S): Onxol, Taxol What should I tell my health care provider before I take this medicine? They need to know if you have any of these conditions: -blood disorders -irregular heartbeat -infection (especially a virus infection such as chickenpox, cold sores, or herpes) -liver disease -previous or ongoing radiation therapy -an unusual or allergic reaction to paclitaxel, alcohol, polyoxyethylated castor oil, other chemotherapy agents, other medicines, foods,  dyes, or preservatives -pregnant or trying to get pregnant -breast-feeding How should I use this medicine? This drug is given as an infusion into a vein. It is administered in a hospital or clinic by a specially trained health care professional. Talk to your pediatrician regarding the use of this medicine in children. Special care may be needed. Overdosage: If you think you have taken too much of this medicine contact a poison control center or emergency room at once. NOTE: This medicine is only for you. Do not share this medicine with others. What if I miss a dose? It is important not to miss your dose. Call your doctor or health care professional if you are unable to keep an appointment. What may interact with this medicine? Do not take this medicine with any of the following medications: -disulfiram -metronidazole This medicine may also interact with the following medications: -cyclosporine -diazepam -ketoconazole -medicines to increase blood counts like filgrastim, pegfilgrastim, sargramostim -other chemotherapy drugs like cisplatin, doxorubicin, epirubicin, etoposide, teniposide, vincristine -quinidine -testosterone -vaccines -verapamil Talk to your doctor or health care professional before taking any of these medicines: -acetaminophen -aspirin -ibuprofen -ketoprofen -naproxen This list may not describe all possible interactions. Give your health care provider a list of all the medicines, herbs, non-prescription drugs, or dietary supplements you use. Also tell them if you smoke, drink alcohol, or use illegal drugs. Some items may interact with your medicine. What should I watch for while using this medicine? Your condition will be monitored carefully while you are receiving this medicine. You will need important blood work done while you are taking this medicine. This medicine can cause serious allergic reactions. To reduce your risk you will  need to take other medicine(s)  before treatment with this medicine. If you experience allergic reactions like skin rash, itching or hives, swelling of the face, lips, or tongue, tell your doctor or health care professional right away. In some cases, you may be given additional medicines to help with side effects. Follow all directions for their use. This drug may make you feel generally unwell. This is not uncommon, as chemotherapy can affect healthy cells as well as cancer cells. Report any side effects. Continue your course of treatment even though you feel ill unless your doctor tells you to stop. Call your doctor or health care professional for advice if you get a fever, chills or sore throat, or other symptoms of a cold or flu. Do not treat yourself. This drug decreases your body's ability to fight infections. Try to avoid being around people who are sick. This medicine may increase your risk to bruise or bleed. Call your doctor or health care professional if you notice any unusual bleeding. Be careful brushing and flossing your teeth or using a toothpick because you may get an infection or bleed more easily. If you have any dental work done, tell your dentist you are receiving this medicine. Avoid taking products that contain aspirin, acetaminophen, ibuprofen, naproxen, or ketoprofen unless instructed by your doctor. These medicines may hide a fever. Do not become pregnant while taking this medicine. Women should inform their doctor if they wish to become pregnant or think they might be pregnant. There is a potential for serious side effects to an unborn child. Talk to your health care professional or pharmacist for more information. Do not breast-feed an infant while taking this medicine. Men are advised not to father a child while receiving this medicine. This product may contain alcohol. Ask your pharmacist or healthcare provider if this medicine contains alcohol. Be sure to tell all healthcare providers you are taking this  medicine. Certain medicines, like metronidazole and disulfiram, can cause an unpleasant reaction when taken with alcohol. The reaction includes flushing, headache, nausea, vomiting, sweating, and increased thirst. The reaction can last from 30 minutes to several hours. What side effects may I notice from receiving this medicine? Side effects that you should report to your doctor or health care professional as soon as possible: -allergic reactions like skin rash, itching or hives, swelling of the face, lips, or tongue -low blood counts - This drug may decrease the number of white blood cells, red blood cells and platelets. You may be at increased risk for infections and bleeding. -signs of infection - fever or chills, cough, sore throat, pain or difficulty passing urine -signs of decreased platelets or bleeding - bruising, pinpoint red spots on the skin, black, tarry stools, nosebleeds -signs of decreased red blood cells - unusually weak or tired, fainting spells, lightheadedness -breathing problems -chest pain -high or low blood pressure -mouth sores -nausea and vomiting -pain, swelling, redness or irritation at the injection site -pain, tingling, numbness in the hands or feet -slow or irregular heartbeat -swelling of the ankle, feet, hands Side effects that usually do not require medical attention (report to your doctor or health care professional if they continue or are bothersome): -bone pain -complete hair loss including hair on your head, underarms, pubic hair, eyebrows, and eyelashes -changes in the color of fingernails -diarrhea -loosening of the fingernails -loss of appetite -muscle or joint pain -red flush to skin -sweating This list may not describe all possible side effects. Call your doctor for medical  advice about side effects. You may report side effects to FDA at 1-800-FDA-1088. Where should I keep my medicine? This drug is given in a hospital or clinic and will not be  stored at home. NOTE: This sheet is a summary. It may not cover all possible information. If you have questions about this medicine, talk to your doctor, pharmacist, or health care provider.  2018 [employer] Standard (2015-05-25 19:58:00)  Carboplatin injection What is this medicine? CARBOPLATIN (KAR boe pla tin) is a chemotherapy drug. It targets fast dividing cells, like cancer cells, and causes these cells to die. This medicine is used to treat ovarian cancer and many other cancers. This medicine may be used for other purposes; ask your health care provider or pharmacist if you have questions. COMMON BRAND NAME(S): Paraplatin What should I tell my health care provider before I take this medicine? They need to know if you have any of these conditions: -blood disorders -hearing problems -kidney disease -recent or ongoing radiation therapy -an unusual or allergic reaction to carboplatin, cisplatin, other chemotherapy, other medicines, foods, dyes, or preservatives -pregnant or trying to get pregnant -breast-feeding How should I use this medicine? This drug is usually given as an infusion into a vein. It is administered in a hospital or clinic by a specially trained health care professional. Talk to your pediatrician regarding the use of this medicine in children. Special care may be needed. Overdosage: If you think you have taken too much of this medicine contact a poison control center or emergency room at once. NOTE: This medicine is only for you. Do not share this medicine with others. What if I miss a dose? It is important not to miss a dose. Call your doctor or health care professional if you are unable to keep an appointment. What may interact with this medicine? -medicines for seizures -medicines to increase blood counts like filgrastim, pegfilgrastim, sargramostim -some antibiotics like amikacin, gentamicin, neomycin, streptomycin, tobramycin -vaccines Talk to your doctor or  health care professional before taking any of these medicines: -acetaminophen -aspirin -ibuprofen -ketoprofen -naproxen This list may not describe all possible interactions. Give your health care provider a list of all the medicines, herbs, non-prescription drugs, or dietary supplements you use. Also tell them if you smoke, drink alcohol, or use illegal drugs. Some items may interact with your medicine. What should I watch for while using this medicine? Your condition will be monitored carefully while you are receiving this medicine. You will need important blood work done while you are taking this medicine. This drug may make you feel generally unwell. This is not uncommon, as chemotherapy can affect healthy cells as well as cancer cells. Report any side effects. Continue your course of treatment even though you feel ill unless your doctor tells you to stop. In some cases, you may be given additional medicines to help with side effects. Follow all directions for their use. Call your doctor or health care professional for advice if you get a fever, chills or sore throat, or other symptoms of a cold or flu. Do not treat yourself. This drug decreases your body's ability to fight infections. Try to avoid being around people who are sick. This medicine may increase your risk to bruise or bleed. Call your doctor or health care professional if you notice any unusual bleeding. Be careful brushing and flossing your teeth or using a toothpick because you may get an infection or bleed more easily. If you have any dental work done, tell your  dentist you are receiving this medicine. Avoid taking products that contain aspirin, acetaminophen, ibuprofen, naproxen, or ketoprofen unless instructed by your doctor. These medicines may hide a fever. Do not become pregnant while taking this medicine. Women should inform their doctor if they wish to become pregnant or think they might be pregnant. There is a potential for  serious side effects to an unborn child. Talk to your health care professional or pharmacist for more information. Do not breast-feed an infant while taking this medicine. What side effects may I notice from receiving this medicine? Side effects that you should report to your doctor or health care professional as soon as possible: -allergic reactions like skin rash, itching or hives, swelling of the face, lips, or tongue -signs of infection - fever or chills, cough, sore throat, pain or difficulty passing urine -signs of decreased platelets or bleeding - bruising, pinpoint red spots on the skin, black, tarry stools, nosebleeds -signs of decreased red blood cells - unusually weak or tired, fainting spells, lightheadedness -breathing problems -changes in hearing -changes in vision -chest pain -high blood pressure -low blood counts - This drug may decrease the number of white blood cells, red blood cells and platelets. You may be at increased risk for infections and bleeding. -nausea and vomiting -pain, swelling, redness or irritation at the injection site -pain, tingling, numbness in the hands or feet -problems with balance, talking, walking -trouble passing urine or change in the amount of urine Side effects that usually do not require medical attention (report to your doctor or health care professional if they continue or are bothersome): -hair loss -loss of appetite -metallic taste in the mouth or changes in taste This list may not describe all possible side effects. Call your doctor for medical advice about side effects. You may report side effects to FDA at 1-800-FDA-1088. Where should I keep my medicine? This drug is given in a hospital or clinic and will not be stored at home. NOTE: This sheet is a summary. It may not cover all possible information. If you have questions about this medicine, talk to your doctor, pharmacist, or health care provider.  2018 [employer]/Gold Standard  (2007-10-29 14:38:05)

## 2017-08-07 DIAGNOSIS — C3412 Malignant neoplasm of upper lobe, left bronchus or lung: Secondary | ICD-10-CM | POA: Diagnosis not present

## 2017-08-07 DIAGNOSIS — R42 Dizziness and giddiness: Secondary | ICD-10-CM | POA: Diagnosis not present

## 2017-08-07 DIAGNOSIS — J449 Chronic obstructive pulmonary disease, unspecified: Secondary | ICD-10-CM | POA: Diagnosis not present

## 2017-08-07 DIAGNOSIS — G4700 Insomnia, unspecified: Secondary | ICD-10-CM | POA: Diagnosis not present

## 2017-08-07 DIAGNOSIS — Z9221 Personal history of antineoplastic chemotherapy: Secondary | ICD-10-CM | POA: Diagnosis not present

## 2017-08-07 DIAGNOSIS — L281 Prurigo nodularis: Secondary | ICD-10-CM | POA: Diagnosis not present

## 2017-08-07 DIAGNOSIS — R3915 Urgency of urination: Secondary | ICD-10-CM | POA: Diagnosis not present

## 2017-08-07 DIAGNOSIS — Z510 Encounter for antineoplastic radiation therapy: Secondary | ICD-10-CM | POA: Diagnosis not present

## 2017-08-07 DIAGNOSIS — M2550 Pain in unspecified joint: Secondary | ICD-10-CM | POA: Diagnosis not present

## 2017-08-07 DIAGNOSIS — B182 Chronic viral hepatitis C: Secondary | ICD-10-CM | POA: Diagnosis not present

## 2017-08-07 DIAGNOSIS — Z8719 Personal history of other diseases of the digestive system: Secondary | ICD-10-CM | POA: Diagnosis not present

## 2017-08-07 DIAGNOSIS — K219 Gastro-esophageal reflux disease without esophagitis: Secondary | ICD-10-CM | POA: Diagnosis not present

## 2017-08-07 DIAGNOSIS — I341 Nonrheumatic mitral (valve) prolapse: Secondary | ICD-10-CM | POA: Diagnosis not present

## 2017-08-07 DIAGNOSIS — E039 Hypothyroidism, unspecified: Secondary | ICD-10-CM | POA: Diagnosis not present

## 2017-08-07 DIAGNOSIS — E119 Type 2 diabetes mellitus without complications: Secondary | ICD-10-CM | POA: Diagnosis not present

## 2017-08-07 DIAGNOSIS — M129 Arthropathy, unspecified: Secondary | ICD-10-CM | POA: Diagnosis not present

## 2017-08-07 DIAGNOSIS — D649 Anemia, unspecified: Secondary | ICD-10-CM | POA: Diagnosis not present

## 2017-08-07 DIAGNOSIS — I6529 Occlusion and stenosis of unspecified carotid artery: Secondary | ICD-10-CM | POA: Diagnosis not present

## 2017-08-07 DIAGNOSIS — F17210 Nicotine dependence, cigarettes, uncomplicated: Secondary | ICD-10-CM | POA: Diagnosis not present

## 2017-08-07 DIAGNOSIS — G629 Polyneuropathy, unspecified: Secondary | ICD-10-CM | POA: Diagnosis not present

## 2017-08-07 DIAGNOSIS — F329 Major depressive disorder, single episode, unspecified: Secondary | ICD-10-CM | POA: Diagnosis not present

## 2017-08-07 DIAGNOSIS — E785 Hyperlipidemia, unspecified: Secondary | ICD-10-CM | POA: Diagnosis not present

## 2017-08-07 DIAGNOSIS — Z7982 Long term (current) use of aspirin: Secondary | ICD-10-CM | POA: Diagnosis not present

## 2017-08-07 DIAGNOSIS — I10 Essential (primary) hypertension: Secondary | ICD-10-CM | POA: Diagnosis not present

## 2017-08-08 ENCOUNTER — Ambulatory Visit: Payer: Medicare Other

## 2017-08-08 ENCOUNTER — Ambulatory Visit
Admission: RE | Admit: 2017-08-08 | Discharge: 2017-08-08 | Disposition: A | Discharge status: Home / Self Care | Payer: Medicare Other | Source: Ambulatory Visit | Attending: Radiation Oncology | Admitting: Radiation Oncology

## 2017-08-08 DIAGNOSIS — I6529 Occlusion and stenosis of unspecified carotid artery: Secondary | ICD-10-CM | POA: Diagnosis not present

## 2017-08-08 DIAGNOSIS — F17210 Nicotine dependence, cigarettes, uncomplicated: Secondary | ICD-10-CM | POA: Diagnosis not present

## 2017-08-08 DIAGNOSIS — M129 Arthropathy, unspecified: Secondary | ICD-10-CM | POA: Diagnosis not present

## 2017-08-08 DIAGNOSIS — Z510 Encounter for antineoplastic radiation therapy: Secondary | ICD-10-CM | POA: Diagnosis not present

## 2017-08-08 DIAGNOSIS — C3412 Malignant neoplasm of upper lobe, left bronchus or lung: Secondary | ICD-10-CM | POA: Diagnosis not present

## 2017-08-08 DIAGNOSIS — D649 Anemia, unspecified: Secondary | ICD-10-CM | POA: Diagnosis not present

## 2017-08-09 ENCOUNTER — Ambulatory Visit: Payer: Medicare Other

## 2017-08-09 ENCOUNTER — Ambulatory Visit
Admission: RE | Admit: 2017-08-09 | Discharge: 2017-08-09 | Disposition: A | Discharge status: Home / Self Care | Payer: Medicare Other | Source: Ambulatory Visit | Attending: Radiation Oncology | Admitting: Radiation Oncology

## 2017-08-09 DIAGNOSIS — M129 Arthropathy, unspecified: Secondary | ICD-10-CM | POA: Diagnosis not present

## 2017-08-09 DIAGNOSIS — D649 Anemia, unspecified: Secondary | ICD-10-CM | POA: Diagnosis not present

## 2017-08-09 DIAGNOSIS — F17210 Nicotine dependence, cigarettes, uncomplicated: Secondary | ICD-10-CM | POA: Diagnosis not present

## 2017-08-09 DIAGNOSIS — Z510 Encounter for antineoplastic radiation therapy: Secondary | ICD-10-CM | POA: Diagnosis not present

## 2017-08-09 DIAGNOSIS — I6529 Occlusion and stenosis of unspecified carotid artery: Secondary | ICD-10-CM | POA: Diagnosis not present

## 2017-08-09 DIAGNOSIS — C3412 Malignant neoplasm of upper lobe, left bronchus or lung: Secondary | ICD-10-CM | POA: Diagnosis not present

## 2017-08-10 ENCOUNTER — Ambulatory Visit: Payer: Medicare Other

## 2017-08-13 ENCOUNTER — Ambulatory Visit: Payer: Medicare Other

## 2017-08-13 ENCOUNTER — Ambulatory Visit: Payer: Medicare Other | Admitting: Oncology

## 2017-08-13 ENCOUNTER — Ambulatory Visit
Admission: RE | Admit: 2017-08-13 | Discharge: 2017-08-13 | Disposition: A | Discharge status: Home / Self Care | Payer: Medicare Other | Source: Ambulatory Visit | Attending: Radiation Oncology | Admitting: Radiation Oncology

## 2017-08-13 ENCOUNTER — Telehealth: Payer: Self-pay

## 2017-08-13 ENCOUNTER — Telehealth: Payer: Self-pay | Admitting: *Deleted

## 2017-08-13 ENCOUNTER — Other Ambulatory Visit: Payer: Medicare Other

## 2017-08-13 DIAGNOSIS — C3412 Malignant neoplasm of upper lobe, left bronchus or lung: Secondary | ICD-10-CM | POA: Diagnosis not present

## 2017-08-13 DIAGNOSIS — F17210 Nicotine dependence, cigarettes, uncomplicated: Secondary | ICD-10-CM | POA: Diagnosis not present

## 2017-08-13 DIAGNOSIS — Z510 Encounter for antineoplastic radiation therapy: Secondary | ICD-10-CM | POA: Diagnosis not present

## 2017-08-13 DIAGNOSIS — D649 Anemia, unspecified: Secondary | ICD-10-CM | POA: Diagnosis not present

## 2017-08-13 DIAGNOSIS — I6529 Occlusion and stenosis of unspecified carotid artery: Secondary | ICD-10-CM | POA: Diagnosis not present

## 2017-08-13 DIAGNOSIS — M129 Arthropathy, unspecified: Secondary | ICD-10-CM | POA: Diagnosis not present

## 2017-08-13 NOTE — Telephone Encounter (Signed)
Message on VM from pt states " I'm  Having car problems and cant make it today."  Message to scheduling to r/s

## 2017-08-13 NOTE — Telephone Encounter (Signed)
Called only # in chart and brother of patient reported "he is not here, call back later" - patient has not shown up for MD appt here or infusion.

## 2017-08-14 ENCOUNTER — Ambulatory Visit: Payer: Medicare Other

## 2017-08-14 ENCOUNTER — Ambulatory Visit: Payer: Medicare Other | Admitting: Oncology

## 2017-08-14 ENCOUNTER — Ambulatory Visit
Admission: RE | Admit: 2017-08-14 | Discharge: 2017-08-14 | Disposition: A | Discharge status: Home / Self Care | Payer: Medicare Other | Source: Ambulatory Visit | Attending: Radiation Oncology | Admitting: Radiation Oncology

## 2017-08-14 DIAGNOSIS — M129 Arthropathy, unspecified: Secondary | ICD-10-CM | POA: Diagnosis not present

## 2017-08-14 DIAGNOSIS — D649 Anemia, unspecified: Secondary | ICD-10-CM | POA: Diagnosis not present

## 2017-08-14 DIAGNOSIS — I6529 Occlusion and stenosis of unspecified carotid artery: Secondary | ICD-10-CM | POA: Diagnosis not present

## 2017-08-14 DIAGNOSIS — C3412 Malignant neoplasm of upper lobe, left bronchus or lung: Secondary | ICD-10-CM | POA: Diagnosis not present

## 2017-08-14 DIAGNOSIS — Z510 Encounter for antineoplastic radiation therapy: Secondary | ICD-10-CM | POA: Diagnosis not present

## 2017-08-14 DIAGNOSIS — F17210 Nicotine dependence, cigarettes, uncomplicated: Secondary | ICD-10-CM | POA: Diagnosis not present

## 2017-08-15 ENCOUNTER — Ambulatory Visit
Admission: RE | Admit: 2017-08-15 | Discharge: 2017-08-15 | Disposition: A | Discharge status: Home / Self Care | Payer: Medicare Other | Source: Ambulatory Visit | Attending: Radiation Oncology | Admitting: Radiation Oncology

## 2017-08-15 ENCOUNTER — Ambulatory Visit: Payer: Medicare Other

## 2017-08-15 DIAGNOSIS — F17210 Nicotine dependence, cigarettes, uncomplicated: Secondary | ICD-10-CM | POA: Diagnosis not present

## 2017-08-15 DIAGNOSIS — M129 Arthropathy, unspecified: Secondary | ICD-10-CM | POA: Diagnosis not present

## 2017-08-15 DIAGNOSIS — I6529 Occlusion and stenosis of unspecified carotid artery: Secondary | ICD-10-CM | POA: Diagnosis not present

## 2017-08-15 DIAGNOSIS — Z510 Encounter for antineoplastic radiation therapy: Secondary | ICD-10-CM | POA: Diagnosis not present

## 2017-08-15 DIAGNOSIS — D649 Anemia, unspecified: Secondary | ICD-10-CM | POA: Diagnosis not present

## 2017-08-15 DIAGNOSIS — C3412 Malignant neoplasm of upper lobe, left bronchus or lung: Secondary | ICD-10-CM | POA: Diagnosis not present

## 2017-08-16 ENCOUNTER — Ambulatory Visit: Payer: Medicare Other

## 2017-08-17 ENCOUNTER — Ambulatory Visit: Payer: Medicare Other

## 2017-08-18 ENCOUNTER — Ambulatory Visit: Payer: Medicare Other

## 2017-08-20 ENCOUNTER — Other Ambulatory Visit: Payer: Medicare Other

## 2017-08-20 ENCOUNTER — Ambulatory Visit: Payer: Medicare Other

## 2017-08-20 ENCOUNTER — Ambulatory Visit: Payer: Medicare Other | Admitting: Oncology

## 2017-08-21 ENCOUNTER — Ambulatory Visit: Payer: Medicare Other

## 2017-08-22 ENCOUNTER — Ambulatory Visit: Payer: Medicare Other

## 2017-08-23 ENCOUNTER — Ambulatory Visit: Payer: Medicare Other

## 2017-08-24 ENCOUNTER — Ambulatory Visit: Payer: Medicare Other

## 2017-08-27 ENCOUNTER — Ambulatory Visit: Payer: Medicare Other

## 2017-08-27 ENCOUNTER — Encounter: Payer: Self-pay | Admitting: *Deleted

## 2017-08-27 ENCOUNTER — Inpatient Hospital Stay: Payer: Medicare Other | Attending: Internal Medicine

## 2017-08-27 ENCOUNTER — Inpatient Hospital Stay: Payer: Medicare Other

## 2017-08-27 ENCOUNTER — Ambulatory Visit
Admission: RE | Admit: 2017-08-27 | Discharge: 2017-08-27 | Disposition: A | Discharge status: Home / Self Care | Payer: Medicare Other | Source: Ambulatory Visit | Attending: Radiation Oncology | Admitting: Radiation Oncology

## 2017-08-27 ENCOUNTER — Inpatient Hospital Stay (HOSPITAL_BASED_OUTPATIENT_CLINIC_OR_DEPARTMENT_OTHER): Payer: Medicare Other | Admitting: Oncology

## 2017-08-27 VITALS — BP 180/72 | HR 70 | Temp 97.7°F | Resp 18

## 2017-08-27 DIAGNOSIS — C3412 Malignant neoplasm of upper lobe, left bronchus or lung: Secondary | ICD-10-CM | POA: Diagnosis not present

## 2017-08-27 DIAGNOSIS — I10 Essential (primary) hypertension: Secondary | ICD-10-CM

## 2017-08-27 DIAGNOSIS — C3492 Malignant neoplasm of unspecified part of left bronchus or lung: Secondary | ICD-10-CM

## 2017-08-27 DIAGNOSIS — I6529 Occlusion and stenosis of unspecified carotid artery: Secondary | ICD-10-CM | POA: Diagnosis not present

## 2017-08-27 DIAGNOSIS — Z5111 Encounter for antineoplastic chemotherapy: Secondary | ICD-10-CM | POA: Insufficient documentation

## 2017-08-27 DIAGNOSIS — C3411 Malignant neoplasm of upper lobe, right bronchus or lung: Secondary | ICD-10-CM | POA: Diagnosis not present

## 2017-08-27 DIAGNOSIS — E119 Type 2 diabetes mellitus without complications: Secondary | ICD-10-CM

## 2017-08-27 DIAGNOSIS — F17210 Nicotine dependence, cigarettes, uncomplicated: Secondary | ICD-10-CM | POA: Diagnosis not present

## 2017-08-27 DIAGNOSIS — I169 Hypertensive crisis, unspecified: Secondary | ICD-10-CM | POA: Insufficient documentation

## 2017-08-27 DIAGNOSIS — Z510 Encounter for antineoplastic radiation therapy: Secondary | ICD-10-CM | POA: Diagnosis not present

## 2017-08-27 DIAGNOSIS — D649 Anemia, unspecified: Secondary | ICD-10-CM | POA: Diagnosis not present

## 2017-08-27 DIAGNOSIS — M129 Arthropathy, unspecified: Secondary | ICD-10-CM | POA: Diagnosis not present

## 2017-08-27 LAB — CBC WITH DIFFERENTIAL/PLATELET
Basophils Absolute: 0 10*3/uL (ref 0.0–0.1)
Basophils Relative: 1 %
Eosinophils Absolute: 0.1 10*3/uL (ref 0.0–0.5)
Eosinophils Relative: 3 %
HCT: 35 % — ABNORMAL LOW (ref 38.4–49.9)
Hemoglobin: 10.9 g/dL — ABNORMAL LOW (ref 13.0–17.1)
Lymphocytes Relative: 18 %
Lymphs Abs: 0.8 10*3/uL — ABNORMAL LOW (ref 0.9–3.3)
MCH: 23.5 pg — ABNORMAL LOW (ref 27.2–33.4)
MCHC: 31.1 g/dL — ABNORMAL LOW (ref 32.0–36.0)
MCV: 75.6 fL — ABNORMAL LOW (ref 79.3–98.0)
Monocytes Absolute: 0.5 10*3/uL (ref 0.1–0.9)
Monocytes Relative: 10 %
Neutro Abs: 3.1 10*3/uL (ref 1.5–6.5)
Neutrophils Relative %: 68 %
Platelets: 129 10*3/uL — ABNORMAL LOW (ref 140–400)
RBC: 4.63 MIL/uL (ref 4.20–5.82)
RDW: 18.4 % — ABNORMAL HIGH (ref 11.0–15.6)
WBC: 4.5 10*3/uL (ref 4.0–10.3)

## 2017-08-27 LAB — COMPREHENSIVE METABOLIC PANEL
ALT: 13 U/L (ref 0–55)
AST: 21 U/L (ref 5–34)
Albumin: 2.4 g/dL — ABNORMAL LOW (ref 3.5–5.0)
Alkaline Phosphatase: 142 U/L (ref 40–150)
Anion gap: 6 (ref 3–11)
BUN: 19 mg/dL (ref 7–26)
CO2: 25 mmol/L (ref 22–29)
Calcium: 9.1 mg/dL (ref 8.4–10.4)
Chloride: 106 mmol/L (ref 98–109)
Creatinine, Ser: 1.14 mg/dL (ref 0.70–1.30)
GFR calc Af Amer: >60 mL/min (ref >60)
GFR calc non Af Amer: >60 mL/min (ref >60)
Glucose, Bld: 102 mg/dL (ref 70–140)
Potassium: 4.6 mmol/L (ref 3.5–5.1)
Sodium: 137 mmol/L (ref 136–145)
Total Bilirubin: 0.2 mg/dL (ref 0.2–1.2)
Total Protein: 6.8 g/dL (ref 6.4–8.3)

## 2017-08-27 MED ORDER — PALONOSETRON HCL INJECTION 0.25 MG/5ML
INTRAVENOUS | Status: AC
  Filled 2017-08-27: qty 5

## 2017-08-27 MED ORDER — DIPHENHYDRAMINE HCL 50 MG/ML IJ SOLN
50.0000 mg | Freq: Once | INTRAMUSCULAR | Status: AC
  Administered 2017-08-27: 50 mg via INTRAVENOUS

## 2017-08-27 MED ORDER — SODIUM CHLORIDE 0.9 % IV SOLN
240.0000 mg | Freq: Once | INTRAVENOUS | Status: AC
  Administered 2017-08-27: 240 mg via INTRAVENOUS
  Filled 2017-08-27: qty 24

## 2017-08-27 MED ORDER — FAMOTIDINE IN NACL 20-0.9 MG/50ML-% IV SOLN
INTRAVENOUS | Status: AC
  Filled 2017-08-27: qty 50

## 2017-08-27 MED ORDER — SODIUM CHLORIDE 0.9 % IV SOLN
45.0000 mg/m2 | Freq: Once | INTRAVENOUS | Status: AC
  Administered 2017-08-27: 102 mg via INTRAVENOUS
  Filled 2017-08-27: qty 17

## 2017-08-27 MED ORDER — PALONOSETRON HCL INJECTION 0.25 MG/5ML
0.2500 mg | Freq: Once | INTRAVENOUS | Status: AC
  Administered 2017-08-27: 0.25 mg via INTRAVENOUS

## 2017-08-27 MED ORDER — DIPHENHYDRAMINE HCL 50 MG/ML IJ SOLN
INTRAMUSCULAR | Status: AC
  Filled 2017-08-27: qty 1

## 2017-08-27 MED ORDER — DEXAMETHASONE SODIUM PHOSPHATE 100 MG/10ML IJ SOLN
20.0000 mg | Freq: Once | INTRAMUSCULAR | Status: AC
  Administered 2017-08-27: 20 mg via INTRAVENOUS
  Filled 2017-08-27: qty 2

## 2017-08-27 MED ORDER — FAMOTIDINE IN NACL 20-0.9 MG/50ML-% IV SOLN
20.0000 mg | Freq: Once | INTRAVENOUS | Status: AC
  Administered 2017-08-27: 20 mg via INTRAVENOUS

## 2017-08-27 MED ORDER — SODIUM CHLORIDE 0.9 % IV SOLN
Freq: Once | INTRAVENOUS | Status: AC
  Administered 2017-08-27: 14:00:00 via INTRAVENOUS

## 2017-08-27 NOTE — Patient Instructions (Signed)
[facility] Discharge Instructions for Patients Receiving Chemotherapy  Today you received the following chemotherapy agents:  Carboplatin, Taxol  To help prevent nausea and vomiting after your treatment, we encourage you to take your nausea medication as prescribed.   If you develop nausea and vomiting that is not controlled by your nausea medication, call the clinic.   BELOW ARE SYMPTOMS THAT SHOULD BE REPORTED IMMEDIATELY:  *FEVER GREATER THAN 100.5 F  *CHILLS WITH OR WITHOUT FEVER  NAUSEA AND VOMITING THAT IS NOT CONTROLLED WITH YOUR NAUSEA MEDICATION  *UNUSUAL SHORTNESS OF BREATH  *UNUSUAL BRUISING OR BLEEDING  TENDERNESS IN MOUTH AND THROAT WITH OR WITHOUT PRESENCE OF ULCERS  *URINARY PROBLEMS  *BOWEL PROBLEMS  UNUSUAL RASH Items with * indicate a potential emergency and should be followed up as soon as possible.  Feel free to call the clinic should you have any questions or concerns. The clinic phone number is [phone].  Please show the [place] at check-in to the Emergency Department and triage nurse.

## 2017-08-27 NOTE — Progress Notes (Signed)
[facility] OFFICE PROGRESS NOTE  Biagio Borg, MD [place] [address]  DIAGNOSIS: questionable stage IA (T1b, N0, M0) versus IIIA (T1b, N2, M0) non-small cell lung cancer, squamous cell carcinoma presented with left upper lobe lung nodule as well as suspicious left prevascular lymph node diagnosed in November 2018  PRIOR THERAPY: None  CURRENT THERAPY: Concurrent chemoradiation with weekly carboplatin for an AUC of 2 and paclitaxel 45 mg/m.  First dose given 07/23/2017.  Status post 2 cycles.  INTERVAL HISTORY: Brad [name] 62 y.o. male returns for routine follow-up visit.  He is seen in the infusion area.  The patient has missed several weeks of his treatment due to not showing up for his appointments.  Review of the records also indicate that he has missed some of his radiation treatments as well.  The patient reports that he is tolerating treatment fairly well.  He denies fevers and chills.  Denies chest pain, shortness breath, cough, hemoptysis.  Denies nausea, vomiting, constipation, diarrhea.  He denies any recent weight loss or night sweats.  The patient is here for evaluation prior to cycle 3 of his chemotherapy.  MEDICAL HISTORY: Past Medical History:  Diagnosis Date  . Anemia    takes Ferrous Sulfated daily  . Arthritis   . Carotid stenosis 08/27/2014   takes Aspirin daily  . Chronic hepatitis C ([place]) 09/17/2015  . COPD (chronic obstructive pulmonary disease) (HCC)    Albuterol daily as needed and Spiriva daily  . Depression   . Diabetes mellitus type II    takes Metformin and Glipizide daily  . Dizziness   . GERD (gastroesophageal reflux disease)   . GI bleed 12/2015  . Headache    occasionally  . History of blood clots    in right leg;being followed by Dr.Brabham for this  . History of blood transfusion 09/2014   2 units-no abnormal reaction noted  . History of gastric ulcer   . History of hiatal hernia   . HTN  (hypertension) 09/14/2015  . Hyperlipidemia 08/27/2014   takes Simvastatin daily  . Hypertension    takes Amlodipine and Lisinopril daily  . Hypothyroidism 08/27/2014   takes Synthroid daily  . Insomnia    takes Pamelor nightly  . Joint pain    hips  . Muscle spasm    takes Zanaflex daily as needed  . MVP (mitral valve prolapse)   . Peripheral edema    takes Furosemide daily as needed  . Peripheral neuropathy   . Prurigo nodularis   . Shortness of breath dyspnea   . Stroke ([place])   . Urinary frequency   . Urinary urgency     ALLERGIES:  is allergic to doxycycline; lipitor [atorvastatin]; and oxycodone.  MEDICATIONS:  Current Outpatient Medications  Medication Sig Dispense Refill  . amLODipine (NORVASC) 5 MG tablet Take 1 tablet (5 mg total) daily by mouth. (Patient not taking: Reported on 07/11/2017) 90 tablet 3  . aspirin EC 81 MG tablet Take 1 tablet (81 mg total) by mouth daily. 90 tablet 11  . ATROVENT HFA 17 MCG/ACT inhaler INHALE 2 PUFFS INTO THE LUNGS FOUR TIMES DAILY (Patient taking differently: INHALE 2 PUFFS INTO THE LUNGS 2-3 TIMES DAILY) 38.7 g 0  . Blood Pressure Monitor DEVI Use to check Blood pressure daily 1 Device 0  . ferrous sulfate 325 (65 FE) MG tablet Take 1 tablet (325 mg total) by mouth 3 (three) times daily with meals. (  Patient taking differently: Take 325 mg 2 (two) times daily by mouth. Once to twice daily) 30 tablet 3  . furosemide (LASIX) 80 MG tablet Take 1 tablet (80 mg total) daily by mouth. 90 tablet 3  . gabapentin (NEURONTIN) 600 MG tablet TAKE 1 TABLET(600 MG) BY MOUTH THREE TIMES DAILY (Patient taking differently: Take 600 mg 3 (three) times daily as needed by mouth (for nerve pain/neuropathy.). TAKE 1 TABLET(600 MG) BY MOUTH THREE TIMES DAILY) 270 tablet 1  . glucose blood (ACCU-CHEK AVIVA PLUS) test strip Use to check blood sugars daily Dx E11.9 (Patient not taking: Reported on 07/11/2017) 100 each 3  . Lancets MISC 1 each by Other route See admin  instructions. Reported on 09/07/2015    . levothyroxine (SYNTHROID, LEVOTHROID) 112 MCG tablet Take 1 tablet (112 mcg total) daily by mouth. (Patient not taking: Reported on 06/29/2017) 90 tablet 3  . lisinopril (PRINIVIL,ZESTRIL) 20 MG tablet Take 1 tablet (20 mg total) daily by mouth. 90 tablet 3  . metolazone (ZAROXOLYN) 5 MG tablet Take 1 tablet (5 mg total) daily as needed by mouth. 90 tablet 3  . potassium chloride (K-DUR) 10 MEQ tablet TAKE 2 TABLETS BY MOUTH EVERY DAY IN THE MORNING 180 tablet 3  . prochlorperazine (COMPAZINE) 10 MG tablet TAKE 1 TABLET(10 MG) BY MOUTH EVERY 6 HOURS AS NEEDED FOR NAUSEA OR VOMITING (Patient not taking: Reported on 07/11/2017) 385 tablet 0  . Respiratory Therapy Supplies (NEBULIZER) DEVI Use as directed four times daily 1 each 0  . tiZANidine (ZANAFLEX) 4 MG tablet Take 1 tablet (4 mg total) every 6 (six) hours as needed by mouth for muscle spasms. 60 tablet 2  . umeclidinium-vilanterol (ANORO ELLIPTA) 62.5-25 MCG/INH AEPB Inhale 1 puff into the lungs daily. 1 each 3   No current facility-administered medications for this visit.    Facility-Administered Medications Ordered in Other Visits  Medication Dose Route Frequency Provider Last Rate Last Dose  . CARBOplatin (PARAPLATIN) 240 mg in sodium chloride 0.9 % 250 mL chemo infusion  240 mg Intravenous Once Curt Bears, MD        SURGICAL HISTORY:  Past Surgical History:  Procedure Laterality Date  . ENDARTERECTOMY Left 11/13/2014   Procedure: ENDARTERECTOMY CAROTID WITH PATCH ANGIOPLASTY;  Surgeon: Serafina Mitchell, MD;  Location: [facility] OR;  Service: Vascular;  Laterality: Left;  . ESOPHAGOGASTRODUODENOSCOPY N/A 09/24/2014   Procedure: ESOPHAGOGASTRODUODENOSCOPY (EGD);  Surgeon: Winfield Cunas., MD;  Location: [facility] ENDOSCOPY;  Service: Endoscopy;  Laterality: N/A;  . FUDUCIAL PLACEMENT N/A 06/26/2017   Procedure: PLACEMENT OF FUDUCIAL;  Surgeon: Grace Isaac, MD;  Location: [facility];  Service:  Thoracic;  Laterality: N/A;  . I&D of right arm    . INCISION AND DRAINAGE PERIRECTAL ABSCESS  03/2009  . Neg Stress Test  2000  . PERIPHERAL VASCULAR CATHETERIZATION N/A 11/30/2015   Procedure: Carotid PTA/Stent Intervention;  Surgeon: Serafina Mitchell, MD;  Location: [facility] CV LAB;  Service: Cardiovascular;  Laterality: N/A;  . ROTATOR CUFF REPAIR Right 2009   multiple f/u Sxs/infection Tx  . VASCULAR SURGERY    . VIDEO BRONCHOSCOPY WITH ENDOBRONCHIAL NAVIGATION N/A 05/25/2017   Procedure: VIDEO BRONCHOSCOPY WITH ENDOBRONCHIAL NAVIGATION;  Surgeon: Rigoberto Noel, MD;  Location: [facility];  Service: Thoracic;  Laterality: N/A;  . VIDEO BRONCHOSCOPY WITH ENDOBRONCHIAL NAVIGATION N/A 06/26/2017   Procedure: VIDEO BRONCHOSCOPY WITH ENDOBRONCHIAL NAVIGATION WITH PLACEMENT OF FIDUCIALS;  Surgeon: Grace Isaac, MD;  Location: [facility];  Service: Thoracic;  Laterality: N/A;  . VIDEO BRONCHOSCOPY WITH ENDOBRONCHIAL ULTRASOUND N/A 05/25/2017   Procedure: VIDEO BRONCHOSCOPY WITH ENDOBRONCHIAL ULTRASOUND;  Surgeon: Rigoberto Noel, MD;  Location: [facility] OR;  Service: Thoracic;  Laterality: N/A;    REVIEW OF SYSTEMS:   Review of Systems  Constitutional: Negative for appetite change, chills, fatigue, fever and unexpected weight change.  HENT:   Negative for mouth sores, nosebleeds, sore throat and trouble swallowing.   Eyes: Negative for eye problems and icterus.  Respiratory: Negative for cough, hemoptysis, shortness of breath and wheezing.   Cardiovascular: Negative for chest pain and leg swelling.  Gastrointestinal: Negative for abdominal pain, constipation, diarrhea, nausea and vomiting.  Genitourinary: Negative for bladder incontinence, difficulty urinating, dysuria, frequency and hematuria.   Musculoskeletal: Negative for back pain, gait problem, neck pain and neck stiffness.  Skin: Negative for itching and rash.  Neurological: Negative for dizziness, extremity weakness, gait problem, headaches,  light-headedness and seizures.  Hematological: Negative for adenopathy. Does not bruise/bleed easily.  Psychiatric/Behavioral: Negative for confusion, depression and sleep disturbance. The patient is not nervous/anxious.     PHYSICAL EXAMINATION:  Temperature 97.7, pulse 70, blood pressure 180/72, respirations 18, O2 sat 100% on room air  ECOG PERFORMANCE STATUS: 1 - Symptomatic but completely ambulatory  Physical Exam  Constitutional: Oriented to person, place, and time and well-developed, well-nourished, and in no distress. No distress.  HENT:  Head: Normocephalic and atraumatic.  Mouth/Throat: Oropharynx is clear and moist. No oropharyngeal exudate.  Eyes: Conjunctivae are normal. Right eye exhibits no discharge. Left eye exhibits no discharge. No scleral icterus.  Neck: Normal range of motion. Neck supple.  Cardiovascular: Normal rate, regular rhythm, normal heart sounds and intact distal pulses.   Pulmonary/Chest: Effort normal and breath sounds normal. No respiratory distress. No wheezes. No rales.  Abdominal: Soft. Bowel sounds are normal. Exhibits no distension and no mass. There is no tenderness.  Musculoskeletal: Normal range of motion. Exhibits no edema.  Lymphadenopathy:    No cervical adenopathy.  Neurological: Alert and oriented to person, place, and time. Exhibits normal muscle tone. Gait normal. Coordination normal.  Skin: Skin is warm and dry. No rash noted. Not diaphoretic. No erythema. No pallor.  Psychiatric: Mood, memory and judgment normal.  Vitals reviewed.  LABORATORY DATA: Lab Results  Component Value Date   WBC 4.5 08/27/2017   HGB 10.9 (L) 08/27/2017   HCT 35.0 (L) 08/27/2017   MCV 75.6 (L) 08/27/2017   PLT 129 (L) 08/27/2017      Chemistry      Component Value Date/Time   NA 137 08/27/2017 1143   NA 138 08/06/2017 0856   K 4.6 08/27/2017 1143   K 3.6 08/06/2017 0856   CL 106 08/27/2017 1143   CO2 25 08/27/2017 1143   CO2 33 (H) 08/06/2017  0856   BUN 19 08/27/2017 1143   BUN 16.0 08/06/2017 0856   CREATININE 1.14 08/27/2017 1143   CREATININE 1.3 08/06/2017 0856      Component Value Date/Time   CALCIUM 9.1 08/27/2017 1143   CALCIUM 9.1 08/06/2017 0856   ALKPHOS 142 08/27/2017 1143   ALKPHOS 139 08/06/2017 0856   AST 21 08/27/2017 1143   AST 15 08/06/2017 0856   ALT 13 08/27/2017 1143   ALT 9 08/06/2017 0856   BILITOT 0.2 08/27/2017 1143   BILITOT 0.35 08/06/2017 0856       RADIOGRAPHIC STUDIES:  No results found.   ASSESSMENT/PLAN:  Stage III squamous cell carcinoma of left lung (HCC) This  is a very pleasant 62 years old white male with questionable stage IA (T1b, N0, M0) versus IIIA (T1b, N2, M0) non-small cell lung cancer, squamous cell carcinoma presented with left upper lobe lung nodule as well as suspicious left prevascular lymph node diagnosed in November 2018 The patient is currently on a course of concurrent chemoradiation with weekly carboplatin for AUC of 2 and paclitaxel 45 mg/M2 to the left upper lobe lung nodule in addition to the prevascular lymphadenopathy.  Status post 2 cycles. He is tolerating his treatment fairly well.  He has missed several doses of his chemotherapy and he was reminded of the importance of staying on schedule for both his radiation and chemotherapy. The patient will proceed with cycle 3 of his treatment today as scheduled. He will return next week for labs and chemotherapy and will have a follow-up visit in 2 weeks for evaluation prior to cycle 5 of his treatment.  The patient was advised to call immediately if he has any concerning symptoms in the interval. The patient voices understanding of current disease status and treatment options and is in agreement with the current care plan.  All questions were answered. The patient knows to call the clinic with any problems, questions or concerns. We can certainly see the patient much sooner if necessary.  No orders of the defined  types were placed in this encounter.   Brad Bussing, DNP, AGPCNP-BC, AOCNP 08/27/17

## 2017-08-27 NOTE — Assessment & Plan Note (Signed)
This is a very pleasant 62 years old white male with questionable stage IA (T1b, N0, M0) versus IIIA (T1b, N2, M0) non-small cell lung cancer, squamous cell carcinoma presented with left upper lobe lung nodule as well as suspicious left prevascular lymph node diagnosed in November 2018 The patient is currently on a course of concurrent chemoradiation with weekly carboplatin for AUC of 2 and paclitaxel 45 mg/M2 to the left upper lobe lung nodule in addition to the prevascular lymphadenopathy.  Status post 2 cycles. He is tolerating his treatment fairly well.  He has missed several doses of his chemotherapy and he was reminded of the importance of staying on schedule for both his radiation and chemotherapy. The patient will proceed with cycle 3 of his treatment today as scheduled. He will return next week for labs and chemotherapy and will have a follow-up visit in 2 weeks for evaluation prior to cycle 5 of his treatment.  The patient was advised to call immediately if he has any concerning symptoms in the interval. The patient voices understanding of current disease status and treatment options and is in agreement with the current care plan.  All questions were answered. The patient knows to call the clinic with any problems, questions or concerns. We can certainly see the patient much sooner if necessary.

## 2017-08-28 ENCOUNTER — Ambulatory Visit
Admission: RE | Admit: 2017-08-28 | Discharge: 2017-08-28 | Disposition: A | Discharge status: Home / Self Care | Payer: Medicare Other | Source: Ambulatory Visit | Attending: Radiation Oncology | Admitting: Radiation Oncology

## 2017-08-28 ENCOUNTER — Ambulatory Visit: Payer: Medicare Other

## 2017-08-28 DIAGNOSIS — F17210 Nicotine dependence, cigarettes, uncomplicated: Secondary | ICD-10-CM | POA: Diagnosis not present

## 2017-08-28 DIAGNOSIS — D649 Anemia, unspecified: Secondary | ICD-10-CM | POA: Diagnosis not present

## 2017-08-28 DIAGNOSIS — Z510 Encounter for antineoplastic radiation therapy: Secondary | ICD-10-CM | POA: Diagnosis not present

## 2017-08-28 DIAGNOSIS — C3412 Malignant neoplasm of upper lobe, left bronchus or lung: Secondary | ICD-10-CM | POA: Diagnosis not present

## 2017-08-28 DIAGNOSIS — M129 Arthropathy, unspecified: Secondary | ICD-10-CM | POA: Diagnosis not present

## 2017-08-28 DIAGNOSIS — I6529 Occlusion and stenosis of unspecified carotid artery: Secondary | ICD-10-CM | POA: Diagnosis not present

## 2017-08-29 ENCOUNTER — Ambulatory Visit
Admission: RE | Admit: 2017-08-29 | Discharge: 2017-08-29 | Disposition: A | Discharge status: Home / Self Care | Payer: Medicare Other | Source: Ambulatory Visit | Attending: Radiation Oncology | Admitting: Radiation Oncology

## 2017-08-29 ENCOUNTER — Ambulatory Visit: Payer: Medicare Other

## 2017-08-29 DIAGNOSIS — Z510 Encounter for antineoplastic radiation therapy: Secondary | ICD-10-CM | POA: Diagnosis not present

## 2017-08-29 DIAGNOSIS — M129 Arthropathy, unspecified: Secondary | ICD-10-CM | POA: Diagnosis not present

## 2017-08-29 DIAGNOSIS — I6529 Occlusion and stenosis of unspecified carotid artery: Secondary | ICD-10-CM | POA: Diagnosis not present

## 2017-08-29 DIAGNOSIS — D649 Anemia, unspecified: Secondary | ICD-10-CM | POA: Diagnosis not present

## 2017-08-29 DIAGNOSIS — C3412 Malignant neoplasm of upper lobe, left bronchus or lung: Secondary | ICD-10-CM | POA: Diagnosis not present

## 2017-08-29 DIAGNOSIS — F17210 Nicotine dependence, cigarettes, uncomplicated: Secondary | ICD-10-CM | POA: Diagnosis not present

## 2017-08-30 ENCOUNTER — Ambulatory Visit
Admission: RE | Admit: 2017-08-30 | Discharge: 2017-08-30 | Disposition: A | Discharge status: Home / Self Care | Payer: Medicare Other | Source: Ambulatory Visit | Attending: Radiation Oncology | Admitting: Radiation Oncology

## 2017-08-30 DIAGNOSIS — M129 Arthropathy, unspecified: Secondary | ICD-10-CM | POA: Diagnosis not present

## 2017-08-30 DIAGNOSIS — Z510 Encounter for antineoplastic radiation therapy: Secondary | ICD-10-CM | POA: Diagnosis not present

## 2017-08-30 DIAGNOSIS — D649 Anemia, unspecified: Secondary | ICD-10-CM | POA: Diagnosis not present

## 2017-08-30 DIAGNOSIS — I6529 Occlusion and stenosis of unspecified carotid artery: Secondary | ICD-10-CM | POA: Diagnosis not present

## 2017-08-30 DIAGNOSIS — F17210 Nicotine dependence, cigarettes, uncomplicated: Secondary | ICD-10-CM | POA: Diagnosis not present

## 2017-08-30 DIAGNOSIS — C3412 Malignant neoplasm of upper lobe, left bronchus or lung: Secondary | ICD-10-CM | POA: Diagnosis not present

## 2017-08-31 ENCOUNTER — Ambulatory Visit: Payer: Medicare Other

## 2017-08-31 ENCOUNTER — Ambulatory Visit
Admission: RE | Admit: 2017-08-31 | Discharge: 2017-08-31 | Disposition: A | Discharge status: Home / Self Care | Payer: Medicare Other | Source: Ambulatory Visit | Attending: Radiation Oncology | Admitting: Radiation Oncology

## 2017-08-31 DIAGNOSIS — D649 Anemia, unspecified: Secondary | ICD-10-CM | POA: Diagnosis not present

## 2017-08-31 DIAGNOSIS — C3412 Malignant neoplasm of upper lobe, left bronchus or lung: Secondary | ICD-10-CM | POA: Diagnosis not present

## 2017-08-31 DIAGNOSIS — F17210 Nicotine dependence, cigarettes, uncomplicated: Secondary | ICD-10-CM | POA: Diagnosis not present

## 2017-08-31 DIAGNOSIS — I6529 Occlusion and stenosis of unspecified carotid artery: Secondary | ICD-10-CM | POA: Diagnosis not present

## 2017-08-31 DIAGNOSIS — M129 Arthropathy, unspecified: Secondary | ICD-10-CM | POA: Diagnosis not present

## 2017-08-31 DIAGNOSIS — Z510 Encounter for antineoplastic radiation therapy: Secondary | ICD-10-CM | POA: Diagnosis not present

## 2017-09-03 ENCOUNTER — Ambulatory Visit: Payer: Medicare Other

## 2017-09-03 ENCOUNTER — Other Ambulatory Visit: Payer: Self-pay

## 2017-09-03 ENCOUNTER — Encounter (HOSPITAL_COMMUNITY): Payer: Self-pay | Admitting: Emergency Medicine

## 2017-09-03 ENCOUNTER — Ambulatory Visit (HOSPITAL_BASED_OUTPATIENT_CLINIC_OR_DEPARTMENT_OTHER): Payer: Medicare Other | Admitting: Medical

## 2017-09-03 ENCOUNTER — Inpatient Hospital Stay: Payer: Medicare Other

## 2017-09-03 ENCOUNTER — Emergency Department (HOSPITAL_COMMUNITY)
Admission: EM | Admit: 2017-09-03 | Discharge: 2017-09-03 | Disposition: A | Discharge status: Home / Self Care | Payer: Medicare Other | Attending: Emergency Medicine | Admitting: Emergency Medicine

## 2017-09-03 ENCOUNTER — Ambulatory Visit
Admission: RE | Admit: 2017-09-03 | Discharge: 2017-09-03 | Disposition: A | Discharge status: Home / Self Care | Payer: Medicare Other | Source: Ambulatory Visit | Attending: Radiation Oncology | Admitting: Radiation Oncology

## 2017-09-03 VITALS — BP 222/84 | HR 64 | Temp 98.3°F | Resp 16 | Wt 241.0 lb

## 2017-09-03 DIAGNOSIS — Z7982 Long term (current) use of aspirin: Secondary | ICD-10-CM | POA: Insufficient documentation

## 2017-09-03 DIAGNOSIS — Z8673 Personal history of transient ischemic attack (TIA), and cerebral infarction without residual deficits: Secondary | ICD-10-CM

## 2017-09-03 DIAGNOSIS — I10 Essential (primary) hypertension: Secondary | ICD-10-CM | POA: Diagnosis not present

## 2017-09-03 DIAGNOSIS — J449 Chronic obstructive pulmonary disease, unspecified: Secondary | ICD-10-CM | POA: Insufficient documentation

## 2017-09-03 DIAGNOSIS — Z7984 Long term (current) use of oral hypoglycemic drugs: Secondary | ICD-10-CM | POA: Diagnosis not present

## 2017-09-03 DIAGNOSIS — Z9221 Personal history of antineoplastic chemotherapy: Secondary | ICD-10-CM | POA: Diagnosis not present

## 2017-09-03 DIAGNOSIS — C3411 Malignant neoplasm of upper lobe, right bronchus or lung: Secondary | ICD-10-CM | POA: Diagnosis not present

## 2017-09-03 DIAGNOSIS — M129 Arthropathy, unspecified: Secondary | ICD-10-CM | POA: Diagnosis not present

## 2017-09-03 DIAGNOSIS — C3412 Malignant neoplasm of upper lobe, left bronchus or lung: Secondary | ICD-10-CM | POA: Diagnosis not present

## 2017-09-03 DIAGNOSIS — E119 Type 2 diabetes mellitus without complications: Secondary | ICD-10-CM | POA: Diagnosis not present

## 2017-09-03 DIAGNOSIS — I6529 Occlusion and stenosis of unspecified carotid artery: Secondary | ICD-10-CM | POA: Diagnosis not present

## 2017-09-03 DIAGNOSIS — I169 Hypertensive crisis, unspecified: Secondary | ICD-10-CM | POA: Diagnosis not present

## 2017-09-03 DIAGNOSIS — I119 Hypertensive heart disease without heart failure: Secondary | ICD-10-CM

## 2017-09-03 DIAGNOSIS — Z85118 Personal history of other malignant neoplasm of bronchus and lung: Secondary | ICD-10-CM | POA: Diagnosis not present

## 2017-09-03 DIAGNOSIS — F17210 Nicotine dependence, cigarettes, uncomplicated: Secondary | ICD-10-CM | POA: Diagnosis not present

## 2017-09-03 DIAGNOSIS — Z510 Encounter for antineoplastic radiation therapy: Secondary | ICD-10-CM | POA: Diagnosis not present

## 2017-09-03 DIAGNOSIS — Z5111 Encounter for antineoplastic chemotherapy: Secondary | ICD-10-CM | POA: Diagnosis not present

## 2017-09-03 DIAGNOSIS — C3492 Malignant neoplasm of unspecified part of left bronchus or lung: Secondary | ICD-10-CM

## 2017-09-03 DIAGNOSIS — Z79899 Other long term (current) drug therapy: Secondary | ICD-10-CM | POA: Diagnosis not present

## 2017-09-03 DIAGNOSIS — D649 Anemia, unspecified: Secondary | ICD-10-CM | POA: Diagnosis not present

## 2017-09-03 DIAGNOSIS — E039 Hypothyroidism, unspecified: Secondary | ICD-10-CM | POA: Insufficient documentation

## 2017-09-03 LAB — CBC WITH DIFFERENTIAL/PLATELET
Basophils Absolute: 0 10*3/uL (ref 0.0–0.1)
Basophils Relative: 1 %
Eosinophils Absolute: 0.2 10*3/uL (ref 0.0–0.5)
Eosinophils Relative: 6 %
HCT: 31.3 % — ABNORMAL LOW (ref 38.4–49.9)
Hemoglobin: 10 g/dL — ABNORMAL LOW (ref 13.0–17.1)
Lymphocytes Relative: 20 %
Lymphs Abs: 0.5 10*3/uL — ABNORMAL LOW (ref 0.9–3.3)
MCH: 23.3 pg — ABNORMAL LOW (ref 27.2–33.4)
MCHC: 31.9 g/dL — ABNORMAL LOW (ref 32.0–36.0)
MCV: 73.1 fL — ABNORMAL LOW (ref 79.3–98.0)
Monocytes Absolute: 0.2 10*3/uL (ref 0.1–0.9)
Monocytes Relative: 9 %
Neutro Abs: 1.6 10*3/uL (ref 1.5–6.5)
Neutrophils Relative %: 64 %
Platelets: 218 10*3/uL (ref 140–400)
RBC: 4.28 MIL/uL (ref 4.20–5.82)
RDW: 20.8 % — ABNORMAL HIGH (ref 11.0–15.6)
WBC: 2.5 10*3/uL — ABNORMAL LOW (ref 4.0–10.3)

## 2017-09-03 LAB — COMPREHENSIVE METABOLIC PANEL
ALT: 13 U/L (ref 0–55)
AST: 19 U/L (ref 5–34)
Albumin: 2.4 g/dL — ABNORMAL LOW (ref 3.5–5.0)
Alkaline Phosphatase: 136 U/L (ref 40–150)
Anion gap: 5 (ref 3–11)
BUN: 19 mg/dL (ref 7–26)
CO2: 28 mmol/L (ref 22–29)
Calcium: 8.7 mg/dL (ref 8.4–10.4)
Chloride: 105 mmol/L (ref 98–109)
Creatinine, Ser: 1.23 mg/dL (ref 0.70–1.30)
GFR calc Af Amer: >60 mL/min (ref >60)
GFR calc non Af Amer: >60 mL/min (ref >60)
Glucose, Bld: 106 mg/dL (ref 70–140)
Potassium: 4.6 mmol/L (ref 3.5–5.1)
Sodium: 138 mmol/L (ref 136–145)
Total Bilirubin: <0.2 mg/dL — ABNORMAL LOW (ref 0.2–1.2)
Total Protein: 6.5 g/dL (ref 6.4–8.3)

## 2017-09-03 MED ORDER — FUROSEMIDE 40 MG PO TABS
80.0000 mg | ORAL_TABLET | Freq: Every day | ORAL | Status: DC
  Administered 2017-09-03: 80 mg via ORAL
  Filled 2017-09-03: qty 2

## 2017-09-03 MED ORDER — CLONIDINE HCL 0.1 MG PO TABS
0.2000 mg | ORAL_TABLET | Freq: Once | ORAL | Status: AC
  Administered 2017-09-03: 0.2 mg via ORAL

## 2017-09-03 MED ORDER — AMLODIPINE BESYLATE 5 MG PO TABS
5.0000 mg | ORAL_TABLET | Freq: Every day | ORAL | Status: DC
  Administered 2017-09-03: 5 mg via ORAL
  Filled 2017-09-03: qty 1

## 2017-09-03 MED ORDER — LISINOPRIL 20 MG PO TABS
20.0000 mg | ORAL_TABLET | Freq: Every day | ORAL | Status: DC
  Administered 2017-09-03: 20 mg via ORAL
  Filled 2017-09-03: qty 1

## 2017-09-03 MED ORDER — CLONIDINE HCL 0.1 MG PO TABS
ORAL_TABLET | ORAL | Status: AC
  Filled 2017-09-03: qty 2

## 2017-09-03 NOTE — ED Notes (Signed)
Waiting on meds from pharmacy

## 2017-09-03 NOTE — ED Notes (Signed)
Bed: RESB Expected date:  Expected time:  Means of arrival:  Comments: Pt from cancer center, hypertensive crisis

## 2017-09-03 NOTE — Patient Instructions (Signed)
[facility] Discharge Instructions for Patients Receiving Chemotherapy  Today you received the following chemotherapy agents taxol/carboplatin  To help prevent nausea and vomiting after your treatment, we encourage you to take your nausea medication as directed   If you develop nausea and vomiting that is not controlled by your nausea medication, call the clinic.   BELOW ARE SYMPTOMS THAT SHOULD BE REPORTED IMMEDIATELY:  *FEVER GREATER THAN 100.5 F  *CHILLS WITH OR WITHOUT FEVER  NAUSEA AND VOMITING THAT IS NOT CONTROLLED WITH YOUR NAUSEA MEDICATION  *UNUSUAL SHORTNESS OF BREATH  *UNUSUAL BRUISING OR BLEEDING  TENDERNESS IN MOUTH AND THROAT WITH OR WITHOUT PRESENCE OF ULCERS  *URINARY PROBLEMS  *BOWEL PROBLEMS  UNUSUAL RASH Items with * indicate a potential emergency and should be followed up as soon as possible.  Feel free to call the clinic you have any questions or concerns. The clinic phone number is [phone].  

## 2017-09-03 NOTE — Progress Notes (Signed)
[facility] Client notified about pt's BP on arrival. Pt normally takes 20 mg PO Lisinopril, 5 mg PO Norvasc, and 80 mg PO Lasix, but did not take prior to appt today.   Received orders to administer 0.2 mg PO of Clonidine and recheck vitals in 10 minutes. No improvement at 10 minute mark. Updated [facility] Client who spoke with Dr. Julien Nordmann and was told to send pt to ED for hypertensive crisis. Plan explained to pt who verbalized understanding and agreement. Will continue to monitor    09/03/17 1255  Vitals  BP (!) 224/88 (after PO Clonidine)  BP Location Left Arm  Patient Position Sitting  Pulse Rate 77

## 2017-09-03 NOTE — ED Provider Notes (Signed)
[facility] DEPT Provider Note   CSN: [id number] Arrival date & time: 09/03/17  1331     History   Chief Complaint Chief Complaint  Patient presents with  . Hypertension    HPI JYMIR DUNAJ is a 62 y.o. male.  62 year old male presents with cancer center with increased blood pressure.  He has been asymptomatic.  Admits to not taking his daily dose of his Norvasc, lisinopril, Lasix.  He also admits to increase sodium intake consisting of sausage x2.  No headache, chest pain, shortness of breath.  Feels at his baseline.  Was given clonidine 0.1 mg at the cancer center they waited 10 minutes repeat his blood pressure which is too elevated and he presents at this time.      Past Medical History:  Diagnosis Date  . Anemia    takes Ferrous Sulfated daily  . Arthritis   . Carotid stenosis 08/27/2014   takes Aspirin daily  . Chronic hepatitis C ([place]) 09/17/2015  . COPD (chronic obstructive pulmonary disease) (HCC)    Albuterol daily as needed and Spiriva daily  . Depression   . Diabetes mellitus type II    takes Metformin and Glipizide daily  . Dizziness   . GERD (gastroesophageal reflux disease)   . GI bleed 12/2015  . Headache    occasionally  . History of blood clots    in right leg;being followed by Dr.Brabham for this  . History of blood transfusion 09/2014   2 units-no abnormal reaction noted  . History of gastric ulcer   . History of hiatal hernia   . HTN (hypertension) 09/14/2015  . Hyperlipidemia 08/27/2014   takes Simvastatin daily  . Hypertension    takes Amlodipine and Lisinopril daily  . Hypothyroidism 08/27/2014   takes Synthroid daily  . Insomnia    takes Pamelor nightly  . Joint pain    hips  . Muscle spasm    takes Zanaflex daily as needed  . MVP (mitral valve prolapse)   . Peripheral edema    takes Furosemide daily as needed  . Peripheral neuropathy   . Prurigo nodularis   . Shortness of breath dyspnea   . Stroke  ([place])   . Urinary frequency   . Urinary urgency     Patient Active Problem List   Diagnosis Date Noted  . Encounter for antineoplastic chemotherapy 08/27/2017  . Stage III squamous cell carcinoma of left lung ([place]) 06/29/2017  . Mediastinal lymphadenopathy   . Pleural effusion on left   . Lung mass 04/27/2017  . Abnormal chest x-ray 04/13/2017  . Loss of weight 05/10/2016  . Grief reaction 05/10/2016  . Preop exam for internal medicine 05/10/2016  . Liver fibrosis 03/09/2016  . Smoker 01/23/2016  . Peripheral vascular disease ([place]) 01/19/2016  . GI bleed 12/08/2015  . Symptomatic anemia 12/08/2015  . Diastolic CHF ([place]) 54/27/0623  . Hypersomnolence 12/07/2015  . Carotid stenosis, symptomatic, with infarction ([place]) 11/30/2015  . HLD (hyperlipidemia) 11/12/2015  . S/P carotid endarterectomy 11/12/2015  . Secondary cardiomyopathy ([place]) 10/14/2015  . Acute CVA (cerebrovascular accident) ([place]) 09/28/2015  . Stroke (cerebrum) ([place]) 09/27/2015  . Facial droop 09/27/2015  . Chronic hepatitis C ([place]) 09/17/2015  . Skin lesion 09/14/2015  . Insect bite, infected 09/14/2015  . Left shoulder pain 06/08/2015  . Hypoalbuminemia 02/17/2015  . Elevated alkaline phosphatase level 02/17/2015  . Essential hypertension 02/16/2015  . Carotid stenosis 11/13/2014  . Atherosclerotic peripheral vascular disease with intermittent claudication ([place])   .  Duodenal ulcer disease   . Anemia   . Tobacco abuse 09/23/2014  . Hypothyroidism 08/27/2014  . Bilateral carotid artery stenosis   . Diabetes ([place]) 05/25/2014  . Hypertensive heart disease   . History of CVA (cerebrovascular accident) without residual deficits   . Obesity (BMI 30-39.9) 01/19/2014  . Erectile dysfunction 10/10/2013  . Chronic LBP   . COPD (chronic obstructive pulmonary disease) ([place]) 03/17/2010  . GERD 03/17/2010    Past Surgical History:  Procedure Laterality Date  . ENDARTERECTOMY Left 11/13/2014   Procedure:  ENDARTERECTOMY CAROTID WITH PATCH ANGIOPLASTY;  Surgeon: Serafina Mitchell, MD;  Location: [facility] OR;  Service: Vascular;  Laterality: Left;  . ESOPHAGOGASTRODUODENOSCOPY N/A 09/24/2014   Procedure: ESOPHAGOGASTRODUODENOSCOPY (EGD);  Surgeon: Winfield Cunas., MD;  Location: [facility] ENDOSCOPY;  Service: Endoscopy;  Laterality: N/A;  . FUDUCIAL PLACEMENT N/A 06/26/2017   Procedure: PLACEMENT OF FUDUCIAL;  Surgeon: Grace Isaac, MD;  Location: [facility];  Service: Thoracic;  Laterality: N/A;  . I&D of right arm    . INCISION AND DRAINAGE PERIRECTAL ABSCESS  03/2009  . Neg Stress Test  2000  . PERIPHERAL VASCULAR CATHETERIZATION N/A 11/30/2015   Procedure: Carotid PTA/Stent Intervention;  Surgeon: Serafina Mitchell, MD;  Location: [facility] CV LAB;  Service: Cardiovascular;  Laterality: N/A;  . ROTATOR CUFF REPAIR Right 2009   multiple f/u Sxs/infection Tx  . VASCULAR SURGERY    . VIDEO BRONCHOSCOPY WITH ENDOBRONCHIAL NAVIGATION N/A 05/25/2017   Procedure: VIDEO BRONCHOSCOPY WITH ENDOBRONCHIAL NAVIGATION;  Surgeon: Rigoberto Noel, MD;  Location: [facility];  Service: Thoracic;  Laterality: N/A;  . VIDEO BRONCHOSCOPY WITH ENDOBRONCHIAL NAVIGATION N/A 06/26/2017   Procedure: VIDEO BRONCHOSCOPY WITH ENDOBRONCHIAL NAVIGATION WITH PLACEMENT OF FIDUCIALS;  Surgeon: Grace Isaac, MD;  Location: [facility];  Service: Thoracic;  Laterality: N/A;  . VIDEO BRONCHOSCOPY WITH ENDOBRONCHIAL ULTRASOUND N/A 05/25/2017   Procedure: VIDEO BRONCHOSCOPY WITH ENDOBRONCHIAL ULTRASOUND;  Surgeon: Rigoberto Noel, MD;  Location: [facility];  Service: Thoracic;  Laterality: N/A;       Home Medications    Prior to Admission medications   Medication Sig Start Date End Date Taking? Authorizing Provider  amLODipine (NORVASC) 5 MG tablet Take 1 tablet (5 mg total) daily by mouth. Patient not taking: Reported on 07/11/2017 06/11/17   Biagio Borg, MD  aspirin EC 81 MG tablet Take 1 tablet (81 mg total) by mouth daily. 12/21/15   Biagio Borg, MD  ATROVENT HFA 17 MCG/ACT inhaler INHALE 2 PUFFS INTO THE LUNGS FOUR TIMES DAILY Patient taking differently: INHALE 2 PUFFS INTO THE LUNGS 2-3 TIMES DAILY 03/07/17   Biagio Borg, MD  Blood Pressure Monitor DEVI Use to check Blood pressure daily 05/25/14   Biagio Borg, MD  ferrous sulfate 325 (65 FE) MG tablet Take 1 tablet (325 mg total) by mouth 3 (three) times daily with meals. Patient taking differently: Take 325 mg 2 (two) times daily by mouth. Once to twice daily 09/25/14   Regalado, Belkys A, MD  furosemide (LASIX) 80 MG tablet Take 1 tablet (80 mg total) daily by mouth. 06/11/17   Biagio Borg, MD  gabapentin (NEURONTIN) 600 MG tablet TAKE 1 TABLET(600 MG) BY MOUTH THREE TIMES DAILY Patient taking differently: Take 600 mg 3 (three) times daily as needed by mouth (for nerve pain/neuropathy.). TAKE 1 TABLET(600 MG) BY MOUTH THREE TIMES DAILY 06/11/17   Biagio Borg, MD  glucose blood (ACCU-CHEK AVIVA PLUS) test strip Use  to check blood sugars daily Dx E11.9 Patient not taking: Reported on 07/11/2017 11/09/15   Biagio Borg, MD  Lancets MISC 1 each by Other route See admin instructions. Reported on 09/07/2015    [provider]  levothyroxine (SYNTHROID, LEVOTHROID) 112 MCG tablet Take 1 tablet (112 mcg total) daily by mouth. Patient not taking: Reported on 06/29/2017 06/11/17   Biagio Borg, MD  lisinopril (PRINIVIL,ZESTRIL) 20 MG tablet Take 1 tablet (20 mg total) daily by mouth. 06/11/17   Biagio Borg, MD  metolazone (ZAROXOLYN) 5 MG tablet Take 1 tablet (5 mg total) daily as needed by mouth. 06/11/17 09/09/17  Biagio Borg, MD  potassium chloride (K-DUR) 10 MEQ tablet TAKE 2 TABLETS BY MOUTH EVERY DAY IN THE MORNING 06/11/17   Biagio Borg, MD  prochlorperazine (COMPAZINE) 10 MG tablet TAKE 1 TABLET(10 MG) BY MOUTH EVERY 6 HOURS AS NEEDED FOR NAUSEA OR VOMITING Patient not taking: Reported on 07/11/2017 06/29/17   Curt Bears, MD  Respiratory Therapy Supplies  (NEBULIZER) DEVI Use as directed four times daily 09/14/15   Biagio Borg, MD  tiZANidine (ZANAFLEX) 4 MG tablet Take 1 tablet (4 mg total) every 6 (six) hours as needed by mouth for muscle spasms. 06/11/17   Biagio Borg, MD  umeclidinium-vilanterol (ANORO ELLIPTA) 62.5-25 MCG/INH AEPB Inhale 1 puff into the lungs daily. 05/30/17   Rigoberto Noel, MD    Family History Family History  Problem Relation Age of Onset  . Heart disease Mother        automated implantable cardioverter-defibrillator   . Diabetes type II Mother   . Alcohol abuse Father   . Lung cancer Father   . Lung cancer Maternal Grandmother   . Lung cancer Sister     Social History Social History   Tobacco Use  . Smoking status: Current Every Day Smoker    Packs/day: 1.00    Years: 48.00    Pack years: 48.00    Types: Cigarettes  . Smokeless tobacco: Never Used  . Tobacco comment: 1-2 pks per day  Substance Use Topics  . Alcohol use: Yes    Alcohol/week: 0.6 oz    Types: 1 Cans of beer per week    Comment: rarely  . Drug use: No     Allergies   Doxycycline; Lipitor [atorvastatin]; and Oxycodone   Review of Systems Review of Systems  All other systems reviewed and are negative.    Physical Exam Updated Vital Signs BP (!) 225/93   Pulse 75   Temp 97.9 F (36.6 C)   Resp 18   SpO2 100%   Physical Exam  Constitutional: He is oriented to person, place, and time. He appears well-developed and well-nourished.  Non-toxic appearance. No distress.  HENT:  Head: Normocephalic and atraumatic.  Eyes: Conjunctivae, EOM and lids are normal. Pupils are equal, round, and reactive to light.  Neck: Normal range of motion. Neck supple. No tracheal deviation present. No thyroid mass present.  Cardiovascular: Normal rate, regular rhythm and normal heart sounds. Exam reveals no gallop.  No murmur heard. Pulmonary/Chest: Effort normal and breath sounds normal. No stridor. No respiratory distress. He has no  decreased breath sounds. He has no wheezes. He has no rhonchi. He has no rales.  Abdominal: Soft. Normal appearance and bowel sounds are normal. He exhibits no distension. There is no tenderness. There is no rebound and no CVA tenderness.  Musculoskeletal: Normal range of motion. He exhibits no edema or  tenderness.  Neurological: He is alert and oriented to person, place, and time. He has normal strength. No cranial nerve deficit or sensory deficit. GCS eye subscore is 4. GCS verbal subscore is 5. GCS motor subscore is 6.  Skin: Skin is warm and dry. No abrasion and no rash noted.  Psychiatric: He has a normal mood and affect. His speech is normal and behavior is normal.  Nursing note and vitals reviewed.    ED Treatments / Results  Labs (all labs ordered are listed, but only abnormal results are displayed) Labs Reviewed - No data to display  EKG  EKG Interpretation  Date/Time:  Monday September 03 2017 13:42:17 EST Ventricular Rate:  71 PR Interval:    QRS Duration: 128 QT Interval:  458 QTC Calculation: 498 R Axis:   -54 Text Interpretation:  Sinus rhythm Prolonged PR interval Probable left atrial enlargement Right bundle branch block LVH with secondary repolarization abnormality Anterior infarct, old No significant change since last tracing Confirmed by Lacretia Leigh ([id number]) on 09/03/2017 1:46:04 PM       Radiology No results found.  Procedures Procedures (including critical care time)  Medications Ordered in ED Medications  amLODipine (NORVASC) tablet 5 mg (not administered)  lisinopril (PRINIVIL,ZESTRIL) tablet 20 mg (not administered)  furosemide (LASIX) tablet 80 mg (not administered)     Initial Impression / Assessment and Plan / ED Course  I have reviewed the triage vital signs and the nursing notes.  Pertinent labs & imaging results that were available during my care of the patient were reviewed by me and considered in my medical decision making (see chart for  details).       Final Clinical Impressions(s) / ED Diagnoses   Final diagnoses:  None    ED Discharge Orders    None    Patient given his normal daily blood pressure medication and will follow up with his doctor.   Lacretia Leigh, MD 09/03/17 1356

## 2017-09-03 NOTE — ED Triage Notes (Signed)
Pt from cancer center with BP 222/84 after clonidine administration. States he didn't have his meds this morning. Denies pain, headache or visual changes. Hx of stroke.

## 2017-09-04 ENCOUNTER — Ambulatory Visit: Payer: Medicare Other

## 2017-09-04 ENCOUNTER — Ambulatory Visit
Admission: RE | Admit: 2017-09-04 | Discharge: 2017-09-04 | Disposition: A | Discharge status: Home / Self Care | Payer: Medicare Other | Source: Ambulatory Visit | Attending: Radiation Oncology | Admitting: Radiation Oncology

## 2017-09-04 DIAGNOSIS — F17210 Nicotine dependence, cigarettes, uncomplicated: Secondary | ICD-10-CM | POA: Diagnosis not present

## 2017-09-04 DIAGNOSIS — Z510 Encounter for antineoplastic radiation therapy: Secondary | ICD-10-CM | POA: Diagnosis not present

## 2017-09-04 DIAGNOSIS — I6529 Occlusion and stenosis of unspecified carotid artery: Secondary | ICD-10-CM | POA: Diagnosis not present

## 2017-09-04 DIAGNOSIS — M129 Arthropathy, unspecified: Secondary | ICD-10-CM | POA: Diagnosis not present

## 2017-09-04 DIAGNOSIS — C3412 Malignant neoplasm of upper lobe, left bronchus or lung: Secondary | ICD-10-CM | POA: Diagnosis not present

## 2017-09-04 DIAGNOSIS — D649 Anemia, unspecified: Secondary | ICD-10-CM | POA: Diagnosis not present

## 2017-09-04 NOTE — Progress Notes (Signed)
Sympt[facility] Management Clinic Progress Note   Brad [name] [id number] Jan 06, 1956 62 y.o.  [name] is managed by Dr. Eilleen Kempf  Actively treated with chemotherapy: yes  Current Therapy: Carboplatin and paclitaxel  Last Treated: 08/27/2017  Assessment: Plan:    Hypertensive crisis  Stage III squamous cell carcinoma of left lung ([place])   Hypertensive crisis: The patient was noted to have a blood pressure of 235/101 today.  He did not take his lisinopril, Norvasc, or Lasix this morning.  He was given clonidine 0.2 mg p.o. x1 with his blood pressure only dropping to 224/88.  Patient has had a history of strokes.  After discussion with Dr. Julien Nordmann the patient was taken to the emergency room for management of his hypertension.  Stage III squamous cell carcinoma of the left lung: The patient was scheduled to have cycle 4-day 1 of therapy today.  His chemotherapy was held due to his hypertension.  The patient will need to have this treatment rescheduled.  Please see After Visit Summary for patient specific instructions.  Future Appointments  Date Time Provider [place]  09/05/2017  9:15 AM [facility] [id number] [facility] None  09/06/2017  9:15 AM [facility] [id number] [facility] None  09/07/2017  9:15 AM [facility] [id number] [facility] None  09/10/2017  9:15 AM [facility] [id number] [facility] None  09/10/2017  9:45 AM [facility] LAB 3 [facility] None  09/10/2017 10:00 AM Curt Bears, MD [facility] None  09/10/2017 11:00 AM [facility] A1 [facility] None  09/11/2017  8:00 AM Jerline Pain, MD [id number]  09/11/2017  9:15 AM [facility] [id number] [facility] None  09/11/2017  9:20 AM Biagio Borg, MD [id number]  09/12/2017  9:15 AM [facility] [id number] [facility] None  09/13/2017  9:15 AM [facility] [id number] [facility] None  09/14/2017  9:15 AM [facility] [id number] [facility] None  09/17/2017  9:15 AM [facility] [id number] [facility]  None  09/17/2017  9:45 AM [facility] LAB 2 [facility] None  09/17/2017 10:45 AM [facility] G22 [facility] None  09/18/2017  9:15 AM [facility] [id number] [facility] None  09/19/2017  9:15 AM [facility] [id number] [facility] None  09/20/2017  9:15 AM [facility] [id number] [facility] None  09/21/2017  9:15 AM [facility] [id number] [facility] None  09/24/2017  9:45 AM [facility] LAB 2 [facility] None  09/24/2017 10:00 AM Curt Bears, MD [facility] None    No orders of the defined types were placed in this encounter.      Subjective:   Patient ID:  Brad [name] is a 62 y.o. (DOB 1956/02/18) male.  Chief Complaint: No chief complaint on file.   HPI Brad [name] is a 62 year old male with stage III squamous cell carcinoma of the left lung who was to have cycle 4-day 1 of carboplatin and paclitaxel dose today.  Additionally the patient has a history of hypertension, diabetes, and strokes.  He was found to have a blood pressure of 235/101 when he presented for treatment today.  He is on lisinopril 20 mg, Norvasc 5 mg, and Lasix 80 mg.  He reports that he did not take any of these medicines this morning.  He denies chest pain, chest tightness, shortness of breath, visual changes, or weakness.  He was given clonidine 0.2 mg p.o. x1 with his blood pressure only dropping to 224/88.  Medications: I have reviewed the patient's current medications.  Allergies:  Allergies  Allergen Reactions  . Doxycycline Hives and Itching  . Lipitor [Atorvastatin] Other (See Comments)    Myalgia   . Oxycodone Itching  Past Medical History:  Diagnosis Date  . Anemia    takes Ferrous Sulfated daily  . Arthritis   . Carotid stenosis 08/27/2014   takes Aspirin daily  . Chronic hepatitis C ([place]) 09/17/2015  . COPD (chronic obstructive pulmonary disease) (HCC)    Albuterol daily as needed and Spiriva daily  . Depression   . Diabetes mellitus type II    takes Metformin and Glipizide  daily  . Dizziness   . GERD (gastroesophageal reflux disease)   . GI bleed 12/2015  . Headache    occasionally  . History of blood clots    in right leg;being followed by Dr.Brabham for this  . History of blood transfusion 09/2014   2 units-no abnormal reaction noted  . History of gastric ulcer   . History of hiatal hernia   . HTN (hypertension) 09/14/2015  . Hyperlipidemia 08/27/2014   takes Simvastatin daily  . Hypertension    takes Amlodipine and Lisinopril daily  . Hypothyroidism 08/27/2014   takes Synthroid daily  . Insomnia    takes Pamelor nightly  . Joint pain    hips  . Muscle spasm    takes Zanaflex daily as needed  . MVP (mitral valve prolapse)   . Peripheral edema    takes Furosemide daily as needed  . Peripheral neuropathy   . Prurigo nodularis   . Shortness of breath dyspnea   . Stroke ([place])   . Urinary frequency   . Urinary urgency     Past Surgical History:  Procedure Laterality Date  . ENDARTERECTOMY Left 11/13/2014   Procedure: ENDARTERECTOMY CAROTID WITH PATCH ANGIOPLASTY;  Surgeon: Serafina Mitchell, MD;  Location: [facility] OR;  Service: Vascular;  Laterality: Left;  . ESOPHAGOGASTRODUODENOSCOPY N/A 09/24/2014   Procedure: ESOPHAGOGASTRODUODENOSCOPY (EGD);  Surgeon: Winfield Cunas., MD;  Location: [facility] ENDOSCOPY;  Service: Endoscopy;  Laterality: N/A;  . FUDUCIAL PLACEMENT N/A 06/26/2017   Procedure: PLACEMENT OF FUDUCIAL;  Surgeon: Grace Isaac, MD;  Location: [facility];  Service: Thoracic;  Laterality: N/A;  . I&D of right arm    . INCISION AND DRAINAGE PERIRECTAL ABSCESS  03/2009  . Neg Stress Test  2000  . PERIPHERAL VASCULAR CATHETERIZATION N/A 11/30/2015   Procedure: Carotid PTA/Stent Intervention;  Surgeon: Serafina Mitchell, MD;  Location: [facility] CV LAB;  Service: Cardiovascular;  Laterality: N/A;  . ROTATOR CUFF REPAIR Right 2009   multiple f/u Sxs/infection Tx  . VASCULAR SURGERY    . VIDEO BRONCHOSCOPY WITH ENDOBRONCHIAL NAVIGATION N/A  05/25/2017   Procedure: VIDEO BRONCHOSCOPY WITH ENDOBRONCHIAL NAVIGATION;  Surgeon: Rigoberto Noel, MD;  Location: [facility];  Service: Thoracic;  Laterality: N/A;  . VIDEO BRONCHOSCOPY WITH ENDOBRONCHIAL NAVIGATION N/A 06/26/2017   Procedure: VIDEO BRONCHOSCOPY WITH ENDOBRONCHIAL NAVIGATION WITH PLACEMENT OF FIDUCIALS;  Surgeon: Grace Isaac, MD;  Location: [facility];  Service: Thoracic;  Laterality: N/A;  . VIDEO BRONCHOSCOPY WITH ENDOBRONCHIAL ULTRASOUND N/A 05/25/2017   Procedure: VIDEO BRONCHOSCOPY WITH ENDOBRONCHIAL ULTRASOUND;  Surgeon: Rigoberto Noel, MD;  Location: [facility] OR;  Service: Thoracic;  Laterality: N/A;    Family History  Problem Relation Age of Onset  . Heart disease Mother        automated implantable cardioverter-defibrillator   . Diabetes type II Mother   . Alcohol abuse Father   . Lung cancer Father   . Lung cancer Maternal Grandmother   . Lung cancer Sister     Social History   Socioeconomic History  . Marital status:  Single    Spouse name: Not on file  . Number of children: 0  . Years of education: Not on file  . Highest education level: Not on file  Social Needs  . Financial resource strain: Not on file  . Food insecurity - worry: Not on file  . Food insecurity - inability: Not on file  . Transportation needs - medical: Not on file  . Transportation needs - non-medical: Not on file  Occupational History  . Occupation: Disabled  Tobacco Use  . Smoking status: Current Every Day Smoker    Packs/day: 1.00    Years: 48.00    Pack years: 48.00    Types: Cigarettes  . Smokeless tobacco: Never Used  . Tobacco comment: 1-2 pks per day  Substance and Sexual Activity  . Alcohol use: Yes    Alcohol/week: 0.6 oz    Types: 1 Cans of beer per week    Comment: rarely  . Drug use: No  . Sexual activity: Yes  Other Topics Concern  . Not on file  Social History Narrative   Divorced with fiance.   Disabled since June 2010 - right shoulder   No children.           Past Medical History, Surgical history, Social history, and Family history were reviewed and updated as appropriate.   Please see review of systems for further details on the patient's review from today.   Review of Systems:  Review of Systems  HENT: Negative for trouble swallowing.   Eyes: Negative for visual disturbance.  Respiratory: Negative for cough, chest tightness and shortness of breath.   Cardiovascular: Negative for chest pain and palpitations.    Objective:   Physical Exam:  There were no vitals taken for this visit.  Physical Exam  Constitutional: No distress.  The patient is an adult male who appears to be in no apparent distress.  HENT:  Head: Normocephalic and atraumatic.  Cardiovascular: Normal rate, regular rhythm and normal heart sounds. Exam reveals no gallop and no friction rub.  No murmur heard. Pulmonary/Chest: Effort normal and breath sounds normal. No respiratory distress. He has no wheezes. He has no rales.  Neurological: He is alert. Coordination normal.  Skin: Skin is warm and dry. He is not diaphoretic.    Lab Review:     Component Value Date/Time   NA 138 09/03/2017 1016   NA 138 08/06/2017 0856   K 4.6 09/03/2017 1016   K 3.6 08/06/2017 0856   CL 105 09/03/2017 1016   CO2 28 09/03/2017 1016   CO2 33 (H) 08/06/2017 0856   GLUCOSE 106 09/03/2017 1016   GLUCOSE 110 08/06/2017 0856   BUN 19 09/03/2017 1016   BUN 16.0 08/06/2017 0856   CREATININE 1.23 09/03/2017 1016   CREATININE 1.3 08/06/2017 0856   CALCIUM 8.7 09/03/2017 1016   CALCIUM 9.1 08/06/2017 0856   PROT 6.5 09/03/2017 1016   PROT 7.1 08/06/2017 0856   ALBUMIN 2.4 (L) 09/03/2017 1016   ALBUMIN 2.2 (L) 08/06/2017 0856   AST 19 09/03/2017 1016   AST 15 08/06/2017 0856   ALT 13 09/03/2017 1016   ALT 9 08/06/2017 0856   ALKPHOS 136 09/03/2017 1016   ALKPHOS 139 08/06/2017 0856   BILITOT <0.2 (L) 09/03/2017 1016   BILITOT 0.35 08/06/2017 0856   GFRNONAA >60 09/03/2017  1016   GFRNONAA 65 01/25/2016 0922   GFRAA >60 09/03/2017 1016   GFRAA 75 01/25/2016 7124  Component Value Date/Time   WBC 2.5 (L) 09/03/2017 1016   RBC 4.28 09/03/2017 1016   HGB 10.0 (L) 09/03/2017 1016   HGB 10.3 (L) 08/06/2017 0856   HCT 31.3 (L) 09/03/2017 1016   HCT 32.1 (L) 08/06/2017 0856   PLT 218 09/03/2017 1016   PLT 272 08/06/2017 0856   MCV 73.1 (L) 09/03/2017 1016   MCV 71.2 (L) 08/06/2017 0856   MCH 23.3 (L) 09/03/2017 1016   MCHC 31.9 (L) 09/03/2017 1016   RDW 20.8 (H) 09/03/2017 1016   RDW 17.3 (H) 08/06/2017 0856   LYMPHSABS 0.5 (L) 09/03/2017 1016   LYMPHSABS 0.7 (L) 08/06/2017 0856   MONOABS 0.2 09/03/2017 1016   MONOABS 0.6 08/06/2017 0856   EOSABS 0.2 09/03/2017 1016   EOSABS 0.0 08/06/2017 0856   BASOSABS 0.0 09/03/2017 1016   BASOSABS 0.1 08/06/2017 0856   -------------------------------  Imaging from last 24 hours (if applicable):  Radiology interpretation: No results found.      This case was discussed with Dr. Julien Nordmann. He expressed agreement with my management of this patient.

## 2017-09-05 ENCOUNTER — Ambulatory Visit: Payer: Medicare Other

## 2017-09-05 ENCOUNTER — Encounter: Payer: Self-pay | Admitting: General Practice

## 2017-09-05 NOTE — Progress Notes (Signed)
[facility] CSW Progress Note  Referral received from [facility] re inconsistent appointments and possible transport issues.  CSW spoke w patient by phone.  States he gets a check and often runs out of funds for gas at the end of the month.  "I think next month itll be better, but its been hard this month."  CSW outlined various transportation options including [employer], [employer] as well as [employer] and [employer] for gas cards.  Patient prefers to drive himself.  Set appt for patient to meet w CSW to complete paperwork for gas card programs on 2/1 after radiation treatment.  Edwyna Shell, LCSW Clinical Social Worker Phone:  [phone]

## 2017-09-06 ENCOUNTER — Ambulatory Visit: Payer: Medicare Other

## 2017-09-07 ENCOUNTER — Ambulatory Visit: Payer: Medicare Other

## 2017-09-07 ENCOUNTER — Telehealth: Payer: Self-pay | Admitting: General Practice

## 2017-09-07 NOTE — Telephone Encounter (Signed)
[facility] CSW Progress Note  CSW attempted to reach patient by phone as patient does not have an appt today as he had thought.  Unable to reach patient to reschedule, no VM.  CSW will ask CSW Dalene Seltzer to work w patient during his appts on Monday 2/5.  Needs help w applications for gas card assistance as patient states that lack of funds creates barrier to coming to treatments.  Does not want [employer] bus or similar service, prefers to drive own car.  Edwyna Shell, LCSW Clinical Social Worker Phone:  [phone]

## 2017-09-10 ENCOUNTER — Ambulatory Visit: Payer: Medicare Other | Admitting: Internal Medicine

## 2017-09-10 ENCOUNTER — Other Ambulatory Visit: Payer: Medicare Other

## 2017-09-10 ENCOUNTER — Ambulatory Visit: Payer: Medicare Other

## 2017-09-10 ENCOUNTER — Telehealth: Payer: Self-pay | Admitting: Medical Oncology

## 2017-09-10 NOTE — Telephone Encounter (Signed)
Pt cancelled appt today and wants to put off treatment for the month of feb due to no money .

## 2017-09-11 ENCOUNTER — Telehealth: Payer: Self-pay | Admitting: *Deleted

## 2017-09-11 ENCOUNTER — Telehealth: Payer: Self-pay | Admitting: General Practice

## 2017-09-11 ENCOUNTER — Ambulatory Visit: Payer: Medicare Other | Admitting: Cardiology

## 2017-09-11 ENCOUNTER — Ambulatory Visit: Payer: Medicare Other | Admitting: Internal Medicine

## 2017-09-11 ENCOUNTER — Encounter: Payer: Self-pay | Admitting: General Practice

## 2017-09-11 ENCOUNTER — Ambulatory Visit: Payer: Medicare Other

## 2017-09-11 DIAGNOSIS — Z0289 Encounter for other administrative examinations: Secondary | ICD-10-CM

## 2017-09-11 NOTE — Telephone Encounter (Signed)
[place] CSW Progress Note  Spoke w patient, states he does not want taxi for tomorrow, 2/6.  Will get here on his own.  Wants transport for 2/7 and 2/8.  CSW cancelled taxi for 2/6.  Edwyna Shell, LCSW Clinical Social Worker Phone:  [phone]

## 2017-09-11 NOTE — Telephone Encounter (Signed)
Oncology Nurse Navigator Documentation  Oncology Nurse Navigator Flowsheets 09/11/2017  Navigator Location [facility]  Navigator Encounter Type Telephone/Mr. Hirsch is missing his appt to treatment. His car is not working. CSW has ben working to help with this.  He continues to miss appts. I called and spoke with him. I listened as he explained his car issues. I gave him other resources to help with transportation. I also updated him that the goal his to help treat cancer and if appts are missed the cancer can grow. He verbalized understanding and will make arrangements for future appts.   Telephone Outgoing Call  Treatment Phase Treatment  Barriers/Navigation Needs Education  Education Transport During Treatment;Other  Interventions Education  Education Method Verbal  Acuity Level 1  Time Spent with Patient 15

## 2017-09-11 NOTE — Progress Notes (Signed)
[place] CSW Progress Notes  CSW called patient, states "my car does not have tags on it, it needs inspection, if it passes inspection then I need to go get the tags, I don't think I can get there until March when I have more money."  CSW discussed other transportation options, including [facility] and [facility].  Explained to patient that he will need to get here via volunteer driver, then meet w CSW to complete SCAT application.  Patient states he is willing to accept ride from [place] [facility] if CSW can arrange quickly.  Then will complete SCAT application, is familiar w SCAT bus and is willing to ride [facility].  CSW will arrange taxi transport (pick up at home at 8:30 on day of treatment) for radiation appointments on 2/6 and 2/7; [place] [facility] will transport to appointments from 2/8 - 2/14.  [facility] transport will be available for appointments from 2/15 onwards.  If needed, patient can be given passes for [facility] transport.  Patient will need to sign SCAT application after his radiation treatment on 2/6.  Patient advised of all the above and agreeable.  Aware that he needs to come to [place] to sign SCAT application after treatment on 2/6.  Edwyna Shell, LCSW Clinical Social Worker Phone:  [phone]

## 2017-09-12 ENCOUNTER — Ambulatory Visit: Payer: Medicare Other

## 2017-09-12 ENCOUNTER — Telehealth: Payer: Self-pay | Admitting: General Practice

## 2017-09-12 NOTE — Telephone Encounter (Signed)
[facility] CSW Progress Note  Spoke w patient, states he did not come to today's appointment because "a man came to look at my car and I couldn't get there."  States he wants to come to tomorrow's 7:45 appt and requests taxi for transport. "I have heard from the man who will take me on Friday, and Ill be there."  [facility] transport arranged for appointments between 2/8 and 2/14.  Patient will need to complete SCAT application when he comes for treatment on 2.7.  Edwyna Shell, LCSW Clinical Social Worker Phone:  [phone]

## 2017-09-12 NOTE — Telephone Encounter (Signed)
[place] CSW Progress Note  Attempted to reach patient to confirm taxi pick up at 7 AM tomorrow as his treatment is scheduled for 7:45 AM.  No answer.  CSW had spoken w patient this morning, stated that taxi will pick him up at home at 7 AM for appt on 2/7.  CSW will have [place] attempt transport for 2/7 appt.     Edwyna Shell, LCSW Clinical Social Worker Phone:  [phone]

## 2017-09-13 ENCOUNTER — Ambulatory Visit
Admission: RE | Admit: 2017-09-13 | Discharge: 2017-09-13 | Disposition: A | Discharge status: Home / Self Care | Payer: Medicare Other | Source: Ambulatory Visit | Attending: Radiation Oncology | Admitting: Radiation Oncology

## 2017-09-13 ENCOUNTER — Ambulatory Visit: Payer: Medicare Other

## 2017-09-13 ENCOUNTER — Telehealth: Payer: Self-pay | Admitting: General Practice

## 2017-09-13 DIAGNOSIS — I6529 Occlusion and stenosis of unspecified carotid artery: Secondary | ICD-10-CM | POA: Diagnosis not present

## 2017-09-13 DIAGNOSIS — M129 Arthropathy, unspecified: Secondary | ICD-10-CM | POA: Diagnosis not present

## 2017-09-13 DIAGNOSIS — Z510 Encounter for antineoplastic radiation therapy: Secondary | ICD-10-CM | POA: Diagnosis not present

## 2017-09-13 DIAGNOSIS — C3412 Malignant neoplasm of upper lobe, left bronchus or lung: Secondary | ICD-10-CM | POA: Diagnosis not present

## 2017-09-13 DIAGNOSIS — D649 Anemia, unspecified: Secondary | ICD-10-CM | POA: Diagnosis not present

## 2017-09-13 DIAGNOSIS — F17210 Nicotine dependence, cigarettes, uncomplicated: Secondary | ICD-10-CM | POA: Diagnosis not present

## 2017-09-13 NOTE — Telephone Encounter (Signed)
[place] CSW  Progress Note  Spoke w patient, he is aware of [place] [facility] pick up tomorrow at 8 AM for 9:15 treatment.  CSW will meet him in radiation waiting room to complete SCAT application and other applicable programs for gas cards which can be distributed once his car is in working order again.  CSW will continue to follow patient.  Will ask CSW Dalene Seltzer to meet w patient during Monday chemo treatment to continue to problem solve issues re adherence to treatment protocol.  Edwyna Shell, LCSW Clinical Social Worker Phone:  [phone]

## 2017-09-14 ENCOUNTER — Ambulatory Visit
Admission: RE | Admit: 2017-09-14 | Discharge: 2017-09-14 | Disposition: A | Discharge status: Home / Self Care | Payer: Medicare Other | Source: Ambulatory Visit | Attending: Radiation Oncology | Admitting: Radiation Oncology

## 2017-09-14 ENCOUNTER — Ambulatory Visit: Payer: Medicare Other

## 2017-09-14 DIAGNOSIS — D649 Anemia, unspecified: Secondary | ICD-10-CM | POA: Diagnosis not present

## 2017-09-14 DIAGNOSIS — F17210 Nicotine dependence, cigarettes, uncomplicated: Secondary | ICD-10-CM | POA: Diagnosis not present

## 2017-09-14 DIAGNOSIS — C3412 Malignant neoplasm of upper lobe, left bronchus or lung: Secondary | ICD-10-CM | POA: Diagnosis not present

## 2017-09-14 DIAGNOSIS — M129 Arthropathy, unspecified: Secondary | ICD-10-CM | POA: Diagnosis not present

## 2017-09-14 DIAGNOSIS — Z510 Encounter for antineoplastic radiation therapy: Secondary | ICD-10-CM | POA: Diagnosis not present

## 2017-09-14 DIAGNOSIS — I6529 Occlusion and stenosis of unspecified carotid artery: Secondary | ICD-10-CM | POA: Diagnosis not present

## 2017-09-17 ENCOUNTER — Ambulatory Visit
Admission: RE | Admit: 2017-09-17 | Discharge: 2017-09-17 | Disposition: A | Discharge status: Home / Self Care | Payer: Medicare Other | Source: Ambulatory Visit | Attending: Radiation Oncology | Admitting: Radiation Oncology

## 2017-09-17 ENCOUNTER — Ambulatory Visit: Payer: Medicare Other

## 2017-09-17 ENCOUNTER — Inpatient Hospital Stay: Payer: Medicare Other

## 2017-09-17 ENCOUNTER — Inpatient Hospital Stay: Payer: Medicare Other | Attending: Internal Medicine

## 2017-09-17 ENCOUNTER — Encounter: Payer: Self-pay | Admitting: *Deleted

## 2017-09-17 ENCOUNTER — Ambulatory Visit (HOSPITAL_BASED_OUTPATIENT_CLINIC_OR_DEPARTMENT_OTHER): Payer: Medicare Other | Admitting: Medical

## 2017-09-17 ENCOUNTER — Telehealth: Payer: Self-pay | Admitting: *Deleted

## 2017-09-17 ENCOUNTER — Other Ambulatory Visit: Payer: Self-pay | Admitting: Medical

## 2017-09-17 VITALS — BP 189/84 | HR 68 | Temp 97.8°F | Resp 18

## 2017-09-17 DIAGNOSIS — C3412 Malignant neoplasm of upper lobe, left bronchus or lung: Secondary | ICD-10-CM | POA: Diagnosis not present

## 2017-09-17 DIAGNOSIS — Z510 Encounter for antineoplastic radiation therapy: Secondary | ICD-10-CM | POA: Diagnosis not present

## 2017-09-17 DIAGNOSIS — F17210 Nicotine dependence, cigarettes, uncomplicated: Secondary | ICD-10-CM | POA: Diagnosis not present

## 2017-09-17 DIAGNOSIS — C3492 Malignant neoplasm of unspecified part of left bronchus or lung: Secondary | ICD-10-CM

## 2017-09-17 DIAGNOSIS — Z5111 Encounter for antineoplastic chemotherapy: Secondary | ICD-10-CM | POA: Insufficient documentation

## 2017-09-17 DIAGNOSIS — I10 Essential (primary) hypertension: Secondary | ICD-10-CM

## 2017-09-17 DIAGNOSIS — D649 Anemia, unspecified: Secondary | ICD-10-CM | POA: Diagnosis not present

## 2017-09-17 DIAGNOSIS — M129 Arthropathy, unspecified: Secondary | ICD-10-CM | POA: Diagnosis not present

## 2017-09-17 DIAGNOSIS — I6529 Occlusion and stenosis of unspecified carotid artery: Secondary | ICD-10-CM | POA: Diagnosis not present

## 2017-09-17 LAB — COMPREHENSIVE METABOLIC PANEL
ALT: 8 U/L (ref 0–55)
AST: 17 U/L (ref 5–34)
Albumin: 2.4 g/dL — ABNORMAL LOW (ref 3.5–5.0)
Alkaline Phosphatase: 135 U/L (ref 40–150)
Anion gap: 8 (ref 3–11)
BUN: 12 mg/dL (ref 7–26)
CO2: 25 mmol/L (ref 22–29)
Calcium: 8.5 mg/dL (ref 8.4–10.4)
Chloride: 104 mmol/L (ref 98–109)
Creatinine, Ser: 0.99 mg/dL (ref 0.70–1.30)
GFR calc Af Amer: >60 mL/min (ref >60)
GFR calc non Af Amer: >60 mL/min (ref >60)
Glucose, Bld: 93 mg/dL (ref 70–140)
Potassium: 3.2 mmol/L — ABNORMAL LOW (ref 3.5–5.1)
Sodium: 137 mmol/L (ref 136–145)
Total Bilirubin: 0.3 mg/dL (ref 0.2–1.2)
Total Protein: 6.5 g/dL (ref 6.4–8.3)

## 2017-09-17 LAB — CBC WITH DIFFERENTIAL/PLATELET
Basophils Absolute: 0.1 10*3/uL (ref 0.0–0.1)
Basophils Relative: 2 %
Eosinophils Absolute: 0.1 10*3/uL (ref 0.0–0.5)
Eosinophils Relative: 2 %
HCT: 33.6 % — ABNORMAL LOW (ref 38.4–49.9)
Hemoglobin: 10.9 g/dL — ABNORMAL LOW (ref 13.0–17.1)
Lymphocytes Relative: 13 %
Lymphs Abs: 0.5 10*3/uL — ABNORMAL LOW (ref 0.9–3.3)
MCH: 24 pg — ABNORMAL LOW (ref 27.2–33.4)
MCHC: 32.6 g/dL (ref 32.0–36.0)
MCV: 73.6 fL — ABNORMAL LOW (ref 79.3–98.0)
Monocytes Absolute: 0.6 10*3/uL (ref 0.1–0.9)
Monocytes Relative: 14 %
Neutro Abs: 2.8 10*3/uL (ref 1.5–6.5)
Neutrophils Relative %: 69 %
Platelets: 183 10*3/uL (ref 140–400)
RBC: 4.57 MIL/uL (ref 4.20–5.82)
RDW: 22.5 % — ABNORMAL HIGH (ref 11.0–14.6)
WBC: 4.1 10*3/uL (ref 4.0–10.3)

## 2017-09-17 MED ORDER — DIPHENHYDRAMINE HCL 50 MG/ML IJ SOLN
50.0000 mg | Freq: Once | INTRAMUSCULAR | Status: AC
  Administered 2017-09-17: 50 mg via INTRAVENOUS

## 2017-09-17 MED ORDER — PALONOSETRON HCL INJECTION 0.25 MG/5ML
INTRAVENOUS | Status: AC
  Filled 2017-09-17: qty 5

## 2017-09-17 MED ORDER — CLONIDINE HCL 0.1 MG PO TABS: 0.3000 mg | ORAL_TABLET | Freq: Every day | ORAL | Status: DC

## 2017-09-17 MED ORDER — DIPHENHYDRAMINE HCL 50 MG/ML IJ SOLN
INTRAMUSCULAR | Status: AC
  Filled 2017-09-17: qty 1

## 2017-09-17 MED ORDER — CLONIDINE HCL 0.1 MG PO TABS
0.3000 mg | ORAL_TABLET | Freq: Once | ORAL | Status: DC
  Administered 2017-09-17: 0.3 mg via ORAL

## 2017-09-17 MED ORDER — SODIUM CHLORIDE 0.9 % IV SOLN
238.2000 mg | Freq: Once | INTRAVENOUS | Status: AC
  Administered 2017-09-17: 240 mg via INTRAVENOUS
  Filled 2017-09-17: qty 24

## 2017-09-17 MED ORDER — SODIUM CHLORIDE 0.9 % IV SOLN
20.0000 mg | Freq: Once | INTRAVENOUS | Status: AC
  Administered 2017-09-17: 20 mg via INTRAVENOUS
  Filled 2017-09-17: qty 2

## 2017-09-17 MED ORDER — SODIUM CHLORIDE 0.9 % IV SOLN
Freq: Once | INTRAVENOUS | Status: AC
  Administered 2017-09-17: 13:00:00 via INTRAVENOUS

## 2017-09-17 MED ORDER — FAMOTIDINE IN NACL 20-0.9 MG/50ML-% IV SOLN: 20.0000 mg | Freq: Once | INTRAVENOUS | Status: DC

## 2017-09-17 MED ORDER — CLONIDINE HCL 0.1 MG PO TABS
ORAL_TABLET | ORAL | Status: AC
  Filled 2017-09-17: qty 3

## 2017-09-17 MED ORDER — PALONOSETRON HCL INJECTION 0.25 MG/5ML
0.2500 mg | Freq: Once | INTRAVENOUS | Status: AC
  Administered 2017-09-17: 0.25 mg via INTRAVENOUS

## 2017-09-17 MED ORDER — SODIUM CHLORIDE 0.9 % IV SOLN
45.0000 mg/m2 | Freq: Once | INTRAVENOUS | Status: AC
  Administered 2017-09-17: 102 mg via INTRAVENOUS
  Filled 2017-09-17: qty 17

## 2017-09-17 NOTE — Patient Instructions (Signed)
[facility] Discharge Instructions for Patients Receiving Chemotherapy  Today you received the following chemotherapy agents taxol/carboplatin  To help prevent nausea and vomiting after your treatment, we encourage you to take your nausea medication as directed   If you develop nausea and vomiting that is not controlled by your nausea medication, call the clinic.   BELOW ARE SYMPTOMS THAT SHOULD BE REPORTED IMMEDIATELY:  *FEVER GREATER THAN 100.5 F  *CHILLS WITH OR WITHOUT FEVER  NAUSEA AND VOMITING THAT IS NOT CONTROLLED WITH YOUR NAUSEA MEDICATION  *UNUSUAL SHORTNESS OF BREATH  *UNUSUAL BRUISING OR BLEEDING  TENDERNESS IN MOUTH AND THROAT WITH OR WITHOUT PRESENCE OF ULCERS  *URINARY PROBLEMS  *BOWEL PROBLEMS  UNUSUAL RASH Items with * indicate a potential emergency and should be followed up as soon as possible.  Feel free to call the clinic you have any questions or concerns. The clinic phone number is [phone].  

## 2017-09-17 NOTE — Progress Notes (Signed)
[place] Work  Holiday representative met with patient in [place] office at [facility] to offer support and resources for transportation.  CSW and patient reviewed applications for the [employer] (gas card), Lung cancer initiative gas card program and [facility].  All applications were completed and submitted, and patietn was provided a gas card through the [facility].  CSW also encouraged patient to bring income information to treatment tomorrow and apply for the [place] with financial advocate.  CSW will forward information to financial advocate.  CSW provided contact information and encouraged patient to call with questions or concerns.    Johnnye Lana, MSW, LCSW, OSW-C Clinical Social Worker [facility] [phone]

## 2017-09-17 NOTE — Progress Notes (Signed)
Per Lucianne Lei PA, ok to treat with b/p 189/84

## 2017-09-17 NOTE — Progress Notes (Signed)
[facility]pt[facility] Management Clinic Progress Note   Brad [name] [id number] [birth year] 62 y.o.  Brad [name] is managed by Dr. Eilleen Kempf  Actively treated with chemotherapy: yes  Current Therapy: Carboplatin and paclitaxel  Last Treated:  08/27/2017 (cycle 3, day 1)  Assessment: Plan:    Essential hypertension  Stage III squamous cell carcinoma of left lung ([place])   Essential hypertension with poor control: Patient blood pressure returned at 218/74 today when presenting for chemotherapy.  He was given clonidine 0.3 mg p.o. X1.  His blood pressure returned at 189/84 on recheck of his blood pressure 30 minutes after dosing with clonidine.  The patient was counseled and told that it was important to take his lisinopril 20 mg once daily, amlodipine 5 mg once daily, metolazone 5 mg once daily, and Lasix 80 mg daily as prescribed by his primary care provider.  Stage III squamous cell carcinoma of the left lung: The patient presented for cycle 4-day 1 of chemotherapy today.  The patient was approved for treatment today after receiving clonidine 0.3 mg p.o. X1.   Please see After Visit Summary for patient specific instructions.  Future Appointments  Date Time Provider [place]  09/18/2017  9:15 AM [facility] [id number] [facility] None  09/19/2017  9:15 AM [facility] [id number] [facility] None  09/20/2017  9:15 AM [facility] [id number] [facility] None  09/21/2017  7:30 AM [facility] [id number] [facility] None  09/24/2017  7:45 AM [facility] [id number] [facility] None  09/24/2017  9:45 AM [facility] LAB 2 [facility] None  09/24/2017 10:00 AM Curt Bears, MD [facility] None  09/25/2017  9:15 AM [facility] [id number] [facility] None  09/26/2017  7:45 AM [facility] [id number] [facility] None  09/27/2017  7:45 AM [facility] [id number] [facility] None  09/28/2017  7:45 AM [facility] [id number] [facility] None  10/01/2017  7:45 AM [facility] [id number] [facility] None   10/02/2017  7:45 AM [facility] [id number] [facility] None  10/15/2017 10:20 AM Skains, Thana Farr, MD [facility]    No orders of the defined types were placed in this encounter.      Subjective:   Patient ID:  Brad [name] is a 62 y.o. (DOB January 13, 1956) male.  Chief Complaint: No chief complaint on file.   HPI [name] presented to the infusion room today for cycle 4-day 1 of carboplatin and paclitaxel.  I was asked to see the patient after his blood pressure returned at 218/74.  He is on lisinopril 20 mg once daily, amlodipine 5 mg once daily, metolazone 5 mg once daily, and Lasix 80 mg daily.  He did not take any of his blood pressure medications prior to presenting today.  This also happened on 09/03/2017 when he was scheduled for chemotherapy.  He had not taken any of his blood pressure medicines prior to presenting and was found to have a blood pressure of 235/101.  He was given 0.2 mg of clonidine at that time with his blood pressure only dropping to 224/88.  He was transferred to the emergency room for me where he was given doses of his regularly scheduled medications and observed.  His blood pressure eventually improved and he was discharged to home.  At that time he reported eating foods that were higher in sodium prior to coming for his appointment.  He states that he has not taken any of his blood pressure medicines this morning.  He reports that he has not eaten breakfast.  He denies chest pressure, chest pain, shortness of breath, or diaphoresis.  Medications:  I have reviewed the patient's current medications.  Allergies:  Allergies  Allergen Reactions  . Doxycycline Hives and Itching  . Lipitor [Atorvastatin] Other (See Comments)    Myalgia   . Oxycodone Itching    Past Medical History:  Diagnosis Date  . Anemia    takes Ferrous Sulfated daily  . Arthritis   . Carotid stenosis 08/27/2014   takes Aspirin daily  . Chronic hepatitis C ([place])  09/17/2015  . COPD (chronic obstructive pulmonary disease) ([facility])    Albuterol daily as needed and Spiriva daily  . Depression   . Diabetes mellitus type II    takes Metformin and Glipizide daily  . Dizziness   . GERD (gastroesophageal reflux disease)   . GI bleed 12/2015  . Headache    occasionally  . History of blood clots    in right leg;being followed by Dr.Brabham for this  . History of blood transfusion 09/2014   2 units-no abnormal reaction noted  . History of gastric ulcer   . History of hiatal hernia   . HTN (hypertension) 09/14/2015  . Hyperlipidemia 08/27/2014   takes Simvastatin daily  . Hypertension    takes Amlodipine and Lisinopril daily  . Hypothyroidism 08/27/2014   takes Synthroid daily  . Insomnia    takes Pamelor nightly  . Joint pain    hips  . Muscle spasm    takes Zanaflex daily as needed  . MVP (mitral valve prolapse)   . Peripheral edema    takes Furosemide daily as needed  . Peripheral neuropathy   . Prurigo nodularis   . Shortness of breath dyspnea   . Stroke ([place])   . Urinary frequency   . Urinary urgency     Past Surgical History:  Procedure Laterality Date  . ENDARTERECTOMY Left 11/13/2014   Procedure: ENDARTERECTOMY CAROTID WITH PATCH ANGIOPLASTY;  Surgeon: Serafina Mitchell, MD;  Location: [facility] OR;  Service: Vascular;  Laterality: Left;  . ESOPHAGOGASTRODUODENOSCOPY N/A 09/24/2014   Procedure: ESOPHAGOGASTRODUODENOSCOPY (EGD);  Surgeon: Winfield Cunas., MD;  Location: [facility] ENDOSCOPY;  Service: Endoscopy;  Laterality: N/A;  . FUDUCIAL PLACEMENT N/A 06/26/2017   Procedure: PLACEMENT OF FUDUCIAL;  Surgeon: Grace Isaac, MD;  Location: [place];  Service: Thoracic;  Laterality: N/A;  . I&D of right arm    . INCISION AND DRAINAGE PERIRECTAL ABSCESS  03/2009  . Neg Stress Test  2000  . PERIPHERAL VASCULAR CATHETERIZATION N/A 11/30/2015   Procedure: Carotid PTA/Stent Intervention;  Surgeon: Serafina Mitchell, MD;  Location: [facility] CV LAB;   Service: Cardiovascular;  Laterality: N/A;  . ROTATOR CUFF REPAIR Right 2009   multiple f/u Sxs/infection Tx  . VASCULAR SURGERY    . VIDEO BRONCHOSCOPY WITH ENDOBRONCHIAL NAVIGATION N/A 05/25/2017   Procedure: VIDEO BRONCHOSCOPY WITH ENDOBRONCHIAL NAVIGATION;  Surgeon: Rigoberto Noel, MD;  Location: [facility];  Service: Thoracic;  Laterality: N/A;  . VIDEO BRONCHOSCOPY WITH ENDOBRONCHIAL NAVIGATION N/A 06/26/2017   Procedure: VIDEO BRONCHOSCOPY WITH ENDOBRONCHIAL NAVIGATION WITH PLACEMENT OF FIDUCIALS;  Surgeon: Grace Isaac, MD;  Location: [facility];  Service: Thoracic;  Laterality: N/A;  . VIDEO BRONCHOSCOPY WITH ENDOBRONCHIAL ULTRASOUND N/A 05/25/2017   Procedure: VIDEO BRONCHOSCOPY WITH ENDOBRONCHIAL ULTRASOUND;  Surgeon: Rigoberto Noel, MD;  Location: [facility] OR;  Service: Thoracic;  Laterality: N/A;    Family History  Problem Relation Age of Onset  . Heart disease Mother        automated implantable cardioverter-defibrillator   . Diabetes type II Mother   .  Alcohol abuse Father   . Lung cancer Father   . Lung cancer Maternal Grandmother   . Lung cancer Sister     Social History   Socioeconomic History  . Marital status: Single    Spouse name: Not on file  . Number of children: 0  . Years of education: Not on file  . Highest education level: Not on file  Social Needs  . Financial resource strain: Not on file  . Food insecurity - worry: Not on file  . Food insecurity - inability: Not on file  . Transportation needs - medical: Not on file  . Transportation needs - non-medical: Not on file  Occupational History  . Occupation: Disabled  Tobacco Use  . Smoking status: Current Every Day Smoker    Packs/day: 1.00    Years: 48.00    Pack years: 48.00    Types: Cigarettes  . Smokeless tobacco: Never Used  . Tobacco comment: 1-2 pks per day  Substance and Sexual Activity  . Alcohol use: Yes    Alcohol/week: 0.6 oz    Types: 1 Cans of beer per week    Comment: rarely  . Drug  use: No  . Sexual activity: Yes  Other Topics Concern  . Not on file  Social History Narrative   Divorced with fiance.   Disabled since June 2010 - right shoulder   No children.          Past Medical History, Surgical history, Social history, and Family history were reviewed and updated as appropriate.   Please see review of systems for further details on the patient's review from today.   Review of Systems:  Review of Systems  Constitutional: Negative for chills, diaphoresis and fatigue.  HENT: Negative for trouble swallowing.   Respiratory: Negative for cough, choking, chest tightness, shortness of breath and wheezing.   Cardiovascular: Negative for chest pain and palpitations.    Objective:   Physical Exam:  There were no vitals taken for this visit.   Physical Exam  Constitutional: No distress.  HENT:  Head: Normocephalic and atraumatic.  Neurological: He is alert.  Skin: He is not diaphoretic.    Lab Review:     Component Value Date/Time   NA 137 09/17/2017 1036   NA 138 08/06/2017 0856   K 3.2 (L) 09/17/2017 1036   K 3.6 08/06/2017 0856   CL 104 09/17/2017 1036   CO2 25 09/17/2017 1036   CO2 33 (H) 08/06/2017 0856   GLUCOSE 93 09/17/2017 1036   GLUCOSE 110 08/06/2017 0856   BUN 12 09/17/2017 1036   BUN 16.0 08/06/2017 0856   CREATININE 0.99 09/17/2017 1036   CREATININE 1.3 08/06/2017 0856   CALCIUM 8.5 09/17/2017 1036   CALCIUM 9.1 08/06/2017 0856   PROT 6.5 09/17/2017 1036   PROT 7.1 08/06/2017 0856   ALBUMIN 2.4 (L) 09/17/2017 1036   ALBUMIN 2.2 (L) 08/06/2017 0856   AST 17 09/17/2017 1036   AST 15 08/06/2017 0856   ALT 8 09/17/2017 1036   ALT 9 08/06/2017 0856   ALKPHOS 135 09/17/2017 1036   ALKPHOS 139 08/06/2017 0856   BILITOT 0.3 09/17/2017 1036   BILITOT 0.35 08/06/2017 0856   GFRNONAA >60 09/17/2017 1036   GFRNONAA 65 01/25/2016 0922   GFRAA >60 09/17/2017 1036   GFRAA 75 01/25/2016 0922       Component Value Date/Time   WBC 4.1  09/17/2017 1036   RBC 4.57 09/17/2017 1036   HGB 10.9 (L) 09/17/2017  1036   HGB 10.3 (L) 08/06/2017 0856   HCT 33.6 (L) 09/17/2017 1036   HCT 32.1 (L) 08/06/2017 0856   PLT 183 09/17/2017 1036   PLT 272 08/06/2017 0856   MCV 73.6 (L) 09/17/2017 1036   MCV 71.2 (L) 08/06/2017 0856   MCH 24.0 (L) 09/17/2017 1036   MCHC 32.6 09/17/2017 1036   RDW 22.5 (H) 09/17/2017 1036   RDW 17.3 (H) 08/06/2017 0856   LYMPHSABS 0.5 (L) 09/17/2017 1036   LYMPHSABS 0.7 (L) 08/06/2017 0856   MONOABS 0.6 09/17/2017 1036   MONOABS 0.6 08/06/2017 0856   EOSABS 0.1 09/17/2017 1036   EOSABS 0.0 08/06/2017 0856   BASOSABS 0.1 09/17/2017 1036   BASOSABS 0.1 08/06/2017 0856   -------------------------------  Imaging from last 24 hours (if applicable):  Radiology interpretation: No results found.      This case was discussed with Dr. Julien Nordmann. He expressed agreement with my management of this patient.

## 2017-09-17 NOTE — Telephone Encounter (Signed)
-----   Message from Curt Bears, MD sent at 09/17/2017  1:17 PM EST ----- Please encourage K rich diet. ----- Message ----- From: Interface, Lab In [facility] Sent: 09/17/2017  11:04 AM To: Curt Bears, MD

## 2017-09-17 NOTE — Progress Notes (Signed)
MD requested to cont Carbo at 240 mg, rather than increase to 300 mg. AUC = 1.6 today. Kennith Center, Pharm.D., CPP 09/17/2017@12 :55 PM

## 2017-09-17 NOTE — Telephone Encounter (Signed)
Called pt with results.

## 2017-09-18 ENCOUNTER — Ambulatory Visit
Admission: RE | Admit: 2017-09-18 | Discharge: 2017-09-18 | Disposition: A | Discharge status: Home / Self Care | Payer: Medicare Other | Source: Ambulatory Visit | Attending: Radiation Oncology | Admitting: Radiation Oncology

## 2017-09-18 DIAGNOSIS — D649 Anemia, unspecified: Secondary | ICD-10-CM | POA: Diagnosis not present

## 2017-09-18 DIAGNOSIS — M129 Arthropathy, unspecified: Secondary | ICD-10-CM | POA: Diagnosis not present

## 2017-09-18 DIAGNOSIS — Z510 Encounter for antineoplastic radiation therapy: Secondary | ICD-10-CM | POA: Diagnosis not present

## 2017-09-18 DIAGNOSIS — F17210 Nicotine dependence, cigarettes, uncomplicated: Secondary | ICD-10-CM | POA: Diagnosis not present

## 2017-09-18 DIAGNOSIS — I6529 Occlusion and stenosis of unspecified carotid artery: Secondary | ICD-10-CM | POA: Diagnosis not present

## 2017-09-18 DIAGNOSIS — C3412 Malignant neoplasm of upper lobe, left bronchus or lung: Secondary | ICD-10-CM | POA: Diagnosis not present

## 2017-09-19 ENCOUNTER — Telehealth: Payer: Self-pay | Admitting: General Practice

## 2017-09-19 ENCOUNTER — Ambulatory Visit: Payer: Medicare Other

## 2017-09-19 NOTE — Telephone Encounter (Signed)
[place] CSW Progress Notes  Received notification that patient had missed appointment this morning, attempted to contact patient to problem solve.  No answer and no VM set up.  [place] [facility] to determine if transportation was an issue this morning - states "today's ride was cancelled", there is a ride Counsellor on the books for 2/14.  CSW requested an additional [facility] transport on 2/15 - this ride will be provided by taxi arranged by [facility], will pick up patient at home at 7 AM.  Agency will contact patient to confirm.  CSW is also attempting to confirm patient's ability to access [facility] transportation.  Edwyna Shell, LCSW Clinical Social Worker Phone:  [phone]

## 2017-09-19 NOTE — Telephone Encounter (Signed)
Brad [name] CSW Progress Note  CSW spoke w patient re missed appointment today.  States "I went outside and my car would not crank but Ill be there in the morning."  Intends to drive himself to upcoming appointments.  Was asked to cancel future [facility] trips if he plans to drive himself, agreed to do so.    Edwyna Shell, LCSW Clinical Social Worker Phone:  [phone]

## 2017-09-20 ENCOUNTER — Ambulatory Visit
Admission: RE | Admit: 2017-09-20 | Discharge: 2017-09-20 | Disposition: A | Discharge status: Home / Self Care | Payer: Medicare Other | Source: Ambulatory Visit | Attending: Radiation Oncology | Admitting: Radiation Oncology

## 2017-09-20 DIAGNOSIS — Z510 Encounter for antineoplastic radiation therapy: Secondary | ICD-10-CM | POA: Diagnosis not present

## 2017-09-20 DIAGNOSIS — M129 Arthropathy, unspecified: Secondary | ICD-10-CM | POA: Diagnosis not present

## 2017-09-20 DIAGNOSIS — D649 Anemia, unspecified: Secondary | ICD-10-CM | POA: Diagnosis not present

## 2017-09-20 DIAGNOSIS — C3412 Malignant neoplasm of upper lobe, left bronchus or lung: Secondary | ICD-10-CM | POA: Diagnosis not present

## 2017-09-20 DIAGNOSIS — F17210 Nicotine dependence, cigarettes, uncomplicated: Secondary | ICD-10-CM | POA: Diagnosis not present

## 2017-09-20 DIAGNOSIS — I6529 Occlusion and stenosis of unspecified carotid artery: Secondary | ICD-10-CM | POA: Diagnosis not present

## 2017-09-21 ENCOUNTER — Ambulatory Visit
Admission: RE | Admit: 2017-09-21 | Discharge: 2017-09-21 | Disposition: A | Discharge status: Home / Self Care | Payer: Medicare Other | Source: Ambulatory Visit | Attending: Radiation Oncology | Admitting: Radiation Oncology

## 2017-09-21 ENCOUNTER — Ambulatory Visit: Payer: Medicare Other

## 2017-09-21 DIAGNOSIS — F17210 Nicotine dependence, cigarettes, uncomplicated: Secondary | ICD-10-CM | POA: Diagnosis not present

## 2017-09-21 DIAGNOSIS — M129 Arthropathy, unspecified: Secondary | ICD-10-CM | POA: Diagnosis not present

## 2017-09-21 DIAGNOSIS — C3412 Malignant neoplasm of upper lobe, left bronchus or lung: Secondary | ICD-10-CM | POA: Diagnosis not present

## 2017-09-21 DIAGNOSIS — I6529 Occlusion and stenosis of unspecified carotid artery: Secondary | ICD-10-CM | POA: Diagnosis not present

## 2017-09-21 DIAGNOSIS — Z510 Encounter for antineoplastic radiation therapy: Secondary | ICD-10-CM | POA: Diagnosis not present

## 2017-09-21 DIAGNOSIS — D649 Anemia, unspecified: Secondary | ICD-10-CM | POA: Diagnosis not present

## 2017-09-24 ENCOUNTER — Inpatient Hospital Stay: Payer: Medicare Other

## 2017-09-24 ENCOUNTER — Inpatient Hospital Stay (HOSPITAL_BASED_OUTPATIENT_CLINIC_OR_DEPARTMENT_OTHER): Payer: Medicare Other | Admitting: Internal Medicine

## 2017-09-24 ENCOUNTER — Encounter: Payer: Self-pay | Admitting: Internal Medicine

## 2017-09-24 ENCOUNTER — Ambulatory Visit
Admission: RE | Admit: 2017-09-24 | Discharge: 2017-09-24 | Disposition: A | Discharge status: Home / Self Care | Payer: Medicare Other | Source: Ambulatory Visit | Attending: Radiation Oncology | Admitting: Radiation Oncology

## 2017-09-24 ENCOUNTER — Telehealth: Payer: Self-pay

## 2017-09-24 VITALS — BP 166/78 | HR 71 | Temp 97.7°F | Ht 71.0 in | Wt 233.2 lb

## 2017-09-24 DIAGNOSIS — C3492 Malignant neoplasm of unspecified part of left bronchus or lung: Secondary | ICD-10-CM

## 2017-09-24 DIAGNOSIS — I10 Essential (primary) hypertension: Secondary | ICD-10-CM

## 2017-09-24 DIAGNOSIS — I6529 Occlusion and stenosis of unspecified carotid artery: Secondary | ICD-10-CM | POA: Diagnosis not present

## 2017-09-24 DIAGNOSIS — M129 Arthropathy, unspecified: Secondary | ICD-10-CM | POA: Diagnosis not present

## 2017-09-24 DIAGNOSIS — Z5111 Encounter for antineoplastic chemotherapy: Secondary | ICD-10-CM | POA: Diagnosis not present

## 2017-09-24 DIAGNOSIS — E119 Type 2 diabetes mellitus without complications: Secondary | ICD-10-CM

## 2017-09-24 DIAGNOSIS — C3412 Malignant neoplasm of upper lobe, left bronchus or lung: Secondary | ICD-10-CM | POA: Diagnosis not present

## 2017-09-24 DIAGNOSIS — D649 Anemia, unspecified: Secondary | ICD-10-CM | POA: Diagnosis not present

## 2017-09-24 DIAGNOSIS — F17210 Nicotine dependence, cigarettes, uncomplicated: Secondary | ICD-10-CM | POA: Diagnosis not present

## 2017-09-24 DIAGNOSIS — Z510 Encounter for antineoplastic radiation therapy: Secondary | ICD-10-CM | POA: Diagnosis not present

## 2017-09-24 LAB — COMPREHENSIVE METABOLIC PANEL
ALT: 9 U/L (ref 0–55)
AST: 20 U/L (ref 5–34)
Albumin: 2.5 g/dL — ABNORMAL LOW (ref 3.5–5.0)
Alkaline Phosphatase: 119 U/L (ref 40–150)
Anion gap: 8 (ref 3–11)
BUN: 17 mg/dL (ref 7–26)
CO2: 29 mmol/L (ref 22–29)
Calcium: 8.9 mg/dL (ref 8.4–10.4)
Chloride: 102 mmol/L (ref 98–109)
Creatinine, Ser: 1.2 mg/dL (ref 0.70–1.30)
GFR calc Af Amer: >60 mL/min (ref >60)
GFR calc non Af Amer: >60 mL/min (ref >60)
Glucose, Bld: 110 mg/dL (ref 70–140)
Potassium: 3.5 mmol/L (ref 3.5–5.1)
Sodium: 139 mmol/L (ref 136–145)
Total Bilirubin: 0.4 mg/dL (ref 0.2–1.2)
Total Protein: 6.5 g/dL (ref 6.4–8.3)

## 2017-09-24 LAB — CBC WITH DIFFERENTIAL/PLATELET
Basophils Absolute: 0.1 10*3/uL (ref 0.0–0.1)
Basophils Relative: 2 %
Eosinophils Absolute: 0.1 10*3/uL (ref 0.0–0.5)
Eosinophils Relative: 3 %
HCT: 33.4 % — ABNORMAL LOW (ref 38.4–49.9)
Hemoglobin: 10.8 g/dL — ABNORMAL LOW (ref 13.0–17.1)
Lymphocytes Relative: 14 %
Lymphs Abs: 0.5 10*3/uL — ABNORMAL LOW (ref 0.9–3.3)
MCH: 24 pg — ABNORMAL LOW (ref 27.2–33.4)
MCHC: 32.3 g/dL (ref 32.0–36.0)
MCV: 74.4 fL — ABNORMAL LOW (ref 79.3–98.0)
Monocytes Absolute: 0.4 10*3/uL (ref 0.1–0.9)
Monocytes Relative: 12 %
Neutro Abs: 2.5 10*3/uL (ref 1.5–6.5)
Neutrophils Relative %: 69 %
Platelets: 179 10*3/uL (ref 140–400)
RBC: 4.5 MIL/uL (ref 4.20–5.82)
RDW: 22.1 % — ABNORMAL HIGH (ref 11.0–14.6)
WBC: 3.6 10*3/uL — ABNORMAL LOW (ref 4.0–10.3)

## 2017-09-24 MED ORDER — SODIUM CHLORIDE 0.9 % IV SOLN
20.0000 mg | Freq: Once | INTRAVENOUS | Status: AC
  Administered 2017-09-24: 20 mg via INTRAVENOUS
  Filled 2017-09-24: qty 2

## 2017-09-24 MED ORDER — PALONOSETRON HCL INJECTION 0.25 MG/5ML
INTRAVENOUS | Status: AC
  Filled 2017-09-24: qty 5

## 2017-09-24 MED ORDER — SODIUM CHLORIDE 0.9 % IV SOLN
Freq: Once | INTRAVENOUS | Status: AC
  Administered 2017-09-24: 13:00:00 via INTRAVENOUS

## 2017-09-24 MED ORDER — PALONOSETRON HCL INJECTION 0.25 MG/5ML
0.2500 mg | Freq: Once | INTRAVENOUS | Status: AC
  Administered 2017-09-24: 0.25 mg via INTRAVENOUS

## 2017-09-24 MED ORDER — DIPHENHYDRAMINE HCL 50 MG/ML IJ SOLN
50.0000 mg | Freq: Once | INTRAMUSCULAR | Status: AC
  Administered 2017-09-24: 50 mg via INTRAVENOUS

## 2017-09-24 MED ORDER — DIPHENHYDRAMINE HCL 50 MG/ML IJ SOLN
INTRAMUSCULAR | Status: AC
Start: 2017-09-24 — End: ?
  Filled 2017-09-24: qty 1

## 2017-09-24 MED ORDER — FAMOTIDINE IN NACL 20-0.9 MG/50ML-% IV SOLN: 20.0000 mg | Freq: Once | INTRAVENOUS | Status: DC

## 2017-09-24 MED ORDER — SODIUM CHLORIDE 0.9 % IV SOLN
238.2000 mg | Freq: Once | INTRAVENOUS | Status: AC
  Administered 2017-09-24: 240 mg via INTRAVENOUS
  Filled 2017-09-24: qty 24

## 2017-09-24 MED ORDER — PACLITAXEL CHEMO INJECTION 300 MG/50ML
45.0000 mg/m2 | Freq: Once | INTRAVENOUS | Status: AC
  Administered 2017-09-24: 102 mg via INTRAVENOUS
  Filled 2017-09-24: qty 17

## 2017-09-24 NOTE — Progress Notes (Signed)
Brad [name] [phone]   [phone]) [phone]  OFFICE PROGRESS NOTE  Biagio Borg, MD [place] [address]  DIAGNOSIS: IIIA (T1b, N2, M0)non-small cell lung cancer, squamous cell carcinoma presented with left upper lobe lung nodule as well as suspicious left prevascular lymph node diagnosed in November 2018  PRIOR THERAPY: None.  CURRENT THERAPY: Concurrent chemoradiation with weekly carboplatin for an AUC of 2 and paclitaxel 45 mg/m.  First dose given 07/23/2017.  Status post 5 cycles.  INTERVAL HISTORY: Brad [name] 62 y.o. male returns to the clinic today for follow-up visit.  The patient is feeling fine today with no specific complaints.  He continues to tolerate this course of concurrent chemoradiation fairly well.  He denied having any chest pain, shortness breath, cough or hemoptysis.  He denied having any weight loss or night sweats.  He has no nausea, vomiting, diarrhea or constipation.  His blood pressure is always on the high side.  He took his blood pressure medication earlier today.  He is here today for evaluation before starting cycle #6.  MEDICAL HISTORY: Past Medical History:  Diagnosis Date  . Anemia    takes Ferrous Sulfated daily  . Arthritis   . Carotid stenosis 08/27/2014   takes Aspirin daily  . Chronic hepatitis C ([place]) 09/17/2015  . COPD (chronic obstructive pulmonary disease) (HCC)    Albuterol daily as needed and Spiriva daily  . Depression   . Diabetes mellitus type II    takes Metformin and Glipizide daily  . Dizziness   . GERD (gastroesophageal reflux disease)   . GI bleed 12/2015  . Headache    occasionally  . History of blood clots    in right leg;being followed by Dr.Brabham for this  . History of blood transfusion 09/2014   2 units-no abnormal reaction noted  . History of gastric ulcer   . History of hiatal hernia   . HTN (hypertension) 09/14/2015  . Hyperlipidemia 08/27/2014   takes  Simvastatin daily  . Hypertension    takes Amlodipine and Lisinopril daily  . Hypothyroidism 08/27/2014   takes Synthroid daily  . Insomnia    takes Pamelor nightly  . Joint pain    hips  . Muscle spasm    takes Zanaflex daily as needed  . MVP (mitral valve prolapse)   . Peripheral edema    takes Furosemide daily as needed  . Peripheral neuropathy   . Prurigo nodularis   . Shortness of breath dyspnea   . Stroke ([place])   . Urinary frequency   . Urinary urgency     ALLERGIES:  is allergic to doxycycline; lipitor [atorvastatin]; and oxycodone.  MEDICATIONS:  Current Outpatient Medications  Medication Sig Dispense Refill  . amLODipine (NORVASC) 5 MG tablet Take 1 tablet (5 mg total) daily by mouth. 90 tablet 3  . aspirin EC 81 MG tablet Take 1 tablet (81 mg total) by mouth daily. 90 tablet 11  . ATROVENT HFA 17 MCG/ACT inhaler INHALE 2 PUFFS INTO THE LUNGS FOUR TIMES DAILY (Patient taking differently: INHALE 2 PUFFS INTO THE LUNGS 2-3 TIMES DAILY) 38.7 g 0  . Blood Pressure Monitor DEVI Use to check Blood pressure daily 1 Device 0  . ferrous sulfate 325 (65 FE) MG tablet Take 1 tablet (325 mg total) by mouth 3 (three) times daily with meals. (Patient taking differently: Take 325 mg 2 (two) times daily by mouth. Once to twice  daily) 30 tablet 3  . furosemide (LASIX) 80 MG tablet Take 1 tablet (80 mg total) daily by mouth. 90 tablet 3  . gabapentin (NEURONTIN) 600 MG tablet TAKE 1 TABLET(600 MG) BY MOUTH THREE TIMES DAILY (Patient taking differently: Take 600 mg 3 (three) times daily as needed by mouth (for nerve pain/neuropathy.). TAKE 1 TABLET(600 MG) BY MOUTH THREE TIMES DAILY) 270 tablet 1  . glucose blood (ACCU-CHEK AVIVA PLUS) test strip Use to check blood sugars daily Dx E11.9 100 each 3  . Lancets MISC 1 each by Other route See admin instructions. Reported on 09/07/2015    . levothyroxine (SYNTHROID, LEVOTHROID) 112 MCG tablet Take 1 tablet (112 mcg total) daily by mouth. 90  tablet 3  . lisinopril (PRINIVIL,ZESTRIL) 20 MG tablet Take 1 tablet (20 mg total) daily by mouth. 90 tablet 3  . potassium chloride (K-DUR) 10 MEQ tablet TAKE 2 TABLETS BY MOUTH EVERY DAY IN THE MORNING 180 tablet 3  . prochlorperazine (COMPAZINE) 10 MG tablet TAKE 1 TABLET(10 MG) BY MOUTH EVERY 6 HOURS AS NEEDED FOR NAUSEA OR VOMITING 385 tablet 0  . Respiratory Therapy Supplies (NEBULIZER) DEVI Use as directed four times daily 1 each 0  . tiZANidine (ZANAFLEX) 4 MG tablet Take 1 tablet (4 mg total) every 6 (six) hours as needed by mouth for muscle spasms. 60 tablet 2  . umeclidinium-vilanterol (ANORO ELLIPTA) 62.5-25 MCG/INH AEPB Inhale 1 puff into the lungs daily. 1 each 3  . metolazone (ZAROXOLYN) 5 MG tablet Take 1 tablet (5 mg total) daily as needed by mouth. 90 tablet 3   No current facility-administered medications for this visit.    Facility-Administered Medications Ordered in Other Visits  Medication Dose Route Frequency Provider Last Rate Last Dose  . cloNIDine (CATAPRES) tablet 0.3 mg  0.3 mg Oral Daily Harle Stanford., PA-C        SURGICAL HISTORY:  Past Surgical History:  Procedure Laterality Date  . ENDARTERECTOMY Left 11/13/2014   Procedure: ENDARTERECTOMY CAROTID WITH PATCH ANGIOPLASTY;  Surgeon: Serafina Mitchell, MD;  Location: [facility] OR;  Service: Vascular;  Laterality: Left;  . ESOPHAGOGASTRODUODENOSCOPY N/A 09/24/2014   Procedure: ESOPHAGOGASTRODUODENOSCOPY (EGD);  Surgeon: Winfield Cunas., MD;  Location: [facility] ENDOSCOPY;  Service: Endoscopy;  Laterality: N/A;  . FUDUCIAL PLACEMENT N/A 06/26/2017   Procedure: PLACEMENT OF FUDUCIAL;  Surgeon: Grace Isaac, MD;  Location: [facility];  Service: Thoracic;  Laterality: N/A;  . I&D of right arm    . INCISION AND DRAINAGE PERIRECTAL ABSCESS  03/2009  . Neg Stress Test  2000  . PERIPHERAL VASCULAR CATHETERIZATION N/A 11/30/2015   Procedure: Carotid PTA/Stent Intervention;  Surgeon: Serafina Mitchell, MD;  Location: [facility] CV  LAB;  Service: Cardiovascular;  Laterality: N/A;  . ROTATOR CUFF REPAIR Right 2009   multiple f/u Sxs/infection Tx  . VASCULAR SURGERY    . VIDEO BRONCHOSCOPY WITH ENDOBRONCHIAL NAVIGATION N/A 05/25/2017   Procedure: VIDEO BRONCHOSCOPY WITH ENDOBRONCHIAL NAVIGATION;  Surgeon: Rigoberto Noel, MD;  Location: [facility];  Service: Thoracic;  Laterality: N/A;  . VIDEO BRONCHOSCOPY WITH ENDOBRONCHIAL NAVIGATION N/A 06/26/2017   Procedure: VIDEO BRONCHOSCOPY WITH ENDOBRONCHIAL NAVIGATION WITH PLACEMENT OF FIDUCIALS;  Surgeon: Grace Isaac, MD;  Location: [facility];  Service: Thoracic;  Laterality: N/A;  . VIDEO BRONCHOSCOPY WITH ENDOBRONCHIAL ULTRASOUND N/A 05/25/2017   Procedure: VIDEO BRONCHOSCOPY WITH ENDOBRONCHIAL ULTRASOUND;  Surgeon: Rigoberto Noel, MD;  Location: [facility];  Service: Thoracic;  Laterality: N/A;    REVIEW  OF SYSTEMS:  A comprehensive review of systems was negative except for: Constitutional: positive for fatigue   PHYSICAL EXAMINATION: General appearance: alert, cooperative and no distress Head: Normocephalic, without obvious abnormality, atraumatic Neck: no adenopathy, no JVD, supple, symmetrical, trachea midline and thyroid not enlarged, symmetric, no tenderness/mass/nodules Lymph nodes: Cervical, supraclavicular, and axillary nodes normal. Resp: clear to auscultation bilaterally Back: symmetric, no curvature. ROM normal. No CVA tenderness. Cardio: regular rate and rhythm, S1, S2 normal, no murmur, click, rub or gallop GI: soft, non-tender; bowel sounds normal; no masses,  no organomegaly Extremities: extremities normal, atraumatic, no cyanosis or edema  ECOG PERFORMANCE STATUS: 1 - Symptomatic but completely ambulatory  Blood pressure (!) 166/78, pulse 71, temperature 97.7 F (36.5 C), temperature source Oral, height 5\' 11"  (1.803 m), weight 233 lb 3.2 oz (105.8 kg), SpO2 99 %.  LABORATORY DATA: Lab Results  Component Value Date   WBC 3.6 (L) 09/24/2017   HGB 10.8 (L)  09/24/2017   HCT 33.4 (L) 09/24/2017   MCV 74.4 (L) 09/24/2017   PLT 179 09/24/2017      Chemistry      Component Value Date/Time   NA 139 09/24/2017 0933   NA 138 08/06/2017 0856   K 3.5 09/24/2017 0933   K 3.6 08/06/2017 0856   CL 102 09/24/2017 0933   CO2 29 09/24/2017 0933   CO2 33 (H) 08/06/2017 0856   BUN 17 09/24/2017 0933   BUN 16.0 08/06/2017 0856   CREATININE 1.20 09/24/2017 0933   CREATININE 1.3 08/06/2017 0856      Component Value Date/Time   CALCIUM 8.9 09/24/2017 0933   CALCIUM 9.1 08/06/2017 0856   ALKPHOS 119 09/24/2017 0933   ALKPHOS 139 08/06/2017 0856   AST 20 09/24/2017 0933   AST 15 08/06/2017 0856   ALT 9 09/24/2017 0933   ALT 9 08/06/2017 0856   BILITOT 0.4 09/24/2017 0933   BILITOT 0.35 08/06/2017 0856       RADIOGRAPHIC STUDIES: No results found.  ASSESSMENT AND PLAN: This is a very pleasant 62 years old white male with a stage IIIa non-small cell lung cancer, squamous cell carcinoma currently undergoing a course of concurrent chemoradiation with weekly carboplatin and paclitaxel status post 5 cycles. The patient continues to tolerate this treatment well with no concerning complaints. I recommended for him to proceed with cycle #6 today as a scheduled. I will see him back for follow-up visit in 2 weeks for evaluation and consideration of repeating CT scan of the chest. For hypertension, strongly encouraged the patient to take his blood pressure medication as prescribed and to reconsult with his primary care physician if needed for adjustment of his medication. The patient was advised to call immediately if he has any concerning symptoms in the interval. The patient voices understanding of current disease status and treatment options and is in agreement with the current care plan.  All questions were answered. The patient knows to call the clinic with any problems, questions or concerns. We can certainly see the patient much sooner if  necessary.  I spent 10 minutes counseling the patient face to face. The total time spent in the appointment was 15 minutes.  Disclaimer: This note was dictated with voice recognition software. Similar sounding words can inadvertently be transcribed and may not be corrected upon review.

## 2017-09-24 NOTE — Patient Instructions (Signed)
[facility] Discharge Instructions for Patients Receiving Chemotherapy  Today you received the following chemotherapy agents:  Taxol, Carboplatin  To help prevent nausea and vomiting after your treatment, we encourage you to take your nausea medication as prescribed.   If you develop nausea and vomiting that is not controlled by your nausea medication, call the clinic.   BELOW ARE SYMPTOMS THAT SHOULD BE REPORTED IMMEDIATELY:  *FEVER GREATER THAN 100.5 F  *CHILLS WITH OR WITHOUT FEVER  NAUSEA AND VOMITING THAT IS NOT CONTROLLED WITH YOUR NAUSEA MEDICATION  *UNUSUAL SHORTNESS OF BREATH  *UNUSUAL BRUISING OR BLEEDING  TENDERNESS IN MOUTH AND THROAT WITH OR WITHOUT PRESENCE OF ULCERS  *URINARY PROBLEMS  *BOWEL PROBLEMS  UNUSUAL RASH Items with * indicate a potential emergency and should be followed up as soon as possible.  Feel free to call the clinic should you have any questions or concerns. The clinic phone number is [phone].  Please show the CHEMO ALERT CARD at check-in to the Emergency Department and triage nurse.   

## 2017-09-25 ENCOUNTER — Ambulatory Visit: Payer: Medicare Other

## 2017-09-25 ENCOUNTER — Ambulatory Visit
Admission: RE | Admit: 2017-09-25 | Discharge: 2017-09-25 | Disposition: A | Discharge status: Home / Self Care | Payer: Medicare Other | Source: Ambulatory Visit | Attending: Radiation Oncology | Admitting: Radiation Oncology

## 2017-09-25 DIAGNOSIS — Z510 Encounter for antineoplastic radiation therapy: Secondary | ICD-10-CM | POA: Diagnosis not present

## 2017-09-25 DIAGNOSIS — F17210 Nicotine dependence, cigarettes, uncomplicated: Secondary | ICD-10-CM | POA: Diagnosis not present

## 2017-09-25 DIAGNOSIS — M129 Arthropathy, unspecified: Secondary | ICD-10-CM | POA: Diagnosis not present

## 2017-09-25 DIAGNOSIS — D649 Anemia, unspecified: Secondary | ICD-10-CM | POA: Diagnosis not present

## 2017-09-25 DIAGNOSIS — I6529 Occlusion and stenosis of unspecified carotid artery: Secondary | ICD-10-CM | POA: Diagnosis not present

## 2017-09-25 DIAGNOSIS — C3412 Malignant neoplasm of upper lobe, left bronchus or lung: Secondary | ICD-10-CM | POA: Diagnosis not present

## 2017-09-25 NOTE — Telephone Encounter (Signed)
Error

## 2017-09-27 ENCOUNTER — Ambulatory Visit
Admission: RE | Admit: 2017-09-27 | Discharge: 2017-09-27 | Disposition: A | Discharge status: Home / Self Care | Payer: Medicare Other | Source: Ambulatory Visit | Attending: Radiation Oncology | Admitting: Radiation Oncology

## 2017-09-27 DIAGNOSIS — C3412 Malignant neoplasm of upper lobe, left bronchus or lung: Secondary | ICD-10-CM | POA: Diagnosis not present

## 2017-09-27 DIAGNOSIS — M129 Arthropathy, unspecified: Secondary | ICD-10-CM | POA: Diagnosis not present

## 2017-09-27 DIAGNOSIS — D649 Anemia, unspecified: Secondary | ICD-10-CM | POA: Diagnosis not present

## 2017-09-27 DIAGNOSIS — F17210 Nicotine dependence, cigarettes, uncomplicated: Secondary | ICD-10-CM | POA: Diagnosis not present

## 2017-09-27 DIAGNOSIS — I6529 Occlusion and stenosis of unspecified carotid artery: Secondary | ICD-10-CM | POA: Diagnosis not present

## 2017-09-27 DIAGNOSIS — Z510 Encounter for antineoplastic radiation therapy: Secondary | ICD-10-CM | POA: Diagnosis not present

## 2017-09-28 ENCOUNTER — Ambulatory Visit
Admission: RE | Admit: 2017-09-28 | Discharge: 2017-09-28 | Disposition: A | Discharge status: Home / Self Care | Payer: Medicare Other | Source: Ambulatory Visit | Attending: Radiation Oncology | Admitting: Radiation Oncology

## 2017-10-01 ENCOUNTER — Ambulatory Visit
Admission: RE | Admit: 2017-10-01 | Discharge: 2017-10-01 | Disposition: A | Discharge status: Home / Self Care | Payer: Medicare Other | Source: Ambulatory Visit | Attending: Radiation Oncology | Admitting: Radiation Oncology

## 2017-10-01 ENCOUNTER — Inpatient Hospital Stay: Payer: Medicare Other

## 2017-10-01 VITALS — BP 162/64 | HR 70 | Temp 98.0°F | Resp 16

## 2017-10-01 DIAGNOSIS — Z5111 Encounter for antineoplastic chemotherapy: Secondary | ICD-10-CM | POA: Diagnosis not present

## 2017-10-01 DIAGNOSIS — I6529 Occlusion and stenosis of unspecified carotid artery: Secondary | ICD-10-CM | POA: Diagnosis not present

## 2017-10-01 DIAGNOSIS — Z510 Encounter for antineoplastic radiation therapy: Secondary | ICD-10-CM | POA: Diagnosis not present

## 2017-10-01 DIAGNOSIS — C3492 Malignant neoplasm of unspecified part of left bronchus or lung: Secondary | ICD-10-CM

## 2017-10-01 DIAGNOSIS — D649 Anemia, unspecified: Secondary | ICD-10-CM | POA: Diagnosis not present

## 2017-10-01 DIAGNOSIS — M129 Arthropathy, unspecified: Secondary | ICD-10-CM | POA: Diagnosis not present

## 2017-10-01 DIAGNOSIS — F17210 Nicotine dependence, cigarettes, uncomplicated: Secondary | ICD-10-CM | POA: Diagnosis not present

## 2017-10-01 DIAGNOSIS — C3412 Malignant neoplasm of upper lobe, left bronchus or lung: Secondary | ICD-10-CM | POA: Diagnosis not present

## 2017-10-01 LAB — CBC WITH DIFFERENTIAL/PLATELET
Basophils Absolute: 0.1 10*3/uL (ref 0.0–0.1)
Basophils Relative: 3 %
Eosinophils Absolute: 0.1 10*3/uL (ref 0.0–0.5)
Eosinophils Relative: 4 %
HCT: 34.3 % — ABNORMAL LOW (ref 38.4–49.9)
Hemoglobin: 11.3 g/dL — ABNORMAL LOW (ref 13.0–17.1)
Lymphocytes Relative: 16 %
Lymphs Abs: 0.4 10*3/uL — ABNORMAL LOW (ref 0.9–3.3)
MCH: 24.6 pg — ABNORMAL LOW (ref 27.2–33.4)
MCHC: 33 g/dL (ref 32.0–36.0)
MCV: 74.5 fL — ABNORMAL LOW (ref 79.3–98.0)
Monocytes Absolute: 0.3 10*3/uL (ref 0.1–0.9)
Monocytes Relative: 13 %
Neutro Abs: 1.6 10*3/uL (ref 1.5–6.5)
Neutrophils Relative %: 64 %
Platelets: 228 10*3/uL (ref 140–400)
RBC: 4.6 MIL/uL (ref 4.20–5.82)
RDW: 22.8 % — ABNORMAL HIGH (ref 11.0–14.6)
WBC: 2.4 10*3/uL — ABNORMAL LOW (ref 4.0–10.3)

## 2017-10-01 LAB — COMPREHENSIVE METABOLIC PANEL
ALT: 13 U/L (ref 0–55)
AST: 24 U/L (ref 5–34)
Albumin: 2.8 g/dL — ABNORMAL LOW (ref 3.5–5.0)
Alkaline Phosphatase: 123 U/L (ref 40–150)
Anion gap: 10 (ref 3–11)
BUN: 21 mg/dL (ref 7–26)
CO2: 28 mmol/L (ref 22–29)
Calcium: 9.4 mg/dL (ref 8.4–10.4)
Chloride: 100 mmol/L (ref 98–109)
Creatinine, Ser: 1.23 mg/dL (ref 0.70–1.30)
GFR calc Af Amer: >60 mL/min (ref >60)
GFR calc non Af Amer: >60 mL/min (ref >60)
Glucose, Bld: 97 mg/dL (ref 70–140)
Potassium: 3.4 mmol/L — ABNORMAL LOW (ref 3.5–5.1)
Sodium: 138 mmol/L (ref 136–145)
Total Bilirubin: 0.3 mg/dL (ref 0.2–1.2)
Total Protein: 7 g/dL (ref 6.4–8.3)

## 2017-10-01 MED ORDER — PALONOSETRON HCL INJECTION 0.25 MG/5ML
0.2500 mg | Freq: Once | INTRAVENOUS | Status: AC
  Administered 2017-10-01: 0.25 mg via INTRAVENOUS

## 2017-10-01 MED ORDER — SODIUM CHLORIDE 0.9% FLUSH
10.0000 mL | INTRAVENOUS | Status: DC | PRN
  Filled 2017-10-01: qty 10

## 2017-10-01 MED ORDER — SODIUM CHLORIDE 0.9 % IV SOLN
20.0000 mg | Freq: Once | INTRAVENOUS | Status: AC
  Administered 2017-10-01: 20 mg via INTRAVENOUS
  Filled 2017-10-01: qty 2

## 2017-10-01 MED ORDER — SODIUM CHLORIDE 0.9 % IV SOLN
45.0000 mg/m2 | Freq: Once | INTRAVENOUS | Status: AC
  Administered 2017-10-01: 102 mg via INTRAVENOUS
  Filled 2017-10-01: qty 17

## 2017-10-01 MED ORDER — SODIUM CHLORIDE 0.9 % IV SOLN
Freq: Once | INTRAVENOUS | Status: AC
  Administered 2017-10-01: 13:00:00 via INTRAVENOUS

## 2017-10-01 MED ORDER — DIPHENHYDRAMINE HCL 50 MG/ML IJ SOLN
INTRAMUSCULAR | Status: AC
  Filled 2017-10-01: qty 1

## 2017-10-01 MED ORDER — PALONOSETRON HCL INJECTION 0.25 MG/5ML
INTRAVENOUS | Status: AC
  Filled 2017-10-01: qty 5

## 2017-10-01 MED ORDER — FAMOTIDINE IN NACL 20-0.9 MG/50ML-% IV SOLN: 20.0000 mg | Freq: Once | INTRAVENOUS | Status: DC

## 2017-10-01 MED ORDER — HEPARIN SOD (PORK) LOCK FLUSH 100 UNIT/ML IV SOLN
500.0000 [IU] | Freq: Once | INTRAVENOUS | Status: DC | PRN
  Filled 2017-10-01: qty 5

## 2017-10-01 MED ORDER — SODIUM CHLORIDE 0.9 % IV SOLN
238.2000 mg | Freq: Once | INTRAVENOUS | Status: AC
  Administered 2017-10-01: 240 mg via INTRAVENOUS
  Filled 2017-10-01: qty 24

## 2017-10-01 MED ORDER — DIPHENHYDRAMINE HCL 50 MG/ML IJ SOLN
50.0000 mg | Freq: Once | INTRAMUSCULAR | Status: AC
  Administered 2017-10-01: 50 mg via INTRAVENOUS

## 2017-10-01 NOTE — Patient Instructions (Signed)
   [facility] Discharge Instructions for Patients Receiving Chemotherapy  Today you received the following chemotherapy agents Taxol and Carboplatin   To help prevent nausea and vomiting after your treatment, we encourage you to take your nausea medication as directed.    If you develop nausea and vomiting that is not controlled by your nausea medication, call the clinic.   BELOW ARE SYMPTOMS THAT SHOULD BE REPORTED IMMEDIATELY:  *FEVER GREATER THAN 100.5 F  *CHILLS WITH OR WITHOUT FEVER  NAUSEA AND VOMITING THAT IS NOT CONTROLLED WITH YOUR NAUSEA MEDICATION  *UNUSUAL SHORTNESS OF BREATH  *UNUSUAL BRUISING OR BLEEDING  TENDERNESS IN MOUTH AND THROAT WITH OR WITHOUT PRESENCE OF ULCERS  *URINARY PROBLEMS  *BOWEL PROBLEMS  UNUSUAL RASH Items with * indicate a potential emergency and should be followed up as soon as possible.  Feel free to call the clinic should you have any questions or concerns. The clinic phone number is [phone].  Please show the CHEMO ALERT CARD at check-in to the Emergency Department and triage nurse.   

## 2017-10-02 ENCOUNTER — Ambulatory Visit: Payer: Medicare Other

## 2017-10-03 ENCOUNTER — Ambulatory Visit: Payer: Medicare Other | Attending: Radiation Oncology

## 2017-10-04 ENCOUNTER — Ambulatory Visit: Payer: Medicare Other

## 2017-10-05 ENCOUNTER — Telehealth: Payer: Self-pay | Admitting: Radiation Oncology

## 2017-10-05 ENCOUNTER — Ambulatory Visit: Payer: Medicare Other

## 2017-10-05 ENCOUNTER — Ambulatory Visit: Payer: Medicare Other | Attending: Radiation Oncology

## 2017-10-05 ENCOUNTER — Encounter: Payer: Self-pay | Admitting: General Practice

## 2017-10-05 NOTE — Progress Notes (Signed)
[place] CSW Progress Note  CSW spoke w patient who states he intends to keep appointments on Monday.  Has gas, was encouraged to ask for second gas card from [facility] from [place] if necessary to assist w transport.  Edwyna Shell, LCSW Clinical Social Worker Phone:  [phone]

## 2017-10-05 NOTE — Telephone Encounter (Signed)
Threasa Beards, RT on L2 informed this RN that the patient did not show for his treatment appointment today. She reports the patient received treatment on Monday (10/01/2017) but none since. Phoned patient to inquire. Patient reports his car is broken down up the road. Patient expressed his intentions to present Monday for treatment. Informed Heather, RT on L2 of these findings. She verbalized understanding.

## 2017-10-08 ENCOUNTER — Ambulatory Visit: Payer: Medicare Other | Admitting: Nurse Practitioner

## 2017-10-08 ENCOUNTER — Other Ambulatory Visit: Payer: Medicare Other

## 2017-10-08 ENCOUNTER — Ambulatory Visit: Payer: Medicare Other

## 2017-10-08 ENCOUNTER — Other Ambulatory Visit: Payer: Self-pay | Admitting: Medical Oncology

## 2017-10-08 DIAGNOSIS — C3492 Malignant neoplasm of unspecified part of left bronchus or lung: Secondary | ICD-10-CM

## 2017-10-09 ENCOUNTER — Encounter: Payer: Self-pay | Admitting: General Practice

## 2017-10-09 ENCOUNTER — Ambulatory Visit: Payer: Medicare Other

## 2017-10-09 NOTE — Progress Notes (Signed)
[place] CSW Progress Note  Request received from [facility] to follow up on missed appointments.  "I am just feeling poorly, maybe its the flu."  States he has been "resting, drinking plenty of water and eating good."  Did not feel up to making radiation appointments, aware that he has missed multiple treatments.  Asked that CSW notify RN of his situation - states that "I will be there Thursday and Friday but not tomorrow."  States that he has transportation, "my car is working fine now."  CSW encouraged patient to call w any problems, encouraging speaking w treatment team and completing course of treatment as scheduled.  Edwyna Shell, LCSW Clinical Social Worker Phone:  [phone]

## 2017-10-10 ENCOUNTER — Ambulatory Visit: Payer: Medicare Other

## 2017-10-11 ENCOUNTER — Ambulatory Visit: Payer: Medicare Other

## 2017-10-12 ENCOUNTER — Ambulatory Visit: Payer: Medicare Other

## 2017-10-15 ENCOUNTER — Ambulatory Visit: Payer: Medicare Other | Admitting: Cardiology

## 2017-10-15 ENCOUNTER — Ambulatory Visit: Payer: Medicare Other

## 2017-10-15 DIAGNOSIS — R0989 Other specified symptoms and signs involving the circulatory and respiratory systems: Secondary | ICD-10-CM

## 2017-10-16 ENCOUNTER — Ambulatory Visit: Payer: Medicare Other

## 2017-10-16 ENCOUNTER — Encounter: Payer: Self-pay | Admitting: Cardiology

## 2017-10-16 ENCOUNTER — Telehealth: Payer: Self-pay | Admitting: General Practice

## 2017-10-16 NOTE — Telephone Encounter (Signed)
[place] CSW Progress Note  Attempt to contact patient to discuss why he has missed appointments for continued radiation treatment.  No answer, no VM.  CSW Dalene Seltzer also attempted twice on 10/15/17 to contact patient re missed appointments.  Unable to ascertain why patient has not been able to keep scheduled appointments/complete radiation treatment.  Edwyna Shell, LCSW Clinical Social Worker Phone:  [phone]

## 2017-10-17 ENCOUNTER — Ambulatory Visit: Payer: Medicare Other

## 2017-10-17 ENCOUNTER — Other Ambulatory Visit: Payer: Self-pay | Admitting: Urology

## 2017-10-17 DIAGNOSIS — C3492 Malignant neoplasm of unspecified part of left bronchus or lung: Secondary | ICD-10-CM

## 2017-10-18 ENCOUNTER — Ambulatory Visit: Payer: Self-pay

## 2017-10-18 ENCOUNTER — Ambulatory Visit: Payer: Medicare Other

## 2017-10-19 ENCOUNTER — Ambulatory Visit: Payer: Self-pay

## 2017-10-22 ENCOUNTER — Encounter: Payer: Self-pay | Admitting: *Deleted

## 2017-10-22 ENCOUNTER — Ambulatory Visit: Payer: Self-pay

## 2017-10-22 ENCOUNTER — Telehealth: Payer: Self-pay | Admitting: *Deleted

## 2017-10-22 DIAGNOSIS — C3492 Malignant neoplasm of unspecified part of left bronchus or lung: Secondary | ICD-10-CM

## 2017-10-22 NOTE — Telephone Encounter (Signed)
Oncology Nurse Navigator Documentation  Oncology Nurse Navigator Flowsheets 10/22/2017  Navigator Location [facility]  Navigator Encounter Type Telephone/I called to check on Brad [name]. I was able to reach him.  I asked how he was doing he states ok.  I asked if he could be scheduled for a follow up with Dr. Julien Nordmann. He states yes. I will follow up with Dr. Julien Nordmann and figure out when is a good time for him to come back. I stated that he will get a call from scheduling to get him back with Dr. Julien Nordmann.   Telephone Outgoing Call  Barriers/Navigation Needs Coordination of Care;Education  Education Other  Interventions Coordination of Care;Education  Coordination of Care Other  Education Method Verbal  Acuity Level 2  Time Spent with Patient 30

## 2017-10-22 NOTE — Progress Notes (Signed)
Oncology Nurse Navigator Documentation  Oncology Nurse Navigator Flowsheets 10/22/2017  Navigator Location [facility]  Navigator Encounter Type Other/I followed up with Dr. Julien Nordmann on Mr. Polidori schedule. He ordered CT chest and follow up in 2 weeks with Dr. Julien Nordmann. I notified scheduling and completed verbal order for CT scan.   Barriers/Navigation Needs Coordination of Care  Education Other  Interventions Coordination of Care  Coordination of Care Other  Acuity Level 2  Time Spent with Patient 30

## 2017-10-23 ENCOUNTER — Telehealth: Payer: Self-pay | Admitting: Internal Medicine

## 2017-10-23 ENCOUNTER — Ambulatory Visit: Payer: Self-pay

## 2017-10-23 NOTE — Telephone Encounter (Signed)
Scheduled appt per 3/18 sch message -  Unable to leave message - sent reminder letter in the mail with appt date and time. Central radiology to contact patient with ct scan .

## 2017-10-25 ENCOUNTER — Other Ambulatory Visit: Payer: Self-pay | Admitting: Medical Oncology

## 2017-10-25 DIAGNOSIS — C3492 Malignant neoplasm of unspecified part of left bronchus or lung: Secondary | ICD-10-CM

## 2017-10-26 ENCOUNTER — Other Ambulatory Visit: Payer: Medicare Other

## 2017-10-30 ENCOUNTER — Encounter: Payer: Self-pay | Admitting: Radiation Oncology

## 2017-10-30 NOTE — Progress Notes (Addendum)
  Radiation Oncology         [phone] ________________________________  Name: Brad [name] MRN: [id number]  Date: 10/30/2017  DOB: May 13, 1956  End of Treatment Note  Diagnosis: 62 y.o.  [place] with newly diagnosed Stage III ([id number]) NSCLC, squamous cell carcinoma of the left upper lobe      Indication for treatment: Curative       Radiation treatment dates: 07/18/17-10/01/17, patient missed an extensive number of treatment days including: 12/19, 12/21, 12/24-28, 01/04, 01/10, 01/14-01/18, 01/30-02/01, 02/04-02/06, 02/06, 02/13, 02/20, 02/22  Site/dose: Left lung/ completed 58 Gy in 29 fractions of a planned 66 Gy in 33 fractions  Beams/energy: 3D/ 6X  Narrative: The patient tolerated radiation treatment relatively well. During treatment, the patient complained of moderate fatigue and a dry cough and was noted to have inspiratory wheezing on exam. The patient reported transportation issues and missed multiple appointments throughout treatment.  He did no show up for treatment after 10/01/17 and treatment was therefore discontinued.   Plan: The patient has completed radiation treatment. We will arrange for a follow up CT Chest in the next 1-2 weeks to assess treatment response and he will return to radiation oncology clinic thereafter to review results. I advised him to call or return sooner if he has any questions or concerns related to his recovery or treatment. ________________________________  Sheral Apley. Tammi Klippel, M.D.   This document serves as a record of services personally performed by Brad Pita, MD. It was created on his behalf by Bethann Humble, a trained medical scribe. The creation of this record is based on the scribe's personal observations and the provider's statements to them. This document has been checked and approved by the attending provider.

## 2017-10-31 ENCOUNTER — Telehealth: Payer: Self-pay | Admitting: *Deleted

## 2017-10-31 ENCOUNTER — Ambulatory Visit (HOSPITAL_COMMUNITY): Payer: Medicare Other

## 2017-10-31 NOTE — Telephone Encounter (Signed)
Call from Radiology, pt no showed CT scan appt. Called pt. Pt states " I just didn't feel like going." Gave pt phone # to Central Scheduling to r/s Scan appt. Pt verbalized understanding and  repeated phone# given. Discussed with pt upcoming office visit will be cancelled and r/s after Scan appt has been made. Pt confirmed understanding.

## 2017-11-06 ENCOUNTER — Ambulatory Visit: Payer: Medicare Other | Admitting: Internal Medicine

## 2017-11-07 ENCOUNTER — Encounter: Payer: Self-pay | Admitting: *Deleted

## 2017-11-07 ENCOUNTER — Telehealth: Payer: Self-pay | Admitting: Internal Medicine

## 2017-11-07 NOTE — Progress Notes (Signed)
Oncology Nurse Navigator Documentation  Oncology Nurse Navigator Flowsheets 11/07/2017  Navigator Location [facility]  Navigator Encounter Type Other/I followed up on Brad [name] schedule. He has a new appt for his ct scan on 4/5.  I notified scheduling team to make a follow up appt with Dr. Julien Nordmann week on 11/12/17.    Barriers/Navigation Needs Coordination of Care  Interventions Coordination of Care  Coordination of Care Other  Acuity Level 2  Time Spent with Patient 30

## 2017-11-07 NOTE — Telephone Encounter (Signed)
Scheduled appt per 4/3 sch message - patient is aware of appt date and time.

## 2017-11-09 ENCOUNTER — Ambulatory Visit (HOSPITAL_COMMUNITY): Admission: RE | Admit: 2017-11-09 | Payer: Medicare Other | Source: Ambulatory Visit

## 2017-11-09 ENCOUNTER — Telehealth: Payer: Self-pay | Admitting: Internal Medicine

## 2017-11-09 NOTE — Telephone Encounter (Signed)
R/s appt per 4/4 sch message - lab before ct.  Unable to reach patient or leave message - sent reminder letter in the mail with appt date and time.

## 2017-11-12 ENCOUNTER — Other Ambulatory Visit: Payer: Self-pay | Admitting: Medical Oncology

## 2017-11-12 DIAGNOSIS — C3492 Malignant neoplasm of unspecified part of left bronchus or lung: Secondary | ICD-10-CM

## 2017-11-13 ENCOUNTER — Ambulatory Visit (HOSPITAL_COMMUNITY): Admission: RE | Admit: 2017-11-13 | Payer: Medicare Other | Source: Ambulatory Visit

## 2017-11-13 ENCOUNTER — Other Ambulatory Visit: Payer: Self-pay

## 2017-11-13 ENCOUNTER — Other Ambulatory Visit: Payer: Medicare Other

## 2017-11-13 DIAGNOSIS — C3492 Malignant neoplasm of unspecified part of left bronchus or lung: Secondary | ICD-10-CM

## 2017-11-14 ENCOUNTER — Other Ambulatory Visit: Payer: Medicare Other

## 2017-11-14 ENCOUNTER — Telehealth: Payer: Self-pay | Admitting: Oncology

## 2017-11-14 ENCOUNTER — Telehealth: Payer: Self-pay | Admitting: Medical Oncology

## 2017-11-14 ENCOUNTER — Telehealth: Payer: Self-pay | Admitting: *Deleted

## 2017-11-14 ENCOUNTER — Ambulatory Visit: Payer: Medicare Other | Admitting: Oncology

## 2017-11-14 ENCOUNTER — Encounter: Payer: Self-pay | Admitting: Oncology

## 2017-11-14 NOTE — Progress Notes (Signed)
The patient was a no-show for his appointment today.  The patient also missed his lab and CT scan appointment.  Orders for lab and CT change to 11/23/2017.  Message sent to scheduling to reschedule the patient after his CT scan to review the results.

## 2017-11-14 NOTE — Telephone Encounter (Signed)
LVM to call back re missed appt today.

## 2017-11-14 NOTE — Telephone Encounter (Signed)
Patient unable - left message for patient to call back and r/s missed appts per 4/10 sch message. - message taker aware that Central radiology will have to r/s CT - number given

## 2017-11-14 NOTE — Telephone Encounter (Signed)
Oncology Nurse Navigator Documentation  Oncology Nurse Navigator Flowsheets 11/14/2017  Navigator Location [facility]  Navigator Encounter Type Telephone/I called to check on [name]. He missed his CT scan and follow up appt today with [name].  I spoke with patient. He states he would like to be rescheduled next week. I called central scheduling to get an appt for CT.  I called Mr. Heyde back with an update on CT app and pre-procedure instructions as well as an appt on 4/19 with Dr. Julien Nordmann.   Treatment Phase Follow-up  Barriers/Navigation Needs Coordination of Care;Education  Education Other  Interventions Coordination of Care;Education  Coordination of Care Appts  Education Method Verbal  Acuity Level 2  Time Spent with Patient 30

## 2017-11-15 ENCOUNTER — Telehealth: Payer: Self-pay | Admitting: Internal Medicine

## 2017-11-15 NOTE — Telephone Encounter (Signed)
Changed lab appt per 4/11 sch msg - attempted to call patient 5 times and the line was busy each time.

## 2017-11-22 ENCOUNTER — Ambulatory Visit (HOSPITAL_COMMUNITY): Admission: RE | Admit: 2017-11-22 | Payer: Medicare Other | Source: Ambulatory Visit

## 2017-11-22 ENCOUNTER — Inpatient Hospital Stay: Payer: Medicare Other | Attending: Internal Medicine

## 2017-11-23 ENCOUNTER — Inpatient Hospital Stay: Payer: Medicare Other | Admitting: Internal Medicine

## 2017-11-28 ENCOUNTER — Telehealth: Payer: Self-pay | Admitting: *Deleted

## 2017-11-28 NOTE — Telephone Encounter (Signed)
S/w pt, multiple attempts to reach pt regarding no show and cancelled appts. Discussed with pt importance of getting a CT scan and f/u with MD. Pt states " I have not felt good so I didn't go to my appt." Gave pt phone # to Central scheduling to r/s CT scan appt. Once completed pt is to call [facility] for a f/u appt with MD to review results. Pt verbalized understanding, confirmed # to call to r/s Scan. No further concerns.

## 2017-12-06 DIAGNOSIS — Z808 Family history of malignant neoplasm of other organs or systems: Secondary | ICD-10-CM | POA: Diagnosis not present

## 2017-12-06 DIAGNOSIS — Z85118 Personal history of other malignant neoplasm of bronchus and lung: Secondary | ICD-10-CM | POA: Diagnosis not present

## 2017-12-06 DIAGNOSIS — Z1509 Genetic susceptibility to other malignant neoplasm: Secondary | ICD-10-CM | POA: Diagnosis not present

## 2017-12-07 ENCOUNTER — Telehealth: Payer: Self-pay | Admitting: *Deleted

## 2017-12-07 NOTE — Telephone Encounter (Signed)
Oncology Nurse Navigator Documentation  Oncology Nurse Navigator Flowsheets 12/07/2017  Navigator Location [facility]  Navigator Encounter Type Telephone/I called to check on Brad [name].  I educated on needing Ct scan then coming back to see Dr. Julien Nordmann. I gave him the phone number to call central scheduling to make an appt for CT scan. He verbalized understanding of needing to make appt for scan and the phone number to call. Once this is completed, he will be scheduled for follow up.   Telephone Outgoing Call  Treatment Phase Follow-up  Barriers/Navigation Needs Education  Education Other  Interventions Education  Education Method Verbal  Acuity Level 1  Time Spent with Patient 15

## 2018-01-05 ENCOUNTER — Other Ambulatory Visit: Payer: Self-pay | Admitting: Internal Medicine

## 2018-11-06 DIAGNOSIS — J90 Pleural effusion, not elsewhere classified: Secondary | ICD-10-CM

## 2018-11-06 HISTORY — DX: Pleural effusion, not elsewhere classified: J90

## 2018-11-20 ENCOUNTER — Inpatient Hospital Stay (HOSPITAL_COMMUNITY)
Admission: EM | Admit: 2018-11-20 | Discharge: 2018-11-27 | DRG: 180 | Payer: Medicare Other | Attending: Internal Medicine | Admitting: Internal Medicine

## 2018-11-20 ENCOUNTER — Encounter (HOSPITAL_COMMUNITY): Payer: Self-pay | Admitting: Emergency Medicine

## 2018-11-20 ENCOUNTER — Other Ambulatory Visit: Payer: Self-pay

## 2018-11-20 ENCOUNTER — Emergency Department (HOSPITAL_COMMUNITY): Payer: Medicare Other

## 2018-11-20 DIAGNOSIS — E039 Hypothyroidism, unspecified: Secondary | ICD-10-CM | POA: Diagnosis not present

## 2018-11-20 DIAGNOSIS — D649 Anemia, unspecified: Secondary | ICD-10-CM

## 2018-11-20 DIAGNOSIS — I10 Essential (primary) hypertension: Secondary | ICD-10-CM | POA: Diagnosis not present

## 2018-11-20 DIAGNOSIS — K219 Gastro-esophageal reflux disease without esophagitis: Secondary | ICD-10-CM | POA: Diagnosis present

## 2018-11-20 DIAGNOSIS — I43 Cardiomyopathy in diseases classified elsewhere: Secondary | ICD-10-CM | POA: Diagnosis present

## 2018-11-20 DIAGNOSIS — Z801 Family history of malignant neoplasm of trachea, bronchus and lung: Secondary | ICD-10-CM

## 2018-11-20 DIAGNOSIS — R590 Localized enlarged lymph nodes: Secondary | ICD-10-CM | POA: Diagnosis present

## 2018-11-20 DIAGNOSIS — J918 Pleural effusion in other conditions classified elsewhere: Secondary | ICD-10-CM | POA: Diagnosis present

## 2018-11-20 DIAGNOSIS — R0609 Other forms of dyspnea: Secondary | ICD-10-CM | POA: Diagnosis not present

## 2018-11-20 DIAGNOSIS — K269 Duodenal ulcer, unspecified as acute or chronic, without hemorrhage or perforation: Secondary | ICD-10-CM | POA: Diagnosis not present

## 2018-11-20 DIAGNOSIS — I110 Hypertensive heart disease with heart failure: Secondary | ICD-10-CM | POA: Diagnosis present

## 2018-11-20 DIAGNOSIS — M62838 Other muscle spasm: Secondary | ICD-10-CM | POA: Diagnosis present

## 2018-11-20 DIAGNOSIS — D638 Anemia in other chronic diseases classified elsewhere: Secondary | ICD-10-CM | POA: Diagnosis present

## 2018-11-20 DIAGNOSIS — Z7982 Long term (current) use of aspirin: Secondary | ICD-10-CM | POA: Diagnosis not present

## 2018-11-20 DIAGNOSIS — I341 Nonrheumatic mitral (valve) prolapse: Secondary | ICD-10-CM | POA: Diagnosis present

## 2018-11-20 DIAGNOSIS — B182 Chronic viral hepatitis C: Secondary | ICD-10-CM | POA: Diagnosis present

## 2018-11-20 DIAGNOSIS — E1151 Type 2 diabetes mellitus with diabetic peripheral angiopathy without gangrene: Secondary | ICD-10-CM | POA: Diagnosis present

## 2018-11-20 DIAGNOSIS — E1142 Type 2 diabetes mellitus with diabetic polyneuropathy: Secondary | ICD-10-CM | POA: Diagnosis present

## 2018-11-20 DIAGNOSIS — C3492 Malignant neoplasm of unspecified part of left bronchus or lung: Secondary | ICD-10-CM | POA: Diagnosis not present

## 2018-11-20 DIAGNOSIS — J90 Pleural effusion, not elsewhere classified: Secondary | ICD-10-CM | POA: Diagnosis not present

## 2018-11-20 DIAGNOSIS — I119 Hypertensive heart disease without heart failure: Secondary | ICD-10-CM | POA: Diagnosis present

## 2018-11-20 DIAGNOSIS — Z8711 Personal history of peptic ulcer disease: Secondary | ICD-10-CM

## 2018-11-20 DIAGNOSIS — R946 Abnormal results of thyroid function studies: Secondary | ICD-10-CM | POA: Diagnosis present

## 2018-11-20 DIAGNOSIS — J948 Other specified pleural conditions: Secondary | ICD-10-CM | POA: Diagnosis not present

## 2018-11-20 DIAGNOSIS — G4700 Insomnia, unspecified: Secondary | ICD-10-CM | POA: Diagnosis present

## 2018-11-20 DIAGNOSIS — Z8673 Personal history of transient ischemic attack (TIA), and cerebral infarction without residual deficits: Secondary | ICD-10-CM

## 2018-11-20 DIAGNOSIS — Z885 Allergy status to narcotic agent status: Secondary | ICD-10-CM

## 2018-11-20 DIAGNOSIS — K449 Diaphragmatic hernia without obstruction or gangrene: Secondary | ICD-10-CM | POA: Diagnosis present

## 2018-11-20 DIAGNOSIS — Z79899 Other long term (current) drug therapy: Secondary | ICD-10-CM

## 2018-11-20 DIAGNOSIS — N179 Acute kidney failure, unspecified: Secondary | ICD-10-CM

## 2018-11-20 DIAGNOSIS — J42 Unspecified chronic bronchitis: Secondary | ICD-10-CM | POA: Diagnosis not present

## 2018-11-20 DIAGNOSIS — Z888 Allergy status to other drugs, medicaments and biological substances status: Secondary | ICD-10-CM

## 2018-11-20 DIAGNOSIS — J449 Chronic obstructive pulmonary disease, unspecified: Secondary | ICD-10-CM | POA: Diagnosis present

## 2018-11-20 DIAGNOSIS — Z9221 Personal history of antineoplastic chemotherapy: Secondary | ICD-10-CM

## 2018-11-20 DIAGNOSIS — I509 Heart failure, unspecified: Secondary | ICD-10-CM

## 2018-11-20 DIAGNOSIS — E785 Hyperlipidemia, unspecified: Secondary | ICD-10-CM | POA: Diagnosis present

## 2018-11-20 DIAGNOSIS — F17210 Nicotine dependence, cigarettes, uncomplicated: Secondary | ICD-10-CM | POA: Diagnosis present

## 2018-11-20 DIAGNOSIS — I5020 Unspecified systolic (congestive) heart failure: Secondary | ICD-10-CM | POA: Diagnosis not present

## 2018-11-20 DIAGNOSIS — Z923 Personal history of irradiation: Secondary | ICD-10-CM

## 2018-11-20 DIAGNOSIS — Z833 Family history of diabetes mellitus: Secondary | ICD-10-CM

## 2018-11-20 DIAGNOSIS — Z79890 Hormone replacement therapy (postmenopausal): Secondary | ICD-10-CM

## 2018-11-20 DIAGNOSIS — Z8249 Family history of ischemic heart disease and other diseases of the circulatory system: Secondary | ICD-10-CM

## 2018-11-20 DIAGNOSIS — I5043 Acute on chronic combined systolic (congestive) and diastolic (congestive) heart failure: Secondary | ICD-10-CM | POA: Diagnosis not present

## 2018-11-20 DIAGNOSIS — I248 Other forms of acute ischemic heart disease: Secondary | ICD-10-CM | POA: Diagnosis present

## 2018-11-20 DIAGNOSIS — Z881 Allergy status to other antibiotic agents status: Secondary | ICD-10-CM

## 2018-11-20 DIAGNOSIS — I5030 Unspecified diastolic (congestive) heart failure: Secondary | ICD-10-CM | POA: Diagnosis present

## 2018-11-20 DIAGNOSIS — Z86718 Personal history of other venous thrombosis and embolism: Secondary | ICD-10-CM | POA: Diagnosis not present

## 2018-11-20 DIAGNOSIS — I5023 Acute on chronic systolic (congestive) heart failure: Secondary | ICD-10-CM

## 2018-11-20 DIAGNOSIS — C3482 Malignant neoplasm of overlapping sites of left bronchus and lung: Secondary | ICD-10-CM | POA: Diagnosis not present

## 2018-11-20 DIAGNOSIS — I70219 Atherosclerosis of native arteries of extremities with intermittent claudication, unspecified extremity: Secondary | ICD-10-CM | POA: Diagnosis present

## 2018-11-20 DIAGNOSIS — Z9689 Presence of other specified functional implants: Secondary | ICD-10-CM

## 2018-11-20 HISTORY — DX: Pleural effusion, not elsewhere classified: J90

## 2018-11-20 LAB — BASIC METABOLIC PANEL
Anion gap: 10 (ref 5–15)
BUN: 13 mg/dL (ref 8–23)
CO2: 28 mmol/L (ref 22–32)
Calcium: 8.2 mg/dL — ABNORMAL LOW (ref 8.9–10.3)
Chloride: 100 mmol/L (ref 98–111)
Creatinine, Ser: 0.88 mg/dL (ref 0.61–1.24)
GFR calc Af Amer: >60 mL/min (ref >60)
GFR calc non Af Amer: >60 mL/min (ref >60)
Glucose, Bld: 123 mg/dL — ABNORMAL HIGH (ref 70–99)
Potassium: 3.6 mmol/L (ref 3.5–5.1)
Sodium: 138 mmol/L (ref 135–145)

## 2018-11-20 LAB — FERRITIN: Ferritin: 72 ng/mL (ref 24–336)

## 2018-11-20 LAB — TROPONIN I
Troponin I: 0.04 ng/mL (ref <0.03)
Troponin I: 0.04 ng/mL (ref <0.03)

## 2018-11-20 LAB — CBC
HCT: 26.4 % — ABNORMAL LOW (ref 39.0–52.0)
Hemoglobin: 7.2 g/dL — ABNORMAL LOW (ref 13.0–17.0)
MCH: 21.3 pg — ABNORMAL LOW (ref 26.0–34.0)
MCHC: 27.3 g/dL — ABNORMAL LOW (ref 30.0–36.0)
MCV: 78.1 fL — ABNORMAL LOW (ref 80.0–100.0)
Platelets: 294 10*3/uL (ref 150–400)
RBC: 3.38 MIL/uL — ABNORMAL LOW (ref 4.22–5.81)
RDW: 15.5 % (ref 11.5–15.5)
WBC: 6.7 10*3/uL (ref 4.0–10.5)
nRBC: 0 % (ref 0.0–0.2)

## 2018-11-20 LAB — POC OCCULT BLOOD, ED: Fecal Occult Bld: NEGATIVE

## 2018-11-20 LAB — BRAIN NATRIURETIC PEPTIDE: B Natriuretic Peptide: 1440.6 pg/mL — ABNORMAL HIGH (ref 0.0–100.0)

## 2018-11-20 LAB — TSH: TSH: 13.206 u[IU]/mL — ABNORMAL HIGH (ref 0.350–4.500)

## 2018-11-20 MED ORDER — AMLODIPINE BESYLATE 5 MG PO TABS
5.0000 mg | ORAL_TABLET | Freq: Every day | ORAL | Status: DC
  Administered 2018-11-21: 5 mg via ORAL
  Filled 2018-11-20: qty 1

## 2018-11-20 MED ORDER — IPRATROPIUM BROMIDE HFA 17 MCG/ACT IN AERS: 2.0000 | INHALATION_SPRAY | Freq: Four times a day (QID) | RESPIRATORY_TRACT | Status: DC

## 2018-11-20 MED ORDER — FERROUS SULFATE 325 (65 FE) MG PO TABS
325.0000 mg | ORAL_TABLET | Freq: Two times a day (BID) | ORAL | Status: DC
  Administered 2018-11-21 – 2018-11-27 (×14): 325 mg via ORAL
  Filled 2018-11-20 (×14): qty 1

## 2018-11-20 MED ORDER — NICOTINE 21 MG/24HR TD PT24
21.0000 mg | MEDICATED_PATCH | Freq: Every day | TRANSDERMAL | Status: DC
  Administered 2018-11-21 – 2018-11-27 (×8): 21 mg via TRANSDERMAL
  Filled 2018-11-20 (×8): qty 1

## 2018-11-20 MED ORDER — SODIUM CHLORIDE 0.9% FLUSH
3.0000 mL | Freq: Once | INTRAVENOUS | Status: AC
  Administered 2018-11-20: 3 mL via INTRAVENOUS

## 2018-11-20 MED ORDER — ACETAMINOPHEN 650 MG RE SUPP: 650.0000 mg | Freq: Four times a day (QID) | RECTAL | Status: DC | PRN

## 2018-11-20 MED ORDER — FUROSEMIDE 10 MG/ML IJ SOLN
40.0000 mg | Freq: Two times a day (BID) | INTRAMUSCULAR | Status: DC
  Administered 2018-11-21: 40 mg via INTRAVENOUS
  Filled 2018-11-20: qty 4

## 2018-11-20 MED ORDER — FUROSEMIDE 10 MG/ML IJ SOLN
40.0000 mg | Freq: Once | INTRAMUSCULAR | Status: AC
  Administered 2018-11-20: 40 mg via INTRAVENOUS
  Filled 2018-11-20: qty 4

## 2018-11-20 MED ORDER — LISINOPRIL 20 MG PO TABS
20.0000 mg | ORAL_TABLET | Freq: Every day | ORAL | Status: DC
  Administered 2018-11-21: 20 mg via ORAL
  Filled 2018-11-20: qty 1

## 2018-11-20 MED ORDER — POTASSIUM CHLORIDE 20 MEQ/15ML (10%) PO SOLN
20.0000 meq | Freq: Two times a day (BID) | ORAL | Status: DC
  Administered 2018-11-21 (×3): 20 meq via ORAL
  Filled 2018-11-20 (×3): qty 15

## 2018-11-20 MED ORDER — ACETAMINOPHEN 325 MG PO TABS: 650.0000 mg | ORAL_TABLET | Freq: Four times a day (QID) | ORAL | Status: DC | PRN

## 2018-11-20 MED ORDER — LEVOTHYROXINE SODIUM 112 MCG PO TABS
112.0000 ug | ORAL_TABLET | Freq: Every day | ORAL | Status: DC
  Administered 2018-11-21 – 2018-11-27 (×7): 112 ug via ORAL
  Filled 2018-11-20 (×7): qty 1

## 2018-11-20 MED ORDER — UMECLIDINIUM-VILANTEROL 62.5-25 MCG/INH IN AEPB
1.0000 | INHALATION_SPRAY | Freq: Every day | RESPIRATORY_TRACT | Status: DC
  Administered 2018-11-21 – 2018-11-27 (×7): 1 via RESPIRATORY_TRACT
  Filled 2018-11-20 (×2): qty 14

## 2018-11-20 MED ORDER — INSULIN ASPART 100 UNIT/ML ~~LOC~~ SOLN
0.0000 [IU] | Freq: Three times a day (TID) | SUBCUTANEOUS | Status: DC
  Administered 2018-11-21: 17:00:00 2 [IU] via SUBCUTANEOUS
  Administered 2018-11-23 – 2018-11-24 (×3): 1 [IU] via SUBCUTANEOUS
  Administered 2018-11-24 – 2018-11-25 (×2): 2 [IU] via SUBCUTANEOUS
  Administered 2018-11-26: 1 [IU] via SUBCUTANEOUS
  Administered 2018-11-26: 13:00:00 2 [IU] via SUBCUTANEOUS
  Administered 2018-11-27: 1 [IU] via SUBCUTANEOUS

## 2018-11-20 MED ORDER — SODIUM CHLORIDE 0.9% FLUSH
3.0000 mL | Freq: Two times a day (BID) | INTRAVENOUS | Status: DC
  Administered 2018-11-21 – 2018-11-26 (×12): 3 mL via INTRAVENOUS

## 2018-11-20 MED ORDER — PANTOPRAZOLE SODIUM 40 MG PO TBEC
40.0000 mg | DELAYED_RELEASE_TABLET | Freq: Every day | ORAL | Status: DC
  Administered 2018-11-21 – 2018-11-26 (×7): 40 mg via ORAL
  Filled 2018-11-20 (×7): qty 1

## 2018-11-20 NOTE — ED Provider Notes (Signed)
[facility] EMERGENCY DEPARTMENT Provider Note   CSN: [id number] Arrival date & time: 11/20/18  1705    History   Chief Complaint Chief Complaint  Patient presents with  . Chest Pain    HPI Brad [name] is a 63 y.o. male.     HPI 63 year old male with extensive past medical history as below including history of anemia, chronic hepatitis C, COPD, diabetes, history of lung cancer, here with multiple complaints.  The patient states that over the last week or so, he has had progressive worsening generalized weakness, shortness of breath, dyspnea with exertion.  He said increased lower extremity swelling.  He has noted some intermittent dull chest pressure when he exerts himself, but this then resolves at rest.  He has had extremely low appetite and energy level.  No fevers.  No chills.  He states he has heart failure and has had similar symptoms, though he feels slightly more tired than usual.  Denies any melena or blood in his stools.  No hematemesis.  No known history of varices.  No recent medication changes.  Past Medical History:  Diagnosis Date  . Anemia    takes Ferrous Sulfated daily  . Arthritis   . Carotid stenosis 08/27/2014   takes Aspirin daily  . Chronic hepatitis C ([place]) 09/17/2015  . COPD (chronic obstructive pulmonary disease) (HCC)    Albuterol daily as needed and Spiriva daily  . Depression   . Diabetes mellitus type II    takes Metformin and Glipizide daily  . Dizziness   . GERD (gastroesophageal reflux disease)   . GI bleed 12/2015  . Headache    occasionally  . History of blood clots    in right leg;being followed by Dr.Brabham for this  . History of blood transfusion 09/2014   2 units-no abnormal reaction noted  . History of gastric ulcer   . History of hiatal hernia   . HTN (hypertension) 09/14/2015  . Hyperlipidemia 08/27/2014   takes Simvastatin daily  . Hypertension    takes Amlodipine and Lisinopril daily  . Hypothyroidism  08/27/2014   takes Synthroid daily  . Insomnia    takes Pamelor nightly  . Joint pain    hips  . Muscle spasm    takes Zanaflex daily as needed  . MVP (mitral valve prolapse)   . Peripheral edema    takes Furosemide daily as needed  . Peripheral neuropathy   . Prurigo nodularis   . Shortness of breath dyspnea   . Stroke ([place])   . Urinary frequency   . Urinary urgency     Patient Active Problem List   Diagnosis Date Noted  . Encounter for antineoplastic chemotherapy 08/27/2017  . Stage III squamous cell carcinoma of left lung ([place]) 06/29/2017  . Mediastinal lymphadenopathy   . Pleural effusion on left   . Lung mass 04/27/2017  . Abnormal chest x-ray 04/13/2017  . Loss of weight 05/10/2016  . Grief reaction 05/10/2016  . Preop exam for internal medicine 05/10/2016  . Liver fibrosis 03/09/2016  . Smoker 01/23/2016  . Peripheral vascular disease ([place]) 01/19/2016  . GI bleed 12/08/2015  . Acute on chronic anemia 12/08/2015  . Diastolic CHF ([place]) 17/61/6073  . Hypersomnolence 12/07/2015  . Carotid stenosis, symptomatic, with infarction ([place]) 11/30/2015  . HLD (hyperlipidemia) 11/12/2015  . S/P carotid endarterectomy 11/12/2015  . Secondary cardiomyopathy ([place]) 10/14/2015  . Acute CVA (cerebrovascular accident) ([place]) 09/28/2015  . Stroke (cerebrum) ([place]) 09/27/2015  . Facial  droop 09/27/2015  . Chronic hepatitis C ([place]) 09/17/2015  . Skin lesion 09/14/2015  . Insect bite, infected 09/14/2015  . Left shoulder pain 06/08/2015  . Hypoalbuminemia 02/17/2015  . Elevated alkaline phosphatase level 02/17/2015  . Essential hypertension 02/16/2015  . Carotid stenosis 11/13/2014  . Atherosclerotic peripheral vascular disease with intermittent claudication ([place])   . Duodenal ulcer disease   . Anemia   . Tobacco abuse 09/23/2014  . Hypothyroidism 08/27/2014  . Bilateral carotid artery stenosis   . Diabetes ([place]) 05/25/2014  . Hypertensive heart disease   . History of CVA  (cerebrovascular accident) without residual deficits   . Obesity (BMI 30-39.9) 01/19/2014  . Erectile dysfunction 10/10/2013  . Chronic LBP   . COPD (chronic obstructive pulmonary disease) ([place]) 03/17/2010  . GERD 03/17/2010    Past Surgical History:  Procedure Laterality Date  . ENDARTERECTOMY Left 11/13/2014   Procedure: ENDARTERECTOMY CAROTID WITH PATCH ANGIOPLASTY;  Surgeon: Serafina Mitchell, MD;  Location: [facility] OR;  Service: Vascular;  Laterality: Left;  . ESOPHAGOGASTRODUODENOSCOPY N/A 09/24/2014   Procedure: ESOPHAGOGASTRODUODENOSCOPY (EGD);  Surgeon: Winfield Cunas., MD;  Location: [facility] ENDOSCOPY;  Service: Endoscopy;  Laterality: N/A;  . FUDUCIAL PLACEMENT N/A 06/26/2017   Procedure: PLACEMENT OF FUDUCIAL;  Surgeon: Grace Isaac, MD;  Location: [facility];  Service: Thoracic;  Laterality: N/A;  . I&D of right arm    . INCISION AND DRAINAGE PERIRECTAL ABSCESS  03/2009  . Neg Stress Test  2000  . PERIPHERAL VASCULAR CATHETERIZATION N/A 11/30/2015   Procedure: Carotid PTA/Stent Intervention;  Surgeon: Serafina Mitchell, MD;  Location: [facility] CV LAB;  Service: Cardiovascular;  Laterality: N/A;  . ROTATOR CUFF REPAIR Right 2009   multiple f/u Sxs/infection Tx  . VASCULAR SURGERY    . VIDEO BRONCHOSCOPY WITH ENDOBRONCHIAL NAVIGATION N/A 05/25/2017   Procedure: VIDEO BRONCHOSCOPY WITH ENDOBRONCHIAL NAVIGATION;  Surgeon: Rigoberto Noel, MD;  Location: [facility];  Service: Thoracic;  Laterality: N/A;  . VIDEO BRONCHOSCOPY WITH ENDOBRONCHIAL NAVIGATION N/A 06/26/2017   Procedure: VIDEO BRONCHOSCOPY WITH ENDOBRONCHIAL NAVIGATION WITH PLACEMENT OF FIDUCIALS;  Surgeon: Grace Isaac, MD;  Location: [facility];  Service: Thoracic;  Laterality: N/A;  . VIDEO BRONCHOSCOPY WITH ENDOBRONCHIAL ULTRASOUND N/A 05/25/2017   Procedure: VIDEO BRONCHOSCOPY WITH ENDOBRONCHIAL ULTRASOUND;  Surgeon: Rigoberto Noel, MD;  Location: [facility];  Service: Thoracic;  Laterality: N/A;        Home Medications     Prior to Admission medications   Medication Sig Start Date End Date Taking? Authorizing Provider  amLODipine (NORVASC) 5 MG tablet Take 1 tablet (5 mg total) daily by mouth. 06/11/17   Biagio Borg, MD  aspirin EC 81 MG tablet Take 1 tablet (81 mg total) by mouth daily. 12/21/15   Biagio Borg, MD  ATROVENT HFA 17 MCG/ACT inhaler INHALE 2 PUFFS INTO THE LUNGS FOUR TIMES DAILY 01/07/18   Biagio Borg, MD  Blood Pressure Monitor DEVI Use to check Blood pressure daily 05/25/14   Biagio Borg, MD  ferrous sulfate 325 (65 FE) MG tablet Take 1 tablet (325 mg total) by mouth 3 (three) times daily with meals. Patient taking differently: Take 325 mg 2 (two) times daily by mouth. Once to twice daily 09/25/14   Regalado, Belkys A, MD  furosemide (LASIX) 80 MG tablet Take 1 tablet (80 mg total) daily by mouth. 06/11/17   Biagio Borg, MD  gabapentin (NEURONTIN) 600 MG tablet TAKE 1 TABLET(600 MG) BY MOUTH THREE TIMES  DAILY Patient taking differently: Take 600 mg 3 (three) times daily as needed by mouth (for nerve pain/neuropathy.). TAKE 1 TABLET(600 MG) BY MOUTH THREE TIMES DAILY 06/11/17   Biagio Borg, MD  glucose blood (ACCU-CHEK AVIVA PLUS) test strip Use to check blood sugars daily Dx E11.9 11/09/15   Biagio Borg, MD  Lancets MISC 1 each by Other route See admin instructions. Reported on 09/07/2015    [provider]  levothyroxine (SYNTHROID, LEVOTHROID) 112 MCG tablet Take 1 tablet (112 mcg total) daily by mouth. 06/11/17   Biagio Borg, MD  lisinopril (PRINIVIL,ZESTRIL) 20 MG tablet Take 1 tablet (20 mg total) daily by mouth. 06/11/17   Biagio Borg, MD  metolazone (ZAROXOLYN) 5 MG tablet Take 1 tablet (5 mg total) daily as needed by mouth. 06/11/17 09/09/17  Biagio Borg, MD  potassium chloride (K-DUR) 10 MEQ tablet TAKE 2 TABLETS BY MOUTH EVERY DAY IN THE MORNING 06/11/17   Biagio Borg, MD  prochlorperazine (COMPAZINE) 10 MG tablet TAKE 1 TABLET(10 MG) BY MOUTH EVERY 6 HOURS AS NEEDED FOR  NAUSEA OR VOMITING 06/29/17   Curt Bears, MD  Respiratory Therapy Supplies (NEBULIZER) DEVI Use as directed four times daily 09/14/15   Biagio Borg, MD  tiZANidine (ZANAFLEX) 4 MG tablet Take 1 tablet (4 mg total) every 6 (six) hours as needed by mouth for muscle spasms. 06/11/17   Biagio Borg, MD  umeclidinium-vilanterol (ANORO ELLIPTA) 62.5-25 MCG/INH AEPB Inhale 1 puff into the lungs daily. 05/30/17   Rigoberto Noel, MD    Family History Family History  Problem Relation Age of Onset  . Heart disease Mother        automated implantable cardioverter-defibrillator   . Diabetes type II Mother   . Alcohol abuse Father   . Lung cancer Father   . Lung cancer Maternal Grandmother   . Lung cancer Sister     Social History Social History   Tobacco Use  . Smoking status: Current Every Day Smoker    Packs/day: 1.00    Years: 48.00    Pack years: 48.00    Types: Cigarettes  . Smokeless tobacco: Never Used  . Tobacco comment: 1-2 pks per day  Substance Use Topics  . Alcohol use: Yes    Alcohol/week: 1.0 standard drinks    Types: 1 Cans of beer per week    Comment: rarely  . Drug use: No     Allergies   Doxycycline; Lipitor [atorvastatin]; and Oxycodone   Review of Systems Review of Systems  Constitutional: Positive for fatigue. Negative for chills and fever.  HENT: Negative for congestion and rhinorrhea.   Eyes: Negative for visual disturbance.  Respiratory: Positive for cough and shortness of breath. Negative for wheezing.   Cardiovascular: Positive for leg swelling. Negative for chest pain.  Gastrointestinal: Negative for abdominal pain, diarrhea, nausea and vomiting.  Genitourinary: Negative for dysuria and flank pain.  Musculoskeletal: Negative for neck pain and neck stiffness.  Skin: Negative for rash and wound.  Allergic/Immunologic: Negative for immunocompromised state.  Neurological: Positive for weakness. Negative for syncope and headaches.  All other  systems reviewed and are negative.    Physical Exam Updated Vital Signs BP (!) 162/77   Pulse 67   Temp 97.8 F (36.6 C) (Rectal)   Resp (!) 23   Ht 5\' 11"  (1.803 m)   SpO2 96%   BMI 32.52 kg/m   Physical Exam Vitals signs and nursing note reviewed.  Constitutional:      General: He is not in acute distress.    Appearance: He is well-developed.  HENT:     Head: Normocephalic and atraumatic.  Eyes:     Conjunctiva/sclera: Conjunctivae normal.  Neck:     Musculoskeletal: Neck supple.  Cardiovascular:     Rate and Rhythm: Normal rate and regular rhythm.     Heart sounds: Normal heart sounds. No murmur. No friction rub.  Pulmonary:     Effort: Pulmonary effort is normal. No respiratory distress.     Breath sounds: Examination of the right-middle field reveals rales. Examination of the left-middle field reveals rales. Examination of the right-lower field reveals rales. Examination of the left-lower field reveals rales. Rales present. No wheezing.  Abdominal:     General: There is no distension.     Palpations: Abdomen is soft.     Tenderness: There is no abdominal tenderness.  Musculoskeletal:     Right lower leg: Edema (3+ pitting) present.     Left lower leg: Edema (3+ pitting) present.  Skin:    General: Skin is warm.     Capillary Refill: Capillary refill takes less than 2 seconds.  Neurological:     Mental Status: He is alert and oriented to person, place, and time.     Motor: No abnormal muscle tone.      ED Treatments / Results  Labs (all labs ordered are listed, but only abnormal results are displayed) Labs Reviewed  BASIC METABOLIC PANEL - Abnormal; Notable for the following components:      Result Value   Glucose, Bld 123 (*)    Calcium 8.2 (*)    All other components within normal limits  CBC - Abnormal; Notable for the following components:   RBC 3.38 (*)    Hemoglobin 7.2 (*)    HCT 26.4 (*)    MCV 78.1 (*)    MCH 21.3 (*)    MCHC 27.3 (*)     All other components within normal limits  TROPONIN I - Abnormal; Notable for the following components:   Troponin I 0.04 (*)    All other components within normal limits  BRAIN NATRIURETIC PEPTIDE - Abnormal; Notable for the following components:   B Natriuretic Peptide 1,440.6 (*)    All other components within normal limits  TROPONIN I - Abnormal; Notable for the following components:   Troponin I 0.04 (*)    All other components within normal limits  TSH - Abnormal; Notable for the following components:   TSH 13.206 (*)    All other components within normal limits  FERRITIN  HIV ANTIBODY (ROUTINE TESTING W REFLEX)  COMPREHENSIVE METABOLIC PANEL  CBC  HEMOGLOBIN A1C  POC OCCULT BLOOD, ED  TYPE AND SCREEN    EKG EKG Interpretation  Date/Time:  Wednesday November 20 2018 17:13:32 EDT Ventricular Rate:  71 PR Interval:  170 QRS Duration: 118 QT Interval:  428 QTC Calculation: 465 R Axis:   -26 Text Interpretation:  Sinus rhythm with Premature atrial complexes Left ventricular hypertrophy with QRS widening and repolarization abnormality Abnormal ECG Baseline artifact No significant change since last tracing Confirmed by Duffy Bruce [phone]) on 11/20/2018 6:30:41 PM   Radiology Dg Chest 2 View  Result Date: 11/20/2018 CLINICAL DATA:  Chest pain EXAM: CHEST - 2 VIEW COMPARISON:  06/26/2017 FINDINGS: Large left pleural effusion with basilar and apical components. Approximately 50% volume loss within the left lung. Left chest surgical clips. Right lung is clear. IMPRESSION:  Large left pleural effusion with basilar and apical components with moderate left lung volume loss. Electronically Signed   By: Ulyses Jarred M.D.   On: 11/20/2018 17:51    Procedures Procedures (including critical care time)  Medications Ordered in ED Medications  sodium chloride flush (NS) 0.9 % injection 3 mL (has no administration in time range)  acetaminophen (TYLENOL) tablet 650 mg (has no  administration in time range)    Or  acetaminophen (TYLENOL) suppository 650 mg (has no administration in time range)  furosemide (LASIX) injection 40 mg (has no administration in time range)  potassium chloride 20 MEQ/15ML (10%) solution 20 mEq (has no administration in time range)  insulin aspart (novoLOG) injection 0-9 Units (has no administration in time range)  ipratropium (ATROVENT HFA) inhaler 2 puff (has no administration in time range)  ferrous sulfate tablet 325 mg (has no administration in time range)  levothyroxine (SYNTHROID, LEVOTHROID) tablet 112 mcg (has no administration in time range)  lisinopril (PRINIVIL,ZESTRIL) tablet 20 mg (has no administration in time range)  umeclidinium-vilanterol (ANORO ELLIPTA) 62.5-25 MCG/INH 1 puff (has no administration in time range)  amLODipine (NORVASC) tablet 5 mg (has no administration in time range)  pantoprazole (PROTONIX) EC tablet 40 mg (has no administration in time range)  nicotine (NICODERM CQ - dosed in mg/24 hours) patch 21 mg (has no administration in time range)  sodium chloride flush (NS) 0.9 % injection 3 mL (3 mLs Intravenous Given 11/20/18 2128)  furosemide (LASIX) injection 40 mg (40 mg Intravenous Given 11/20/18 2126)     Initial Impression / Assessment and Plan / ED Course  I have reviewed the triage vital signs and the nursing notes.  Pertinent labs & imaging results that were available during my care of the patient were reviewed by me and considered in my medical decision making (see chart for details).        63 year old male with past medical history as above here with generalized weakness, dyspnea with exertion.  Suspect this is multifactorial, secondary to acute on chronic anemia as well as CHF.  He will likely need transfusion but given current pandemic and blood shortages, he does not meet the criteria of hemoglobin less than 7.  Will diurese and plan to trend as an inpatient.  Troponin elevated which I suspect  is demand related and EKG is nonischemic without signs of ST elevation.  Final Clinical Impressions(s) / ED Diagnoses   Final diagnoses:  Acute on chronic systolic congestive heart failure (HCC)  Symptomatic anemia    ED Discharge Orders    None       Duffy Bruce, MD 11/21/18 [phone]

## 2018-11-20 NOTE — ED Triage Notes (Signed)
Pt with intermittent chest pain, frequent in nature reports it has been going on for years. Pt is custody with [employer], clinical (histocompatibility and immunogenetics). Denies pain at this time.

## 2018-11-20 NOTE — H&P (Signed)
History and Physical    Brad [name] QAS:[id number] DOB: [birth year] DOA: 11/20/2018  PCP: Biagio Borg, MD  Patient coming from: [facility]  I have personally briefly reviewed patient's old medical records in [place]  Chief Complaint: Dyspnea on exertion  HPI: Brad [name] is a 63 y.o. male with medical history significant for COPD, Stage III Squamous Cell Cancer of Left Upper Lobe (s/p radiation, incomplete chemotherapy), CHF (EF 45-50%, G2DD by TTE 09/2015), T2DM, HTN, PVD, Chronic Hepatitis C s/p Harvoni, Anemia, Hx of CVA, Hx of Duodenal Ulcers, Hx or RLE DVT no longer on anticoagulation who presents to the ED from [facility] with progressive dyspnea on exertion and lower extremity edema.    Patient states that he has not been taking any medications for about 6 months now.  He was incarcerated at [facility] jail on 08/21/2018 and since that time has not been given any medications.  Patient says that he has developed dyspnea on exertion and progressive swelling of both of his legs ongoing over an unspecified period of time.  He denies any associated orthopnea, chest pain, palpitations, abdominal pain, dysuria, nausea, vomiting, fevers, chills, or diaphoresis.  He says he is supposed to be taking a low-dose aspirin daily however has not been taking it and denies any NSAID use recently.  He denies any obvious bleeding including epistaxis, hemoptysis, hematemesis, hematuria, hematochezia, or melena.  ED Course:  Initial vitals in the ED showed BP 194/87, pulse 72, RR 17, temp 97.6 Fahrenheit, SPO2 97% on room air.  Labs are notable for WBC 6.7, hemoglobin 7.2 (11.3 on 10/01/2017), MCV 78.9, platelets 294, sodium 138, potassium 3.6, BUN 13, creatinine 0.88, serum glucose 123, troponin I 0.04, BNP 1440.06, FOBT negative.  2 view chest x-ray showed a large left pleural effusion with moderate left lung volume loss.  Patient was given IV Lasix 40 mg once  and the hospital service was consulted to admit for further evaluation and management of volume overload and anemia.  Review of Systems: As per HPI otherwise 10 point review of systems negative.    Past Medical History:  Diagnosis Date   Anemia    takes Ferrous Sulfated daily   Arthritis    Carotid stenosis 08/27/2014   takes Aspirin daily   Chronic hepatitis C ([place]) 09/17/2015   COPD (chronic obstructive pulmonary disease) (HCC)    Albuterol daily as needed and Spiriva daily   Depression    Diabetes mellitus type II    takes Metformin and Glipizide daily   Dizziness    GERD (gastroesophageal reflux disease)    GI bleed 12/2015   Headache    occasionally   History of blood clots    in right leg;being followed by Dr.Brabham for this   History of blood transfusion 09/2014   2 units-no abnormal reaction noted   History of gastric ulcer    History of hiatal hernia    HTN (hypertension) 09/14/2015   Hyperlipidemia 08/27/2014   takes Simvastatin daily   Hypertension    takes Amlodipine and Lisinopril daily   Hypothyroidism 08/27/2014   takes Synthroid daily   Insomnia    takes Pamelor nightly   Joint pain    hips   Muscle spasm    takes Zanaflex daily as needed   MVP (mitral valve prolapse)    Peripheral edema    takes Furosemide daily as needed   Peripheral neuropathy    Prurigo nodularis  Shortness of breath dyspnea    Stroke [facility])    Urinary frequency    Urinary urgency     Past Surgical History:  Procedure Laterality Date   ENDARTERECTOMY Left 11/13/2014   Procedure: ENDARTERECTOMY CAROTID WITH PATCH ANGIOPLASTY;  Surgeon: Serafina Mitchell, MD;  Location: [facility] OR;  Service: Vascular;  Laterality: Left;   ESOPHAGOGASTRODUODENOSCOPY N/A 09/24/2014   Procedure: ESOPHAGOGASTRODUODENOSCOPY (EGD);  Surgeon: Winfield Cunas., MD;  Location: [facility] ENDOSCOPY;  Service: Endoscopy;  Laterality: N/A;   FUDUCIAL PLACEMENT N/A 06/26/2017   Procedure:  PLACEMENT OF FUDUCIAL;  Surgeon: Grace Isaac, MD;  Location: [place];  Service: Thoracic;  Laterality: N/A;   I&D of right arm     INCISION AND DRAINAGE PERIRECTAL ABSCESS  03/2009   Neg Stress Test  2000   PERIPHERAL VASCULAR CATHETERIZATION N/A 11/30/2015   Procedure: Carotid PTA/Stent Intervention;  Surgeon: Serafina Mitchell, MD;  Location: [place] CV LAB;  Service: Cardiovascular;  Laterality: N/A;   ROTATOR CUFF REPAIR Right 2009   multiple f/u Sxs/infection Tx   VASCULAR SURGERY     VIDEO BRONCHOSCOPY WITH ENDOBRONCHIAL NAVIGATION N/A 05/25/2017   Procedure: VIDEO BRONCHOSCOPY WITH ENDOBRONCHIAL NAVIGATION;  Surgeon: Rigoberto Noel, MD;  Location: [place] OR;  Service: Thoracic;  Laterality: N/A;   VIDEO BRONCHOSCOPY WITH ENDOBRONCHIAL NAVIGATION N/A 06/26/2017   Procedure: VIDEO BRONCHOSCOPY WITH ENDOBRONCHIAL NAVIGATION WITH PLACEMENT OF FIDUCIALS;  Surgeon: Grace Isaac, MD;  Location: [place];  Service: Thoracic;  Laterality: N/A;   VIDEO BRONCHOSCOPY WITH ENDOBRONCHIAL ULTRASOUND N/A 05/25/2017   Procedure: VIDEO BRONCHOSCOPY WITH ENDOBRONCHIAL ULTRASOUND;  Surgeon: Rigoberto Noel, MD;  Location: [place];  Service: Thoracic;  Laterality: N/A;     reports that he has been smoking cigarettes. He has a 48.00 pack-year smoking history. He has never used smokeless tobacco. He reports current alcohol use of about 1.0 standard drinks of alcohol per week. He reports that he does not use drugs.  Allergies  Allergen Reactions   Doxycycline Hives and Itching   Lipitor [Atorvastatin] Other (See Comments)    Myalgia    Oxycodone Itching    Family History  Problem Relation Age of Onset   Heart disease Mother        automated implantable cardioverter-defibrillator    Diabetes type II Mother    Alcohol abuse Father    Lung cancer Father    Lung cancer Maternal Grandmother    Lung cancer Sister      Prior to Admission medications   Medication Sig Start Date  End Date Taking? Authorizing Provider  amLODipine (NORVASC) 5 MG tablet Take 1 tablet (5 mg total) daily by mouth. 06/11/17   Biagio Borg, MD  aspirin EC 81 MG tablet Take 1 tablet (81 mg total) by mouth daily. 12/21/15   Biagio Borg, MD  ATROVENT HFA 17 MCG/ACT inhaler INHALE 2 PUFFS INTO THE LUNGS FOUR TIMES DAILY 01/07/18   Biagio Borg, MD  Blood Pressure Monitor DEVI Use to check Blood pressure daily 05/25/14   Biagio Borg, MD  ferrous sulfate 325 (65 FE) MG tablet Take 1 tablet (325 mg total) by mouth 3 (three) times daily with meals. Patient taking differently: Take 325 mg 2 (two) times daily by mouth. Once to twice daily 09/25/14   Regalado, Belkys A, MD  furosemide (LASIX) 80 MG tablet Take 1 tablet (80 mg total) daily by mouth. 06/11/17   Biagio Borg, MD  gabapentin (NEURONTIN) 600 MG  tablet TAKE 1 TABLET(600 MG) BY MOUTH THREE TIMES DAILY Patient taking differently: Take 600 mg 3 (three) times daily as needed by mouth (for nerve pain/neuropathy.). TAKE 1 TABLET(600 MG) BY MOUTH THREE TIMES DAILY 06/11/17   Biagio Borg, MD  glucose blood (ACCU-CHEK AVIVA PLUS) test strip Use to check blood sugars daily Dx E11.9 11/09/15   Biagio Borg, MD  Lancets MISC 1 each by Other route See admin instructions. Reported on 09/07/2015    [provider]  levothyroxine (SYNTHROID, LEVOTHROID) 112 MCG tablet Take 1 tablet (112 mcg total) daily by mouth. 06/11/17   Biagio Borg, MD  lisinopril (PRINIVIL,ZESTRIL) 20 MG tablet Take 1 tablet (20 mg total) daily by mouth. 06/11/17   Biagio Borg, MD  metolazone (ZAROXOLYN) 5 MG tablet Take 1 tablet (5 mg total) daily as needed by mouth. 06/11/17 09/09/17  Biagio Borg, MD  potassium chloride (K-DUR) 10 MEQ tablet TAKE 2 TABLETS BY MOUTH EVERY DAY IN THE MORNING 06/11/17   Biagio Borg, MD  prochlorperazine (COMPAZINE) 10 MG tablet TAKE 1 TABLET(10 MG) BY MOUTH EVERY 6 HOURS AS NEEDED FOR NAUSEA OR VOMITING 06/29/17   Curt Bears, MD  Respiratory  Therapy Supplies (NEBULIZER) DEVI Use as directed four times daily 09/14/15   Biagio Borg, MD  tiZANidine (ZANAFLEX) 4 MG tablet Take 1 tablet (4 mg total) every 6 (six) hours as needed by mouth for muscle spasms. 06/11/17   Biagio Borg, MD  umeclidinium-vilanterol (ANORO ELLIPTA) 62.5-25 MCG/INH AEPB Inhale 1 puff into the lungs daily. 05/30/17   Rigoberto Noel, MD    Physical Exam: Vitals:   11/20/18 1915 11/20/18 2015 11/20/18 2039 11/20/18 2215  BP: (!) 217/87 (!) 200/77  (!) 162/77  Pulse: 71 69  67  Resp: (!) 27 (!) 26  (!) 23  Temp:   97.8 F (36.6 C)   TempSrc:   Rectal   SpO2: 100% 100%  96%  Height:        Constitutional: Chronically ill-appearing man sitting up at bedside, NAD, calm, comfortable Eyes: PERRL, lids and conjunctivae normal ENMT: Mucous membranes are dry. Posterior pharynx clear of any exudate or lesions. Neck: normal, supple, no masses. Respiratory: Diminished breath sounds left lung field with bilateral rails, no wheezing.  Normal respiratory effort. No accessory muscle use.  Cardiovascular: Regular rate and rhythm, no murmurs / rubs / gallops.  +3 pitting edema bilateral lower extremities.   Abdomen: no tenderness, no masses palpated. No hepatosplenomegaly. Bowel sounds positive.  Musculoskeletal: no clubbing / cyanosis. No joint deformity upper and lower extremities. Good ROM, no contractures. Normal muscle tone.  Skin: no rashes, lesions, ulcers. No induration Neurologic: CN 2-12 grossly intact. Sensation and strength intact.Marland Kitchen  Psychiatric: Normal judgment and insight. Alert and oriented x 3. Normal mood.    Labs on Admission: I have personally reviewed following labs and imaging studies  CBC: Recent Labs  Lab 11/20/18 1717  WBC 6.7  HGB 7.2*  HCT 26.4*  MCV 78.1*  PLT 269   Basic Metabolic Panel: Recent Labs  Lab 11/20/18 1717  NA 138  K 3.6  CL 100  CO2 28  GLUCOSE 123*  BUN 13  CREATININE 0.88  CALCIUM 8.2*   GFR: CrCl cannot  be calculated (Unknown ideal weight.). Liver Function Tests: No results for input(s): AST, ALT, ALKPHOS, BILITOT, PROT, ALBUMIN in the last 168 hours. No results for input(s): LIPASE, AMYLASE in the last 168 hours. No results for  input(s): AMMONIA in the last 168 hours. Coagulation Profile: No results for input(s): INR, PROTIME in the last 168 hours. Cardiac Enzymes: Recent Labs  Lab 11/20/18 1717  TROPONINI 0.04*   BNP (last 3 results) No results for input(s): PROBNP in the last 8760 hours. HbA1C: No results for input(s): HGBA1C in the last 72 hours. CBG: No results for input(s): GLUCAP in the last 168 hours. Lipid Profile: No results for input(s): CHOL, HDL, LDLCALC, TRIG, CHOLHDL, LDLDIRECT in the last 72 hours. Thyroid Function Tests: No results for input(s): TSH, T4TOTAL, FREET4, T3FREE, THYROIDAB in the last 72 hours. Anemia Panel: No results for input(s): VITAMINB12, FOLATE, FERRITIN, TIBC, IRON, RETICCTPCT in the last 72 hours. Urine analysis:    Component Value Date/Time   COLORURINE YELLOW 05/10/2016 [place] 05/10/2016 1434   LABSPEC 1.015 05/10/2016 1434   PHURINE 6.0 05/10/2016 1434   GLUCOSEU NEGATIVE 05/10/2016 1434   HGBUR MODERATE (A) 05/10/2016 1434   BILIRUBINUR NEGATIVE 05/10/2016 1434   KETONESUR NEGATIVE 05/10/2016 1434   PROTEINUR >300 (A) 11/13/2014 0619   UROBILINOGEN 0.2 05/10/2016 1434   NITRITE NEGATIVE 05/10/2016 1434   LEUKOCYTESUR NEGATIVE 05/10/2016 1434    Radiological Exams on Admission: Dg Chest 2 View  Result Date: 11/20/2018 CLINICAL DATA:  Chest pain EXAM: CHEST - 2 VIEW COMPARISON:  06/26/2017 FINDINGS: Large left pleural effusion with basilar and apical components. Approximately 50% volume loss within the left lung. Left chest surgical clips. Right lung is clear. IMPRESSION: Large left pleural effusion with basilar and apical components with moderate left lung volume loss. Electronically Signed   By: Ulyses Jarred  M.D.   On: 11/20/2018 17:51    EKG: Independently reviewed.  Sinus rhythm with PACs, LVH with borderline widened QRS complex.  Assessment/Plan Principal Problem:   Pleural effusion on left Active Problems:   COPD (chronic obstructive pulmonary disease) (HCC)   History of CVA (cerebrovascular accident) without residual deficits   Diabetes ([place])   Hypothyroidism   Atherosclerotic peripheral vascular disease with intermittent claudication (HCC)   Duodenal ulcer disease   Essential hypertension   Diastolic CHF ([place])   Acute on chronic anemia   Stage III squamous cell carcinoma of left lung (HCC) Brad [name] is a 63 y.o. male with medical history significant for COPD, Stage III Squamous Cell Cancer of Left Upper Lobe (s/p radiation, incomplete chemotherapy), CHF (EF 45-50%, G2DD by TTE 09/2015), T2DM, HTN, PVD, Chronic Hepatitis C s/p Harvoni, Anemia, Hx of CVA, Hx of Duodenal Ulcers, Hx or RLE DVT no longer on anticoagulation admitted with symptomatic large left pleural effusion and anemia.  Large Left Pleural Effusion/Hx of Stage III Squamous Cell Lung Cancer: This is most likely malignancy associated given his history of stage III squamous cell cancer of the left upper lobe.  On review of records he has completed radiation therapy but appears to have not finished his chemotherapy regimen of Taxol and carboplatin.  He has previously followed with oncology, Dr. Curt Bears, but has missed multiple follow-up appointments. -Continue IV Lasix 40 mg twice daily -Consider thoracentesis, discussed with patient however he would like to think about this overnight -Strict I/O's, will make n.p.o. at midnight in case of potential thoracentesis  Acute on Chronic Anemia: Hgb 7.2 on admission without obvious bleeding.  Baseline appears to be between 10-11.  He states he has not taken aspirin or NSAIDs for several months. -Continue to monitor, transfuse for hemoglobin <7.0 or signs/symptoms of  active bleeding -Hold aspirin,  avoid NSAIDs, holding pharmacologic VTE prophylaxis -Start Protonix 40 mg daily  Chronic diastolic CHF: EF 24-81%, Y5TM by TTE 09/2015.  Patient with volume overload on admission, likely progressive over several months as he has not been taking any medications. -Continue IV Lasix 40 mg twice daily -Strict I/O's, daily weights -Monitor renal function and electrolytes  Elevated troponin: Troponin 0.04 on admission, mildly elevated.  Likely from demand ischemia.  He denies any active chest pain. -Repeat troponin and trend if rising  HTN: Patient initially markedly hypertensive on admission, improved with initial IV Lasix. -Continue IV Lasix -Restart previous amlodipine and lisinopril  T2DM: Not on therapy for several months. -Check A1c -Sensitive SSI  Hx of CVA/PVD: -Hold off on restarting aspirin given anemia  Hx of Duodenal Ulcers: Seen on upper endoscopy in February 2016, not related to NSAID use.  Worsening anemia as above, patient denies any obvious bleeding. -Start oral Protonix -Monitor hemoglobin and for signs/symptoms of bleeding  COPD: Chronic and stable. -Restart Atrovent and Anoro Ellipta  Hypothyroidism: -Check TSH -Restart levothyroxine 112 mcg, if TSH normal can likely discontinue  Tobacco use: Patient reports smoking 1 PPD for many years. -Nicotine patch ordered  DVT prophylaxis: SCDs  Code Status: Full code, confirmed with patient Family Communication: None present on admission Disposition Plan: Pending clinical progress Consults called: None Admission status: Inpatient   Zada Finders MD [facility] Pager [phone]  If 7PM-7AM, please contact night-coverage [email or web address]  11/20/2018, 11:08 PM

## 2018-11-20 NOTE — ED Notes (Signed)
ED TO INPATIENT HANDOFF REPORT  ED Nurse Name and Phone #: Herbert Spires [phone]  S Name/Age/Gender Brad [name] 63 y.o. male Room/Bed: 030C/030C  Code Status   Code Status: Full Code  Home/SNF/Other Home Patient oriented to: self Is this baseline? Yes   Triage Complete: Triage complete  Chief Complaint Chest Pain; SOB; Confusion  Triage Note Pt with intermittent chest pain, frequent in nature reports it has been going on for years. Pt is custody with [employer], clinical (histocompatibility and immunogenetics). Denies pain at this time.    Allergies Allergies  Allergen Reactions  . Doxycycline Hives and Itching  . Lipitor [Atorvastatin] Other (See Comments)    Myalgia   . Oxycodone Itching    Level of Care/Admitting Diagnosis ED Disposition    ED Disposition Condition Comment   Admit  Hospital Area: [place] [[id number]]  Level of Care: Telemetry Cardiac [103]  Diagnosis: Pleural effusion on left [[id number]]  Admitting Physician: Lenore Cordia [[id number]]  Attending Physician: Lenore Cordia [[id number]]  Estimated length of stay: past midnight tomorrow  Certification:: I certify this patient will need inpatient services for at least 2 midnights  Possible Covid Disease Patient Isolation: N/A  PT Class (Do Not Modify): Inpatient [101]  PT Acc Code (Do Not Modify): Private [1]       B Medical/Surgery History Past Medical History:  Diagnosis Date  . Anemia    takes Ferrous Sulfated daily  . Arthritis   . Carotid stenosis 08/27/2014   takes Aspirin daily  . Chronic hepatitis C ([place]) 09/17/2015  . COPD (chronic obstructive pulmonary disease) (HCC)    Albuterol daily as needed and Spiriva daily  . Depression   . Diabetes mellitus type II    takes Metformin and Glipizide daily  . Dizziness   . GERD (gastroesophageal reflux disease)   . GI bleed 12/2015  . Headache    occasionally  . History of blood clots    in right leg;being followed by Dr.Brabham for this  . History of blood  transfusion 09/2014   2 units-no abnormal reaction noted  . History of gastric ulcer   . History of hiatal hernia   . HTN (hypertension) 09/14/2015  . Hyperlipidemia 08/27/2014   takes Simvastatin daily  . Hypertension    takes Amlodipine and Lisinopril daily  . Hypothyroidism 08/27/2014   takes Synthroid daily  . Insomnia    takes Pamelor nightly  . Joint pain    hips  . Muscle spasm    takes Zanaflex daily as needed  . MVP (mitral valve prolapse)   . Peripheral edema    takes Furosemide daily as needed  . Peripheral neuropathy   . Prurigo nodularis   . Shortness of breath dyspnea   . Stroke ([place])   . Urinary frequency   . Urinary urgency    Past Surgical History:  Procedure Laterality Date  . ENDARTERECTOMY Left 11/13/2014   Procedure: ENDARTERECTOMY CAROTID WITH PATCH ANGIOPLASTY;  Surgeon: Serafina Mitchell, MD;  Location: [facility] OR;  Service: Vascular;  Laterality: Left;  . ESOPHAGOGASTRODUODENOSCOPY N/A 09/24/2014   Procedure: ESOPHAGOGASTRODUODENOSCOPY (EGD);  Surgeon: Winfield Cunas., MD;  Location: [facility] ENDOSCOPY;  Service: Endoscopy;  Laterality: N/A;  . FUDUCIAL PLACEMENT N/A 06/26/2017   Procedure: PLACEMENT OF FUDUCIAL;  Surgeon: Grace Isaac, MD;  Location: [place];  Service: Thoracic;  Laterality: N/A;  . I&D of right arm    . INCISION AND DRAINAGE PERIRECTAL ABSCESS  03/2009  . Neg Stress  Test  2000  . PERIPHERAL VASCULAR CATHETERIZATION N/A 11/30/2015   Procedure: Carotid PTA/Stent Intervention;  Surgeon: Serafina Mitchell, MD;  Location: [place] CV LAB;  Service: Cardiovascular;  Laterality: N/A;  . ROTATOR CUFF REPAIR Right 2009   multiple f/u Sxs/infection Tx  . VASCULAR SURGERY    . VIDEO BRONCHOSCOPY WITH ENDOBRONCHIAL NAVIGATION N/A 05/25/2017   Procedure: VIDEO BRONCHOSCOPY WITH ENDOBRONCHIAL NAVIGATION;  Surgeon: Rigoberto Noel, MD;  Location: [place];  Service: Thoracic;  Laterality: N/A;  . VIDEO BRONCHOSCOPY WITH ENDOBRONCHIAL NAVIGATION N/A 06/26/2017    Procedure: VIDEO BRONCHOSCOPY WITH ENDOBRONCHIAL NAVIGATION WITH PLACEMENT OF FIDUCIALS;  Surgeon: Grace Isaac, MD;  Location: [place];  Service: Thoracic;  Laterality: N/A;  . VIDEO BRONCHOSCOPY WITH ENDOBRONCHIAL ULTRASOUND N/A 05/25/2017   Procedure: VIDEO BRONCHOSCOPY WITH ENDOBRONCHIAL ULTRASOUND;  Surgeon: Rigoberto Noel, MD;  Location: [place];  Service: Thoracic;  Laterality: N/A;     A IV Location/Drains/Wounds Patient Lines/Drains/Airways Status   Active Line/Drains/Airways    Name:   Placement date:   Placement time:   Site:   Days:   Peripheral IV 11/20/18 Left Antecubital   11/20/18    1858    Antecubital   less than 1   Incision (Closed) 11/13/14 Neck Left   11/13/14    0723     1468   Incision (Closed) 05/25/17 N/A Other (Comment)   05/25/17    1500     544          Intake/Output Last 24 hours No intake or output data in the 24 hours ending 11/20/18 2229  Labs/Imaging Results for orders placed or performed during the hospital encounter of 11/20/18 (from the past 48 hour(s))  Basic metabolic panel     Status: Abnormal   Collection Time: 11/20/18  5:17 PM  Result Value Ref Range   Sodium 138 135 - 145 mmol/L   Potassium 3.6 3.5 - 5.1 mmol/L   Chloride 100 98 - 111 mmol/L   CO2 28 22 - 32 mmol/L   Glucose, Bld 123 (H) 70 - 99 mg/dL   BUN 13 8 - 23 mg/dL   Creatinine, Ser 0.88 0.61 - 1.24 mg/dL   Calcium 8.2 (L) 8.9 - 10.3 mg/dL   GFR calc non Af Amer >60 >60 mL/min   GFR calc Af Amer >60 >60 mL/min   Anion gap 10 5 - 15    Comment: Performed at [facility], [address], [place], [place] [address]  CBC     Status: Abnormal   Collection Time: 11/20/18  5:17 PM  Result Value Ref Range   WBC 6.7 4.0 - 10.5 K/uL   RBC 3.38 (L) 4.22 - 5.81 MIL/uL   Hemoglobin 7.2 (L) 13.0 - 17.0 g/dL   HCT 26.4 (L) 39.0 - 52.0 %   MCV 78.1 (L) 80.0 - 100.0 fL   MCH 21.3 (L) 26.0 - 34.0 pg   MCHC 27.3 (L) 30.0 - 36.0 g/dL   RDW 15.5 11.5 - 15.5 %   Platelets 294  150 - 400 K/uL   nRBC 0.0 0.0 - 0.2 %    Comment: Performed at [facility], [place] [address], [place], [place] [address]  Troponin I - ONCE - STAT     Status: Abnormal   Collection Time: 11/20/18  5:17 PM  Result Value Ref Range   Troponin I 0.04 (HH) <0.03 ng/mL    Comment: CRITICAL RESULT CALLED TO, READ BACK BY  AND VERIFIED WITHOrson Ape [id number] 11/20/2018 D BRADLEY Performed at [facility] [address], [place], [place] [address]   Type and screen [place]     Status: None   Collection Time: 11/20/18  8:33 PM  Result Value Ref Range   ABO/RH(D) B POS    Antibody Screen NEG    Sample Expiration      11/23/2018 Performed at [facility], [place] [address] [place], [place] [address]   Brain natriuretic peptide     Status: Abnormal   Collection Time: 11/20/18  8:34 PM  Result Value Ref Range   B Natriuretic Peptide 1,440.6 (H) 0.0 - 100.0 pg/mL    Comment: Performed at [facility] [address] [place], [place] [address]  POC occult blood, ED RN will collect     Status: None   Collection Time: 11/20/18  8:41 PM  Result Value Ref Range   Fecal Occult Bld NEGATIVE NEGATIVE   Dg Chest 2 View  Result Date: 11/20/2018 CLINICAL DATA:  Chest pain EXAM: CHEST - 2 VIEW COMPARISON:  06/26/2017 FINDINGS: Large left pleural effusion with basilar and apical components. Approximately 50% volume loss within the left lung. Left chest surgical clips. Right lung is clear. IMPRESSION: Large left pleural effusion with basilar and apical components with moderate left lung volume loss. Electronically Signed   By: Ulyses Jarred M.D.   On: 11/20/2018 17:51    Pending Labs Unresulted Labs (From admission, onward)    Start     Ordered   11/21/18 0500  Comprehensive metabolic panel  Tomorrow morning,   R     11/20/18 2224   11/21/18 0500  CBC  Tomorrow morning,   R     11/20/18 2224   11/21/18 0000  Troponin I - Once  Once,   R     11/20/18 2228    11/20/18 2230  Ferritin  Add-on,   R     11/20/18 2229   11/20/18 2229  TSH  Add-on,   R     11/20/18 2228   11/20/18 2226  Hemoglobin A1c  Add-on,   R    Comments:  To assess prior glycemic control    11/20/18 2225   11/20/18 2223  HIV antibody (Routine Testing)  Add-on,   R     11/20/18 2224          Vitals/Pain Today's Vitals   11/20/18 1845 11/20/18 1915 11/20/18 2015 11/20/18 2039  BP: (!) 209/85 (!) 217/87 (!) 200/77   Pulse: 68 71 69   Resp: (!) 25 (!) 27 (!) 26   Temp:    97.8 F (36.6 C)  TempSrc:    Rectal  SpO2: 100% 100% 100%   Height:      PainSc:        Isolation Precautions No active isolations  Medications Medications  sodium chloride flush (NS) 0.9 % injection 3 mL (has no administration in time range)  acetaminophen (TYLENOL) tablet 650 mg (has no administration in time range)    Or  acetaminophen (TYLENOL) suppository 650 mg (has no administration in time range)  furosemide (LASIX) injection 40 mg (has no administration in time range)  potassium chloride 20 MEQ/15ML (10%) solution 20 mEq (has no administration in time range)  insulin aspart (novoLOG) injection 0-9 Units (has no administration in time range)  ipratropium (ATROVENT HFA) inhaler 2 puff (has no administration in time range)  ferrous sulfate tablet  325 mg (has no administration in time range)  levothyroxine (SYNTHROID, LEVOTHROID) tablet 112 mcg (has no administration in time range)  lisinopril (PRINIVIL,ZESTRIL) tablet 20 mg (has no administration in time range)  umeclidinium-vilanterol (ANORO ELLIPTA) 62.5-25 MCG/INH 1 puff (has no administration in time range)  amLODipine (NORVASC) tablet 5 mg (has no administration in time range)  pantoprazole (PROTONIX) EC tablet 40 mg (has no administration in time range)  sodium chloride flush (NS) 0.9 % injection 3 mL (3 mLs Intravenous Given 11/20/18 2128)  furosemide (LASIX) injection 40 mg (40 mg Intravenous Given 11/20/18 2126)     Mobility walks Low fall risk   Focused Assessments Cardiac Assessment Handoff:    Lab Results  Component Value Date   TROPONINI 0.04 ([place]) 11/20/2018   No results found for: DDIMER Does the Patient currently have chest pain? No     R Recommendations: See Admitting Provider Note  Report given to:   Additional Notes:

## 2018-11-21 ENCOUNTER — Inpatient Hospital Stay (HOSPITAL_COMMUNITY): Payer: Medicare Other

## 2018-11-21 ENCOUNTER — Encounter (HOSPITAL_COMMUNITY): Payer: Self-pay | Admitting: Student

## 2018-11-21 DIAGNOSIS — C3492 Malignant neoplasm of unspecified part of left bronchus or lung: Principal | ICD-10-CM

## 2018-11-21 DIAGNOSIS — D649 Anemia, unspecified: Secondary | ICD-10-CM

## 2018-11-21 DIAGNOSIS — I10 Essential (primary) hypertension: Secondary | ICD-10-CM

## 2018-11-21 DIAGNOSIS — I509 Heart failure, unspecified: Secondary | ICD-10-CM

## 2018-11-21 DIAGNOSIS — I5043 Acute on chronic combined systolic (congestive) and diastolic (congestive) heart failure: Secondary | ICD-10-CM

## 2018-11-21 DIAGNOSIS — E039 Hypothyroidism, unspecified: Secondary | ICD-10-CM

## 2018-11-21 DIAGNOSIS — R0609 Other forms of dyspnea: Secondary | ICD-10-CM

## 2018-11-21 HISTORY — PX: IR THORACENTESIS ASP PLEURAL SPACE W/IMG GUIDE: IMG5380

## 2018-11-21 LAB — COMPREHENSIVE METABOLIC PANEL
ALT: 11 U/L (ref 0–44)
AST: 15 U/L (ref 15–41)
Albumin: 1.5 g/dL — ABNORMAL LOW (ref 3.5–5.0)
Alkaline Phosphatase: 118 U/L (ref 38–126)
Anion gap: 9 (ref 5–15)
BUN: 13 mg/dL (ref 8–23)
CO2: 31 mmol/L (ref 22–32)
Calcium: 8.1 mg/dL — ABNORMAL LOW (ref 8.9–10.3)
Chloride: 99 mmol/L (ref 98–111)
Creatinine, Ser: 0.91 mg/dL (ref 0.61–1.24)
GFR calc Af Amer: >60 mL/min (ref >60)
GFR calc non Af Amer: >60 mL/min (ref >60)
Glucose, Bld: 126 mg/dL — ABNORMAL HIGH (ref 70–99)
Potassium: 3.4 mmol/L — ABNORMAL LOW (ref 3.5–5.1)
Sodium: 139 mmol/L (ref 135–145)
Total Bilirubin: 0.6 mg/dL (ref 0.3–1.2)
Total Protein: 5.9 g/dL — ABNORMAL LOW (ref 6.5–8.1)

## 2018-11-21 LAB — GLUCOSE, CAPILLARY
Glucose-Capillary: 90 mg/dL (ref 70–99)
Glucose-Capillary: 113 mg/dL — ABNORMAL HIGH (ref 70–99)
Glucose-Capillary: 161 mg/dL — ABNORMAL HIGH (ref 70–99)
Glucose-Capillary: 152 mg/dL — ABNORMAL HIGH (ref 70–99)

## 2018-11-21 LAB — ECHOCARDIOGRAM COMPLETE
Height: 71 in
Weight: 3662.4 oz

## 2018-11-21 LAB — BODY FLUID CELL COUNT WITH DIFFERENTIAL
Lymphs, Fluid: 57 %
Monocyte-Macrophage-Serous Fluid: 23 % — ABNORMAL LOW (ref 50–90)
Neutrophil Count, Fluid: 20 % (ref 0–25)
Total Nucleated Cell Count, Fluid: 52 cu mm (ref 0–1000)

## 2018-11-21 LAB — FERRITIN: Ferritin: 63 ng/mL (ref 24–336)

## 2018-11-21 LAB — IRON AND TIBC
Iron: 14 ug/dL — ABNORMAL LOW (ref 45–182)
Saturation Ratios: 6 % — ABNORMAL LOW (ref 17.9–39.5)
TIBC: 244 ug/dL — ABNORMAL LOW (ref 250–450)
UIBC: 230 ug/dL

## 2018-11-21 LAB — URINALYSIS, DIPSTICK ONLY
Bilirubin Urine: NEGATIVE
Glucose, UA: NEGATIVE mg/dL
Hgb urine dipstick: NEGATIVE
Ketones, ur: NEGATIVE mg/dL
Leukocytes,Ua: NEGATIVE
Nitrite: NEGATIVE
Protein, ur: 30 mg/dL — AB
Specific Gravity, Urine: 1.005 (ref 1.005–1.030)
pH: 7 (ref 5.0–8.0)

## 2018-11-21 LAB — GRAM STAIN

## 2018-11-21 LAB — FOLATE: Folate: 12.5 ng/mL (ref >5.9)

## 2018-11-21 LAB — MRSA PCR SCREENING
MRSA by PCR: NEGATIVE
MRSA by PCR: NEGATIVE

## 2018-11-21 LAB — LIPID PANEL
Cholesterol: 90 mg/dL (ref 0–200)
HDL: 28 mg/dL — ABNORMAL LOW (ref >40)
LDL Cholesterol: 53 mg/dL (ref 0–99)
Total CHOL/HDL Ratio: 3.2 RATIO
Triglycerides: 45 mg/dL (ref <150)
VLDL: 9 mg/dL (ref 0–40)

## 2018-11-21 LAB — CBC
HCT: 23.8 % — ABNORMAL LOW (ref 39.0–52.0)
Hemoglobin: 6.6 g/dL — CL (ref 13.0–17.0)
MCH: 21.4 pg — ABNORMAL LOW (ref 26.0–34.0)
MCHC: 27.7 g/dL — ABNORMAL LOW (ref 30.0–36.0)
MCV: 77.3 fL — ABNORMAL LOW (ref 80.0–100.0)
Platelets: 248 10*3/uL (ref 150–400)
RBC: 3.08 MIL/uL — ABNORMAL LOW (ref 4.22–5.81)
RDW: 15.3 % (ref 11.5–15.5)
WBC: 6.2 10*3/uL (ref 4.0–10.5)
nRBC: 0 % (ref 0.0–0.2)

## 2018-11-21 LAB — RETICULOCYTES
Immature Retic Fract: 15 % (ref 2.3–15.9)
RBC.: 2.8 MIL/uL — ABNORMAL LOW (ref 4.22–5.81)
Retic Count, Absolute: 43.5 10*3/uL (ref 19.0–186.0)
Retic Ct Pct: 1.6 % (ref 0.4–3.1)

## 2018-11-21 LAB — HEPATIC FUNCTION PANEL
ALT: 11 U/L (ref 0–44)
AST: 15 U/L (ref 15–41)
Albumin: 1.7 g/dL — ABNORMAL LOW (ref 3.5–5.0)
Alkaline Phosphatase: 121 U/L (ref 38–126)
Bilirubin, Direct: 0.3 mg/dL — ABNORMAL HIGH (ref 0.0–0.2)
Indirect Bilirubin: 0.4 mg/dL (ref 0.3–0.9)
Total Bilirubin: 0.7 mg/dL (ref 0.3–1.2)
Total Protein: 6.9 g/dL (ref 6.5–8.1)

## 2018-11-21 LAB — HEMOGLOBIN AND HEMATOCRIT, BLOOD
HCT: 26.8 % — ABNORMAL LOW (ref 39.0–52.0)
HCT: 26.6 % — ABNORMAL LOW (ref 39.0–52.0)
HCT: 26.9 % — ABNORMAL LOW (ref 39.0–52.0)
Hemoglobin: 7.7 g/dL — ABNORMAL LOW (ref 13.0–17.0)
Hemoglobin: 8.1 g/dL — ABNORMAL LOW (ref 13.0–17.0)
Hemoglobin: 7.8 g/dL — ABNORMAL LOW (ref 13.0–17.0)

## 2018-11-21 LAB — HEMOGLOBIN A1C
Hgb A1c MFr Bld: 6.2 % — ABNORMAL HIGH (ref 4.8–5.6)
Mean Plasma Glucose: 131.24 mg/dL

## 2018-11-21 LAB — PREPARE RBC (CROSSMATCH)

## 2018-11-21 LAB — PREALBUMIN: Prealbumin: 6.9 mg/dL — ABNORMAL LOW (ref 18–38)

## 2018-11-21 LAB — PROTEIN / CREATININE RATIO, URINE
Creatinine, Urine: <10 mg/dL
Total Protein, Urine: 60 mg/dL

## 2018-11-21 LAB — VITAMIN B12: Vitamin B-12: 299 pg/mL (ref 180–914)

## 2018-11-21 LAB — PROTEIN, PLEURAL OR PERITONEAL FLUID: Total protein, fluid: <3 g/dL

## 2018-11-21 LAB — LACTATE DEHYDROGENASE: LDH: 151 U/L (ref 98–192)

## 2018-11-21 LAB — LACTATE DEHYDROGENASE, PLEURAL OR PERITONEAL FLUID: LD, Fluid: 82 U/L — ABNORMAL HIGH (ref 3–23)

## 2018-11-21 LAB — HIV ANTIBODY (ROUTINE TESTING W REFLEX): HIV Screen 4th Generation wRfx: NONREACTIVE

## 2018-11-21 MED ORDER — PERFLUTREN LIPID MICROSPHERE
1.0000 mL | INTRAVENOUS | Status: AC | PRN
  Administered 2018-11-21: 16:00:00 5 mL via INTRAVENOUS
  Filled 2018-11-21: qty 10

## 2018-11-21 MED ORDER — SODIUM CHLORIDE 0.9% IV SOLUTION: Freq: Once | INTRAVENOUS | Status: DC

## 2018-11-21 MED ORDER — HYDRALAZINE HCL 20 MG/ML IJ SOLN: 10.0000 mg | INTRAMUSCULAR | Status: DC | PRN

## 2018-11-21 MED ORDER — LIDOCAINE HCL 1 % IJ SOLN
INTRAMUSCULAR | Status: DC | PRN
  Administered 2018-11-21: 10 mL

## 2018-11-21 MED ORDER — LIDOCAINE HCL 1 % IJ SOLN
INTRAMUSCULAR | Status: AC
  Filled 2018-11-21: qty 20

## 2018-11-21 MED ORDER — IPRATROPIUM BROMIDE 0.02 % IN SOLN
0.5000 mg | Freq: Four times a day (QID) | RESPIRATORY_TRACT | Status: DC
  Administered 2018-11-21 – 2018-11-22 (×6): 0.5 mg via RESPIRATORY_TRACT
  Filled 2018-11-21 (×6): qty 2.5

## 2018-11-21 MED ORDER — SODIUM CHLORIDE 0.9 % IV SOLN
510.0000 mg | Freq: Once | INTRAVENOUS | Status: AC
  Administered 2018-11-21: 510 mg via INTRAVENOUS
  Filled 2018-11-21: qty 17

## 2018-11-21 MED ORDER — FUROSEMIDE 10 MG/ML IJ SOLN
60.0000 mg | Freq: Two times a day (BID) | INTRAMUSCULAR | Status: DC
  Administered 2018-11-21 – 2018-11-23 (×4): 60 mg via INTRAVENOUS
  Filled 2018-11-21 (×4): qty 6

## 2018-11-21 NOTE — Progress Notes (Signed)
PROGRESS NOTE  Brad [name] DOB: 06-Apr-1956 DOA: 11/20/2018 PCP: Biagio Borg, MD   LOS: 1 day   Brief Narrative / Interim history: Brad [name] is a 63 y.o. 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 with progressive dyspnea on exertion and lower extremity edema.  Patient states that he has not been taking any medications for about 6 months now.  He was incarcerated at [facility] jail on 08/21/2018 and since that time has not been given any medications.   Initial vitals in the ED showed BP 194/87, pulse 72, RR 17, temp 97.6 Fahrenheit, SPO2 97% on room air.  Labs are notable for WBC 6.7, hemoglobin 7.2 (11.3 on 10/01/2017), MCV 78.9, platelets 294, sodium 138, potassium 3.6, BUN 13, creatinine 0.88, serum glucose 123, troponin I 0.04, BNP 1440.06, FOBT negative.  Two view chest x-ray showed a large left pleural effusion with moderate left lung volume loss.  Patient was given IV Lasix 40 mg once admitted for CHF exacerbation, pleural effusion and anemia.  Subjective: No major events overnight of this morning.  Reports some improvement in his breathing.  Denies chest pain, orthopnea, PND, palpitation, abdominal pain, nausea, vomiting, melena or hematochezia.  Assessment & Plan: Principal Problem:   Pleural effusion on left Active Problems:   COPD (chronic obstructive pulmonary disease) (HCC)   History of CVA (cerebrovascular accident) without residual deficits   Diabetes ([place])   Hypothyroidism   Atherosclerotic peripheral vascular disease with intermittent claudication (HCC)   Duodenal ulcer disease   Essential hypertension   Diastolic CHF  ([place])   Acute on chronic anemia   Stage III squamous cell carcinoma of left lung (HCC)  Large Left Pleural Effusion/Hx of Stage III Squamous Cell Lung Cancer: Status post thoracocentesis on 4/16 with a removal of 1.3 L transudative fluid by protein and LDH. Appears to have completed radiation therapy but appears to have not finished his chemotherapy regimen of Taxol and carboplatin.  He has previously followed with oncology, Dr. Curt Bears, but has missed multiple follow-up appointments. -Follow pleural fluid cultures, Gram stain and AFB -Manage CHF exacerbation as below -Outpatient oncology follow-up  Combined systolic-diastolic CHF exacerbation:  EF 45-50%, G2DD by TTE 09/2015.   2+ pitting edema bilaterally, JVD and bibasilar crackles on exam.  -Increase Lasix to 60 mg twice a day -Daily weight, intake output and renal function. -Sodium and fluid restriction. -Update echocardiogram.  Anemia of chronic disease: Hgb 7.2 on admission  and dropped to 6.6.  He denies melena or hematochezia.  Has history of duodenal ulcer.  FOBT negative.    Baseline appears to be between 10-11.  Anemia panel consistent with iron deficiency.  Hemodynamically stable. -Received 1 unit hemoglobin up to 7.7. -Monitor H&H -Feraheme infusion -Continue oral ferrous sulfate -Continue Protonix 40 mg daily -We will consult GI if further drop.  Hx of CVA/PVD/elevated troponin: Likely demand ischemia in the setting of CHF exacerbation. -Hold aspirin given anemia  Accelerated hypertension likely due to missing his medications.  Blood pressure improved but remains elevated. -Increase Lasix to 60 mg twice daily. -We will stop amlodipine due to massive edema. -Hold lisinopril while diuresing -PRN hydralazine vs. beta-blocker given  borderline heart rate  Prediabetes: A1c 6.2% -Sensitive SSI and CBG monitoring.  Hx of Duodenal Ulcers: Denies melena or hematochezia. -Continue Protonix -Monitor hemoglobin and  for signs/symptoms of bleeding  COPD: Chronic and stable. -Restart Atrovent and Anoro Ellipta  Hypothyroidism: TSH elevated likely due to not taking his Synthroid. -Restart levothyroxine 112 mcg  Tobacco use: Smokes 1 PPD for many years. -Nicotine patch ordered -Not ready to quit.  Scheduled Meds: . sodium chloride   Intravenous Once  . sodium chloride   Intravenous Once  . amLODipine  5 mg Oral Daily  . ferrous sulfate  325 mg Oral BID  . furosemide  40 mg Intravenous Q12H  . insulin aspart  0-9 Units Subcutaneous TID WC  . ipratropium  0.5 mg Nebulization Q6H  . levothyroxine  112 mcg Oral Q0600  . lidocaine      . lisinopril  20 mg Oral Daily  . nicotine  21 mg Transdermal Daily  . pantoprazole  40 mg Oral QHS  . potassium chloride  20 mEq Oral BID  . sodium chloride flush  3 mL Intravenous Q12H  . umeclidinium-vilanterol  1 puff Inhalation Daily   Continuous Infusions: PRN Meds:.acetaminophen **OR** acetaminophen, lidocaine  DVT prophylaxis: SCD due to anemia Code Status: Full code Family Communication: None at bedside Disposition Plan: Remains inpatient  Consultants:   None  Procedures:   Thoracocentesis on 4/16  Antimicrobials:  None  Objective: Vitals:   11/21/18 0730 11/21/18 0801 11/21/18 1016 11/21/18 1139  BP: (!) 178/67 (!) 183/73 (!) 170/79 (!) 188/78  Pulse:  64 (!) 59   Resp:  20 (!) 21   Temp: (!) 97.5 F (36.4 C) (!) 97.5 F (36.4 C) (!) 97.4 F (36.3 C)   TempSrc: Oral Oral Oral   SpO2:  100% 100%   Weight:      Height:        Intake/Output Summary (Last 24 hours) at 11/21/2018 1303 Last data filed at 11/21/2018 1149 Gross per 24 hour  Intake 304 ml  Output 3050 ml  Net -2746 ml   Filed Weights   11/21/18 0020  Weight: 103.8 kg    Examination:  GENERAL: Disheveled.  No acute distress.  EYES - vision grossly intact. Sclera anicteric.  NOSE- no gross deformity or drainage MOUTH - no oral lesions noted THROAT- no  swelling or erythema LUNGS:  No IWOB. Good air movement. CTAB.  HEART: RRR. Heart sounds normal.  JVD.  2+ edema bilaterally. ABD: Bowel sounds present. Soft. Non tender.  MSK/EXT: Moves extremities. No obvious deformity. SKIN: no apparent skin lesion.  NEURO: Awake, alert and oriented appropriately.  No gross deficit.  PSYCH: Calm. Normal affect.   Data Reviewed: I have independently reviewed following labs and imaging studies  CBC: Recent Labs  Lab 11/20/18 1717 11/21/18 0414 11/21/18 1223  WBC 6.7 6.2  --   HGB 7.2* 6.6* 7.7*  HCT 26.4* 23.8* 26.8*  MCV 78.1* 77.3*  --   PLT 294 248  --    Basic Metabolic Panel: Recent Labs  Lab 11/20/18 1717 11/21/18 0414  NA 138 139  K 3.6 3.4*  CL 100 99  CO2 28 31  GLUCOSE 123* 126*  BUN 13 13  CREATININE 0.88 0.91  CALCIUM 8.2* 8.1*   GFR: Estimated Creatinine Clearance: 104.5 mL/min (by C-G formula based on SCr of 0.91 mg/dL). Liver Function Tests: Recent Labs  Lab 11/21/18 0414  AST 15  ALT 11  ALKPHOS 118  BILITOT 0.6  PROT 5.9*  ALBUMIN 1.5*   No results for input(s): LIPASE, AMYLASE in the last 168 hours. No results for input(s): AMMONIA in the last 168 hours. Coagulation Profile: No results for input(s): INR, PROTIME in the last 168 hours. Cardiac Enzymes: Recent Labs  Lab 11/20/18 1717 11/20/18 2242  TROPONINI 0.04* 0.04*   BNP (last 3 results) No results for input(s): PROBNP in the last 8760 hours. HbA1C: Recent Labs    11/20/18 2241  HGBA1C 6.2*   CBG: Recent Labs  Lab 11/21/18 0756 11/21/18 1224  GLUCAP 90 113*   Lipid Profile: No results for input(s): CHOL, HDL, LDLCALC, TRIG, CHOLHDL, LDLDIRECT in the last 72 hours. Thyroid Function Tests: Recent Labs    11/20/18 2241  TSH 13.206*   Anemia Panel: Recent Labs    11/20/18 2242 11/21/18 0714  VITAMINB12  --  299  FOLATE  --  12.5  FERRITIN 72 63  TIBC  --  244*  IRON  --  14*  RETICCTPCT  --  1.6   Urine analysis:     Component Value Date/Time   COLORURINE YELLOW 05/10/2016 [place] 05/10/2016 1434   LABSPEC 1.015 05/10/2016 1434   PHURINE 6.0 05/10/2016 1434   GLUCOSEU NEGATIVE 05/10/2016 1434   HGBUR MODERATE (A) 05/10/2016 1434   BILIRUBINUR NEGATIVE 05/10/2016 1434   KETONESUR NEGATIVE 05/10/2016 1434   PROTEINUR >300 (A) 11/13/2014 0619   UROBILINOGEN 0.2 05/10/2016 1434   NITRITE NEGATIVE 05/10/2016 1434   LEUKOCYTESUR NEGATIVE 05/10/2016 1434   Sepsis Labs: Invalid input(s): PROCALCITONIN, LACTICIDVEN  Recent Results (from the past 240 hour(s))  MRSA PCR Screening     Status: None   Collection Time: 11/21/18 12:25 AM  Result Value Ref Range Status   MRSA by PCR NEGATIVE NEGATIVE Final    Comment:        The GeneXpert MRSA Assay (FDA approved for NASAL specimens only), is one component of a comprehensive MRSA colonization surveillance program. It is not intended to diagnose MRSA infection nor to guide or monitor treatment for MRSA infections. QUESTIONABLE RESULTS, RECOMMEND RECOLLECT TO VERIFY NOTIFIED [place] RN 11/21/2018 AT 0500 SKEEN,P Performed at [facility], [place] [address], [place], [place] [address]   MRSA PCR Screening     Status: None   Collection Time: 11/21/18  5:12 AM  Result Value Ref Range Status   MRSA by PCR NEGATIVE NEGATIVE Final    Comment:        The GeneXpert MRSA Assay (FDA approved for NASAL specimens only), is one component of a comprehensive MRSA colonization surveillance program. It is not intended to diagnose MRSA infection nor to guide or monitor treatment for MRSA infections. Performed at [facility], [place] [address], [place], [place] [address]       Radiology Studies: Dg Chest 1 View  Result Date: 11/21/2018 CLINICAL DATA:  Follow-up thoracentesis EXAM: CHEST  1 VIEW COMPARISON:  11/20/2018 FINDINGS: Right chest remains clear. Opacification of the left lung appears progressive. The amount of pleural  density appears similar. No evidence of pneumothorax. IMPRESSION: Similar appearance of pleural density on the left. Worsened opacity of the left lung. Electronically Signed   By: Nelson Chimes M.D.   On: 11/21/2018 12:31   Dg Chest 2 View  Result Date: 11/20/2018 CLINICAL DATA:  Chest pain EXAM: CHEST - 2 VIEW COMPARISON:  06/26/2017 FINDINGS: Large left pleural effusion with basilar and apical components. Approximately 50% volume loss within the left lung. Left  chest surgical clips. Right lung is clear. IMPRESSION: Large left pleural effusion with basilar and apical components with moderate left lung volume loss. Electronically Signed   By: Ulyses Jarred M.D.   On: 11/20/2018 17:51       T. [facility] Pager [phone]  If 7PM-7AM, please contact night-coverage [email or web address] Password [email or web address] 11/21/2018, 1:03 PM

## 2018-11-21 NOTE — Procedures (Signed)
PROCEDURE SUMMARY:  Successful US guided diagnostic and therapeutic left thoracentesis. Yielded 1.3 liters of dark, yellow fluid. Pt tolerated procedure well. No immediate complications.  Specimen was sent for labs. CXR ordered.  EBL < 5 mL  Docia Barrier PA-C 11/21/2018 12:15 PM

## 2018-11-21 NOTE — Progress Notes (Signed)
CRITICAL VALUE ALERT  Critical Value:  Hgb 6.6  Date & Time Notied:  11/21/2018 0544  Provider Notified: Raliegh Ip Schorr  Orders Received/Actions taken: per protocol

## 2018-11-21 NOTE — Progress Notes (Signed)
  Echocardiogram 2D Echocardiogram with Definity has been performed.  Brad [name] 11/21/2018, 3:57 PM

## 2018-11-22 LAB — TYPE AND SCREEN
ABO/RH(D): B POS
Antibody Screen: NEGATIVE
Unit division: 0

## 2018-11-22 LAB — CBC
HCT: 23.7 % — ABNORMAL LOW (ref 39.0–52.0)
Hemoglobin: 7 g/dL — ABNORMAL LOW (ref 13.0–17.0)
MCH: 22.6 pg — ABNORMAL LOW (ref 26.0–34.0)
MCHC: 29.5 g/dL — ABNORMAL LOW (ref 30.0–36.0)
MCV: 76.5 fL — ABNORMAL LOW (ref 80.0–100.0)
Platelets: 239 10*3/uL (ref 150–400)
RBC: 3.1 MIL/uL — ABNORMAL LOW (ref 4.22–5.81)
RDW: 15.2 % (ref 11.5–15.5)
WBC: 6.4 10*3/uL (ref 4.0–10.5)
nRBC: 0 % (ref 0.0–0.2)

## 2018-11-22 LAB — GLUCOSE, CAPILLARY
Glucose-Capillary: 96 mg/dL (ref 70–99)
Glucose-Capillary: 110 mg/dL — ABNORMAL HIGH (ref 70–99)
Glucose-Capillary: 128 mg/dL — ABNORMAL HIGH (ref 70–99)

## 2018-11-22 LAB — BASIC METABOLIC PANEL
Anion gap: 4 — ABNORMAL LOW (ref 5–15)
BUN: 12 mg/dL (ref 8–23)
CO2: 38 mmol/L — ABNORMAL HIGH (ref 22–32)
Calcium: 8.1 mg/dL — ABNORMAL LOW (ref 8.9–10.3)
Chloride: 96 mmol/L — ABNORMAL LOW (ref 98–111)
Creatinine, Ser: 1.01 mg/dL (ref 0.61–1.24)
GFR calc Af Amer: >60 mL/min (ref >60)
GFR calc non Af Amer: >60 mL/min (ref >60)
Glucose, Bld: 105 mg/dL — ABNORMAL HIGH (ref 70–99)
Potassium: 3.5 mmol/L (ref 3.5–5.1)
Sodium: 138 mmol/L (ref 135–145)

## 2018-11-22 LAB — ACID FAST SMEAR (AFB, MYCOBACTERIA): Acid Fast Smear: NEGATIVE

## 2018-11-22 LAB — MAGNESIUM: Magnesium: 1.8 mg/dL (ref 1.7–2.4)

## 2018-11-22 LAB — BPAM RBC
Blood Product Expiration Date: 202005022359
ISSUE DATE / TIME: 202004160732
Unit Type and Rh: 7300

## 2018-11-22 LAB — HEMOGLOBIN AND HEMATOCRIT, BLOOD
HCT: 26.9 % — ABNORMAL LOW (ref 39.0–52.0)
Hemoglobin: 7.9 g/dL — ABNORMAL LOW (ref 13.0–17.0)

## 2018-11-22 MED ORDER — POTASSIUM CHLORIDE CRYS ER 20 MEQ PO TBCR
40.0000 meq | EXTENDED_RELEASE_TABLET | Freq: Every day | ORAL | Status: DC
  Administered 2018-11-22 – 2018-11-24 (×3): 40 meq via ORAL
  Filled 2018-11-22 (×3): qty 2

## 2018-11-22 MED ORDER — IPRATROPIUM BROMIDE 0.02 % IN SOLN: 0.5000 mg | Freq: Four times a day (QID) | RESPIRATORY_TRACT | Status: DC | PRN

## 2018-11-22 MED ORDER — ENSURE ENLIVE PO LIQD
237.0000 mL | Freq: Two times a day (BID) | ORAL | Status: DC
  Administered 2018-11-22 – 2018-11-27 (×8): 237 mL via ORAL

## 2018-11-22 MED FILL — Perflutren Lipid Microsphere IV Susp 1.1 MG/ML: INTRAVENOUS | Qty: 10 | Status: AC

## 2018-11-22 NOTE — Progress Notes (Signed)
PROGRESS NOTE  Brad [name] BJS:[id number] DOB: Aug 01, 1956 DOA: 11/20/2018 PCP: Biagio Borg, MD   LOS: 2 days   Brief Narrative / Interim history: Brad [name] is a 63 y.o. male with medical history significant for COPD, Stage III Squamous Cell Cancer of Left Upper Lobe (s/p radiation, incomplete chemotherapy), CHF (EF 45-50%, G2DD by TTE 09/2015), T2DM, HTN, PVD, Chronic Hepatitis C s/p Harvoni, Anemia, Hx of CVA, Hx of Duodenal Ulcers, Hx or RLE DVT no longer on anticoagulation who presents to the ED from [facility] with progressive dyspnea on exertion and lower extremity edema.  Patient states that he has not been taking any medications for about 6 months now.  He was incarcerated at [facility] jail on 08/21/2018 and since that time has not been given any medications.   Initial vitals in the ED showed BP 194/87, pulse 72, RR 17, temp 97.6 Fahrenheit, SPO2 97% on room air.  Labs are notable for WBC 6.7, hemoglobin 7.2 (11.3 on 10/01/2017), MCV 78.9, platelets 294, sodium 138, potassium 3.6, BUN 13, creatinine 0.88, serum glucose 123, troponin I 0.04, BNP 1440.06, FOBT negative.  Two view chest x-ray showed a large left pleural effusion with moderate left lung volume loss.  Patient was given IV Lasix 40 mg once admitted for CHF exacerbation, pleural effusion and anemia.  Patient had thoracocentesis on 4/16 that yielded about 1.3 L of transudative fluid.  Subjective: No major events overnight of this morning.  No complaint this morning.  Excellent response to IV Lasix.  Denies chest pain, dyspnea or abdominal pain.  Assessment & Plan: Principal Problem:   Pleural effusion on left Active Problems:   COPD (chronic obstructive pulmonary disease) (HCC)   History of CVA (cerebrovascular accident) without residual deficits   Hypothyroidism   Atherosclerotic peripheral vascular disease with intermittent claudication (HCC)   Duodenal ulcer disease   Essential  hypertension   Diastolic CHF ([place])   Acute on chronic anemia   Stage III squamous cell carcinoma of left lung (HCC)   Acute exacerbation of CHF (congestive heart failure) (HCC)  Large Left Pleural Effusion/Hx of Stage III Squamous Cell Lung Cancer: Status post thoracocentesis on 4/16 with a removal of 1.3 L transudative fluid by protein and LDH.  Pleural fluid Gram stain and culture negative so far.  Appears to have completed radiation therapy but appears to have not finished his chemotherapy regimen of Taxol and carboplatin.  He has previously followed with oncology, Dr. Curt Bears, but has missed multiple follow-up appointments. -Follow pleural fluid cytology and AFB -Manage CHF exacerbation as below -Notified Oncology, Dr. Maylon Peppers who will notify patient's primary oncologist  Combined systolic-diastolic CHF exacerbation/LEE: Echo with EF of 55 to 60%, severe concentric LVH, severe LAE, moderate RAE and concern about infiltrative process. (EF 45-50%, G2DD by TTE 09/2015).   Urine without significant proteinuria. Still with 2+ pitting edema bilaterally, JVD and bibasilar crackles on exam.   Excellent response to IV Lasix.  Renal function stable. -Continue IV Lasix to 60 mg twice a day -Daily weight, intake output and renal function. -Sodium and fluid restriction. -Cardiology consulted for echo finding concerning for infiltrative process -TED hose  Anemia of chronic disease: Hgb 7.2 on admission  and dropped to 6.6.  He denies melena or hematochezia.  Has history of duodenal ulcer.  FOBT negative.    Baseline appears to be between 10-11.  Anemia panel consistent with iron deficiency.  Hemodynamically stable.  Received Feraheme on 4/16 -Received 1  unit hemoglobin stable at 7.8 -Monitor daily -Continue oral ferrous sulfate -Continue Protonix 40 mg daily -We will consult GI if further drop.  Hx of CVA/PVD/elevated troponin: Likely demand ischemia in the setting of CHF exacerbation. -Hold  aspirin given anemia  Accelerated hypertension likely due to missing his medications: Improved. -Lasix to 60 mg twice daily. -We will stop amlodipine due to massive edema. -Hold lisinopril while diuresing -PRN hydralazine vs. beta-blocker given borderline heart rate  Prediabetes: A1c 6.2% -Sensitive SSI and CBG monitoring.  Hx of Duodenal Ulcers: Denies melena or hematochezia. -Continue Protonix -Monitor hemoglobin and for signs/symptoms of bleeding  COPD: Chronic and stable. -Restarted Atrovent and Anoro Ellipta  Hypothyroidism: TSH elevated likely due to not taking his Synthroid. -Restarted levothyroxine 112 mcg  Tobacco use: Smokes 1 PPD for many years. -Nicotine patch ordered -Not ready to quit.  Scheduled Meds: . sodium chloride   Intravenous Once  . sodium chloride   Intravenous Once  . feeding supplement (ENSURE ENLIVE)  237 mL Oral BID BM  . ferrous sulfate  325 mg Oral BID  . furosemide  60 mg Intravenous Q12H  . insulin aspart  0-9 Units Subcutaneous TID WC  . ipratropium  0.5 mg Nebulization Q6H  . levothyroxine  112 mcg Oral Q0600  . nicotine  21 mg Transdermal Daily  . pantoprazole  40 mg Oral QHS  . potassium chloride  40 mEq Oral Daily  . sodium chloride flush  3 mL Intravenous Q12H  . umeclidinium-vilanterol  1 puff Inhalation Daily   Continuous Infusions: PRN Meds:.acetaminophen **OR** acetaminophen, hydrALAZINE, lidocaine  DVT prophylaxis: SCD due to anemia Code Status: Full code Family Communication: None at bedside.  [facility] from correction center. Disposition Plan: Remains inpatient for diuresis with IV Lasix  Consultants:   None  Procedures:   Thoracocentesis on 4/16  Antimicrobials:  None  Objective: Vitals:   11/22/18 0234 11/22/18 0445 11/22/18 0453 11/22/18 0500  BP:  (!) 166/66 (!) 161/58   Pulse: 71 68 67   Resp: 18 20    Temp:  98.1 F (36.7 C)    TempSrc:  Oral    SpO2: 100% 93% 91%   Weight:    96.8 kg   Height:        Intake/Output Summary (Last 24 hours) at 11/22/2018 1300 Last data filed at 11/22/2018 0900 Gross per 24 hour  Intake 896.29 ml  Output 5375 ml  Net -4478.71 ml   Filed Weights   11/21/18 0020 11/22/18 0500  Weight: 103.8 kg 96.8 kg    Examination:  GENERAL: Appears well. No acute distress.  HEENT: MMM.  Vision and Hearing grossly intact.  NECK: Supple.  No JVD.  LUNGS:  No IWOB.  Diminished aeration over left lung.   HEART:  RRR. Heart sounds normal. 2+ pitting edema bilaterally ABD: Bowel sounds present. Soft. Non tender.  EXT:   no edema bilaterally.  2+ pitting edema bilaterally SKIN: no apparent skin lesion.  NEURO: Awake, alert and oriented appropriately.  No gross deficit.  PSYCH: Calm. Normal affect. Data Reviewed: I have independently reviewed following labs and imaging studies  CBC: Recent Labs  Lab 11/20/18 1717 11/21/18 0414 11/21/18 1223 11/21/18 1648 11/21/18 2226 11/22/18 0418 11/22/18 1021  WBC 6.7 6.2  --   --   --  6.4  --   HGB 7.2* 6.6* 7.7* 8.1* 7.8* 7.0* 7.9*  HCT 26.4* 23.8* 26.8* 26.6* 26.9* 23.7* 26.9*  MCV 78.1* 77.3*  --   --   --  76.5*  --   PLT 294 248  --   --   --  239  --    Basic Metabolic Panel: Recent Labs  Lab 11/20/18 1717 11/21/18 0414 11/22/18 0418  NA 138 139 138  K 3.6 3.4* 3.5  CL 100 99 96*  CO2 28 31 38*  GLUCOSE 123* 126* 105*  BUN 13 13 12   CREATININE 0.88 0.91 1.01  CALCIUM 8.2* 8.1* 8.1*  MG  --   --  1.8   GFR: Estimated Creatinine Clearance: 91.1 mL/min (by C-G formula based on SCr of 1.01 mg/dL). Liver Function Tests: Recent Labs  Lab 11/21/18 0414 11/21/18 1420  AST 15 15  ALT 11 11  ALKPHOS 118 121  BILITOT 0.6 0.7  PROT 5.9* 6.9  ALBUMIN 1.5* 1.7*   No results for input(s): LIPASE, AMYLASE in the last 168 hours. No results for input(s): AMMONIA in the last 168 hours. Coagulation Profile: No results for input(s): INR, PROTIME in the last 168 hours. Cardiac Enzymes:  Recent Labs  Lab 11/20/18 1717 11/20/18 2242  TROPONINI 0.04* 0.04*   BNP (last 3 results) No results for input(s): PROBNP in the last 8760 hours. HbA1C: Recent Labs    11/20/18 2241  HGBA1C 6.2*   CBG: Recent Labs  Lab 11/21/18 1224 11/21/18 1631 11/21/18 2205 11/22/18 0831 11/22/18 1220  GLUCAP 113* 161* 152* 96 110*   Lipid Profile: Recent Labs    11/21/18 1420  CHOL 90  HDL 28*  LDLCALC 53  TRIG 45  CHOLHDL 3.2   Thyroid Function Tests: Recent Labs    11/20/18 2241  TSH 13.206*   Anemia Panel: Recent Labs    11/20/18 2242 11/21/18 0714  VITAMINB12  --  299  FOLATE  --  12.5  FERRITIN 72 63  TIBC  --  244*  IRON  --  14*  RETICCTPCT  --  1.6   Urine analysis:    Component Value Date/Time   COLORURINE STRAW (A) 11/21/2018 2018   APPEARANCEUR CLEAR 11/21/2018 2018   LABSPEC 1.005 11/21/2018 2018   [place] 7.0 11/21/2018 2018   [place] 11/21/2018 2018   GLUCOSEU NEGATIVE 05/10/2016 1434   [place] NEGATIVE 11/21/2018 2018   [place] NEGATIVE 11/21/2018 2018   KETONESUR NEGATIVE 11/21/2018 2018   PROTEINUR 30 (A) 11/21/2018 2018   UROBILINOGEN 0.2 05/10/2016 1434   NITRITE NEGATIVE 11/21/2018 2018   LEUKOCYTESUR NEGATIVE 11/21/2018 2018   Sepsis Labs: Invalid input(s): PROCALCITONIN, LACTICIDVEN  Recent Results (from the past 240 hour(s))  MRSA PCR Screening     Status: None   Collection Time: 11/21/18 12:25 AM  Result Value Ref Range Status   MRSA by PCR NEGATIVE NEGATIVE Final    Comment:        The GeneXpert MRSA Assay (FDA approved for NASAL specimens only), is one component of a comprehensive MRSA colonization surveillance program. It is not intended to diagnose MRSA infection nor to guide or monitor treatment for MRSA infections. QUESTIONABLE RESULTS, RECOMMEND RECOLLECT TO VERIFY NOTIFIED [place] RN 11/21/2018 AT 0500 SKEEN,P Performed at [facility], [place] [address] [place], [place] [address]   MRSA  PCR Screening     Status: None   Collection Time: 11/21/18  5:12 AM  Result Value Ref Range Status   MRSA by PCR NEGATIVE NEGATIVE Final    Comment:        The GeneXpert MRSA Assay (FDA approved for NASAL specimens only), is one component of a comprehensive MRSA  colonization surveillance program. It is not intended to diagnose MRSA infection nor to guide or monitor treatment for MRSA infections. Performed at [facility], [place] [address], [place], [place] [address]   Gram stain     Status: None   Collection Time: 11/21/18 12:03 PM  Result Value Ref Range Status   Specimen Description FLUID PLEURAL LEFT  Final   Special Requests NONE  Final   Gram Stain   Final    CYTOSPIN SMEAR WBC PRESENT,BOTH PMN AND MONONUCLEAR NO ORGANISMS SEEN Performed at [facility], [place] [address] [place], [place] [address]    Report Status 11/21/2018 FINAL  Final  Culture, body fluid-bottle     Status: None (Preliminary result)   Collection Time: 11/21/18 12:03 PM  Result Value Ref Range Status   Specimen Description FLUID PLEURAL LEFT  Final   Special Requests NONE  Final   Culture   Final    NO GROWTH < 24 HOURS Performed at [facility], [place] [address] [place], [place] [address]    Report Status PENDING  Incomplete      Radiology Studies: No results found.   Cheyenne Bordeaux T. [facility] Pager [phone]  If 7PM-7AM, please contact night-coverage [email or web address] Password [email or web address] 11/22/2018, 1:00 PM

## 2018-11-22 NOTE — Progress Notes (Signed)
Initial Nutrition Assessment RD working remotely.  DOCUMENTATION CODES:   Not applicable  INTERVENTION:   Ensure Enlive po BID, each supplement provides 350 kcal and 20 grams of protein  NUTRITION DIAGNOSIS:   Increased nutrient needs related to catabolic illness as evidenced by estimated needs.  GOAL:   Patient will meet greater than or equal to 90% of their needs  MONITOR:   PO intake, Supplement acceptance  REASON FOR ASSESSMENT:   Consult Assessment of nutrition requirement/status  ASSESSMENT:   63 yo male with PMH of COPD, stage III squamous cell cancer of LUL (s/p radiation, incomplete chemo), CHF, PVD, HLD, HTN, DM-2, MVP, stroke, and hepatitis who was admitted from the [facility] jail with left pleural effusion.   S/P US guided left thoracentesis 4/16 with removal of 1.3 liters of dark, yellow fluid.  Patient did not answer room phone when called by RD. Unable to obtain any nutrition hx. From chart review, patient has lost ~9% of usual weight within the past 14 months. With lung cancer diagnosis and recent incarceration, suspect intake has been reduced recently. Since admission, patient has consumed 25-100% of meals.   Labs reviewed.  CBG's: [id number] Medications reviewed and include ferrous sulfate, Lasix, Novolog, KCl.  Weight down 7 kg in one day, r/t volume status. I/O -7.5 L since admission.  NUTRITION - FOCUSED PHYSICAL EXAM:  unable to complete  Diet Order:   Diet Order            Diet 2 gram sodium Room service appropriate? Yes; Fluid consistency: Thin  Diet effective now              EDUCATION NEEDS:   No education needs have been identified at this time  Skin:  Skin Assessment: Reviewed RN Assessment(wound to L thigh present on admission)  Last BM:  4/15  Height:   Ht Readings from Last 1 Encounters:  11/21/18 5\' 11"  (1.803 m)    Weight:   Wt Readings from Last 1 Encounters:  11/22/18 96.8 kg    Ideal Body Weight:   78.2 kg  BMI:  Body mass index is 29.78 kg/m.  Estimated Nutritional Needs:   Kcal:  3235-5732  Protein:  120-135 gm  Fluid:  2.3 L    Molli Barrows, RD, LDN, CNSC Pager [phone] After Hours Pager [phone]

## 2018-11-22 NOTE — TOC Initial Note (Signed)
Transition of Care [facility]) - Initial/Assessment Note    Patient Details  Name: Brad [name] MRN: [id number] Date of Birth: 02-20-1956  [facility] CM/SW Contact:    Bethena Roys, RN Phone Number: 11/22/2018, 3:09 PM  Clinical Narrative:  Pt presented for dyspnea on exertion. S/p thoracentesis 1.3 liters removed. PTA from [facility]- plan to return once stable. Please fax d/c summary, and all Rx's to [phone]. No further needs from this CM.         Expected Discharge Plan: [facility] Barriers to Discharge: Continued Medical Work up   Patient Goals and CMS Choice Patient states their goals for this hospitalization and ongoing recovery are:: (N/A) CMS Medicare.gov Compare Post Acute Care list provided to:: (N/A) Choice offered to / list presented to : NA  Expected Discharge Plan and Services Expected Discharge Plan: [place] In-house Referral: NA Discharge Planning Services: CM Consult Post Acute Care Choice: NA Living arrangements for the past 2 months: [place]                 DME Arranged: N/A DME Agency: NA HH Arranged: NA [place] Agency: NA  Prior Living Arrangements/Services Living arrangements for the past 2 months: [place] with:: Other (Comment)([facility]) Patient language and need for interpreter reviewed:: Yes        Need for Family Participation in Patient Care: No (Comment) Care giver support system in place?: No (comment)   Criminal Activity/Legal Involvement Pertinent to Current Situation/Hospitalization: No - Comment as needed  Activities of Daily Living      Permission Sought/Granted Permission sought to share information with : Case Manager                Emotional Assessment Appearance:: Appears older than stated age   Affect (typically observed): Accepting Orientation: : Oriented to Self, Oriented to Situation, Oriented to  Place, Oriented to  Time Alcohol / Substance Use: Not Applicable Psych Involvement: No (comment)  Admission diagnosis:  Chest Pain; SOB; Confusion Patient Active Problem List   Diagnosis Date Noted  . Acute exacerbation of CHF (congestive heart failure) ([place]) 11/21/2018  . Encounter for antineoplastic chemotherapy 08/27/2017  . Stage III squamous cell carcinoma of left lung ([place]) 06/29/2017  . Mediastinal lymphadenopathy   . Pleural effusion on left   . Lung mass 04/27/2017  . Abnormal chest x-ray 04/13/2017  . Loss of weight 05/10/2016  . Grief reaction 05/10/2016  . Preop exam for internal medicine 05/10/2016  . Liver fibrosis 03/09/2016  . Smoker 01/23/2016  . Peripheral vascular disease ([place]) 01/19/2016  . GI bleed 12/08/2015  . Acute on chronic anemia 12/08/2015  . Diastolic CHF ([place]) 52/77/8242  . Hypersomnolence 12/07/2015  . Carotid stenosis, symptomatic, with infarction ([place]) 11/30/2015  . HLD (hyperlipidemia) 11/12/2015  . S/P carotid endarterectomy 11/12/2015  . Secondary cardiomyopathy ([place]) 10/14/2015  . Acute CVA (cerebrovascular accident) ([place]) 09/28/2015  . Stroke (cerebrum) ([place]) 09/27/2015  . Facial droop 09/27/2015  . Chronic hepatitis C ([place]) 09/17/2015  . Skin lesion 09/14/2015  . Insect bite, infected 09/14/2015  . Left shoulder pain 06/08/2015  . Hypoalbuminemia 02/17/2015  . Elevated alkaline phosphatase level 02/17/2015  . Essential hypertension 02/16/2015  . Carotid stenosis 11/13/2014  . Atherosclerotic peripheral vascular disease with intermittent claudication ([place])   . Duodenal ulcer disease   . Anemia   . Tobacco abuse 09/23/2014  . Hypothyroidism 08/27/2014  . Bilateral carotid artery stenosis   . Diabetes ([place])  05/25/2014  . Hypertensive heart disease   . History of CVA (cerebrovascular accident) without residual deficits   . Obesity (BMI 30-39.9) 01/19/2014  . Erectile dysfunction 10/10/2013  . Chronic LBP   . COPD (chronic  obstructive pulmonary disease) ([place]) 03/17/2010  . GERD 03/17/2010   PCP:  Biagio Borg, MD Pharmacy:   [facility] [place], [place] - [address] AT [place] [address] Phone: [phone] Fax: [place], [place] - [address] [facility]. [address] [place] [address] Phone: [phone] Fax: [phone]     Social Determinants of Health (SDOH) Interventions    Readmission Risk Interventions No flowsheet data found.

## 2018-11-23 DIAGNOSIS — J42 Unspecified chronic bronchitis: Secondary | ICD-10-CM

## 2018-11-23 DIAGNOSIS — K269 Duodenal ulcer, unspecified as acute or chronic, without hemorrhage or perforation: Secondary | ICD-10-CM

## 2018-11-23 LAB — CBC
HCT: 26.9 % — ABNORMAL LOW (ref 39.0–52.0)
Hemoglobin: 7.8 g/dL — ABNORMAL LOW (ref 13.0–17.0)
MCH: 22.3 pg — ABNORMAL LOW (ref 26.0–34.0)
MCHC: 29 g/dL — ABNORMAL LOW (ref 30.0–36.0)
MCV: 77.1 fL — ABNORMAL LOW (ref 80.0–100.0)
Platelets: 276 10*3/uL (ref 150–400)
RBC: 3.49 MIL/uL — ABNORMAL LOW (ref 4.22–5.81)
RDW: 15.3 % (ref 11.5–15.5)
WBC: 7.1 10*3/uL (ref 4.0–10.5)
nRBC: 0 % (ref 0.0–0.2)

## 2018-11-23 LAB — BASIC METABOLIC PANEL
Anion gap: 9 (ref 5–15)
BUN: 15 mg/dL (ref 8–23)
CO2: 36 mmol/L — ABNORMAL HIGH (ref 22–32)
Calcium: 8.2 mg/dL — ABNORMAL LOW (ref 8.9–10.3)
Chloride: 93 mmol/L — ABNORMAL LOW (ref 98–111)
Creatinine, Ser: 1.29 mg/dL — ABNORMAL HIGH (ref 0.61–1.24)
GFR calc Af Amer: >60 mL/min (ref >60)
GFR calc non Af Amer: 59 mL/min — ABNORMAL LOW (ref >60)
Glucose, Bld: 114 mg/dL — ABNORMAL HIGH (ref 70–99)
Potassium: 3.5 mmol/L (ref 3.5–5.1)
Sodium: 138 mmol/L (ref 135–145)

## 2018-11-23 LAB — GLUCOSE, CAPILLARY
Glucose-Capillary: 94 mg/dL (ref 70–99)
Glucose-Capillary: 145 mg/dL — ABNORMAL HIGH (ref 70–99)
Glucose-Capillary: 135 mg/dL — ABNORMAL HIGH (ref 70–99)
Glucose-Capillary: 102 mg/dL — ABNORMAL HIGH (ref 70–99)

## 2018-11-23 LAB — MAGNESIUM: Magnesium: 1.9 mg/dL (ref 1.7–2.4)

## 2018-11-23 MED ORDER — POTASSIUM CHLORIDE CRYS ER 20 MEQ PO TBCR
40.0000 meq | EXTENDED_RELEASE_TABLET | Freq: Once | ORAL | Status: AC
  Administered 2018-11-23: 40 meq via ORAL
  Filled 2018-11-23: qty 2

## 2018-11-23 MED ORDER — FUROSEMIDE 10 MG/ML IJ SOLN
40.0000 mg | Freq: Two times a day (BID) | INTRAMUSCULAR | Status: DC
  Administered 2018-11-23 – 2018-11-24 (×2): 40 mg via INTRAVENOUS
  Filled 2018-11-23 (×2): qty 4

## 2018-11-23 NOTE — Progress Notes (Signed)
PROGRESS NOTE  Brad [name] YOV:[id number] DOB: 12-25-1955 DOA: 11/20/2018 PCP: Biagio Borg, MD   LOS: 3 days   Brief Narrative / Interim history: Brad [name] is a 63 y.o. male with medical history significant for COPD, Stage III Squamous Cell Cancer of Left Upper Lobe (s/p radiation, incomplete chemotherapy), CHF (EF 45-50%, G2DD by TTE 09/2015), T2DM, HTN, PVD, Chronic Hepatitis C s/p [place], Anemia, Hx of CVA, Hx of Duodenal Ulcers, Hx or RLE DVT no longer on anticoagulation who presents to the ED from [facility] with progressive dyspnea on exertion and lower extremity edema.  Patient states that he has not been taking any medications for about 6 months now.  He was incarcerated at [facility] jail on 08/21/2018 and since that time has not been given any medications.   Initial vitals in the ED showed BP 194/87, pulse 72, RR 17, temp 97.6 Fahrenheit, SPO2 97% on room air.  Labs are notable for WBC 6.7, hemoglobin 7.2 (11.3 on 10/01/2017), MCV 78.9, platelets 294, sodium 138, potassium 3.6, BUN 13, creatinine 0.88, serum glucose 123, troponin I 0.04, BNP 1440.06, FOBT negative.  Two view chest x-ray showed a large left pleural effusion with moderate left lung volume loss.  Patient was given IV Lasix 40 mg once admitted for CHF exacerbation, pleural effusion and anemia. Continued on IV Lasix 60 mg twice daily with good urine output.  Lasix was reduced to 40 mg twice daily due to slight uptrend in serum creatinine.  Patient had thoracocentesis on 4/16 that yielded about 1.3 L of transudative fluid.  Fluid cultures and AFB Gram stain negative.  AFB culture and cytology pending.  Subjective: No major events overnight of this morning.  No complaint this morning.  Denies chest pain, dyspnea or abdominal pain.  Assessment & Plan: Principal Problem:   Pleural effusion on left Active Problems:   COPD (chronic obstructive pulmonary disease) (HCC)   History of CVA  (cerebrovascular accident) without residual deficits   Hypothyroidism   Atherosclerotic peripheral vascular disease with intermittent claudication (HCC)   Duodenal ulcer disease   Essential hypertension   Diastolic CHF ([place])   Acute on chronic anemia   Stage III squamous cell carcinoma of left lung (HCC)   Acute exacerbation of CHF (congestive heart failure) (HCC)  Large Left Pleural Effusion/Hx of Stage III Squamous Cell Lung Cancer: Status post thoracocentesis on 4/16 with a removal of 1.3 L transudative fluid by protein and LDH.  Pleural fluid Gram stain and culture negative so far.  Appears to have completed radiation therapy but appears to have not finished his chemotherapy regimen of Taxol and carboplatin.  He has previously followed with oncology, Dr. Curt Bears, but has missed multiple follow-up appointments. -Follow pleural fluid cytology and AFB -Manage CHF exacerbation as below -Discussed with oncology, Dr. Maylon Peppers 4/17 who suggested follow-up on cytology and CT chest and abdomen and call back on 4/20 -We will wait on CT until renal function improves.  Combined systolic-diastolic CHF exacerbation/LEE: Echo with EF of 55 to 60%, severe concentric LVH, severe LAE, moderate RAE and concern about infiltrative process. (EF 45-50%, G2DD by TTE 09/2015).   Urine without significant proteinuria. Still with 2+ pitting edema bilaterally.  JVD and crackles improved.  Excellent response to IV Lasix but creatinine rose. -Reduce IV Lasix to 40 mg twice daily -Daily weight, intake output and renal function. -Sodium and fluid restriction. -Cardiology consulted for echo finding concerning for infiltrative process on 4/17. -TED hose  Anemia of chronic disease: Hgb 7.2 on admission  and dropped to 6.6.  Received 1 unit with appropriate response.  No GI bleed.  FOBT negative. Baseline appears to be between 10-11.  Anemia panel consistent with iron deficiency.  Received Feraheme on 4/16.  H&H stable.  -Monitor daily -Continue oral ferrous sulfate -Continue Protonix 40 mg daily -We will consult GI if Hgb drops.  Hx of CVA/PVD/elevated troponin: Likely demand ischemia in the setting of CHF exacerbation. -Hold aspirin given anemia  Accelerated hypertension likely due to missing his medications: Improved. -Lasix as above -We will stop amlodipine due to massive edema. -Hold lisinopril while diuresing -PRN hydralazine vs. beta-blocker given borderline heart rate  Prediabetes: A1c 6.2% -Sensitive SSI and CBG monitoring.  Hx of Duodenal Ulcers: Denies melena or hematochezia.  FOBT negative. -PPI as above  COPD: Chronic and stable. -Restarted Atrovent and Anoro Ellipta  Hypothyroidism: TSH elevated likely due to not taking his Synthroid. -Restarted levothyroxine 112 mcg  Tobacco use: Smokes 1 PPD for many years. -Nicotine patch ordered -Not ready to quit.  Scheduled Meds: . sodium chloride   Intravenous Once  . sodium chloride   Intravenous Once  . feeding supplement (ENSURE ENLIVE)  237 mL Oral BID BM  . ferrous sulfate  325 mg Oral BID  . furosemide  40 mg Intravenous Q12H  . insulin aspart  0-9 Units Subcutaneous TID WC  . levothyroxine  112 mcg Oral Q0600  . nicotine  21 mg Transdermal Daily  . pantoprazole  40 mg Oral QHS  . potassium chloride  40 mEq Oral Daily  . sodium chloride flush  3 mL Intravenous Q12H  . umeclidinium-vilanterol  1 puff Inhalation Daily   Continuous Infusions: PRN Meds:.acetaminophen **OR** acetaminophen, hydrALAZINE, ipratropium, lidocaine  DVT prophylaxis: SCD due to anemia Code Status: Full code Family Communication: None at bedside.  [facility] is from correction center. Disposition Plan: Remains inpatient for adequate diuresis with IV Lasix.  Still with significant fluid overload especially in his lower extremities.  Consultants:   None  Procedures:   Thoracocentesis on 4/16  Antimicrobials:  None  Objective: Vitals:    11/23/18 0449 11/23/18 0450 11/23/18 0600 11/23/18 0715  BP: (!) 159/70     Pulse: 71     Resp: (!) 24     Temp:  97.8 F (36.6 C)    TempSrc:  Oral    SpO2: 93%   96%  Weight:   91.5 kg   Height:        Intake/Output Summary (Last 24 hours) at 11/23/2018 1313 Last data filed at 11/23/2018 1220 Gross per 24 hour  Intake 582 ml  Output 5900 ml  Net -5318 ml   Filed Weights   11/21/18 0020 11/22/18 0500 11/23/18 0600  Weight: 103.8 kg 96.8 kg 91.5 kg    Examination:  GENERAL: No acute distress.  Disheveled. HEENT: MMM.  Vision and Hearing grossly intact.  NECK: Supple.  No JVD.  LUNGS:  No IWOB.  Diminished aeration over left lung.  Some bibasilar crackles. HEART:  RRR. Heart sounds normal.  2+ pitting edema bilaterally. ABD: Bowel sounds present. Soft. Non tender.  EXT: 2+ pitting edema bilaterally. SKIN: no apparent skin lesion.  NEURO: Awake, alert and oriented appropriately.  No gross deficit.  PSYCH: Calm. Normal affect. Data Reviewed: I have independently reviewed following labs and imaging studies  CBC: Recent Labs  Lab 11/20/18 1717 11/21/18 0414  11/21/18 1648 11/21/18 2226 11/22/18 0418 11/22/18 1021 11/23/18 0335  WBC 6.7 6.2  --   --   --  6.4  --  7.1  HGB 7.2* 6.6*   < > 8.1* 7.8* 7.0* 7.9* 7.8*  HCT 26.4* 23.8*   < > 26.6* 26.9* 23.7* 26.9* 26.9*  MCV 78.1* 77.3*  --   --   --  76.5*  --  77.1*  PLT 294 248  --   --   --  239  --  276   < > = values in this interval not displayed.   Basic Metabolic Panel: Recent Labs  Lab 11/20/18 1717 11/21/18 0414 11/22/18 0418 11/23/18 0335  NA 138 139 138 138  K 3.6 3.4* 3.5 3.5  CL 100 99 96* 93*  CO2 28 31 38* 36*  GLUCOSE 123* 126* 105* 114*  BUN 13 13 12 15   CREATININE 0.88 0.91 1.01 1.29*  CALCIUM 8.2* 8.1* 8.1* 8.2*  MG  --   --  1.8 1.9   GFR: Estimated Creatinine Clearance: 69.6 mL/min (A) (by C-G formula based on SCr of 1.29 mg/dL (H)). Liver Function Tests: Recent Labs  Lab 11/21/18  0414 11/21/18 1420  AST 15 15  ALT 11 11  ALKPHOS 118 121  BILITOT 0.6 0.7  PROT 5.9* 6.9  ALBUMIN 1.5* 1.7*   No results for input(s): LIPASE, AMYLASE in the last 168 hours. No results for input(s): AMMONIA in the last 168 hours. Coagulation Profile: No results for input(s): INR, PROTIME in the last 168 hours. Cardiac Enzymes: Recent Labs  Lab 11/20/18 1717 11/20/18 2242  TROPONINI 0.04* 0.04*   BNP (last 3 results) No results for input(s): PROBNP in the last 8760 hours. HbA1C: Recent Labs    11/20/18 2241  HGBA1C 6.2*   CBG: Recent Labs  Lab 11/22/18 0831 11/22/18 1220 11/22/18 2205 11/23/18 0741 11/23/18 1135  GLUCAP 96 110* 128* 94 145*   Lipid Profile: Recent Labs    11/21/18 1420  CHOL 90  HDL 28*  LDLCALC 53  TRIG 45  CHOLHDL 3.2   Thyroid Function Tests: Recent Labs    11/20/18 2241  TSH 13.206*   Anemia Panel: Recent Labs    11/20/18 2242 11/21/18 0714  VITAMINB12  --  299  FOLATE  --  12.5  FERRITIN 72 63  TIBC  --  244*  IRON  --  14*  RETICCTPCT  --  1.6   Urine analysis:    Component Value Date/Time   COLORURINE STRAW (A) 11/21/2018 2018   APPEARANCEUR CLEAR 11/21/2018 2018   LABSPEC 1.005 11/21/2018 2018   [place] 7.0 11/21/2018 2018   [place] 11/21/2018 2018   GLUCOSEU NEGATIVE 05/10/2016 1434   [place] NEGATIVE 11/21/2018 2018   [place] NEGATIVE 11/21/2018 2018   KETONESUR NEGATIVE 11/21/2018 2018   PROTEINUR 30 (A) 11/21/2018 2018   UROBILINOGEN 0.2 05/10/2016 1434   NITRITE NEGATIVE 11/21/2018 2018   LEUKOCYTESUR NEGATIVE 11/21/2018 2018   Sepsis Labs: Invalid input(s): PROCALCITONIN, LACTICIDVEN  Recent Results (from the past 240 hour(s))  MRSA PCR Screening     Status: None   Collection Time: 11/21/18 12:25 AM  Result Value Ref Range Status   MRSA by PCR NEGATIVE NEGATIVE Final    Comment:        The GeneXpert MRSA Assay (FDA approved for NASAL specimens only), is one component of a  comprehensive MRSA colonization surveillance program. It is not intended to diagnose MRSA infection nor to guide or monitor treatment for MRSA infections. QUESTIONABLE RESULTS, RECOMMEND RECOLLECT TO  VERIFY NOTIFIED [place] RN 11/21/2018 AT 0500 SKEEN,P Performed at [facility], [place] [address] [place], [place] [address]   MRSA PCR Screening     Status: None   Collection Time: 11/21/18  5:12 AM  Result Value Ref Range Status   MRSA by PCR NEGATIVE NEGATIVE Final    Comment:        The GeneXpert MRSA Assay (FDA approved for NASAL specimens only), is one component of a comprehensive MRSA colonization surveillance program. It is not intended to diagnose MRSA infection nor to guide or monitor treatment for MRSA infections. Performed at [facility], [place] [address] [place], [place] [address]   Gram stain     Status: None   Collection Time: 11/21/18 12:03 PM  Result Value Ref Range Status   Specimen Description FLUID PLEURAL LEFT  Final   Special Requests NONE  Final   Gram Stain   Final    CYTOSPIN SMEAR WBC PRESENT,BOTH PMN AND MONONUCLEAR NO ORGANISMS SEEN Performed at [facility], [place] [address], [place], [place] [address]    Report Status 11/21/2018 FINAL  Final  Acid Fast Smear (AFB)     Status: None   Collection Time: 11/21/18 12:03 PM  Result Value Ref Range Status   AFB Specimen Processing Concentration  Final   Acid Fast Smear Negative  Final    Comment: (NOTE) Performed At: [facility] [place], [place] [id number] Rush Farmer MD OY:[id number]    Source (AFB) FLUID  Final    Comment: PLEURAL LEFT Performed at [facility], [place] [address] [place], [place] [address]   Culture, body fluid-bottle     Status: None (Preliminary result)   Collection Time: 11/21/18 12:03 PM  Result Value Ref Range Status   Specimen Description FLUID PLEURAL LEFT  Final   Special Requests NONE  Final   Culture   Final    NO  GROWTH 2 DAYS Performed at [facility], [place] [address] [place], [place] [address]    Report Status PENDING  Incomplete      Radiology Studies: No results found.   Zacarias Krauter T. [facility] Pager [phone]  If 7PM-7AM, please contact night-coverage [email or web address] Password [email or web address] 11/23/2018, 1:13 PM

## 2018-11-24 DIAGNOSIS — I5020 Unspecified systolic (congestive) heart failure: Secondary | ICD-10-CM | POA: Diagnosis not present

## 2018-11-24 LAB — GLUCOSE, CAPILLARY
Glucose-Capillary: 109 mg/dL — ABNORMAL HIGH (ref 70–99)
Glucose-Capillary: 167 mg/dL — ABNORMAL HIGH (ref 70–99)
Glucose-Capillary: 121 mg/dL — ABNORMAL HIGH (ref 70–99)
Glucose-Capillary: 144 mg/dL — ABNORMAL HIGH (ref 70–99)

## 2018-11-24 LAB — BASIC METABOLIC PANEL
Anion gap: 9 (ref 5–15)
BUN: 23 mg/dL (ref 8–23)
CO2: 33 mmol/L — ABNORMAL HIGH (ref 22–32)
Calcium: 8.3 mg/dL — ABNORMAL LOW (ref 8.9–10.3)
Chloride: 93 mmol/L — ABNORMAL LOW (ref 98–111)
Creatinine, Ser: 1.29 mg/dL — ABNORMAL HIGH (ref 0.61–1.24)
GFR calc Af Amer: >60 mL/min (ref >60)
GFR calc non Af Amer: 59 mL/min — ABNORMAL LOW (ref >60)
Glucose, Bld: 121 mg/dL — ABNORMAL HIGH (ref 70–99)
Potassium: 3.9 mmol/L (ref 3.5–5.1)
Sodium: 135 mmol/L (ref 135–145)

## 2018-11-24 LAB — CBC
HCT: 26.9 % — ABNORMAL LOW (ref 39.0–52.0)
Hemoglobin: 8.1 g/dL — ABNORMAL LOW (ref 13.0–17.0)
MCH: 23.3 pg — ABNORMAL LOW (ref 26.0–34.0)
MCHC: 30.1 g/dL (ref 30.0–36.0)
MCV: 77.3 fL — ABNORMAL LOW (ref 80.0–100.0)
Platelets: 305 10*3/uL (ref 150–400)
RBC: 3.48 MIL/uL — ABNORMAL LOW (ref 4.22–5.81)
RDW: 15.5 % (ref 11.5–15.5)
WBC: 8 10*3/uL (ref 4.0–10.5)
nRBC: 0 % (ref 0.0–0.2)

## 2018-11-24 LAB — MAGNESIUM: Magnesium: 1.9 mg/dL (ref 1.7–2.4)

## 2018-11-24 MED ORDER — TORSEMIDE 20 MG PO TABS
40.0000 mg | ORAL_TABLET | Freq: Two times a day (BID) | ORAL | Status: DC
  Administered 2018-11-24 – 2018-11-25 (×2): 40 mg via ORAL
  Filled 2018-11-24 (×2): qty 2

## 2018-11-24 MED ORDER — SPIRONOLACTONE 12.5 MG HALF TABLET
12.5000 mg | ORAL_TABLET | Freq: Every day | ORAL | Status: DC
  Administered 2018-11-24 – 2018-11-25 (×2): 12.5 mg via ORAL
  Filled 2018-11-24 (×3): qty 1

## 2018-11-24 MED ORDER — CARVEDILOL 3.125 MG PO TABS
3.1250 mg | ORAL_TABLET | Freq: Two times a day (BID) | ORAL | Status: DC
  Administered 2018-11-24 – 2018-11-25 (×3): 3.125 mg via ORAL
  Filled 2018-11-24 (×3): qty 1

## 2018-11-24 NOTE — Consult Note (Signed)
CARDIOLOGY CONSULT NOTE  Patient ID: Brad [name] MRN: [id number] DOB/AGE: 01-30-1956 63 y.o.  Admit date: 11/20/2018 Referring Physician  [facility] Primary Physician:  Biagio Borg, MD Reason for Consultation: Cardiomyopathy  HPI:   63 y.o. Caucasian male  With hypertension, type 2 DM, PAD s/p Rt CAS and Lt CEA, chronic hep C, COPD, stage III squamous cell cancer (s/p radiation, incomplete chemotherapy), h/o CVA, duodenal ulcers, prior h/o RLE DVT-currently not on anticoagualtion, now admitted to the hospital from [facility] with progressive dyspnea on exertion and lower extremity edema. I was consulted due to concern regarding infiltrative process on echocardiogram.  Patient has been in jail for last few weeks. Prior to that, lives by himself. He is a poor historian, but reports worsening shortness of breath and leg edema for last few weeks. He reports that he has not been on any medications for a long time. He does not know why.   During this hospitalization, he has been diuresed aggressively, with improvement in his symptoms. BP is elevated. He underwent 1.3 L thoracentesis on 04/16, that showed transudative fluid, no malignant cells on cytology.   Echocardiogram, like the one in 2017, showed suspicion for infiltrative process-like amyloidosis.   Past Medical History:  Diagnosis Date  . Anemia    takes Ferrous Sulfated daily  . Arthritis   . Carotid stenosis 08/27/2014   takes Aspirin daily  . Chronic hepatitis C ([place]) 09/17/2015  . COPD (chronic obstructive pulmonary disease) ([facility])    Albuterol daily as needed and Spiriva daily  . Depression   . Diabetes mellitus type II    takes Metformin and Glipizide daily  . Dizziness   . GERD (gastroesophageal reflux disease)   . GI bleed 12/2015  . Headache    occasionally  . History of blood clots    in right leg;being followed by Dr.Brabham for this  . History of blood transfusion 09/2014   2 units-no abnormal  reaction noted  . History of gastric ulcer   . History of hiatal hernia   . HTN (hypertension) 09/14/2015  . Hyperlipidemia 08/27/2014   takes Simvastatin daily  . Hypertension    takes Amlodipine and Lisinopril daily  . Hypothyroidism 08/27/2014   takes Synthroid daily  . Insomnia    takes Pamelor nightly  . Joint pain    hips  . Muscle spasm    takes Zanaflex daily as needed  . MVP (mitral valve prolapse)   . Peripheral edema    takes Furosemide daily as needed  . Peripheral neuropathy   . Prurigo nodularis   . Shortness of breath dyspnea   . Stroke ([place])   . Urinary frequency   . Urinary urgency      Past Surgical History:  Procedure Laterality Date  . ENDARTERECTOMY Left 11/13/2014   Procedure: ENDARTERECTOMY CAROTID WITH PATCH ANGIOPLASTY;  Surgeon: Serafina Mitchell, MD;  Location: [facility] OR;  Service: Vascular;  Laterality: Left;  . ESOPHAGOGASTRODUODENOSCOPY N/A 09/24/2014   Procedure: ESOPHAGOGASTRODUODENOSCOPY (EGD);  Surgeon: Winfield Cunas., MD;  Location: [facility] ENDOSCOPY;  Service: Endoscopy;  Laterality: N/A;  . FUDUCIAL PLACEMENT N/A 06/26/2017   Procedure: PLACEMENT OF FUDUCIAL;  Surgeon: Grace Isaac, MD;  Location: [place];  Service: Thoracic;  Laterality: N/A;  . I&D of right arm    . INCISION AND DRAINAGE PERIRECTAL ABSCESS  03/2009  . IR THORACENTESIS ASP PLEURAL SPACE W/IMG GUIDE  11/21/2018  . Neg Stress Test  2000  .  PERIPHERAL VASCULAR CATHETERIZATION N/A 11/30/2015   Procedure: Carotid PTA/Stent Intervention;  Surgeon: Serafina Mitchell, MD;  Location: [place] [facility];  Service: Cardiovascular;  Laterality: N/A;  . ROTATOR CUFF REPAIR Right 2009   multiple f/u Sxs/infection Tx  . VASCULAR SURGERY    . VIDEO BRONCHOSCOPY WITH ENDOBRONCHIAL NAVIGATION N/A 05/25/2017   Procedure: VIDEO BRONCHOSCOPY WITH ENDOBRONCHIAL NAVIGATION;  Surgeon: Rigoberto Noel, MD;  Location: [place];  Service: Thoracic;  Laterality: N/A;  . VIDEO BRONCHOSCOPY WITH ENDOBRONCHIAL  NAVIGATION N/A 06/26/2017   Procedure: VIDEO BRONCHOSCOPY WITH ENDOBRONCHIAL NAVIGATION WITH PLACEMENT OF FIDUCIALS;  Surgeon: Grace Isaac, MD;  Location: [place];  Service: Thoracic;  Laterality: N/A;  . VIDEO BRONCHOSCOPY WITH ENDOBRONCHIAL ULTRASOUND N/A 05/25/2017   Procedure: VIDEO BRONCHOSCOPY WITH ENDOBRONCHIAL ULTRASOUND;  Surgeon: Rigoberto Noel, MD;  Location: [facility] OR;  Service: Thoracic;  Laterality: N/A;     Family History  Problem Relation Age of Onset  . Heart disease Mother        automated implantable cardioverter-defibrillator   . Diabetes type II Mother   . Alcohol abuse Father   . Lung cancer Father   . Lung cancer Maternal Grandmother   . Lung cancer Sister      Social History: Social History   Socioeconomic History  . Marital status: Single    Spouse name: Not on file  . Number of children: 0  . Years of education: Not on file  . Highest education level: Not on file  Occupational History  . Occupation: Disabled  Social Needs  . Financial resource strain: Not on file  . Food insecurity:    Worry: Not on file    Inability: Not on file  . Transportation needs:    Medical: Not on file    Non-medical: Not on file  Tobacco Use  . Smoking status: Current Every Day Smoker    Packs/day: 1.00    Years: 48.00    Pack years: 48.00    Types: Cigarettes  . Smokeless tobacco: Never Used  . Tobacco comment: 1-2 pks per day  Substance and Sexual Activity  . Alcohol use: Yes    Alcohol/week: 1.0 standard drinks    Types: 1 Cans of beer per week    Comment: rarely  . Drug use: No  . Sexual activity: Yes  Lifestyle  . Physical activity:    Days per week: Not on file    Minutes per session: Not on file  . Stress: Not on file  Relationships  . Social connections:    Talks on phone: Not on file    Gets together: Not on file    Attends religious service: Not on file    Active member of club or organization: Not on file    Attends meetings of clubs or  organizations: Not on file    Relationship status: Not on file  . Intimate partner violence:    Fear of current or ex partner: Not on file    Emotionally abused: Not on file    Physically abused: Not on file    Forced sexual activity: Not on file  Other Topics Concern  . Not on file  Social History Narrative   Divorced with fiance.   Disabled since June 2010 - right shoulder   No children.           Medications Prior to Admission  Medication Sig Dispense Refill Last Dose  . amLODipine (NORVASC) 5 MG tablet Take 1 tablet (  5 mg total) daily by mouth. 90 tablet 3 Taking  . aspirin EC 81 MG tablet Take 1 tablet (81 mg total) by mouth daily. 90 tablet 11 Taking  . ATROVENT HFA 17 MCG/ACT inhaler INHALE 2 PUFFS INTO THE LUNGS FOUR TIMES DAILY 38.7 g 0   . Blood Pressure Monitor DEVI Use to check Blood pressure daily 1 Device 0 Taking  . ferrous sulfate 325 (65 FE) MG tablet Take 1 tablet (325 mg total) by mouth 3 (three) times daily with meals. (Patient taking differently: Take 325 mg 2 (two) times daily by mouth. Once to twice daily) 30 tablet 3 Taking  . furosemide (LASIX) 80 MG tablet Take 1 tablet (80 mg total) daily by mouth. 90 tablet 3 Taking  . gabapentin (NEURONTIN) 600 MG tablet TAKE 1 TABLET(600 MG) BY MOUTH THREE TIMES DAILY (Patient taking differently: Take 600 mg 3 (three) times daily as needed by mouth (for nerve pain/neuropathy.). TAKE 1 TABLET(600 MG) BY MOUTH THREE TIMES DAILY) 270 tablet 1 Taking  . glucose blood (ACCU-CHEK AVIVA PLUS) test strip Use to check blood sugars daily Dx E11.9 100 each 3 Taking  . Lancets MISC 1 each by Other route See admin instructions. Reported on 09/07/2015   Taking  . levothyroxine (SYNTHROID, LEVOTHROID) 112 MCG tablet Take 1 tablet (112 mcg total) daily by mouth. 90 tablet 3 Taking  . lisinopril (PRINIVIL,ZESTRIL) 20 MG tablet Take 1 tablet (20 mg total) daily by mouth. 90 tablet 3 Taking  . metolazone (ZAROXOLYN) 5 MG tablet Take 1 tablet  (5 mg total) daily as needed by mouth. 90 tablet 3 Taking  . potassium chloride (K-DUR) 10 MEQ tablet TAKE 2 TABLETS BY MOUTH EVERY DAY IN THE MORNING 180 tablet 3 Taking  . prochlorperazine (COMPAZINE) 10 MG tablet TAKE 1 TABLET(10 MG) BY MOUTH EVERY 6 HOURS AS NEEDED FOR NAUSEA OR VOMITING 385 tablet 0 Taking  . Respiratory Therapy Supplies (NEBULIZER) DEVI Use as directed four times daily 1 each 0 Taking  . tiZANidine (ZANAFLEX) 4 MG tablet Take 1 tablet (4 mg total) every 6 (six) hours as needed by mouth for muscle spasms. 60 tablet 2 Taking  . umeclidinium-vilanterol (ANORO ELLIPTA) 62.5-25 MCG/INH AEPB Inhale 1 puff into the lungs daily. 1 each 3 Taking    Review of Systems  Constitution: Negative for decreased appetite, malaise/fatigue, weight gain and weight loss.  HENT: Negative for congestion.   Eyes: Negative for visual disturbance.  Cardiovascular: Positive for dyspnea on exertion (Improved) and leg swelling (Improved). Negative for chest pain, palpitations and syncope.  Respiratory: Positive for shortness of breath (Improved).   Endocrine: Negative for cold intolerance.  Hematologic/Lymphatic: Does not bruise/bleed easily.  Skin: Negative for itching and rash.  Musculoskeletal: Negative for myalgias.  Gastrointestinal: Negative for abdominal pain, nausea and vomiting.  Genitourinary: Negative for dysuria.  Neurological: Negative for dizziness and weakness.  Psychiatric/Behavioral: The patient is not nervous/anxious.   All other systems reviewed and are negative.     Physical Exam: Physical Exam  Constitutional: He appears well-developed and well-nourished. No distress.  HENT:  Head: Normocephalic and atraumatic.  Eyes: Pupils are equal, round, and reactive to light. Conjunctivae are normal.  Neck: No JVD present.  Cardiovascular: Normal rate, regular rhythm and intact distal pulses.  No murmur heard. Left carotid scar   Pulmonary/Chest: Effort normal and breath  sounds normal. He has no wheezes. He has no rales.  Abdominal: Soft. Bowel sounds are normal. There is no rebound.  Musculoskeletal:  General: Edema (1+ b/l) present.  Lymphadenopathy:    He has no cervical adenopathy.  Neurological: He is alert. No cranial nerve deficit.  Oriented to self only  Skin: Skin is warm and dry.  Psychiatric: He has a normal mood and affect.  Nursing note and vitals reviewed.    Labs:   Lab Results  Component Value Date   WBC 8.0 11/24/2018   HGB 8.1 (L) 11/24/2018   HCT 26.9 (L) 11/24/2018   MCV 77.3 (L) 11/24/2018   PLT 305 11/24/2018    Recent Labs  Lab 11/21/18 1420  11/24/18 0443  NA  --    < > 135  K  --    < > 3.9  CL  --    < > 93*  CO2  --    < > 33*  BUN  --    < > 23  CREATININE  --    < > 1.29*  CALCIUM  --    < > 8.3*  PROT 6.9  --   --   BILITOT 0.7  --   --   ALKPHOS 121  --   --   ALT 11  --   --   AST 15  --   --   GLUCOSE  --    < > 121*   < > = values in this interval not displayed.    Lipid Panel     Component Value Date/Time   CHOL 90 11/21/2018 1420   TRIG 45 11/21/2018 1420   HDL 28 (L) 11/21/2018 1420   CHOLHDL 3.2 11/21/2018 1420   VLDL 9 11/21/2018 1420   LDLCALC 53 11/21/2018 1420    BNP (last 3 results) Recent Labs    11/20/18 2034  BNP 1,440.6*    HEMOGLOBIN A1C Lab Results  Component Value Date   HGBA1C 6.2 (H) 11/20/2018   MPG 131.24 11/20/2018    Cardiac Panel (last 3 results) Recent Labs    11/20/18 1717 11/20/18 2242  TROPONINI 0.04* 0.04*    Lab Results  Component Value Date   TROPONINI 0.04 ([place]) 11/20/2018     TSH Recent Labs    11/20/18 2241  TSH 13.206*      Radiology: No results found.  Scheduled Meds: . sodium chloride   Intravenous Once  . sodium chloride   Intravenous Once  . feeding supplement (ENSURE ENLIVE)  237 mL Oral BID BM  . ferrous sulfate  325 mg Oral BID  . insulin aspart  0-9 Units Subcutaneous TID WC  . levothyroxine  112 mcg Oral  Q0600  . nicotine  21 mg Transdermal Daily  . pantoprazole  40 mg Oral QHS  . sodium chloride flush  3 mL Intravenous Q12H  . spironolactone  12.5 mg Oral Daily  . torsemide  40 mg Oral BID  . umeclidinium-vilanterol  1 puff Inhalation Daily   Continuous Infusions: PRN Meds:.acetaminophen **OR** acetaminophen, hydrALAZINE, ipratropium, lidocaine  CARDIAC STUDIES:  EKG 11/21/2018: Sinus rhythm, PAC, LVH  Echocardiogram 11/21/2018: (My independent read) Severe LVH. LVEF around 50%. Restrictive physiology Grade 3 DD Mod biatrial enlargement Mod MAC (Could be affecting diastolic function assessment) Large left sided pleural effusion.  RA pressure 10-15 mmHg  Assessment & Recommendations:  63 y.o. Caucasian male with hypertension, type 2 DM, PAD s/p Rt CAS and Lt CEA, chronic hep C, COPD, stage III squamous cell cancer (s/p radiation, incomplete chemotherapy), h/o CVA, h/o duodenal ulcers, prior h/o RLE DVT-currently not on anticoagualtion, now  ad[facility] to the hospital [facility] with progressive dyspnea on exertion and lower extremity edema. I was consulted due to concern regarding infiltrative process on echocardiogram.  HFpEF: Echocardiogram shows severe LVH, preserved LVEF, restrictive physiology, biatrial enlargement. Echocardiogram is not much different than the one in 2017. Differentials include hypertensive cardiomyopathy, amyloidosis, or apical HCM. Given his uncontrolled blood pressure, the first is most likely. However, it is important to evaluate him for amyloidosis to establish diagnosis and slow down the progression of disease process, should this be amyloidosis. Hypertension to this degree is unusual in amyloidosis, but does not exclude amyloidosis. I have ordered serum immunofixation. Will obtain PYP study.   A this time, continue spironolactone. May add losartan, when cr improves. Okay to add amlodipine for blood pressure control, if necessary.   Rest of the  management as per the primary team.  Nigel Mormon, MD 11/24/2018, 11:48 AM [facility] [place] Pager: [phone] Office: [phone] If no answer Cell [phone]

## 2018-11-24 NOTE — Progress Notes (Signed)
PROGRESS NOTE  Brad [name] BSJ:[id number] DOB: [birth year] DOA: 11/20/2018 PCP: Biagio Borg, MD   LOS: 4 days   Brief Narrative / Interim history: Brad [name] is a 63 y.o. male with medical history significant for COPD, Stage III Squamous Cell Cancer of Left Upper Lobe (s/p radiation, incomplete chemotherapy), CHF (EF 45-50%, G2DD by TTE 09/2015), T2DM, HTN, PVD, Chronic Hepatitis C s/p Harv[place], Anemia, Hx of CVA, Hx of Duodenal Ulcers, Hx or RLE DVT no longer on anticoagulation who presents to the ED from [facility] with progressive dyspnea on exertion and lower extremity edema.  Patient states that he has not been taking any medications for about 6 months now.  He was incarcerated at [facility] jail on 08/21/2018 and since that time has not been given any medications.   Initial vitals in the ED showed BP 194/87, pulse 72, RR 17, temp 97.6 Fahrenheit, SPO2 97% on room air.  Labs are notable for WBC 6.7, hemoglobin 7.2 (11.3 on 10/01/2017), MCV 78.9, platelets 294, sodium 138, potassium 3.6, BUN 13, creatinine 0.88, serum glucose 123, troponin I 0.04, BNP 1440.06, FOBT negative.  Two view chest x-ray showed a large left pleural effusion with moderate left lung volume loss.  Patient was given IV Lasix 40 mg once admitted for CHF exacerbation, pleural effusion and anemia. Continued on IV Lasix 60 mg twice daily with good urine output.  Lasix was reduced to 40 mg twice daily due to slight uptrend in serum creatinine.  Transitioned to oral torsemide 40 mg twice daily and Aldactone 12.5 mg daily on 4/19.  Patient had thoracocentesis on 4/16 that yielded about 1.3 L of transudative fluid.  Fluid cultures, AFB Gram stain and cytology negative.  pH and AFB culture pending  Subjective: No major events overnight of this morning.  No complaints this morning.  Excellent urine output with net negative of -2/-14.  Creatinine is stable.  Blood pressure slightly elevated this  morning.  He denies dyspnea, chest pain or abdominal pain.  Some improvement in his lower extremity edema.  Assessment & Plan: Principal Problem:   Pleural effusion on left Active Problems:   COPD (chronic obstructive pulmonary disease) (HCC)   History of CVA (cerebrovascular accident) without residual deficits   Hypothyroidism   Atherosclerotic peripheral vascular disease with intermittent claudication (HCC)   Duodenal ulcer disease   Essential hypertension   Diastolic CHF ([place])   Acute on chronic anemia   Stage III squamous cell carcinoma of left lung (HCC)   Acute exacerbation of CHF (congestive heart failure) (HCC)  Large Left Pleural Effusion/Hx of Stage III Squamous Cell Lung Cancer: Status post thoracocentesis on 4/16 with a removal of 1.3 L transudative fluid by protein and LDH.  Pleural fluid Gram stain, culture and cytology negative so far.  In regards to his SCC, he appears to have completed radiation therapy but appears to have not finished his chemotherapy regimen of Taxol and carboplatin.  He has previously followed with oncology, Dr. Curt Bears, but has missed multiple follow-up appointments. -Follow pleural fluid cytology and AFB -Manage CHF exacerbation as below -Discussed with oncology, Dr. Maylon Peppers 4/17 who suggested CT chest/ABD/pelvis and call back on 4/20.  It is probably safer to arrange this outpatient and focus on diuresing at this time.  Combined systolic-diastolic CHF exacerbation/LEE: Echo with EF of 55 to 60%, severe concentric LVH, severe LAE, moderate RAE and concern about infiltrative process. (EF 45-50%, G2DD by TTE 09/2015).   Urine without significant  proteinuria. Still with 2+ pitting edema bilaterally.  JVD and crackles improved.  Excellent response to IV Lasix.  Creatinine stable after initial rise. -Transition to oral torsemide 40 mg twice daily -Add Aldactone at 12.5 mg daily to spare potassium. -Daily weight, intake output and renal function.  -Sodium and fluid restriction. -Cardiology consulted for echo finding concerning for infiltrative process on 4/17. [facility] cardiology on 4/19 -TED hose  Anemia of chronic disease: Hgb 7.2 on admission  and dropped to 6.6.  Received 1 unit with appropriate response.  No GI bleed.  FOBT negative. Baseline appears to be between 10-11.  Anemia panel consistent with iron deficiency.  Received Feraheme on 4/16.  H&H stable. -Monitor daily -Continue oral ferrous sulfate -Continue Protonix 40 mg daily  Hx of CVA/PVD/elevated troponin: Likely demand ischemia in the setting of CHF exacerbation. -Hold aspirin given anemia  Accelerated hypertension likely due to missing his medications: Improved. -Diuretics as above. -We will stop amlodipine due to massive edema. -Hold lisinopril while diuresing.  Serum creatinine slightly up. -PRN hydralazine -Add low-dose Coreg  Prediabetes: A1c 6.2% -Sensitive SSI and CBG monitoring.  Hx of Duodenal Ulcers: Denies melena or hematochezia.  FOBT negative. -PPI as above  COPD: Chronic and stable. -Restarted Atrovent and Anoro Ellipta  Hypothyroidism: TSH elevated likely due to not taking his Synthroid. -Restarted levothyroxine 112 mcg  Tobacco use: Smokes 1 PPD for many years. -Nicotine patch ordered -Encouraged to quit.  Scheduled Meds: . sodium chloride   Intravenous Once  . sodium chloride   Intravenous Once  . feeding supplement (ENSURE ENLIVE)  237 mL Oral BID BM  . ferrous sulfate  325 mg Oral BID  . insulin aspart  0-9 Units Subcutaneous TID WC  . levothyroxine  112 mcg Oral Q0600  . nicotine  21 mg Transdermal Daily  . pantoprazole  40 mg Oral QHS  . sodium chloride flush  3 mL Intravenous Q12H  . spironolactone  12.5 mg Oral Daily  . torsemide  40 mg Oral BID  . umeclidinium-vilanterol  1 puff Inhalation Daily   Continuous Infusions: PRN Meds:.acetaminophen **OR** acetaminophen, hydrALAZINE, ipratropium, lidocaine   DVT prophylaxis: SCD due to anemia Code Status: Full code Family Communication: None at bedside.  [facility] from [facility] center. Disposition Plan: Remains inpatient for adequate diuresis. Still with significant fluid overload especially in his lower extremities but could be discharged back to correction center on 4/20 if remains a stable on current regimen with cardiologist blessing.  Consultants:   Cardiology  Procedures:   Thoracocentesis on 4/16  Antimicrobials:  None  Objective: Vitals:   11/23/18 1958 11/23/18 2010 11/24/18 0444 11/24/18 0709  BP: (!) 185/143 (!) 143/65 (!) 182/64   Pulse: 89 78 75   Resp: (!) 29 (!) 21 (!) 21   Temp: 98.3 F (36.8 C)  97.9 F (36.6 C)   TempSrc: Oral  Axillary   SpO2: 98% 97% 93% 90%  Weight:   89.1 kg   Height:        Intake/Output Summary (Last 24 hours) at 11/24/2018 1307 Last data filed at 11/24/2018 1200 Gross per 24 hour  Intake 1577 ml  Output 3050 ml  Net -1473 ml   Filed Weights   11/22/18 0500 11/23/18 0600 11/24/18 0444  Weight: 96.8 kg 91.5 kg 89.1 kg    Examination:  GENERAL: No acute distress.  HEENT: MMM.  Vision and Hearing grossly intact.  NECK: Supple.  No JVD.  LUNGS:  No IWOB.  Diminished  aeration over left lung.  Mild bibasilar crackles. HEART:  RRR. Heart sounds normal.  2+ pitting edema bilaterally to his knees ABD: Bowel sounds present. Soft. Non tender.  EXT: 2+ pitting edema bilateral to his knees. SKIN: no apparent skin lesion.  NEURO: Awake, alert and oriented appropriately.  No gross deficit.  PSYCH: Calm. Normal affect.  Data Reviewed: I have independently reviewed following labs and imaging studies  CBC: Recent Labs  Lab 11/20/18 1717 11/21/18 0414  11/21/18 2226 11/22/18 0418 11/22/18 1021 11/23/18 0335 11/24/18 0443  WBC 6.7 6.2  --   --  6.4  --  7.1 8.0  HGB 7.2* 6.6*   < > 7.8* 7.0* 7.9* 7.8* 8.1*  HCT 26.4* 23.8*   < > 26.9* 23.7* 26.9* 26.9* 26.9*  MCV 78.1* 77.3*   --   --  76.5*  --  77.1* 77.3*  PLT 294 248  --   --  239  --  276 305   < > = values in this interval not displayed.   Basic Metabolic Panel: Recent Labs  Lab 11/20/18 1717 11/21/18 0414 11/22/18 0418 11/23/18 0335 11/24/18 0443  NA 138 139 138 138 135  K 3.6 3.4* 3.5 3.5 3.9  CL 100 99 96* 93* 93*  CO2 28 31 38* 36* 33*  GLUCOSE 123* 126* 105* 114* 121*  BUN 13 13 12 15 23   CREATININE 0.88 0.91 1.01 1.29* 1.29*  CALCIUM 8.2* 8.1* 8.1* 8.2* 8.3*  MG  --   --  1.8 1.9 1.9   GFR: Estimated Creatinine Clearance: 64 mL/min (A) (by C-G formula based on SCr of 1.29 mg/dL (H)). Liver Function Tests: Recent Labs  Lab 11/21/18 0414 11/21/18 1420  AST 15 15  ALT 11 11  ALKPHOS 118 121  BILITOT 0.6 0.7  PROT 5.9* 6.9  ALBUMIN 1.5* 1.7*   No results for input(s): LIPASE, AMYLASE in the last 168 hours. No results for input(s): AMMONIA in the last 168 hours. Coagulation Profile: No results for input(s): INR, PROTIME in the last 168 hours. Cardiac Enzymes: Recent Labs  Lab 11/20/18 1717 11/20/18 2242  TROPONINI 0.04* 0.04*   BNP (last 3 results) No results for input(s): PROBNP in the last 8760 hours. HbA1C: No results for input(s): HGBA1C in the last 72 hours. CBG: Recent Labs  Lab 11/23/18 1135 11/23/18 1720 11/23/18 2048 11/24/18 0756 11/24/18 1119  GLUCAP 145* 135* 102* 109* 167*   Lipid Profile: Recent Labs    11/21/18 1420  CHOL 90  HDL 28*  LDLCALC 53  TRIG 45  CHOLHDL 3.2   Thyroid Function Tests: No results for input(s): TSH, T4TOTAL, FREET4, T3FREE, THYROIDAB in the last 72 hours. Anemia Panel: No results for input(s): VITAMINB12, FOLATE, FERRITIN, TIBC, IRON, RETICCTPCT in the last 72 hours. Urine analysis:    Component Value Date/Time   COLORURINE STRAW (A) 11/21/2018 2018   APPEARANCEUR CLEAR 11/21/2018 2018   LABSPEC 1.005 11/21/2018 2018   [place] 7.0 11/21/2018 2018   GLUCOSEU NEGATIVE 11/21/2018 2018   GLUCOSEU NEGATIVE 05/10/2016  1434   [place] NEGATIVE 11/21/2018 2018   BILIRUBINUR NEGATIVE 11/21/2018 2018   KETONESUR NEGATIVE 11/21/2018 2018   PROTEINUR 30 (A) 11/21/2018 2018   UROBILINOGEN 0.2 05/10/2016 1434   NITRITE NEGATIVE 11/21/2018 2018   LEUKOCYTESUR NEGATIVE 11/21/2018 2018   Sepsis Labs: Invalid input(s): PROCALCITONIN, LACTICIDVEN  Recent Results (from the past 240 hour(s))  MRSA PCR Screening     Status: None   Collection Time: 11/21/18 12:25  AM  Result Value Ref Range Status   MRSA by PCR NEGATIVE NEGATIVE Final    Comment:        The GeneXpert MRSA Assay (FDA approved for NASAL specimens only), is one component of a comprehensive MRSA colonization surveillance program. It is not intended to diagnose MRSA infection nor to guide or monitor treatment for MRSA infections. QUESTIONABLE RESULTS, RECOMMEND RECOLLECT TO VERIFY NOTIFIED [place] RN 11/21/2018 AT 0500 SKEEN,P Performed at [facility], [place] [address] [place], [place] [address]   MRSA PCR Screening     Status: None   Collection Time: 11/21/18  5:12 AM  Result Value Ref Range Status   MRSA by PCR NEGATIVE NEGATIVE Final    Comment:        The GeneXpert MRSA Assay (FDA approved for NASAL specimens only), is one component of a comprehensive MRSA colonization surveillance program. It is not intended to diagnose MRSA infection nor to guide or monitor treatment for MRSA infections. Performed at [facility], [place] [address] [place], [place] [address]   Gram stain     Status: None   Collection Time: 11/21/18 12:03 PM  Result Value Ref Range Status   Specimen Description FLUID PLEURAL LEFT  Final   Special Requests NONE  Final   Gram Stain   Final    CYTOSPIN SMEAR WBC PRESENT,BOTH PMN AND MONONUCLEAR NO ORGANISMS SEEN Performed at [facility], [place] [address] [place], [place] [address]    Report Status 11/21/2018 FINAL  Final  Acid Fast Smear (AFB)     Status: None   Collection Time: 11/21/18 12:03  PM  Result Value Ref Range Status   AFB Specimen Processing Concentration  Final   Acid Fast Smear Negative  Final    Comment: (NOTE) Performed At: [facility] [place], [place] [id number] Rush Farmer MD NL:[id number]    Source (AFB) FLUID  Final    Comment: PLEURAL LEFT Performed at [facility], [place] [address], [place], [place] [address]   Culture, body fluid-bottle     Status: None (Preliminary result)   Collection Time: 11/21/18 12:03 PM  Result Value Ref Range Status   Specimen Description FLUID PLEURAL LEFT  Final   Special Requests NONE  Final   Culture   Final    NO GROWTH 3 DAYS Performed at [place] [address] [place], [place] [address]    Report Status PENDING  Incomplete      Radiology Studies: No results found.   Taye T. [facility] Pager [phone]  If 7PM-7AM, please contact night-coverage [email or web address] Password [email or web address] 11/24/2018, 1:07 PM

## 2018-11-25 ENCOUNTER — Inpatient Hospital Stay (HOSPITAL_COMMUNITY): Payer: Medicare Other

## 2018-11-25 LAB — GLUCOSE, CAPILLARY
Glucose-Capillary: 111 mg/dL — ABNORMAL HIGH (ref 70–99)
Glucose-Capillary: 84 mg/dL (ref 70–99)
Glucose-Capillary: 181 mg/dL — ABNORMAL HIGH (ref 70–99)
Glucose-Capillary: 173 mg/dL — ABNORMAL HIGH (ref 70–99)

## 2018-11-25 LAB — BASIC METABOLIC PANEL
Anion gap: 11 (ref 5–15)
BUN: 29 mg/dL — ABNORMAL HIGH (ref 8–23)
CO2: 32 mmol/L (ref 22–32)
Calcium: 8.4 mg/dL — ABNORMAL LOW (ref 8.9–10.3)
Chloride: 93 mmol/L — ABNORMAL LOW (ref 98–111)
Creatinine, Ser: 1.33 mg/dL — ABNORMAL HIGH (ref 0.61–1.24)
GFR calc Af Amer: >60 mL/min (ref >60)
GFR calc non Af Amer: 57 mL/min — ABNORMAL LOW (ref >60)
Glucose, Bld: 120 mg/dL — ABNORMAL HIGH (ref 70–99)
Potassium: 3.7 mmol/L (ref 3.5–5.1)
Sodium: 136 mmol/L (ref 135–145)

## 2018-11-25 LAB — PH, BODY FLUID: pH, Body Fluid: 7.8

## 2018-11-25 LAB — HEMOGLOBIN AND HEMATOCRIT, BLOOD
HCT: 24.8 % — ABNORMAL LOW (ref 39.0–52.0)
Hemoglobin: 7.5 g/dL — ABNORMAL LOW (ref 13.0–17.0)

## 2018-11-25 LAB — MAGNESIUM: Magnesium: 2 mg/dL (ref 1.7–2.4)

## 2018-11-25 MED ORDER — TORSEMIDE 20 MG PO TABS: 40.0000 mg | ORAL_TABLET | Freq: Every day | ORAL | Status: DC

## 2018-11-25 MED ORDER — TECHNETIUM TC 99M PYROPHOSPHATE
21.7000 | Freq: Once | INTRAVENOUS | Status: AC | PRN
  Administered 2018-11-25: 09:00:00 21.7 via INTRAVENOUS

## 2018-11-25 MED ORDER — METOPROLOL SUCCINATE ER 25 MG PO TB24
25.0000 mg | ORAL_TABLET | Freq: Every day | ORAL | Status: DC
  Administered 2018-11-25 – 2018-11-27 (×3): 25 mg via ORAL
  Filled 2018-11-25 (×3): qty 1

## 2018-11-25 NOTE — Progress Notes (Signed)
PROGRESS NOTE  Brad [name] DXI:[id number] DOB: 08/27/1955 DOA: 11/20/2018 PCP: Biagio Borg, MD   LOS: 5 days   Brief Narrative / Interim history: Brad [name] is a 64 y.o. male with medical history significant for COPD, Stage III Squamous Cell Cancer of Left Upper Lobe (s/p radiation, incomplete chemotherapy), CHF (EF 45-50%, G2DD by TTE 09/2015), T2DM, HTN, PVD, Chronic Hepatitis C s/p [place], Anemia, Hx of CVA, Hx of Duodenal Ulcers, Hx or RLE DVT no longer on anticoagulation who presents to the ED from [facility] with progressive dyspnea on exertion and lower extremity edema.  Patient states that he has not been taking any medications for about 6 months now.  He was incarcerated at [facility] jail on 08/21/2018 and since that time has not been given any medications.   Initial vitals in the ED showed BP 194/87, pulse 72, RR 17, temp 97.6 Fahrenheit, SPO2 97% on room air.  Labs are notable for WBC 6.7, hemoglobin 7.2 (11.3 on 10/01/2017), MCV 78.9, platelets 294, sodium 138, potassium 3.6, BUN 13, creatinine 0.88, serum glucose 123, troponin I 0.04, BNP 1440.06, FOBT negative.  Two view chest x-ray showed a large left pleural effusion with moderate left lung volume loss.  Patient was given IV Lasix 40 mg once admitted for CHF exacerbation, pleural effusion and anemia. Continued on IV Lasix 60 mg twice daily with good urine output.  Lasix was reduced to 40 mg twice daily due to slight uptrend in serum creatinine.  Transitioned to oral torsemide 40 mg twice daily and Aldactone 12.5 mg daily on 4/19.  Patient had thoracocentesis on 4/16 that yielded about 1.3 L of transudative fluid.  Fluid cultures, AFB Gram stain and cytology negative.  pH and AFB culture pending.  Subjective: No major events overnight of this morning.  No complaint this morning.  Continues to have good urine output on torsemide and Aldactone.  Weight about 24 pounds down since admission.  Plan for  PYP study today per cardiology.  Denies chest pain, dyspnea, dizziness, palpitation, GI or urinary symptoms.  Lower extremity edema improved.  Assessment & Plan: Principal Problem:   Pleural effusion on left Active Problems:   COPD (chronic obstructive pulmonary disease) (HCC)   History of CVA (cerebrovascular accident) without residual deficits   Hypothyroidism   Atherosclerotic peripheral vascular disease with intermittent claudication (HCC)   Duodenal ulcer disease   Essential hypertension   Diastolic CHF ([place])   Acute on chronic anemia   Stage III squamous cell carcinoma of left lung (HCC)   Acute exacerbation of CHF (congestive heart failure) (HCC)  Large Left Pleural Effusion/Hx of Stage III Squamous Cell Lung Cancer: Status post thoracocentesis on 4/16 with a removal of 1.3 L transudative fluid by protein and LDH.  Pleural fluid Gram stain, culture and cytology negative so far.  In regards to his SCC, he appears to have completed radiation therapy but appears to have not finished his chemotherapy regimen of Taxol and carboplatin.  He has previously followed with oncology, Dr. Curt Bears, but has missed multiple follow-up appointments. -Follow pleural fluid cytology and AFB -Manage CHF exacerbation as below -Discussed with oncology, Dr. Maylon Peppers 4/17 who suggested CT chest/ABD/pelvis and call back on 4/20.  It is probably safer to arrange this outpatient and focus on diuresing at this time.  Combined systolic-diastolic CHF exacerbation/LEE: Echo with EF of 55 to 60%, severe concentric LVH, severe LAE, moderate RAE and concern about infiltrative process. (EF 45-50%, G2DD by TTE  09/2015).   Urine without significant proteinuria. Still with 2+ pitting edema bilaterally.  JVD and crackles improved.  Responded well to IV Lasix and transition to torsemide 40 mg twice daily.  Continues to have good urine output.  Avoid 24 pound weight loss over.  Serum creatinine trended up slightly. -Reduce  torsemide to 40 mg daily. -Add Aldactone at 12.5 mg daily to spare potassium. -Daily weight, intake output and renal function. -Sodium and fluid restriction. -Immunofixation and PYP study-in process. -TED hose  Anemia of chronic disease: Hgb 7.2 on admission  and dropped to 6.6.  Received 1 unit with appropriate response.  No GI bleed.  FOBT negative. Baseline appears to be between 10-11.  Anemia panel consistent with iron deficiency.  Received Feraheme on 4/16.  H&H stable. -Monitor daily -Continue oral ferrous sulfate -Continue Protonix 40 mg daily  Hx of CVA/PVD/elevated troponin: Likely demand ischemia in the setting of CHF exacerbation. -Hold aspirin given anemia  Accelerated hypertension likely due to missing his medications: Improved. -Diuretics as above. -We will stop amlodipine due to massive edema. -Hold lisinopril while diuresing.  Serum creatinine slightly up. -Low-dose Coreg and PRN hydralazine  Prediabetes: A1c 6.2% -Sensitive SSI and CBG monitoring.  Hx of Duodenal Ulcers: Denies melena or hematochezia.  FOBT negative. -PPI as above  COPD: Chronic and stable. -Restarted Atrovent and Anoro Ellipta  Hypothyroidism: TSH elevated likely due to not taking his Synthroid. -Restarted levothyroxine 112 mcg  Tobacco use: Smokes 1 PPD for many years. -Nicotine patch ordered -Encouraged to quit.  Scheduled Meds: . sodium chloride   Intravenous Once  . sodium chloride   Intravenous Once  . carvedilol  3.125 mg Oral BID WC  . feeding supplement (ENSURE ENLIVE)  237 mL Oral BID BM  . ferrous sulfate  325 mg Oral BID  . insulin aspart  0-9 Units Subcutaneous TID WC  . levothyroxine  112 mcg Oral Q0600  . nicotine  21 mg Transdermal Daily  . pantoprazole  40 mg Oral QHS  . sodium chloride flush  3 mL Intravenous Q12H  . spironolactone  12.5 mg Oral Daily  . torsemide  40 mg Oral BID  . umeclidinium-vilanterol  1 puff Inhalation Daily   Continuous Infusions: PRN  Meds:.acetaminophen **OR** acetaminophen, hydrALAZINE, ipratropium, lidocaine  DVT prophylaxis: SCD due to anemia Code Status: Full code Family Communication: None at bedside.  [facility] is from correction center. Disposition Plan: Anticipate discharge back to correctional center of the PYP study and clearance by cardiology either today or 4/21.  Consultants:   Cardiology  Procedures:   Thoracocentesis on 4/16  Antimicrobials:  None  Objective: Vitals:   11/25/18 0510 11/25/18 0804 11/25/18 0841 11/25/18 1136  BP: 134/69  (!) 151/57 (!) 152/56  Pulse: 68  67 63  Resp: 13   16  Temp: 97.9 F (36.6 C)   97.7 F (36.5 C)  TempSrc: Oral   Oral  SpO2: 100% 94%  94%  Weight: 86.2 kg     Height:        Intake/Output Summary (Last 24 hours) at 11/25/2018 1208 Last data filed at 11/25/2018 1100 Gross per 24 hour  Intake 236 ml  Output 2100 ml  Net -1864 ml   Filed Weights   11/23/18 0600 11/24/18 0444 11/25/18 0510  Weight: 91.5 kg 89.1 kg 86.2 kg    Examination:  GENERAL:No acute distress.  HEENT: MMM.  Vision and Hearing grossly intact.  NECK: Supple.  No JVD.  LUNGS:  No IWOB.  Diminished aeration over left lung.  Mild bibasilar crackles. HEART:  RRR. Heart sounds normal.  1+ pitting edema bilaterally ABD: Bowel sounds present. Soft. Non tender.  EXT:   no edema bilaterally.  1+ pitting edema bilaterally SKIN: no apparent skin lesion.  NEURO: Awake, alert and oriented appropriately.  No gross deficit.  PSYCH: Calm. Normal affect.  Data Reviewed: I have independently reviewed following labs and imaging studies  CBC: Recent Labs  Lab 11/20/18 1717 11/21/18 0414  11/22/18 0418 11/22/18 1021 11/23/18 0335 11/24/18 0443 11/25/18 0705  WBC 6.7 6.2  --  6.4  --  7.1 8.0  --   HGB 7.2* 6.6*   < > 7.0* 7.9* 7.8* 8.1* 7.5*  HCT 26.4* 23.8*   < > 23.7* 26.9* 26.9* 26.9* 24.8*  MCV 78.1* 77.3*  --  76.5*  --  77.1* 77.3*  --   PLT 294 248  --  239  --  276 305  --     < > = values in this interval not displayed.   Basic Metabolic Panel: Recent Labs  Lab 11/21/18 0414 11/22/18 0418 11/23/18 0335 11/24/18 0443 11/25/18 0705  NA 139 138 138 135 136  K 3.4* 3.5 3.5 3.9 3.7  CL 99 96* 93* 93* 93*  CO2 31 38* 36* 33* 32  GLUCOSE 126* 105* 114* 121* 120*  BUN 13 12 15 23  29*  CREATININE 0.91 1.01 1.29* 1.29* 1.33*  CALCIUM 8.1* 8.1* 8.2* 8.3* 8.4*  MG  --  1.8 1.9 1.9 2.0   GFR: Estimated Creatinine Clearance: 62.1 mL/min (A) (by C-G formula based on SCr of 1.33 mg/dL (H)). Liver Function Tests: Recent Labs  Lab 11/21/18 0414 11/21/18 1420  AST 15 15  ALT 11 11  ALKPHOS 118 121  BILITOT 0.6 0.7  PROT 5.9* 6.9  ALBUMIN 1.5* 1.7*   No results for input(s): LIPASE, AMYLASE in the last 168 hours. No results for input(s): AMMONIA in the last 168 hours. Coagulation Profile: No results for input(s): INR, PROTIME in the last 168 hours. Cardiac Enzymes: Recent Labs  Lab 11/20/18 1717 11/20/18 2242  TROPONINI 0.04* 0.04*   BNP (last 3 results) No results for input(s): PROBNP in the last 8760 hours. HbA1C: No results for input(s): HGBA1C in the last 72 hours. CBG: Recent Labs  Lab 11/24/18 1119 11/24/18 1642 11/24/18 2110 11/25/18 0750 11/25/18 1134  GLUCAP 167* 121* 144* 111* 84   Lipid Profile: No results for input(s): CHOL, HDL, LDLCALC, TRIG, CHOLHDL, LDLDIRECT in the last 72 hours. Thyroid Function Tests: No results for input(s): TSH, T4TOTAL, FREET4, T3FREE, THYROIDAB in the last 72 hours. Anemia Panel: No results for input(s): VITAMINB12, FOLATE, FERRITIN, TIBC, IRON, RETICCTPCT in the last 72 hours. Urine analysis:    Component Value Date/Time   COLORURINE STRAW (A) 11/21/2018 2018   APPEARANCEUR CLEAR 11/21/2018 2018   LABSPEC 1.005 11/21/2018 2018   [place] 7.0 11/21/2018 2018   GLUCOSEU NEGATIVE 11/21/2018 2018   GLUCOSEU NEGATIVE 05/10/2016 1434   [place] NEGATIVE 11/21/2018 2018   BILIRUBINUR NEGATIVE  11/21/2018 2018   KETONESUR NEGATIVE 11/21/2018 2018   PROTEINUR 30 (A) 11/21/2018 2018   UROBILINOGEN 0.2 05/10/2016 1434   NITRITE NEGATIVE 11/21/2018 2018   LEUKOCYTESUR NEGATIVE 11/21/2018 2018   Sepsis Labs: Invalid input(s): PROCALCITONIN, LACTICIDVEN  Recent Results (from the past 240 hour(s))  MRSA PCR Screening     Status: None   Collection Time: 11/21/18 12:25 AM  Result Value Ref Range Status   MRSA  by PCR NEGATIVE NEGATIVE Final    Comment:        The GeneXpert MRSA Assay (FDA approved for NASAL specimens only), is one component of a comprehensive MRSA colonization surveillance program. It is not intended to diagnose MRSA infection nor to guide or monitor treatment for MRSA infections. QUESTIONABLE RESULTS, RECOMMEND RECOLLECT TO VERIFY NOTIFIED [place] RN 11/21/2018 AT 0500 SKEEN,P Performed at [facility], [place] [address], [place], [place] [address]   MRSA PCR Screening     Status: None   Collection Time: 11/21/18  5:12 AM  Result Value Ref Range Status   MRSA by PCR NEGATIVE NEGATIVE Final    Comment:        The GeneXpert MRSA Assay (FDA approved for NASAL specimens only), is one component of a comprehensive MRSA colonization surveillance program. It is not intended to diagnose MRSA infection nor to guide or monitor treatment for MRSA infections. Performed at [facility], [place] [employer] [address], [place], [place] [address]   Gram stain     Status: None   Collection Time: 11/21/18 12:03 PM  Result Value Ref Range Status   Specimen Description FLUID PLEURAL LEFT  Final   Special Requests NONE  Final   Gram Stain   Final    CYTOSPIN SMEAR WBC PRESENT,BOTH PMN AND MONONUCLEAR NO ORGANISMS SEEN Performed at [facility], [place] [address], [place], [place] [address]    Report Status 11/21/2018 FINAL  Final  Acid Fast Smear (AFB)     Status: None   Collection Time: 11/21/18 12:03 PM  Result Value Ref Range Status   AFB Specimen Processing  Concentration  Final   Acid Fast Smear Negative  Final    Comment: (NOTE) Performed At: [facility] [place], [place] [id number] Rush Farmer MD GG:[id number]    Source (AFB) FLUID  Final    Comment: PLEURAL LEFT Performed at [facility], [place] [address] [place], [place] [address]   Culture, body fluid-bottle     Status: None (Preliminary result)   Collection Time: 11/21/18 12:03 PM  Result Value Ref Range Status   Specimen Description FLUID PLEURAL LEFT  Final   Special Requests NONE  Final   Culture   Final    NO GROWTH 3 DAYS Performed at [place] [address] [place], [place] [address]    Report Status PENDING  Incomplete      Radiology Studies: No results found.   Cythina Mickelsen [facility] Pager [phone]  If 7PM-7AM, please contact night-coverage [email or web address] Password [facility] 11/25/2018, 12:08 PM

## 2018-11-25 NOTE — Progress Notes (Signed)
Reviewed PYP study and SPEP. AL or ATTR amyloidosis unlikely. Most likely differential is hypertensive cardiomyopathy. Continue hypertension management.  Nigel Mormon, MD [facility] Pager: [phone] Office: [phone] If no answer Cell [phone]

## 2018-11-26 ENCOUNTER — Other Ambulatory Visit: Payer: Self-pay

## 2018-11-26 ENCOUNTER — Encounter (HOSPITAL_COMMUNITY): Payer: Self-pay | Admitting: General Practice

## 2018-11-26 DIAGNOSIS — N179 Acute kidney failure, unspecified: Secondary | ICD-10-CM

## 2018-11-26 LAB — BASIC METABOLIC PANEL
Anion gap: 11 (ref 5–15)
BUN: 32 mg/dL — ABNORMAL HIGH (ref 8–23)
CO2: 31 mmol/L (ref 22–32)
Calcium: 8.3 mg/dL — ABNORMAL LOW (ref 8.9–10.3)
Chloride: 93 mmol/L — ABNORMAL LOW (ref 98–111)
Creatinine, Ser: 1.58 mg/dL — ABNORMAL HIGH (ref 0.61–1.24)
GFR calc Af Amer: 54 mL/min — ABNORMAL LOW (ref >60)
GFR calc non Af Amer: 47 mL/min — ABNORMAL LOW (ref >60)
Glucose, Bld: 126 mg/dL — ABNORMAL HIGH (ref 70–99)
Potassium: 3.7 mmol/L (ref 3.5–5.1)
Sodium: 135 mmol/L (ref 135–145)

## 2018-11-26 LAB — IMMUNOFIXATION ELECTROPHORESIS
IgA: 233 mg/dL (ref 61–437)
IgG (Immunoglobin G), Serum: 1774 mg/dL — ABNORMAL HIGH (ref 603–1613)
IgM (Immunoglobulin M), Srm: 66 mg/dL (ref 20–172)
Total Protein ELP: 5.8 g/dL — ABNORMAL LOW (ref 6.0–8.5)

## 2018-11-26 LAB — GLUCOSE, CAPILLARY
Glucose-Capillary: 116 mg/dL — ABNORMAL HIGH (ref 70–99)
Glucose-Capillary: 162 mg/dL — ABNORMAL HIGH (ref 70–99)
Glucose-Capillary: 135 mg/dL — ABNORMAL HIGH (ref 70–99)
Glucose-Capillary: 130 mg/dL — ABNORMAL HIGH (ref 70–99)
Glucose-Capillary: 227 mg/dL — ABNORMAL HIGH (ref 70–99)

## 2018-11-26 LAB — MAGNESIUM: Magnesium: 2 mg/dL (ref 1.7–2.4)

## 2018-11-26 LAB — CULTURE, BODY FLUID W GRAM STAIN -BOTTLE: Culture: NO GROWTH

## 2018-11-26 LAB — HEMOGLOBIN AND HEMATOCRIT, BLOOD
HCT: 25 % — ABNORMAL LOW (ref 39.0–52.0)
Hemoglobin: 7.5 g/dL — ABNORMAL LOW (ref 13.0–17.0)

## 2018-11-26 LAB — CULTURE, BODY FLUID-BOTTLE

## 2018-11-26 NOTE — Care Management Important Message (Signed)
Important Message  Patient Details  Name: Brad [name] MRN: [id number] Date of Birth: 07/04/1956   Medicare Important Message Given:  Yes    Orbie Pyo 11/26/2018, 2:52 PM

## 2018-11-26 NOTE — Progress Notes (Signed)
PROGRESS NOTE  AMORI COLOMB YYT:[id number] DOB: [birth year] DOA: 11/20/2018 PCP: Biagio Borg, MD   LOS: 6 days   Brief Narrative / Interim history: Brad [name] is a 63 y.o. male with medical history significant for COPD, Stage III Squamous Cell Cancer of Left Upper Lobe (s/p radiation, incomplete chemotherapy), CHF (EF 45-50%, G2DD by TTE 09/2015), T2DM, HTN, PVD, Chronic Hepatitis C s/p Harvoni, Anemia, Hx of CVA, Hx of Duodenal Ulcers, Hx or RLE DVT no longer on anticoagulation who presents to the ED from [facility] with progressive dyspnea on exertion and lower extremity edema.  Patient states that he has not been taking any medications for about 6 months now.  He was incarcerated at [facility] jail on 08/21/2018 and since that time has not been given any medications.   Initial vitals in the ED showed BP 194/87, pulse 72, RR 17, temp 97.6 Fahrenheit, SPO2 97% on room air.  Labs are notable for WBC 6.7, hemoglobin 7.2 (11.3 on 10/01/2017), MCV 78.9, platelets 294, sodium 138, potassium 3.6, BUN 13, creatinine 0.88, serum glucose 123, troponin I 0.04, BNP 1440.06, FOBT negative.  Two view chest x-ray showed a large left pleural effusion with moderate left lung volume loss.  Patient was given IV Lasix 40 mg once admitted for CHF exacerbation, pleural effusion and anemia. Echo with EF of 55 to 60%, severe concentric LVH, severe LAE, moderate RAE and concern about infiltrative process Continued on IV Lasix 60 mg twice daily with good urine output.  Lasix was reduced to 40 mg twice daily due to slight uptrend in serum creatinine.  Transitioned to oral torsemide 40 mg twice daily and Aldactone 12.5 mg daily on 4/19.  On 11/25/2018, torsemide was reduced to 40 mg daily due to renal function.  However, renal function continued to trend up.  Diuretics held on 11/26/18.  Cardiology was consulted for echo finding concerning for infiltrative process.  PYP study and SPEP AL or ATTR  not suggestive for amyloidosis per cardiology.  Cardiology signed off.  Patient had thoracocentesis on 4/16 that yielded about 1.3 L of transudative fluid.  Fluid cultures, AFB Gram stain and cytology negative.  pH 7.8.  AFB culture pending.  Subjective: No major events overnight of this morning.  No complaint this morning.  Denies chest pain, dyspnea or dizziness.  Edema improving.  Continues to have good urine output.  However, renal function slightly worse.    Assessment & Plan: Principal Problem:   Pleural effusion on left Active Problems:   COPD (chronic obstructive pulmonary disease) (HCC)   History of CVA (cerebrovascular accident) without residual deficits   Hypothyroidism   Atherosclerotic peripheral vascular disease with intermittent claudication (HCC)   Duodenal ulcer disease   Essential hypertension   Diastolic CHF ([place])   Acute on chronic anemia   Stage III squamous cell carcinoma of left lung (HCC)   Acute exacerbation of CHF (congestive heart failure) (HCC)  Large Left Pleural Effusion/Hx of Stage III Squamous Cell Lung Cancer: Status post thoracocentesis on 4/16 with a removal of 1.3 L transudative fluid by protein and LDH.  Pleural fluid Gram stain, culture and cytology negative so far.  pH 7.8.  AFB culture pending.  In regards to his SCC, he appears to have completed radiation therapy but appears to have not finished his chemotherapy regimen of Taxol and carboplatin.  He has previously followed with oncology, Dr. Curt Bears, but has missed multiple follow-up appointments. -Follow pleural fluid cytology and AFB -  Manage CHF exacerbation as below -Discussed with oncology, Dr. Maylon Peppers 4/17 who suggested CT chest/ABD/pelvis and call back on 4/20.  It is probably safer to arrange this outpatient and focus on diuresing at this time.  Combined systolic-diastolic CHF exacerbation/LEE: Echo with EF of 55 to 60%, severe concentric LVH, severe LAE, moderate RAE and concern about  infiltrative process. (EF 45-50%, G2DD by TTE 09/2015).   Urine without significant proteinuria. Responded well to IV Lasix and transitioned to torsemide. Continues to have good urine output.  Net -18 L.  Weight down about 26 pounds since admission.  Dyspnea resolved.  Edema improved significantly.  However, renal function slowly trended from 0.88 on admission to 1.58 -Cardiology signed off-PYP study and SPEP not suggestive for amyloidosis. -Hold diuretics. -Daily weight, intake output and renal function. -Sodium and fluid restriction. -TED hose  Anemia of chronic disease: Hgb 7.2 on admission  and dropped to 6.6.  Received 1 unit with appropriate response.  No GI bleed.  FOBT negative. Baseline appears to be between 10-11.  Anemia panel consistent with iron deficiency.  Received Feraheme on 4/16.  H&H stable. -Monitor daily -Continue oral ferrous sulfate -Continue Protonix 40 mg daily  AKI: Likely due to diuretics. -Hold diuretics and lisinopril as above -Monitor renal function.  Hx of CVA/PVD/elevated troponin: Likely demand ischemia in the setting of CHF exacerbation. -Hold aspirin given anemia  Accelerated hypertension likely due to missing his medications: Improved. -Diuretics as above. -We will stop amlodipine due to massive edema. -Hold lisinopril while diuresing.  -Low-dose Coreg and PRN hydralazine  Prediabetes: A1c 6.2% -Sensitive SSI and CBG monitoring.  Hx of Duodenal Ulcers: Denies melena or hematochezia.  FOBT negative. -PPI as above  COPD: Chronic and stable. -Restarted Atrovent and Anoro Ellipta  Hypothyroidism: TSH elevated likely due to not taking his Synthroid. -Restarted levothyroxine 112 mcg  Tobacco use: Smokes 1 PPD for many years. -Nicotine patch ordered -Encouraged to quit.  Scheduled Meds: . sodium chloride   Intravenous Once  . sodium chloride   Intravenous Once  . feeding supplement (ENSURE ENLIVE)  237 mL Oral BID BM  . ferrous sulfate   325 mg Oral BID  . insulin aspart  0-9 Units Subcutaneous TID WC  . levothyroxine  112 mcg Oral Q0600  . metoprolol succinate  25 mg Oral Daily  . nicotine  21 mg Transdermal Daily  . pantoprazole  40 mg Oral QHS  . sodium chloride flush  3 mL Intravenous Q12H  . umeclidinium-vilanterol  1 puff Inhalation Daily   Continuous Infusions: PRN Meds:.acetaminophen **OR** acetaminophen, hydrALAZINE, ipratropium, lidocaine  DVT prophylaxis: SCD due to anemia Code Status: Full code Family Communication: None at bedside.  [facility] from [facility] center. Disposition Plan: Remains inpatient.  Anticipate discharge in the next 24 to 48 hours once renal function improves and we establish appropriate diuretic dose.  Final disposition back to correction center  Consultants:   Cardiology-signed off.  Procedures:   Thoracocentesis on 4/16  Antimicrobials:  None  Objective: Vitals:   11/25/18 1953 11/26/18 0449 11/26/18 0836 11/26/18 0900  BP: (!) 175/59 (!) 139/54  (!) 133/57  Pulse: 64 (!) 59  61  Resp:      Temp: 99.6 F (37.6 C) 98.1 F (36.7 C)    TempSrc: Oral Oral    SpO2: 94% 96% 97%   Weight:  85 kg    Height:        Intake/Output Summary (Last 24 hours) at 11/26/2018 1120 Last data filed  at 11/26/2018 0900 Gross per 24 hour  Intake 960 ml  Output 1500 ml  Net -540 ml   Filed Weights   11/24/18 0444 11/25/18 0510 11/26/18 0449  Weight: 89.1 kg 86.2 kg 85 kg    Examination:  GENERAL: Appears well. No acute distress.  HEENT: MMM.  Vision and Hearing grossly intact.  NECK: Supple.  No JVD.  LUNGS:  No IWOB.  Diminished aeration over left lung.  Mild bibasilar crackles. HEART:  RRR. Heart sounds normal.  ABD: Bowel sounds present. Soft. Non tender.  EXT: 1+ pitting edema bilaterally. SKIN: no apparent skin lesion.  NEURO: Awake, alert and oriented appropriately.  No gross deficit.  PSYCH: Flat affect.  Data Reviewed: I have independently reviewed following labs  and imaging studies  CBC: Recent Labs  Lab 11/20/18 1717 11/21/18 0414  11/22/18 0418 11/22/18 1021 11/23/18 0335 11/24/18 0443 11/25/18 0705 11/26/18 0414  WBC 6.7 6.2  --  6.4  --  7.1 8.0  --   --   HGB 7.2* 6.6*   < > 7.0* 7.9* 7.8* 8.1* 7.5* 7.5*  HCT 26.4* 23.8*   < > 23.7* 26.9* 26.9* 26.9* 24.8* 25.0*  MCV 78.1* 77.3*  --  76.5*  --  77.1* 77.3*  --   --   PLT 294 248  --  239  --  276 305  --   --    < > = values in this interval not displayed.   Basic Metabolic Panel: Recent Labs  Lab 11/22/18 0418 11/23/18 0335 11/24/18 0443 11/25/18 0705 11/26/18 0414  NA 138 138 135 136 135  K 3.5 3.5 3.9 3.7 3.7  CL 96* 93* 93* 93* 93*  CO2 38* 36* 33* 32 31  GLUCOSE 105* 114* 121* 120* 126*  BUN 12 15 23  29* 32*  CREATININE 1.01 1.29* 1.29* 1.33* 1.58*  CALCIUM 8.1* 8.2* 8.3* 8.4* 8.3*  MG 1.8 1.9 1.9 2.0 2.0   GFR: Estimated Creatinine Clearance: 52.3 mL/min (A) (by C-G formula based on SCr of 1.58 mg/dL (H)). Liver Function Tests: Recent Labs  Lab 11/21/18 0414 11/21/18 1420  AST 15 15  ALT 11 11  ALKPHOS 118 121  BILITOT 0.6 0.7  PROT 5.9* 6.9  ALBUMIN 1.5* 1.7*   No results for input(s): LIPASE, AMYLASE in the last 168 hours. No results for input(s): AMMONIA in the last 168 hours. Coagulation Profile: No results for input(s): INR, PROTIME in the last 168 hours. Cardiac Enzymes: Recent Labs  Lab 11/20/18 1717 11/20/18 2242  TROPONINI 0.04* 0.04*   BNP (last 3 results) No results for input(s): PROBNP in the last 8760 hours. HbA1C: No results for input(s): HGBA1C in the last 72 hours. CBG: Recent Labs  Lab 11/25/18 0750 11/25/18 1134 11/25/18 1624 11/25/18 2107 11/26/18 0740  GLUCAP 111* 84 181* 173* 116*   Lipid Profile: No results for input(s): CHOL, HDL, LDLCALC, TRIG, CHOLHDL, LDLDIRECT in the last 72 hours. Thyroid Function Tests: No results for input(s): TSH, T4TOTAL, FREET4, T3FREE, THYROIDAB in the last 72 hours. Anemia Panel:  No results for input(s): VITAMINB12, FOLATE, FERRITIN, TIBC, IRON, RETICCTPCT in the last 72 hours. Urine analysis:    Component Value Date/Time   COLORURINE STRAW (A) 11/21/2018 2018   APPEARANCEUR CLEAR 11/21/2018 2018   LABSPEC 1.005 11/21/2018 2018   PHURINE 7.0 11/21/2018 2018   GLUCOSEU NEGATIVE 11/21/2018 2018   GLUCOSEU NEGATIVE 05/10/2016 1434   [place] 11/21/2018 2018   [place] 11/21/2018 2018  [place] NEGATIVE 11/21/2018 2018   PROTEINUR 30 (A) 11/21/2018 2018   UROBILINOGEN 0.2 05/10/2016 1434   NITRITE NEGATIVE 11/21/2018 2018   LEUKOCYTESUR NEGATIVE 11/21/2018 2018   Sepsis Labs: Invalid input(s): PROCALCITONIN, LACTICIDVEN  Recent Results (from the past 240 hour(s))  MRSA PCR Screening     Status: None   Collection Time: 11/21/18 12:25 AM  Result Value Ref Range Status   MRSA by PCR NEGATIVE NEGATIVE Final    Comment:        The GeneXpert MRSA Assay (FDA approved for NASAL specimens only), is one component of a comprehensive MRSA colonization surveillance program. It is not intended to diagnose MRSA infection nor to guide or monitor treatment for MRSA infections. QUESTIONABLE RESULTS, RECOMMEND RECOLLECT TO VERIFY NOTIFIED Angels RN 11/21/2018 AT 0500 SKEEN,P Performed at [facility], [place] [address] [place], [place] [address]   MRSA PCR Screening     Status: None   Collection Time: 11/21/18  5:12 AM  Result Value Ref Range Status   MRSA by PCR NEGATIVE NEGATIVE Final    Comment:        The GeneXpert MRSA Assay (FDA approved for NASAL specimens only), is one component of a comprehensive MRSA colonization surveillance program. It is not intended to diagnose MRSA infection nor to guide or monitor treatment for MRSA infections. Performed at [facility], [place] [address] [place], [place] [address]   Gram stain     Status: None   Collection Time: 11/21/18 12:03 PM  Result Value Ref Range Status   Specimen  Description FLUID PLEURAL LEFT  Final   Special Requests NONE  Final   Gram Stain   Final    CYTOSPIN SMEAR WBC PRESENT,BOTH PMN AND MONONUCLEAR NO ORGANISMS SEEN Performed at [facility], [place] [address] [place], [place] [address]    Report Status 11/21/2018 FINAL  Final  Acid Fast Smear (AFB)     Status: None   Collection Time: 11/21/18 12:03 PM  Result Value Ref Range Status   AFB Specimen Processing Concentration  Final   Acid Fast Smear Negative  Final    Comment: (NOTE) Performed At: [facility] [place], [place] [id number] Rush Farmer MD DD:[id number]    Source (AFB) FLUID  Final    Comment: PLEURAL LEFT Performed at [facility], [place] [address], [place], [place] [address]   Culture, body fluid-bottle     Status: None   Collection Time: 11/21/18 12:03 PM  Result Value Ref Range Status   Specimen Description FLUID PLEURAL LEFT  Final   Special Requests NONE  Final   Culture   Final    NO GROWTH 5 DAYS Performed at [facility] [address] [place], [place] [address]    Report Status 11/26/2018 FINAL  Final      Radiology Studies: Nm Scan Tumor Localize With Spect  Result Date: 11/25/2018 CLINICAL DATA:  HEART FAILURE. CONCERN FOR CARDIAC AMYLOIDOSIS. EXAM: NUCLEAR MEDICINE TUMOR LOCALIZATION. PYP CARDIAC AMYLOIDOSIS SCAN WITH SPECT TECHNIQUE: Following intravenous administration of radiopharmaceutical, anterior planar images of the chest were obtained. Regions of interest were placed on the heart and contralateral chest wall for quantitative assessment. Additional SPECT imaging of the chest was obtained. RADIOPHARMACEUTICALS:  21.7 mCi TECHNETIUM 99 PYROPHOSPHATE FINDINGS: Planar Visual assessment: Anterior planar imaging demonstrates radiotracer uptake within the heart less than than uptake within the adjacent ribs (Grade 1). Quantitative assessment : Quantitative assessment of the cardiac  uptake compared to the  contralateral chest wall is equal to 1.0 (H/CL = 1.0). SPECT assessment: SPECT imaging of the chest demonstrates minimal radiotracer accumulation within the LEFT ventricle. IMPRESSION: Visual and quantitative assessment (grade 1, H/CLL equal 1.0) are NOT suggestive of transthyretin amyloidosis. Electronically Signed   By: Suzy Bouchard M.D.   On: 11/25/2018 14:33     Noeh Sparacino T[facility] Pager [phone]  If 7PM-7AM, please contact night-coverage [email or web address] Password [facility] 11/26/2018, 11:20 AM

## 2018-11-27 DIAGNOSIS — I5023 Acute on chronic systolic (congestive) heart failure: Secondary | ICD-10-CM

## 2018-11-27 LAB — BASIC METABOLIC PANEL
Anion gap: 10 (ref 5–15)
BUN: 32 mg/dL — ABNORMAL HIGH (ref 8–23)
CO2: 28 mmol/L (ref 22–32)
Calcium: 8.2 mg/dL — ABNORMAL LOW (ref 8.9–10.3)
Chloride: 96 mmol/L — ABNORMAL LOW (ref 98–111)
Creatinine, Ser: 1.46 mg/dL — ABNORMAL HIGH (ref 0.61–1.24)
GFR calc Af Amer: 59 mL/min — ABNORMAL LOW (ref >60)
GFR calc non Af Amer: 51 mL/min — ABNORMAL LOW (ref >60)
Glucose, Bld: 162 mg/dL — ABNORMAL HIGH (ref 70–99)
Potassium: 3.8 mmol/L (ref 3.5–5.1)
Sodium: 134 mmol/L — ABNORMAL LOW (ref 135–145)

## 2018-11-27 LAB — GLUCOSE, CAPILLARY: Glucose-Capillary: 130 mg/dL — ABNORMAL HIGH (ref 70–99)

## 2018-11-27 LAB — HEMOGLOBIN AND HEMATOCRIT, BLOOD
HCT: 27 % — ABNORMAL LOW (ref 39.0–52.0)
Hemoglobin: 8.1 g/dL — ABNORMAL LOW (ref 13.0–17.0)

## 2018-11-27 MED ORDER — FUROSEMIDE 80 MG PO TABS: 80.0000 mg | ORAL_TABLET | Freq: Every day | ORAL | 3 refills | Status: DC

## 2018-11-27 MED ORDER — PANTOPRAZOLE SODIUM 40 MG PO TBEC: 40.0000 mg | DELAYED_RELEASE_TABLET | Freq: Every day | ORAL | 0 refills | Status: DC

## 2018-11-27 MED ORDER — METOPROLOL SUCCINATE ER 25 MG PO TB24: 25.0000 mg | ORAL_TABLET | Freq: Every day | ORAL | 0 refills | Status: DC

## 2018-11-27 NOTE — Plan of Care (Signed)

## 2018-11-27 NOTE — Discharge Summary (Signed)
Physician Discharge Summary  Brad [name] RUE:[id number] DOB: Sep 29, 1955 DOA: 11/20/2018  PCP: Biagio Borg, MD  Admit date: 11/20/2018 Discharge date: 11/27/2018  [facility]: [facility] Disposition:  Jail  Recommendations for Outpatient Follow-up:  1. Follow up with PCP in 1-2 weeks 2. Recommend repeat bmet in one week  Discharge Condition:Improved CODE STATUS:Full Diet recommendation: Heart healthy, diabetic   Brief/Interim Summary: 63 y.o.malewith medical history significant forCOPD, Stage III Squamous Cell Cancer of Left Upper Lobe (s/p radiation, incomplete chemotherapy), CHF (EF 45-50%, G2DD by TTE 09/2015), T2DM, HTN, PVD, Chronic Hepatitis C s/p [place], Anemia, Hx of CVA, Hx ofDuodenalUlcers, Hx or RLE DVT no longer on anticoagulationwho presents to the ED [place] progressive dyspnea on exertion and lower extremity edema.  Patient states that he has not been taking any medications for about 6 months now. He was incarcerated at [facility] jail on 08/21/2018 and since that time has not been given any medications.   Initial vitals in the ED showed BP 194/87, pulse 72, RR 17, temp 97.6 Fahrenheit, SPO2 97% on room air.  Labs are notable for WBC 6.7, hemoglobin 7.2 (11.3 on 10/01/2017), MCV 78.9, platelets 294, sodium 138, potassium 3.6, BUN 13, creatinine 0.88, serum glucose 123, troponin I 0.04, BNP 1440.06, FOBT negative.  Two view chest x-ray showed a large left pleural effusion with moderate left lung volume loss.  Patient was given IV Lasix 40 mg once admitted for CHF exacerbation, pleural effusion and anemia. Echo with EF of 55 to 60%, severe concentric LVH, severe LAE, moderate RAE and concern about infiltrative process Continued on IV Lasix 60 mg twice daily with good urine output.  Lasix was reduced to 40 mg twice daily due to slight uptrend in serum creatinine.  Transitioned to oral torsemide 40 mg twice daily and Aldactone 12.5 mg daily  on 4/19.  On 11/25/2018, torsemide was reduced to 40 mg daily due to renal function.  However, renal function continued to trend up.  Diuretics held on 11/26/18.  Cardiology was consulted for echo finding concerning for infiltrative process.  PYP study and SPEP AL or ATTR not suggestive for amyloidosis per cardiology.  Cardiology signed off.  Discharge Diagnoses:  Principal Problem:   Pleural effusion on left Active Problems:   COPD (chronic obstructive pulmonary disease) (HCC)   History of CVA (cerebrovascular accident) without residual deficits   Hypothyroidism   Atherosclerotic peripheral vascular disease with intermittent claudication (HCC)   Duodenal ulcer disease   Essential hypertension   Diastolic CHF (HCC)   Acute on chronic anemia   Stage III squamous cell carcinoma of left lung (HCC)   Acute exacerbation of CHF (congestive heart failure) (HCC)   AKI (acute kidney injury) ([place])  Large Left Pleural Effusion/Hx of Stage III Squamous Cell Lung Cancer: Status post thoracocentesis on 4/16 with a removal of 1.3 L transudative fluid by protein and LDH.  Pleural fluid Gram stain, culture and cytology negative so far.  pH 7.8.  AFB culture pending.  In regards to his SCC, he appears to have completed radiation therapy but appears to have not finished his chemotherapy regimen of Taxol and carboplatin. He haspreviously followed with oncology, Dr. Curt Bears, but has missed multiple follow-up appointments. -fluid cytology negative for malignant cells. Fluid culture neg -Discussed with oncology, Dr. Maylon Peppers 4/17 who suggested CT chest/ABD/pelvis and call back on 4/20. -Recommend outpatient imaging  Combined systolic-diastolic CHF exacerbation/LEE: Echo with EF of 55 to 60%, severe concentric LVH, severe LAE, moderate  RAE and concern about infiltrative process. (EF 45-50%, G2DD by TTE 09/2015).  Urine without significant proteinuria. Responded well to IV Lasix and transitioned to torsemide.  Continues to have good urine output.  Net -18 L.  Weight down about 26 pounds since admission.  Dyspnea resolved.  Edema improved significantly.  However, renal function slowly trended from 0.88 on admission to 1.58 -Cardiology signed off-PYP study and SPEP not suggestive for amyloidosis. -Sodium and fluid restriction.  Anemia of chronic disease: Hgb 7.2 on admission and dropped to 6.6.  Received 1 unit with appropriate response.  No GI bleed.  FOBT negative.Baseline appears to be between 10-11.  Anemia panel consistent with iron deficiency.  Received Feraheme on 4/16.  H&H stable. -Monitor daily -Continue oral ferrous sulfate -Continue Protonix 40 mg daily  AKI: Likely due to diuretics. -Hold diuretics and lisinopril as above -Monitor renal function.  Hx of CVA/PVD/elevated troponin: Likely demand ischemia in the setting of CHF exacerbation. -Held aspirin given anemia, this admit  Accelerated hypertension likely due to missing his medications: Improved. -Diuretics as above. -We will stop amlodipine due to massive edema. -Held lisinopril while diuresing. Consider resuming down the road  Prediabetes: A1c 6.2% -Sensitive SSI and CBG monitoring.  Hx of Duodenal Ulcers: Denies melena or hematochezia.  FOBT negative. -PPI as above  COPD: Chronic and stable. -Restarted Atrovent and Anoro Ellipta  Hypothyroidism: TSH elevated likely due to not taking his Synthroid. -Restarted levothyroxine 112 mcg  Tobacco use: Smokes 1 PPD for many years. -Nicotine patch ordered -Encouraged to quit.  Gross medical noncompliance -Likely contributing to current admission -Doubt patient will remain compliant, thus very high risk for early hospital readmission despite appropriate care in-hospital  Discharge Instructions   Allergies as of 11/27/2018      Reactions   Doxycycline Hives, Itching   Lipitor [atorvastatin] Other (See Comments)   Myalgia   Oxycodone Itching       Medication List    STOP taking these medications   amLODipine 5 MG tablet Commonly known as:  NORVASC   Atrovent HFA 17 MCG/ACT inhaler Generic drug:  ipratropium   gabapentin 600 MG tablet Commonly known as:  NEURONTIN   lisinopril 20 MG tablet Commonly known as:  ZESTRIL   metolazone 5 MG tablet Commonly known as:  ZAROXOLYN   Nebulizer Devi   potassium chloride 10 MEQ tablet Commonly known as:  K-DUR   prochlorperazine 10 MG tablet Commonly known as:  COMPAZINE   tiZANidine 4 MG tablet Commonly known as:  ZANAFLEX     TAKE these medications   aspirin EC 81 MG tablet Take 1 tablet (81 mg total) by mouth daily.   Blood Pressure Monitor Devi Use to check Blood pressure daily   ferrous sulfate 325 (65 FE) MG tablet Take 1 tablet (325 mg total) by mouth 3 (three) times daily with meals.   furosemide 80 MG tablet Commonly known as:  LASIX Take 1 tablet (80 mg total) by mouth daily for 30 days.   glucose blood test strip Commonly known as:  Accu-Chek Aviva Plus Use to check blood sugars daily Dx E11.9   levothyroxine 112 MCG tablet Commonly known as:  SYNTHROID Take 1 tablet (112 mcg total) daily by mouth.   metoprolol succinate 25 MG 24 hr tablet Commonly known as:  TOPROL-XL Take 1 tablet (25 mg total) by mouth daily for 30 days. Start taking on:  November 28, 2018   pantoprazole 40 MG tablet Commonly known as:  PROTONIX Take  1 tablet (40 mg total) by mouth at bedtime for 30 days.   umeclidinium-vilanterol 62.5-25 MCG/INH Aepb Commonly known as:  Anoro Ellipta Inhale 1 puff into the lungs daily.      Follow-up Information    Nigel Mormon, MD Follow up on 12/16/2018.   Specialties:  Cardiology, Radiology Why:  10:30 am Contact information: [place] [address] [phone]        Curt Bears, MD. Schedule an appointment as soon as possible for a visit in 2 week(s).   Specialty:  Oncology Contact  information: [place] [address] [phone]        Biagio Borg, MD. Schedule an appointment as soon as possible for a visit in 1 week(s).   Specialties:  Internal Medicine, Radiology Contact information: [address] [place] [address] [phone]          Allergies  Allergen Reactions  . Doxycycline Hives and Itching  . Lipitor [Atorvastatin] Other (See Comments)    Myalgia   . Oxycodone Itching    Consultations:  Cardiology  Procedures/Studies: Dg Chest 1 View  Result Date: 11/21/2018 CLINICAL DATA:  Follow-up thoracentesis EXAM: CHEST  1 VIEW COMPARISON:  11/20/2018 FINDINGS: Right chest remains clear. Opacification of the left lung appears progressive. The amount of pleural density appears similar. No evidence of pneumothorax. IMPRESSION: Similar appearance of pleural density on the left. Worsened opacity of the left lung. Electronically Signed   By: Nelson Chimes M.D.   On: 11/21/2018 12:31   Dg Chest 2 View  Result Date: 11/20/2018 CLINICAL DATA:  Chest pain EXAM: CHEST - 2 VIEW COMPARISON:  06/26/2017 FINDINGS: Large left pleural effusion with basilar and apical components. Approximately 50% volume loss within the left lung. Left chest surgical clips. Right lung is clear. IMPRESSION: Large left pleural effusion with basilar and apical components with moderate left lung volume loss. Electronically Signed   By: Ulyses Jarred M.D.   On: 11/20/2018 17:51   Nm Scan Tumor Localize With Spect  Result Date: 11/25/2018 CLINICAL DATA:  HEART FAILURE. CONCERN FOR CARDIAC AMYLOIDOSIS. EXAM: NUCLEAR MEDICINE TUMOR LOCALIZATION. PYP CARDIAC AMYLOIDOSIS SCAN WITH SPECT TECHNIQUE: Following intravenous administration of radiopharmaceutical, anterior planar images of the chest were obtained. Regions of interest were placed on the heart and contralateral chest wall for quantitative assessment. Additional SPECT imaging of the chest was  obtained. RADIOPHARMACEUTICALS:  21.7 mCi TECHNETIUM 99 PYROPHOSPHATE FINDINGS: Planar Visual assessment: Anterior planar imaging demonstrates radiotracer uptake within the heart less than than uptake within the adjacent ribs (Grade 1). Quantitative assessment : Quantitative assessment of the cardiac uptake compared to the contralateral chest wall is equal to 1.0 (H/CL = 1.0). SPECT assessment: SPECT imaging of the chest demonstrates minimal radiotracer accumulation within the LEFT ventricle. IMPRESSION: Visual and quantitative assessment (grade 1, H/CLL equal 1.0) are NOT suggestive of transthyretin amyloidosis. Electronically Signed   By: Suzy Bouchard M.D.   On: 11/25/2018 14:33   Ir Thoracentesis Asp Pleural Space W/img Guide  Result Date: 11/21/2018 INDICATION: Patient with history of lung cancer, now with pleural effusion. Request is made for diagnostic and therapeutic left thoracentesis. EXAM: ULTRASOUND GUIDED DIAGNOSTIC AND THERAPEUTIC LEFT THORACENTESIS MEDICATIONS: 10 mL 1% lidocaine COMPLICATIONS: None immediate. PROCEDURE: An ultrasound guided thoracentesis was thoroughly discussed with the patient and questions answered. The benefits, risks, alternatives and complications were also discussed. The patient understands and wishes to proceed with the procedure. Written consent was  obtained. Ultrasound was performed to localize and mark an adequate pocket of fluid in the left chest. The area was then prepped and draped in the normal sterile fashion. 1% Lidocaine was used for local anesthesia. Under ultrasound guidance a 6 Fr Safe-T-Centesis catheter was introduced. Thoracentesis was performed. The catheter was removed and a dressing applied. FINDINGS: A total of approximately 1.3 liters of yellow fluid was removed. Samples were sent to the laboratory as requested by the clinical team. IMPRESSION: Successful ultrasound guided diagnostic and therapeutic left thoracentesis yielding 1.3 liters of pleural  fluid. Read by: Brynda Greathouse PA-C Electronically Signed   By: Markus Daft M.D.   On: 11/21/2018 12:50     Subjective: Denies sob or chest pain  Discharge Exam: Vitals:   11/27/18 0838 11/27/18 0905  BP:    Pulse: 63 62  Resp:  14  Temp:    SpO2:  96%   Vitals:   11/27/18 0648 11/27/18 0649 11/27/18 0838 11/27/18 0905  BP: (!) 150/51     Pulse: (!) 58  63 62  Resp: 19   14  Temp: 98.4 F (36.9 C)     TempSrc: Oral     SpO2: 100%   96%  Weight:  86 kg    Height:        General: Pt is alert, awake, not in acute distress Cardiovascular: RRR, S1/S2 +, no rubs, no gallops Respiratory: CTA bilaterally, no wheezing, no rhonchi Abdominal: Soft, NT, ND, bowel sounds + Extremities: no edema, no cyanosis   The results of significant diagnostics from this hospitalization (including imaging, microbiology, ancillary and laboratory) are listed below for reference.     Microbiology: Recent Results (from the past 240 hour(s))  MRSA PCR Screening     Status: None   Collection Time: 11/21/18 12:25 AM  Result Value Ref Range Status   MRSA by PCR NEGATIVE NEGATIVE Final    Comment:        The GeneXpert MRSA Assay (FDA approved for NASAL specimens only), is one component of a comprehensive MRSA colonization surveillance program. It is not intended to diagnose MRSA infection nor to guide or monitor treatment for MRSA infections. QUESTIONABLE RESULTS, RECOMMEND RECOLLECT TO VERIFY NOTIFIED [place] RN 11/21/2018 AT 0500 SKEEN,P Performed at [facility], [place] [address], [place], [place] [address]   MRSA PCR Screening     Status: None   Collection Time: 11/21/18  5:12 AM  Result Value Ref Range Status   MRSA by PCR NEGATIVE NEGATIVE Final    Comment:        The GeneXpert MRSA Assay (FDA approved for NASAL specimens only), is one component of a comprehensive MRSA colonization surveillance program. It is not intended to diagnose MRSA infection nor to guide  or monitor treatment for MRSA infections. Performed at [facility], [place] [address], [place], [place] [address]   Gram stain     Status: None   Collection Time: 11/21/18 12:03 PM  Result Value Ref Range Status   Specimen Description FLUID PLEURAL LEFT  Final   Special Requests NONE  Final   Gram Stain   Final    CYTOSPIN SMEAR WBC PRESENT,BOTH PMN AND MONONUCLEAR NO ORGANISMS SEEN Performed at [facility], [place] [address] [place],  [address]    Report Status 11/21/2018 FINAL  Final  Acid Fast Smear (AFB)     Status: None   Collection Time: 11/21/18 12:03 PM  Result Value Ref Range Status  AFB Specimen Processing Concentration  Final   Acid Fast Smear Negative  Final    Comment: (NOTE) Performed At: [facility] [place], [place] [id number] Rush Farmer MD BJ:[id number]    Source (AFB) FLUID  Final    Comment: PLEURAL LEFT Performed at [facility], [place] [address], [place], [place] [address]   Culture, body fluid-bottle     Status: None   Collection Time: 11/21/18 12:03 PM  Result Value Ref Range Status   Specimen Description FLUID PLEURAL LEFT  Final   Special Requests NONE  Final   Culture   Final    NO GROWTH 5 DAYS Performed at [place] [address], [place], [place] [address]    Report Status 11/26/2018 FINAL  Final     Labs: BNP (last 3 results) Recent Labs    11/20/18 2034  BNP 2,952.8*   Basic Metabolic Panel: Recent Labs  Lab 11/22/18 0418 11/23/18 0335 11/24/18 0443 11/25/18 0705 11/26/18 0414 11/27/18 0354  NA 138 138 135 136 135 134*  K 3.5 3.5 3.9 3.7 3.7 3.8  CL 96* 93* 93* 93* 93* 96*  CO2 38* 36* 33* 32 31 28  GLUCOSE 105* 114* 121* 120* 126* 162*  BUN 12 15 23  29* 32* 32*  CREATININE 1.01 1.29* 1.29* 1.33* 1.58* 1.46*  CALCIUM 8.1* 8.2* 8.3* 8.4* 8.3* 8.2*  MG 1.8 1.9 1.9 2.0 2.0  --    Liver Function Tests: Recent Labs  Lab 11/21/18 0414 11/21/18 1420  AST 15 15   ALT 11 11  ALKPHOS 118 121  BILITOT 0.6 0.7  PROT 5.9* 6.9  ALBUMIN 1.5* 1.7*   No results for input(s): LIPASE, AMYLASE in the last 168 hours. No results for input(s): AMMONIA in the last 168 hours. CBC: Recent Labs  Lab 11/20/18 1717 11/21/18 0414  11/22/18 0418  11/23/18 0335 11/24/18 0443 11/25/18 0705 11/26/18 0414 11/27/18 0354  WBC 6.7 6.2  --  6.4  --  7.1 8.0  --   --   --   HGB 7.2* 6.6*   < > 7.0*   < > 7.8* 8.1* 7.5* 7.5* 8.1*  HCT 26.4* 23.8*   < > 23.7*   < > 26.9* 26.9* 24.8* 25.0* 27.0*  MCV 78.1* 77.3*  --  76.5*  --  77.1* 77.3*  --   --   --   PLT 294 248  --  239  --  276 305  --   --   --    < > = values in this interval not displayed.   Cardiac Enzymes: Recent Labs  Lab 11/20/18 1717 11/20/18 2242  TROPONINI 0.04* 0.04*   BNP: Invalid input(s): POCBNP CBG: Recent Labs  Lab 11/26/18 1123 11/26/18 1550 11/26/18 1740 11/26/18 2123 11/27/18 0803  GLUCAP 162* 135* 130* 227* 130*   D-Dimer No results for input(s): DDIMER in the last 72 hours. Hgb A1c No results for input(s): HGBA1C in the last 72 hours. Lipid Profile No results for input(s): CHOL, HDL, LDLCALC, TRIG, CHOLHDL, LDLDIRECT in the last 72 hours. Thyroid function studies No results for input(s): TSH, T4TOTAL, T3FREE, THYROIDAB in the last 72 hours.  Invalid input(s): FREET3 Anemia work up No results for input(s): VITAMINB12, FOLATE, FERRITIN, TIBC, IRON, RETICCTPCT in the last 72 hours. Urinalysis    Component Value Date/Time   COLORURINE STRAW (A) 11/21/2018 2018   APPEARANCEUR CLEAR 11/21/2018 2018   LABSPEC 1.005 11/21/2018 2018   PHURINE  7.0 11/21/2018 2018   GLUCOSEU NEGATIVE 11/21/2018 2018   GLUCOSEU NEGATIVE 05/10/2016 1434   [place] 11/21/2018 2018   BILIRUBINUR NEGATIVE 11/21/2018 2018   KETONESUR NEGATIVE 11/21/2018 2018   PROTEINUR 30 (A) 11/21/2018 2018   UROBILINOGEN 0.2 05/10/2016 1434   NITRITE NEGATIVE 11/21/2018 2018   LEUKOCYTESUR NEGATIVE  11/21/2018 2018   Sepsis Labs Invalid input(s): PROCALCITONIN,  WBC,  LACTICIDVEN Microbiology Recent Results (from the past 240 hour(s))  MRSA PCR Screening     Status: None   Collection Time: 11/21/18 12:25 AM  Result Value Ref Range Status   MRSA by PCR NEGATIVE NEGATIVE Final    Comment:        The GeneXpert MRSA Assay (FDA approved for NASAL specimens only), is one component of a comprehensive MRSA colonization surveillance program. It is not intended to diagnose MRSA infection nor to guide or monitor treatment for MRSA infections. QUESTIONABLE RESULTS, RECOMMEND RECOLLECT TO VERIFY NOTIFIED [place] RN 11/21/2018 AT 0500 SKEEN,P Performed at [facility], [place] [address], [place], [place] [address]   MRSA PCR Screening     Status: None   Collection Time: 11/21/18  5:12 AM  Result Value Ref Range Status   MRSA by PCR NEGATIVE NEGATIVE Final    Comment:        The GeneXpert MRSA Assay (FDA approved for NASAL specimens only), is one component of a comprehensive MRSA colonization surveillance program. It is not intended to diagnose MRSA infection nor to guide or monitor treatment for MRSA infections. Performed at [facility], [place] [address] [place], [place] [address]   Gram stain     Status: None   Collection Time: 11/21/18 12:03 PM  Result Value Ref Range Status   Specimen Description FLUID PLEURAL LEFT  Final   Special Requests NONE  Final   Gram Stain   Final    CYTOSPIN SMEAR WBC PRESENT,BOTH PMN AND MONONUCLEAR NO ORGANISMS SEEN Performed at [facility], [place] [address] [place], [place] [address]    Report Status 11/21/2018 FINAL  Final  Acid Fast Smear (AFB)     Status: None   Collection Time: 11/21/18 12:03 PM  Result Value Ref Range Status   AFB Specimen Processing Concentration  Final   Acid Fast Smear Negative  Final    Comment: (NOTE) Performed At: [facility] [place], [place] [id number] Rush Farmer MD CV:[id number]    Source (AFB) FLUID  Final    Comment: PLEURAL LEFT Performed at [facility], [place] [address], [place], [place] [address]   Culture, body fluid-bottle     Status: None   Collection Time: 11/21/18 12:03 PM  Result Value Ref Range Status   Specimen Description FLUID PLEURAL LEFT  Final   Special Requests NONE  Final   Culture   Final    NO GROWTH 5 DAYS Performed at [place] [address], [place], [place] [address]    Report Status 11/26/2018 FINAL  Final    Time spent: 30 min  SIGNED:   Marylu Lund, MD  [facility] 11/27/2018, 10:41 AM  If 7PM-7AM, please contact night-coverage

## 2018-11-28 ENCOUNTER — Telehealth: Payer: Self-pay | Admitting: *Deleted

## 2018-11-28 NOTE — Telephone Encounter (Signed)
Pt was on TCM report admitted 11/20/18 for Pleural effusion on left. He presents to the ED [place] progressive dyspnea on exertion and lower extremity edema. Patient states that he has not been taking any medications for about 6 months now. He was incarcerated at [facility] jail on 08/21/2018 and since that time has not been given any medications. Pt D/C 11/27/18 back to [facility]. Not able to f/u w/PCP in a week.Marland KitchenJohny Chess

## 2018-11-29 ENCOUNTER — Encounter (HOSPITAL_COMMUNITY): Payer: Self-pay

## 2018-11-29 ENCOUNTER — Inpatient Hospital Stay (HOSPITAL_COMMUNITY)
Admission: EM | Admit: 2018-11-29 | Discharge: 2018-12-09 | DRG: 178 | Disposition: A | Discharge status: Home Health | Payer: Medicare Other | Attending: Internal Medicine | Admitting: Internal Medicine

## 2018-11-29 ENCOUNTER — Other Ambulatory Visit: Payer: Self-pay

## 2018-11-29 DIAGNOSIS — J90 Pleural effusion, not elsewhere classified: Secondary | ICD-10-CM

## 2018-11-29 DIAGNOSIS — E785 Hyperlipidemia, unspecified: Secondary | ICD-10-CM | POA: Diagnosis present

## 2018-11-29 DIAGNOSIS — J918 Pleural effusion in other conditions classified elsewhere: Secondary | ICD-10-CM | POA: Diagnosis not present

## 2018-11-29 DIAGNOSIS — R52 Pain, unspecified: Secondary | ICD-10-CM | POA: Diagnosis not present

## 2018-11-29 DIAGNOSIS — I341 Nonrheumatic mitral (valve) prolapse: Secondary | ICD-10-CM | POA: Diagnosis present

## 2018-11-29 DIAGNOSIS — Z9114 Patient's other noncompliance with medication regimen: Secondary | ICD-10-CM

## 2018-11-29 DIAGNOSIS — I10 Essential (primary) hypertension: Secondary | ICD-10-CM | POA: Diagnosis not present

## 2018-11-29 DIAGNOSIS — Z923 Personal history of irradiation: Secondary | ICD-10-CM

## 2018-11-29 DIAGNOSIS — Z9119 Patient's noncompliance with other medical treatment and regimen: Secondary | ICD-10-CM

## 2018-11-29 DIAGNOSIS — Z720 Tobacco use: Secondary | ICD-10-CM | POA: Diagnosis present

## 2018-11-29 DIAGNOSIS — E039 Hypothyroidism, unspecified: Secondary | ICD-10-CM | POA: Diagnosis present

## 2018-11-29 DIAGNOSIS — Z8249 Family history of ischemic heart disease and other diseases of the circulatory system: Secondary | ICD-10-CM

## 2018-11-29 DIAGNOSIS — Z801 Family history of malignant neoplasm of trachea, bronchus and lung: Secondary | ICD-10-CM

## 2018-11-29 DIAGNOSIS — J189 Pneumonia, unspecified organism: Secondary | ICD-10-CM

## 2018-11-29 DIAGNOSIS — R7303 Prediabetes: Secondary | ICD-10-CM | POA: Diagnosis present

## 2018-11-29 DIAGNOSIS — Z66 Do not resuscitate: Secondary | ICD-10-CM | POA: Diagnosis present

## 2018-11-29 DIAGNOSIS — J440 Chronic obstructive pulmonary disease with acute lower respiratory infection: Secondary | ICD-10-CM | POA: Diagnosis not present

## 2018-11-29 DIAGNOSIS — I5042 Chronic combined systolic (congestive) and diastolic (congestive) heart failure: Secondary | ICD-10-CM | POA: Diagnosis present

## 2018-11-29 DIAGNOSIS — R0602 Shortness of breath: Secondary | ICD-10-CM | POA: Diagnosis not present

## 2018-11-29 DIAGNOSIS — C799 Secondary malignant neoplasm of unspecified site: Secondary | ICD-10-CM | POA: Diagnosis present

## 2018-11-29 DIAGNOSIS — C3490 Malignant neoplasm of unspecified part of unspecified bronchus or lung: Secondary | ICD-10-CM

## 2018-11-29 DIAGNOSIS — J852 Abscess of lung without pneumonia: Secondary | ICD-10-CM | POA: Diagnosis not present

## 2018-11-29 DIAGNOSIS — Z833 Family history of diabetes mellitus: Secondary | ICD-10-CM

## 2018-11-29 DIAGNOSIS — E119 Type 2 diabetes mellitus without complications: Secondary | ICD-10-CM

## 2018-11-29 DIAGNOSIS — Z6827 Body mass index (BMI) 27.0-27.9, adult: Secondary | ICD-10-CM

## 2018-11-29 DIAGNOSIS — R531 Weakness: Secondary | ICD-10-CM

## 2018-11-29 DIAGNOSIS — F17210 Nicotine dependence, cigarettes, uncomplicated: Secondary | ICD-10-CM | POA: Diagnosis present

## 2018-11-29 DIAGNOSIS — R918 Other nonspecific abnormal finding of lung field: Secondary | ICD-10-CM

## 2018-11-29 DIAGNOSIS — C3412 Malignant neoplasm of upper lobe, left bronchus or lung: Secondary | ICD-10-CM | POA: Diagnosis present

## 2018-11-29 DIAGNOSIS — N179 Acute kidney failure, unspecified: Secondary | ICD-10-CM | POA: Diagnosis present

## 2018-11-29 DIAGNOSIS — I110 Hypertensive heart disease with heart failure: Secondary | ICD-10-CM | POA: Diagnosis present

## 2018-11-29 DIAGNOSIS — B182 Chronic viral hepatitis C: Secondary | ICD-10-CM | POA: Diagnosis present

## 2018-11-29 DIAGNOSIS — Z9221 Personal history of antineoplastic chemotherapy: Secondary | ICD-10-CM

## 2018-11-29 DIAGNOSIS — Z8673 Personal history of transient ischemic attack (TIA), and cerebral infarction without residual deficits: Secondary | ICD-10-CM

## 2018-11-29 DIAGNOSIS — C3492 Malignant neoplasm of unspecified part of left bronchus or lung: Secondary | ICD-10-CM | POA: Diagnosis present

## 2018-11-29 DIAGNOSIS — Z8711 Personal history of peptic ulcer disease: Secondary | ICD-10-CM

## 2018-11-29 DIAGNOSIS — R64 Cachexia: Secondary | ICD-10-CM | POA: Diagnosis present

## 2018-11-29 DIAGNOSIS — D630 Anemia in neoplastic disease: Secondary | ICD-10-CM | POA: Diagnosis present

## 2018-11-29 DIAGNOSIS — Z86718 Personal history of other venous thrombosis and embolism: Secondary | ICD-10-CM

## 2018-11-29 LAB — CBC
HCT: 29.4 % — ABNORMAL LOW (ref 39.0–52.0)
Hemoglobin: 8.5 g/dL — ABNORMAL LOW (ref 13.0–17.0)
MCH: 22.9 pg — ABNORMAL LOW (ref 26.0–34.0)
MCHC: 28.9 g/dL — ABNORMAL LOW (ref 30.0–36.0)
MCV: 79.2 fL — ABNORMAL LOW (ref 80.0–100.0)
Platelets: 333 10*3/uL (ref 150–400)
RBC: 3.71 MIL/uL — ABNORMAL LOW (ref 4.22–5.81)
RDW: 17 % — ABNORMAL HIGH (ref 11.5–15.5)
WBC: 7.8 10*3/uL (ref 4.0–10.5)
nRBC: 0 % (ref 0.0–0.2)

## 2018-11-29 LAB — BASIC METABOLIC PANEL
Anion gap: 9 (ref 5–15)
BUN: 31 mg/dL — ABNORMAL HIGH (ref 8–23)
CO2: 30 mmol/L (ref 22–32)
Calcium: 8.3 mg/dL — ABNORMAL LOW (ref 8.9–10.3)
Chloride: 97 mmol/L — ABNORMAL LOW (ref 98–111)
Creatinine, Ser: 1.72 mg/dL — ABNORMAL HIGH (ref 0.61–1.24)
GFR calc Af Amer: 49 mL/min — ABNORMAL LOW (ref >60)
GFR calc non Af Amer: 42 mL/min — ABNORMAL LOW (ref >60)
Glucose, Bld: 121 mg/dL — ABNORMAL HIGH (ref 70–99)
Potassium: 3.4 mmol/L — ABNORMAL LOW (ref 3.5–5.1)
Sodium: 136 mmol/L (ref 135–145)

## 2018-11-29 LAB — URINALYSIS, ROUTINE W REFLEX MICROSCOPIC
Bilirubin Urine: NEGATIVE
Glucose, UA: 50 mg/dL — AB
Hgb urine dipstick: NEGATIVE
Ketones, ur: NEGATIVE mg/dL
Nitrite: NEGATIVE
Protein, ur: 100 mg/dL — AB
Specific Gravity, Urine: 1.013 (ref 1.005–1.030)
pH: 6 (ref 5.0–8.0)

## 2018-11-29 MED ORDER — SODIUM CHLORIDE 0.9% FLUSH: 3.0000 mL | Freq: Once | INTRAVENOUS | Status: DC

## 2018-11-29 NOTE — ED Triage Notes (Signed)
Pt here for generalized weakness for the last 2 days.  Heaviness in all extremities.  VAN negative.  Able to walk but is slower at walking. A&Ox4.

## 2018-11-30 ENCOUNTER — Observation Stay (HOSPITAL_COMMUNITY): Payer: Medicare Other

## 2018-11-30 ENCOUNTER — Emergency Department (HOSPITAL_COMMUNITY): Payer: Medicare Other

## 2018-11-30 ENCOUNTER — Encounter (HOSPITAL_COMMUNITY): Payer: Self-pay | Admitting: Internal Medicine

## 2018-11-30 DIAGNOSIS — J90 Pleural effusion, not elsewhere classified: Secondary | ICD-10-CM | POA: Diagnosis not present

## 2018-11-30 DIAGNOSIS — R0602 Shortness of breath: Secondary | ICD-10-CM | POA: Diagnosis not present

## 2018-11-30 DIAGNOSIS — M899 Disorder of bone, unspecified: Secondary | ICD-10-CM | POA: Diagnosis not present

## 2018-11-30 DIAGNOSIS — K5730 Diverticulosis of large intestine without perforation or abscess without bleeding: Secondary | ICD-10-CM | POA: Diagnosis not present

## 2018-11-30 DIAGNOSIS — Z85118 Personal history of other malignant neoplasm of bronchus and lung: Secondary | ICD-10-CM | POA: Diagnosis not present

## 2018-11-30 DIAGNOSIS — I5042 Chronic combined systolic (congestive) and diastolic (congestive) heart failure: Secondary | ICD-10-CM | POA: Diagnosis present

## 2018-11-30 DIAGNOSIS — R918 Other nonspecific abnormal finding of lung field: Secondary | ICD-10-CM | POA: Diagnosis not present

## 2018-11-30 DIAGNOSIS — R531 Weakness: Secondary | ICD-10-CM | POA: Diagnosis not present

## 2018-11-30 LAB — GLUCOSE, CAPILLARY
Glucose-Capillary: 98 mg/dL (ref 70–99)
Glucose-Capillary: 187 mg/dL — ABNORMAL HIGH (ref 70–99)
Glucose-Capillary: 126 mg/dL — ABNORMAL HIGH (ref 70–99)

## 2018-11-30 LAB — BODY FLUID CELL COUNT WITH DIFFERENTIAL
Lymphs, Fluid: 60 %
Monocyte-Macrophage-Serous Fluid: 25 % — ABNORMAL LOW (ref 50–90)
Neutrophil Count, Fluid: 15 % (ref 0–25)
Total Nucleated Cell Count, Fluid: 31 cu mm (ref 0–1000)

## 2018-11-30 LAB — LACTATE DEHYDROGENASE, PLEURAL OR PERITONEAL FLUID: LD, Fluid: 115 U/L — ABNORMAL HIGH (ref 3–23)

## 2018-11-30 LAB — BRAIN NATRIURETIC PEPTIDE: B Natriuretic Peptide: 717.8 pg/mL — ABNORMAL HIGH (ref 0.0–100.0)

## 2018-11-30 LAB — TROPONIN I
Troponin I: 0.06 ng/mL (ref <0.03)
Troponin I: 0.06 ng/mL (ref <0.03)

## 2018-11-30 MED ORDER — ACETAMINOPHEN 325 MG PO TABS
650.0000 mg | ORAL_TABLET | Freq: Four times a day (QID) | ORAL | Status: DC | PRN
  Administered 2018-11-30 – 2018-12-03 (×3): 650 mg via ORAL
  Filled 2018-11-30 (×3): qty 2

## 2018-11-30 MED ORDER — IOHEXOL 300 MG/ML  SOLN
75.0000 mL | Freq: Once | INTRAMUSCULAR | Status: AC | PRN
  Administered 2018-11-30: 11:00:00 75 mL via INTRAVENOUS

## 2018-11-30 MED ORDER — PANTOPRAZOLE SODIUM 40 MG PO TBEC
40.0000 mg | DELAYED_RELEASE_TABLET | Freq: Every day | ORAL | Status: DC
  Administered 2018-11-30 – 2018-12-07 (×8): 40 mg via ORAL
  Filled 2018-11-30 (×9): qty 1

## 2018-11-30 MED ORDER — ENOXAPARIN SODIUM 40 MG/0.4ML ~~LOC~~ SOLN
40.0000 mg | SUBCUTANEOUS | Status: DC
  Administered 2018-12-01 – 2018-12-06 (×6): 40 mg via SUBCUTANEOUS
  Filled 2018-11-30 (×8): qty 0.4

## 2018-11-30 MED ORDER — ONDANSETRON HCL 4 MG/2ML IJ SOLN: 4.0000 mg | Freq: Four times a day (QID) | INTRAMUSCULAR | Status: DC | PRN

## 2018-11-30 MED ORDER — FERROUS SULFATE 325 (65 FE) MG PO TABS
325.0000 mg | ORAL_TABLET | Freq: Three times a day (TID) | ORAL | Status: DC
  Administered 2018-11-30 – 2018-12-09 (×26): 325 mg via ORAL
  Filled 2018-11-30 (×26): qty 1

## 2018-11-30 MED ORDER — ENOXAPARIN SODIUM 40 MG/0.4ML ~~LOC~~ SOLN: 40.0000 mg | SUBCUTANEOUS | Status: DC

## 2018-11-30 MED ORDER — HYDRALAZINE HCL 20 MG/ML IJ SOLN
5.0000 mg | INTRAMUSCULAR | Status: DC | PRN
  Administered 2018-12-01: 5 mg via INTRAVENOUS
  Filled 2018-11-30: qty 1

## 2018-11-30 MED ORDER — DOCUSATE SODIUM 100 MG PO CAPS
100.0000 mg | ORAL_CAPSULE | Freq: Two times a day (BID) | ORAL | Status: DC
  Administered 2018-11-30 – 2018-12-09 (×15): 100 mg via ORAL
  Filled 2018-11-30 (×17): qty 1

## 2018-11-30 MED ORDER — LEVOTHYROXINE SODIUM 112 MCG PO TABS
112.0000 ug | ORAL_TABLET | Freq: Every day | ORAL | Status: DC
  Administered 2018-12-01 – 2018-12-08 (×8): 112 ug via ORAL
  Filled 2018-11-30 (×8): qty 1

## 2018-11-30 MED ORDER — UMECLIDINIUM-VILANTEROL 62.5-25 MCG/INH IN AEPB
1.0000 | INHALATION_SPRAY | Freq: Every day | RESPIRATORY_TRACT | Status: DC
  Administered 2018-12-01 – 2018-12-09 (×9): 1 via RESPIRATORY_TRACT
  Filled 2018-11-30 (×2): qty 14

## 2018-11-30 MED ORDER — NICOTINE 14 MG/24HR TD PT24
14.0000 mg | MEDICATED_PATCH | Freq: Every day | TRANSDERMAL | Status: DC
  Administered 2018-11-30 – 2018-12-09 (×10): 14 mg via TRANSDERMAL
  Filled 2018-11-30 (×10): qty 1

## 2018-11-30 MED ORDER — ASPIRIN EC 81 MG PO TBEC
81.0000 mg | DELAYED_RELEASE_TABLET | Freq: Every day | ORAL | Status: DC
  Administered 2018-11-30 – 2018-12-09 (×10): 81 mg via ORAL
  Filled 2018-11-30 (×10): qty 1

## 2018-11-30 MED ORDER — METOPROLOL SUCCINATE ER 25 MG PO TB24
25.0000 mg | ORAL_TABLET | Freq: Every day | ORAL | Status: DC
  Administered 2018-11-30 – 2018-12-09 (×10): 25 mg via ORAL
  Filled 2018-11-30 (×11): qty 1

## 2018-11-30 MED ORDER — LACTATED RINGERS IV SOLN
INTRAVENOUS | Status: AC
  Administered 2018-11-30: 15:00:00 via INTRAVENOUS

## 2018-11-30 MED ORDER — SODIUM CHLORIDE 0.9 % IV SOLN
3.0000 g | Freq: Four times a day (QID) | INTRAVENOUS | Status: DC
  Administered 2018-11-30 – 2018-12-07 (×28): 3 g via INTRAVENOUS
  Filled 2018-11-30 (×34): qty 3

## 2018-11-30 MED ORDER — ACETAMINOPHEN 650 MG RE SUPP: 650.0000 mg | Freq: Four times a day (QID) | RECTAL | Status: DC | PRN

## 2018-11-30 MED ORDER — ONDANSETRON HCL 4 MG PO TABS: 4.0000 mg | ORAL_TABLET | Freq: Four times a day (QID) | ORAL | Status: DC | PRN

## 2018-11-30 MED ORDER — LIDOCAINE HCL (PF) 1 % IJ SOLN
INTRAMUSCULAR | Status: AC
  Filled 2018-11-30: qty 30

## 2018-11-30 NOTE — ED Notes (Signed)
ED TO INPATIENT HANDOFF REPORT  ED Nurse Name and Phone #:  Lowella Petties [phone]  S Name/Age/Gender Brad [name] 63 y.o. male Room/Bed: 017C/017C  Code Status   Code Status: Prior  Home/SNF/Other Home Patient oriented to: self, place, time and situation Is this baseline? Yes   Triage Complete: Triage complete  Chief Complaint GENERALIZED WEAKNESS  Triage Note Pt here for generalized weakness for the last 2 days.  Heaviness in all extremities.  VAN negative.  Able to walk but is slower at walking. A&Ox4.    Allergies Allergies  Allergen Reactions  . Doxycycline Hives and Itching  . Lipitor [Atorvastatin] Other (See Comments)    Myalgia   . Oxycodone Itching    Level of Care/Admitting Diagnosis ED Disposition    ED Disposition Condition [facility] Area: [place] [100100]  Level of Care: Med-Surg [16]  I expect the patient will be discharged within 24 hours: No (not a candidate for 5C-Observation unit)  Covid Evaluation: N/A  Diagnosis: Weakness [454098]  Admitting Physician: Karmen Bongo [[id number]]  Attending Physician: Karmen Bongo [[id number]]  PT Class (Do Not Modify): Observation [104]  PT Acc Code (Do Not Modify): Observation [10022]       B Medical/Surgery History Past Medical History:  Diagnosis Date  . Anemia    takes Ferrous Sulfated daily  . Arthritis   . Carotid stenosis 08/27/2014   takes Aspirin daily  . Chronic hepatitis C ([place]) 09/17/2015  . COPD (chronic obstructive pulmonary disease) (HCC)    Albuterol daily as needed and Spiriva daily  . Depression   . Diabetes mellitus type II    takes Metformin and Glipizide daily  . Dizziness   . GERD (gastroesophageal reflux disease)   . GI bleed 12/2015  . Headache    occasionally  . History of blood clots    in right leg;being followed by Dr.Brabham for this  . History of blood transfusion 09/2014   2 units-no abnormal reaction noted  . History of gastric ulcer    . History of hiatal hernia   . Hyperlipidemia 08/27/2014   takes Simvastatin daily  . Hypertension    takes Amlodipine and Lisinopril daily  . Hypothyroidism 08/27/2014   takes Synthroid daily  . Insomnia    takes Pamelor nightly  . Joint pain    hips  . Muscle spasm    takes Zanaflex daily as needed  . MVP (mitral valve prolapse)   . Peripheral edema    takes Furosemide daily as needed  . Peripheral neuropathy   . Pleural effusion 11/2018  . Prurigo nodularis   . Stroke [facility])    Past Surgical History:  Procedure Laterality Date  . ENDARTERECTOMY Left 11/13/2014   Procedure: ENDARTERECTOMY CAROTID WITH PATCH ANGIOPLASTY;  Surgeon: Serafina Mitchell, MD;  Location: [facility] OR;  Service: Vascular;  Laterality: Left;  . ESOPHAGOGASTRODUODENOSCOPY N/A 09/24/2014   Procedure: ESOPHAGOGASTRODUODENOSCOPY (EGD);  Surgeon: Winfield Cunas., MD;  Location[facility] ENDOSCOPY;  Service: Endoscopy;  Laterality: N/A;  . FUDUCIAL PLACEMENT N/A 06/26/2017   Procedure: PLACEMENT OF FUDUCIAL;  Surgeon: Grace Isaac, MD;  Location: [facility];  Service: Thoracic;  Laterality: N/A;  . I&D of right arm    . INCISION AND DRAINAGE PERIRECTAL ABSCESS  03/2009  . IR THORACENTESIS ASP PLEURAL SPACE W/IMG GUIDE  11/21/2018  . Neg Stress Test  2000  . PERIPHERAL VASCULAR CATHETERIZATION N/A 11/30/2015   Procedure: Carotid PTA/Stent Intervention;  Surgeon:  Serafina Mitchell, MD;  Location: [facility] LAB;  Service: Cardiovascular;  Laterality: N/A;  . ROTATOR CUFF REPAIR Right 2009   multiple f/u Sxs/infection Tx  . VASCULAR SURGERY    . VIDEO BRONCHOSCOPY WITH ENDOBRONCHIAL NAVIGATION N/A 05/25/2017   Procedure: VIDEO BRONCHOSCOPY WITH ENDOBRONCHIAL NAVIGATION;  Surgeon: Rigoberto Noel, MD;  Location: [place];  Service: Thoracic;  Laterality: N/A;  . VIDEO BRONCHOSCOPY WITH ENDOBRONCHIAL NAVIGATION N/A 06/26/2017   Procedure: VIDEO BRONCHOSCOPY WITH ENDOBRONCHIAL NAVIGATION WITH PLACEMENT OF FIDUCIALS;  Surgeon:  Grace Isaac, MD;  Location: [facility];  Service: Thoracic;  Laterality: N/A;  . VIDEO BRONCHOSCOPY WITH ENDOBRONCHIAL ULTRASOUND N/A 05/25/2017   Procedure: VIDEO BRONCHOSCOPY WITH ENDOBRONCHIAL ULTRASOUND;  Surgeon: Rigoberto Noel, MD;  Location: [facility];  Service: Thoracic;  Laterality: N/A;     A IV Location/Drains/Wounds Patient Lines/Drains/Airways Status   Active Line/Drains/Airways    Name:   Placement date:   Placement time:   Site:   Days:   Peripheral IV 11/30/18 Left Forearm   11/30/18    -    Forearm   less than 1   Incision (Closed) 11/13/14 Neck Left   11/13/14    0723     1478   Incision (Closed) 05/25/17 N/A Other (Comment)   05/25/17    1500     554   Wound / Incision (Open or Dehisced) 11/21/18 Thigh Left;Upper;Lateral pt claimed it's a pressure sore, measured 1 cm x 1 cm   11/21/18    0018    Thigh   9          Intake/Output Last 24 hours No intake or output data in the 24 hours ending 11/30/18 0809  Labs/Imaging Results for orders placed or performed during the hospital encounter of 11/29/18 (from the past 48 hour(s))  Urinalysis, Routine w reflex microscopic     Status: Abnormal   Collection Time: 11/29/18  7:40 PM  Result Value Ref Range   Color, Urine YELLOW YELLOW   APPearance CLEAR CLEAR   Specific Gravity, Urine 1.013 1.005 - 1.030   pH 6.0 5.0 - 8.0   Glucose, UA 50 (A) NEGATIVE mg/dL   Hgb urine dipstick NEGATIVE NEGATIVE   Bilirubin Urine NEGATIVE NEGATIVE   Ketones, ur NEGATIVE NEGATIVE mg/dL   Protein, ur 100 (A) NEGATIVE mg/dL   Nitrite NEGATIVE NEGATIVE   Leukocytes,Ua TRACE (A) NEGATIVE   RBC / HPF 0-5 0 - 5 RBC/hpf   WBC, UA 6-10 0 - 5 WBC/hpf   Bacteria, UA FEW (A) NONE SEEN   Mucus PRESENT     Comment: Performed at [facility], [address], [place], [place] [address]  Basic metabolic panel     Status: Abnormal   Collection Time: 11/29/18  7:55 PM  Result Value Ref Range   Sodium 136 135 - 145 mmol/L   Potassium 3.4 (L) 3.5  - 5.1 mmol/L   Chloride 97 (L) 98 - 111 mmol/L   CO2 30 22 - 32 mmol/L   Glucose, Bld 121 (H) 70 - 99 mg/dL   BUN 31 (H) 8 - 23 mg/dL   Creatinine, Ser 1.72 (H) 0.61 - 1.24 mg/dL   Calcium 8.3 (L) 8.9 - 10.3 mg/dL   GFR calc non Af Amer 42 (L) >60 mL/min   GFR calc Af Amer 49 (L) >60 mL/min   Anion gap 9 5 - 15    Comment: Performed at [facility]  [address], [place], [place] [address]  CBC     Status: Abnormal   Collection Time: 11/29/18  7:55 PM  Result Value Ref Range   WBC 7.8 4.0 - 10.5 K/uL   RBC 3.71 (L) 4.22 - 5.81 MIL/uL   Hemoglobin 8.5 (L) 13.0 - 17.0 g/dL   HCT 29.4 (L) 39.0 - 52.0 %   MCV 79.2 (L) 80.0 - 100.0 fL   MCH 22.9 (L) 26.0 - 34.0 pg   MCHC 28.9 (L) 30.0 - 36.0 g/dL   RDW 17.0 (H) 11.5 - 15.5 %   Platelets 333 150 - 400 K/uL   nRBC 0.0 0.0 - 0.2 %    Comment: Performed at [facility], [place] [address] [place], [place] [address]  Troponin I - ONCE - STAT     Status: Abnormal   Collection Time: 11/30/18 12:18 AM  Result Value Ref Range   Troponin I 0.06 (HH) <0.03 ng/mL    Comment: CRITICAL RESULT CALLED TO, READ BACK BY AND VERIFIED WITH: Hope Pigeon B,RN 11/30/18 0131 WAYK Performed at [facility], [place] [address] [place], [place] [address]   Brain natriuretic peptide     Status: Abnormal   Collection Time: 11/30/18 12:18 AM  Result Value Ref Range   B Natriuretic Peptide 717.8 (H) 0.0 - 100.0 pg/mL    Comment: Performed at [facility] [address], [place], [place] [address]  Troponin I - ONCE - STAT     Status: Abnormal   Collection Time: 11/30/18  3:59 AM  Result Value Ref Range   Troponin I 0.06 (HH) <0.03 ng/mL    Comment: CRITICAL VALUE NOTED.  VALUE IS CONSISTENT WITH PREVIOUSLY REPORTED AND CALLED VALUE. Performed at [facility], [place] [address] [place], [place] [address]    Dg Chest 2 View  Result Date: 11/30/2018 CLINICAL DATA:  Shortness of breath EXAM: CHEST - 2 VIEW COMPARISON:  11/21/2018  FINDINGS: Continued near complete opacification of the left hemithorax with left pleural density and airspace disease. Right lung clear. There is cardiomegaly. No acute bony abnormality. IMPRESSION: Continued near complete opacification of the left hemithorax with diffuse left lung airspace disease and left pleural density, presumably effusion. Electronically Signed   By: Rolm Baptise M.D.   On: 11/30/2018 02:27    Pending Labs Unresulted Labs (From admission, onward)   None      Vitals/Pain Today's Vitals   11/30/18 0635 11/30/18 0700 11/30/18 0730 11/30/18 0800  BP:  (!) 187/91 (!) 184/72 (!) 186/96  Pulse:  81 77 76  Resp:  (!) 22 (!) 24 (!) 29  Temp:      TempSrc:      SpO2:  94% (!) 88% 93%  PainSc: Asleep       Isolation Precautions No active isolations  Medications Medications  sodium chloride flush (NS) 0.9 % injection 3 mL (3 mLs Intravenous Not Given 11/30/18 0517)    Mobility walks Low fall risk   Focused Assessments Neuro Assessment Handoff:  Swallow screen pass? not done   NIH Stroke Scale ( + Modified Stroke Scale Criteria)  LOC Questions (1b. )   +: Answers both questions correctly LOC Commands (1c. )   + : Performs both tasks correctly Best Gaze (2. )  +: Normal Visual (3. )  +: No visual loss Motor Arm, Left (5a. )   +: No drift Motor Arm, Right (5b. )   +: No drift Motor Leg, Left (6a. )   +:  Drift Motor Leg, Right (6b. )   +: Drift Sensory (8. )   +: Normal, no sensory loss Best Language (9. )   +: No aphasia Extinction/Inattention (11.)   +: No Abnormality Modified SS Total  +: 2     Neuro Assessment:   Neuro Checks:      Last Documented NIHSS Modified Score: 2 (11/30/18 0021) Has TPA been given? No If patient is a Neuro Trauma and patient is going to OR before floor call report to [place] nurse: [phone] or [phone]     R Recommendations: See Admitting Provider Note  Report given to:   Additional Notes:  Positive  troponin, no chest pain

## 2018-11-30 NOTE — Procedures (Signed)
PROCEDURE SUMMARY:  Successful image-guided left thoracentesis. Yielded 1.9 liters of golden fluid. Patient tolerated procedure well. EBL: Zero No immediate complications.  Specimen was sent for labs. Post procedure CXR shows no pneumothorax.  Please see imaging section of Epic for full dictation.  Joaquim Nam PA-C 11/30/2018 10:44 AM

## 2018-11-30 NOTE — Progress Notes (Signed)
Pharmacy Antibiotic Note  Brad [name] is a 63 y.o. male admitted on 11/29/2018 with lung abscess.  Pharmacy has been consulted for Unasyn dosing.  Scr elevated to 1.7 today, estimated CrCl ~ 49 ml/min.  Plan: Unasyn 3g IV q 6 hrs for now, will watch renal function carefully. F/u cultures, clinical course.  Height: 5\' 11"  (180.3 cm) Weight: 192 lb 9.6 oz (87.4 kg) IBW/kg (Calculated) : 75.3  Temp (24hrs), Avg:97.9 F (36.6 C), Min:97.5 F (36.4 C), Max:98.5 F (36.9 C)  Recent Labs  Lab 11/24/18 0443 11/25/18 0705 11/26/18 0414 11/27/18 0354 11/29/18 1955  WBC 8.0  --   --   --  7.8  CREATININE 1.29* 1.33* 1.58* 1.46* 1.72*    Estimated Creatinine Clearance: 48 mL/min (A) (by C-G formula based on SCr of 1.72 mg/dL (H)).    Allergies  Allergen Reactions  . Doxycycline Hives and Itching  . Lipitor [Atorvastatin] Other (See Comments)    Myalgia   . Oxycodone Itching    Antimicrobials this admission: Unasyn 4/25 >   Dose adjustments this admission:  Microbiology results: 4/25 Pleural fluid cx>   Thank you for allowing pharmacy to be a part of this patient's care.  Marguerite Olea, [facility] Clinical Pharmacist Phone [phone]  11/30/2018 1:39 PM

## 2018-11-30 NOTE — ED Notes (Signed)
Patient transported to X-ray 

## 2018-11-30 NOTE — ED Notes (Signed)
EDP notified on pt.'s elevated troponin result .

## 2018-11-30 NOTE — Evaluation (Signed)
Physical Therapy Evaluation Patient Details Name: Brad [name] MRN: [id number] DOB: 1955-09-19 Today's Date: 11/30/2018   History of Present Illness  Pt is a 63 y.o. male admitted 11/29/18 with generalized weakness; likely multifactorial due to sedentary, overdiuresis last admission and recurrent pleural effusion. S/p L-side thoracentesis 4/25. Of note, [facility] 4[facility]15-4/22 with likely CHF exacerbation; pt was reportedly d/c home on house arrest but unable to move. 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    Clinical Impression  Pt presents with an overall decrease in functional mobility secondary to above. Pt inconsistent historian; reports indep from home without difficulty and weakness new as of today, then reporting weakness since recent admission earlier in April. Today, pt able to amb short distance with min guard for balance; limited by generalized weakness and decreased activity tolerance. Pt with poor balance strategies, at high risk for falls. Discussed SNF vs. HH therapies; pt unsure. Will follow acutely to address established goals.     Follow Up Recommendations SNF;Supervision for mobility/OOB(pending pt progression)    Equipment Recommendations  Rolling walker with 5" wheels    Recommendations for Other Services       Precautions / Restrictions Precautions Precautions: Fall Restrictions Weight Bearing Restrictions: No      Mobility  Bed Mobility Overal bed mobility: Modified Independent             General bed mobility comments: Asking for help, but able to perform indep using momentum to power up  Transfers Overall transfer level: Needs assistance Equipment used: Rolling walker (2 wheeled) Transfers: Sit to/from Stand Sit to Stand: Supervision         General transfer comment: Stood from bed without device, supervision for balance; stood from chair to RW, reliant on UE support  to push into standing  Ambulation/Gait Ambulation/Gait assistance: Counsellor (Feet): 40 Feet Assistive device: Rolling walker (2 wheeled);IV Pole Gait Pattern/deviations: Step-through pattern;Decreased stride length;Trunk flexed Gait velocity: Decreased Gait velocity interpretation: <1.8 ft/sec, indicate of risk for recurrent falls General Gait Details: Initial amb without device holding onto IV pole and reaching for furniture to support other UE, close min guard for balance; additional amb with RW, intermittent min guard. Pt easily fatigued limiting further mobility  Stairs            Wheelchair Mobility    Modified Rankin (Stroke Patients Only)       Balance Overall balance assessment: Needs assistance   Sitting balance-Leahy Scale: Good       Standing balance-Leahy Scale: Fair Standing balance comment: Able to static stand without UE support; dynamic stability improved with UE support                             Pertinent Vitals/Pain Pain Assessment: No/denies pain    Home Living Family/patient expects to be discharged to:: Private residence Living Arrangements: Alone Available Help at Discharge: (pt unsure) Type of Home: House           Additional Comments: Unsure if reliable historian. Pt not forthcoming with details regarding home set-up    Prior Function Level of Independence: Independent         Comments: Unsure if reliable historian. Pt not forthcoming with details. Reports he starting feeling weak this morning at hospital but was moving fine at home; per chart, pt reports being unable to walk at home  Hand Dominance        Extremity/Trunk Assessment   Upper Extremity Assessment Upper Extremity Assessment: Generalized weakness;RUE deficits/detail;LUE deficits/detail RUE Deficits / Details: Shoulder active flex/abd <90', strength <3/5 (rotator cuff issue?); pt not forthcoming with symptom history LUE Deficits /  Details: Shoulder active flex/abd <90', strength <3/5 (rotator cuff issue?); pt not forthcoming with symptom history    Lower Extremity Assessment Lower Extremity Assessment: RLE deficits/detail;LLE deficits/detail RLE Deficits / Details: 4/5 throughout LLE Deficits / Details: 4/5 throughout       Communication   Communication: No difficulties  Cognition Arousal/Alertness: Awake/alert Behavior During Therapy: Flat affect Overall Cognitive Status: No family/caregiver present to determine baseline cognitive functioning                                 General Comments: Increased time to get moving. Unsure if pt reliable historian, providing inconsistent subjective information. Following commands appropriately      General Comments      Exercises     Assessment/Plan    PT Assessment Patient needs continued PT services  PT Problem List Decreased strength;Decreased activity tolerance;Decreased balance;Decreased mobility;Decreased range of motion       PT Treatment Interventions DME instruction;Gait training;Stair training;Therapeutic activities;Functional mobility training;Therapeutic exercise;Balance training;Patient/family education    PT Goals (Current goals can be found in the Care Plan section)  Acute Rehab PT Goals Patient Stated Goal: Pt unsure about SNF stating, "Let's see how I feel in a few days" PT Goal Formulation: With patient Time For Goal Achievement: 12/14/18 Potential to Achieve Goals: Good    Frequency Min 3X/week   Barriers to discharge Decreased caregiver support      Co-evaluation               AM-PAC PT "6 Clicks" Mobility  Outcome Measure Help needed turning from your back to your side while in a flat bed without using bedrails?: None Help needed moving from lying on your back to sitting on the side of a flat bed without using bedrails?: None Help needed moving to and from a bed to a chair (including a wheelchair)?: A  Little Help needed standing up from a chair using your arms (e.g., wheelchair or bedside chair)?: A Little Help needed to walk in hospital room?: A Little Help needed climbing 3-5 steps with a railing? : A Little 6 Click Score: 20    End of Session Equipment Utilized During Treatment: Gait belt Activity Tolerance: Patient limited by fatigue Patient left: in chair;with call bell/phone within reach;with chair alarm set Nurse Communication: Mobility status PT Visit Diagnosis: Other abnormalities of gait and mobility (R26.89);Muscle weakness (generalized) (M62.81)    Time: 1510-1536 PT Time Calculation (min) (ACUTE ONLY): 26 min   Charges:   PT Evaluation $PT Eval Moderate Complexity: 1 Mod PT Treatments $Gait Training: 8-22 mins      Mabeline Caras, PT, DPT Acute Rehabilitation Services  Pager [phone] Office [place] 11/30/2018, 4:35 PM

## 2018-11-30 NOTE — ED Provider Notes (Signed)
[facility] EMERGENCY DEPARTMENT Provider Note   CSN: [id number] Arrival date & time: 11/29/18  1935    History   Chief Complaint Chief Complaint  Patient presents with  . Weakness    HPI Brad [name] is a 63 y.o. male.     Patient presents to the emergency department for evaluation of generalized weakness.  Patient reports that he was just hospitalized for heart failure.  He went home from the hospital 2 days ago.  Reports that since he has gone home he has been unable to ambulate because his legs are weak.  He feels like he gets very short of breath if he tries to exert himself.  At rest his breathing is normal for him, however.  He has not had any chest pain.  The bowing of his legs is improved after hospitalization.  He has not had any cough, fever or infection symptoms.  He denies abdominal pain, nausea, vomiting, diarrhea.     Past Medical History:  Diagnosis Date  . Anemia    takes Ferrous Sulfated daily  . Arthritis   . Carotid stenosis 08/27/2014   takes Aspirin daily  . Chronic hepatitis C ([place]) 09/17/2015  . COPD (chronic obstructive pulmonary disease) (HCC)    Albuterol daily as needed and Spiriva daily  . Depression   . Diabetes mellitus type II    takes Metformin and Glipizide daily  . Dizziness   . GERD (gastroesophageal reflux disease)   . GI bleed 12/2015  . Headache    occasionally  . History of blood clots    in right leg;being followed by Dr.Brabham for this  . History of blood transfusion 09/2014   2 units-no abnormal reaction noted  . History of gastric ulcer   . History of hiatal hernia   . HTN (hypertension) 09/14/2015  . Hyperlipidemia 08/27/2014   takes Simvastatin daily  . Hypertension    takes Amlodipine and Lisinopril daily  . Hypothyroidism 08/27/2014   takes Synthroid daily  . Insomnia    takes Pamelor nightly  . Joint pain    hips  . Muscle spasm    takes Zanaflex daily as needed  . MVP (mitral valve  prolapse)   . Peripheral edema    takes Furosemide daily as needed  . Peripheral neuropathy   . Pleural effusion 11/2018  . Prurigo nodularis   . Shortness of breath dyspnea   . Stroke ([place])   . Urinary frequency   . Urinary urgency     Patient Active Problem List   Diagnosis Date Noted  . AKI (acute kidney injury) ([place]) 11/26/2018  . Acute exacerbation of CHF (congestive heart failure) ([place]) 11/21/2018  . Encounter for antineoplastic chemotherapy 08/27/2017  . Stage III squamous cell carcinoma of left lung ([place]) 06/29/2017  . Mediastinal lymphadenopathy   . Pleural effusion on left   . Lung mass 04/27/2017  . Abnormal chest x-ray 04/13/2017  . Loss of weight 05/10/2016  . Grief reaction 05/10/2016  . Preop exam for internal medicine 05/10/2016  . Liver fibrosis 03/09/2016  . Smoker 01/23/2016  . Peripheral vascular disease ([place]) 01/19/2016  . GI bleed 12/08/2015  . Acute on chronic anemia 12/08/2015  . Diastolic CHF ([place]) 61/95/0932  . Hypersomnolence 12/07/2015  . Carotid stenosis, symptomatic, with infarction ([place]) 11/30/2015  . HLD (hyperlipidemia) 11/12/2015  . S/P carotid endarterectomy 11/12/2015  . Secondary cardiomyopathy ([place]) 10/14/2015  . Acute CVA (cerebrovascular accident) ([place]) 09/28/2015  . Stroke (cerebrum) ([place])  09/27/2015  . Facial droop 09/27/2015  . Chronic hepatitis C ([place]) 09/17/2015  . Skin lesion 09/14/2015  . Insect bite, infected 09/14/2015  . Left shoulder pain 06/08/2015  . Hypoalbuminemia 02/17/2015  . Elevated alkaline phosphatase level 02/17/2015  . Essential hypertension 02/16/2015  . Carotid stenosis 11/13/2014  . Atherosclerotic peripheral vascular disease with intermittent claudication ([place])   . Duodenal ulcer disease   . Anemia   . Tobacco abuse 09/23/2014  . Hypothyroidism 08/27/2014  . Bilateral carotid artery stenosis   . Diabetes ([place]) 05/25/2014  . Hypertensive heart disease   . History of CVA (cerebrovascular accident)  without residual deficits   . Obesity (BMI 30-39.9) 01/19/2014  . Erectile dysfunction 10/10/2013  . Chronic LBP   . COPD (chronic obstructive pulmonary disease) ([place]) 03/17/2010  . GERD 03/17/2010    Past Surgical History:  Procedure Laterality Date  . ENDARTERECTOMY Left 11/13/2014   Procedure: ENDARTERECTOMY CAROTID WITH PATCH ANGIOPLASTY;  Surgeon: Serafina Mitchell, MD;  Location: [facility] OR;  Service: Vascular;  Laterality: Left;  . ESOPHAGOGASTRODUODENOSCOPY N/A 09/24/2014   Procedure: ESOPHAGOGASTRODUODENOSCOPY (EGD);  Surgeon: Winfield Cunas., MD;  Location[facility] ENDOSCOPY;  Service: Endoscopy;  Laterality: N/A;  . FUDUCIAL PLACEMENT N/A 06/26/2017   Procedure: PLACEMENT OF FUDUCIAL;  Surgeon: Grace Isaac, MD;  Location: [facility];  Service: Thoracic;  Laterality: N/A;  . I&D of right arm    . INCISION AND DRAINAGE PERIRECTAL ABSCESS  03/2009  . IR THORACENTESIS ASP PLEURAL SPACE W/IMG GUIDE  11/21/2018  . Neg Stress Test  2000  . PERIPHERAL VASCULAR CATHETERIZATION N/A 11/30/2015   Procedure: Carotid PTA/Stent Intervention;  Surgeon: Serafina Mitchell, MD;  Location: [facility] CV LAB;  Service: Cardiovascular;  Laterality: N/A;  . ROTATOR CUFF REPAIR Right 2009   multiple f/u Sxs/infection Tx  . VASCULAR SURGERY    . VIDEO BRONCHOSCOPY WITH ENDOBRONCHIAL NAVIGATION N/A 05/25/2017   Procedure: VIDEO BRONCHOSCOPY WITH ENDOBRONCHIAL NAVIGATION;  Surgeon: Rigoberto Noel, MD;  Location: [facility];  Service: Thoracic;  Laterality: N/A;  . VIDEO BRONCHOSCOPY WITH ENDOBRONCHIAL NAVIGATION N/A 06/26/2017   Procedure: VIDEO BRONCHOSCOPY WITH ENDOBRONCHIAL NAVIGATION WITH PLACEMENT OF FIDUCIALS;  Surgeon: Grace Isaac, MD;  Location: [facility];  Service: Thoracic;  Laterality: N/A;  . VIDEO BRONCHOSCOPY WITH ENDOBRONCHIAL ULTRASOUND N/A 05/25/2017   Procedure: VIDEO BRONCHOSCOPY WITH ENDOBRONCHIAL ULTRASOUND;  Surgeon: Rigoberto Noel, MD;  Location: [facility];  Service: Thoracic;  Laterality: N/A;         Home Medications    Prior to Admission medications   Medication Sig Start Date End Date Taking? Authorizing Provider  aspirin EC 81 MG tablet Take 1 tablet (81 mg total) by mouth daily. Patient not taking: Reported on 11/25/2018 12/21/15   Biagio Borg, MD  Blood Pressure Monitor DEVI Use to check Blood pressure daily Patient not taking: Reported on 11/25/2018 05/25/14   Biagio Borg, MD  ferrous sulfate 325 (65 FE) MG tablet Take 1 tablet (325 mg total) by mouth 3 (three) times daily with meals. Patient not taking: Reported on 11/25/2018 09/25/14   Regalado, Jerald Kief A, MD  furosemide (LASIX) 80 MG tablet Take 1 tablet (80 mg total) by mouth daily for 30 days. Patient not taking: Reported on 11/30/2018 11/27/18 12/27/18  Donne Hazel, MD  glucose blood (ACCU-CHEK AVIVA PLUS) test strip Use to check blood sugars daily Dx E11.9 Patient not taking: Reported on 11/25/2018 11/09/15   Biagio Borg, MD  levothyroxine (SYNTHROID,  LEVOTHROID) 112 MCG tablet Take 1 tablet (112 mcg total) daily by mouth. Patient not taking: Reported on 11/25/2018 06/11/17   Biagio Borg, MD  metoprolol succinate (TOPROL-XL) 25 MG 24 hr tablet Take 1 tablet (25 mg total) by mouth daily for 30 days. Patient not taking: Reported on 11/30/2018 11/28/18 12/28/18  Donne Hazel, MD  pantoprazole (PROTONIX) 40 MG tablet Take 1 tablet (40 mg total) by mouth at bedtime for 30 days. Patient not taking: Reported on 11/30/2018 11/27/18 12/27/18  Donne Hazel, MD  umeclidinium-vilanterol [facility] ELLIPTA) 62.5-25 MCG/INH AEPB Inhale 1 puff into the lungs daily. Patient not taking: Reported on 11/25/2018 05/30/17   Rigoberto Noel, MD    Family History Family History  Problem Relation Age of Onset  . Heart disease Mother        automated implantable cardioverter-defibrillator   . Diabetes type II Mother   . Alcohol abuse Father   . Lung cancer Father   . Lung cancer Maternal Grandmother   . Lung cancer Sister     Social  History Social History   Tobacco Use  . Smoking status: Current Every Day Smoker    Packs/day: 1.00    Years: 48.00    Pack years: 48.00    Types: Cigarettes  . Smokeless tobacco: Never Used  . Tobacco comment: 1-2 pks per day  Substance Use Topics  . Alcohol use: Yes    Alcohol/week: 1.0 standard drinks    Types: 1 Cans of beer per week    Comment: rarely  . Drug use: No     Allergies   Doxycycline; Lipitor [atorvastatin]; and Oxycodone   Review of Systems Review of Systems  Constitutional: Positive for fatigue. Negative for fever.  Respiratory: Positive for shortness of breath (with exertion).   All other systems reviewed and are negative.    Physical Exam Updated Vital Signs BP (!) 182/70 (BP Location: Right Arm)   Pulse 78   Temp 98.5 F (36.9 C) (Oral)   Resp (!) 24   SpO2 94%   Physical Exam Vitals signs and nursing note reviewed.  Constitutional:      General: He is not in acute distress.    Appearance: Normal appearance. He is well-developed.  HENT:     Head: Normocephalic and atraumatic.     Right Ear: Hearing normal.     Left Ear: Hearing normal.     Nose: Nose normal.  Eyes:     Conjunctiva/sclera: Conjunctivae normal.     Pupils: Pupils are equal, round, and reactive to light.  Neck:     Musculoskeletal: Normal range of motion and neck supple.  Cardiovascular:     Rate and Rhythm: Regular rhythm.     Heart sounds: S1 normal and S2 normal. No murmur. No friction rub. No gallop.   Pulmonary:     Effort: Pulmonary effort is normal. No respiratory distress.     Breath sounds: Normal breath sounds.  Chest:     Chest wall: No tenderness.  Abdominal:     General: Bowel sounds are normal.     Palpations: Abdomen is soft.     Tenderness: There is no abdominal tenderness. There is no guarding or rebound. Negative signs include Murphy's sign and McBurney's sign.     Hernia: No hernia is present.  Musculoskeletal: Normal range of motion.  Skin:     General: Skin is warm and dry.     Findings: No rash.  Neurological:  Mental Status: He is alert and oriented to person, place, and time.     GCS: GCS eye subscore is 4. GCS verbal subscore is 5. GCS motor subscore is 6.     Cranial Nerves: No cranial nerve deficit.     Sensory: No sensory deficit.     Coordination: Coordination normal.  Psychiatric:        Speech: Speech normal.        Behavior: Behavior normal.        Thought Content: Thought content normal.      ED Treatments / Results  Labs (all labs ordered are listed, but only abnormal results are displayed) Labs Reviewed  BASIC METABOLIC PANEL - Abnormal; Notable for the following components:      Result Value   Potassium 3.4 (*)    Chloride 97 (*)    Glucose, Bld 121 (*)    BUN 31 (*)    Creatinine, Ser 1.72 (*)    Calcium 8.3 (*)    GFR calc non Af Amer 42 (*)    GFR calc Af Amer 49 (*)    All other components within normal limits  CBC - Abnormal; Notable for the following components:   RBC 3.71 (*)    Hemoglobin 8.5 (*)    HCT 29.4 (*)    MCV 79.2 (*)    MCH 22.9 (*)    MCHC 28.9 (*)    RDW 17.0 (*)    All other components within normal limits  URINALYSIS, ROUTINE W REFLEX MICROSCOPIC - Abnormal; Notable for the following components:   Glucose, UA 50 (*)    Protein, ur 100 (*)    Leukocytes,Ua TRACE (*)    Bacteria, UA FEW (*)    All other components within normal limits  TROPONIN I - Abnormal; Notable for the following components:   Troponin I 0.06 (*)    All other components within normal limits  BRAIN NATRIURETIC PEPTIDE - Abnormal; Notable for the following components:   B Natriuretic Peptide 717.8 (*)    All other components within normal limits  TROPONIN I - Abnormal; Notable for the following components:   Troponin I 0.06 (*)    All other components within normal limits    EKG EKG Interpretation  Date/Time:  Friday November 29 2018 19:52:01 EDT Ventricular Rate:  79 PR Interval:  168  QRS Duration: 114 QT Interval:  444 QTC Calculation: 509 R Axis:   2 Text Interpretation:  Sinus rhythm with Premature atrial complexes RSR' or QR pattern in V1 suggests right ventricular conduction delay ST & T wave abnormality, consider inferolateral ischemia Prolonged QT Abnormal ECG Confirmed by Orpah Greek [phone]) on 11/30/2018 2:36:59 AM   Radiology Dg Chest 2 View  Result Date: 11/30/2018 CLINICAL DATA:  Shortness of breath EXAM: CHEST - 2 VIEW COMPARISON:  11/21/2018 FINDINGS: Continued near complete opacification of the left hemithorax with left pleural density and airspace disease. Right lung clear. There is cardiomegaly. No acute bony abnormality. IMPRESSION: Continued near complete opacification of the left hemithorax with diffuse left lung airspace disease and left pleural density, presumably effusion. Electronically Signed   By: Rolm Baptise M.D.   On: 11/30/2018 02:27    Procedures Procedures (including critical care time)  Medications Ordered in ED Medications  sodium chloride flush (NS) 0.9 % injection 3 mL (3 mLs Intravenous Not Given 11/30/18 0517)     Initial Impression / Assessment and Plan / ED Course  I have reviewed the triage  vital signs and the nursing notes.  Pertinent labs & imaging results that were available during my care of the patient were reviewed by me and considered in my medical decision making (see chart for details).        Patient presents to the emergency department for evaluation of generalized weakness.  Patient recently hospitalized for congestive heart failure, diuresed.  He was discharged from the hospital 2 days ago and reports that since he got home he has been very weak.  He does not have any home health and lives alone.  He has been able to ambulate with a walker but having difficulty getting around his house because of the weakness.  No unilateral or focal weakness to suggest CNS etiology.  Patient does not appear to be  exhibiting signs of decompensated congestive heart failure, however her chest x-ray shows opacification of the left hemithorax.  While he was hospitalized he had thoracentesis performed, had 1.3 L drained.  Studies did not show any evidence of infection, cultures were negative and cytology did not show evidence of malignancy.  He does have a history of lung cancer.  This appears to have completely reaccumulated.  He has been monitored here in the ER and although he has not hypoxic, he does not feel like he can manage himself at home.  Final Clinical Impressions(s) / ED Diagnoses   Final diagnoses:  Pleural effusion    ED Discharge Orders    None       Orpah Greek, MD 11/30/18 [id number]

## 2018-11-30 NOTE — H&P (Signed)
History and Physical    HASSEL UPHOFF WUJ:[id number] DOB: [birth year] DOA: 11/29/2018  PCP: Biagio Borg, MD Consultants:  [place]; Julien Nordmann - oncology; Tammi Klippel - rad onc Patient coming from:  Home - now on house arrest; NOK: Brother-in-law, Sister, [phone]  Chief Complaint: Weakness  HPI: Brad [name] is a 63 y.o. male with medical history significant of CVA; stage 3 SCC of the LUL s/p radiation and incomplete chemotherapy; chronic combined CHF (EF 45-50% with grade 2 DD in 2/17);  hypothyoiridsm; HTN; HLD; DM; chronic hep C; and COPD presenting with weakness.  Since hospital discharge, he was placed on his front porch and he wasn't able to move.  He was unable to get up and go to the bathroom.  His legs are just too weak/  Denies SOB.  No cough.  He was admitted from 4/15-22 from jail (incarcerated since 7/84/69 for uncertain reason; prior conviction on 11/2812 for taking indecent liberties with a child) with pleural effusion, negative for malignancy, thought to be due to CHF exacerbation.  He was diuresed 18L during the hospitalization.  He has h/o GI bleeding and anemia of chronic disease and his Hgb was as low as 6.6, requiring transfusion of 1 unit PRBC.  ED Course:  Recently discharged to home from jail.  Chronically elevated troponin, stable.  Not clearly in CHF.  No hypoxia.  Had thoracentesis during prior hospitalization, now opacified.  Too weak to go home, tachypnea with reaccummulation of fluid after last hospitalization.  Review of Systems: As per HPI; otherwise review of systems reviewed and negative.   Ambulatory Status:  Ambulates without assistance  Past Medical History:  Diagnosis Date  . Anemia    takes Ferrous Sulfated daily  . Arthritis   . Carotid stenosis 08/27/2014   takes Aspirin daily  . Chronic hepatitis C ([place]) 09/17/2015  . COPD (chronic obstructive pulmonary disease) (HCC)    Albuterol daily as needed and Spiriva daily  . Depression   .  Diabetes mellitus type II    takes Metformin and Glipizide daily  . Dizziness   . GERD (gastroesophageal reflux disease)   . GI bleed 12/2015  . Headache    occasionally  . History of blood clots    in right leg;being followed by Dr.Brabham for this  . History of blood transfusion 09/2014   2 units-no abnormal reaction noted  . History of gastric ulcer   . History of hiatal hernia   . Hyperlipidemia 08/27/2014   takes Simvastatin daily  . Hypertension    takes Amlodipine and Lisinopril daily  . Hypothyroidism 08/27/2014   takes Synthroid daily  . Insomnia    takes Pamelor nightly  . Joint pain    hips  . Muscle spasm    takes Zanaflex daily as needed  . MVP (mitral valve prolapse)   . Peripheral edema    takes Furosemide daily as needed  . Peripheral neuropathy   . Pleural effusion 11/2018  . Prurigo nodularis   . Stroke [facility])     Past Surgical History:  Procedure Laterality Date  . ENDARTERECTOMY Left 11/13/2014   Procedure: ENDARTERECTOMY CAROTID WITH PATCH ANGIOPLASTY;  Surgeon: Serafina Mitchell, MD;  Location: [facility] OR;  Service: Vascular;  Laterality: Left;  . ESOPHAGOGASTRODUODENOSCOPY N/A 09/24/2014   Procedure: ESOPHAGOGASTRODUODENOSCOPY (EGD);  Surgeon: Winfield Cunas., MD;  Location: [facility] ENDOSCOPY;  Service: Endoscopy;  Laterality: N/A;  . FUDUCIAL PLACEMENT N/A 06/26/2017   Procedure: PLACEMENT OF FUDUCIAL;  Surgeon: Grace Isaac, MD;  Location: [facility] OR;  Service: Thoracic;  Laterality: N/A;  . I&D of right arm    . INCISION AND DRAINAGE PERIRECTAL ABSCESS  03/2009  . IR THORACENTESIS ASP PLEURAL SPACE W/IMG GUIDE  11/21/2018  . Neg Stress Test  2000  . PERIPHERAL VASCULAR CATHETERIZATION N/A 11/30/2015   Procedure: Carotid PTA/Stent Intervention;  Surgeon: Serafina Mitchell, MD;  Location: [place] CV LAB;  Service: Cardiovascular;  Laterality: N/A;  . ROTATOR CUFF REPAIR Right 2009   multiple f/u Sxs/infection Tx  . VASCULAR SURGERY    . VIDEO BRONCHOSCOPY  WITH ENDOBRONCHIAL NAVIGATION N/A 05/25/2017   Procedure: VIDEO BRONCHOSCOPY WITH ENDOBRONCHIAL NAVIGATION;  Surgeon: Rigoberto Noel, MD;  Location: [place] OR;  Service: Thoracic;  Laterality: N/A;  . VIDEO BRONCHOSCOPY WITH ENDOBRONCHIAL NAVIGATION N/A 06/26/2017   Procedure: VIDEO BRONCHOSCOPY WITH ENDOBRONCHIAL NAVIGATION WITH PLACEMENT OF FIDUCIALS;  Surgeon: Grace Isaac, MD;  Location: [place];  Service: Thoracic;  Laterality: N/A;  . VIDEO BRONCHOSCOPY WITH ENDOBRONCHIAL ULTRASOUND N/A 05/25/2017   Procedure: VIDEO BRONCHOSCOPY WITH ENDOBRONCHIAL ULTRASOUND;  Surgeon: Rigoberto Noel, MD;  Location: [place];  Service: Thoracic;  Laterality: N/A;    Social History   Socioeconomic History  . Marital status: Single    Spouse name: Not on file  . Number of children: 0  . Years of education: Not on file  . Highest education level: Not on file  Occupational History  . Occupation: Disabled  Social Needs  . Financial resource strain: Not on file  . Food insecurity:    Worry: Not on file    Inability: Not on file  . Transportation needs:    Medical: Not on file    Non-medical: Not on file  Tobacco Use  . Smoking status: Current Every Day Smoker    Packs/day: 1.00    Years: 48.00    Pack years: 48.00    Types: Cigarettes  . Smokeless tobacco: Never Used  . Tobacco comment: 1-2 pks per day  Substance and Sexual Activity  . Alcohol use: Not Currently    Alcohol/week: 1.0 standard drinks    Types: 1 Cans of beer per week    Comment: rarely  . Drug use: No  . Sexual activity: Yes  Lifestyle  . Physical activity:    Days per week: Not on file    Minutes per session: Not on file  . Stress: Not on file  Relationships  . Social connections:    Talks on phone: Not on file    Gets together: Not on file    Attends religious service: Not on file    Active member of club or organization: Not on file    Attends meetings of clubs or organizations: Not on file    Relationship status:  Not on file  . Intimate partner violence:    Fear of current or ex partner: Not on file    Emotionally abused: Not on file    Physically abused: Not on file    Forced sexual activity: Not on file  Other Topics Concern  . Not on file  Social History Narrative   Divorced with fiance.   Disabled since June 2010 - right shoulder   No children.          Allergies  Allergen Reactions  . Doxycycline Hives and Itching  . Lipitor [Atorvastatin] Other (See Comments)    Myalgia   . Oxycodone Itching    Family History  Problem Relation Age of Onset  . Heart disease Mother        automated implantable cardioverter-defibrillator   . Diabetes type II Mother   . Alcohol abuse Father   . Lung cancer Father   . Lung cancer Maternal Grandmother   . Lung cancer Sister     Prior to Admission medications   Medication Sig Start Date End Date Taking? Authorizing Provider  aspirin EC 81 MG tablet Take 1 tablet (81 mg total) by mouth daily. Patient not taking: Reported on 11/25/2018 12/21/15   Biagio Borg, MD  Blood Pressure Monitor DEVI Use to check Blood pressure daily Patient not taking: Reported on 11/25/2018 05/25/14   Biagio Borg, MD  ferrous sulfate 325 (65 FE) MG tablet Take 1 tablet (325 mg total) by mouth 3 (three) times daily with meals. Patient not taking: Reported on 11/25/2018 09/25/14   Regalado, Jerald Kief A, MD  furosemide (LASIX) 80 MG tablet Take 1 tablet (80 mg total) by mouth daily for 30 days. Patient not taking: Reported on 11/30/2018 11/27/18 12/27/18  Donne Hazel, MD  glucose blood (ACCU-CHEK AVIVA PLUS) test strip Use to check blood sugars daily Dx E11.9 Patient not taking: Reported on 11/25/2018 11/09/15   Biagio Borg, MD  levothyroxine (SYNTHROID, LEVOTHROID) 112 MCG tablet Take 1 tablet (112 mcg total) daily by mouth. Patient not taking: Reported on 11/25/2018 06/11/17   Biagio Borg, MD  metoprolol succinate (TOPROL-XL) 25 MG 24 hr tablet Take 1 tablet (25 mg total)  by mouth daily for 30 days. Patient not taking: Reported on 11/30/2018 11/28/18 12/28/18  Donne Hazel, MD  pantoprazole (PROTONIX) 40 MG tablet Take 1 tablet (40 mg total) by mouth at bedtime for 30 days. Patient not taking: Reported on 11/30/2018 11/27/18 12/27/18  Donne Hazel, MD  umeclidinium-vilanterol [facility] ELLIPTA) 62.5-25 MCG/INH AEPB Inhale 1 puff into the lungs daily. Patient not taking: Reported on 11/25/2018 05/30/17   Rigoberto Noel, MD    Physical Exam: Vitals:   11/30/18 0634 11/30/18 0700 11/30/18 0730 11/30/18 0800  BP: (!) 182/70 (!) 187/91 (!) 184/72 (!) 186/96  Pulse: 78 81 77 76  Resp: (!) 24 (!) 22 (!) 24 (!) 29  Temp:      TempSrc:      SpO2: 94% 94% (!) 88% 93%     . General: Appears calm and comfortable and is NAD; he is physically weak and not particularly interactive . Eyes:  EOMI, normal lids, iris . ENT:  grossly normal hearing, lips & tongue, mmm . Neck:  no LAD, masses or thyromegaly . Cardiovascular:  RRR, no m/r/g. 2-3+ LE edema.  Marland Kitchen Respiratory:   Significantly decreased breath sounds throughout left lung fields with mildly increased WOB Abdomen:  soft, NT, ND, NABS . Skin:  no rash or induration seen on limited exam . Musculoskeletal:  grossly normal tone BUE/BLE, good ROM, no bony abnormality; he is deconditioned with significant exertion required to lift his legs, pull himself to a sitting position.  Compression stockings were rolled down and exacerbating edema mid-calf . Psychiatric:  Flat mood and affect, speech fluent and appropriate, AOx3 . Neurologic:  CN 2-12 grossly intact, moves all extremities in coordinated fashion    Radiological Exams on Admission: Dg Chest 2 View  Result Date: 11/30/2018 CLINICAL DATA:  Shortness of breath EXAM: CHEST - 2 VIEW COMPARISON:  11/21/2018 FINDINGS: Continued near complete opacification of the left hemithorax with left pleural density and airspace disease.  Right lung clear. There is cardiomegaly. No  acute bony abnormality. IMPRESSION: Continued near complete opacification of the left hemithorax with diffuse left lung airspace disease and left pleural density, presumably effusion. Electronically Signed   By: Rolm Baptise M.D.   On: 11/30/2018 02:27    EKG: Independently reviewed.  NSR with rate 79; nonspecific ST changes with no evidence of acute ischemia   Labs on Admission: I have personally reviewed the available labs and imaging studies at the time of the admission.  Pertinent labs:   Glucose 121 BUN 31/Creatinine 1.72/GFR 42; 32/1.46/51 on 4/22; 13/0.88/>60 on 4/15 BNP 717.8 - improved from 1440.6 on 4/15 Troponin 0.06 x 2 WBC 7.8 Hgb 8.5 UA: 50 glucose, 100 protein, trace LE, few bacteria  Assessment/Plan Principal Problem:   Weakness Active Problems:   Diabetes (HCC)   Hypothyroidism   Tobacco abuse   Essential hypertension   HLD (hyperlipidemia)   Pleural effusion on left   Stage III squamous cell carcinoma of left lung (HCC)   AKI (acute kidney injury) (HCC)   Chronic combined systolic and diastolic CHF (congestive heart failure) (HCC)   Weakness -Suspect that this is multifactorial -First, he does not appear to have received therapy services during his recent hospitalization and does not appear to be particularly motivated and so likely was quite sedentary.  Will request PT/OT consults and he may require placement. -Second, pleural effusion is recurrent (see below) -Third, he appears to have been overdiuresed during last hospitalization and has had progressive renal failure (see below) -Finally, his lung cancer has likely progressed to stage 4, as evidenced by this large and recurrent pleural effusion (see below) -Will observe for now on med surg  Acute Kidney Injury -Patient was aggressively diuresed during his last hospitalization for CHF -Admitting creatinine on 4/15 was 0.88, and his creatinine has progressively increased and is now 1.72 -Will gently add  back IVF at 50 cc/hour x 20 hours, for a total of 1L -Will hold Lasix -Will place patient on a low-sodium diet following thoracentesis  Large left pleural effusion -He had thoracentesis during his last admission with drainage of 1.3L -This appears to have reaccummulated -I discussed the patient with Dr. Roxan Hockey, who recommends repeat IR thoracentesis -He notes that this is likely a malignant effusion and thus indicative of progression to stage IV lung CA -Will request cell count, culture, repeat cytology (negative for malignancy last time), and LDH - but all other tests are presumed to be unchanged from a week ago and so will not be repeated  Stage 3 (4?) lung CA -Dr. Maylon Peppers recommended CT C/A/P during last hospitalization but this was held in the setting of diuresis for CHF exacerbation -Will order now, as it may influence disposition planning -He completed radiation therapy but has not completed chemotherapy regimen of Taxol and carboplatin -He has missed several follow-up appointments -Significant progression of his malignancy may indicate a need to reconsider treatment plan and disposition  Chronic combined CHF -4/16 echo with presenting EF but severe LVH and biatrial enlargement concerning for infiltrative disease -However, further evaluation makes this unlikely -Instead, he is thought to have hypertensive cardiomyopathy -He appears to have been overdiuresed during his last hospitalization and so will hold Lasix at this time and give gentle IVF hydration -Resume ASA -He has hypoalbuminemia contributing to LE edema and his compression stockings were obstructing blood flow mid-calf; will change to thigh-high ambulatory stockings and encourage ambulation (with PT evaluation)  Persistent tobacco dependence with COPD -Despite  malignancy, he has previously been fairly adamant in his desire to continue smoking -Patch provided at his request -Continue Anoro  DM -A1c was 6.2 on 4/15  without medications -Will not check blood sugars or provide treatment for this issue at this time  HTN -Despite hypertensive cardiomyopathy and recent hospitalization, he has not been taking his medications since discharge -Will resume Toprol XL  HLD -4/16 lipid panel: 90/28/53/45 -His highest reported LDL in Epic was 92 in 11/16 -He is not on medication and this does not appear to be indicated at this time  Hypothyroidism -TSH on 4/15 was 13.2 -However, the patient has not been taking Synthroid -It was resumed at prior dose -Will need repeat thyroid testing in 4-6 weeks  Noncompliance -Patient has h/o medication non-compliance -He appears to lack motivation towards becoming not sedentary -He continues to smoke -These issues are likely to continue to negatively impact his health status and result in recurrent admissions   DVT prophylaxis:  Lovenox post-thoracentesis; SCDs until procedure Code Status: Full - confirmed with patient Family Communication: None present Disposition Plan:  Home once clinically improved Consults called: CT surgery, IR; PT/OT  Admission status: It is my clinical opinion that referral for OBSERVATION is reasonable and necessary in this patient based on the above information provided. The aforementioned taken together are felt to place the patient at high risk for further clinical deterioration. However it is anticipated that the patient may be medically stable for discharge from the hospital within 24 to 48 hours.    Karmen Bongo MD [facility]   How to contact the [facility] Attending or Consulting provider [place] or covering provider during after hours [place], for this patient?  1. Check the care team in [facility] and look for a) attending/consulting [facility] provider listed and b) the [facility] team listed 2. Log into [email or web address] and use [place]'s universal password to access. If you do not have the password, please contact the hospital operator. 3. Locate  the [facility] provider you are looking for under [facility] and page to a number that you can be directly reached. 4. If you still have difficulty reaching the provider, please page the [facility] (Director on Call) for the Hospitalists listed on amion for assistance.   11/30/2018, 8:33 AM

## 2018-12-01 DIAGNOSIS — R64 Cachexia: Secondary | ICD-10-CM | POA: Diagnosis present

## 2018-12-01 DIAGNOSIS — C3492 Malignant neoplasm of unspecified part of left bronchus or lung: Secondary | ICD-10-CM | POA: Diagnosis not present

## 2018-12-01 DIAGNOSIS — I110 Hypertensive heart disease with heart failure: Secondary | ICD-10-CM | POA: Diagnosis present

## 2018-12-01 DIAGNOSIS — Z833 Family history of diabetes mellitus: Secondary | ICD-10-CM | POA: Diagnosis not present

## 2018-12-01 DIAGNOSIS — J189 Pneumonia, unspecified organism: Secondary | ICD-10-CM | POA: Diagnosis not present

## 2018-12-01 DIAGNOSIS — B182 Chronic viral hepatitis C: Secondary | ICD-10-CM | POA: Diagnosis present

## 2018-12-01 DIAGNOSIS — Z923 Personal history of irradiation: Secondary | ICD-10-CM | POA: Diagnosis not present

## 2018-12-01 DIAGNOSIS — E1159 Type 2 diabetes mellitus with other circulatory complications: Secondary | ICD-10-CM | POA: Diagnosis not present

## 2018-12-01 DIAGNOSIS — J852 Abscess of lung without pneumonia: Secondary | ICD-10-CM | POA: Diagnosis present

## 2018-12-01 DIAGNOSIS — I10 Essential (primary) hypertension: Secondary | ICD-10-CM | POA: Diagnosis not present

## 2018-12-01 DIAGNOSIS — J440 Chronic obstructive pulmonary disease with acute lower respiratory infection: Secondary | ICD-10-CM | POA: Diagnosis present

## 2018-12-01 DIAGNOSIS — D630 Anemia in neoplastic disease: Secondary | ICD-10-CM | POA: Diagnosis present

## 2018-12-01 DIAGNOSIS — I341 Nonrheumatic mitral (valve) prolapse: Secondary | ICD-10-CM | POA: Diagnosis present

## 2018-12-01 DIAGNOSIS — J90 Pleural effusion, not elsewhere classified: Secondary | ICD-10-CM | POA: Diagnosis not present

## 2018-12-01 DIAGNOSIS — N179 Acute kidney failure, unspecified: Secondary | ICD-10-CM | POA: Diagnosis not present

## 2018-12-01 DIAGNOSIS — Z8673 Personal history of transient ischemic attack (TIA), and cerebral infarction without residual deficits: Secondary | ICD-10-CM | POA: Diagnosis not present

## 2018-12-01 DIAGNOSIS — Z801 Family history of malignant neoplasm of trachea, bronchus and lung: Secondary | ICD-10-CM | POA: Diagnosis not present

## 2018-12-01 DIAGNOSIS — J918 Pleural effusion in other conditions classified elsewhere: Secondary | ICD-10-CM | POA: Diagnosis not present

## 2018-12-01 DIAGNOSIS — C3490 Malignant neoplasm of unspecified part of unspecified bronchus or lung: Secondary | ICD-10-CM | POA: Diagnosis not present

## 2018-12-01 DIAGNOSIS — R7303 Prediabetes: Secondary | ICD-10-CM | POA: Diagnosis present

## 2018-12-01 DIAGNOSIS — E039 Hypothyroidism, unspecified: Secondary | ICD-10-CM | POA: Diagnosis not present

## 2018-12-01 DIAGNOSIS — Z7189 Other specified counseling: Secondary | ICD-10-CM | POA: Diagnosis not present

## 2018-12-01 DIAGNOSIS — Z8249 Family history of ischemic heart disease and other diseases of the circulatory system: Secondary | ICD-10-CM | POA: Diagnosis not present

## 2018-12-01 DIAGNOSIS — Z8711 Personal history of peptic ulcer disease: Secondary | ICD-10-CM | POA: Diagnosis not present

## 2018-12-01 DIAGNOSIS — Z720 Tobacco use: Secondary | ICD-10-CM | POA: Diagnosis not present

## 2018-12-01 DIAGNOSIS — F17210 Nicotine dependence, cigarettes, uncomplicated: Secondary | ICD-10-CM | POA: Diagnosis present

## 2018-12-01 DIAGNOSIS — C3412 Malignant neoplasm of upper lobe, left bronchus or lung: Secondary | ICD-10-CM | POA: Diagnosis present

## 2018-12-01 DIAGNOSIS — R531 Weakness: Secondary | ICD-10-CM | POA: Diagnosis not present

## 2018-12-01 DIAGNOSIS — I5042 Chronic combined systolic (congestive) and diastolic (congestive) heart failure: Secondary | ICD-10-CM | POA: Diagnosis not present

## 2018-12-01 DIAGNOSIS — Z86718 Personal history of other venous thrombosis and embolism: Secondary | ICD-10-CM | POA: Diagnosis not present

## 2018-12-01 DIAGNOSIS — E785 Hyperlipidemia, unspecified: Secondary | ICD-10-CM | POA: Diagnosis not present

## 2018-12-01 DIAGNOSIS — C799 Secondary malignant neoplasm of unspecified site: Secondary | ICD-10-CM | POA: Diagnosis present

## 2018-12-01 DIAGNOSIS — Z66 Do not resuscitate: Secondary | ICD-10-CM | POA: Diagnosis not present

## 2018-12-01 DIAGNOSIS — Z515 Encounter for palliative care: Secondary | ICD-10-CM | POA: Diagnosis not present

## 2018-12-01 LAB — BASIC METABOLIC PANEL
Anion gap: 8 (ref 5–15)
BUN: 20 mg/dL (ref 8–23)
CO2: 27 mmol/L (ref 22–32)
Calcium: 8.2 mg/dL — ABNORMAL LOW (ref 8.9–10.3)
Chloride: 103 mmol/L (ref 98–111)
Creatinine, Ser: 1.17 mg/dL (ref 0.61–1.24)
GFR calc Af Amer: >60 mL/min (ref >60)
GFR calc non Af Amer: >60 mL/min (ref >60)
Glucose, Bld: 104 mg/dL — ABNORMAL HIGH (ref 70–99)
Potassium: 3.8 mmol/L (ref 3.5–5.1)
Sodium: 138 mmol/L (ref 135–145)

## 2018-12-01 LAB — GRAM STAIN

## 2018-12-01 LAB — CBC
HCT: 23.9 % — ABNORMAL LOW (ref 39.0–52.0)
Hemoglobin: 7 g/dL — ABNORMAL LOW (ref 13.0–17.0)
MCH: 23 pg — ABNORMAL LOW (ref 26.0–34.0)
MCHC: 29.3 g/dL — ABNORMAL LOW (ref 30.0–36.0)
MCV: 78.6 fL — ABNORMAL LOW (ref 80.0–100.0)
Platelets: 281 10*3/uL (ref 150–400)
RBC: 3.04 MIL/uL — ABNORMAL LOW (ref 4.22–5.81)
RDW: 16.9 % — ABNORMAL HIGH (ref 11.5–15.5)
WBC: 6.7 10*3/uL (ref 4.0–10.5)
nRBC: 0 % (ref 0.0–0.2)

## 2018-12-01 LAB — GLUCOSE, CAPILLARY
Glucose-Capillary: 138 mg/dL — ABNORMAL HIGH (ref 70–99)
Glucose-Capillary: 195 mg/dL — ABNORMAL HIGH (ref 70–99)
Glucose-Capillary: 161 mg/dL — ABNORMAL HIGH (ref 70–99)
Glucose-Capillary: 151 mg/dL — ABNORMAL HIGH (ref 70–99)

## 2018-12-01 NOTE — Progress Notes (Signed)
PROGRESS NOTE    Brad [name]  KPT:[id number] DOB: 03-14-1956 DOA: 11/29/2018 PCP: Biagio Borg, MD   Brief Narrative: 63 y.o. male with medical history significant of CVA; stage 3 SCC of the LUL s/p radiation and incomplete chemotherapy; chronic combined CHF (EF 45-50% with grade 2 DD in 2/17);  hypothyoiridsm; HTN; HLD; DM; chronic hep C; and COPD presenting with weakness.  Since hospital discharge, he was placed on his front porch and he wasn't able to move.  He was unable to get up and go to the bathroom.  His legs are just too weak/  Denies SOB.  No cough.  He was admitted from 4/15-22 from jail (incarcerated since 1/70/01 for uncertain reason; prior conviction on 11/2812 for taking indecent liberties with a child) with pleural effusion, negative for malignancy, thought to be due to CHF exacerbation.  He was diuresed 18L during the hospitalization.  He has h/o GI bleeding and anemia of chronic disease and his Hgb was as low as 6.6, requiring transfusion of 1 unit PRBC.  ED Course:  Recently discharged to home from jail.  Chronically elevated troponin, stable.  Not clearly in CHF.  No hypoxia.  Had thoracentesis during prior hospitalization, now opacified.  Too weak to go home, tachypnea with reaccummulation of fluid after last hospitalization 4/25 s/p left thoracentesis with 1.9 L fluid and fluid removed, postprocedure chest x-ray no pneumothorax.  Pleural fluid WBC 31 ( was 1424 1 yr back), w neutrophil 15%, cytology sent, gram stain and culture NGTD. LDH 115.  Subjective: Resting comfortably.  Complains of generalized weakness, mild pain in the lower back, no nausea vomiting chest pain.  Assessment & Plan:   Widely metastatic left lung cancer, CT scan 4/25 shows cancer worsening,"Large centrally necrotic mass in the left upper lobe, likely toreflect treated neoplasm. Given the appearance of this lesion on today's examination, this is favored to reflect a large pulmonary abscess at  this time. This is associated with a moderate loculated left pleural effusion, largest component of which is subpulmonic in position and also with new 2.8 x 1.8 cm  RLL pulm nodule, multiple right adrenal nodules and masses, and new lytic lesions in the left side of the manubrium and the right S1 vertebra" patient had completed radiation therapy but appears to have not finished his chemotherapy regimen of Taxol and carboplatin, previously followed by Dr. Julien Nordmann but has missed multiple appointments. C/w unasyn, will ask Oncology eval re further plan.  Discussed the case with Dr. Ammie Dalton of oncology and he will have Dr. Julien Nordmann see the patient tomorrow.  Large loculated left pleural effusion status post thoracentesis 1.9 L fluid removal, suspect malignant effusion. Gram stain culture no growth, fluid appears exudative  Generalized Weakness: Multifactorial in the setting of metastatic cancer, anemia.  PT OT evaluation  Prediabetes: Hha1c 6.2 stable. Controlled.  Monitor CBG  Hypothyroidism on levothyroxine  Tobacco abuse: Cessation advised  Essential hypertension: Stable on metoprolol  AKI resolved with gentle IV fluid hydration.    Chronic combined systolic and diastolic CHF: TTE lvef 74-94%, severe concentric LVH, severe LAE moderate RA and concern for infiltrative process on recent echo. compensated.  Patient overdiuresied on recent admission, with gentle fluid AKI resolved, stop fluids.  Holding diuretics for now  COPD,.stable continue bronchodilators.  Anemia of chronic disease/malignancy with hemoglobin 7.0 g.  Continue iron supplementation, transfuse for hemoglobin less than 7  History of CVA/PVD/Carotid endarterectomy, on aspirin  History of duodenal ulcer- cont ppi History of chronic  hep C. Recently discharged from jail. Noncompliance/poor follow-up  Patient will need to stay in the hospital at least 2 midnights given his complex comorbidities, worsening cancer generalized  weakness, pleural effusion.  DVT prophylaxis: SQ lovenox Code Status: FULL Family Communication: Discussed lab, CT scan finding in detail, aware about metastatic lung cancer Disposition Plan: remains inpatient pending clinical improvement.   Consultants:  IR Oncology  Procedures:  4/25; left Thoracentesis with 1.9 L fluid removal  4/25 CTA chest/ABD pelvis 1. Large centrally necrotic mass in the left upper lobe, likely to reflect treated neoplasm. Given the appearance of this lesion on today's examination, this is favored to reflect a large pulmonary abscess at this time. This is associated with a moderate loculated left pleural effusion, largest component of which is subpulmonic in position. 2. New 2.8 x 1.8 cm right lower lobe pulmonary nodule, multiple right adrenal nodules and masses, and new lytic lesions in the left side of the manubrium and the right S1 vertebra, compatible with widespread metastatic disease. 3. Colonic diverticulosis without evidence of acute diverticulitis at this time. 4. Aortic atherosclerosis, in addition to left main and 3 vessel coronary artery disease. Please note that although the presence of coronary artery calcium documents the presence of coronary artery disease, the severity of this disease and any potential stenosis cannot be assessed on this non-gated CT examination. Assessment for potential risk factor modification, dietary therapy or pharmacologic therapy may be warranted, if clinically indicated. 5. Additional incidental findings, as above.  Antimicrobials/microbiology:  4/25 Gram stain and left pleural fluid culture: no growth so far  UNASYN 4/25 >>  Anti-infectives (From admission, onward)   Start     Dose/Rate Route Frequency Ordered Stop   11/30/18 1330  Ampicillin-Sulbactam (UNASYN) 3 g in sodium chloride 0.9 % 100 mL IVPB     3 g 200 mL/hr over 30 Minutes Intravenous Every 6 hours 11/30/18 1326          Objective: Vitals:   11/30/18 1623 11/30/18 2013 12/01/18 0524 12/01/18 0745  BP: (!) 187/70 (!) 165/61 (!) 165/73   Pulse: (!) 59 75 66   Resp:  (!) 23 20   Temp: 97.6 F (36.4 C) 97.9 F (36.6 C) 97.8 F (36.6 C)   TempSrc: Oral Oral Oral   SpO2: 100% 93% 96% 93%  Weight:   86.2 kg   Height:        Intake/Output Summary (Last 24 hours) at 12/01/2018 0938 Last data filed at 12/01/2018 0844 Gross per 24 hour  Intake 957.36 ml  Output 1330 ml  Net -372.64 ml   Filed Weights   11/30/18 0917 12/01/18 0524  Weight: 87.4 kg 86.2 kg   Weight change:   Body mass index is 26.5 kg/m.  Intake/Output from previous day: 04/25 0701 - 04/26 0700 In: 957.4 [P.O.:720; I.V.:37.4; IV Piggyback:200] Out: 1280 [Urine:1280] Intake/Output this shift: Total I/O In: -  Out: 350 [Urine:350]  Examination:  General exam: Appears calm and comfortable,Not in distress, older fore the age HEENT:PERRL,Oral mucosa moist, Ear/Nose normal on gross exam Respiratory system: Bilateral equal air entry, normal vesicular breath sounds, no wheezes or crackles  Cardiovascular system: S1 & S2 heard,No JVD, murmurs. Gastrointestinal system: Abdomen is  soft, non tender, non distended, BS +  Nervous System:Alert and oriented. No focal neurological deficits/moving extremities, sensation intact. Extremities: No edema, no clubbing, distal peripheral pulses palpable. Skin: No rashes, lesions, no icterus MSK: Normal muscle bulk,tone ,power  Medications:  Scheduled Meds:  aspirin EC  81 mg Oral Daily   docusate sodium  100 mg Oral BID   enoxaparin (LOVENOX) injection  40 mg Subcutaneous Q24H   ferrous sulfate  325 mg Oral TID WC   levothyroxine  112 mcg Oral QAC breakfast   metoprolol succinate  25 mg Oral Daily   nicotine  14 mg Transdermal Daily   pantoprazole  40 mg Oral QHS   umeclidinium-vilanterol  1 puff Inhalation Daily   Continuous Infusions:  ampicillin-sulbactam (UNASYN) IV 3  g (12/01/18 0837)    Data Reviewed: I have personally reviewed following labs and imaging studies  CBC: Recent Labs  Lab 11/25/18 0705 11/26/18 0414 11/27/18 0354 11/29/18 1955 12/01/18 0306  WBC  --   --   --  7.8 6.7  HGB 7.5* 7.5* 8.1* 8.5* 7.0*  HCT 24.8* 25.0* 27.0* 29.4* 23.9*  MCV  --   --   --  79.2* 78.6*  PLT  --   --   --  333 295   Basic Metabolic Panel: Recent Labs  Lab 11/25/18 0705 11/26/18 0414 11/27/18 0354 11/29/18 1955 12/01/18 0306  NA 136 135 134* 136 138  K 3.7 3.7 3.8 3.4* 3.8  CL 93* 93* 96* 97* 103  CO2 32 31 28 30 27   GLUCOSE 120* 126* 162* 121* 104*  BUN 29* 32* 32* 31* 20  CREATININE 1.33* 1.58* 1.46* 1.72* 1.17  CALCIUM 8.4* 8.3* 8.2* 8.3* 8.2*  MG 2.0 2.0  --   --   --    GFR: Estimated Creatinine Clearance: 70.6 mL/min (by C-G formula based on SCr of 1.17 mg/dL). Liver Function Tests: No results for input(s): AST, ALT, ALKPHOS, BILITOT, PROT, ALBUMIN in the last 168 hours. No results for input(s): LIPASE, AMYLASE in the last 168 hours. No results for input(s): AMMONIA in the last 168 hours. Coagulation Profile: No results for input(s): INR, PROTIME in the last 168 hours. Cardiac Enzymes: Recent Labs  Lab 11/30/18 0018 11/30/18 0359  TROPONINI 0.06* 0.06*   BNP (last 3 results) No results for input(s): PROBNP in the last 8760 hours. HbA1C: No results for input(s): HGBA1C in the last 72 hours. CBG: Recent Labs  Lab 11/27/18 0803 11/30/18 1212 11/30/18 1619 11/30/18 2102 12/01/18 0732  GLUCAP 130* 98 187* 126* 138*   Lipid Profile: No results for input(s): CHOL, HDL, LDLCALC, TRIG, CHOLHDL, LDLDIRECT in the last 72 hours. Thyroid Function Tests: No results for input(s): TSH, T4TOTAL, FREET4, T3FREE, THYROIDAB in the last 72 hours. Anemia Panel: No results for input(s): VITAMINB12, FOLATE, FERRITIN, TIBC, IRON, RETICCTPCT in the last 72 hours. Sepsis Labs: No results for input(s): PROCALCITON, LATICACIDVEN in the last  168 hours.  Recent Results (from the past 240 hour(s))  Gram stain     Status: None   Collection Time: 11/21/18 12:03 PM  Result Value Ref Range Status   Specimen Description FLUID PLEURAL LEFT  Final   Special Requests NONE  Final   Gram Stain   Final    CYTOSPIN SMEAR WBC PRESENT,BOTH PMN AND MONONUCLEAR NO ORGANISMS SEEN Performed at [facility], [address] [place], [place] [address]    Report Status 11/21/2018 FINAL  Final  Acid Fast Smear (AFB)     Status: None   Collection Time: 11/21/18 12:03 PM  Result Value Ref Range Status   AFB Specimen Processing Concentration  Final   Acid Fast Smear Negative  Final    Comment: (NOTE) Performed At: [facility],  [place] [id number] Rush Farmer MD [id number]    Source (AFB) FLUID  Final    Comment: PLEURAL LEFT Performed at [facility], [place] [address] [place], [place] [address]   Culture, body fluid-bottle     Status: None   Collection Time: 11/21/18 12:03 PM  Result Value Ref Range Status   Specimen Description FLUID PLEURAL LEFT  Final   Special Requests NONE  Final   Culture   Final    NO GROWTH 5 DAYS Performed at [facility] [address], [place], [place] [address]    Report Status 11/26/2018 FINAL  Final  Culture, body fluid-bottle     Status: None (Preliminary result)   Collection Time: 11/30/18 10:56 AM  Result Value Ref Range Status   Specimen Description PLEURAL LEFT  Final   Special Requests NONE  Final   Culture   Final    NO GROWTH < 24 HOURS Performed at [facility], [place] [address], [place], [place] [address]    Report Status PENDING  Incomplete      Radiology Studies: Dg Chest 1 View  Result Date: 11/30/2018 CLINICAL DATA:  Status post left thoracentesis. EXAM: CHEST  1 VIEW COMPARISON:  November 30, 2018 FINDINGS: A significant left-sided pleural effusion remains. No pneumothorax after thoracentesis. The right lung is clear.  IMPRESSION: Significant effusion and underlying opacity remains on the left. However, no pneumothorax is seen after left-sided thoracentesis. Electronically Signed   By: Dorise Bullion III M.D   On: 11/30/2018 11:28   Dg Chest 2 View  Result Date: 11/30/2018 CLINICAL DATA:  Shortness of breath EXAM: CHEST - 2 VIEW COMPARISON:  11/21/2018 FINDINGS: Continued near complete opacification of the left hemithorax with left pleural density and airspace disease. Right lung clear. There is cardiomegaly. No acute bony abnormality. IMPRESSION: Continued near complete opacification of the left hemithorax with diffuse left lung airspace disease and left pleural density, presumably effusion. Electronically Signed   By: Rolm Baptise M.D.   On: 11/30/2018 02:27   Ct Chest W Contrast  Result Date: 11/30/2018 CLINICAL DATA:  63 year old male with history of non-small cell lung cancer. Staging examination. EXAM: CT CHEST, ABDOMEN, AND PELVIS WITH CONTRAST TECHNIQUE: Multidetector CT imaging of the chest, abdomen and pelvis was performed following the standard protocol during bolus administration of intravenous contrast. CONTRAST:  62mL OMNIPAQUE IOHEXOL 300 MG/ML  SOLN COMPARISON:  PET-CT 04/25/2017. FINDINGS: CT CHEST FINDINGS Cardiovascular: Heart size is mildly enlarged. There is no significant pericardial fluid, thickening or pericardial calcification. There is aortic atherosclerosis, as well as atherosclerosis of the great vessels of the mediastinum and the coronary arteries, including calcified atherosclerotic plaque in the left main, left anterior descending, left circumflex and right coronary arteries. Mediastinum/Nodes: No pathologically enlarged mediastinal or hilar lymph nodes. Esophagus is unremarkable in appearance. No axillary lymphadenopathy. Lungs/Pleura: Previously noted left upper lobe nodule is no longer clearly identifiable. Several fiducial markers are noted in the left upper lobe. On today's examination  there is a new 7.8 x 9.5 x 8.3 cm mass-like area (axial image 21 of series 4 and coronal image 105 of series 6) which is centrally hypovascular with multiple internal locules of gas, favored to represent a large centrally necrotic mass, likely with superinfection (i.e., a large pulmonary abscess) with some adjacent postobstructive changes in the remaining portions of the left upper lobe. This is associated with outward bowing of the left major fissure. Some consolidative changes in volume loss are noted  in the anterior and lateral aspect of the left lower lobe. Moderate left pleural effusion predominantly loculated in a subpulmonic position. New right lower lobe pulmonary nodule (axial image 131 of series 5) measuring 2.8 x 1.8 cm, concerning for metastatic lesion. No right pleural effusion. Musculoskeletal: Lytic lesion in the left side of the manubrium measuring 2.5 x 1.7 cm (axial image 13 of series 4). CT ABDOMEN PELVIS FINDINGS Hepatobiliary: No suspicious cystic or solid hepatic lesions. No intra or extrahepatic biliary ductal dilatation. Gallbladder is unremarkable in appearance. Pancreas: No pancreatic mass. No pancreatic ductal dilatation. No pancreatic or peripancreatic fluid or inflammatory changes. Spleen: Unremarkable. Adrenals/Urinary Tract: Multiple new right adrenal nodules and masses, compatible with metastatic disease, largest of which measures 2.8 x 3.8 cm (axial image 71 of series 4). Left adrenal gland is normal in appearance. Left kidney is normal in appearance. Multiple low-attenuation lesions in the right kidney, compatible with simple cysts measuring up to 4.2 cm in the interpolar region. In the lower pole of the right kidney there is also an exophytic intermediate attenuation lesion which is incompletely characterized, but similar to prior PET-CT 04/25/2017, favored to represent a mildly proteinaceous/hemorrhagic cyst. No hydroureteronephrosis. Urinary bladder is normal in appearance.  Stomach/Bowel: Normal appearance of the stomach. No pathologic dilatation of small bowel or colon. Numerous colonic diverticulae are noted, particularly in the proximal sigmoid colon, without surrounding inflammatory changes to suggest an acute diverticulitis at this time. Normal appendix. Vascular/Lymphatic: Aortic atherosclerosis, without evidence of aneurysm or dissection in the abdominal or pelvic vasculature. No lymphadenopathy noted in the abdomen or pelvis. Reproductive: Prostate gland and seminal vesicles are unremarkable in appearance. Other: No significant volume of ascites.  No pneumoperitoneum. Musculoskeletal: Large lytic lesion in the right side of the S1 vertebra measuring 3.2 x 4.2 cm (axial image 97 of series 4). IMPRESSION: 1. Large centrally necrotic mass in the left upper lobe, likely to reflect treated neoplasm. Given the appearance of this lesion on today's examination, this is favored to reflect a large pulmonary abscess at this time. This is associated with a moderate loculated left pleural effusion, largest component of which is subpulmonic in position. 2. New 2.8 x 1.8 cm right lower lobe pulmonary nodule, multiple right adrenal nodules and masses, and new lytic lesions in the left side of the manubrium and the right S1 vertebra, compatible with widespread metastatic disease. 3. Colonic diverticulosis without evidence of acute diverticulitis at this time. 4. Aortic atherosclerosis, in addition to left main and 3 vessel coronary artery disease. Please note that although the presence of coronary artery calcium documents the presence of coronary artery disease, the severity of this disease and any potential stenosis cannot be assessed on this non-gated CT examination. Assessment for potential risk factor modification, dietary therapy or pharmacologic therapy may be warranted, if clinically indicated. 5. Additional incidental findings, as above. Electronically Signed   By: Vinnie Langton M.D.    On: 11/30/2018 12:02   Ct Abdomen Pelvis W Contrast  Result Date: 11/30/2018 CLINICAL DATA:  63 year old male with history of non-small cell lung cancer. Staging examination. EXAM: CT CHEST, ABDOMEN, AND PELVIS WITH CONTRAST TECHNIQUE: Multidetector CT imaging of the chest, abdomen and pelvis was performed following the standard protocol during bolus administration of intravenous contrast. CONTRAST:  32mL OMNIPAQUE IOHEXOL 300 MG/ML  SOLN COMPARISON:  PET-CT 04/25/2017. FINDINGS: CT CHEST FINDINGS Cardiovascular: Heart size is mildly enlarged. There is no significant pericardial fluid, thickening or pericardial calcification. There is aortic atherosclerosis, as well as  atherosclerosis of the great vessels of the mediastinum and the coronary arteries, including calcified atherosclerotic plaque in the left main, left anterior descending, left circumflex and right coronary arteries. Mediastinum/Nodes: No pathologically enlarged mediastinal or hilar lymph nodes. Esophagus is unremarkable in appearance. No axillary lymphadenopathy. Lungs/Pleura: Previously noted left upper lobe nodule is no longer clearly identifiable. Several fiducial markers are noted in the left upper lobe. On today's examination there is a new 7.8 x 9.5 x 8.3 cm mass-like area (axial image 21 of series 4 and coronal image 105 of series 6) which is centrally hypovascular with multiple internal locules of gas, favored to represent a large centrally necrotic mass, likely with superinfection (i.e., a large pulmonary abscess) with some adjacent postobstructive changes in the remaining portions of the left upper lobe. This is associated with outward bowing of the left major fissure. Some consolidative changes in volume loss are noted in the anterior and lateral aspect of the left lower lobe. Moderate left pleural effusion predominantly loculated in a subpulmonic position. New right lower lobe pulmonary nodule (axial image 131 of series 5) measuring  2.8 x 1.8 cm, concerning for metastatic lesion. No right pleural effusion. Musculoskeletal: Lytic lesion in the left side of the manubrium measuring 2.5 x 1.7 cm (axial image 13 of series 4). CT ABDOMEN PELVIS FINDINGS Hepatobiliary: No suspicious cystic or solid hepatic lesions. No intra or extrahepatic biliary ductal dilatation. Gallbladder is unremarkable in appearance. Pancreas: No pancreatic mass. No pancreatic ductal dilatation. No pancreatic or peripancreatic fluid or inflammatory changes. Spleen: Unremarkable. Adrenals/Urinary Tract: Multiple new right adrenal nodules and masses, compatible with metastatic disease, largest of which measures 2.8 x 3.8 cm (axial image 71 of series 4). Left adrenal gland is normal in appearance. Left kidney is normal in appearance. Multiple low-attenuation lesions in the right kidney, compatible with simple cysts measuring up to 4.2 cm in the interpolar region. In the lower pole of the right kidney there is also an exophytic intermediate attenuation lesion which is incompletely characterized, but similar to prior PET-CT 04/25/2017, favored to represent a mildly proteinaceous/hemorrhagic cyst. No hydroureteronephrosis. Urinary bladder is normal in appearance. Stomach/Bowel: Normal appearance of the stomach. No pathologic dilatation of small bowel or colon. Numerous colonic diverticulae are noted, particularly in the proximal sigmoid colon, without surrounding inflammatory changes to suggest an acute diverticulitis at this time. Normal appendix. Vascular/Lymphatic: Aortic atherosclerosis, without evidence of aneurysm or dissection in the abdominal or pelvic vasculature. No lymphadenopathy noted in the abdomen or pelvis. Reproductive: Prostate gland and seminal vesicles are unremarkable in appearance. Other: No significant volume of ascites.  No pneumoperitoneum. Musculoskeletal: Large lytic lesion in the right side of the S1 vertebra measuring 3.2 x 4.2 cm (axial image 97 of  series 4). IMPRESSION: 1. Large centrally necrotic mass in the left upper lobe, likely to reflect treated neoplasm. Given the appearance of this lesion on today's examination, this is favored to reflect a large pulmonary abscess at this time. This is associated with a moderate loculated left pleural effusion, largest component of which is subpulmonic in position. 2. New 2.8 x 1.8 cm right lower lobe pulmonary nodule, multiple right adrenal nodules and masses, and new lytic lesions in the left side of the manubrium and the right S1 vertebra, compatible with widespread metastatic disease. 3. Colonic diverticulosis without evidence of acute diverticulitis at this time. 4. Aortic atherosclerosis, in addition to left main and 3 vessel coronary artery disease. Please note that although the presence of coronary artery calcium documents  the presence of coronary artery disease, the severity of this disease and any potential stenosis cannot be assessed on this non-gated CT examination. Assessment for potential risk factor modification, dietary therapy or pharmacologic therapy may be warranted, if clinically indicated. 5. Additional incidental findings, as above. Electronically Signed   By: Vinnie Langton M.D.   On: 11/30/2018 12:02   US Thoracentesis Asp Pleural Space W/img Guide  Result Date: 11/30/2018 INDICATION: Patient with history of stage 3 SCC of the left upper lobe s/p radiation and incomplete chemotherapy, COPD, CHF who presented [facility] ED this morning with complaints of weakness and dyspnea. Noted to have recurrent left pleural effusion - last thoracentesis 11/21/18 yielding 1.3 L pleural fluid. Request has been made to IR for diagnostic and therapeutic thoracentesis. EXAM: ULTRASOUND GUIDED LEFT THORACENTESIS MEDICATIONS: 8 mL 1% lidocaine. COMPLICATIONS: None immediate. PROCEDURE: An ultrasound guided thoracentesis was thoroughly discussed with the patient and questions answered. The benefits, risks,  alternatives and complications were also discussed. The patient understands and wishes to proceed with the procedure. Written consent was obtained. Ultrasound was performed to localize and mark an adequate pocket of fluid in the left chest. The area was then prepped and draped in the normal sterile fashion. 1% Lidocaine was used for local anesthesia. Under ultrasound guidance a 6 Fr Safe-T-Centesis catheter was introduced. Thoracentesis was performed. The catheter was removed and a dressing applied. FINDINGS: A total of approximately 1.9 L of golden yellow fluid was removed. Procedure was aborted due to patient complaints of chest pain - some residual fluid remains on post procedure ultrasound. samples were sent to the laboratory as requested by the clinical team. IMPRESSION: Successful ultrasound guided left thoracentesis yielding 1.9 L of pleural fluid. Read by Candiss Norse, PA-C Electronically Signed   By: Aletta Edouard M.D.   On: 11/30/2018 10:51      LOS: 0 days   Time spent: More than 50% of that time was spent in counseling and/or coordination of care.  Antonieta Pert, MD [facility]  12/01/2018, 9:38 AM

## 2018-12-01 NOTE — Care Management Obs Status (Signed)
MEDICARE OBSERVATION STATUS NOTIFICATION   Patient Details  Name: Brad [name] MRN: [id number] Date of Birth: October 17, 1955   Medicare Observation Status Notification Given:  Yes    Claudie Leach, RN 12/01/2018, 9:17 AM

## 2018-12-01 NOTE — TOC Initial Note (Addendum)
Transition of Care [facility]) - Initial/Assessment Note    Patient Details  Name: Brad [name] MRN: [id number] Date of Birth: 1955-09-07  Transition of [facility]) CM/SW Contact:    Claudie Leach, RN Phone Number: 12/01/2018, 9:56 AM  Clinical Narrative:      Pt admitted as inpatient last week.  Pt [place] jail and then to home. Pt lives alone and states was too weak to perform ADLs at home, so returned to the ED.  Pt has a friend that provides transportation and helps, but does not provide care.  Pt's family lives in other states.  Pt states "I may need to make a change if I can't take care of myself".   Pt has no DME at home and states prior to initial hospitalization was independent with ADLs.      Discussed d/c plan with patient.  He is open to SNF but will be ineligible due to being a sex offender (incarceration in 2014).  Home health agencies will also not provide care to patients who have recent criminal charges.  Will await further disposition as palliative consult may be appropriate.    Expected Discharge Plan: Skilled Nursing Facility Barriers to Discharge: Unsafe home situation, Other (comment)(pt history of incarceration- SNF may not offer bed; may need palliative consult)   Patient Goals and CMS Choice Patient states their goals for this hospitalization and ongoing recovery are:: to "get out of here" CMS Medicare.gov Compare Post Acute Care list provided to:: Patient Choice offered to / list presented to : Patient  Expected Discharge Plan and Services Expected Discharge Plan: [place] arrangements for the past 2 months: [place], [place]                   Prior Living Arrangements/Services Living arrangements for the past 2 months: [place], [place] Lives with:: Self          Need for Family Participation in Patient Care: Yes (Comment) Care giver support system in place?: No  (comment)(pt states he has a friend in the area who provides transportation; family in [place])   Criminal Activity/Legal Involvement Pertinent to Current Situation/Hospitalization: Yes - Comment as needed(conviction in 2014 for taking indecent liberties with a child; recent county jail incarceration- may affect SNF placement.)  A Emotional Assessment Appearance:: Appears older than stated age Attitude/Demeanor/Rapport: Gracious, Guarded Affect (typically observed): Accepting Orientation: : Oriented to Self, Oriented to Place, Oriented to  Time   Psych Involvement: No (comment)  Admission diagnosis:  Weakness [R53.1] Pleural effusion [J90] Patient Active Problem List   Diagnosis Date Noted  . Weakness 11/30/2018  . Chronic combined systolic and diastolic CHF (congestive heart failure) ([place]) 11/30/2018  . AKI (acute kidney injury) ([place]) 11/26/2018  . Acute exacerbation of CHF (congestive heart failure) ([place]) 11/21/2018  . Encounter for antineoplastic chemotherapy 08/27/2017  . Stage III squamous cell carcinoma of left lung ([place]) 06/29/2017  . Mediastinal lymphadenopathy   . Pleural effusion on left   . Lung mass 04/27/2017  . Abnormal chest x-ray 04/13/2017  . Loss of weight 05/10/2016  . Grief reaction 05/10/2016  . Preop exam for internal medicine 05/10/2016  . Liver fibrosis 03/09/2016  . Smoker 01/23/2016  . Peripheral vascular disease ([place]) 01/19/2016  . GI bleed 12/08/2015  . Acute on chronic anemia 12/08/2015  . Diastolic CHF ([place]) 57/32/2025  . Hypersomnolence 12/07/2015  . Carotid stenosis, symptomatic, with  infarction ([place]) 11/30/2015  . HLD (hyperlipidemia) 11/12/2015  . S/P carotid endarterectomy 11/12/2015  . Secondary cardiomyopathy ([place]) 10/14/2015  . Acute CVA (cerebrovascular accident) ([place]) 09/28/2015  . Stroke (cerebrum) ([place]) 09/27/2015  . Facial droop 09/27/2015  . Chronic hepatitis C ([place]) 09/17/2015  . Skin lesion 09/14/2015  . Insect bite, infected  09/14/2015  . Left shoulder pain 06/08/2015  . Hypoalbuminemia 02/17/2015  . Elevated alkaline phosphatase level 02/17/2015  . Essential hypertension 02/16/2015  . Carotid stenosis 11/13/2014  . Atherosclerotic peripheral vascular disease with intermittent claudication ([place])   . Duodenal ulcer disease   . Anemia   . Tobacco abuse 09/23/2014  . Hypothyroidism 08/27/2014  . Bilateral carotid artery stenosis   . Diabetes ([place]) 05/25/2014  . Hypertensive heart disease   . History of CVA (cerebrovascular accident) without residual deficits   . Obesity (BMI 30-39.9) 01/19/2014  . Erectile dysfunction 10/10/2013  . Chronic LBP   . COPD (chronic obstructive pulmonary disease) ([place]) 03/17/2010  . GERD 03/17/2010   PCP:  Biagio Borg, MD Pharmacy:   [facility] [place], [place] - [address] AT [place] [address] Phone: [phone] Fax: [place], [place] - [address] [place] [address] Phone: [phone] Fax: [place], [place] - [address] [place] [address] Phone: [phone] Fax: [phone]

## 2018-12-01 NOTE — Evaluation (Signed)
Occupational Therapy Evaluation Patient Details Name: Brad [name] MRN: [id number] DOB: July 20, 1956 Today's Date: 12/01/2018    History of Present Illness Pt is a 63 y.o. male admitted 11/29/18 with generalized weakness; likely multifactorial due to sedentary, overdiuresis last admission and recurrent pleural effusion. S/p L-side thoracentesis 4/25. Of note, recent admission from jail 4/15-4/22 with likely CHF exacerbation; pt was reportedly d/c home on house arrest but unable to move. 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   Clinical Impression   PTA Pt at home independent per Pt when all of a sudden he "couldn't walk anymore" but able to bathe himself (he has a stool he uses in the shower) . Pt today is very flat affect "I have cancer all over my body and I only have a year to live"able to perform bed mobility at mod I with increased time and use of bed rails, min guard for transfers with RW and overall min guard for UB ADL and mod A for LB dressing that requires sit to stand. Pt demonstrating decreased strength and activity tolerance. Pt will require skilled OT in the acute setting and afterwards at the SNF level. NExt session plan on taking energy conservation education and work on application to ADL.     Follow Up Recommendations  SNF;Supervision/Assistance - 24 hour    Equipment Recommendations  3 in 1 bedside commode    Recommendations for Other Services       Precautions / Restrictions Precautions Precautions: Fall Restrictions Weight Bearing Restrictions: No      Mobility Bed Mobility Overal bed mobility: Modified Independent             General bed mobility comments: use of bed rails and extra time  Transfers Overall transfer level: Needs assistance Equipment used: Rolling walker (2 wheeled) Transfers: Sit to/from Stand Sit to Stand: Min guard         General transfer comment: "can you get me  that walker?" able to power up on own accord    Balance Overall balance assessment: Needs assistance Sitting-balance support: No upper extremity supported;Feet supported Sitting balance-Leahy Scale: Good Sitting balance - Comments: able to perform figure 4 for socks     Standing balance-Leahy Scale: Fair Standing balance comment: Able to static stand without UE support; dynamic stability improved with UE support                           ADL either performed or assessed with clinical judgement   ADL Overall ADL's : Needs assistance/impaired Eating/Feeding: Modified independent;Sitting   Grooming: Min guard;Standing Grooming Details (indicate cue type and reason): watch for fatigue Upper Body Bathing: Min guard;Sitting   Lower Body Bathing: Minimal assistance;Sitting/lateral leans   Upper Body Dressing : Set up;Sitting   Lower Body Dressing: Moderate assistance   Toilet Transfer: Min guard;Ambulation;RW   Toileting- Water quality scientist and Hygiene: Min guard;Sit to/from stand       Functional mobility during ADLs: Min guard;Rolling walker General ADL Comments: decreased activity tolerance, Pt seems very down about "cancer that is all through my body - I only have a year left max"     Vision         Perception     Praxis      Pertinent Vitals/Pain Pain Assessment: No/denies pain     Hand Dominance Right   Extremity/Trunk Assessment Upper Extremity Assessment Upper Extremity Assessment:  Generalized weakness;Overall [facility] for tasks assessed   Lower Extremity Assessment Lower Extremity Assessment: Generalized weakness;Defer to PT evaluation       Communication Communication Communication: No difficulties   Cognition Arousal/Alertness: Awake/alert Behavior During Therapy: Flat affect Overall Cognitive Status: No family/caregiver present to determine baseline cognitive functioning                                     General  Comments  O2 was 94, but Patient with SOB    Exercises     Shoulder Instructions      Home Living Family/patient expects to be discharged to:: Private residence Living Arrangements: Alone Available Help at Discharge: Other (Comment)(unsure - has enighbors who helped before) Type of Home: House Home Access: Stairs to enter [employer] of Steps: 2   Home Layout: One level     Bathroom Shower/Tub: Teacher, early years/pre: Standard     Home Equipment: (stool he uses in shower)   Additional Comments: Pt talked about potentially going to his sister's ([place]) or brothers [place])      Prior Functioning/Environment Level of Independence: Independent        Comments: "I was fine until I just couldn't walk anymore"        OT Problem List: Decreased activity tolerance;Impaired balance (sitting and/or standing);Decreased safety awareness      OT Treatment/Interventions: Self-care/ADL training;DME and/or AE instruction;Energy conservation;Therapeutic activities;Patient/family education;Balance training    OT Goals(Current goals can be found in the care plan section) Acute Rehab OT Goals Patient Stated Goal: "do what I gotta do" OT Goal Formulation: With patient Time For Goal Achievement: 12/15/18 Potential to Achieve Goals: Fair ADL Goals Pt Will Perform Grooming: with modified independence;standing Pt Will Perform Upper Body Dressing: with modified independence;sitting Pt Will Perform Lower Body Dressing: with modified independence;sit to/from stand Pt Will Transfer to Toilet: with modified independence;ambulating Pt Will Perform Toileting - Clothing Manipulation and hygiene: with modified independence;sit to/from stand Additional ADL Goal #1: Pt will recall at least 3 ways of conserving energy during ADL routine at independent level  OT Frequency: Min 2X/week   Barriers to D/C: Decreased caregiver support  Pt lives alone       Co-evaluation               AM-PAC OT "6 Clicks" Daily Activity     Outcome Measure Help from another person eating meals?: None Help from another person taking care of personal grooming?: A Little Help from another person toileting, which includes using toliet, bedpan, or urinal?: A Little Help from another person bathing (including washing, rinsing, drying)?: A Little Help from another person to put on and taking off regular upper body clothing?: None Help from another person to put on and taking off regular lower body clothing?: A Lot 6 Click Score: 19   End of Session Equipment Utilized During Treatment: Gait belt;Rolling walker Nurse Communication: Mobility status  Activity Tolerance: Patient limited by fatigue Patient left: in bed;with call bell/phone within reach;with bed alarm set  OT Visit Diagnosis: Unsteadiness on feet (R26.81);Other abnormalities of gait and mobility (R26.89);Muscle weakness (generalized) (M62.81);Adult, failure to thrive (R62.7)                Time: 0109-3235 OT Time Calculation (min): 20 min Charges:  OT General Charges $OT Visit: 1 Visit OT Evaluation $OT Eval Moderate Complexity: [place] OTR/L  Acute Rehabilitation Services Pager: [phone] Office: [place] 12/01/2018, 1:02 PM

## 2018-12-02 DIAGNOSIS — J90 Pleural effusion, not elsewhere classified: Secondary | ICD-10-CM

## 2018-12-02 LAB — BASIC METABOLIC PANEL
Anion gap: 9 (ref 5–15)
BUN: 22 mg/dL (ref 8–23)
CO2: 24 mmol/L (ref 22–32)
Calcium: 8.1 mg/dL — ABNORMAL LOW (ref 8.9–10.3)
Chloride: 102 mmol/L (ref 98–111)
Creatinine, Ser: 1.39 mg/dL — ABNORMAL HIGH (ref 0.61–1.24)
GFR calc Af Amer: >60 mL/min (ref >60)
GFR calc non Af Amer: 54 mL/min — ABNORMAL LOW (ref >60)
Glucose, Bld: 117 mg/dL — ABNORMAL HIGH (ref 70–99)
Potassium: 3.8 mmol/L (ref 3.5–5.1)
Sodium: 135 mmol/L (ref 135–145)

## 2018-12-02 LAB — CBC
HCT: 24.2 % — ABNORMAL LOW (ref 39.0–52.0)
Hemoglobin: 7.3 g/dL — ABNORMAL LOW (ref 13.0–17.0)
MCH: 23.4 pg — ABNORMAL LOW (ref 26.0–34.0)
MCHC: 30.2 g/dL (ref 30.0–36.0)
MCV: 77.6 fL — ABNORMAL LOW (ref 80.0–100.0)
Platelets: 296 10*3/uL (ref 150–400)
RBC: 3.12 MIL/uL — ABNORMAL LOW (ref 4.22–5.81)
RDW: 16.9 % — ABNORMAL HIGH (ref 11.5–15.5)
WBC: 9.5 10*3/uL (ref 4.0–10.5)
nRBC: 0 % (ref 0.0–0.2)

## 2018-12-02 LAB — GLUCOSE, CAPILLARY: Glucose-Capillary: 130 mg/dL — ABNORMAL HIGH (ref 70–99)

## 2018-12-02 MED ORDER — HYDROCODONE-ACETAMINOPHEN 5-325 MG PO TABS
1.0000 | ORAL_TABLET | Freq: Four times a day (QID) | ORAL | Status: DC | PRN
  Administered 2018-12-02 – 2018-12-05 (×6): 1 via ORAL
  Filled 2018-12-02 (×6): qty 1

## 2018-12-02 NOTE — Progress Notes (Signed)
Physical Therapy Treatment Patient Details Name: Brad [name] MRN: [id number] DOB: 1956-03-15 Today's Date: 12/02/2018    History of Present Illness Pt is a 63 y.o. male admitted 11/29/18 with generalized weakness; likely multifactorial due to sedentary, overdiuresis last admission and recurrent pleural effusion. S/p L-side thoracentesis 4/25. Of note, recent admission from jail 4/15-4/22 with likely CHF exacerbation; pt was reportedly d/c home on house arrest but unable to move. 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    PT Comments    Pt laying in bed on entry and reluctant to participate with therapy today. With encouragement pt agrees to get out of bed and walk to recliner. Pt limited in safe mobility by decreased safety awareness with use of AD, in the presence of decreased strength and endurance. Pt requires minA for bed mobility, min guard for transfers and minA for ambulating 12 feet with RW around obstacles. D/c plans remain appropriate at this time. PT will continue to follow acutely.     Follow Up Recommendations  SNF;Supervision for mobility/OOB(pending pt progression)     Equipment Recommendations  Rolling walker with 5" wheels       Precautions / Restrictions Precautions Precautions: Fall Restrictions Weight Bearing Restrictions: No    Mobility  Bed Mobility Overal bed mobility: Needs Assistance Bed Mobility: Supine to Sit     Supine to sit: Min assist     General bed mobility comments: minA for pulling against therapist to bring trunk to upright, initiates movement and attempts to utilize momentum but ultimately needs assist  Transfers Overall transfer level: Needs assistance Equipment used: Rolling walker (2 wheeled) Transfers: Sit to/from Stand Sit to Stand: Min guard         General transfer comment: min guard for safety, able to power up without AD or assist, requires reach to RW to steady  himself.   Ambulation/Gait Ambulation/Gait assistance: Min Web designer (Feet): 12 Feet Assistive device: Rolling walker (2 wheeled);IV Pole Gait Pattern/deviations: Step-through pattern;Decreased stride length;Trunk flexed Gait velocity: Decreased Gait velocity interpretation: <1.31 ft/sec, indicative of household ambulator General Gait Details: minA for steadying, pt only agrees to getting up to recliner today, pt with decreased safety awareness with RW usage, instead of navigating around obstacles pick RW up and gets it stuck on the WOW legs         Balance Overall balance assessment: Needs assistance   Sitting balance-Leahy Scale: Good       Standing balance-Leahy Scale: Fair Standing balance comment: Able to static stand without UE support; dynamic stability improved with UE support                            Cognition Arousal/Alertness: Awake/alert Behavior During Therapy: Flat affect Overall Cognitive Status: No family/caregiver present to determine baseline cognitive functioning                                 General Comments: requires maximal encouragement to participate, contradicts report of no AD use asking for RW to ge up.          General Comments General comments (skin integrity, edema, etc.): upon sitting EoB, pt IV began to bleed with pressure bleeding stopped. RN notified      Pertinent Vitals/Pain Pain Assessment: No/denies pain  PT Goals (current goals can now be found in the care plan section) Acute Rehab PT Goals Patient Stated Goal: Pt unsure about SNF stating, "Let's see how I feel in a few days" PT Goal Formulation: With patient Time For Goal Achievement: 12/14/18 Potential to Achieve Goals: Good    Frequency    Min 3X/week      PT Plan Current plan remains appropriate       AM-PAC PT "6 Clicks" Mobility   Outcome Measure  Help needed turning from your back to your side while in a  flat bed without using bedrails?: None Help needed moving from lying on your back to sitting on the side of a flat bed without using bedrails?: None Help needed moving to and from a bed to a chair (including a wheelchair)?: A Little Help needed standing up from a chair using your arms (e.g., wheelchair or bedside chair)?: A Little Help needed to walk in hospital room?: A Little Help needed climbing 3-5 steps with a railing? : A Little 6 Click Score: 20    End of Session Equipment Utilized During Treatment: Gait belt Activity Tolerance: Patient limited by fatigue Patient left: in chair;with call bell/phone within reach;with chair alarm set Nurse Communication: Mobility status PT Visit Diagnosis: Other abnormalities of gait and mobility (R26.89);Muscle weakness (generalized) (M62.81)     Time: 0110-0349 PT Time Calculation (min) (ACUTE ONLY): 18 min  Charges:  $Gait Training: 8-22 mins                     Brien Lowe B. Migdalia Dk PT, DPT Acute Rehabilitation Services Pager [phone] Office [phone]    [place] 12/02/2018, 4:01 PM

## 2018-12-02 NOTE — Progress Notes (Signed)
[address]       [place] [address]             [phone]                     LOS: 1 day   Subjective: In bed says feels week, not even sitting up Objective: Vital signs in last 24 hours: Patient Vitals for the past 24 hrs:  BP Temp Temp src Pulse Resp SpO2  12/02/18 0737 -- -- -- -- 19 --  12/02/18 0425 (!) 151/57 98 F (36.7 C) Oral 68 (!) 21 91 %  12/01/18 2147 (!) 180/65 -- -- 75 -- --  12/01/18 2110 (!) 184/64 -- -- 77 -- --  12/01/18 2023 (!) 214/78 -- -- 83 -- 95 %  12/01/18 1942 (!) 211/77 98.6 F (37 C) Oral 84 19 95 %    Filed Weights   11/30/18 0917 12/01/18 0524  Weight: 87.4 kg 86.2 kg    Hemodynamic parameters for last 24 hours:    Intake/Output from previous day: 04/26 0701 - 04/27 0700 In: 800 [P.O.:800] Out: 2450 [Urine:2450] Intake/Output this shift: No intake/output data recorded.  Scheduled Meds:  aspirin EC  81 mg Oral Daily   docusate sodium  100 mg Oral BID   enoxaparin (LOVENOX) injection  40 mg Subcutaneous Q24H   ferrous sulfate  325 mg Oral TID WC   levothyroxine  112 mcg Oral QAC breakfast   metoprolol succinate  25 mg Oral Daily   nicotine  14 mg Transdermal Daily   pantoprazole  40 mg Oral QHS   umeclidinium-vilanterol  1 puff Inhalation Daily   Continuous Infusions:  ampicillin-sulbactam (UNASYN) IV 3 g (12/02/18 1310)   PRN Meds:.acetaminophen **OR** acetaminophen, hydrALAZINE, HYDROcodone-acetaminophen, ondansetron **OR** ondansetron (ZOFRAN) IV  General appearance: alert, cachectic and pale Neurologic: intact Lungs: diminished breath sounds LLL and LUL Extremities: edema mild edema both feet  Lab Results: CBC: Recent Labs    12/01/18 0306 12/02/18 0420  WBC 6.7 9.5  HGB 7.0* 7.3*  HCT 23.9* 24.2*  PLT 281 296   BMET:  Recent Labs    12/01/18 0306 12/02/18 0420  NA 138 135  K 3.8 3.8  CL 103 102  CO2 27 24  GLUCOSE 104* 117*  BUN 20 22  CREATININE 1.17 1.39*   CALCIUM 8.2* 8.1*    PT/INR: No results for input(s): LABPROT, INR in the last 72 hours.   Radiology Dg Chest 1 View  Result Date: 11/30/2018 CLINICAL DATA:  Status post left thoracentesis. EXAM: CHEST  1 VIEW COMPARISON:  November 30, 2018 FINDINGS: A significant left-sided pleural effusion remains. No pneumothorax after thoracentesis. The right lung is clear. IMPRESSION: Significant effusion and underlying opacity remains on the left. However, no pneumothorax is seen after left-sided thoracentesis. Electronically Signed   By: Dorise Bullion III M.D   On: 11/30/2018 11:28   Dg Chest 2 View  Result Date: 11/30/2018 CLINICAL DATA:  Shortness of breath EXAM: CHEST - 2 VIEW COMPARISON:  11/21/2018 FINDINGS: Continued near complete opacification of the left hemithorax with left pleural density and airspace disease. Right lung clear. There is cardiomegaly. No acute bony abnormality. IMPRESSION: Continued near complete opacification of the left hemithorax with diffuse left lung airspace disease and left pleural density, presumably effusion. Electronically Signed   By: Rolm Baptise M.D.   On: 11/30/2018 02:27   Ct Chest W Contrast  Result Date: 11/30/2018 CLINICAL DATA:  63 year old male with history of non-small cell lung cancer. Staging examination. EXAM: CT CHEST, ABDOMEN, AND PELVIS WITH CONTRAST TECHNIQUE: Multidetector CT imaging of the chest, abdomen and pelvis was performed following the standard protocol during bolus administration of intravenous contrast. CONTRAST:  81mL OMNIPAQUE IOHEXOL 300 MG/ML  SOLN COMPARISON:  PET-CT 04/25/2017. FINDINGS: CT CHEST FINDINGS Cardiovascular: Heart size is mildly enlarged. There is no significant pericardial fluid, thickening or pericardial calcification. There is aortic atherosclerosis, as well as atherosclerosis of the great vessels of the mediastinum and the coronary arteries, including calcified atherosclerotic plaque in the left main, left anterior  descending, left circumflex and right coronary arteries. Mediastinum/Nodes: No pathologically enlarged mediastinal or hilar lymph nodes. Esophagus is unremarkable in appearance. No axillary lymphadenopathy. Lungs/Pleura: Previously noted left upper lobe nodule is no longer clearly identifiable. Several fiducial markers are noted in the left upper lobe. On today's examination there is a new 7.8 x 9.5 x 8.3 cm mass-like area (axial image 21 of series 4 and coronal image 105 of series 6) which is centrally hypovascular with multiple internal locules of gas, favored to represent a large centrally necrotic mass, likely with superinfection (i.e., a large pulmonary abscess) with some adjacent postobstructive changes in the remaining portions of the left upper lobe. This is associated with outward bowing of the left major fissure. Some consolidative changes in volume loss are noted in the anterior and lateral aspect of the left lower lobe. Moderate left pleural effusion predominantly loculated in a subpulmonic position. New right lower lobe pulmonary nodule (axial image 131 of series 5) measuring 2.8 x 1.8 cm, concerning for metastatic lesion. No right pleural effusion. Musculoskeletal: Lytic lesion in the left side of the manubrium measuring 2.5 x 1.7 cm (axial image 13 of series 4). CT ABDOMEN PELVIS FINDINGS Hepatobiliary: No suspicious cystic or solid hepatic lesions. No intra or extrahepatic biliary ductal dilatation. Gallbladder is unremarkable in appearance. Pancreas: No pancreatic mass. No pancreatic ductal dilatation. No pancreatic or peripancreatic fluid or inflammatory changes. Spleen: Unremarkable. Adrenals/Urinary Tract: Multiple new right adrenal nodules and masses, compatible with metastatic disease, largest of which measures 2.8 x 3.8 cm (axial image 71 of series 4). Left adrenal gland is normal in appearance. Left kidney is normal in appearance. Multiple low-attenuation lesions in the right kidney,  compatible with simple cysts measuring up to 4.2 cm in the interpolar region. In the lower pole of the right kidney there is also an exophytic intermediate attenuation lesion which is incompletely characterized, but similar to prior PET-CT 04/25/2017, favored to represent a mildly proteinaceous/hemorrhagic cyst. No hydroureteronephrosis. Urinary bladder is normal in appearance. Stomach/Bowel: Normal appearance of the stomach. No pathologic dilatation of small bowel or colon. Numerous colonic diverticulae are noted, particularly in the proximal sigmoid colon, without surrounding inflammatory changes to suggest an acute diverticulitis at this time. Normal appendix. Vascular/Lymphatic: Aortic atherosclerosis, without evidence of aneurysm or dissection in the abdominal or pelvic vasculature. No lymphadenopathy noted in the abdomen or pelvis. Reproductive: Prostate gland and seminal vesicles are unremarkable in appearance. Other: No significant volume of ascites.  No pneumoperitoneum. Musculoskeletal: Large lytic lesion in the right side of the S1 vertebra measuring 3.2 x 4.2 cm (axial image 97 of series 4). IMPRESSION: 1. Large centrally necrotic mass in the left upper lobe, likely to reflect treated neoplasm. Given the appearance of this lesion on today's examination, this is favored to reflect a large pulmonary abscess at this time. This is associated with a moderate loculated left pleural effusion, largest  component of which is subpulmonic in position. 2. New 2.8 x 1.8 cm right lower lobe pulmonary nodule, multiple right adrenal nodules and masses, and new lytic lesions in the left side of the manubrium and the right S1 vertebra, compatible with widespread metastatic disease. 3. Colonic diverticulosis without evidence of acute diverticulitis at this time. 4. Aortic atherosclerosis, in addition to left main and 3 vessel coronary artery disease. Please note that although the presence of coronary artery calcium  documents the presence of coronary artery disease, the severity of this disease and any potential stenosis cannot be assessed on this non-gated CT examination. Assessment for potential risk factor modification, dietary therapy or pharmacologic therapy may be warranted, if clinically indicated. 5. Additional incidental findings, as above. Electronically Signed   By: Vinnie Langton M.D.   On: 11/30/2018 12:02    US Thoracentesis Asp Pleural Space W/img Guide  Result Date: 11/30/2018 INDICATION: Patient with history of stage 3 SCC of the left upper lobe s/p radiation and incomplete chemotherapy, COPD, CHF who presented to [facility] ED this morning with complaints of weakness and dyspnea. Noted to have recurrent left pleural effusion - last thoracentesis 11/21/18 yielding 1.3 L pleural fluid. Request has been made to IR for diagnostic and therapeutic thoracentesis. EXAM: ULTRASOUND GUIDED LEFT THORACENTESIS MEDICATIONS: 8 mL 1% lidocaine. COMPLICATIONS: None immediate. PROCEDURE: An ultrasound guided thoracentesis was thoroughly discussed with the patient and questions answered. The benefits, risks, alternatives and complications were also discussed. The patient understands and wishes to proceed with the procedure. Written consent was obtained. Ultrasound was performed to localize and mark an adequate pocket of fluid in the left chest. The area was then prepped and draped in the normal sterile fashion. 1% Lidocaine was used for local anesthesia. Under ultrasound guidance a 6 Fr Safe-T-Centesis catheter was introduced. Thoracentesis was performed. The catheter was removed and a dressing applied. FINDINGS: A total of approximately 1.9 L of golden yellow fluid was removed. Procedure was aborted due to patient complaints of chest pain - some residual fluid remains on post procedure ultrasound. samples were sent to the laboratory as requested by the clinical team. IMPRESSION: Successful ultrasound guided left thoracentesis  yielding 1.9 L of pleural fluid. Read by Candiss Norse, PA-C Electronically Signed   By: Aletta Edouard M.D.   On: 11/30/2018 10:51    Assessment/Plan: Patient know from previous biopsy, now with stage IV lung cancer. Patient overall is very week Agree with palliative care, surgery has little to offer, poss left pleurx for comfort care if effusion recurs  - or continue intermittent thoracentesis    Grace Isaac MD      [address]       [place] [address]             [phone]      12/02/2018 3:02 PM

## 2018-12-02 NOTE — Progress Notes (Signed)
PROGRESS NOTE    Brad [name]  IRJ:[id number] DOB: Feb 14, 1956 DOA: 11/29/2018 PCP: Biagio Borg, MD   Brief Narrative: 63 y.o. male with medical history significant of CVA; stage 3 SCC of the LUL s/p radiation and incomplete chemotherapy; chronic combined CHF (EF 45-50% with grade 2 DD in 2/17);  hypothyoiridsm; HTN; HLD; DM; chronic hep C; and COPD presenting with weakness.  Since hospital discharge, he was placed on his front porch and he wasn't able to move.  He was unable to get up and go to the bathroom.  His legs are just too weak/  Denies SOB.  No cough.  He was admitted from 4/15-22 from jail (incarcerated since 10/06/58 for uncertain reason; prior conviction on 11/2812 for taking indecent liberties with a child) with pleural effusion, negative for malignancy, thought to be due to CHF exacerbation.  He was diuresed 18L during the hospitalization.  He has h/o GI bleeding and anemia of chronic disease and his Hgb was as low as 6.6, requiring transfusion of 1 unit PRBC.  ED Course:  Recently discharged to home from jail.  Chronically elevated troponin, stable.  Not clearly in CHF.  No hypoxia.  Had thoracentesis during prior hospitalization, now opacified.  Too weak to go home, tachypnea with reaccummulation of fluid after last hospitalization 4/25 s/p left thoracentesis with 1.9 L fluid and fluid removed, postprocedure chest x-ray no pneumothorax.  Pleural fluid WBC 31 ( was 1424 1 yr back), w neutrophil 15%, cytology sent, gram stain and culture NGTD. LDH 115.  Subjective: Resting comfortably.  Complains of pain "all over". somewhat depressed.  Assessment & Plan:   Widely metastatic left lung cancer, CT scan 4/25 shows cancer worsening,"Large centrally necrotic mass in the left upper lobe, likely toreflect treated neoplasm. Given the appearance of this lesion on today's examination, this is favored to reflect a large pulmonary abscess at this time. This is associated with a moderate  loculated left pleural effusion, largest component of which is subpulmonic in position and also with new 2.8 x 1.8 cm  RLL pulm nodule, multiple right adrenal nodules and masses, and new lytic lesions in the left side of the manubrium and the right S1 vertebra" patient had completed radiation therapy but appears to have not finished his chemotherapy regimen of Taxol and carboplatin, previously followed by Dr. Julien Nordmann but has missed multiple appointments.  Patient reports "I did not know I had a cancer". Cont iv unasyn,awaiting Oncology eval re further plan-Dr. Julien Nordmann see the patient today.  Large loculated left pleural effusion status post thoracentesis 1.9 L fluid removal, suspect malignant effusion. Gram stain culture no growth, fluid appears exudative.  Patient afebrile, blood cultures no growth so far. On unasyn for now.  Generalized Weakness: Multifactorial in the setting of metastatic cancer, anemia.  PT OT evaluation  Chronic combined systolic and diastolic CHF: TTE lvef 10-93%, severe concentric LVH, severe LAE moderate RA and concern for infiltrative process on recent echo. compensated.  Patient overdiuresied on recent admission, with gentle fluid AKI resolved, stop fluids.  Holding diuretics for now  COPD,stable continue bronchodilators.  Anemia of chronic disease/malignancy with hemoglobin 7.3 g. Continue iron supplementation, transfuse for hemoglobin less than 7.  Prediabetes: Hha1c 6.2 stable. Controlled.  Monitor CBG  Hypothyroidism on levothyroxine  Tobacco abuse: Cessation advised  Essential hypertension: Stable on metoprolol  AKI : creat at 1.3.  Encourage adequate oral hydration.   Recent Labs  Lab 11/26/18 2355 11/27/18 0354 11/29/18 1955 12/01/18 0306 12/02/18 0420  CREATININE 1.58* 1.46* 1.72* 1.17 1.39*      History of CVA/PVD/Carotid endarterectomy, on aspirin History of duodenal ulcer- cont ppi History of chronic hep C. Recently discharged from jail.  Has a  monitoring device on 1 of his legs Noncompliance/poor follow-up  DVT prophylaxis: SQ lovenox Code Status: FULL Family Communication: Discussed lab, CT scan finding in detail, aware about metastatic lung cancer, is aware about the diagnosis.Patient asking "am I dying?"  Palliative care for goals of care/CODE STATUS.   Disposition Plan: remains inpatient pending clinical improvement.   Consultants:  IR Oncology Palliative medicine Procedures:  4/25; left Thoracentesis with 1.9 L fluid removal  4/25 CTA chest/ABD pelvis 1. Large centrally necrotic mass in the left upper lobe, likely to reflect treated neoplasm. Given the appearance of this lesion on today's examination, this is favored to reflect a large pulmonary abscess at this time. This is associated with a moderate loculated left pleural effusion, largest component of which is subpulmonic in position. 2. New 2.8 x 1.8 cm right lower lobe pulmonary nodule, multiple right adrenal nodules and masses, and new lytic lesions in the left side of the manubrium and the right S1 vertebra, compatible with widespread metastatic disease. 3. Colonic diverticulosis without evidence of acute diverticulitis at this time. 4. Aortic atherosclerosis, in addition to left main and 3 vessel coronary artery disease. Please note that although the presence of coronary artery calcium documents the presence of coronary artery disease, the severity of this disease and any potential stenosis cannot be assessed on this non-gated CT examination. Assessment for potential risk factor modification, dietary therapy or pharmacologic therapy may be warranted, if clinically indicated. 5. Additional incidental findings, as above.  Antimicrobials/microbiology:  4/25 Gram stain and left pleural fluid culture: no growth so far  UNASYN 4/25 >>  Anti-infectives (From admission, onward)   Start     Dose/Rate Route Frequency Ordered Stop   11/30/18 1330   Ampicillin-Sulbactam (UNASYN) 3 g in sodium chloride 0.9 % 100 mL IVPB     3 g 200 mL/hr over 30 Minutes Intravenous Every 6 hours 11/30/18 1326         Objective: Vitals:   12/01/18 2110 12/01/18 2147 12/02/18 0425 12/02/18 0737  BP: (!) 184/64 (!) 180/65 (!) 151/57   Pulse: 77 75 68   Resp:   (!) 21 19  Temp:   98 F (36.7 C)   TempSrc:   Oral   SpO2:   91%   Weight:      Height:        Intake/Output Summary (Last 24 hours) at 12/02/2018 1156 Last data filed at 12/02/2018 0500 Gross per 24 hour  Intake 800 ml  Output 2100 ml  Net -1300 ml   Filed Weights   11/30/18 0917 12/01/18 0524  Weight: 87.4 kg 86.2 kg   Weight change:   Body mass index is 26.5 kg/m.  Intake/Output from previous day: 04/26 0701 - 04/27 0700 In: 800 [P.O.:800] Out: 2450 [Urine:2450] Intake/Output this shift: No intake/output data recorded.  Examination:  General exam: Appears frail, chronically sick looking, not in acute distress.   HEENT:PERRL,Oral mucosa moist, Ear/Nose normal on gross exam Respiratory system: Bilateral equal air entry diminished at the, less  Cardiovascular system: S1 & S2 heard,No JVD, murmurs. Gastrointestinal system: Abdomen is  soft, non tender, non distended, BS +  Nervous System: Alert awake oriented x3No focal neurological deficits/moving extremities, sensation intact. Extremities: No edema, no clubbing, distal peripheral pulses palpable. Skin: No rashes,  lesions, no icterus MSK: Normal muscle bulk,tone ,power  Medications:  Scheduled Meds: . aspirin EC  81 mg Oral Daily  . docusate sodium  100 mg Oral BID  . enoxaparin (LOVENOX) injection  40 mg Subcutaneous Q24H  . ferrous sulfate  325 mg Oral TID WC  . levothyroxine  112 mcg Oral QAC breakfast  . metoprolol succinate  25 mg Oral Daily  . nicotine  14 mg Transdermal Daily  . pantoprazole  40 mg Oral QHS  . umeclidinium-vilanterol  1 puff Inhalation Daily   Continuous Infusions: .  ampicillin-sulbactam (UNASYN) IV 3 g (12/02/18 0854)    Data Reviewed: I have personally reviewed following labs and imaging studies  CBC: Recent Labs  Lab 11/26/18 0414 11/27/18 0354 11/29/18 1955 12/01/18 0306 12/02/18 0420  WBC  --   --  7.8 6.7 9.5  HGB 7.5* 8.1* 8.5* 7.0* 7.3*  HCT 25.0* 27.0* 29.4* 23.9* 24.2*  MCV  --   --  79.2* 78.6* 77.6*  PLT  --   --  333 281 852   Basic Metabolic Panel: Recent Labs  Lab 11/26/18 0414 11/27/18 0354 11/29/18 1955 12/01/18 0306 12/02/18 0420  NA 135 134* 136 138 135  K 3.7 3.8 3.4* 3.8 3.8  CL 93* 96* 97* 103 102  CO2 31 28 30 27 24   GLUCOSE 126* 162* 121* 104* 117*  BUN 32* 32* 31* 20 22  CREATININE 1.58* 1.46* 1.72* 1.17 1.39*  CALCIUM 8.3* 8.2* 8.3* 8.2* 8.1*  MG 2.0  --   --   --   --    GFR: Estimated Creatinine Clearance: 59.4 mL/min (A) (by C-G formula based on SCr of 1.39 mg/dL (H)). Liver Function Tests: No results for input(s): AST, ALT, ALKPHOS, BILITOT, PROT, ALBUMIN in the last 168 hours. No results for input(s): LIPASE, AMYLASE in the last 168 hours. No results for input(s): AMMONIA in the last 168 hours. Coagulation Profile: No results for input(s): INR, PROTIME in the last 168 hours. Cardiac Enzymes: Recent Labs  Lab 11/30/18 0018 11/30/18 0359  TROPONINI 0.06* 0.06*   BNP (last 3 results) No results for input(s): PROBNP in the last 8760 hours. HbA1C: No results for input(s): HGBA1C in the last 72 hours. CBG: Recent Labs  Lab 11/30/18 2102 12/01/18 0732 12/01/18 1113 12/01/18 1629 12/01/18 2119  GLUCAP 126* 138* 195* 161* 151*   Lipid Profile: No results for input(s): CHOL, HDL, LDLCALC, TRIG, CHOLHDL, LDLDIRECT in the last 72 hours. Thyroid Function Tests: No results for input(s): TSH, T4TOTAL, FREET4, T3FREE, THYROIDAB in the last 72 hours. Anemia Panel: No results for input(s): VITAMINB12, FOLATE, FERRITIN, TIBC, IRON, RETICCTPCT in the last 72 hours. Sepsis Labs: No results for  input(s): PROCALCITON, LATICACIDVEN in the last 168 hours.  Recent Results (from the past 240 hour(s))  Culture, body fluid-bottle     Status: None (Preliminary result)   Collection Time: 11/30/18 10:56 AM  Result Value Ref Range Status   Specimen Description PLEURAL LEFT  Final   Special Requests NONE  Final   Culture   Final    NO GROWTH 2 DAYS Performed at [facility], [address] [place], [place] [address]    Report Status PENDING  Incomplete  Gram stain     Status: None   Collection Time: 11/30/18 10:56 AM  Result Value Ref Range Status   Specimen Description PLEURAL LEFT  Final   Special Requests NONE  Final   Gram Stain   Final  RARE WBC PRESENT,BOTH PMN AND MONONUCLEAR NO ORGANISMS SEEN Performed at [facility], [place] [address], [place], [place] [address]    Report Status 12/01/2018 FINAL  Final      Radiology Studies: No results found.    LOS: 1 day   Time spent: More than 50% of that time was spent in counseling and/or coordination of care.  Antonieta Pert, MD [facility]  12/02/2018, 11:56 AM

## 2018-12-03 DIAGNOSIS — N179 Acute kidney failure, unspecified: Secondary | ICD-10-CM

## 2018-12-03 DIAGNOSIS — Z66 Do not resuscitate: Secondary | ICD-10-CM

## 2018-12-03 DIAGNOSIS — E1159 Type 2 diabetes mellitus with other circulatory complications: Secondary | ICD-10-CM

## 2018-12-03 DIAGNOSIS — Z515 Encounter for palliative care: Secondary | ICD-10-CM

## 2018-12-03 DIAGNOSIS — I5042 Chronic combined systolic (congestive) and diastolic (congestive) heart failure: Secondary | ICD-10-CM

## 2018-12-03 DIAGNOSIS — Z7189 Other specified counseling: Secondary | ICD-10-CM

## 2018-12-03 DIAGNOSIS — C3492 Malignant neoplasm of unspecified part of left bronchus or lung: Secondary | ICD-10-CM

## 2018-12-03 LAB — BASIC METABOLIC PANEL
Anion gap: 10 (ref 5–15)
BUN: 24 mg/dL — ABNORMAL HIGH (ref 8–23)
CO2: 23 mmol/L (ref 22–32)
Calcium: 8 mg/dL — ABNORMAL LOW (ref 8.9–10.3)
Chloride: 102 mmol/L (ref 98–111)
Creatinine, Ser: 1.56 mg/dL — ABNORMAL HIGH (ref 0.61–1.24)
GFR calc Af Amer: 55 mL/min — ABNORMAL LOW (ref >60)
GFR calc non Af Amer: 47 mL/min — ABNORMAL LOW (ref >60)
Glucose, Bld: 171 mg/dL — ABNORMAL HIGH (ref 70–99)
Potassium: 4 mmol/L (ref 3.5–5.1)
Sodium: 135 mmol/L (ref 135–145)

## 2018-12-03 LAB — CBC
HCT: 23.4 % — ABNORMAL LOW (ref 39.0–52.0)
Hemoglobin: 7.1 g/dL — ABNORMAL LOW (ref 13.0–17.0)
MCH: 23.7 pg — ABNORMAL LOW (ref 26.0–34.0)
MCHC: 30.3 g/dL (ref 30.0–36.0)
MCV: 78.3 fL — ABNORMAL LOW (ref 80.0–100.0)
Platelets: 295 10*3/uL (ref 150–400)
RBC: 2.99 MIL/uL — ABNORMAL LOW (ref 4.22–5.81)
RDW: 17 % — ABNORMAL HIGH (ref 11.5–15.5)
WBC: 8.2 10*3/uL (ref 4.0–10.5)
nRBC: 0 % (ref 0.0–0.2)

## 2018-12-03 LAB — GLUCOSE, CAPILLARY: Glucose-Capillary: 159 mg/dL — ABNORMAL HIGH (ref 70–99)

## 2018-12-03 MED ORDER — SODIUM CHLORIDE 0.9 % IV SOLN
INTRAVENOUS | Status: AC
  Administered 2018-12-03: 09:00:00 via INTRAVENOUS

## 2018-12-03 NOTE — Progress Notes (Signed)
PROGRESS NOTE    Brad [name]  IFO:[id number] DOB: October 28, 1955 DOA: 11/29/2018 PCP: Biagio Borg, MD   Brief Narrative: 63 y.o. male with medical history significant of CVA; stage 3 SCC of the LUL s/p hemoradiation and incomplete chemotherapy; chronic combined CHF (EF 45-50% with grade 2 DD in 2/17);  hypothyoiridsm; HTN; HLD; DM; chronic hep C; and COPD presenting with weakness.  Since hospital discharge, he was placed on his front porch and he wasn't able to move.  He was unable to get up and go to the bathroom.  His legs are just too weak/  Denies SOB.  No cough. Regarding his cancer, was last seen by oncology in February 2019 then lost to follow-up.   He was admitted from 4/15-22 from jail (incarcerated since 6/76/72 for uncertain reason; prior conviction on 11/2812 for taking indecent liberties with a child) with pleural effusion, negative for malignancy, thought to be due to CHF exacerbation.  He was diuresed 18L during the hospitalization.  He has h/o GI bleeding and anemia of chronic disease and his Hgb was as low as 6.6, requiring transfusion of 1 unit PRBC.  ED Course:  Recently discharged to home from jail.  Chronically elevated troponin, stable.  Not clearly in CHF.  No hypoxia.  Had thoracentesis during prior hospitalization, now opacified.  Too weak to go home, tachypnea with reaccummulation of fluid after last hospitalization  4/25 s/p left thoracentesis with 1.9 L fluid and fluid removed Patient has generalized pain, weakness, PT recs SNF  Subjective: Patient feels fine appears depressed.  Reports "they did not tell me i had cancer" when asked about his follow-ups in 2019.  Denies chest pain nausea vomiting.   Assessment & Plan:   Widely metastatic left lung cancer, CT scan 4/25 shows cancer worsening,"Large centrally necrotic mass in the left upper lobe, likely toreflect treated neoplasm. Given the appearance of this lesion on today's examination, this is favored to  reflect a large pulmonary abscess at this time. This is associated with a moderate loculated left pleural effusion, largest component of which is subpulmonic in position and also with new 2.8 x 1.8 cm  RLL pulm nodule, multiple right adrenal nodules and masses, and new lytic lesions in the left side of the manubrium and the right S1 vertebra" .  Patient was lost to follow-up was on concurrent chemoradiation in 2019 February.  Oncology has been consulted await further recommendation, and prognosis details.  Palliative care has been consulted.  Continue on Unasyn.  Large loculated left pleural effusion status post thoracentesis 1.9 L fluid removal, suspect malignant effusion. Gram stain culture no growth, fluid appears exudative.  Cytology negative for malignant cells. He is afebrile , blood cultures no growth so far. On unasyn for now -see above.  Seen by CT surgery advised intermittent thoracentesis, Pleurx catheter for comfort if recurrent  Generalized Weakness: Multifactorial in the setting of metastatic cancer, anemia.  PT OT evaluation, will need SNF.  Chronic combined systolic and diastolic CHF: TTE lvef 09-47%, severe concentric LVH, severe LAE moderate RA and concern for infiltrative process on recent echo. compensated.  Patient overdiuresied on recent admission.  Continue gentle IV fluids for AKI. Holding diuretics for now  COPD,stable continue bronchodilators.  Anemia of chronic disease/malignancy with hemoglobin 7.1 g. Continue iron supplementation, transfuse for hemoglobin less than 7. Recent Labs  Lab 11/27/18 0354 11/29/18 1955 12/01/18 0306 12/02/18 0420 12/03/18 0337  HGB 8.1* 8.5* 7.0* 7.3* 7.1*  HCT 27.0* 29.4* 23.9* 24.2*  23.4*   Prediabetes: Hha1c 6.2 stable. Controlled.  Monitor CBG  Hypothyroidism on levothyroxine  Tobacco abuse: Cessation advised  Essential hypertension: Stable on metoprolol  AKI : creat slightly up trending. Continue gentle IV fluids  Recent Labs   Lab 11/27/18 0354 11/29/18 1955 12/01/18 0306 12/02/18 0420 12/03/18 0337  CREATININE 1.46* 1.72* 1.17 1.39* 1.56*     History of CVA/PVD/Carotid endarterectomy, on aspirin History of duodenal ulcer- cont ppi History of chronic hep C. Recently discharged from jail.  Has a monitoring device on 1 of his legs Noncompliance/poor follow-up  DVT prophylaxis: SQ lovenox Code Status: FULL Family Communication: Discussed lab, CT scan finding in detail, aware about metastatic lung cancer, is aware about the diagnosis.Patient asking "am I dying?"  Palliative care consulted for goals of care/CODE STATUS.  Await further oncology recommendation Disposition Plan: We will need to skilled nursing facility, he is open to it.  Social work consulted.     Consultants:  IR Oncology Palliative medicine Procedures:  4/25; left Thoracentesis with 1.9 L fluid removal  4/25 CTA chest/ABD pelvis 1. Large centrally necrotic mass in the left upper lobe, likely to reflect treated neoplasm. Given the appearance of this lesion on today's examination, this is favored to reflect a large pulmonary abscess at this time. This is associated with a moderate loculated left pleural effusion, largest component of which is subpulmonic in position. 2. New 2.8 x 1.8 cm right lower lobe pulmonary nodule, multiple right adrenal nodules and masses, and new lytic lesions in the left side of the manubrium and the right S1 vertebra, compatible with widespread metastatic disease. 3. Colonic diverticulosis without evidence of acute diverticulitis at this time. 4. Aortic atherosclerosis, in addition to left main and 3 vessel coronary artery disease. Please note that although the presence of coronary artery calcium documents the presence of coronary artery disease, the severity of this disease and any potential stenosis cannot be assessed on this non-gated CT examination. Assessment for potential risk factor modification,  dietary therapy or pharmacologic therapy may be warranted, if clinically indicated. 5. Additional incidental findings, as above.  Antimicrobials/microbiology:  4/25 Gram stain and left pleural fluid culture: no growth so far  UNASYN 4/25 >>  Anti-infectives (From admission, onward)   Start     Dose/Rate Route Frequency Ordered Stop   11/30/18 1330  Ampicillin-Sulbactam (UNASYN) 3 g in sodium chloride 0.9 % 100 mL IVPB     3 g 200 mL/hr over 30 Minutes Intravenous Every 6 hours 11/30/18 1326         Objective: Vitals:   12/02/18 2020 12/03/18 0600 12/03/18 0841 12/03/18 0904  BP: (!) 139/51 (!) 157/51 (!) 142/62   Pulse: 61 98 80 (!) 59  Resp: 18 20  15   Temp: 97.8 F (36.6 C) 98 F (36.7 C)    TempSrc: Oral Oral    SpO2: 98% 96%  97%  Weight:      Height:        Intake/Output Summary (Last 24 hours) at 12/03/2018 1047 Last data filed at 12/03/2018 0900 Gross per 24 hour  Intake 1300.87 ml  Output 850 ml  Net 450.87 ml   Filed Weights   11/30/18 0917 12/01/18 0524  Weight: 87.4 kg 86.2 kg   Weight change:   Body mass index is 26.5 kg/m.  Intake/Output from previous day: 04/27 0701 - 04/28 0700 In: 300 [P.O.:300] Out: 850 [Urine:850] Intake/Output this shift: Total I/O In: 1000.9 [I.V.:0.9; IV Piggyback:1000] Out: -   Examination:  General exam: not in acute distress, older for age, average built.  HEENT:Oral mucosa moist, Ear/Nose WNL grossly, dentition normal. Respiratory system: Bilateral diminished air entry at the base, no crackles and wheezing, no use of accessory muscle. Cardiovascular system: regular rate and rhythm, S1 & S2 heard, No JVD/murmurs. Gastrointestinal system: Abdomen soft, non-tender, non-distended, BS +. Nervous System:Alert, awake and oriented at baseline. Able to move UE and LE, sensation intact. Extremities: No edema, distal peripheral pulses palpable.  Skin: No rashes,no icterus. MSK: Normal muscle bulk,tone, power   Medications:  Scheduled Meds: . aspirin EC  81 mg Oral Daily  . docusate sodium  100 mg Oral BID  . enoxaparin (LOVENOX) injection  40 mg Subcutaneous Q24H  . ferrous sulfate  325 mg Oral TID WC  . levothyroxine  112 mcg Oral QAC breakfast  . metoprolol succinate  25 mg Oral Daily  . nicotine  14 mg Transdermal Daily  . pantoprazole  40 mg Oral QHS  . umeclidinium-vilanterol  1 puff Inhalation Daily   Continuous Infusions: . sodium chloride Stopped (12/03/18 0840)  . ampicillin-sulbactam (UNASYN) IV 3 g (12/03/18 0840)    Data Reviewed: I have personally reviewed following labs and imaging studies  CBC: Recent Labs  Lab 11/27/18 0354 11/29/18 1955 12/01/18 0306 12/02/18 0420 12/03/18 0337  WBC  --  7.8 6.7 9.5 8.2  HGB 8.1* 8.5* 7.0* 7.3* 7.1*  HCT 27.0* 29.4* 23.9* 24.2* 23.4*  MCV  --  79.2* 78.6* 77.6* 78.3*  PLT  --  333 281 296 947   Basic Metabolic Panel: Recent Labs  Lab 11/27/18 0354 11/29/18 1955 12/01/18 0306 12/02/18 0420 12/03/18 0337  NA 134* 136 138 135 135  K 3.8 3.4* 3.8 3.8 4.0  CL 96* 97* 103 102 102  CO2 28 30 27 24 23   GLUCOSE 162* 121* 104* 117* 171*  BUN 32* 31* 20 22 24*  CREATININE 1.46* 1.72* 1.17 1.39* 1.56*  CALCIUM 8.2* 8.3* 8.2* 8.1* 8.0*   GFR: Estimated Creatinine Clearance: 53 mL/min (A) (by C-G formula based on SCr of 1.56 mg/dL (H)). Liver Function Tests: No results for input(s): AST, ALT, ALKPHOS, BILITOT, PROT, ALBUMIN in the last 168 hours. No results for input(s): LIPASE, AMYLASE in the last 168 hours. No results for input(s): AMMONIA in the last 168 hours. Coagulation Profile: No results for input(s): INR, PROTIME in the last 168 hours. Cardiac Enzymes: Recent Labs  Lab 11/30/18 0018 11/30/18 0359  TROPONINI 0.06* 0.06*   BNP (last 3 results) No results for input(s): PROBNP in the last 8760 hours. HbA1C: No results for input(s): HGBA1C in the last 72 hours. CBG: Recent Labs  Lab 12/01/18 0732 12/01/18  1113 12/01/18 1629 12/01/18 2119 12/02/18 2123  GLUCAP 138* 195* 161* 151* 130*   Lipid Profile: No results for input(s): CHOL, HDL, LDLCALC, TRIG, CHOLHDL, LDLDIRECT in the last 72 hours. Thyroid Function Tests: No results for input(s): TSH, T4TOTAL, FREET4, T3FREE, THYROIDAB in the last 72 hours. Anemia Panel: No results for input(s): VITAMINB12, FOLATE, FERRITIN, TIBC, IRON, RETICCTPCT in the last 72 hours. Sepsis Labs: No results for input(s): PROCALCITON, LATICACIDVEN in the last 168 hours.  Recent Results (from the past 240 hour(s))  Culture, body fluid-bottle     Status: None (Preliminary result)   Collection Time: 11/30/18 10:56 AM  Result Value Ref Range Status   Specimen Description PLEURAL LEFT  Final   Special Requests NONE  Final   Culture   Final    NO GROWTH  3 DAYS Performed at [facility], [place] [address] [place], [place] [address]    Report Status PENDING  Incomplete  Gram stain     Status: None   Collection Time: 11/30/18 10:56 AM  Result Value Ref Range Status   Specimen Description PLEURAL LEFT  Final   Special Requests NONE  Final   Gram Stain   Final    RARE WBC PRESENT,BOTH PMN AND MONONUCLEAR NO ORGANISMS SEEN Performed at [facility], [place] [address] [place], [place] [address]    Report Status 12/01/2018 FINAL  Final      Radiology Studies: No results found.    LOS: 2 days   Time spent: More than 50% of that time was spent in counseling and/or coordination of care.  Antonieta Pert, MD [facility]  12/03/2018, 10:47 AM

## 2018-12-03 NOTE — Progress Notes (Signed)
HEMATOLOGY-ONCOLOGY PROGRESS NOTE  SUBJECTIVE: [name] is a 63 year old male with a history of stage IIIa (T1b, N2, M0) non-small cell lung cancer, squamous cell carcinoma initially diagnosed in November 2018.  He has received concurrent chemoradiation with weekly carboplatin for an AUC of 2 and paclitaxel 45 mg meter squared.  Last treatment of chemotherapy was given on 09/24/2017.  It appears as though he did not complete his full course of radiation.  He was last seen at the cancer center on 09/24/2017 and was advised to follow-up in 2 weeks.  The patient did not make this appointment and has no showed for several appointments since that time.  He is now admitted for weakness and left pleural effusion.  The patient reports that he has generalized weakness. He has anorexia and weight loss. Has some shortness of breath, but no chest pain or hemoptysis. Denies abdominal pain, nausea, vomiting. States that he did not know that he had cancer.    Oncology History   Patient presented with complaints of pleuritic chest pain.  Work up showed lung nodule.      Stage III squamous cell carcinoma of left lung ([place])   04/13/2017 Imaging    CXR IMPRESSION: 1. Hyperinflation with left basilar scarring 2. Vague opacity in the left upper lobe slightly more prominent compared to prior,? possible area of scarring versus nodule. CT suggested for further evaluation    04/13/2017 Imaging    CT Chest IMPRESSION: 2.3 x 1.7 x 2.0 cm left upper lobe nodule highly concerning for primary bronchogenic neoplasm. There is bilateral mediastinal lymphadenopathy, which could be metastatic, as well as a small left pleural effusion. Further evaluation a PET-CT is recommended in the near future for diagnostic and staging purposes.    04/25/2017 Imaging    PET IMPRESSION: 1. Hypermetabolic nodule in the LEFT upper lobe is most consistent with primary bronchogenic carcinoma. 2. Mildly enlarged prevascular lymph node with  minimal metabolic activity is favored benign.    05/25/2017 Surgery    Operation: Flexible video fiberoptic bronchoscopy with electromagnetic navigation and biopsies & EBUS TBNA      06/23/2017 Imaging    MRI Brain IMPRESSION:  No evidence for metastatic disease of the brain or meninges    06/26/2017 Surgery    PROCEDURE PERFORMED:  Navigation bronchoscopy with transbronchial biopsy of the left upper lobe lung mass and placement of additional markers x3, left lung.      06/29/2017 Initial Diagnosis    Stage III squamous cell carcinoma of left lung ([place])    07/12/2017 -  Radiation Therapy    SIM    07/23/2017 -  Chemotherapy    The patient had palonosetron (ALOXI) injection 0.25 mg, 0.25 mg, Intravenous,  Once, 1 of 7 cycles  Administration: 0.25 mg (07/23/2017)    CARBOplatin (PARAPLATIN) 240 mg in sodium chloride 0.9 % 250 mL chemo infusion, 240 mg (100 % of original dose 238.2 mg), Intravenous,  Once, 1 of 7 cycles  Dose modification: 238.2 mg (original dose 238.2 mg, Cycle 1)  Administration: 240 mg (07/23/2017)    PACLitaxel (TAXOL) 102 mg in sodium chloride 0.9 % 250 mL chemo infusion (</= 80mg /m2), 45 mg/m2 = 102 mg, Intravenous,  Once, 1 of 7 cycles  Administration: 102 mg (07/23/2017)  for chemotherapy treatment.       REVIEW OF SYSTEMS:   Constitutional: Denies fevers, chills. Reports fatigue, anorexia, and weight loss.  Eyes: Denies blurriness of vision Ears, nose, mouth, throat, and face: Denies mucositis or  sore throat Respiratory: Reports shortness of breath. Denies hemoptysis.  Cardiovascular: Denies palpitation, chest discomfort Gastrointestinal:  Denies nausea, heartburn or change in bowel habits Skin: Denies abnormal skin rashes Lymphatics: Denies new lymphadenopathy or easy bruising Neurological:Denies numbness, tingling or new weaknesses Behavioral/Psych: Mood is stable, no new changes  Extremities: No lower extremity edema All other  systems were reviewed with the patient and are negative.  I have reviewed the past medical history, past surgical history, social history and family history with the patient and they are unchanged from previous note.   PHYSICAL EXAMINATION:  Vitals:   12/03/18 0841 12/03/18 0904  BP: (!) 142/62   Pulse: 80 (!) 59  Resp:  15  Temp:    SpO2:  97%   Filed Weights   11/30/18 0917 12/01/18 0524  Weight: 192 lb 9.6 oz (87.4 kg) 190 lb (86.2 kg)    GENERAL:alert, no distress and comfortable SKIN: skin color, texture, turgor are normal, no rashes or significant lesions EYES: normal, Conjunctiva are pink and non-injected, sclera clear OROPHARYNX:no exudate, no erythema and lips, buccal mucosa, and tongue normal  NECK: supple, thyroid normal size, non-tender, without nodularity LYMPH:  no palpable lymphadenopathy in the cervical, axillary or inguinal LUNGS: normal breathing effort. Diminished breath sounds. HEART: regular rate & rhythm and no murmurs and no lower extremity edema ABDOMEN:abdomen soft, non-tender and normal bowel sounds Musculoskeletal:no cyanosis of digits and no clubbing  NEURO: alert & oriented x 3 with fluent speech, no focal motor/sensory deficits  LABORATORY DATA:  I have reviewed the data as listed CMP Latest Ref Rng & Units 12/03/2018 12/02/2018 12/01/2018  Glucose 70 - 99 mg/dL 171(H) 117(H) 104(H)  BUN 8 - 23 mg/dL 24(H) 22 20  Creatinine 0.61 - 1.24 mg/dL 1.56(H) 1.39(H) 1.17  Sodium 135 - 145 mmol/L 135 135 138  Potassium 3.5 - 5.1 mmol/L 4.0 3.8 3.8  Chloride 98 - 111 mmol/L 102 102 103  CO2 22 - 32 mmol/L 23 24 27   Calcium 8.9 - 10.3 mg/dL 8.0(L) 8.1(L) 8.2(L)  Total Protein 6.5 - 8.1 g/dL - - -  Total Bilirubin 0.3 - 1.2 mg/dL - - -  Alkaline Phos 38 - 126 U/L - - -  AST 15 - 41 U/L - - -  ALT 0 - 44 U/L - - -    Lab Results  Component Value Date   WBC 8.2 12/03/2018   HGB 7.1 (L) 12/03/2018   HCT 23.4 (L) 12/03/2018   MCV 78.3 (L) 12/03/2018    PLT 295 12/03/2018   NEUTROABS 1.6 10/01/2017   RADIOLOGY: FINDINGS: CT CHEST FINDINGS  Cardiovascular: Heart size is mildly enlarged. There is no significant pericardial fluid, thickening or pericardial calcification. There is aortic atherosclerosis, as well as atherosclerosis of the great vessels of the mediastinum and the coronary arteries, including calcified atherosclerotic plaque in the left main, left anterior descending, left circumflex and right coronary arteries.  Mediastinum/Nodes: No pathologically enlarged mediastinal or hilar lymph nodes. Esophagus is unremarkable in appearance. No axillary lymphadenopathy.  Lungs/Pleura: Previously noted left upper lobe nodule is no longer clearly identifiable. Several fiducial markers are noted in the left upper lobe. On today's examination there is a new 7.8 x 9.5 x 8.3 cm mass-like area (axial image 21 of series 4 and coronal image 105 of series 6) which is centrally hypovascular with multiple internal locules of gas, favored to represent a large centrally necrotic mass, likely with superinfection (i.e., a large pulmonary abscess) with some adjacent postobstructive  changes in the remaining portions of the left upper lobe. This is associated with outward bowing of the left major fissure. Some consolidative changes in volume loss are noted in the anterior and lateral aspect of the left lower lobe. Moderate left pleural effusion predominantly loculated in a subpulmonic position. New right lower lobe pulmonary nodule (axial image 131 of series 5) measuring 2.8 x 1.8 cm, concerning for metastatic lesion. No right pleural effusion.  Musculoskeletal: Lytic lesion in the left side of the manubrium measuring 2.5 x 1.7 cm (axial image 13 of series 4).  CT ABDOMEN PELVIS FINDINGS  Hepatobiliary: No suspicious cystic or solid hepatic lesions. No intra or extrahepatic biliary ductal dilatation. Gallbladder is unremarkable in  appearance.  Pancreas: No pancreatic mass. No pancreatic ductal dilatation. No pancreatic or peripancreatic fluid or inflammatory changes.  Spleen: Unremarkable.  Adrenals/Urinary Tract: Multiple new right adrenal nodules and masses, compatible with metastatic disease, largest of which measures 2.8 x 3.8 cm (axial image 71 of series 4). Left adrenal gland is normal in appearance. Left kidney is normal in appearance. Multiple low-attenuation lesions in the right kidney, compatible with simple cysts measuring up to 4.2 cm in the interpolar region. In the lower pole of the right kidney there is also an exophytic intermediate attenuation lesion which is incompletely characterized, but similar to prior PET-CT 04/25/2017, favored to represent a mildly proteinaceous/hemorrhagic cyst. No hydroureteronephrosis. Urinary bladder is normal in appearance.  Stomach/Bowel: Normal appearance of the stomach. No pathologic dilatation of small bowel or colon. Numerous colonic diverticulae are noted, particularly in the proximal sigmoid colon, without surrounding inflammatory changes to suggest an acute diverticulitis at this time. Normal appendix.  Vascular/Lymphatic: Aortic atherosclerosis, without evidence of aneurysm or dissection in the abdominal or pelvic vasculature. No lymphadenopathy noted in the abdomen or pelvis.  Reproductive: Prostate gland and seminal vesicles are unremarkable in appearance.  Other: No significant volume of ascites.  No pneumoperitoneum.  Musculoskeletal: Large lytic lesion in the right side of the S1 vertebra measuring 3.2 x 4.2 cm (axial image 97 of series 4).  IMPRESSION: 1. Large centrally necrotic mass in the left upper lobe, likely to reflect treated neoplasm. Given the appearance of this lesion on today's examination, this is favored to reflect a large pulmonary abscess at this time. This is associated with a moderate loculated left pleural  effusion, largest component of which is subpulmonic in position. 2. New 2.8 x 1.8 cm right lower lobe pulmonary nodule, multiple right adrenal nodules and masses, and new lytic lesions in the left side of the manubrium and the right S1 vertebra, compatible with widespread metastatic disease. 3. Colonic diverticulosis without evidence of acute diverticulitis at this time. 4. Aortic atherosclerosis, in addition to left main and 3 vessel coronary artery disease. Please note that although the presence of coronary artery calcium documents the presence of coronary artery disease, the severity of this disease and any potential stenosis cannot be assessed on this non-gated CT examination. Assessment for potential risk factor modification, dietary therapy or pharmacologic therapy may be warranted, if clinically indicated. 5. Additional incidental findings, as above.   Electronically Signed   By: Vinnie Langton M.D.   On: 11/30/2018 12:02  ASSESSMENT AND PLAN: This is a 63 year old white male with a stage IIIa non-small cell lung cancer, squamous cell carcinoma currently undergoing a course of concurrent chemoradiation with weekly carboplatin and paclitaxel status post 6 cycles. The patient tolerated this treatment well with no concerning complaints. The patient did  not complete his recommended course of therapy and was then lost to follow-up. He is now admitted for weakness. CT scans are concerning for metastatic disease. He also has a left pleural effusion and has undergone a thoracentesis. Pleural fluid did not show evidence of malignant cells.   I talked with the patient about his diagnosis. I reminded him that he received chemo and radiation previously due to his diagnosis of lung cancer. We discussed that the recent CT shows that his cancer has metastasized. We discussed options including a referral to palliative care/hospice vs. talking with Dr. Julien Nordmann about additional treatment for his  cancer. He states that he would like to talk about treatment. I will arrange for outpatient follow-up at the cancer center in 1-2 weeks. He states that he does not have transportation. I have sent a message to our transportation coordinator to see how we can help.  Mikey Bussing, DNP, AGPCNP-BC, AOCNP

## 2018-12-03 NOTE — Consult Note (Signed)
Consultation Note Date: 12/03/2018   Patient Name: Brad [name]  DOB: [birth year]  MRN: [id number]  Age / Sex: 63 y.o., male   PCP: Biagio Borg, MD Referring Physician: Antonieta Pert, MD   REASON FOR CONSULTATION:Establishing goals of care  Palliative Care consult requested for this 63 y.o. male with multiple medical problems including chronic hepatitis C, COPD, combined systolic and diastolic congestive heart failure, CVA, Small cell carcinoma stage 3 s/p radiation and incomplete chemotherapy, hypothyroidism, type 2 diabetes, and hyperlipidemia. Since admission chest CT showed  Large centralized necrotic mass with new pulmonary nodules, multiple right adrenal nodules and masses, lytic lesions of left manubrium, and right S1 vertebra. He is s/p left thoracentesis (4/25) which yielded 1.9L  Clinical Assessment and Goals of Care: I have reviewed medical records including lab results, imaging, Epic notes, and MAR, received report from the bedside RN, and assessed the patient. I met at the bedside with Mr. Satterly to discuss diagnosis prognosis, GOC, EOL wishes, disposition and options. He is awake, alert, and oriented x 3. Denied pain or shortness of breath. Reports his breathing feels much better since thoracentesis.    I introduced Palliative Medicine as specialized medical care for people living with serious illness. It focuses on providing relief from the symptoms and stress of a serious ill ness. The goal is to improve quality of life for both the patient and the family.  We discussed a brief life review of the patient, along with his overal functional and nutritional status. Patient reports "I have lived a rough life, but it's mines". He reports he lives alone and has a lady friend. He states he has a daughter in [place], however they do not talk much. He reports he has a sister that lives in [place] who he talks with on a regular basis but mainly his support system is his friends.   Prior to  admission he states he was independent of all ADLs. He reports that his appetite was fair.   We discussed His current illness and what it means in the larger context of His on-going co-morbidities. With specific discussions regarding his widespread metastatic cancer, pleural effusions, and overall condition. Natural disease trajectory and expectations at EOL were discussed. He reports "I just wished someone would have told me before now that I had cancer". I prompted and reminded him of his previous cancer history, Oncology treatments and appointments. He verbalized awareness and states "I did not finish chemo because I wasn't going to die from the treatments and I couldn't get there". I allowed him to express his feelings regarding treatment in the past and his care. He continued to state that he was never told he had lung cancer and was just in the hospital last week. Support given.   I attempted to discuss that his condition and not finishing previous treatments may have contributed to his progression. He reports him not finishing was out of his control and he "served some time".   I attempted to elicit values and goals of care important to the patient.    The difference between aggressive medical intervention and comfort care was considered in light of the patient's goals of care. Mr. Castilla states he knows he will die from his cancer and he is not sure he would want to do any form of chemotherapy. He states his preference is to go home and be with his friends and if he dies just make sure he isn't suffering. Support given.  He states he does feel better since his thoracentesis and if coming to the hospital means he can breath he would or would rather have the pleurx placed.   He states again, "don't give me chemo, it is poison". I explained he is able to decide what he would like and if he wishes not to proceed with chemotherapy he would need to understand that his cancer could continue to progress  and he will be facing more of an end-of-life situation. He verbalized his awareness stating "I am going to die either way. Damned if you do damned if you don't"   He denies any form of advanced directives. He reports in the event of the need for a medical decision maker his friend Shauna Hugh Purdue is he designated proxy. He reports he has not spoken with his daughter in some time, but is planning to call her sometime soon and let her know what is going on and that he has cancer.   I discussed with him in detail his current full code status with consideration to his current illness and co-morbidities. He expressed that he would not want any form of heroic measures knowing he would still have the terminal cancer. He expressed if he was to go allow him to go and contact Diane. I explained based on his expressed wishes this would mean he would want to be a DNR/DNI. He verbalized understanding and confirmed again that those are his wishes. I explained to him that the RN would be placing a bracelet on his arm to identify his expressed wishes to other medical team members and a completed form would be placed in his chart to take home with him at discharge. He verbalized understanding and agreement.   Hospice and Palliative Care services outpatient were explained and offered. Patient verbalized his understanding and awareness of both palliative and hospice's goals and philosophy of care. He reports he would rather have hospice and support in case he developed symptoms at home and if as he continued to decline they would guide him and his friend during the process.   Questions and concerns were addressed.  Hard Choices booklet left for review. Patient was encouraged to call with questions or concerns.  PMT will continue to support holistically.   SOCIAL HISTORY:     reports that he has been smoking cigarettes. He has a 48.00 pack-year smoking history. He has never used smokeless tobacco. He reports previous alcohol  use of about 1.0 standard drinks of alcohol per week. He reports that he does not use drugs.  CODE STATUS: DNR  ADVANCE DIRECTIVES: Primary Decision Maker: Patient      SYMPTOM MANAGEMENT: Per attending   Palliative Prophylaxis:   Bowel Regimen and Frequent Pain Assessment  PSYCHO-SOCIAL/SPIRITUAL:  Support System: Family and friends   Desire for further Chaplaincy support:NO   Additional Recommendations (Limitations, Scope, Preferences):  Full Scope Treatment and No Chemotherapy   PAST MEDICAL HISTORY: Past Medical History:  Diagnosis Date  . Anemia    takes Ferrous Sulfated daily  . Arthritis   . Carotid stenosis 08/27/2014   takes Aspirin daily  . Chronic hepatitis C ([facility]) 09/17/2015  . COPD (chronic obstructive pulmonary disease) (H[facility])    Albuterol daily as needed and Spiriva daily  . Depression   . Diabetes mellitus type II    takes Metformin and Glipizide daily  . Dizziness   . GERD (gastroesophageal reflux disease)   . GI bleed 12/2015  . Headache    occasionally  .  History of blood clots    in right leg;being followed by Dr.Brabham for this  . History of blood transfusion 09/2014   2 units-no abnormal reaction noted  . History of gastric ulcer   . History of hiatal hernia   . Hyperlipidemia 08/27/2014   takes Simvastatin daily  . Hypertension    takes Amlodipine and Lisinopril daily  . Hypothyroidism 08/27/2014   takes Synthroid daily  . Insomnia    takes Pamelor nightly  . Joint pain    hips  . Muscle spasm    takes Zanaflex daily as needed  . MVP (mitral valve prolapse)   . Peripheral edema    takes Furosemide daily as needed  . Peripheral neuropathy   . Pleural effusion 11/2018  . Prurigo nodularis   . Stroke [facility])     PAST SURGICAL HISTORY:  Past Surgical History:  Procedure Laterality Date  . ENDARTERECTOMY Left 11/13/2014   Procedure: ENDARTERECTOMY CAROTID WITH PATCH ANGIOPLASTY;  Surgeon: Serafina Mitchell, MD;  Location: [facility] OR;  Service:  Vascular;  Laterality: Left;  . ESOPHAGOGASTRODUODENOSCOPY N/A 09/24/2014   Procedure: ESOPHAGOGASTRODUODENOSCOPY (EGD);  Surgeon: Winfield Cunas., MD;  Location: [facility] ENDOSCOPY;  Service: Endoscopy;  Laterality: N/A;  . FUDUCIAL PLACEMENT N/A 06/26/2017   Procedure: PLACEMENT OF FUDUCIAL;  Surgeon: Grace Isaac, MD;  Location: [facility];  Service: Thoracic;  Laterality: N/A;  . I&D of right arm    . INCISION AND DRAINAGE PERIRECTAL ABSCESS  03/2009  . IR THORACENTESIS ASP PLEURAL SPACE W/IMG GUIDE  11/21/2018  . Neg Stress Test  2000  . PERIPHERAL VASCULAR CATHETERIZATION N/A 11/30/2015   Procedure: Carotid PTA/Stent Intervention;  Surgeon: Serafina Mitchell, MD;  Location: [facility] CV LAB;  Service: Cardiovascular;  Laterality: N/A;  . ROTATOR CUFF REPAIR Right 2009   multiple f/u Sxs/infection Tx  . VASCULAR SURGERY    . VIDEO BRONCHOSCOPY WITH ENDOBRONCHIAL NAVIGATION N/A 05/25/2017   Procedure: VIDEO BRONCHOSCOPY WITH ENDOBRONCHIAL NAVIGATION;  Surgeon: Rigoberto Noel, MD;  Location: [facility];  Service: Thoracic;  Laterality: N/A;  . VIDEO BRONCHOSCOPY WITH ENDOBRONCHIAL NAVIGATION N/A 06/26/2017   Procedure: VIDEO BRONCHOSCOPY WITH ENDOBRONCHIAL NAVIGATION WITH PLACEMENT OF FIDUCIALS;  Surgeon: Grace Isaac, MD;  Location: [facility];  Service: Thoracic;  Laterality: N/A;  . VIDEO BRONCHOSCOPY WITH ENDOBRONCHIAL ULTRASOUND N/A 05/25/2017   Procedure: VIDEO BRONCHOSCOPY WITH ENDOBRONCHIAL ULTRASOUND;  Surgeon: Rigoberto Noel, MD;  Location: [facility];  Service: Thoracic;  Laterality: N/A;    ALLERGIES:  is allergic to doxycycline; lipitor [atorvastatin]; and oxycodone.   MEDICATIONS:  Current Facility-Administered Medications  Medication Dose Route Frequency Provider Last Rate Last Dose  . 0.9 %  sodium chloride infusion   Intravenous Continuous Antonieta Pert, MD   Stopped at 12/03/18 0840  . acetaminophen (TYLENOL) tablet 650 mg  650 mg Oral Q6H PRN Karmen Bongo, MD   650 mg at  12/03/18 1311   Or  . acetaminophen (TYLENOL) suppository 650 mg  650 mg Rectal Q6H PRN Karmen Bongo, MD      . Ampicillin-Sulbactam (UNASYN) 3 g in sodium chloride 0.9 % 100 mL IVPB  3 g Intravenous Q6H Carney, Jessica C, RPH 200 mL/hr at 12/03/18 1559 3 g at 12/03/18 1559  . aspirin EC tablet 81 mg  81 mg Oral Daily Karmen Bongo, MD   81 mg at 12/03/18 0841  . docusate sodium (COLACE) capsule 100 mg  100 mg Oral BID Karmen Bongo, MD   100 mg  at 12/03/18 0841  . enoxaparin (LOVENOX) injection 40 mg  40 mg Subcutaneous Q24H Karmen Bongo, MD   40 mg at 12/03/18 0841  . ferrous sulfate tablet 325 mg  325 mg Oral TID WC Karmen Bongo, MD   325 mg at 12/03/18 1600  . hydrALAZINE (APRESOLINE) injection 5 mg  5 mg Intravenous Q4H PRN Karmen Bongo, MD   5 mg at 12/01/18 2020  . HYDROcodone-acetaminophen (NORCO/VICODIN) 5-325 MG per tablet 1 tablet  1 tablet Oral Q6H PRN Antonieta Pert, MD   1 tablet at 12/03/18 0601  . levothyroxine (SYNTHROID) tablet 112 mcg  112 mcg Oral QAC breakfast Karmen Bongo, MD   112 mcg at 12/03/18 0601  . metoprolol succinate (TOPROL-XL) 24 hr tablet 25 mg  25 mg Oral Daily Karmen Bongo, MD   25 mg at 12/03/18 0841  . nicotine (NICODERM CQ - dosed in mg/24 hours) patch 14 mg  14 mg Transdermal Daily Karmen Bongo, MD   14 mg at 12/03/18 0845  . ondansetron (ZOFRAN) tablet 4 mg  4 mg Oral Q6H PRN Karmen Bongo, MD       Or  . ondansetron [facility]) injection 4 mg  4 mg Intravenous Q6H PRN Karmen Bongo, MD      . pantoprazole (PROTONIX) EC tablet 40 mg  40 mg Oral Ivery Quale, MD   40 mg at 12/02/18 2201  . umeclidinium-vilanterol (ANORO ELLIPTA) 62.5-25 MCG/INH 1 puff  1 puff Inhalation Daily Karmen Bongo, MD   1 puff at 12/03/18 0904    VITAL SIGNS: BP (!) 142/62   Pulse (!) 59   Temp 98 F (36.7 C) (Oral)   Resp 15   Ht 5' 11"  (1.803 m)   Wt 86.2 kg   SpO2 97%   BMI 26.50 kg/m  Filed Weights   11/30/18 0917 12/01/18 0524   Weight: 87.4 kg 86.2 kg    Estimated body mass index is 26.5 kg/m as calculated from the following:   Height as of this encounter: 5' 11"  (1.803 m).   Weight as of this encounter: 86.2 kg.  LABS: CBC:    Component Value Date/Time   WBC 8.2 12/03/2018 0337   HGB 7.1 (L) 12/03/2018 0337   HGB 10.3 (L) 08/06/2017 0856   HCT 23.4 (L) 12/03/2018 0337   HCT 32.1 (L) 08/06/2017 0856   PLT 295 12/03/2018 0337   PLT 272 08/06/2017 0856   Comprehensive Metabolic Panel:    Component Value Date/Time   NA 135 12/03/2018 0337   NA 138 08/06/2017 0856   K 4.0 12/03/2018 0337   K 3.6 08/06/2017 0856   CO2 23 12/03/2018 0337   CO2 33 (H) 08/06/2017 0856   BUN 24 (H) 12/03/2018 0337   BUN 16.0 08/06/2017 0856   CREATININE 1.56 (H) 12/03/2018 0337   CREATININE 1.3 08/06/2017 0856   ALBUMIN 1.7 (L) 11/21/2018 1420   ALBUMIN 2.2 (L) 08/06/2017 0856     Review of Systems  Constitutional: Positive for activity change and fatigue.  Neurological: Positive for weakness.  All other systems reviewed and are negative.  Physical Exam Vitals signs and nursing note reviewed.  Constitutional:      General: He is awake.     Appearance: He is underweight. He is ill-appearing.     Comments: Chronically ill appearing   Cardiovascular:     Rate and Rhythm: Normal rate and regular rhythm.     Pulses: Normal pulses.  Pulmonary:     Effort: Pulmonary  effort is normal.     Breath sounds: Decreased breath sounds present.     Comments: Shortness of breath at times  Skin:    General: Skin is warm and dry.     Findings: Bruising present.  Neurological:     Mental Status: He is alert and oriented to person, place, and time.     Motor: Weakness and atrophy present.  Psychiatric:        Attention and Perception: Attention normal.        Mood and Affect: Mood normal.        Speech: Speech normal.        Behavior: Behavior is cooperative.        Thought Content: Thought content normal.         Cognition and Memory: Cognition normal.        Judgment: Judgment normal.    Prognosis: < 6 months in the setting of large left pleural effusion s/p throacentesis 1.9L fluid, generalized weakness, chronic combined systolic and diastolic CHF, widely metastatic left lung cancer, anemia, hypothyroidism, tobacco abuse, and COPD.   Discharge Planning:  Home with Hospice  Recommendations:  DNR/DNI-as requested by patient  Continue to treat with goal of returning home with friends and family support.  Patient reports he is not interested in chemotherapy and requesting hospice support once discharged.   PMT will continue to follow and support    Palliative Performance Scale: PPS 50%                Patient expressed understanding and was in agreement with this plan.   Thank you for allowing the Palliative Medicine Team to assist in the care of this patient.  Time In: 1030 Time Out: 1145 Time Total: 75 min.   Visit consisted of counseling and education dealing with the complex and emotionally intense issues of symptom management and palliative care in the setting of serious and potentially life-threatening illness.Greater than 50%  of this time was spent counseling and coordinating care related to the above assessment and plan.  Signed by:  Alda Lea, AGPCNP-BC Palliative Medicine Team  Phone: [phone] Fax: [phone] Pager: [phone] Amion: Bjorn Pippin

## 2018-12-03 NOTE — Progress Notes (Signed)
Pharmacy Antibiotic Note  Brad [name] is a 63 y.o. male admitted on 11/29/2018 with Lung abscess s/p thoracentesis .  Pharmacy has been consulted for Unasyn dosing.  ID: Lung abscess s/p thoracentesis (exudative, suspect malignant). Chronic Hep C. Awaiting pleural fluid cultures. Widely metastatic lung cancer. -wbc wnl, afebrile, Scr 1.56 trending up.  Unasyn 4/25 >   4/25 pleural fluid cx >  4/25: Pleural fluid gram stain: negative  Plan: Unasyn 3g IV q 6 hrs. Dose ok for now.     Height: 5\' 11"  (180.3 cm) Weight: (refuse weight. stated not getting up right now) IBW/kg (Calculated) : 75.3  Temp (24hrs), Avg:97.9 F (36.6 C), Min:97.8 F (36.6 C), Max:98 F (36.7 C)  Recent Labs  Lab 11/27/18 0354 11/29/18 1955 12/01/18 0306 12/02/18 0420 12/03/18 0337  WBC  --  7.8 6.7 9.5 8.2  CREATININE 1.46* 1.72* 1.17 1.39* 1.56*    Estimated Creatinine Clearance: 53 mL/min (A) (by C-G formula based on SCr of 1.56 mg/dL (H)).    Allergies  Allergen Reactions  . Doxycycline Hives and Itching  . Lipitor [Atorvastatin] Other (See Comments)    Myalgia   . Oxycodone Itching    Brad [name], PharmD, BCPS Clinical Staff Pharmacist Brad [name] 12/03/2018 1:53 PM

## 2018-12-04 ENCOUNTER — Other Ambulatory Visit: Payer: Self-pay | Admitting: *Deleted

## 2018-12-04 ENCOUNTER — Telehealth: Payer: Self-pay | Admitting: Internal Medicine

## 2018-12-04 DIAGNOSIS — E039 Hypothyroidism, unspecified: Secondary | ICD-10-CM

## 2018-12-04 DIAGNOSIS — I10 Essential (primary) hypertension: Secondary | ICD-10-CM

## 2018-12-04 DIAGNOSIS — Z720 Tobacco use: Secondary | ICD-10-CM

## 2018-12-04 DIAGNOSIS — E785 Hyperlipidemia, unspecified: Secondary | ICD-10-CM

## 2018-12-04 DIAGNOSIS — J90 Pleural effusion, not elsewhere classified: Secondary | ICD-10-CM

## 2018-12-04 LAB — CBC
HCT: 20.6 % — ABNORMAL LOW (ref 39.0–52.0)
Hemoglobin: 6.1 g/dL — CL (ref 13.0–17.0)
MCH: 23 pg — ABNORMAL LOW (ref 26.0–34.0)
MCHC: 29.6 g/dL — ABNORMAL LOW (ref 30.0–36.0)
MCV: 77.7 fL — ABNORMAL LOW (ref 80.0–100.0)
Platelets: 262 10*3/uL (ref 150–400)
RBC: 2.65 MIL/uL — ABNORMAL LOW (ref 4.22–5.81)
RDW: 16.7 % — ABNORMAL HIGH (ref 11.5–15.5)
WBC: 6.1 10*3/uL (ref 4.0–10.5)
nRBC: 0 % (ref 0.0–0.2)

## 2018-12-04 LAB — BASIC METABOLIC PANEL
Anion gap: 10 (ref 5–15)
BUN: 25 mg/dL — ABNORMAL HIGH (ref 8–23)
CO2: 23 mmol/L (ref 22–32)
Calcium: 8 mg/dL — ABNORMAL LOW (ref 8.9–10.3)
Chloride: 103 mmol/L (ref 98–111)
Creatinine, Ser: 1.3 mg/dL — ABNORMAL HIGH (ref 0.61–1.24)
GFR calc Af Amer: >60 mL/min (ref >60)
GFR calc non Af Amer: 59 mL/min — ABNORMAL LOW (ref >60)
Glucose, Bld: 105 mg/dL — ABNORMAL HIGH (ref 70–99)
Potassium: 4.2 mmol/L (ref 3.5–5.1)
Sodium: 136 mmol/L (ref 135–145)

## 2018-12-04 LAB — GLUCOSE, CAPILLARY
Glucose-Capillary: 87 mg/dL (ref 70–99)
Glucose-Capillary: 128 mg/dL — ABNORMAL HIGH (ref 70–99)
Glucose-Capillary: 115 mg/dL — ABNORMAL HIGH (ref 70–99)

## 2018-12-04 LAB — PREPARE RBC (CROSSMATCH)

## 2018-12-04 MED ORDER — SODIUM CHLORIDE 0.9% IV SOLUTION
Freq: Once | INTRAVENOUS | Status: AC
  Administered 2018-12-07: 10:00:00 via INTRAVENOUS

## 2018-12-04 NOTE — Progress Notes (Signed)
PT Cancellation Note  Patient Details Name: Brad [name] MRN: [id number] DOB: 08/16/1955   Cancelled Treatment:    Reason Eval/Treat Not Completed: (P) Medical issues which prohibited therapy Pt with Hgb of 6.1. PT will follow back this afternoon to determine appropriateness of therapy.   Jaquane Boughner B. Migdalia Dk PT, DPT Acute Rehabilitation Services Pager [phone] Office [phone]    [place] 12/04/2018, 8:39 AM

## 2018-12-04 NOTE — Progress Notes (Signed)
CRITICAL VALUE ALERT  Critical Value:  Hemoglobin 6.1  Date & Time Notied:  12/04/2018 0402  Provider Notified: Tylene Fantasia  Orders Received/Actions taken: per protocol, awaiting.

## 2018-12-04 NOTE — Progress Notes (Signed)
Daily Progress Note   Patient Name: Brad [name]       Date: 12/04/2018 DOB: [birth year]  Age: 63 y.o. MRN#: [id number] Attending Physician: Marcell Anger* Primary Care Physician: Biagio Borg, MD Admit Date: 11/29/2018  Reason for Consultation/Follow-up: Establishing goals of care  Subjective: Patient awake, alert, and oriented lying in bed. Receiving blood transfusion. States he feels tired. Denied pain or shortness of breath.   We briefly reviewed our [place] discussion on yesterday. [name] states he has had some time to think about everything and is willing to at least try chemotherapy and see how it makes him feel. Support given. He states hopefully with transportation he can get there. He is aware that transportation is being set-up by cancer center to support him and his visits per my conversation with Erasmo Downer, NP. He verbalized understanding and appreciation.   I explained to him at this time given wishes to proceed with potential chemotherapy he would not qualify for hospice however, I would suggest he is supported by Palliative. I discussed him seeing my colleague Wadie Lessen, NP at the [facility] versus palliative in the home. He stated if it could be coordinated with his other appointments he would rather see someone at the cancer center. Support given and he is aware I will discuss with Stanton Kidney, NP. I educated him that at any point he finishes or discontinues chemotherapy treatments he may transition to hospice if he wishes. He may speak to his outpatient medical team at that time and they can assist. He verbalized understanding and appreciation.   I reviewed his wishes for DNR/DNI on yesterday. Patient expresses he would like to continue with DNR/DNI, just now wishes to  continue to treat with hopes of allowing more time with friends.   He explained he is hopeful for some home health assistance if he returns home.   Length of Stay: 3  Current Medications: Scheduled Meds:  . sodium chloride   Intravenous Once  . aspirin EC  81 mg Oral Daily  . docusate sodium  100 mg Oral BID  . enoxaparin (LOVENOX) injection  40 mg Subcutaneous Q24H  . ferrous sulfate  325 mg Oral TID WC  . levothyroxine  112 mcg Oral QAC breakfast  . metoprolol succinate  25 mg Oral Daily  . nicotine  14 mg Transdermal Daily  . pantoprazole  40 mg Oral QHS  . umeclidinium-vilanterol  1 puff Inhalation Daily    Continuous Infusions: . ampicillin-sulbactam (UNASYN) IV 3 g (12/04/18 1325)    PRN Meds: acetaminophen **OR** acetaminophen, hydrALAZINE, HYDROcodone-acetaminophen, ondansetron **OR** ondansetron (ZOFRAN) IV  Physical Exam Vitals signs and nursing note reviewed.  Constitutional:      General: He is awake.     Appearance: He is ill-appearing.  Cardiovascular:     Rate and Rhythm: Normal rate and regular rhythm.     Pulses: Normal pulses.     Heart sounds: Normal heart sounds.  Pulmonary:     Effort: Pulmonary effort is normal.     Breath sounds: Decreased breath sounds present.  Neurological:     Mental Status: He is alert.  Psychiatric:        Behavior: Behavior is cooperative.       Vital Signs: BP (!) 169/64 (BP Location: Right Arm)   Pulse 66   Temp 98.6 F (37 C) (Oral)   Resp 20   Ht 5\' 11"  (1.803 m)   Wt 90 kg   SpO2 98%   BMI 27.69 kg/m  SpO2: SpO2: 98 % O2 Device: O2 Device: Room Air O2 Flow Rate: O2 Flow Rate (L/min): 3 L/min  Intake/output summary:   Intake/Output Summary (Last 24 hours) at 12/04/2018 1338 Last data filed at 12/04/2018 1217 Gross per 24 hour  Intake 2264.99 ml  Output 3000 ml  Net -735.01 ml   LBM: Last BM Date: 12/04/18 Baseline Weight: Weight: 87.4 kg Most recent weight: Weight: 90 kg       Palliative  Assessment/Data: PPS 50%   Flowsheet Rows     Most Recent Value  Intake Tab  Referral Department  Hospitalist  Unit at Time of Referral  Cardiac/Telemetry Unit  Date Notified  12/02/18  Palliative Care Type  New Palliative care  Reason for referral  Clarify Goals of Care  Date of Admission  11/29/18  # of days IP prior to Palliative referral  3  Clinical Assessment  Psychosocial & Spiritual Assessment  Palliative Care Outcomes      Patient Active Problem List   Diagnosis Date Noted  . Weakness 11/30/2018  . Chronic combined systolic and diastolic CHF (congestive heart failure) ([place]) 11/30/2018  . AKI (acute kidney injury) ([place]) 11/26/2018  . Acute exacerbation of CHF (congestive heart failure) ([place]) 11/21/2018  . Encounter for antineoplastic chemotherapy 08/27/2017  . Stage III squamous cell carcinoma of left lung ([place]) 06/29/2017  . Mediastinal lymphadenopathy   . Pleural effusion   . Lung mass 04/27/2017  . Abnormal chest x-ray 04/13/2017  . Loss of weight 05/10/2016  . Grief reaction 05/10/2016  . Preop exam for internal medicine 05/10/2016  . Liver fibrosis 03/09/2016  . Smoker 01/23/2016  . Peripheral vascular disease ([place]) 01/19/2016  . GI bleed 12/08/2015  . Acute on chronic anemia 12/08/2015  . Diastolic CHF ([place]) 02/54/2706  . Hypersomnolence 12/07/2015  . Carotid stenosis, symptomatic, with infarction ([place]) 11/30/2015  . HLD (hyperlipidemia) 11/12/2015  . S/P carotid endarterectomy 11/12/2015  . Secondary cardiomyopathy ([place]) 10/14/2015  . Acute CVA (cerebrovascular accident) ([place]) 09/28/2015  . Stroke (cerebrum) ([place]) 09/27/2015  . Facial droop 09/27/2015  . Chronic hepatitis C ([place]) 09/17/2015  . Skin lesion 09/14/2015  . Insect bite, infected 09/14/2015  . Left shoulder pain 06/08/2015  . Hypoalbuminemia 02/17/2015  . Elevated alkaline phosphatase level 02/17/2015  . Essential hypertension 02/16/2015  .  Carotid stenosis 11/13/2014  .  Atherosclerotic peripheral vascular disease with intermittent claudication ([place])   . Duodenal ulcer disease   . Anemia   . Tobacco abuse 09/23/2014  . Hypothyroidism 08/27/2014  . Bilateral carotid artery stenosis   . Diabetes ([place]) 05/25/2014  . Hypertensive heart disease   . History of CVA (cerebrovascular accident) without residual deficits   . Obesity (BMI 30-39.9) 01/19/2014  . Erectile dysfunction 10/10/2013  . Chronic LBP   . COPD (chronic obstructive pulmonary disease) ([place]) 03/17/2010  . GERD 03/17/2010    Palliative Care Assessment & Plan   Patient Profile: Palliative Care consult requested for this 63 y.o. male with multiple medical problems including chronic hepatitis C, COPD, combined systolic and diastolic congestive heart failure, CVA, Small cell carcinoma stage 3 s/p radiation and incomplete chemotherapy, hypothyroidism, type 2 diabetes, and hyperlipidemia. Since admission chest CT showed  Large centralized necrotic mass with new pulmonary nodules, multiple right adrenal nodules and masses, lytic lesions of left manubrium, and right S1 vertebra. He is s/p left thoracentesis (4/25) which yielded 1.9L   Recommendations/Plan:  DNR/DNI  Continue to treat  Patient wishes to proceed with chemotherapy if offered. Remains hopeful but also prepared for the worst.   Will update CM orders for palliative versus hospice given he wishes to proceed with aggressive interventions. Will discuss with Stanton Kidney, NP patient may reach out to her and Oncology team if he would like to plan a visit with her.   PMT will continue to support and follow.   Goals of Care and Additional Recommendations:  Limitations on Scope of Treatment: Full Scope Treatment  Code Status:    Code Status Orders  (From admission, onward)         Start     Ordered   12/03/18 1231  Do not attempt resuscitation (DNR)  Continuous    Question Answer Comment  In the event of cardiac or respiratory ARREST Do  not call a "code blue"   In the event of cardiac or respiratory ARREST Do not perform Intubation, CPR, defibrillation or ACLS   In the event of cardiac or respiratory ARREST Use medication by any route, position, wound care, and other measures to relive pain and suffering. May use oxygen, suction and manual treatment of airway obstruction as needed for comfort.      12/03/18 1231        Code Status History    Date Active Date Inactive Code Status Order ID Comments User Context   11/30/2018 0818 12/03/2018 1231 Full Code [id number]  Karmen Bongo, MD ED   11/20/2018 2224 11/27/2018 1517 Full Code [id number]  Lenore Cordia, MD ED   12/08/2015 1011 12/09/2015 1546 Full Code [id number]  Rondel Jumbo, PA-C ED   09/27/2015 1824 09/29/2015 2201 Full Code [id number]  Charlynne Cousins, MD Inpatient   11/13/2014 1245 11/14/2014 1138 Full Code [id number]  Serafina Mitchell, MD Inpatient   09/23/2014 1223 09/25/2014 1557 Full Code [id number]  Kelvin Cellar, MD ED   02/28/2014 1725 03/01/2014 1908 Full Code [id number]  Modena Jansky, MD Inpatient      Prognosis:   Poor   Discharge Planning:  Home with Palliative Services and home health at patient's request.   Care plan was discussed with patient and Erasmo Downer, NP.   Thank you for allowing the Palliative Medicine Team to assist in the care of this patient.  Total Time: 45 min.  Greater than 50%  of this time was spent counseling  and coordinating care related to the above assessment and plan   Alda Lea, AGPCNP-BC Palliative Medicine Team  Pager: [phone] Amion: Bjorn Pippin   Please contact Palliative Medicine Team phone at [phone] for questions and concerns.

## 2018-12-04 NOTE — TOC Progression Note (Signed)
Transition of Care [facility]) - Progression Note    Patient Details  Name: Brad [name] MRN: [id number] Date of Birth: 1955-08-09  [facility] CM/SW Contact  Graves-Bigelow, Ocie Cornfield, RN Phone Number: 12/04/2018, 1:16 PM  Clinical Narrative:   CM received consult for Hospice outpatient. CM did speak with patient and he is declining Hospice Services at this time. Patient wanted CM to look into [facility]. Pt is home alone on house arrest just released from [facility]. Patient is a registered sex offender. CM did reach out to several [place] and pt has been denied due to safety of staff. CM did reach out to Assistant Director Nathaniel Man to be of assistance in transition of care plan. CM will continue to monitor.    Expected Discharge Plan: Skilled Nursing Facility Barriers to Discharge: Unsafe home situation, Other (comment)(pt history of incarceration- SNF may not offer bed; may need palliative consult)  Expected Discharge Plan and Services Expected Discharge Plan: [place] arrangements for the past 2 months: [place], Single Family Home                    Social Determinants of Health (SDOH) Interventions    Readmission Risk Interventions Readmission Risk Prevention Plan 12/04/2018  Transportation Screening Complete  HRI or Home Care Consult Complete  Palliative Care Screening Complete  Medication Review (RN Care Manager) Complete  Some recent data might be hidden

## 2018-12-04 NOTE — Progress Notes (Signed)
PT Cancellation Note  Patient Details Name: Brad [name] MRN: [id number] DOB: [birth year]   Cancelled Treatment:    Reason Eval/Treat Not Completed: (P) Medical issues which prohibited therapy Pt has received transfusion however labs have yet to be completed. Hgb was 6.1 this morning. PT will follow back tomorrow for treatment.  Dario Yono B. Migdalia Dk PT, DPT Acute Rehabilitation Services Pager [phone] Office [phone]    [place] 12/04/2018, 4:55 PM

## 2018-12-04 NOTE — Progress Notes (Signed)
OT Cancellation Note  Patient Details Name: Brad [name] MRN: [id number] DOB: Oct 16, 1955   Cancelled Treatment:    Reason Eval/Treat Not Completed: Medical issues which prohibited therapy. Pt with Hgb of 6.1.  Will follow and see as appropriate and able.  Delight Stare, OT Acute Rehabilitation Services Pager [phone] Office [phone]    Delight Stare 12/04/2018, 3:37 PM

## 2018-12-04 NOTE — Progress Notes (Signed)
PROGRESS NOTE    Brad [name]  ERD:[id number] DOB: October 20, 1955 DOA: 11/29/2018 PCP: Biagio Borg, MD   Brief Narrative:  63 y.o.malewith medical history significant ofCVA; stage 3 SCC of the LUL s/p hemoradiation and incomplete chemotherapy; chronic combined CHF (EF 45-50% with grade 2 DD in 2/17); hypothyoiridsm; HTN; HLD; DM; chronic hep C; and COPD presenting with weakness. Since hospital discharge, he was placed on his front porch and he wasn't able to move. He was unable to get up and go to the bathroom. His legs are just too weak/ Denies SOB. No cough. Regarding his cancer, was last seen by oncology in February 2019 then lost to follow-up.   He was admitted from 4/15-22 from jail (incarcerated since 5/63/14[facility] uncertain reason; prior conviction on 12/02/12 for taking indecent liberties with a child) with pleural effusion, negative for malignancy, thought to be due to CHF exacerbation. He was diuresed 18L during the hospitalization. He has h/o GI bleeding and anemia of chronic disease and his Hgb was as low as 6.6, requiring transfusion of 1 unit PRBC.  ED Course:Recently discharged to home from jail. Chronically elevated troponin, stable. Not clearly in CHF. No hypoxia. Had thoracentesis during prior hospitalization, now opacified. Too weak to go home, tachypnea with reaccummulation of fluid after last hospitalization  4/25 s/p left thoracentesis with 1.9 L fluid and fluid removed Patient has generalized pain, weakness, PT recs SNF   Assessment & Plan:   Principal Problem:   Weakness Active Problems:   Diabetes ([place])   Hypothyroidism   Tobacco abuse   Essential hypertension   HLD (hyperlipidemia)   Pleural effusion   Stage III squamous cell carcinoma of left lung (HCC)   AKI (acute kidney injury) ([place])   Chronic combined systolic and diastolic CHF (congestive heart failure) (HCC)   Widely metastatic left lung cancer, as previously noted, CT scan 4/25  shows cancer worsening,"Large centrally necrotic mass in the left upper lobe, likely toreflect treated neoplasm. Given the appearance of this lesion on today's examination, this is favored to reflect a large pulmonary abscess at this time. This is associated with a moderate loculated left pleural effusion, largest component of which is subpulmonic in position and also with new 2.8 x 1.8 cm  RLL pulm nodule, multiple right adrenal nodules and masses, and new lytic lesions in the left side of the manubrium and the right S1 vertebra" .  Patient was lost to follow-up was on concurrent chemoradiation in 2019 February.  Oncology has been consulted and patient has agreed for follow-up as an outpatient in 1 to 2 weeks.  Oncology is helping assist with transportation for that follow-up.  Palliative care met with the patient and he indicated he would not want to do chemotherapy also so it is unclear if a follow-up appoint will have much significance in terms of his outcome. Continue on Unasyn now  Large loculated left pleural effusion status post thoracentesis 1.9 L fluid removal, suspect malignant effusion. Gram stain culture no growth, fluid appears exudative.  Cytology negative for malignant cells. He is afebrile , blood cultures no growth so far. On unasyn for now -see above.  Seen by CT surgery advised intermittent thoracentesis, Pleurx catheter for comfort if recurrent, will check chest x-ray in the morning  Generalized Weakness: Multifactorial in the setting of metastatic cancer, anemia.  PT OT evaluation appreciated, patient appears to be resistant to skilled nursing facility, there is difficulty getting home health agencies because of his sex offender status, discussed  with treatment team who are evaluating additional options.  Chronic combined systolic and diastolic CHF: TTE lvef 42-35%, severe concentric LVH, severe LAE moderate RA and concern for infiltrative process on recent echo. compensated.   Patient overdiuresied on recent admission.  Continue gentle IV fluids for AKI. Holding diuretics for now  COPD,stable continue bronchodilators.  Acute anemia of chronic disease/malignancy with hemoglobin 6.1 g. Continue iron supplementation, transfuse for hemoglobin less than 7.  With 1 unit ordered for today  Prediabetes: Hha1c 6.2 stable. Controlled.  Monitor CBG  Hypothyroidism on levothyroxine normal dysregulation  Tobacco abuse: Cessation advised  Essential hypertension: Stable on metoprolol  AKI : creat slightly up trending. Continue gentle IV fluids, recheck labs in the morning  History of CVA/PVD/Carotid endarterectomy, on aspirin  History of duodenal ulcer- cont ppi  History of chronic hep C.  No acute intervention currently monitor LFTs as needed  Recently discharged from jail.  Has a monitoring device on 1 of his legs  Noncompliance/poor follow-up: Patient has agreed to follow-up with oncology although with his lack of interest in doing chemotherapy this will be of questionable benefit   DVT prophylaxis: Lovenox SQ  Code Status: DNR    Code Status Orders  (From admission, onward)         Start     Ordered   12/03/18 1231  Do not attempt resuscitation (DNR)  Continuous    Question Answer Comment  In the event of cardiac or respiratory ARREST Do not call a code blue   In the event of cardiac or respiratory ARREST Do not perform Intubation, CPR, defibrillation or ACLS   In the event of cardiac or respiratory ARREST Use medication by any route, position, wound care, and other measures to relive pain and suffering. May use oxygen, suction and manual treatment of airway obstruction as needed for comfort.      12/03/18 1231        Code Status History    Date Active Date Inactive Code Status Order ID Comments User Context   11/30/2018 0818 12/03/2018 1231 Full Code [id number]  Karmen Bongo, MD ED   11/20/2018 2224 11/27/2018 1517 Full Code [id number]  Lenore Cordia, MD ED   12/08/2015 1011 12/09/2015 1546 Full Code [id number]  Rondel Jumbo, PA-C ED   09/27/2015 1824 09/29/2015 2201 Full Code [id number]  Charlynne Cousins, MD Inpatient   11/13/2014 1245 11/14/2014 1138 Full Code [id number]  Serafina Mitchell, MD Inpatient   09/23/2014 1223 09/25/2014 1557 Full Code [id number]  Kelvin Cellar, MD ED   02/28/2014 1725 03/01/2014 1908 Full Code [id number]  Modena Jansky, MD Inpatient     Family Communication: None present Disposition Plan:   Skilled nursing facility versus home with home health if able to locate an agency.  Patient will need to remain in the hospital inpatient for continued IV antibiotics, as well as transfusion of packed red blood cells.  He risks hemodynamic collapse without these treatments. Consults called: Palliative care, oncology, Admission status: Inpatient   Consultants:   As above  Procedures:  Dg Chest 1 View  Result Date: 11/30/2018 CLINICAL DATA:  Status post left thoracentesis. EXAM: CHEST  1 VIEW COMPARISON:  November 30, 2018 FINDINGS: A significant left-sided pleural effusion remains. No pneumothorax after thoracentesis. The right lung is clear. IMPRESSION: Significant effusion and underlying opacity remains on the left. However, no pneumothorax is seen after left-sided thoracentesis. Electronically Signed   By: Dorise Bullion III M.D   On:  11/30/2018 11:28   Dg Chest 1 View  Result Date: 11/21/2018 CLINICAL DATA:  Follow-up thoracentesis EXAM: CHEST  1 VIEW COMPARISON:  11/20/2018 FINDINGS: Right chest remains clear. Opacification of the left lung appears progressive. The amount of pleural density appears similar. No evidence of pneumothorax. IMPRESSION: Similar appearance of pleural density on the left. Worsened opacity of the left lung. Electronically Signed   By: Nelson Chimes M.D.   On: 11/21/2018 12:31   Dg Chest 2 View  Result Date: 11/30/2018 CLINICAL DATA:  Shortness of breath EXAM: CHEST - 2 VIEW COMPARISON:   11/21/2018 FINDINGS: Continued near complete opacification of the left hemithorax with left pleural density and airspace disease. Right lung clear. There is cardiomegaly. No acute bony abnormality. IMPRESSION: Continued near complete opacification of the left hemithorax with diffuse left lung airspace disease and left pleural density, presumably effusion. Electronically Signed   By: Rolm Baptise M.D.   On: 11/30/2018 02:27   Dg Chest 2 View  Result Date: 11/20/2018 CLINICAL DATA:  Chest pain EXAM: CHEST - 2 VIEW COMPARISON:  06/26/2017 FINDINGS: Large left pleural effusion with basilar and apical components. Approximately 50% volume loss within the left lung. Left chest surgical clips. Right lung is clear. IMPRESSION: Large left pleural effusion with basilar and apical components with moderate left lung volume loss. Electronically Signed   By: Ulyses Jarred M.D.   On: 11/20/2018 17:51   Ct Chest W Contrast  Result Date: 11/30/2018 CLINICAL DATA:  63 year old male with history of non-small cell lung cancer. Staging examination. EXAM: CT CHEST, ABDOMEN, AND PELVIS WITH CONTRAST TECHNIQUE: Multidetector CT imaging of the chest, abdomen and pelvis was performed following the standard protocol during bolus administration of intravenous contrast. CONTRAST:  65m OMNIPAQUE IOHEXOL 300 MG/ML  SOLN COMPARISON:  PET-CT 04/25/2017. FINDINGS: CT CHEST FINDINGS Cardiovascular: Heart size is mildly enlarged. There is no significant pericardial fluid, thickening or pericardial calcification. There is aortic atherosclerosis, as well as atherosclerosis of the great vessels of the mediastinum and the coronary arteries, including calcified atherosclerotic plaque in the left main, left anterior descending, left circumflex and right coronary arteries. Mediastinum/Nodes: No pathologically enlarged mediastinal or hilar lymph nodes. Esophagus is unremarkable in appearance. No axillary lymphadenopathy. Lungs/Pleura: Previously noted  left upper lobe nodule is no longer clearly identifiable. Several fiducial markers are noted in the left upper lobe. On today's examination there is a new 7.8 x 9.5 x 8.3 cm mass-like area (axial image 21 of series 4 and coronal image 105 of series 6) which is centrally hypovascular with multiple internal locules of gas, favored to represent a large centrally necrotic mass, likely with superinfection (i.e., a large pulmonary abscess) with some adjacent postobstructive changes in the remaining portions of the left upper lobe. This is associated with outward bowing of the left major fissure. Some consolidative changes in volume loss are noted in the anterior and lateral aspect of the left lower lobe. Moderate left pleural effusion predominantly loculated in a subpulmonic position. New right lower lobe pulmonary nodule (axial image 131 of series 5) measuring 2.8 x 1.8 cm, concerning for metastatic lesion. No right pleural effusion. Musculoskeletal: Lytic lesion in the left side of the manubrium measuring 2.5 x 1.7 cm (axial image 13 of series 4). CT ABDOMEN PELVIS FINDINGS Hepatobiliary: No suspicious cystic or solid hepatic lesions. No intra or extrahepatic biliary ductal dilatation. Gallbladder is unremarkable in appearance. Pancreas: No pancreatic mass. No pancreatic ductal dilatation. No pancreatic or peripancreatic fluid or inflammatory changes. Spleen:  Unremarkable. Adrenals/Urinary Tract: Multiple new right adrenal nodules and masses, compatible with metastatic disease, largest of which measures 2.8 x 3.8 cm (axial image 71 of series 4). Left adrenal gland is normal in appearance. Left kidney is normal in appearance. Multiple low-attenuation lesions in the right kidney, compatible with simple cysts measuring up to 4.2 cm in the interpolar region. In the lower pole of the right kidney there is also an exophytic intermediate attenuation lesion which is incompletely characterized, but similar to prior PET-CT  04/25/2017, favored to represent a mildly proteinaceous/hemorrhagic cyst. No hydroureteronephrosis. Urinary bladder is normal in appearance. Stomach/Bowel: Normal appearance of the stomach. No pathologic dilatation of small bowel or colon. Numerous colonic diverticulae are noted, particularly in the proximal sigmoid colon, without surrounding inflammatory changes to suggest an acute diverticulitis at this time. Normal appendix. Vascular/Lymphatic: Aortic atherosclerosis, without evidence of aneurysm or dissection in the abdominal or pelvic vasculature. No lymphadenopathy noted in the abdomen or pelvis. Reproductive: Prostate gland and seminal vesicles are unremarkable in appearance. Other: No significant volume of ascites.  No pneumoperitoneum. Musculoskeletal: Large lytic lesion in the right side of the S1 vertebra measuring 3.2 x 4.2 cm (axial image 97 of series 4). IMPRESSION: 1. Large centrally necrotic mass in the left upper lobe, likely to reflect treated neoplasm. Given the appearance of this lesion on today's examination, this is favored to reflect a large pulmonary abscess at this time. This is associated with a moderate loculated left pleural effusion, largest component of which is subpulmonic in position. 2. New 2.8 x 1.8 cm right lower lobe pulmonary nodule, multiple right adrenal nodules and masses, and new lytic lesions in the left side of the manubrium and the right S1 vertebra, compatible with widespread metastatic disease. 3. Colonic diverticulosis without evidence of acute diverticulitis at this time. 4. Aortic atherosclerosis, in addition to left main and 3 vessel coronary artery disease. Please note that although the presence of coronary artery calcium documents the presence of coronary artery disease, the severity of this disease and any potential stenosis cannot be assessed on this non-gated CT examination. Assessment for potential risk factor modification, dietary therapy or pharmacologic  therapy may be warranted, if clinically indicated. 5. Additional incidental findings, as above. Electronically Signed   By: Vinnie Langton M.D.   On: 11/30/2018 12:02   Ct Abdomen Pelvis W Contrast  Result Date: 11/30/2018 CLINICAL DATA:  63 year old male with history of non-small cell lung cancer. Staging examination. EXAM: CT CHEST, ABDOMEN, AND PELVIS WITH CONTRAST TECHNIQUE: Multidetector CT imaging of the chest, abdomen and pelvis was performed following the standard protocol during bolus administration of intravenous contrast. CONTRAST:  74m OMNIPAQUE IOHEXOL 300 MG/ML  SOLN COMPARISON:  PET-CT 04/25/2017. FINDINGS: CT CHEST FINDINGS Cardiovascular: Heart size is mildly enlarged. There is no significant pericardial fluid, thickening or pericardial calcification. There is aortic atherosclerosis, as well as atherosclerosis of the great vessels of the mediastinum and the coronary arteries, including calcified atherosclerotic plaque in the left main, left anterior descending, left circumflex and right coronary arteries. Mediastinum/Nodes: No pathologically enlarged mediastinal or hilar lymph nodes. Esophagus is unremarkable in appearance. No axillary lymphadenopathy. Lungs/Pleura: Previously noted left upper lobe nodule is no longer clearly identifiable. Several fiducial markers are noted in the left upper lobe. On today's examination there is a new 7.8 x 9.5 x 8.3 cm mass-like area (axial image 21 of series 4 and coronal image 105 of series 6) which is centrally hypovascular with multiple internal locules of gas, favored to  represent a large centrally necrotic mass, likely with superinfection (i.e., a large pulmonary abscess) with some adjacent postobstructive changes in the remaining portions of the left upper lobe. This is associated with outward bowing of the left major fissure. Some consolidative changes in volume loss are noted in the anterior and lateral aspect of the left lower lobe. Moderate left  pleural effusion predominantly loculated in a subpulmonic position. New right lower lobe pulmonary nodule (axial image 131 of series 5) measuring 2.8 x 1.8 cm, concerning for metastatic lesion. No right pleural effusion. Musculoskeletal: Lytic lesion in the left side of the manubrium measuring 2.5 x 1.7 cm (axial image 13 of series 4). CT ABDOMEN PELVIS FINDINGS Hepatobiliary: No suspicious cystic or solid hepatic lesions. No intra or extrahepatic biliary ductal dilatation. Gallbladder is unremarkable in appearance. Pancreas: No pancreatic mass. No pancreatic ductal dilatation. No pancreatic or peripancreatic fluid or inflammatory changes. Spleen: Unremarkable. Adrenals/Urinary Tract: Multiple new right adrenal nodules and masses, compatible with metastatic disease, largest of which measures 2.8 x 3.8 cm (axial image 71 of series 4). Left adrenal gland is normal in appearance. Left kidney is normal in appearance. Multiple low-attenuation lesions in the right kidney, compatible with simple cysts measuring up to 4.2 cm in the interpolar region. In the lower pole of the right kidney there is also an exophytic intermediate attenuation lesion which is incompletely characterized, but similar to prior PET-CT 04/25/2017, favored to represent a mildly proteinaceous/hemorrhagic cyst. No hydroureteronephrosis. Urinary bladder is normal in appearance. Stomach/Bowel: Normal appearance of the stomach. No pathologic dilatation of small bowel or colon. Numerous colonic diverticulae are noted, particularly in the proximal sigmoid colon, without surrounding inflammatory changes to suggest an acute diverticulitis at this time. Normal appendix. Vascular/Lymphatic: Aortic atherosclerosis, without evidence of aneurysm or dissection in the abdominal or pelvic vasculature. No lymphadenopathy noted in the abdomen or pelvis. Reproductive: Prostate gland and seminal vesicles are unremarkable in appearance. Other: No significant volume of  ascites.  No pneumoperitoneum. Musculoskeletal: Large lytic lesion in the right side of the S1 vertebra measuring 3.2 x 4.2 cm (axial image 97 of series 4). IMPRESSION: 1. Large centrally necrotic mass in the left upper lobe, likely to reflect treated neoplasm. Given the appearance of this lesion on today's examination, this is favored to reflect a large pulmonary abscess at this time. This is associated with a moderate loculated left pleural effusion, largest component of which is subpulmonic in position. 2. New 2.8 x 1.8 cm right lower lobe pulmonary nodule, multiple right adrenal nodules and masses, and new lytic lesions in the left side of the manubrium and the right S1 vertebra, compatible with widespread metastatic disease. 3. Colonic diverticulosis without evidence of acute diverticulitis at this time. 4. Aortic atherosclerosis, in addition to left main and 3 vessel coronary artery disease. Please note that although the presence of coronary artery calcium documents the presence of coronary artery disease, the severity of this disease and any potential stenosis cannot be assessed on this non-gated CT examination. Assessment for potential risk factor modification, dietary therapy or pharmacologic therapy may be warranted, if clinically indicated. 5. Additional incidental findings, as above. Electronically Signed   By: Vinnie Langton M.D.   On: 11/30/2018 12:02   Nm Scan Tumor Localize With Spect  Result Date: 11/25/2018 CLINICAL DATA:  HEART FAILURE. CONCERN FOR CARDIAC AMYLOIDOSIS. EXAM: NUCLEAR MEDICINE TUMOR LOCALIZATION. PYP CARDIAC AMYLOIDOSIS SCAN WITH SPECT TECHNIQUE: Following intravenous administration of radiopharmaceutical, anterior planar images of the chest were obtained. Regions of  interest were placed on the heart and contralateral chest wall for quantitative assessment. Additional SPECT imaging of the chest was obtained. RADIOPHARMACEUTICALS:  21.7 mCi TECHNETIUM 99 PYROPHOSPHATE FINDINGS:  Planar Visual assessment: Anterior planar imaging demonstrates radiotracer uptake within the heart less than than uptake within the adjacent ribs (Grade 1). Quantitative assessment : Quantitative assessment of the cardiac uptake compared to the contralateral chest wall is equal to 1.0 (H/CL = 1.0). SPECT assessment: SPECT imaging of the chest demonstrates minimal radiotracer accumulation within the LEFT ventricle. IMPRESSION: Visual and quantitative assessment (grade 1, H/CLL equal 1.0) are NOT suggestive of transthyretin amyloidosis. Electronically Signed   By: Suzy Bouchard M.D.   On: 11/25/2018 14:33   Ir Thoracentesis Asp Pleural Space W/img Guide  Result Date: 11/21/2018 INDICATION: Patient with history of lung cancer, now with pleural effusion. Request is made for diagnostic and therapeutic left thoracentesis. EXAM: ULTRASOUND GUIDED DIAGNOSTIC AND THERAPEUTIC LEFT THORACENTESIS MEDICATIONS: 10 mL 1% lidocaine COMPLICATIONS: None immediate. PROCEDURE: An ultrasound guided thoracentesis was thoroughly discussed with the patient and questions answered. The benefits, risks, alternatives and complications were also discussed. The patient understands and wishes to proceed with the procedure. Written consent was obtained. Ultrasound was performed to localize and mark an adequate pocket of fluid in the left chest. The area was then prepped and draped in the normal sterile fashion. 1% Lidocaine was used for local anesthesia. Under ultrasound guidance a 6 Fr Safe-T-Centesis catheter was introduced. Thoracentesis was performed. The catheter was removed and a dressing applied. FINDINGS: A total of approximately 1.3 liters of yellow fluid was removed. Samples were sent to the laboratory as requested by the clinical team. IMPRESSION: Successful ultrasound guided diagnostic and therapeutic left thoracentesis yielding 1.3 liters of pleural fluid. Read by: Brynda Greathouse PA-C Electronically Signed   By: Markus Daft M.D.    On: 11/21/2018 12:50   US Thoracentesis Asp Pleural Space W/img Guide  Result Date: 11/30/2018 INDICATION: Patient with history of stage 3 SCC of the left upper lobe s/p radiation and incomplete chemotherapy, COPD, CHF who presented to [facility] ED this morning with complaints of weakness and dyspnea. Noted to have recurrent left pleural effusion - last thoracentesis 11/21/18 yielding 1.3 L pleural fluid. Request has been made to IR for diagnostic and therapeutic thoracentesis. EXAM: ULTRASOUND GUIDED LEFT THORACENTESIS MEDICATIONS: 8 mL 1% lidocaine. COMPLICATIONS: None immediate. PROCEDURE: An ultrasound guided thoracentesis was thoroughly discussed with the patient and questions answered. The benefits, risks, alternatives and complications were also discussed. The patient understands and wishes to proceed with the procedure. Written consent was obtained. Ultrasound was performed to localize and mark an adequate pocket of fluid in the left chest. The area was then prepped and draped in the normal sterile fashion. 1% Lidocaine was used for local anesthesia. Under ultrasound guidance a 6 Fr Safe-T-Centesis catheter was introduced. Thoracentesis was performed. The catheter was removed and a dressing applied. FINDINGS: A total of approximately 1.9 L of golden yellow fluid was removed. Procedure was aborted due to patient complaints of chest pain - some residual fluid remains on post procedure ultrasound. samples were sent to the laboratory as requested by the clinical team. IMPRESSION: Successful ultrasound guided left thoracentesis yielding 1.9 L of pleural fluid. Read by Candiss Norse, PA-C Electronically Signed   By: Aletta Edouard M.D.   On: 11/30/2018 10:51     Antimicrobials:   Unasyn day #5   Subjective: No acute events overnight.  Patient answers questions although minimally interactive, discussed his  cancer diagnosis with patient and he reports it is going "to get me one way or the  other"  Objective: Vitals:   12/04/18 0950 12/04/18 0958 12/04/18 1013 12/04/18 1310  BP: (!) 154/54 (!) 154/65 (!) 161/48 (!) 169/64  Pulse: 60 (!) 57 61 66  Resp:  20 20   Temp:  98.3 F (36.8 C) 97.6 F (36.4 C) 98.6 F (37 C)  TempSrc:  Oral Oral Oral  SpO2:  97% 98% 98%  Weight:      Height:        Intake/Output Summary (Last 24 hours) at 12/04/2018 1345 Last data filed at 12/04/2018 1217 Gross per 24 hour  Intake 2264.99 ml  Output 3000 ml  Net -735.01 ml   Filed Weights   11/30/18 0917 12/01/18 0524 12/04/18 0636  Weight: 87.4 kg 86.2 kg 90 kg    Examination:  General exam: Appears calm and comfortable minimally interactive Respiratory system: Mild rhonchi partially cleared by coughing. Cardiovascular system: S1 & S2 heard, RRR. No JVD, murmurs, rubs, gallops or clicks. No pedal edema. Gastrointestinal system: Abdomen is nondistended, soft and nontender. No organomegaly or masses felt. Normal bowel sounds heard. Central nervous system: Alert and oriented. No focal neurological deficits. Extremities: Symmetric 5 x 5 power.  No obvious contractures Skin: No rashes, lesions or ulcers no obvious draining lesions Psychiatry: Poor judgment, did express understanding of his diagnosis and its seriousness..     Data Reviewed: I have personally reviewed following labs and imaging studies  CBC: Recent Labs  Lab 11/29/18 1955 12/01/18 0306 12/02/18 0420 12/03/18 0337 12/04/18 0227  WBC 7.8 6.7 9.5 8.2 6.1  HGB 8.5* 7.0* 7.3* 7.1* 6.1*  HCT 29.4* 23.9* 24.2* 23.4* 20.6*  MCV 79.2* 78.6* 77.6* 78.3* 77.7*  PLT 333 281 296 295 536   Basic Metabolic Panel: Recent Labs  Lab 11/29/18 1955 12/01/18 0306 12/02/18 0420 12/03/18 0337 12/04/18 0227  NA 136 138 135 135 136  K 3.4* 3.8 3.8 4.0 4.2  CL 97* 103 102 102 103  CO2 _0 GLUCOSE 121* 104* 117* 171* 105*  BUN 31* 20 22 24* 25*  CREATININE 1.72* 1.17 1.39* 1.56* 1.30*  CALCIUM 8.3* 8.2* 8.1*  8.0* 8.0*   GFR: Estimated Creatinine Clearance: 63.6 mL/min (A) (by C-G formula based on SCr of 1.3 mg/dL (H)). Liver Function Tests: No results for input(s): AST, ALT, ALKPHOS, BILITOT, PROT, ALBUMIN in the last 168 hours. No results for input(s): LIPASE, AMYLASE in the last 168 hours. No results for input(s): AMMONIA in the last 168 hours. Coagulation Profile: No results for input(s): INR, PROTIME in the last 168 hours. Cardiac Enzymes: Recent Labs  Lab 11/30/18 0018 11/30/18 0359  TROPONINI 0.06* 0.06*   BNP (last 3 results) No results for input(s): PROBNP in the last 8760 hours. HbA1C: No results for input(s): HGBA1C in the last 72 hours. CBG: Recent Labs  Lab 12/01/18 2119 12/02/18 2123 12/03/18 2047 12/04/18 0744 12/04/18 1215  GLUCAP 151* 130* 159* 87 128*   Lipid Profile: No results for input(s): CHOL, HDL, LDLCALC, TRIG, CHOLHDL, LDLDIRECT in the last 72 hours. Thyroid Function Tests: No results for input(s): TSH, T4TOTAL, FREET4, T3FREE, THYROIDAB in the last 72 hours. Anemia Panel: No results for input(s): VITAMINB12, FOLATE, FERRITIN, TIBC, IRON, RETICCTPCT in the last 72 hours. Sepsis Labs: No results for input(s): PROCALCITON, LATICACIDVEN in the last 168 hours.  Recent Results (from the past 240 hour(s))  Culture, body fluid-bottle  Status: None (Preliminary result)   Collection Time: 11/30/18 10:56 AM  Result Value Ref Range Status   Specimen Description PLEURAL LEFT  Final   Special Requests NONE  Final   Culture   Final    NO GROWTH 4 DAYS Performed at [facility], [address] [place], [place] [address]    Report Status PENDING  Incomplete  Gram stain     Status: None   Collection Time: 11/30/18 10:56 AM  Result Value Ref Range Status   Specimen Description PLEURAL LEFT  Final   Special Requests NONE  Final   Gram Stain   Final    RARE WBC PRESENT,BOTH PMN AND MONONUCLEAR NO ORGANISMS SEEN Performed at [facility], [place] [address] [place], [place] [address]    Report Status 12/01/2018 FINAL  Final         Radiology Studies: No results found.      Scheduled Meds:  sodium chloride   Intravenous Once   aspirin EC  81 mg Oral Daily   docusate sodium  100 mg Oral BID   enoxaparin (LOVENOX) injection  40 mg Subcutaneous Q24H   ferrous sulfate  325 mg Oral TID WC   levothyroxine  112 mcg Oral QAC breakfast   metoprolol succinate  25 mg Oral Daily   nicotine  14 mg Transdermal Daily   pantoprazole  40 mg Oral QHS   umeclidinium-vilanterol  1 puff Inhalation Daily   Continuous Infusions:  ampicillin-sulbactam (UNASYN) IV 3 g (12/04/18 1325)     LOS: 3 days    Time spent: [place]    Nicolette Bang, MD [facility]  If 7PM-7AM, please contact night-coverage  12/04/2018, 1:45 PM

## 2018-12-04 NOTE — Telephone Encounter (Signed)
Scheduled appt per sch msg. Called and left patient a msg.

## 2018-12-05 ENCOUNTER — Inpatient Hospital Stay (HOSPITAL_COMMUNITY): Payer: Medicare Other

## 2018-12-05 LAB — CBC WITH DIFFERENTIAL/PLATELET
Abs Immature Granulocytes: 0.03 10*3/uL (ref 0.00–0.07)
Basophils Absolute: 0 10*3/uL (ref 0.0–0.1)
Basophils Relative: 0 %
Eosinophils Absolute: 0.2 10*3/uL (ref 0.0–0.5)
Eosinophils Relative: 2 %
HCT: 23.2 % — ABNORMAL LOW (ref 39.0–52.0)
Hemoglobin: 7.1 g/dL — ABNORMAL LOW (ref 13.0–17.0)
Immature Granulocytes: 0 %
Lymphocytes Relative: 6 %
Lymphs Abs: 0.5 10*3/uL — ABNORMAL LOW (ref 0.7–4.0)
MCH: 24.3 pg — ABNORMAL LOW (ref 26.0–34.0)
MCHC: 30.6 g/dL (ref 30.0–36.0)
MCV: 79.5 fL — ABNORMAL LOW (ref 80.0–100.0)
Monocytes Absolute: 0.7 10*3/uL (ref 0.1–1.0)
Monocytes Relative: 11 %
Neutro Abs: 5.6 10*3/uL (ref 1.7–7.7)
Neutrophils Relative %: 81 %
Platelets: 284 10*3/uL (ref 150–400)
RBC: 2.92 MIL/uL — ABNORMAL LOW (ref 4.22–5.81)
RDW: 17.4 % — ABNORMAL HIGH (ref 11.5–15.5)
WBC: 7 10*3/uL (ref 4.0–10.5)
nRBC: 0 % (ref 0.0–0.2)

## 2018-12-05 LAB — COMPREHENSIVE METABOLIC PANEL
ALT: 21 U/L (ref 0–44)
AST: 28 U/L (ref 15–41)
Albumin: 1.4 g/dL — ABNORMAL LOW (ref 3.5–5.0)
Alkaline Phosphatase: 173 U/L — ABNORMAL HIGH (ref 38–126)
Anion gap: 8 (ref 5–15)
BUN: 26 mg/dL — ABNORMAL HIGH (ref 8–23)
CO2: 25 mmol/L (ref 22–32)
Calcium: 8.2 mg/dL — ABNORMAL LOW (ref 8.9–10.3)
Chloride: 103 mmol/L (ref 98–111)
Creatinine, Ser: 1.35 mg/dL — ABNORMAL HIGH (ref 0.61–1.24)
GFR calc Af Amer: >60 mL/min (ref >60)
GFR calc non Af Amer: 56 mL/min — ABNORMAL LOW (ref >60)
Glucose, Bld: 100 mg/dL — ABNORMAL HIGH (ref 70–99)
Potassium: 4.1 mmol/L (ref 3.5–5.1)
Sodium: 136 mmol/L (ref 135–145)
Total Bilirubin: 0.6 mg/dL (ref 0.3–1.2)
Total Protein: 6 g/dL — ABNORMAL LOW (ref 6.5–8.1)

## 2018-12-05 LAB — CULTURE, BODY FLUID W GRAM STAIN -BOTTLE: Culture: NO GROWTH

## 2018-12-05 NOTE — Progress Notes (Signed)
Physical Therapy Treatment Patient Details Name: Brad [name] MRN: [id number] DOB: 04/16/1956 Today's Date: 12/05/2018    History of Present Illness Pt is a 63 y.o. male admitted 11/29/18 with generalized weakness; likely multifactorial due to sedentary, overdiuresis last admission and recurrent pleural effusion. S/p L-side thoracentesis 4/25. Of note, recent admission from jail 4/15-4/22 with likely CHF exacerbation; pt was reportedly d/c home on house arrest but unable to move. 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    PT Comments    Pt very impulsive with movement today, with poor safety awareness and insight. Pt up in room with OT on entry. Tried to encourage pt to ambulate and he refused, ambulated with him providing minA for steadying and management of IV pole. Once in recliner, pt reluctantly participated in seated exercises before refusing to do any more. D/c plan remains appropriate. PT will continue to follow acutely.   Follow Up Recommendations  SNF;Supervision for mobility/OOB(pending pt progression)     Equipment Recommendations  Rolling walker with 5" wheels       Precautions / Restrictions Precautions Precautions: Fall Restrictions Weight Bearing Restrictions: No    Mobility  Bed Mobility               General bed mobility comments: sitting on EoB on entry  Transfers Overall transfer level: Needs assistance Equipment used: Rolling walker (2 wheeled) Transfers: Sit to/from Stand Sit to Stand: Min guard         General transfer comment: min guard for safety with power up without AD, decreased stability but able to steady himself with out assit  Ambulation/Gait Ambulation/Gait assistance: Min assist Gait Distance (Feet): 25 Feet(1x15, 1x10) Assistive device: Rolling walker (2 wheeled);IV Pole Gait Pattern/deviations: Step-through pattern;Decreased stride length;Trunk flexed;Staggering  left;Staggering right Gait velocity: Decreased Gait velocity interpretation: <1.31 ft/sec, indicative of household ambulator General Gait Details: min A for steadying and for IV pole management which he seems completely oblivious about, would benefit from RW but refuse use, reaching out for sink and furniture in room to steady         Balance Overall balance assessment: Needs assistance   Sitting balance-Leahy Scale: Good       Standing balance-Leahy Scale: Fair Standing balance comment: Able to static stand without UE support minor LoB but able to steady himself                            Cognition Arousal/Alertness: Awake/alert Behavior During Therapy: Flat affect;Impulsive Overall Cognitive Status: No family/caregiver present to determine baseline cognitive functioning                                 General Comments: decreased safety awareness and very impulsive with movement       Exercises General Exercises - Lower Extremity Long Arc Quad: AROM;Both;10 reps;Seated Hip ABduction/ADduction: AROM;Both;10 reps;Seated Hip Flexion/Marching: AROM;Both;10 reps;Seated Toe Raises: AROM;Both;10 reps;Seated Heel Raises: AROM;Both;10 reps;Seated    General Comments General comments (skin integrity, edema, etc.): SoB with movement       Pertinent Vitals/Pain Pain Assessment: Faces Faces Pain Scale: Hurts little more Pain Location: generalized with movement Pain Descriptors / Indicators: Grimacing;Moaning Pain Intervention(s): Limited activity within patient's tolerance;Monitored during session;Repositioned           PT Goals (current goals can now be found in  the care plan section) Acute Rehab PT Goals Patient Stated Goal: Pt unsure about SNF stating, "Let's see how I feel in a few days" PT Goal Formulation: With patient Time For Goal Achievement: 12/14/18 Potential to Achieve Goals: Good Progress towards PT goals: Progressing toward goals     Frequency    Min 3X/week      PT Plan Current plan remains appropriate       AM-PAC PT "6 Clicks" Mobility   Outcome Measure  Help needed turning from your back to your side while in a flat bed without using bedrails?: None Help needed moving from lying on your back to sitting on the side of a flat bed without using bedrails?: None Help needed moving to and from a bed to a chair (including a wheelchair)?: A Little Help needed standing up from a chair using your arms (e.g., wheelchair or bedside chair)?: A Little Help needed to walk in hospital room?: A Little Help needed climbing 3-5 steps with a railing? : A Little 6 Click Score: 20    End of Session Equipment Utilized During Treatment: Gait belt Activity Tolerance: Patient limited by fatigue Patient left: in chair;with call bell/phone within reach;with chair alarm set Nurse Communication: Mobility status PT Visit Diagnosis: Other abnormalities of gait and mobility (R26.89);Muscle weakness (generalized) (M62.81)     Time: 5909-3112 PT Time Calculation (min) (ACUTE ONLY): 10 min  Charges:  $Therapeutic Exercise: 8-22 mins                     Brie Eppard B. Migdalia Dk PT, DPT Acute Rehabilitation Services Pager [phone] Office [phone]    [place] 12/05/2018, 3:03 PM

## 2018-12-05 NOTE — Care Management Important Message (Signed)
Important Message  Patient Details  Name: Brad [name] MRN: [id number] Date of Birth: Aug 06, 1956   Medicare Important Message Given:  Yes    Sulaiman Imbert 12/05/2018, 2:26 PM

## 2018-12-05 NOTE — TOC Progression Note (Signed)
Transition of Care [facility]) - Progression Note    Patient Details  Name: Brad [name] MRN: [id number] Date of Birth: March 15, 1956  Transition of Care [facility]) CM/SW Contact  Graves-Bigelow, Ocie Cornfield, RN Phone Number: 12/05/2018, 2:12 PM  Clinical Narrative:  Patient has been followed by [facility] in the hospital. Palliative recommendations for Palliative Outpatient in the Community. Patient has [place] did call [facility] to make referral- referral accepted via [place]. CM was able to set the patient up with [facility]. SOC to begin within 24-48 hours post transition home. Pt has limited mobility-only ambulating 12 feet with PT on 12-02-18  CM will continue to monitor for additional transition of care needs.    Expected Discharge Plan: Skilled Nursing Facility Barriers to Discharge: Continued Medical Work up  Expected Discharge Plan and Services Expected Discharge Plan: [place] In-house Referral: Clinical Social Work     Living arrangements for the past 2 months: Single Family Home                 DME Arranged: N/A DME Agency: NA       HH Arranged: RN, Disease Management, PT, Social Work [employer] Agency: [place] Date HH Agency Contacted: 12/05/18 Time [place]: 56 Representative spoke with at [place]: [place] ([place]) Interventions    Readmission Risk Interventions Readmission Risk Prevention Plan 12/04/2018  Transportation Screening Complete  HRI or Home Care Consult Complete  Palliative Care Screening Complete  Medication Review (RN Care Manager) Complete  Some recent data might be hidden

## 2018-12-05 NOTE — Progress Notes (Signed)
PROGRESS NOTE    Brad [name]  ZOX:[id number] DOB: Jun 30, 1956 DOA: 11/29/2018 PCP: Biagio Borg, MD   Brief Narrative:  63 y.o.malewith medical history significant ofCVA; stage 3 SCC of the LUL s/phemoradiation and incomplete chemotherapy; chronic combined CHF (EF 45-50% with grade 2 DD in 2/17); hypothyoiridsm; HTN; HLD; DM; chronic hep C; and COPD presenting with weakness. Since hospital discharge, he was placed on his front porch and he wasn't able to move. He was unable to get up and go to the bathroom. His legs are just too weak/ Denies SOB. No cough. Regarding his cancer, was last seen by oncology in February 2019 then lost to follow-up. He was admitted from 4/15-22 from jail (incarcerated since 8/11/91YNW uncertain reason; prior conviction on 12/02/12 for taking indecent liberties with a child) with pleural effusion, negative for malignancy, thought to be due to CHF exacerbation. He was diuresed 18L during the hospitalization. He has h/o GI bleeding and anemia of chronic disease and his Hgb was as low as 6.6, requiring transfusion of 1 unit PRBC.  ED Course:Recently discharged to home from jail. Chronically elevated troponin, stable. Not clearly in CHF. No hypoxia. Had thoracentesis during prior hospitalization, now opacified. Too weak to go home, tachypnea with reaccummulation of fluid after last hospitalization   Assessment & Plan:   Principal Problem:   Weakness Active Problems:   Diabetes ([place])   Hypothyroidism   Tobacco abuse   Essential hypertension   HLD (hyperlipidemia)   Pleural effusion   Stage III squamous cell carcinoma of left lung (HCC)   AKI (acute kidney injury) (HCC)   Chronic combined systolic and diastolic CHF (congestive heart failure) (HCC)   Widely metastatic left lung cancer, as previously noted, CT scan 4/25 shows cancer worsening,"Large centrally necrotic mass in the left upper lobe, likely toreflect treated neoplasm. Given  the appearance of this lesion on today's examination, this is favored to reflect a large pulmonary abscess at this time. This is associated with a moderate loculated left pleural effusion, largest component of which is subpulmonic in position and also with new 2.8 x 1.8 cm RLL pulm nodule, multiple right adrenal nodules and masses, and new lytic lesions in the left side of the manubrium and the right S1 vertebra".Patient was lost to follow-up was on concurrent chemoradiation in 2019 February. Oncology has been consulted and patient has agreed for follow-up as an outpatient in 1 to 2 weeks.  Oncology is helping assist with transportation for that follow-up.Palliative care met with the patient and he indicated he would not want to do chemotherapy also so it is unclear if a follow-up appoint will have much significance in terms of his outcome.Continue on Unasyn FOR AN ADDITIONAL2 DAYS  Large loculated left pleural effusionstatus post thoracentesis 1.9 L fluid removal, suspect malignant effusion. Gram stain culture no growth, fluid appears exudative.Cytology negative for malignant cells. He isafebrile , blood cultures no growth so far. On unasyn for now-see above.Seen by CT surgery advised intermittent thoracentesis, Pleurx catheter for comfort if recurrent, cxr still with opacification  Generalized Weakness: Multifactorial in the setting of metastatic cancer, anemia. PT OT evaluation appreciated, patient appears to be resistant to skilled nursing facility although would benefit, there is difficulty getting home health agencies because of his sex offender status, discussed with treatment team who are evaluating additional options.  Chronic combined systolic and diastolic CHF: TTE lvef 29-56%, severe concentric LVH, severe LAE moderate RA and concern for infiltrative process on recent echo. compensated. Patient overdiuresied  on recent admission.Continue gentle IV fluids for AKI.Holding  diuretics for now  COPD,stable continue bronchodilators.  Acute anemia of chronic disease/malignancywith hemoglobin 6.1g. Continue iron supplementation, transfuse for hemoglobin less than 7.  With 1 unit ordered for today  Prediabetes:Hha1c 6.2 stable. Controlled. Monitor CBG  Hypothyroidismon levothyroxine normal dysregulation  Tobacco abuse: Cessation advised  Essential hypertension: Stable on metoprolol  AKI: creat slightly up trending. Continue gentle IV fluids, recheck labs in the morning  History of CVA/PVD/Carotid endarterectomy, on aspirin  History of duodenal ulcer- cont ppi  History of chronic hep C.  No acute intervention currently monitor LFTs as needed  Recently discharged from jail. Has a monitoring device on 1 of his legs  Noncompliance/poor follow-up: Patient has agreed to follow-up with oncology although with his lack of interest in doing chemotherapy this will be of questionable benefit  DVT prophylaxis: Lovenox SQ  Code Status: DNR    Code Status Orders  (From admission, onward)         Start     Ordered   12/03/18 1231  Do not attempt resuscitation (DNR)  Continuous    Question Answer Comment  In the event of cardiac or respiratory ARREST Do not call a code blue   In the event of cardiac or respiratory ARREST Do not perform Intubation, CPR, defibrillation or ACLS   In the event of cardiac or respiratory ARREST Use medication by any route, position, wound care, and other measures to relive pain and suffering. May use oxygen, suction and manual treatment of airway obstruction as needed for comfort.      12/03/18 1231        Code Status History    Date Active Date Inactive Code Status Order ID Comments User Context   11/30/2018 0818 12/03/2018 1231 Full Code [id number]  Karmen Bongo, MD ED   11/20/2018 2224 11/27/2018 1517 Full Code [id number]  Lenore Cordia, MD ED   12/08/2015 1011 12/09/2015 1546 Full Code [id number]  Rondel Jumbo, PA-C ED   09/27/2015 1824 09/29/2015 2201 Full Code [id number]  Charlynne Cousins, MD Inpatient   11/13/2014 1245 11/14/2014 1138 Full Code [id number]  Serafina Mitchell, MD Inpatient   09/23/2014 1223 09/25/2014 1557 Full Code [id number]  Kelvin Cellar, MD ED   02/28/2014 1725 03/01/2014 1908 Full Code [id number]  Modena Jansky, MD Inpatient     Family Communication: NONE PRESENT Disposition Plan:   Skilled nursing facility versus home with home health if able to locate an agency.  Patient will need to remain in the hospital inpatient for continued IV antibiotics, as well as MONITORING OF HGB 2/2 ANEMIA REQUIRING TRANSFUSION.  He risks hemodynamic collapse without these treatments. Consults called: Palliative care, oncology Admission status: Inpatient   Consultants:   as above  Procedures:  Dg Chest 1 View  Result Date: 11/30/2018 CLINICAL DATA:  Status post left thoracentesis. EXAM: CHEST  1 VIEW COMPARISON:  November 30, 2018 FINDINGS: A significant left-sided pleural effusion remains. No pneumothorax after thoracentesis. The right lung is clear. IMPRESSION: Significant effusion and underlying opacity remains on the left. However, no pneumothorax is seen after left-sided thoracentesis. Electronically Signed   By: Dorise Bullion III M.D   On: 11/30/2018 11:28   Dg Chest 1 View  Result Date: 11/21/2018 CLINICAL DATA:  Follow-up thoracentesis EXAM: CHEST  1 VIEW COMPARISON:  11/20/2018 FINDINGS: Right chest remains clear. Opacification of the left lung appears progressive. The amount of pleural density appears similar. No evidence of  pneumothorax. IMPRESSION: Similar appearance of pleural density on the left. Worsened opacity of the left lung. Electronically Signed   By: Nelson Chimes M.D.   On: 11/21/2018 12:31   Dg Chest 2 View  Result Date: 11/30/2018 CLINICAL DATA:  Shortness of breath EXAM: CHEST - 2 VIEW COMPARISON:  11/21/2018 FINDINGS: Continued near complete opacification of the left  hemithorax with left pleural density and airspace disease. Right lung clear. There is cardiomegaly. No acute bony abnormality. IMPRESSION: Continued near complete opacification of the left hemithorax with diffuse left lung airspace disease and left pleural density, presumably effusion. Electronically Signed   By: Rolm Baptise M.D.   On: 11/30/2018 02:27   Dg Chest 2 View  Result Date: 11/20/2018 CLINICAL DATA:  Chest pain EXAM: CHEST - 2 VIEW COMPARISON:  06/26/2017 FINDINGS: Large left pleural effusion with basilar and apical components. Approximately 50% volume loss within the left lung. Left chest surgical clips. Right lung is clear. IMPRESSION: Large left pleural effusion with basilar and apical components with moderate left lung volume loss. Electronically Signed   By: Ulyses Jarred M.D.   On: 11/20/2018 17:51   Ct Chest W Contrast  Result Date: 11/30/2018 CLINICAL DATA:  63 year old male with history of non-small cell lung cancer. Staging examination. EXAM: CT CHEST, ABDOMEN, AND PELVIS WITH CONTRAST TECHNIQUE: Multidetector CT imaging of the chest, abdomen and pelvis was performed following the standard protocol during bolus administration of intravenous contrast. CONTRAST:  72m OMNIPAQUE IOHEXOL 300 MG/ML  SOLN COMPARISON:  PET-CT 04/25/2017. FINDINGS: CT CHEST FINDINGS Cardiovascular: Heart size is mildly enlarged. There is no significant pericardial fluid, thickening or pericardial calcification. There is aortic atherosclerosis, as well as atherosclerosis of the great vessels of the mediastinum and the coronary arteries, including calcified atherosclerotic plaque in the left main, left anterior descending, left circumflex and right coronary arteries. Mediastinum/Nodes: No pathologically enlarged mediastinal or hilar lymph nodes. Esophagus is unremarkable in appearance. No axillary lymphadenopathy. Lungs/Pleura: Previously noted left upper lobe nodule is no longer clearly identifiable. Several  fiducial markers are noted in the left upper lobe. On today's examination there is a new 7.8 x 9.5 x 8.3 cm mass-like area (axial image 21 of series 4 and coronal image 105 of series 6) which is centrally hypovascular with multiple internal locules of gas, favored to represent a large centrally necrotic mass, likely with superinfection (i.e., a large pulmonary abscess) with some adjacent postobstructive changes in the remaining portions of the left upper lobe. This is associated with outward bowing of the left major fissure. Some consolidative changes in volume loss are noted in the anterior and lateral aspect of the left lower lobe. Moderate left pleural effusion predominantly loculated in a subpulmonic position. New right lower lobe pulmonary nodule (axial image 131 of series 5) measuring 2.8 x 1.8 cm, concerning for metastatic lesion. No right pleural effusion. Musculoskeletal: Lytic lesion in the left side of the manubrium measuring 2.5 x 1.7 cm (axial image 13 of series 4). CT ABDOMEN PELVIS FINDINGS Hepatobiliary: No suspicious cystic or solid hepatic lesions. No intra or extrahepatic biliary ductal dilatation. Gallbladder is unremarkable in appearance. Pancreas: No pancreatic mass. No pancreatic ductal dilatation. No pancreatic or peripancreatic fluid or inflammatory changes. Spleen: Unremarkable. Adrenals/Urinary Tract: Multiple new right adrenal nodules and masses, compatible with metastatic disease, largest of which measures 2.8 x 3.8 cm (axial image 71 of series 4). Left adrenal gland is normal in appearance. Left kidney is normal in appearance. Multiple low-attenuation lesions in the right  kidney, compatible with simple cysts measuring up to 4.2 cm in the interpolar region. In the lower pole of the right kidney there is also an exophytic intermediate attenuation lesion which is incompletely characterized, but similar to prior PET-CT 04/25/2017, favored to represent a mildly proteinaceous/hemorrhagic  cyst. No hydroureteronephrosis. Urinary bladder is normal in appearance. Stomach/Bowel: Normal appearance of the stomach. No pathologic dilatation of small bowel or colon. Numerous colonic diverticulae are noted, particularly in the proximal sigmoid colon, without surrounding inflammatory changes to suggest an acute diverticulitis at this time. Normal appendix. Vascular/Lymphatic: Aortic atherosclerosis, without evidence of aneurysm or dissection in the abdominal or pelvic vasculature. No lymphadenopathy noted in the abdomen or pelvis. Reproductive: Prostate gland and seminal vesicles are unremarkable in appearance. Other: No significant volume of ascites.  No pneumoperitoneum. Musculoskeletal: Large lytic lesion in the right side of the S1 vertebra measuring 3.2 x 4.2 cm (axial image 97 of series 4). IMPRESSION: 1. Large centrally necrotic mass in the left upper lobe, likely to reflect treated neoplasm. Given the appearance of this lesion on today's examination, this is favored to reflect a large pulmonary abscess at this time. This is associated with a moderate loculated left pleural effusion, largest component of which is subpulmonic in position. 2. New 2.8 x 1.8 cm right lower lobe pulmonary nodule, multiple right adrenal nodules and masses, and new lytic lesions in the left side of the manubrium and the right S1 vertebra, compatible with widespread metastatic disease. 3. Colonic diverticulosis without evidence of acute diverticulitis at this time. 4. Aortic atherosclerosis, in addition to left main and 3 vessel coronary artery disease. Please note that although the presence of coronary artery calcium documents the presence of coronary artery disease, the severity of this disease and any potential stenosis cannot be assessed on this non-gated CT examination. Assessment for potential risk factor modification, dietary therapy or pharmacologic therapy may be warranted, if clinically indicated. 5. Additional  incidental findings, as above. Electronically Signed   By: Vinnie Langton M.D.   On: 11/30/2018 12:02   Ct Abdomen Pelvis W Contrast  Result Date: 11/30/2018 CLINICAL DATA:  63 year old male with history of non-small cell lung cancer. Staging examination. EXAM: CT CHEST, ABDOMEN, AND PELVIS WITH CONTRAST TECHNIQUE: Multidetector CT imaging of the chest, abdomen and pelvis was performed following the standard protocol during bolus administration of intravenous contrast. CONTRAST:  57m OMNIPAQUE IOHEXOL 300 MG/ML  SOLN COMPARISON:  PET-CT 04/25/2017. FINDINGS: CT CHEST FINDINGS Cardiovascular: Heart size is mildly enlarged. There is no significant pericardial fluid, thickening or pericardial calcification. There is aortic atherosclerosis, as well as atherosclerosis of the great vessels of the mediastinum and the coronary arteries, including calcified atherosclerotic plaque in the left main, left anterior descending, left circumflex and right coronary arteries. Mediastinum/Nodes: No pathologically enlarged mediastinal or hilar lymph nodes. Esophagus is unremarkable in appearance. No axillary lymphadenopathy. Lungs/Pleura: Previously noted left upper lobe nodule is no longer clearly identifiable. Several fiducial markers are noted in the left upper lobe. On today's examination there is a new 7.8 x 9.5 x 8.3 cm mass-like area (axial image 21 of series 4 and coronal image 105 of series 6) which is centrally hypovascular with multiple internal locules of gas, favored to represent a large centrally necrotic mass, likely with superinfection (i.e., a large pulmonary abscess) with some adjacent postobstructive changes in the remaining portions of the left upper lobe. This is associated with outward bowing of the left major fissure. Some consolidative changes in volume loss are noted  in the anterior and lateral aspect of the left lower lobe. Moderate left pleural effusion predominantly loculated in a subpulmonic  position. New right lower lobe pulmonary nodule (axial image 131 of series 5) measuring 2.8 x 1.8 cm, concerning for metastatic lesion. No right pleural effusion. Musculoskeletal: Lytic lesion in the left side of the manubrium measuring 2.5 x 1.7 cm (axial image 13 of series 4). CT ABDOMEN PELVIS FINDINGS Hepatobiliary: No suspicious cystic or solid hepatic lesions. No intra or extrahepatic biliary ductal dilatation. Gallbladder is unremarkable in appearance. Pancreas: No pancreatic mass. No pancreatic ductal dilatation. No pancreatic or peripancreatic fluid or inflammatory changes. Spleen: Unremarkable. Adrenals/Urinary Tract: Multiple new right adrenal nodules and masses, compatible with metastatic disease, largest of which measures 2.8 x 3.8 cm (axial image 71 of series 4). Left adrenal gland is normal in appearance. Left kidney is normal in appearance. Multiple low-attenuation lesions in the right kidney, compatible with simple cysts measuring up to 4.2 cm in the interpolar region. In the lower pole of the right kidney there is also an exophytic intermediate attenuation lesion which is incompletely characterized, but similar to prior PET-CT 04/25/2017, favored to represent a mildly proteinaceous/hemorrhagic cyst. No hydroureteronephrosis. Urinary bladder is normal in appearance. Stomach/Bowel: Normal appearance of the stomach. No pathologic dilatation of small bowel or colon. Numerous colonic diverticulae are noted, particularly in the proximal sigmoid colon, without surrounding inflammatory changes to suggest an acute diverticulitis at this time. Normal appendix. Vascular/Lymphatic: Aortic atherosclerosis, without evidence of aneurysm or dissection in the abdominal or pelvic vasculature. No lymphadenopathy noted in the abdomen or pelvis. Reproductive: Prostate gland and seminal vesicles are unremarkable in appearance. Other: No significant volume of ascites.  No pneumoperitoneum. Musculoskeletal: Large lytic  lesion in the right side of the S1 vertebra measuring 3.2 x 4.2 cm (axial image 97 of series 4). IMPRESSION: 1. Large centrally necrotic mass in the left upper lobe, likely to reflect treated neoplasm. Given the appearance of this lesion on today's examination, this is favored to reflect a large pulmonary abscess at this time. This is associated with a moderate loculated left pleural effusion, largest component of which is subpulmonic in position. 2. New 2.8 x 1.8 cm right lower lobe pulmonary nodule, multiple right adrenal nodules and masses, and new lytic lesions in the left side of the manubrium and the right S1 vertebra, compatible with widespread metastatic disease. 3. Colonic diverticulosis without evidence of acute diverticulitis at this time. 4. Aortic atherosclerosis, in addition to left main and 3 vessel coronary artery disease. Please note that although the presence of coronary artery calcium documents the presence of coronary artery disease, the severity of this disease and any potential stenosis cannot be assessed on this non-gated CT examination. Assessment for potential risk factor modification, dietary therapy or pharmacologic therapy may be warranted, if clinically indicated. 5. Additional incidental findings, as above. Electronically Signed   By: Vinnie Langton M.D.   On: 11/30/2018 12:02   Dg Chest Port 1 View  Result Date: 12/05/2018 CLINICAL DATA:  Pleural effusion. EXAM: PORTABLE CHEST 1 VIEW COMPARISON:  CT scan and radiograph of November 30, 2018. FINDINGS: Stable near complete opacification of left hemithorax is noted, with stable aerated lung seen in the left lower lobe. This is most consistent with pneumonia or atelectasis with associated pleural effusion. Mild mediastinal shift from right to left is noted. Atherosclerosis of thoracic aorta is noted. No pneumothorax is noted. Bony thorax is unremarkable. IMPRESSION: Stable left lung findings as described above. Aortic  Atherosclerosis  (ICD10-I70.0). Electronically Signed   By: Marijo Conception M.D.   On: 12/05/2018 07:41   Nm Scan Tumor Localize With Spect  Result Date: 11/25/2018 CLINICAL DATA:  HEART FAILURE. CONCERN FOR CARDIAC AMYLOIDOSIS. EXAM: NUCLEAR MEDICINE TUMOR LOCALIZATION. PYP CARDIAC AMYLOIDOSIS SCAN WITH SPECT TECHNIQUE: Following intravenous administration of radiopharmaceutical, anterior planar images of the chest were obtained. Regions of interest were placed on the heart and contralateral chest wall for quantitative assessment. Additional SPECT imaging of the chest was obtained. RADIOPHARMACEUTICALS:  21.7 mCi TECHNETIUM 99 PYROPHOSPHATE FINDINGS: Planar Visual assessment: Anterior planar imaging demonstrates radiotracer uptake within the heart less than than uptake within the adjacent ribs (Grade 1). Quantitative assessment : Quantitative assessment of the cardiac uptake compared to the contralateral chest wall is equal to 1.0 (H/CL = 1.0). SPECT assessment: SPECT imaging of the chest demonstrates minimal radiotracer accumulation within the LEFT ventricle. IMPRESSION: Visual and quantitative assessment (grade 1, H/CLL equal 1.0) are NOT suggestive of transthyretin amyloidosis. Electronically Signed   By: Suzy Bouchard M.D.   On: 11/25/2018 14:33   Ir Thoracentesis Asp Pleural Space W/img Guide  Result Date: 11/21/2018 INDICATION: Patient with history of lung cancer, now with pleural effusion. Request is made for diagnostic and therapeutic left thoracentesis. EXAM: ULTRASOUND GUIDED DIAGNOSTIC AND THERAPEUTIC LEFT THORACENTESIS MEDICATIONS: 10 mL 1% lidocaine COMPLICATIONS: None immediate. PROCEDURE: An ultrasound guided thoracentesis was thoroughly discussed with the patient and questions answered. The benefits, risks, alternatives and complications were also discussed. The patient understands and wishes to proceed with the procedure. Written consent was obtained. Ultrasound was performed to localize and mark an  adequate pocket of fluid in the left chest. The area was then prepped and draped in the normal sterile fashion. 1% Lidocaine was used for local anesthesia. Under ultrasound guidance a 6 Fr Safe-T-Centesis catheter was introduced. Thoracentesis was performed. The catheter was removed and a dressing applied. FINDINGS: A total of approximately 1.3 liters of yellow fluid was removed. Samples were sent to the laboratory as requested by the clinical team. IMPRESSION: Successful ultrasound guided diagnostic and therapeutic left thoracentesis yielding 1.3 liters of pleural fluid. Read by: Brynda Greathouse PA-C Electronically Signed   By: Markus Daft M.D.   On: 11/21/2018 12:50   US Thoracentesis Asp Pleural Space W/img Guide  Result Date: 11/30/2018 INDICATION: Patient with history of stage 3 SCC of the left upper lobe s/p radiation and incomplete chemotherapy, COPD, CHF who presented to [facility] ED this morning with complaints of weakness and dyspnea. Noted to have recurrent left pleural effusion - last thoracentesis 11/21/18 yielding 1.3 L pleural fluid. Request has been made to IR for diagnostic and therapeutic thoracentesis. EXAM: ULTRASOUND GUIDED LEFT THORACENTESIS MEDICATIONS: 8 mL 1% lidocaine. COMPLICATIONS: None immediate. PROCEDURE: An ultrasound guided thoracentesis was thoroughly discussed with the patient and questions answered. The benefits, risks, alternatives and complications were also discussed. The patient understands and wishes to proceed with the procedure. Written consent was obtained. Ultrasound was performed to localize and mark an adequate pocket of fluid in the left chest. The area was then prepped and draped in the normal sterile fashion. 1% Lidocaine was used for local anesthesia. Under ultrasound guidance a 6 Fr Safe-T-Centesis catheter was introduced. Thoracentesis was performed. The catheter was removed and a dressing applied. FINDINGS: A total of approximately 1.9 L of golden yellow fluid was  removed. Procedure was aborted due to patient complaints of chest pain - some residual fluid remains on post procedure ultrasound. samples were sent  to the laboratory as requested by the clinical team. IMPRESSION: Successful ultrasound guided left thoracentesis yielding 1.9 L of pleural fluid. Read by Candiss Norse, PA-C Electronically Signed   By: Aletta Edouard M.D.   On: 11/30/2018 10:51     Antimicrobials:   unasyn day 6   Subjective: Patient reports still feels generally weak, incisional overnight,  Objective: Vitals:   12/04/18 1706 12/04/18 2207 12/05/18 0500 12/05/18 0809  BP: 125/75 (!) 169/63 (!) 170/58   Pulse: 82 64 (!) 59   Resp: 20     Temp: 98.1 F (36.7 C) 98.8 F (37.1 C) 98.6 F (37 C)   TempSrc: Oral Oral Oral   SpO2: 98% 96% 96% 94%  Weight:   90.4 kg   Height:        Intake/Output Summary (Last 24 hours) at 12/05/2018 1248 Last data filed at 12/05/2018 1217 Gross per 24 hour  Intake 840 ml  Output 3425 ml  Net -2585 ml   Filed Weights   12/01/18 0524 12/04/18 0636 12/05/18 0500  Weight: 86.2 kg 90 kg 90.4 kg    Examination:  General exam: Appears calm, cooperative Respiratory system: Creased breath sounds left side rales left side. Cardiovascular system: S1 & S2 heard, RRR. No JVD, murmurs, rubs, gallops or clicks. No pedal edema. Gastrointestinal system: Abdomen is nondistended, soft and nontender. No organomegaly or masses felt. Normal bowel sounds heard. Central nervous system: Alert and oriented. No focal neurological deficits. Extremities: W WP no obvious contractures. Skin: No rashes, lesions or ulcers Psychiatry: Judgement and insight appear normal. Mood & affect flat    Data Reviewed: I have personally reviewed following labs and imaging studies  CBC: Recent Labs  Lab 12/01/18 0306 12/02/18 0420 12/03/18 0337 12/04/18 0227 12/05/18 0339  WBC 6.7 9.5 8.2 6.1 7.0  NEUTROABS  --   --   --   --  5.6  HGB 7.0* 7.3* 7.1* 6.1*  7.1*  HCT 23.9* 24.2* 23.4* 20.6* 23.2*  MCV 78.6* 77.6* 78.3* 77.7* 79.5*  PLT 281 296 295 262 450   Basic Metabolic Panel: Recent Labs  Lab 12/01/18 0306 12/02/18 0420 12/03/18 0337 12/04/18 0227 12/05/18 0339  NA 138 135 135 136 136  K 3.8 3.8 4.0 4.2 4.1  CL 103 102 102 103 103  CO2 27 24 23 23 25   GLUCOSE 104* 117* 171* 105* 100*  BUN 20 22 24* 25* 26*  CREATININE 1.17 1.39* 1.56* 1.30* 1.35*  CALCIUM 8.2* 8.1* 8.0* 8.0* 8.2*   GFR: Estimated Creatinine Clearance: 66.1 mL/min (A) (by C-G formula based on SCr of 1.35 mg/dL (H)). Liver Function Tests: Recent Labs  Lab 12/05/18 0339  AST 28  ALT 21  ALKPHOS 173*  BILITOT 0.6  PROT 6.0*  ALBUMIN 1.4*   No results for input(s): LIPASE, AMYLASE in the last 168 hours. No results for input(s): AMMONIA in the last 168 hours. Coagulation Profile: No results for input(s): INR, PROTIME in the last 168 hours. Cardiac Enzymes: Recent Labs  Lab 11/30/18 0018 11/30/18 0359  TROPONINI 0.06* 0.06*   BNP (last 3 results) No results for input(s): PROBNP in the last 8760 hours. HbA1C: No results for input(s): HGBA1C in the last 72 hours. CBG: Recent Labs  Lab 12/02/18 2123 12/03/18 2047 12/04/18 0744 12/04/18 1215 12/04/18 1702  GLUCAP 130* 159* 87 128* 115*   Lipid Profile: No results for input(s): CHOL, HDL, LDLCALC, TRIG, CHOLHDL, LDLDIRECT in the last 72 hours. Thyroid Function Tests: No results for  input(s): TSH, T4TOTAL, FREET4, T3FREE, THYROIDAB in the last 72 hours. Anemia Panel: No results for input(s): VITAMINB12, FOLATE, FERRITIN, TIBC, IRON, RETICCTPCT in the last 72 hours. Sepsis Labs: No results for input(s): PROCALCITON, LATICACIDVEN in the last 168 hours.  Recent Results (from the past 240 hour(s))  Culture, body fluid-bottle     Status: None   Collection Time: 11/30/18 10:56 AM  Result Value Ref Range Status   Specimen Description PLEURAL LEFT  Final   Special Requests NONE  Final   Culture    Final    NO GROWTH 5 DAYS Performed at [facility], [address], [place], [place] [address]    Report Status 12/05/2018 FINAL  Final  Gram stain     Status: None   Collection Time: 11/30/18 10:56 AM  Result Value Ref Range Status   Specimen Description PLEURAL LEFT  Final   Special Requests NONE  Final   Gram Stain   Final    RARE WBC PRESENT,BOTH PMN AND MONONUCLEAR NO ORGANISMS SEEN Performed at [facility], [place] [address], [place], [place] [address]    Report Status 12/01/2018 FINAL  Final         Radiology Studies: Dg Chest Port 1 View  Result Date: 12/05/2018 CLINICAL DATA:  Pleural effusion. EXAM: PORTABLE CHEST 1 VIEW COMPARISON:  CT scan and radiograph of November 30, 2018. FINDINGS: Stable near complete opacification of left hemithorax is noted, with stable aerated lung seen in the left lower lobe. This is most consistent with pneumonia or atelectasis with associated pleural effusion. Mild mediastinal shift from right to left is noted. Atherosclerosis of thoracic aorta is noted. No pneumothorax is noted. Bony thorax is unremarkable. IMPRESSION: Stable left lung findings as described above. Aortic Atherosclerosis (ICD10-I70.0). Electronically Signed   By: Marijo Conception M.D.   On: 12/05/2018 07:41        Scheduled Meds:  sodium chloride   Intravenous Once   aspirin EC  81 mg Oral Daily   docusate sodium  100 mg Oral BID   enoxaparin (LOVENOX) injection  40 mg Subcutaneous Q24H   ferrous sulfate  325 mg Oral TID WC   levothyroxine  112 mcg Oral QAC breakfast   metoprolol succinate  25 mg Oral Daily   nicotine  14 mg Transdermal Daily   pantoprazole  40 mg Oral QHS   umeclidinium-vilanterol  1 puff Inhalation Daily   Continuous Infusions:  ampicillin-sulbactam (UNASYN) IV 3 g (12/05/18 0923)     LOS: 4 days    Time spent: 73 min    Nicolette Bang, MD [facility]  If 7PM-7AM, please contact  night-coverage  12/05/2018, 12:48 PM

## 2018-12-05 NOTE — Progress Notes (Signed)
Palliative Note:  Patient awake, awake, and oriented x3. Denied pain or shortness of breath. Reports he feels better today compared to yesterday. States he feels the blood transfusion has helped.   He states he is hopeful he can get home soon. He feels his friends and neighbors may be of good support to him.   We reviewed his goals of care. He expressed he is going to give chemo a try but if he gets "really sick" he isn't going to continue. He states "if I am going to die, it isn't going to be because of the chemo!" Support given. I reminded him again that transportation was being set-up and follow-up appointments with Oncology. He verbalized understanding and appreciation of support during this hospitalization. He is aware DNR form is on chart to be given for home use at discharge.   Assessment-Awake, A&O x3, ankle bracelet (house arrest), S1S2, lungs clear, diminished bases  Plan- -DNR, home with outpatient palliative support, continue to treat the treatable  Total Time: 49min  Greater than 50%  of this time was spent counseling and coordinating care related to the above assessment and plan  Alda Lea, AGPCNP-BC Palliative Medicine Team  Pager: [phone] Amion: N. [place]

## 2018-12-05 NOTE — Progress Notes (Signed)
Occupational Therapy Treatment Patient Details Name: Brad [name] MRN: [id number] DOB: 01/27/1956 Today's Date: 12/05/2018    History of present illness Pt is a 63 y.o. male admitted 11/29/18 with generalized weakness; likely multifactorial due to sedentary, overdiuresis last admission and recurrent pleural effusion. S/p L-side thoracentesis 4/25. Of note, recent admission from jail 4/15-4/22 with likely CHF exacerbation; pt was reportedly d/c home on house arrest but unable to move. 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   OT comments  Pt attempting to get OOB on OT arrival.  Pt in a hurry to get to bathroom and demonstrated poor safety awareness- refusing to use RW although he is very unsteady and unaware of IV pole attached to him. Pt completed toileting and grooming tasks with min guard for safety.  Left pt in recliner to work with PT. Continue to recommend SNF for d/c planning. Will continue to follow acutely.   Follow Up Recommendations  SNF;Supervision/Assistance - 24 hour    Equipment Recommendations  3 in 1 bedside commode    Recommendations for Other Services      Precautions / Restrictions Precautions Precautions: Fall Restrictions Weight Bearing Restrictions: No       Mobility Bed Mobility Overal bed mobility: (pt sitting EOB on OT arrival)             General bed mobility comments: sitting on EoB on entry  Transfers Overall transfer level: Needs assistance Equipment used: Rolling walker (2 wheeled) Transfers: Sit to/from Stand Sit to Stand: Min guard         General transfer comment: Close guarding for safety due to refusal of RW use and poor awareness of IV pole.    Balance Overall balance assessment: Needs assistance Sitting-balance support: No upper extremity supported;Feet supported Sitting balance-Leahy Scale: Good       Standing balance-Leahy Scale: Fair Standing balance  comment: Able to static stand without UE support minor LoB but able to steady himself                           ADL either performed or assessed with clinical judgement   ADL Overall ADL's : Needs assistance/impaired     Grooming: Wash/dry hands;Wash/dry face;Standing;Min guard Grooming Details (indicate cue type and reason): Verbal cues to locate soap (attempting to open lotion and then deodorant).                  Toilet Transfer: Min guard;Ambulation;Regular Toilet;Grab bars   Toileting- Clothing Manipulation and Hygiene: Min guard;Sit to/from stand       Functional mobility during ADLs: Min guard General ADL Comments: Pt refusing use of RW and ambulating with unsteadiness (staggering in all directions). Pt demonstrates poor awareness of IV pole and does not attempt to manage it while ambulating to bathroom.  Pt washed hands and face at sink but refused to brush teeth.     Vision       Perception     Praxis      Cognition Arousal/Alertness: Awake/alert Behavior During Therapy: Flat affect;Impulsive Overall Cognitive Status: No family/caregiver present to determine baseline cognitive functioning                                 General Comments: decreased safety awareness and very impulsive with movement  Exercises   Shoulder Instructions       General Comments SoB with movement     Pertinent Vitals/ Pain       Pain Assessment: Faces Faces Pain Scale: Hurts little more Pain Location: generalized with movement Pain Descriptors / Indicators: Grimacing;Moaning Pain Intervention(s): Monitored during session;Limited activity within patient's tolerance  Home Living                                          Prior Functioning/Environment              Frequency  Min 2X/week        Progress Toward Goals  OT Goals(current goals can now be found in the care plan section)  Progress towards OT goals:  Progressing toward goals  Acute Rehab OT Goals Patient Stated Goal: Pt unsure about SNF stating, "Let's see how I feel in a few days" OT Goal Formulation: With patient Time For Goal Achievement: 12/15/18 Potential to Achieve Goals: Fair ADL Goals Pt Will Perform Grooming: with modified independence;standing Pt Will Perform Upper Body Dressing: with modified independence;sitting Pt Will Perform Lower Body Dressing: with modified independence;sit to/from stand Pt Will Transfer to Toilet: with modified independence;ambulating Pt Will Perform Toileting - Clothing Manipulation and hygiene: with modified independence;sit to/from stand Additional ADL Goal #1: Pt will recall at least 3 ways of conserving energy during ADL routine at independent level  Plan Discharge plan remains appropriate;Frequency remains appropriate    Co-evaluation                 AM-PAC OT "6 Clicks" Daily Activity     Outcome Measure   Help from another person eating meals?: None Help from another person taking care of personal grooming?: A Little Help from another person toileting, which includes using toliet, bedpan, or urinal?: A Little Help from another person bathing (including washing, rinsing, drying)?: A Little Help from another person to put on and taking off regular upper body clothing?: None Help from another person to put on and taking off regular lower body clothing?: A Little 6 Click Score: 20    End of Session    OT Visit Diagnosis: Unsteadiness on feet (R26.81);Other abnormalities of gait and mobility (R26.89);Muscle weakness (generalized) (M62.81);Adult, failure to thrive (R62.7)   Activity Tolerance Patient tolerated treatment well   Patient Left in chair(with PT )   Nurse Communication Mobility status        Time: 2683-4196 OT Time Calculation (min): 18 min  Charges: OT General Charges $OT Visit: 1 Visit OT Treatments $Self Care/Home Management : 8-22 mins   Darrol Jump OTR/L [place] [phone] 12/05/2018, 3:32 PM

## 2018-12-06 ENCOUNTER — Encounter (HOSPITAL_COMMUNITY): Payer: Self-pay | Admitting: Radiology

## 2018-12-06 ENCOUNTER — Inpatient Hospital Stay (HOSPITAL_COMMUNITY): Payer: Medicare Other

## 2018-12-06 HISTORY — PX: IR THORACENTESIS ASP PLEURAL SPACE W/IMG GUIDE: IMG5380

## 2018-12-06 LAB — HEMOGLOBIN AND HEMATOCRIT, BLOOD
HCT: 22.5 % — ABNORMAL LOW (ref 39.0–52.0)
HCT: 24.3 % — ABNORMAL LOW (ref 39.0–52.0)
Hemoglobin: 6.9 g/dL — CL (ref 13.0–17.0)
Hemoglobin: 7.6 g/dL — ABNORMAL LOW (ref 13.0–17.0)

## 2018-12-06 LAB — GLUCOSE, CAPILLARY
Glucose-Capillary: 103 mg/dL — ABNORMAL HIGH (ref 70–99)
Glucose-Capillary: 159 mg/dL — ABNORMAL HIGH (ref 70–99)
Glucose-Capillary: 193 mg/dL — ABNORMAL HIGH (ref 70–99)

## 2018-12-06 LAB — PREPARE RBC (CROSSMATCH)

## 2018-12-06 MED ORDER — SODIUM CHLORIDE 0.9% IV SOLUTION
Freq: Once | INTRAVENOUS | Status: AC
  Administered 2018-12-06: 12:00:00 via INTRAVENOUS

## 2018-12-06 MED ORDER — LIDOCAINE HCL 1 % IJ SOLN
INTRAMUSCULAR | Status: AC
  Filled 2018-12-06: qty 20

## 2018-12-06 MED ORDER — LIDOCAINE HCL 1 % IJ SOLN
INTRAMUSCULAR | Status: DC | PRN
  Administered 2018-12-06: 10 mL

## 2018-12-06 NOTE — Progress Notes (Signed)
PROGRESS NOTE    CAMARION WEIER  GHW:[id number] DOB: February 04, 1956 DOA: 11/29/2018 PCP: Biagio Borg, MD   Brief Narrative:  63 y.o.malewith medical history significant ofCVA; stage 3 SCC of the LUL s/phemoradiation and incomplete chemotherapy; chronic combined CHF (EF 45-50% with grade 2 DD in 2/17); hypothyoiridsm; HTN; HLD; DM; chronic hep C; and COPD presenting with weakness. Since hospital discharge, he was placed on his front porch and he wasn't able to move. He was unable to get up and go to the bathroom. His legs are just too weak/ Denies SOB. No cough. Regarding his cancer, was last seen by oncology in February 2019 then lost to follow-up. He was admitted from 4/15-22 from jail (incarcerated since 7/89/38BOF uncertain reason; prior conviction on 12/02/12 for taking indecent liberties with a child) with pleural effusion, negative for malignancy, thought to be due to CHF exacerbation. He was diuresed 18L during the hospitalization. He has h/o GI bleeding and anemia of chronic disease and his Hgb was as low as 6.6, requiring transfusion of 1 unit PRBC.  ED Course:Recently discharged to home from jail. Chronically elevated troponin, stable. Not clearly in CHF. No hypoxia. Had thoracentesis during prior hospitalization, now opacified. Too weak to go home, tachypnea with reaccummulation of fluid after last hospitalization   Assessment & Plan:   Principal Problem:   Weakness Active Problems:   Diabetes ([place])   Hypothyroidism   Tobacco abuse   Essential hypertension   HLD (hyperlipidemia)   Pleural effusion   Stage III squamous cell carcinoma of left lung (HCC)   AKI (acute kidney injury) (HCC)   Chronic combined systolic and diastolic CHF (congestive heart failure) ([place])   Widely metastatic left lung cancer,as previously noted,CT scan 4/25 shows cancer worsening,"Large centrally necrotic mass in the left upper lobe, likely toreflect treated neoplasm. Given  the appearance of this lesion on today's examination, this is favored to reflect a large pulmonary abscess at this time. This is associated with a moderate loculated left pleural effusion, largest component of which is subpulmonic in position and also with new 2.8 x 1.8 cm RLL pulm nodule, multiple right adrenal nodules and masses, and new lytic lesions in the left side of the manubrium and the right S1 vertebra".Patient was lost to follow-up was on concurrent chemoradiation in 2019 February. Oncology has been consultedand patient has agreed for follow-up as an outpatient in 1 to 2 weeks. Oncology is helping assist with transportation for that follow-up.Palliativecare met with the patient and he indicated he would not want to do chemotherapy also so it is unclear if a follow-up appoint will have much significance in terms of his outcome.Continue on UnasynFOR AN ADDITIONAL 1 DAY  Large loculated left pleural effusionstatus post thoracentesis 1.9 L fluid removal, suspect malignant effusion. Gram stain culture no growth, fluid appears exudative.Cytology negative for malignant cells. He isafebrile , blood cultures no growth so far. On unasyn for now-see above.Seen by CT surgery advised intermittent thoracentesis, Pleurx catheter for comfort if recurrent, cxr still with opacification, repeated thoracentesis May 1 with 800 cc of fluid.  Discussed plan of care with patient he agreed to meet with palliative today to discuss options with hospice  Generalized Weakness: Multifactorial in the setting of metastatic cancer, anemia. PT OT evaluationappreciated, patient appears to be resistant to skilled nursing facility although would benefit, there is difficulty getting home health agencies because of his sex offender status, discussed with treatment team who are evaluating additional options-still somewhat limited  Chronic combined systolic  and diastolic CHF: TTE lvef 40-08%, severe concentric  LVH, severe LAE moderate RA and concern for infiltrative process on recent echo. compensated. Patient overdiuresied on recent admission.cont Holding diuretics for now  COPD,stable continue bronchodilators.  Acute anemia of chronic disease/malignancywith hemoglobin6.9g. Continue iron supplementation, transfused for hemoglobin less than 7.With 1 unit ordered for today  Prediabetes:Hha1c 6.2 stable. Controlled. Monitor CBG  Hypothyroidismon levothyroxinenormal dysregulation  Tobacco abuse: Cessation advised  Essential hypertension: Stable on metoprolol  AKI: creat 1.35., recheck labs in the morning  History of CVA/PVD/Carotid endarterectomy, on aspirin  History of duodenal ulcer- cont ppi  History of chronic hep C.No acute intervention currently monitor LFTs as needed  Recently discharged from jail. Has a monitoring device on 1 of his legs  Noncompliance/poor follow-up:Patient has agreed to follow-up with oncology although with his lack of interest in doing chemotherapy this will be of questionable benefit  DVT prophylaxis: Lovenox SQ  Code Status: DNR    Code Status Orders  (From admission, onward)         Start     Ordered   12/03/18 1231  Do not attempt resuscitation (DNR)  Continuous    Question Answer Comment  In the event of cardiac or respiratory ARREST Do not call a code blue   In the event of cardiac or respiratory ARREST Do not perform Intubation, CPR, defibrillation or ACLS   In the event of cardiac or respiratory ARREST Use medication by any route, position, wound care, and other measures to relive pain and suffering. May use oxygen, suction and manual treatment of airway obstruction as needed for comfort.      12/03/18 1231        Code Status History    Date Active Date Inactive Code Status Order ID Comments User Context   11/30/2018 0818 12/03/2018 1231 Full Code [id number]  Karmen Bongo, MD ED   11/20/2018 2224 11/27/2018 1517  Full Code [id number]  Lenore Cordia, MD ED   12/08/2015 1011 12/09/2015 1546 Full Code [id number]  Rondel Jumbo, PA-C ED   09/27/2015 1824 09/29/2015 2201 Full Code [id number]  Charlynne Cousins, MD Inpatient   11/13/2014 1245 11/14/2014 1138 Full Code [id number]  Serafina Mitchell, MD Inpatient   09/23/2014 1223 09/25/2014 1557 Full Code [id number]  Kelvin Cellar, MD ED   02/28/2014 1725 03/01/2014 1908 Full Code [id number]  Modena Jansky, MD Inpatient     Family Communication: Byphone Disposition Plan:   Skilled nursing facility versus home with home health if able to locate an agency. Patient will need to remain in the hospital inpatient for continued IV antibiotics, as well as MONITORING OF HGB 2/2 ANEMIA REQUIRING TRANSFUSION. He risks hemodynamic collapse without these treatments.  Additionally patient require supplemental O2 with frequent monitoring secondary to reaccumulation of effusion. Consults called: IR, palliative care, oncology Admission status: Inpatient   Consultants:   As above  Procedures:  Dg Chest 1 View  Result Date: 12/06/2018 CLINICAL DATA:  Followup left thoracentesis EXAM: CHEST  1 VIEW COMPARISON:  12/05/2018 FINDINGS: Thoracentesis on the left by history. There continues to be extensive opacity within the left hemithorax. There is some aerated lung in the lower chest. The degree of aeration of the lower lung is probably improved. There is no evidence of pneumothorax. Right chest remains clear. IMPRESSION: Some improvement in aeration of the left lower lung following left thoracentesis. No pneumothorax. Electronically Signed   By: Nelson Chimes M.D.   On: 12/06/2018 10:56  Dg Chest 1 View  Result Date: 11/30/2018 CLINICAL DATA:  Status post left thoracentesis. EXAM: CHEST  1 VIEW COMPARISON:  November 30, 2018 FINDINGS: A significant left-sided pleural effusion remains. No pneumothorax after thoracentesis. The right lung is clear. IMPRESSION: Significant effusion and  underlying opacity remains on the left. However, no pneumothorax is seen after left-sided thoracentesis. Electronically Signed   By: Dorise Bullion III M.D   On: 11/30/2018 11:28   Dg Chest 1 View  Result Date: 11/21/2018 CLINICAL DATA:  Follow-up thoracentesis EXAM: CHEST  1 VIEW COMPARISON:  11/20/2018 FINDINGS: Right chest remains clear. Opacification of the left lung appears progressive. The amount of pleural density appears similar. No evidence of pneumothorax. IMPRESSION: Similar appearance of pleural density on the left. Worsened opacity of the left lung. Electronically Signed   By: Nelson Chimes M.D.   On: 11/21/2018 12:31   Dg Chest 2 View  Result Date: 11/30/2018 CLINICAL DATA:  Shortness of breath EXAM: CHEST - 2 VIEW COMPARISON:  11/21/2018 FINDINGS: Continued near complete opacification of the left hemithorax with left pleural density and airspace disease. Right lung clear. There is cardiomegaly. No acute bony abnormality. IMPRESSION: Continued near complete opacification of the left hemithorax with diffuse left lung airspace disease and left pleural density, presumably effusion. Electronically Signed   By: Rolm Baptise M.D.   On: 11/30/2018 02:27   Dg Chest 2 View  Result Date: 11/20/2018 CLINICAL DATA:  Chest pain EXAM: CHEST - 2 VIEW COMPARISON:  06/26/2017 FINDINGS: Large left pleural effusion with basilar and apical components. Approximately 50% volume loss within the left lung. Left chest surgical clips. Right lung is clear. IMPRESSION: Large left pleural effusion with basilar and apical components with moderate left lung volume loss. Electronically Signed   By: Ulyses Jarred M.D.   On: 11/20/2018 17:51   Ct Chest W Contrast  Result Date: 11/30/2018 CLINICAL DATA:  63 year old male with history of non-small cell lung cancer. Staging examination. EXAM: CT CHEST, ABDOMEN, AND PELVIS WITH CONTRAST TECHNIQUE: Multidetector CT imaging of the chest, abdomen and pelvis was performed  following the standard protocol during bolus administration of intravenous contrast. CONTRAST:  3m OMNIPAQUE IOHEXOL 300 MG/ML  SOLN COMPARISON:  PET-CT 04/25/2017. FINDINGS: CT CHEST FINDINGS Cardiovascular: Heart size is mildly enlarged. There is no significant pericardial fluid, thickening or pericardial calcification. There is aortic atherosclerosis, as well as atherosclerosis of the great vessels of the mediastinum and the coronary arteries, including calcified atherosclerotic plaque in the left main, left anterior descending, left circumflex and right coronary arteries. Mediastinum/Nodes: No pathologically enlarged mediastinal or hilar lymph nodes. Esophagus is unremarkable in appearance. No axillary lymphadenopathy. Lungs/Pleura: Previously noted left upper lobe nodule is no longer clearly identifiable. Several fiducial markers are noted in the left upper lobe. On today's examination there is a new 7.8 x 9.5 x 8.3 cm mass-like area (axial image 21 of series 4 and coronal image 105 of series 6) which is centrally hypovascular with multiple internal locules of gas, favored to represent a large centrally necrotic mass, likely with superinfection (i.e., a large pulmonary abscess) with some adjacent postobstructive changes in the remaining portions of the left upper lobe. This is associated with outward bowing of the left major fissure. Some consolidative changes in volume loss are noted in the anterior and lateral aspect of the left lower lobe. Moderate left pleural effusion predominantly loculated in a subpulmonic position. New right lower lobe pulmonary nodule (axial image 131 of series 5) measuring 2.8 x  1.8 cm, concerning for metastatic lesion. No right pleural effusion. Musculoskeletal: Lytic lesion in the left side of the manubrium measuring 2.5 x 1.7 cm (axial image 13 of series 4). CT ABDOMEN PELVIS FINDINGS Hepatobiliary: No suspicious cystic or solid hepatic lesions. No intra or extrahepatic biliary  ductal dilatation. Gallbladder is unremarkable in appearance. Pancreas: No pancreatic mass. No pancreatic ductal dilatation. No pancreatic or peripancreatic fluid or inflammatory changes. Spleen: Unremarkable. Adrenals/Urinary Tract: Multiple new right adrenal nodules and masses, compatible with metastatic disease, largest of which measures 2.8 x 3.8 cm (axial image 71 of series 4). Left adrenal gland is normal in appearance. Left kidney is normal in appearance. Multiple low-attenuation lesions in the right kidney, compatible with simple cysts measuring up to 4.2 cm in the interpolar region. In the lower pole of the right kidney there is also an exophytic intermediate attenuation lesion which is incompletely characterized, but similar to prior PET-CT 04/25/2017, favored to represent a mildly proteinaceous/hemorrhagic cyst. No hydroureteronephrosis. Urinary bladder is normal in appearance. Stomach/Bowel: Normal appearance of the stomach. No pathologic dilatation of small bowel or colon. Numerous colonic diverticulae are noted, particularly in the proximal sigmoid colon, without surrounding inflammatory changes to suggest an acute diverticulitis at this time. Normal appendix. Vascular/Lymphatic: Aortic atherosclerosis, without evidence of aneurysm or dissection in the abdominal or pelvic vasculature. No lymphadenopathy noted in the abdomen or pelvis. Reproductive: Prostate gland and seminal vesicles are unremarkable in appearance. Other: No significant volume of ascites.  No pneumoperitoneum. Musculoskeletal: Large lytic lesion in the right side of the S1 vertebra measuring 3.2 x 4.2 cm (axial image 97 of series 4). IMPRESSION: 1. Large centrally necrotic mass in the left upper lobe, likely to reflect treated neoplasm. Given the appearance of this lesion on today's examination, this is favored to reflect a large pulmonary abscess at this time. This is associated with a moderate loculated left pleural effusion, largest  component of which is subpulmonic in position. 2. New 2.8 x 1.8 cm right lower lobe pulmonary nodule, multiple right adrenal nodules and masses, and new lytic lesions in the left side of the manubrium and the right S1 vertebra, compatible with widespread metastatic disease. 3. Colonic diverticulosis without evidence of acute diverticulitis at this time. 4. Aortic atherosclerosis, in addition to left main and 3 vessel coronary artery disease. Please note that although the presence of coronary artery calcium documents the presence of coronary artery disease, the severity of this disease and any potential stenosis cannot be assessed on this non-gated CT examination. Assessment for potential risk factor modification, dietary therapy or pharmacologic therapy may be warranted, if clinically indicated. 5. Additional incidental findings, as above. Electronically Signed   By: Vinnie Langton M.D.   On: 11/30/2018 12:02   Ct Abdomen Pelvis W Contrast  Result Date: 11/30/2018 CLINICAL DATA:  63 year old male with history of non-small cell lung cancer. Staging examination. EXAM: CT CHEST, ABDOMEN, AND PELVIS WITH CONTRAST TECHNIQUE: Multidetector CT imaging of the chest, abdomen and pelvis was performed following the standard protocol during bolus administration of intravenous contrast. CONTRAST:  22m OMNIPAQUE IOHEXOL 300 MG/ML  SOLN COMPARISON:  PET-CT 04/25/2017. FINDINGS: CT CHEST FINDINGS Cardiovascular: Heart size is mildly enlarged. There is no significant pericardial fluid, thickening or pericardial calcification. There is aortic atherosclerosis, as well as atherosclerosis of the great vessels of the mediastinum and the coronary arteries, including calcified atherosclerotic plaque in the left main, left anterior descending, left circumflex and right coronary arteries. Mediastinum/Nodes: No pathologically enlarged mediastinal or hilar  lymph nodes. Esophagus is unremarkable in appearance. No axillary  lymphadenopathy. Lungs/Pleura: Previously noted left upper lobe nodule is no longer clearly identifiable. Several fiducial markers are noted in the left upper lobe. On today's examination there is a new 7.8 x 9.5 x 8.3 cm mass-like area (axial image 21 of series 4 and coronal image 105 of series 6) which is centrally hypovascular with multiple internal locules of gas, favored to represent a large centrally necrotic mass, likely with superinfection (i.e., a large pulmonary abscess) with some adjacent postobstructive changes in the remaining portions of the left upper lobe. This is associated with outward bowing of the left major fissure. Some consolidative changes in volume loss are noted in the anterior and lateral aspect of the left lower lobe. Moderate left pleural effusion predominantly loculated in a subpulmonic position. New right lower lobe pulmonary nodule (axial image 131 of series 5) measuring 2.8 x 1.8 cm, concerning for metastatic lesion. No right pleural effusion. Musculoskeletal: Lytic lesion in the left side of the manubrium measuring 2.5 x 1.7 cm (axial image 13 of series 4). CT ABDOMEN PELVIS FINDINGS Hepatobiliary: No suspicious cystic or solid hepatic lesions. No intra or extrahepatic biliary ductal dilatation. Gallbladder is unremarkable in appearance. Pancreas: No pancreatic mass. No pancreatic ductal dilatation. No pancreatic or peripancreatic fluid or inflammatory changes. Spleen: Unremarkable. Adrenals/Urinary Tract: Multiple new right adrenal nodules and masses, compatible with metastatic disease, largest of which measures 2.8 x 3.8 cm (axial image 71 of series 4). Left adrenal gland is normal in appearance. Left kidney is normal in appearance. Multiple low-attenuation lesions in the right kidney, compatible with simple cysts measuring up to 4.2 cm in the interpolar region. In the lower pole of the right kidney there is also an exophytic intermediate attenuation lesion which is incompletely  characterized, but similar to prior PET-CT 04/25/2017, favored to represent a mildly proteinaceous/hemorrhagic cyst. No hydroureteronephrosis. Urinary bladder is normal in appearance. Stomach/Bowel: Normal appearance of the stomach. No pathologic dilatation of small bowel or colon. Numerous colonic diverticulae are noted, particularly in the proximal sigmoid colon, without surrounding inflammatory changes to suggest an acute diverticulitis at this time. Normal appendix. Vascular/Lymphatic: Aortic atherosclerosis, without evidence of aneurysm or dissection in the abdominal or pelvic vasculature. No lymphadenopathy noted in the abdomen or pelvis. Reproductive: Prostate gland and seminal vesicles are unremarkable in appearance. Other: No significant volume of ascites.  No pneumoperitoneum. Musculoskeletal: Large lytic lesion in the right side of the S1 vertebra measuring 3.2 x 4.2 cm (axial image 97 of series 4). IMPRESSION: 1. Large centrally necrotic mass in the left upper lobe, likely to reflect treated neoplasm. Given the appearance of this lesion on today's examination, this is favored to reflect a large pulmonary abscess at this time. This is associated with a moderate loculated left pleural effusion, largest component of which is subpulmonic in position. 2. New 2.8 x 1.8 cm right lower lobe pulmonary nodule, multiple right adrenal nodules and masses, and new lytic lesions in the left side of the manubrium and the right S1 vertebra, compatible with widespread metastatic disease. 3. Colonic diverticulosis without evidence of acute diverticulitis at this time. 4. Aortic atherosclerosis, in addition to left main and 3 vessel coronary artery disease. Please note that although the presence of coronary artery calcium documents the presence of coronary artery disease, the severity of this disease and any potential stenosis cannot be assessed on this non-gated CT examination. Assessment for potential risk factor  modification, dietary therapy or pharmacologic therapy may  be warranted, if clinically indicated. 5. Additional incidental findings, as above. Electronically Signed   By: Vinnie Langton M.D.   On: 11/30/2018 12:02   Dg Chest Port 1 View  Result Date: 12/05/2018 CLINICAL DATA:  Pleural effusion. EXAM: PORTABLE CHEST 1 VIEW COMPARISON:  CT scan and radiograph of November 30, 2018. FINDINGS: Stable near complete opacification of left hemithorax is noted, with stable aerated lung seen in the left lower lobe. This is most consistent with pneumonia or atelectasis with associated pleural effusion. Mild mediastinal shift from right to left is noted. Atherosclerosis of thoracic aorta is noted. No pneumothorax is noted. Bony thorax is unremarkable. IMPRESSION: Stable left lung findings as described above. Aortic Atherosclerosis (ICD10-I70.0). Electronically Signed   By: Marijo Conception M.D.   On: 12/05/2018 07:41   Nm Scan Tumor Localize With Spect  Result Date: 11/25/2018 CLINICAL DATA:  HEART FAILURE. CONCERN FOR CARDIAC AMYLOIDOSIS. EXAM: NUCLEAR MEDICINE TUMOR LOCALIZATION. PYP CARDIAC AMYLOIDOSIS SCAN WITH SPECT TECHNIQUE: Following intravenous administration of radiopharmaceutical, anterior planar images of the chest were obtained. Regions of interest were placed on the heart and contralateral chest wall for quantitative assessment. Additional SPECT imaging of the chest was obtained. RADIOPHARMACEUTICALS:  21.7 mCi TECHNETIUM 99 PYROPHOSPHATE FINDINGS: Planar Visual assessment: Anterior planar imaging demonstrates radiotracer uptake within the heart less than than uptake within the adjacent ribs (Grade 1). Quantitative assessment : Quantitative assessment of the cardiac uptake compared to the contralateral chest wall is equal to 1.0 (H/CL = 1.0). SPECT assessment: SPECT imaging of the chest demonstrates minimal radiotracer accumulation within the LEFT ventricle. IMPRESSION: Visual and quantitative assessment  (grade 1, H/CLL equal 1.0) are NOT suggestive of transthyretin amyloidosis. Electronically Signed   By: Suzy Bouchard M.D.   On: 11/25/2018 14:33   Ir Thoracentesis Asp Pleural Space W/img Guide  Result Date: 12/06/2018 INDICATION: History of lung cancer. Recurrent left-sided pleural effusion. Request therapeutic thoracentesis. EXAM: ULTRASOUND GUIDED LEFT THORACENTESIS MEDICATIONS: None. COMPLICATIONS: None immediate. PROCEDURE: An ultrasound guided thoracentesis was thoroughly discussed with the patient and questions answered. The benefits, risks, alternatives and complications were also discussed. The patient understands and wishes to proceed with the procedure. Written consent was obtained. Ultrasound of the left chest demonstrates that the effusion has become severely loculated in the interval since previous procedure. Ultrasound was performed to localize and mark an adequate pocket of fluid in the left chest. The area was then prepped and draped in the normal sterile fashion. 1% Lidocaine was used for local anesthesia. Under ultrasound guidance a 6 Fr Safe-T-Centesis catheter was introduced. Thoracentesis was performed. The catheter was removed and a dressing applied. FINDINGS: A total of approximately 800 mL of clear, dark yellow fluid was removed. IMPRESSION: Successful ultrasound guided left thoracentesis yielding 800 mL of pleural fluid. Left pleural effusion has become markedly loculated in the interval. Read by: Ascencion Dike PA-C Electronically Signed   By: Markus Daft M.D.   On: 12/06/2018 10:40   Ir Thoracentesis Asp Pleural Space W/img Guide  Result Date: 11/21/2018 INDICATION: Patient with history of lung cancer, now with pleural effusion. Request is made for diagnostic and therapeutic left thoracentesis. EXAM: ULTRASOUND GUIDED DIAGNOSTIC AND THERAPEUTIC LEFT THORACENTESIS MEDICATIONS: 10 mL 1% lidocaine COMPLICATIONS: None immediate. PROCEDURE: An ultrasound guided thoracentesis was  thoroughly discussed with the patient and questions answered. The benefits, risks, alternatives and complications were also discussed. The patient understands and wishes to proceed with the procedure. Written consent was obtained. Ultrasound was performed to localize and mark  an adequate pocket of fluid in the left chest. The area was then prepped and draped in the normal sterile fashion. 1% Lidocaine was used for local anesthesia. Under ultrasound guidance a 6 Fr Safe-T-Centesis catheter was introduced. Thoracentesis was performed. The catheter was removed and a dressing applied. FINDINGS: A total of approximately 1.3 liters of yellow fluid was removed. Samples were sent to the laboratory as requested by the clinical team. IMPRESSION: Successful ultrasound guided diagnostic and therapeutic left thoracentesis yielding 1.3 liters of pleural fluid. Read by: Brynda Greathouse PA-C Electronically Signed   By: Markus Daft M.D.   On: 11/21/2018 12:50   US Thoracentesis Asp Pleural Space W/img Guide  Result Date: 11/30/2018 INDICATION: Patient with history of stage 3 SCC of the left upper lobe s/p radiation and incomplete chemotherapy, COPD, CHF who presented to [facility] ED this morning with complaints of weakness and dyspnea. Noted to have recurrent left pleural effusion - last thoracentesis 11/21/18 yielding 1.3 L pleural fluid. Request has been made to IR for diagnostic and therapeutic thoracentesis. EXAM: ULTRASOUND GUIDED LEFT THORACENTESIS MEDICATIONS: 8 mL 1% lidocaine. COMPLICATIONS: None immediate. PROCEDURE: An ultrasound guided thoracentesis was thoroughly discussed with the patient and questions answered. The benefits, risks, alternatives and complications were also discussed. The patient understands and wishes to proceed with the procedure. Written consent was obtained. Ultrasound was performed to localize and mark an adequate pocket of fluid in the left chest. The area was then prepped and draped in the normal  sterile fashion. 1% Lidocaine was used for local anesthesia. Under ultrasound guidance a 6 Fr Safe-T-Centesis catheter was introduced. Thoracentesis was performed. The catheter was removed and a dressing applied. FINDINGS: A total of approximately 1.9 L of golden yellow fluid was removed. Procedure was aborted due to patient complaints of chest pain - some residual fluid remains on post procedure ultrasound. samples were sent to the laboratory as requested by the clinical team. IMPRESSION: Successful ultrasound guided left thoracentesis yielding 1.9 L of pleural fluid. Read by Candiss Norse, PA-C Electronically Signed   By: Aletta Edouard M.D.   On: 11/30/2018 10:51     Antimicrobials:   Unasyn day 7   Subjective: Patient visibly more short of breath today.  Underwent thoracentesis with removal of 800 cc.  Reported some relief Patient discussed palliative reconsultation with evaluation for possible hospice placement  Objective: Vitals:   12/06/18 0742 12/06/18 0925 12/06/18 1132 12/06/18 1147  BP: (!) 182/59 (!) 161/54 (!) 181/63 (!) 171/62  Pulse: 61 66 (!) 59 61  Resp: 18  19 (!) 22  Temp: 98 F (36.7 C)  97.7 F (36.5 C) 98.1 F (36.7 C)  TempSrc: Oral  Oral Oral  SpO2: 93%  99% 99%  Weight:      Height:        Intake/Output Summary (Last 24 hours) at 12/06/2018 1351 Last data filed at 12/06/2018 1147 Gross per 24 hour  Intake 1470 ml  Output 3300 ml  Net -1830 ml   Filed Weights   12/04/18 0636 12/05/18 0500 12/06/18 0700  Weight: 90 kg 90.4 kg 90.4 kg    Examination:  General exam: Appears calm but visibly short of breath Respiratory system: Decreased breath sounds left side, rales left greater than right. Cardiovascular system: S1 & S2 heard, RRR. No JVD, murmurs, rubs, gallops or clicks. No pedal edema. Gastrointestinal system: Abdomen is nondistended, soft and nontender. No organomegaly or masses felt. Normal bowel sounds heard. Central nervous system: Alert  and oriented.  No focal neurological deficits. Extremities: Symmetric 5 x 5 power.  Warm well perfused Skin: No rashes, lesions or ulcers Psychiatry: Judgement and insight appear normal. Mood & affect are flat    Data Reviewed: I have personally reviewed following labs and imaging studies  CBC: Recent Labs  Lab 12/01/18 0306 12/02/18 0420 12/03/18 0337 12/04/18 0227 12/05/18 0339 12/06/18 0314  WBC 6.7 9.5 8.2 6.1 7.0  --   NEUTROABS  --   --   --   --  5.6  --   HGB 7.0* 7.3* 7.1* 6.1* 7.1* 6.9*  HCT 23.9* 24.2* 23.4* 20.6* 23.2* 22.5*  MCV 78.6* 77.6* 78.3* 77.7* 79.5*  --   PLT 281 296 295 262 284  --    Basic Metabolic Panel: Recent Labs  Lab 12/01/18 0306 12/02/18 0420 12/03/18 0337 12/04/18 0227 12/05/18 0339  NA 138 135 135 136 136  K 3.8 3.8 4.0 4.2 4.1  CL 103 102 102 103 103  CO2 27 24 23 23 25   GLUCOSE 104* 117* 171* 105* 100*  BUN 20 22 24* 25* 26*  CREATININE 1.17 1.39* 1.56* 1.30* 1.35*  CALCIUM 8.2* 8.1* 8.0* 8.0* 8.2*   GFR: Estimated Creatinine Clearance: 66.1 mL/min (A) (by C-G formula based on SCr of 1.35 mg/dL (H)). Liver Function Tests: Recent Labs  Lab 12/05/18 0339  AST 28  ALT 21  ALKPHOS 173*  BILITOT 0.6  PROT 6.0*  ALBUMIN 1.4*   No results for input(s): LIPASE, AMYLASE in the last 168 hours. No results for input(s): AMMONIA in the last 168 hours. Coagulation Profile: No results for input(s): INR, PROTIME in the last 168 hours. Cardiac Enzymes: Recent Labs  Lab 11/30/18 0018 11/30/18 0359  TROPONINI 0.06* 0.06*   BNP (last 3 results) No results for input(s): PROBNP in the last 8760 hours. HbA1C: No results for input(s): HGBA1C in the last 72 hours. CBG: Recent Labs  Lab 12/04/18 0744 12/04/18 1215 12/04/18 1702 12/06/18 0749 12/06/18 1154  GLUCAP 87 128* 115* 103* 159*   Lipid Profile: No results for input(s): CHOL, HDL, LDLCALC, TRIG, CHOLHDL, LDLDIRECT in the last 72 hours. Thyroid Function Tests: No  results for input(s): TSH, T4TOTAL, FREET4, T3FREE, THYROIDAB in the last 72 hours. Anemia Panel: No results for input(s): VITAMINB12, FOLATE, FERRITIN, TIBC, IRON, RETICCTPCT in the last 72 hours. Sepsis Labs: No results for input(s): PROCALCITON, LATICACIDVEN in the last 168 hours.  Recent Results (from the past 240 hour(s))  Culture, body fluid-bottle     Status: None   Collection Time: 11/30/18 10:56 AM  Result Value Ref Range Status   Specimen Description PLEURAL LEFT  Final   Special Requests NONE  Final   Culture   Final    NO GROWTH 5 DAYS Performed at [facility], [address] [place], [place] [address]    Report Status 12/05/2018 FINAL  Final  Gram stain     Status: None   Collection Time: 11/30/18 10:56 AM  Result Value Ref Range Status   Specimen Description PLEURAL LEFT  Final   Special Requests NONE  Final   Gram Stain   Final    RARE WBC PRESENT,BOTH PMN AND MONONUCLEAR NO ORGANISMS SEEN Performed at [facility], [place] [address], [place], [place] [address]    Report Status 12/01/2018 FINAL  Final         Radiology Studies: Dg Chest 1 View  Result Date: 12/06/2018 CLINICAL DATA:  Followup left thoracentesis EXAM: CHEST  1 VIEW COMPARISON:  12/05/2018 FINDINGS: Thoracentesis on the left by history. There continues to be extensive opacity within the left hemithorax. There is some aerated lung in the lower chest. The degree of aeration of the lower lung is probably improved. There is no evidence of pneumothorax. Right chest remains clear. IMPRESSION: Some improvement in aeration of the left lower lung following left thoracentesis. No pneumothorax. Electronically Signed   By: Nelson Chimes M.D.   On: 12/06/2018 10:56   Dg Chest Port 1 View  Result Date: 12/05/2018 CLINICAL DATA:  Pleural effusion. EXAM: PORTABLE CHEST 1 VIEW COMPARISON:  CT scan and radiograph of November 30, 2018. FINDINGS: Stable near complete opacification of left hemithorax is noted,  with stable aerated lung seen in the left lower lobe. This is most consistent with pneumonia or atelectasis with associated pleural effusion. Mild mediastinal shift from right to left is noted. Atherosclerosis of thoracic aorta is noted. No pneumothorax is noted. Bony thorax is unremarkable. IMPRESSION: Stable left lung findings as described above. Aortic Atherosclerosis (ICD10-I70.0). Electronically Signed   By: Marijo Conception M.D.   On: 12/05/2018 07:41   Ir Thoracentesis Asp Pleural Space W/img Guide  Result Date: 12/06/2018 INDICATION: History of lung cancer. Recurrent left-sided pleural effusion. Request therapeutic thoracentesis. EXAM: ULTRASOUND GUIDED LEFT THORACENTESIS MEDICATIONS: None. COMPLICATIONS: None immediate. PROCEDURE: An ultrasound guided thoracentesis was thoroughly discussed with the patient and questions answered. The benefits, risks, alternatives and complications were also discussed. The patient understands and wishes to proceed with the procedure. Written consent was obtained. Ultrasound of the left chest demonstrates that the effusion has become severely loculated in the interval since previous procedure. Ultrasound was performed to localize and mark an adequate pocket of fluid in the left chest. The area was then prepped and draped in the normal sterile fashion. 1% Lidocaine was used for local anesthesia. Under ultrasound guidance a 6 Fr Safe-T-Centesis catheter was introduced. Thoracentesis was performed. The catheter was removed and a dressing applied. FINDINGS: A total of approximately 800 mL of clear, dark yellow fluid was removed. IMPRESSION: Successful ultrasound guided left thoracentesis yielding 800 mL of pleural fluid. Left pleural effusion has become markedly loculated in the interval. Read by: Ascencion Dike PA-C Electronically Signed   By: Markus Daft M.D.   On: 12/06/2018 10:40        Scheduled Meds:  sodium chloride   Intravenous Once   aspirin EC  81 mg Oral  Daily   docusate sodium  100 mg Oral BID   enoxaparin (LOVENOX) injection  40 mg Subcutaneous Q24H   ferrous sulfate  325 mg Oral TID WC   levothyroxine  112 mcg Oral QAC breakfast   lidocaine       metoprolol succinate  25 mg Oral Daily   nicotine  14 mg Transdermal Daily   pantoprazole  40 mg Oral QHS   umeclidinium-vilanterol  1 puff Inhalation Daily   Continuous Infusions:  ampicillin-sulbactam (UNASYN) IV 3 g (12/06/18 0723)     LOS: 5 days    Time spent: [place]    Nicolette Bang, MD [facility]  If 7PM-7AM, please contact night-coverage  12/06/2018, 1:51 PM

## 2018-12-06 NOTE — Progress Notes (Signed)
Physical Therapy Treatment Patient Details Name: Brad [name] MRN: [id number] DOB: June 23, 1956 Today's Date: 12/06/2018    History of Present Illness Pt is a 63 y.o. male admitted 11/29/18 with generalized weakness; likely multifactorial due to sedentary, overdiuresis last admission and recurrent pleural effusion. S/p L-side thoracentesis 4/25. Of note, recent admission from jail 4/15-4/22 with likely CHF exacerbation; pt was reportedly d/c home on house arrest but unable to move. 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    PT Comments    Patient progressing with therapy, ambulating this visit 100' with supervision. Intermittent use of IV pole, unsteady gait without. Updated recs for HHPT with use of RW .   Follow Up Recommendations  Home health PT     Equipment Recommendations  Rolling walker with 5" wheels    Recommendations for Other Services       Precautions / Restrictions Precautions Precautions: Fall    Mobility  Bed Mobility Overal bed mobility: (pt sitting EOB on OT arrival)                Transfers Overall transfer level: Needs assistance   Transfers: Sit to/from Stand Sit to Stand: Min guard         General transfer comment: Close guarding for safety due to refusal of RW use and poor awareness of IV pole.  Ambulation/Gait Ambulation/Gait assistance: Min assist Gait Distance (Feet): 100 Feet Assistive device: IV Pole;None Gait Pattern/deviations: Step-through pattern;Decreased stride length;Trunk flexed;Staggering left;Staggering right Gait velocity: Decreased   General Gait Details: min A for steadying and for IV pole management which he seems completely oblivious about, would benefit from RW but refuse use, reaching out for sink and furniture in room to steady   Stairs             Wheelchair Mobility    Modified Rankin (Stroke Patients Only)       Balance Overall balance  assessment: Needs assistance Sitting-balance support: No upper extremity supported;Feet supported Sitting balance-Leahy Scale: Good       Standing balance-Leahy Scale: Fair Standing balance comment: Able to static stand without UE support minor LoB but able to steady himself                            Cognition Arousal/Alertness: Awake/alert Behavior During Therapy: Flat affect;Impulsive Overall Cognitive Status: No family/caregiver present to determine baseline cognitive functioning                                 General Comments: decreased safety awareness and very impulsive with movement       Exercises      General Comments        Pertinent Vitals/Pain Faces Pain Scale: Hurts little more Pain Location: generalized with movement Pain Descriptors / Indicators: Grimacing;Moaning    Home Living                      Prior Function            PT Goals (current goals can now be found in the care plan section) Acute Rehab PT Goals Patient Stated Goal: Pt unsure about SNF stating, "Let's see how I feel in a few days" PT Goal Formulation: With patient Time For Goal Achievement: 12/14/18 Potential to Achieve Goals: Good Progress towards PT goals:  Progressing toward goals    Frequency    Min 3X/week      PT Plan Discharge plan needs to be updated    Co-evaluation              AM-PAC PT "6 Clicks" Mobility   Outcome Measure  Help needed turning from your back to your side while in a flat bed without using bedrails?: None Help needed moving from lying on your back to sitting on the side of a flat bed without using bedrails?: None Help needed moving to and from a bed to a chair (including a wheelchair)?: A Little Help needed standing up from a chair using your arms (e.g., wheelchair or bedside chair)?: A Little Help needed to walk in hospital room?: A Little Help needed climbing 3-5 steps with a railing? : A Little 6  Click Score: 20    End of Session Equipment Utilized During Treatment: Gait belt Activity Tolerance: Patient limited by fatigue Patient left: in chair;with call bell/phone within reach;with chair alarm set Nurse Communication: Mobility status PT Visit Diagnosis: Other abnormalities of gait and mobility (R26.89);Muscle weakness (generalized) (M62.81)     Time: 1640-1700 PT Time Calculation (min) (ACUTE ONLY): 20 min  Charges:  $Gait Training: 8-22 mins                     Reinaldo Berber, PT, DPT Acute Rehabilitation Services Pager: [phone] Office: [place] 12/06/2018, 4:35 PM

## 2018-12-06 NOTE — Progress Notes (Signed)
CRITICAL VALUE ALERT  Critical Value:  Hgb 6.9  Date & Time Notied:  12/06/2018 0405  Provider Notified: [place]  Orders Received/Actions taken:

## 2018-12-06 NOTE — Progress Notes (Signed)
Pharmacy Antibiotic Note  Brad [name] is a 63 y.o. male admitted on 11/29/2018 with Lung abscess s/p thoracentesis .  Pharmacy has been consulted for Unasyn dosing.  ID: Lung abscess s/p thoracentesis (exudative, suspect malignant). Chronic Hep C. Awaiting pleural fluid cultures. Widely metastatic lung cancer. -wbc wnl, afebrile, Scr 1.35  Unasyn 4/25 >   4/25 pleural fluid cx > negative 4/25: Pleural fluid gram stain: negative  Plan: Unasyn 3g IV q 6 hrs. Dose ok for now. Per MD note, planned 1 more day of Unasyn  Height: 5\' 11"  (180.3 cm) Weight: 199 lb 6.4 oz (90.4 kg) IBW/kg (Calculated) : 75.3  Temp (24hrs), Avg:98.2 F (36.8 C), Min:97.8 F (36.6 C), Max:98.7 F (37.1 C)  Recent Labs  Lab 12/01/18 0306 12/02/18 0420 12/03/18 0337 12/04/18 0227 12/05/18 0339  WBC 6.7 9.5 8.2 6.1 7.0  CREATININE 1.17 1.39* 1.56* 1.30* 1.35*    Estimated Creatinine Clearance: 66.1 mL/min (A) (by C-G formula based on SCr of 1.35 mg/dL (H)).    Allergies  Allergen Reactions  . Doxycycline Hives and Itching  . Lipitor [Atorvastatin] Other (See Comments)    Myalgia   . Oxycodone Itching    Marguerite Olea, [facility] Clinical Pharmacist Phone [phone]  12/06/2018 9:57 AM   12/06/2018 9:56 AM

## 2018-12-06 NOTE — Consult Note (Signed)
   [facility] CM Inpatient Consult   12/06/2018  Brad [name] July 24, 1956 [id number]  Referral received from inpatient [facility] RNCM on 12/04/2018 and spoke with her regarding issues and barriers to care and to check eligiblity of [facility] services.being recommended for skilled nursing facility but refusing.   Patient screened for high risk score [26%] for unplanned readmissions and hospitalizations less than 7 days.  Patient has a distant history with [facility] with a [facility] Care Manager Coordinator.   Patient's progress notes for hospitalization is copied from the MD brief narrative note as follows:  63 y.o.malewith medical history significant ofCVA; stage 3 SCC of the LUL s/phemoradiation and incomplete chemotherapy; chronic combined CHF (EF 45-50% with grade 2 DD in 2/17); hypothyoiridsm; HTN; HLD; DM; chronic hep C; and COPD presenting with weakness. Since hospital discharge, he was placed on his front porch and he wasn't able to move. He was unable to get up and go to the bathroom. His legs are just too weak/ Denies SOB. No cough. Regarding his cancer, was last seen by oncology in February 2019 then lost to follow-up. He was admitted from 4/15-22 from jail. His diagnosis of widely metastatic left lung cancer is noted from CT scan 11/30/2018. Patient has been consulted with Palliative Care and for Care Connections with [facility].  Primary Care Provider is [name], MD.  This provider office is listed to provide the transition of care follow up.  Call attempt to patient's room to follow up for needs for transition from the hospital but currently no answer.  Will follow up with inpatient TOC RNCM, as needed.  Will continue to follow for progress and needs.   Please place a [facility] Care Management consult or for questions contact:   Natividad Brood, RN BSN [facility] Liaison  [phone] business mobile phone Toll free office  [phone]  Fax number: [phone] [name]  [email or web address]

## 2018-12-06 NOTE — Procedures (Signed)
PROCEDURE SUMMARY:  [place] left chest finds effusion has become severely loculated in the interval since prior thora.  Successful US guided left thoracentesis. Yielded 800 mL of dark yellow fluid. Pt tolerated procedure well. No immediate complications.  Specimen was not sent for labs. CXR ordered.  EBL < 5 mL  Ascencion Dike PA-C 12/06/2018 10:35 AM

## 2018-12-07 LAB — TYPE AND SCREEN
ABO/RH(D): B POS
Antibody Screen: NEGATIVE
Unit division: 0
Unit division: 0

## 2018-12-07 LAB — GLUCOSE, CAPILLARY
Glucose-Capillary: 98 mg/dL (ref 70–99)
Glucose-Capillary: 129 mg/dL — ABNORMAL HIGH (ref 70–99)
Glucose-Capillary: 126 mg/dL — ABNORMAL HIGH (ref 70–99)
Glucose-Capillary: 158 mg/dL — ABNORMAL HIGH (ref 70–99)

## 2018-12-07 LAB — BPAM RBC
Blood Product Expiration Date: 202005022359
Blood Product Expiration Date: 202005112359
ISSUE DATE / TIME: 202004290946
ISSUE DATE / TIME: 202005011118
Unit Type and Rh: 7300
Unit Type and Rh: 9500

## 2018-12-07 LAB — HEMOGLOBIN AND HEMATOCRIT, BLOOD
HCT: 24.6 % — ABNORMAL LOW (ref 39.0–52.0)
Hemoglobin: 7.6 g/dL — ABNORMAL LOW (ref 13.0–17.0)

## 2018-12-07 MED ORDER — AMLODIPINE BESYLATE 5 MG PO TABS
5.0000 mg | ORAL_TABLET | Freq: Every day | ORAL | Status: DC
  Administered 2018-12-07 – 2018-12-09 (×3): 5 mg via ORAL
  Filled 2018-12-07 (×3): qty 1

## 2018-12-07 NOTE — Progress Notes (Signed)
PROGRESS NOTE    Brad [name]  QQP:[id number] DOB: 1955/09/30 DOA: 11/29/2018 PCP: Biagio Borg, MD   Brief Narrative:  63 y.o.malewith medical history significant ofCVA; stage 3 SCC of the LUL s/phemoradiation and incomplete chemotherapy; chronic combined CHF (EF 45-50% with grade 2 DD in 2/17); hypothyoiridsm; HTN; HLD; DM; chronic hep C; and COPD presenting with weakness. Since hospital discharge, he was placed on his front porch and he wasn't able to move. He was unable to get up and go to the bathroom. His legs are just too weak/ Denies SOB. No cough. Regarding his cancer, was last seen by oncology in February 2019 then lost to follow-up. He was admitted from 4/15-22 from jail (incarcerated since 7/12/45YKD uncertain reason; prior conviction on 12/02/12 for taking indecent liberties with a child) with pleural effusion, negative for malignancy, thought to be due to CHF exacerbation. He was diuresed 18L during the hospitalization. He has h/o GI bleeding and anemia of chronic disease and his Hgb was as low as 6.6, requiring transfusion of 1 unit PRBC.  ED Course:Recently discharged to home from jail. Chronically elevated troponin, stable. Not clearly in CHF. No hypoxia. Had thoracentesis during prior hospitalization, now opacified. Too weak to go home, tachypnea with reaccummulation of fluid after last hospitalization   Assessment & Plan:   Principal Problem:   Weakness Active Problems:   Diabetes ([place])   Hypothyroidism   Tobacco abuse   Essential hypertension   HLD (hyperlipidemia)   Pleural effusion   Stage III squamous cell carcinoma of left lung (HCC)   AKI (acute kidney injury) (HCC)   Chronic combined systolic and diastolic CHF (congestive heart failure) ([place])   Widely metastatic left lung cancer,as previously noted,CT scan 4/25 shows cancer worsening,"Large centrally necrotic mass in the left upper lobe, likely toreflect treated neoplasm. Given  the appearance of this lesion on today's examination, this is favored to reflect a large pulmonary abscess at this time. This is associated with a moderate loculated left pleural effusion, largest component of which is subpulmonic in position and also with new 2.8 x 1.8 cm RLL pulm nodule, multiple right adrenal nodules and masses, and new lytic lesions in the left side of the manubrium and the right S1 vertebra".Patient was lost to follow-up was on concurrent chemoradiation in 2019 February. Oncology has been consultedand patient has agreed for follow-up as an outpatient in 1 to 2 weeks. Oncology is helping assist with transportation for that follow-up.Palliativecare met with the patient and he indicated he would not want to do chemotherapy also so it is unclear if a follow-up appoint will have much significance in terms of his outcome.  Large loculated left pleural effusionstatus post thoracentesis 1.9 L fluid removal, suspect malignant effusion. Gram stain culture no growth, fluid appears exudative.Cytology negative for malignant cells. He isafebrile , blood cultures no growth so far. On unasyn for now-see above.Seen by CT surgery advised intermittent thoracentesis, Pleurx catheter for comfort if recurrent, cxr still with opacification, repeated thoracentesis May 1 with 800 cc of fluid.  Discussed plan of care with patient he agreed to meet with palliative today to discuss options with hospice, THIS REMAINS PENDING. Pt had again declined SNF and because of his legal record [place] agency options havent been identified.   Generalized Weakness: Multifactorial in the setting of metastatic cancer, anemia. PT OT evaluationappreciated, patient appears to be resistant to skilled nursing facilityalthough would benefit, there is difficulty getting home health agencies because of his sex offender status,  discussed with treatment team who are evaluating additional options-still somewhat  limited  Chronic combined systolic and diastolic CHF: TTE lvef 82-50%, severe concentric LVH, severe LAE moderate RA and concern for infiltrative process on recent echo. compensated. Patient overdiuresied on recent admission.cont Holding diuretics for now  COPD,stable continue bronchodilators.  Acute anemia of chronic disease/malignancywith hemoglobin6.9g. Continue iron supplementation, transfused for hemoglobin less than 7.With 1 unit ordered for today  Prediabetes:Hha1c 6.2 stable. Controlled. Monitor CBG  Hypothyroidismon levothyroxinenormal dysregulation  Tobacco abuse: Cessation advised  Essential hypertension: Stable on metoprolol  AKI: creat 1.35., recheck labs in the morning  History of CVA/PVD/Carotid endarterectomy, on aspirin  History of duodenal ulcer- cont ppi  History of chronic hep C.No acute intervention currently monitor LFTs as needed  Recently discharged from jail. Has a monitoring device on 1 of his legs  Noncompliance/poor follow-up:Patient has agreed to follow-up with oncology although with his lack of interest in doing chemotherapy this will be of questionable benefit  DVT prophylaxis: Lovenox SQ  Code Status: DNR    Code Status Orders  (From admission, onward)         Start     Ordered   12/03/18 1231  Do not attempt resuscitation (DNR)  Continuous    Question Answer Comment  In the event of cardiac or respiratory ARREST Do not call a code blue   In the event of cardiac or respiratory ARREST Do not perform Intubation, CPR, defibrillation or ACLS   In the event of cardiac or respiratory ARREST Use medication by any route, position, wound care, and other measures to relive pain and suffering. May use oxygen, suction and manual treatment of airway obstruction as needed for comfort.      12/03/18 1231        Code Status History    Date Active Date Inactive Code Status Order ID Comments User Context   11/30/2018 0818  12/03/2018 1231 Full Code [id number]  Karmen Bongo, MD ED   11/20/2018 2224 11/27/2018 1517 Full Code [id number]  Lenore Cordia, MD ED   12/08/2015 1011 12/09/2015 1546 Full Code [id number]  Rondel Jumbo, PA-C ED   09/27/2015 1824 09/29/2015 2201 Full Code [id number]  Charlynne Cousins, MD Inpatient   11/13/2014 1245 11/14/2014 1138 Full Code [id number]  Serafina Mitchell, MD Inpatient   09/23/2014 1223 09/25/2014 1557 Full Code [id number]  Kelvin Cellar, MD ED   02/28/2014 1725 03/01/2014 1908 Full Code [id number]  Modena Jansky, MD Inpatient     Family Communication: None per pt Disposition Plan:   Pt will remain in the hospital an additional day for monitoring of hgb with recent transfusion 5/1.  Has declined SNF and CM contineus to try and locate [facility] agency willing to work with pt given his legal record for sexual offenses Consults called: None Admission status: Inpatient   Consultants:   ir, palliative, oncology  Procedures:  Dg Chest 1 View  Result Date: 12/06/2018 CLINICAL DATA:  Followup left thoracentesis EXAM: CHEST  1 VIEW COMPARISON:  12/05/2018 FINDINGS: Thoracentesis on the left by history. There continues to be extensive opacity within the left hemithorax. There is some aerated lung in the lower chest. The degree of aeration of the lower lung is probably improved. There is no evidence of pneumothorax. Right chest remains clear. IMPRESSION: Some improvement in aeration of the left lower lung following left thoracentesis. No pneumothorax. Electronically Signed   By: Nelson Chimes M.D.   On: 12/06/2018 10:56   Dg  Chest 1 View  Result Date: 11/30/2018 CLINICAL DATA:  Status post left thoracentesis. EXAM: CHEST  1 VIEW COMPARISON:  November 30, 2018 FINDINGS: A significant left-sided pleural effusion remains. No pneumothorax after thoracentesis. The right lung is clear. IMPRESSION: Significant effusion and underlying opacity remains on the left. However, no pneumothorax is seen after  left-sided thoracentesis. Electronically Signed   By: Dorise Bullion III M.D   On: 11/30/2018 11:28   Dg Chest 1 View  Result Date: 11/21/2018 CLINICAL DATA:  Follow-up thoracentesis EXAM: CHEST  1 VIEW COMPARISON:  11/20/2018 FINDINGS: Right chest remains clear. Opacification of the left lung appears progressive. The amount of pleural density appears similar. No evidence of pneumothorax. IMPRESSION: Similar appearance of pleural density on the left. Worsened opacity of the left lung. Electronically Signed   By: Nelson Chimes M.D.   On: 11/21/2018 12:31   Dg Chest 2 View  Result Date: 11/30/2018 CLINICAL DATA:  Shortness of breath EXAM: CHEST - 2 VIEW COMPARISON:  11/21/2018 FINDINGS: Continued near complete opacification of the left hemithorax with left pleural density and airspace disease. Right lung clear. There is cardiomegaly. No acute bony abnormality. IMPRESSION: Continued near complete opacification of the left hemithorax with diffuse left lung airspace disease and left pleural density, presumably effusion. Electronically Signed   By: Rolm Baptise M.D.   On: 11/30/2018 02:27   Dg Chest 2 View  Result Date: 11/20/2018 CLINICAL DATA:  Chest pain EXAM: CHEST - 2 VIEW COMPARISON:  06/26/2017 FINDINGS: Large left pleural effusion with basilar and apical components. Approximately 50% volume loss within the left lung. Left chest surgical clips. Right lung is clear. IMPRESSION: Large left pleural effusion with basilar and apical components with moderate left lung volume loss. Electronically Signed   By: Ulyses Jarred M.D.   On: 11/20/2018 17:51   Ct Chest W Contrast  Result Date: 11/30/2018 CLINICAL DATA:  63 year old male with history of non-small cell lung cancer. Staging examination. EXAM: CT CHEST, ABDOMEN, AND PELVIS WITH CONTRAST TECHNIQUE: Multidetector CT imaging of the chest, abdomen and pelvis was performed following the standard protocol during bolus administration of intravenous contrast.  CONTRAST:  32m OMNIPAQUE IOHEXOL 300 MG/ML  SOLN COMPARISON:  PET-CT 04/25/2017. FINDINGS: CT CHEST FINDINGS Cardiovascular: Heart size is mildly enlarged. There is no significant pericardial fluid, thickening or pericardial calcification. There is aortic atherosclerosis, as well as atherosclerosis of the great vessels of the mediastinum and the coronary arteries, including calcified atherosclerotic plaque in the left main, left anterior descending, left circumflex and right coronary arteries. Mediastinum/Nodes: No pathologically enlarged mediastinal or hilar lymph nodes. Esophagus is unremarkable in appearance. No axillary lymphadenopathy. Lungs/Pleura: Previously noted left upper lobe nodule is no longer clearly identifiable. Several fiducial markers are noted in the left upper lobe. On today's examination there is a new 7.8 x 9.5 x 8.3 cm mass-like area (axial image 21 of series 4 and coronal image 105 of series 6) which is centrally hypovascular with multiple internal locules of gas, favored to represent a large centrally necrotic mass, likely with superinfection (i.e., a large pulmonary abscess) with some adjacent postobstructive changes in the remaining portions of the left upper lobe. This is associated with outward bowing of the left major fissure. Some consolidative changes in volume loss are noted in the anterior and lateral aspect of the left lower lobe. Moderate left pleural effusion predominantly loculated in a subpulmonic position. New right lower lobe pulmonary nodule (axial image 131 of series 5) measuring 2.8 x 1.8  cm, concerning for metastatic lesion. No right pleural effusion. Musculoskeletal: Lytic lesion in the left side of the manubrium measuring 2.5 x 1.7 cm (axial image 13 of series 4). CT ABDOMEN PELVIS FINDINGS Hepatobiliary: No suspicious cystic or solid hepatic lesions. No intra or extrahepatic biliary ductal dilatation. Gallbladder is unremarkable in appearance. Pancreas: No pancreatic  mass. No pancreatic ductal dilatation. No pancreatic or peripancreatic fluid or inflammatory changes. Spleen: Unremarkable. Adrenals/Urinary Tract: Multiple new right adrenal nodules and masses, compatible with metastatic disease, largest of which measures 2.8 x 3.8 cm (axial image 71 of series 4). Left adrenal gland is normal in appearance. Left kidney is normal in appearance. Multiple low-attenuation lesions in the right kidney, compatible with simple cysts measuring up to 4.2 cm in the interpolar region. In the lower pole of the right kidney there is also an exophytic intermediate attenuation lesion which is incompletely characterized, but similar to prior PET-CT 04/25/2017, favored to represent a mildly proteinaceous/hemorrhagic cyst. No hydroureteronephrosis. Urinary bladder is normal in appearance. Stomach/Bowel: Normal appearance of the stomach. No pathologic dilatation of small bowel or colon. Numerous colonic diverticulae are noted, particularly in the proximal sigmoid colon, without surrounding inflammatory changes to suggest an acute diverticulitis at this time. Normal appendix. Vascular/Lymphatic: Aortic atherosclerosis, without evidence of aneurysm or dissection in the abdominal or pelvic vasculature. No lymphadenopathy noted in the abdomen or pelvis. Reproductive: Prostate gland and seminal vesicles are unremarkable in appearance. Other: No significant volume of ascites.  No pneumoperitoneum. Musculoskeletal: Large lytic lesion in the right side of the S1 vertebra measuring 3.2 x 4.2 cm (axial image 97 of series 4). IMPRESSION: 1. Large centrally necrotic mass in the left upper lobe, likely to reflect treated neoplasm. Given the appearance of this lesion on today's examination, this is favored to reflect a large pulmonary abscess at this time. This is associated with a moderate loculated left pleural effusion, largest component of which is subpulmonic in position. 2. New 2.8 x 1.8 cm right lower lobe  pulmonary nodule, multiple right adrenal nodules and masses, and new lytic lesions in the left side of the manubrium and the right S1 vertebra, compatible with widespread metastatic disease. 3. Colonic diverticulosis without evidence of acute diverticulitis at this time. 4. Aortic atherosclerosis, in addition to left main and 3 vessel coronary artery disease. Please note that although the presence of coronary artery calcium documents the presence of coronary artery disease, the severity of this disease and any potential stenosis cannot be assessed on this non-gated CT examination. Assessment for potential risk factor modification, dietary therapy or pharmacologic therapy may be warranted, if clinically indicated. 5. Additional incidental findings, as above. Electronically Signed   By: Vinnie Langton M.D.   On: 11/30/2018 12:02   Ct Abdomen Pelvis W Contrast  Result Date: 11/30/2018 CLINICAL DATA:  63 year old male with history of non-small cell lung cancer. Staging examination. EXAM: CT CHEST, ABDOMEN, AND PELVIS WITH CONTRAST TECHNIQUE: Multidetector CT imaging of the chest, abdomen and pelvis was performed following the standard protocol during bolus administration of intravenous contrast. CONTRAST:  62m OMNIPAQUE IOHEXOL 300 MG/ML  SOLN COMPARISON:  PET-CT 04/25/2017. FINDINGS: CT CHEST FINDINGS Cardiovascular: Heart size is mildly enlarged. There is no significant pericardial fluid, thickening or pericardial calcification. There is aortic atherosclerosis, as well as atherosclerosis of the great vessels of the mediastinum and the coronary arteries, including calcified atherosclerotic plaque in the left main, left anterior descending, left circumflex and right coronary arteries. Mediastinum/Nodes: No pathologically enlarged mediastinal or hilar lymph  nodes. Esophagus is unremarkable in appearance. No axillary lymphadenopathy. Lungs/Pleura: Previously noted left upper lobe nodule is no longer clearly  identifiable. Several fiducial markers are noted in the left upper lobe. On today's examination there is a new 7.8 x 9.5 x 8.3 cm mass-like area (axial image 21 of series 4 and coronal image 105 of series 6) which is centrally hypovascular with multiple internal locules of gas, favored to represent a large centrally necrotic mass, likely with superinfection (i.e., a large pulmonary abscess) with some adjacent postobstructive changes in the remaining portions of the left upper lobe. This is associated with outward bowing of the left major fissure. Some consolidative changes in volume loss are noted in the anterior and lateral aspect of the left lower lobe. Moderate left pleural effusion predominantly loculated in a subpulmonic position. New right lower lobe pulmonary nodule (axial image 131 of series 5) measuring 2.8 x 1.8 cm, concerning for metastatic lesion. No right pleural effusion. Musculoskeletal: Lytic lesion in the left side of the manubrium measuring 2.5 x 1.7 cm (axial image 13 of series 4). CT ABDOMEN PELVIS FINDINGS Hepatobiliary: No suspicious cystic or solid hepatic lesions. No intra or extrahepatic biliary ductal dilatation. Gallbladder is unremarkable in appearance. Pancreas: No pancreatic mass. No pancreatic ductal dilatation. No pancreatic or peripancreatic fluid or inflammatory changes. Spleen: Unremarkable. Adrenals/Urinary Tract: Multiple new right adrenal nodules and masses, compatible with metastatic disease, largest of which measures 2.8 x 3.8 cm (axial image 71 of series 4). Left adrenal gland is normal in appearance. Left kidney is normal in appearance. Multiple low-attenuation lesions in the right kidney, compatible with simple cysts measuring up to 4.2 cm in the interpolar region. In the lower pole of the right kidney there is also an exophytic intermediate attenuation lesion which is incompletely characterized, but similar to prior PET-CT 04/25/2017, favored to represent a mildly  proteinaceous/hemorrhagic cyst. No hydroureteronephrosis. Urinary bladder is normal in appearance. Stomach/Bowel: Normal appearance of the stomach. No pathologic dilatation of small bowel or colon. Numerous colonic diverticulae are noted, particularly in the proximal sigmoid colon, without surrounding inflammatory changes to suggest an acute diverticulitis at this time. Normal appendix. Vascular/Lymphatic: Aortic atherosclerosis, without evidence of aneurysm or dissection in the abdominal or pelvic vasculature. No lymphadenopathy noted in the abdomen or pelvis. Reproductive: Prostate gland and seminal vesicles are unremarkable in appearance. Other: No significant volume of ascites.  No pneumoperitoneum. Musculoskeletal: Large lytic lesion in the right side of the S1 vertebra measuring 3.2 x 4.2 cm (axial image 97 of series 4). IMPRESSION: 1. Large centrally necrotic mass in the left upper lobe, likely to reflect treated neoplasm. Given the appearance of this lesion on today's examination, this is favored to reflect a large pulmonary abscess at this time. This is associated with a moderate loculated left pleural effusion, largest component of which is subpulmonic in position. 2. New 2.8 x 1.8 cm right lower lobe pulmonary nodule, multiple right adrenal nodules and masses, and new lytic lesions in the left side of the manubrium and the right S1 vertebra, compatible with widespread metastatic disease. 3. Colonic diverticulosis without evidence of acute diverticulitis at this time. 4. Aortic atherosclerosis, in addition to left main and 3 vessel coronary artery disease. Please note that although the presence of coronary artery calcium documents the presence of coronary artery disease, the severity of this disease and any potential stenosis cannot be assessed on this non-gated CT examination. Assessment for potential risk factor modification, dietary therapy or pharmacologic therapy may be warranted,  if clinically  indicated. 5. Additional incidental findings, as above. Electronically Signed   By: Vinnie Langton M.D.   On: 11/30/2018 12:02   Dg Chest Port 1 View  Result Date: 12/05/2018 CLINICAL DATA:  Pleural effusion. EXAM: PORTABLE CHEST 1 VIEW COMPARISON:  CT scan and radiograph of November 30, 2018. FINDINGS: Stable near complete opacification of left hemithorax is noted, with stable aerated lung seen in the left lower lobe. This is most consistent with pneumonia or atelectasis with associated pleural effusion. Mild mediastinal shift from right to left is noted. Atherosclerosis of thoracic aorta is noted. No pneumothorax is noted. Bony thorax is unremarkable. IMPRESSION: Stable left lung findings as described above. Aortic Atherosclerosis (ICD10-I70.0). Electronically Signed   By: Marijo Conception M.D.   On: 12/05/2018 07:41   Nm Scan Tumor Localize With Spect  Result Date: 11/25/2018 CLINICAL DATA:  HEART FAILURE. CONCERN FOR CARDIAC AMYLOIDOSIS. EXAM: NUCLEAR MEDICINE TUMOR LOCALIZATION. PYP CARDIAC AMYLOIDOSIS SCAN WITH SPECT TECHNIQUE: Following intravenous administration of radiopharmaceutical, anterior planar images of the chest were obtained. Regions of interest were placed on the heart and contralateral chest wall for quantitative assessment. Additional SPECT imaging of the chest was obtained. RADIOPHARMACEUTICALS:  21.7 mCi TECHNETIUM 99 PYROPHOSPHATE FINDINGS: Planar Visual assessment: Anterior planar imaging demonstrates radiotracer uptake within the heart less than than uptake within the adjacent ribs (Grade 1). Quantitative assessment : Quantitative assessment of the cardiac uptake compared to the contralateral chest wall is equal to 1.0 (H/CL = 1.0). SPECT assessment: SPECT imaging of the chest demonstrates minimal radiotracer accumulation within the LEFT ventricle. IMPRESSION: Visual and quantitative assessment (grade 1, H/CLL equal 1.0) are NOT suggestive of transthyretin amyloidosis. Electronically  Signed   By: Suzy Bouchard M.D.   On: 11/25/2018 14:33   Ir Thoracentesis Asp Pleural Space W/img Guide  Result Date: 12/06/2018 INDICATION: History of lung cancer. Recurrent left-sided pleural effusion. Request therapeutic thoracentesis. EXAM: ULTRASOUND GUIDED LEFT THORACENTESIS MEDICATIONS: None. COMPLICATIONS: None immediate. PROCEDURE: An ultrasound guided thoracentesis was thoroughly discussed with the patient and questions answered. The benefits, risks, alternatives and complications were also discussed. The patient understands and wishes to proceed with the procedure. Written consent was obtained. Ultrasound of the left chest demonstrates that the effusion has become severely loculated in the interval since previous procedure. Ultrasound was performed to localize and mark an adequate pocket of fluid in the left chest. The area was then prepped and draped in the normal sterile fashion. 1% Lidocaine was used for local anesthesia. Under ultrasound guidance a 6 Fr Safe-T-Centesis catheter was introduced. Thoracentesis was performed. The catheter was removed and a dressing applied. FINDINGS: A total of approximately 800 mL of clear, dark yellow fluid was removed. IMPRESSION: Successful ultrasound guided left thoracentesis yielding 800 mL of pleural fluid. Left pleural effusion has become markedly loculated in the interval. Read by: Ascencion Dike PA-C Electronically Signed   By: Markus Daft M.D.   On: 12/06/2018 10:40   Ir Thoracentesis Asp Pleural Space W/img Guide  Result Date: 11/21/2018 INDICATION: Patient with history of lung cancer, now with pleural effusion. Request is made for diagnostic and therapeutic left thoracentesis. EXAM: ULTRASOUND GUIDED DIAGNOSTIC AND THERAPEUTIC LEFT THORACENTESIS MEDICATIONS: 10 mL 1% lidocaine COMPLICATIONS: None immediate. PROCEDURE: An ultrasound guided thoracentesis was thoroughly discussed with the patient and questions answered. The benefits, risks, alternatives  and complications were also discussed. The patient understands and wishes to proceed with the procedure. Written consent was obtained. Ultrasound was performed to localize and mark an  adequate pocket of fluid in the left chest. The area was then prepped and draped in the normal sterile fashion. 1% Lidocaine was used for local anesthesia. Under ultrasound guidance a 6 Fr Safe-T-Centesis catheter was introduced. Thoracentesis was performed. The catheter was removed and a dressing applied. FINDINGS: A total of approximately 1.3 liters of yellow fluid was removed. Samples were sent to the laboratory as requested by the clinical team. IMPRESSION: Successful ultrasound guided diagnostic and therapeutic left thoracentesis yielding 1.3 liters of pleural fluid. Read by: Brynda [place] PA-C Electronically Signed   By: Markus Daft M.D.   On: 11/21/2018 12:50   US Thoracentesis Asp Pleural Space W/img Guide  Result Date: 11/30/2018 INDICATION: Patient with history of stage 3 SCC of the left upper lobe s/p radiation and incomplete chemotherapy, COPD, CHF who presented to [facility] ED this morning with complaints of weakness and dyspnea. Noted to have recurrent left pleural effusion - last thoracentesis 11/21/18 yielding 1.3 L pleural fluid. Request has been made to IR for diagnostic and therapeutic thoracentesis. EXAM: ULTRASOUND GUIDED LEFT THORACENTESIS MEDICATIONS: 8 mL 1% lidocaine. COMPLICATIONS: None immediate. PROCEDURE: An ultrasound guided thoracentesis was thoroughly discussed with the patient and questions answered. The benefits, risks, alternatives and complications were also discussed. The patient understands and wishes to proceed with the procedure. Written consent was obtained. Ultrasound was performed to localize and mark an adequate pocket of fluid in the left chest. The area was then prepped and draped in the normal sterile fashion. 1% Lidocaine was used for local anesthesia. Under ultrasound guidance a 6 Fr  Safe-T-Centesis catheter was introduced. Thoracentesis was performed. The catheter was removed and a dressing applied. FINDINGS: A total of approximately 1.9 L of golden yellow fluid was removed. Procedure was aborted due to patient complaints of chest pain - some residual fluid remains on post procedure ultrasound. samples were sent to the laboratory as requested by the clinical team. IMPRESSION: Successful ultrasound guided left thoracentesis yielding 1.9 L of pleural fluid. Read by Candiss Norse, PA-C Electronically Signed   By: Aletta Edouard M.D.   On: 11/30/2018 10:51     Antimicrobials:   Completed abx   Subjective: No acute events overnight, reports feels weak but improved from yesterday   Objective: Vitals:   12/07/18 0451 12/07/18 0453 12/07/18 0717 12/07/18 0853  BP: (!) 176/67   (!) 182/69  Pulse: 62   62  Resp:      Temp: 98.5 F (36.9 C)     TempSrc: Oral     SpO2: 93%  93%   Weight:  90.7 kg    Height:        Intake/Output Summary (Last 24 hours) at 12/07/2018 1042 Last data filed at 12/07/2018 0655 Gross per 24 hour  Intake 1900 ml  Output 1560 ml  Net 340 ml   Filed Weights   12/05/18 0500 12/06/18 0700 12/07/18 0453  Weight: 90.4 kg 90.4 kg 90.7 kg    Examination:  General exam: Appears calm and comfortable  Respiratory system: rale, rhonchi left side, no acc muscle use. Cardiovascular system: S1 & S2 heard, RRR. No JVD, murmurs, rubs, gallops or clicks. No pedal edema. Gastrointestinal system: Abdomen is nondistended, soft and nontender. No organomegaly or masses felt. Normal bowel sounds heard. Central nervous system: Alert and oriented. No focal neurological deficits. Extremities: Symmetric 5 x 5 power. Skin: No rashes, lesions or ulcers Psychiatry: Judgement and insight appear normal. Mood & affect appropriate.     Data Reviewed: I  have personally reviewed following labs and imaging studies  CBC: Recent Labs  Lab 12/01/18 0306  12/02/18 0420 12/03/18 0337 12/04/18 0227 12/05/18 0339 12/06/18 0314 12/06/18 1839 12/07/18 0220  WBC 6.7 9.5 8.2 6.1 7.0  --   --   --   NEUTROABS  --   --   --   --  5.6  --   --   --   HGB 7.0* 7.3* 7.1* 6.1* 7.1* 6.9* 7.6* 7.6*  HCT 23.9* 24.2* 23.4* 20.6* 23.2* 22.5* 24.3* 24.6*  MCV 78.6* 77.6* 78.3* 77.7* 79.5*  --   --   --   PLT 281 296 295 262 284  --   --   --    Basic Metabolic Panel: Recent Labs  Lab 12/01/18 0306 12/02/18 0420 12/03/18 0337 12/04/18 0227 12/05/18 0339  NA 138 135 135 136 136  K 3.8 3.8 4.0 4.2 4.1  CL 103 102 102 103 103  CO2 _0 GLUCOSE 104* 117* 171* 105* 100*  BUN 20 22 24* 25* 26*  CREATININE 1.17 1.39* 1.56* 1.30* 1.35*  CALCIUM 8.2* 8.1* 8.0* 8.0* 8.2*   GFR: Estimated Creatinine Clearance: 66.2 mL/min (A) (by C-G formula based on SCr of 1.35 mg/dL (H)). Liver Function Tests: Recent Labs  Lab 12/05/18 0339  AST 28  ALT 21  ALKPHOS 173*  BILITOT 0.6  PROT 6.0*  ALBUMIN 1.4*   No results for input(s): LIPASE, AMYLASE in the last 168 hours. No results for input(s): AMMONIA in the last 168 hours. Coagulation Profile: No results for input(s): INR, PROTIME in the last 168 hours. Cardiac Enzymes: No results for input(s): CKTOTAL, CKMB, CKMBINDEX, TROPONINI in the last 168 hours. BNP (last 3 results) No results for input(s): PROBNP in the last 8760 hours. HbA1C: No results for input(s): HGBA1C in the last 72 hours. CBG: Recent Labs  Lab 12/04/18 1702 12/06/18 0749 12/06/18 1154 12/06/18 2111 12/07/18 0740  GLUCAP 115* 103* 159* 193* 98   Lipid Profile: No results for input(s): CHOL, HDL, LDLCALC, TRIG, CHOLHDL, LDLDIRECT in the last 72 hours. Thyroid Function Tests: No results for input(s): TSH, T4TOTAL, FREET4, T3FREE, THYROIDAB in the last 72 hours. Anemia Panel: No results for input(s): VITAMINB12, FOLATE, FERRITIN, TIBC, IRON, RETICCTPCT in the last 72 hours. Sepsis Labs: No results for input(s):  PROCALCITON, LATICACIDVEN in the last 168 hours.  Recent Results (from the past 240 hour(s))  Culture, body fluid-bottle     Status: None   Collection Time: 11/30/18 10:56 AM  Result Value Ref Range Status   Specimen Description PLEURAL LEFT  Final   Special Requests NONE  Final   Culture   Final    NO GROWTH 5 DAYS Performed at [facility], [address], [place], [place] [address]    Report Status 12/05/2018 FINAL  Final  Gram stain     Status: None   Collection Time: 11/30/18 10:56 AM  Result Value Ref Range Status   Specimen Description PLEURAL LEFT  Final   Special Requests NONE  Final   Gram Stain   Final    RARE WBC PRESENT,BOTH PMN AND MONONUCLEAR NO ORGANISMS SEEN Performed at [facility], [place] [address], [place], [place] [address]    Report Status 12/01/2018 FINAL  Final         Radiology Studies: Dg Chest 1 View  Result Date: 12/06/2018 CLINICAL DATA:  Followup left thoracentesis EXAM: CHEST  1 VIEW COMPARISON:  12/05/2018  FINDINGS: Thoracentesis on the left by history. There continues to be extensive opacity within the left hemithorax. There is some aerated lung in the lower chest. The degree of aeration of the lower lung is probably improved. There is no evidence of pneumothorax. Right chest remains clear. IMPRESSION: Some improvement in aeration of the left lower lung following left thoracentesis. No pneumothorax. Electronically Signed   By: Nelson Chimes M.D.   On: 12/06/2018 10:56   Ir Thoracentesis Asp Pleural Space W/img Guide  Result Date: 12/06/2018 INDICATION: History of lung cancer. Recurrent left-sided pleural effusion. Request therapeutic thoracentesis. EXAM: ULTRASOUND GUIDED LEFT THORACENTESIS MEDICATIONS: None. COMPLICATIONS: None immediate. PROCEDURE: An ultrasound guided thoracentesis was thoroughly discussed with the patient and questions answered. The benefits, risks, alternatives and complications were also discussed. The patient  understands and wishes to proceed with the procedure. Written consent was obtained. Ultrasound of the left chest demonstrates that the effusion has become severely loculated in the interval since previous procedure. Ultrasound was performed to localize and mark an adequate pocket of fluid in the left chest. The area was then prepped and draped in the normal sterile fashion. 1% Lidocaine was used for local anesthesia. Under ultrasound guidance a 6 Fr Safe-T-Centesis catheter was introduced. Thoracentesis was performed. The catheter was removed and a dressing applied. FINDINGS: A total of approximately 800 mL of clear, dark yellow fluid was removed. IMPRESSION: Successful ultrasound guided left thoracentesis yielding 800 mL of pleural fluid. Left pleural effusion has become markedly loculated in the interval. Read by: Ascencion Dike PA-C Electronically Signed   By: Markus Daft M.D.   On: 12/06/2018 10:40        Scheduled Meds:  amLODipine  5 mg Oral Daily   aspirin EC  81 mg Oral Daily   docusate sodium  100 mg Oral BID   enoxaparin (LOVENOX) injection  40 mg Subcutaneous Q24H   ferrous sulfate  325 mg Oral TID WC   levothyroxine  112 mcg Oral QAC breakfast   metoprolol succinate  25 mg Oral Daily   nicotine  14 mg Transdermal Daily   pantoprazole  40 mg Oral QHS   umeclidinium-vilanterol  1 puff Inhalation Daily   Continuous Infusions:  ampicillin-sulbactam (UNASYN) IV 3 g (12/07/18 1023)     LOS: 6 days    Time spent: 35 min    Nicolette Bang, MD [facility]  If 7PM-7AM, please contact night-coverage  12/07/2018, 10:42 AM

## 2018-12-08 DIAGNOSIS — C3490 Malignant neoplasm of unspecified part of unspecified bronchus or lung: Secondary | ICD-10-CM

## 2018-12-08 LAB — HEMOGLOBIN AND HEMATOCRIT, BLOOD
HCT: 23.2 % — ABNORMAL LOW (ref 39.0–52.0)
Hemoglobin: 7.3 g/dL — ABNORMAL LOW (ref 13.0–17.0)

## 2018-12-08 LAB — GLUCOSE, CAPILLARY
Glucose-Capillary: 111 mg/dL — ABNORMAL HIGH (ref 70–99)
Glucose-Capillary: 204 mg/dL — ABNORMAL HIGH (ref 70–99)
Glucose-Capillary: 110 mg/dL — ABNORMAL HIGH (ref 70–99)

## 2018-12-08 MED ORDER — AMLODIPINE BESYLATE 5 MG PO TABS: 5.0000 mg | ORAL_TABLET | Freq: Every day | ORAL | 0 refills | Status: DC

## 2018-12-08 NOTE — Discharge Summary (Signed)
Physician Discharge Summary  Brad [name] EYC:[id number] DOB: [birth year] DOA: 11/29/2018  PCP: Biagio Borg, MD  Admit date: 11/29/2018 Discharge date: 12/08/2018  Admitted From: Inpatient Disposition: home  Recommendations for Outpatient Follow-up:  1. Follow up with PCP in 1-2 weeks 2. Please obtain BMP/CBC in one week 3. Please follow-up with hematology oncology as discussed per hemonc consultation  Home Health:Yes Equipment/Devices:none  Discharge Condition:Stable CODE STATUS:DNR Diet recommendation: Diabetic diet  Brief/Interim Summary: 64 y.o.malewith medical history significant ofCVA; stage 3 SCC of the LUL s/phemoradiation and incomplete chemotherapy; chronic combined CHF (EF 45-50% with grade 2 DD in 2/17); hypothyoiridsm; HTN; HLD; DM; chronic hep C; and COPD presenting with weakness. Since hospital discharge, he was placed on his front porch and he wasn't able to move. He was unable to get up and go to the bathroom. His legs are just too weak/ Denies SOB. No cough. Regarding his cancer, was last seen by oncology in February 2019 then lost to follow-up. He was admitted from 4/15-22 from jail (incarcerated since 1/49/70YOV uncertain reason; prior conviction on 12/02/12 for taking indecent liberties with a child) with pleural effusion, negative for malignancy, thought to be due to CHF exacerbation. He was diuresed 18L during the hospitalization. He has h/o GI bleeding and anemia of chronic disease and his Hgb was as low as 6.6, requiring transfusion of 1 unit PRBC.  ED Course:Recently discharged to home from jail. Chronically elevated troponin, stable. Not clearly in CHF. No hypoxia. Had thoracentesis during prior hospitalization, now opacified. Too weak to go home, tachypnea with reaccummulation of fluid after last hospitalization.  Hospital l course: Widely metastatic left lung cancer,as previously noted,CT scan 4/25 shows cancer worsening,"Large  centrally necrotic mass in the left upper lobe, likely toreflect treated neoplasm. Given the appearance of this lesion on today's examination, this is favored to reflect a large pulmonary abscess at this time. This is associated with a moderate loculated left pleural effusion, largest component of which is subpulmonic in position and also with new 2.8 x 1.8 cm RLL pulm nodule, multiple right adrenal nodules and masses, and new lytic lesions in the left side of the manubrium and the right S1 vertebra".Patient was lost to follow-up was on concurrent chemoradiation in 2019 February. Oncology has been consultedand patient has agreed for follow-up as an outpatient in 1 to 2 weeks. Oncology is helping assist with transportation for that follow-up.Palliativecare met with the patient and initially he indicated he would not want to do chemotherapy also so it was unclear if a follow-up appoint will have much significance in terms of his outcome.  To date patient now has agreed to follow-up with oncology for evaluation as an outpatient  Large loculated left pleural effusionpatient underwent thoracentesis x2 with 1.9 L removed and 800 subsequently 5 days later Gram stain showed no growth fluid appeared exudative cytology was negative for malignant cells.  She was seen by CT surgery who advised intermittent thoracentesis as needed.  Consider Pleurx catheter if this persists.  Patient wanted to wait till he saw oncology before pursuing any further procedures.  Patient is very ambivalent about his plan of care fluctuating between hospice care at least discussing with oncology any options.  .Generalized Weakness: Multifactorial in the setting of metastatic cancer, anemia. PT OT evaluationappreciated, patient declined skilled nursing facility but home health agency with PT has been arranged.  Chronic combined systolic and diastolic CHF: TTE lvef 78-58%, severe concentric LVH, severe LAE moderate RA and concern  for infiltrative  process on recent echo. compensated. Patient overdiuresied on recent admission. Patient will restart his home medications on discharge.  COPD,stable continue bronchodilators.  Acute anemia of chronic disease/malignancy: Patient transfused packed unit of blood cells x3.  Hemoglobin is remained stable this will be an ongoing problem given patient's widespread cancer with metastatic disease  Prediabetes:Hha1c 6.2 stable. Controlled. Continue diabetic diet.  Hypothyroidismon levothyroxine, no hormonal dysregulation noted patient will continue his home dose.  Tobacco abuse: Cessation advised, unfortunately patient does continue to smoke although at this point with stage IV cancer is advanced  Essential hypertension: Stable on metoprolol, patient will continue his home medications on discharge  AKI: This was consistent with a prerenal azotemia picture.  Patient was mildly hydrated, creatinine stabilized no further work-up necessary.  PCP to continue monitoring patient's renal function.   History of CVA/PVD/Carotid endarterectomy, on aspirin will continue on discharge  History of duodenal ulcer- cont ppi she will continue on discharge  History of chronic hep C.No acute intervention currently monitor LFTs as needed  Recently discharged from jail. Has a monitoring device on 1 of his legs  Noncompliance/poor follow-up:Patient has agreed to follow-up with oncology    Discharge Diagnoses:  Principal Problem:   Weakness Active Problems:   Diabetes ([place])   Hypothyroidism   Tobacco abuse   Essential hypertension   HLD (hyperlipidemia)   Pleural effusion   Stage III squamous cell carcinoma of left lung (HCC)   AKI (acute kidney injury) (HCC)   Chronic combined systolic and diastolic CHF (congestive heart failure) (HCC)   Lung cancer [facility])    Discharge Instructions  Discharge Instructions    Diet Carb Modified   Complete by:  As directed     Discharge instructions   Complete by:  As directed    Return to local ER for any acute change in medical condition to include but not limited to nausea vomiting fevers chills chest pain shortness of breath.  Patient also follow-up with hematology oncology as discussed by [facility] consultation.   Increase activity slowly   Complete by:  As directed      Allergies as of 12/08/2018      Reactions   Doxycycline Hives, Itching   Lipitor [atorvastatin] Other (See Comments)   Myalgia   Oxycodone Itching      Medication List    TAKE these medications   amLODipine 5 MG tablet Commonly known as:  NORVASC Take 1 tablet (5 mg total) by mouth daily for 30 days. Start taking on:  Dec 09, 2018   aspirin EC 81 MG tablet Take 1 tablet (81 mg total) by mouth daily.   Blood Pressure Monitor Devi Use to check Blood pressure daily   ferrous sulfate 325 (65 FE) MG tablet Take 1 tablet (325 mg total) by mouth 3 (three) times daily with meals.   furosemide 80 MG tablet Commonly known as:  LASIX Take 1 tablet (80 mg total) by mouth daily for 30 days.   glucose blood test strip Commonly known as:  Accu-Chek Aviva Plus Use to check blood sugars daily Dx E11.9   levothyroxine 112 MCG tablet Commonly known as:  SYNTHROID Take 1 tablet (112 mcg total) daily by mouth.   metoprolol succinate 25 MG 24 hr tablet Commonly known as:  TOPROL-XL Take 1 tablet (25 mg total) by mouth daily for 30 days.   pantoprazole 40 MG tablet Commonly known as:  PROTONIX Take 1 tablet (40 mg total) by mouth at bedtime for 30 days.  umeclidinium-vilanterol 62.5-25 MCG/INH Aepb Commonly known as:  Anoro Ellipta Inhale 1 puff into the lungs daily.            Durable Medical Equipment  (From admission, onward)         Start     Ordered   12/08/18 1131  For home use only DME Walker rolling  Once    Question:  Patient needs a walker to treat with the following condition  Answer:  Weakness   12/08/18 1131           Allergies  Allergen Reactions  . Doxycycline Hives and Itching  . Lipitor [Atorvastatin] Other (See Comments)    Myalgia   . Oxycodone Itching    Consultations:  IR, palliative, oncology   Procedures/Studies: Dg Chest 1 View  Result Date: 12/06/2018 CLINICAL DATA:  Followup left thoracentesis EXAM: CHEST  1 VIEW COMPARISON:  12/05/2018 FINDINGS: Thoracentesis on the left by history. There continues to be extensive opacity within the left hemithorax. There is some aerated lung in the lower chest. The degree of aeration of the lower lung is probably improved. There is no evidence of pneumothorax. Right chest remains clear. IMPRESSION: Some improvement in aeration of the left lower lung following left thoracentesis. No pneumothorax. Electronically Signed   By: Nelson Chimes M.D.   On: 12/06/2018 10:56   Dg Chest 1 View  Result Date: 11/30/2018 CLINICAL DATA:  Status post left thoracentesis. EXAM: CHEST  1 VIEW COMPARISON:  November 30, 2018 FINDINGS: A significant left-sided pleural effusion remains. No pneumothorax after thoracentesis. The right lung is clear. IMPRESSION: Significant effusion and underlying opacity remains on the left. However, no pneumothorax is seen after left-sided thoracentesis. Electronically Signed   By: Dorise Bullion III M.D   On: 11/30/2018 11:28   Dg Chest 1 View  Result Date: 11/21/2018 CLINICAL DATA:  Follow-up thoracentesis EXAM: CHEST  1 VIEW COMPARISON:  11/20/2018 FINDINGS: Right chest remains clear. Opacification of the left lung appears progressive. The amount of pleural density appears similar. No evidence of pneumothorax. IMPRESSION: Similar appearance of pleural density on the left. Worsened opacity of the left lung. Electronically Signed   By: Nelson Chimes M.D.   On: 11/21/2018 12:31   Dg Chest 2 View  Result Date: 11/30/2018 CLINICAL DATA:  Shortness of breath EXAM: CHEST - 2 VIEW COMPARISON:  11/21/2018 FINDINGS: Continued near complete  opacification of the left hemithorax with left pleural density and airspace disease. Right lung clear. There is cardiomegaly. No acute bony abnormality. IMPRESSION: Continued near complete opacification of the left hemithorax with diffuse left lung airspace disease and left pleural density, presumably effusion. Electronically Signed   By: Rolm Baptise M.D.   On: 11/30/2018 02:27   Dg Chest 2 View  Result Date: 11/20/2018 CLINICAL DATA:  Chest pain EXAM: CHEST - 2 VIEW COMPARISON:  06/26/2017 FINDINGS: Large left pleural effusion with basilar and apical components. Approximately 50% volume loss within the left lung. Left chest surgical clips. Right lung is clear. IMPRESSION: Large left pleural effusion with basilar and apical components with moderate left lung volume loss. Electronically Signed   By: Ulyses Jarred M.D.   On: 11/20/2018 17:51   Ct Chest W Contrast  Result Date: 11/30/2018 CLINICAL DATA:  63 year old male with history of non-small cell lung cancer. Staging examination. EXAM: CT CHEST, ABDOMEN, AND PELVIS WITH CONTRAST TECHNIQUE: Multidetector CT imaging of the chest, abdomen and pelvis was performed following the standard protocol during bolus administration of intravenous  contrast. CONTRAST:  51m OMNIPAQUE IOHEXOL 300 MG/ML  SOLN COMPARISON:  PET-CT 04/25/2017. FINDINGS: CT CHEST FINDINGS Cardiovascular: Heart size is mildly enlarged. There is no significant pericardial fluid, thickening or pericardial calcification. There is aortic atherosclerosis, as well as atherosclerosis of the great vessels of the mediastinum and the coronary arteries, including calcified atherosclerotic plaque in the left main, left anterior descending, left circumflex and right coronary arteries. Mediastinum/Nodes: No pathologically enlarged mediastinal or hilar lymph nodes. Esophagus is unremarkable in appearance. No axillary lymphadenopathy. Lungs/Pleura: Previously noted left upper lobe nodule is no longer clearly  identifiable. Several fiducial markers are noted in the left upper lobe. On today's examination there is a new 7.8 x 9.5 x 8.3 cm mass-like area (axial image 21 of series 4 and coronal image 105 of series 6) which is centrally hypovascular with multiple internal locules of gas, favored to represent a large centrally necrotic mass, likely with superinfection (i.e., a large pulmonary abscess) with some adjacent postobstructive changes in the remaining portions of the left upper lobe. This is associated with outward bowing of the left major fissure. Some consolidative changes in volume loss are noted in the anterior and lateral aspect of the left lower lobe. Moderate left pleural effusion predominantly loculated in a subpulmonic position. New right lower lobe pulmonary nodule (axial image 131 of series 5) measuring 2.8 x 1.8 cm, concerning for metastatic lesion. No right pleural effusion. Musculoskeletal: Lytic lesion in the left side of the manubrium measuring 2.5 x 1.7 cm (axial image 13 of series 4). CT ABDOMEN PELVIS FINDINGS Hepatobiliary: No suspicious cystic or solid hepatic lesions. No intra or extrahepatic biliary ductal dilatation. Gallbladder is unremarkable in appearance. Pancreas: No pancreatic mass. No pancreatic ductal dilatation. No pancreatic or peripancreatic fluid or inflammatory changes. Spleen: Unremarkable. Adrenals/Urinary Tract: Multiple new right adrenal nodules and masses, compatible with metastatic disease, largest of which measures 2.8 x 3.8 cm (axial image 71 of series 4). Left adrenal gland is normal in appearance. Left kidney is normal in appearance. Multiple low-attenuation lesions in the right kidney, compatible with simple cysts measuring up to 4.2 cm in the interpolar region. In the lower pole of the right kidney there is also an exophytic intermediate attenuation lesion which is incompletely characterized, but similar to prior PET-CT 04/25/2017, favored to represent a mildly  proteinaceous/hemorrhagic cyst. No hydroureteronephrosis. Urinary bladder is normal in appearance. Stomach/Bowel: Normal appearance of the stomach. No pathologic dilatation of small bowel or colon. Numerous colonic diverticulae are noted, particularly in the proximal sigmoid colon, without surrounding inflammatory changes to suggest an acute diverticulitis at this time. Normal appendix. Vascular/Lymphatic: Aortic atherosclerosis, without evidence of aneurysm or dissection in the abdominal or pelvic vasculature. No lymphadenopathy noted in the abdomen or pelvis. Reproductive: Prostate gland and seminal vesicles are unremarkable in appearance. Other: No significant volume of ascites.  No pneumoperitoneum. Musculoskeletal: Large lytic lesion in the right side of the S1 vertebra measuring 3.2 x 4.2 cm (axial image 97 of series 4). IMPRESSION: 1. Large centrally necrotic mass in the left upper lobe, likely to reflect treated neoplasm. Given the appearance of this lesion on today's examination, this is favored to reflect a large pulmonary abscess at this time. This is associated with a moderate loculated left pleural effusion, largest component of which is subpulmonic in position. 2. New 2.8 x 1.8 cm right lower lobe pulmonary nodule, multiple right adrenal nodules and masses, and new lytic lesions in the left side of the manubrium and the right S1 vertebra,  compatible with widespread metastatic disease. 3. Colonic diverticulosis without evidence of acute diverticulitis at this time. 4. Aortic atherosclerosis, in addition to left main and 3 vessel coronary artery disease. Please note that although the presence of coronary artery calcium documents the presence of coronary artery disease, the severity of this disease and any potential stenosis cannot be assessed on this non-gated CT examination. Assessment for potential risk factor modification, dietary therapy or pharmacologic therapy may be warranted, if clinically  indicated. 5. Additional incidental findings, as above. Electronically Signed   By: Vinnie Langton M.D.   On: 11/30/2018 12:02   Ct Abdomen Pelvis W Contrast  Result Date: 11/30/2018 CLINICAL DATA:  63 year old male with history of non-small cell lung cancer. Staging examination. EXAM: CT CHEST, ABDOMEN, AND PELVIS WITH CONTRAST TECHNIQUE: Multidetector CT imaging of the chest, abdomen and pelvis was performed following the standard protocol during bolus administration of intravenous contrast. CONTRAST:  51m OMNIPAQUE IOHEXOL 300 MG/ML  SOLN COMPARISON:  PET-CT 04/25/2017. FINDINGS: CT CHEST FINDINGS Cardiovascular: Heart size is mildly enlarged. There is no significant pericardial fluid, thickening or pericardial calcification. There is aortic atherosclerosis, as well as atherosclerosis of the great vessels of the mediastinum and the coronary arteries, including calcified atherosclerotic plaque in the left main, left anterior descending, left circumflex and right coronary arteries. Mediastinum/Nodes: No pathologically enlarged mediastinal or hilar lymph nodes. Esophagus is unremarkable in appearance. No axillary lymphadenopathy. Lungs/Pleura: Previously noted left upper lobe nodule is no longer clearly identifiable. Several fiducial markers are noted in the left upper lobe. On today's examination there is a new 7.8 x 9.5 x 8.3 cm mass-like area (axial image 21 of series 4 and coronal image 105 of series 6) which is centrally hypovascular with multiple internal locules of gas, favored to represent a large centrally necrotic mass, likely with superinfection (i.e., a large pulmonary abscess) with some adjacent postobstructive changes in the remaining portions of the left upper lobe. This is associated with outward bowing of the left major fissure. Some consolidative changes in volume loss are noted in the anterior and lateral aspect of the left lower lobe. Moderate left pleural effusion predominantly loculated  in a subpulmonic position. New right lower lobe pulmonary nodule (axial image 131 of series 5) measuring 2.8 x 1.8 cm, concerning for metastatic lesion. No right pleural effusion. Musculoskeletal: Lytic lesion in the left side of the manubrium measuring 2.5 x 1.7 cm (axial image 13 of series 4). CT ABDOMEN PELVIS FINDINGS Hepatobiliary: No suspicious cystic or solid hepatic lesions. No intra or extrahepatic biliary ductal dilatation. Gallbladder is unremarkable in appearance. Pancreas: No pancreatic mass. No pancreatic ductal dilatation. No pancreatic or peripancreatic fluid or inflammatory changes. Spleen: Unremarkable. Adrenals/Urinary Tract: Multiple new right adrenal nodules and masses, compatible with metastatic disease, largest of which measures 2.8 x 3.8 cm (axial image 71 of series 4). Left adrenal gland is normal in appearance. Left kidney is normal in appearance. Multiple low-attenuation lesions in the right kidney, compatible with simple cysts measuring up to 4.2 cm in the interpolar region. In the lower pole of the right kidney there is also an exophytic intermediate attenuation lesion which is incompletely characterized, but similar to prior PET-CT 04/25/2017, favored to represent a mildly proteinaceous/hemorrhagic cyst. No hydroureteronephrosis. Urinary bladder is normal in appearance. Stomach/Bowel: Normal appearance of the stomach. No pathologic dilatation of small bowel or colon. Numerous colonic diverticulae are noted, particularly in the proximal sigmoid colon, without surrounding inflammatory changes to suggest an acute diverticulitis at this time.  Normal appendix. Vascular/Lymphatic: Aortic atherosclerosis, without evidence of aneurysm or dissection in the abdominal or pelvic vasculature. No lymphadenopathy noted in the abdomen or pelvis. Reproductive: Prostate gland and seminal vesicles are unremarkable in appearance. Other: No significant volume of ascites.  No pneumoperitoneum.  Musculoskeletal: Large lytic lesion in the right side of the S1 vertebra measuring 3.2 x 4.2 cm (axial image 97 of series 4). IMPRESSION: 1. Large centrally necrotic mass in the left upper lobe, likely to reflect treated neoplasm. Given the appearance of this lesion on today's examination, this is favored to reflect a large pulmonary abscess at this time. This is associated with a moderate loculated left pleural effusion, largest component of which is subpulmonic in position. 2. New 2.8 x 1.8 cm right lower lobe pulmonary nodule, multiple right adrenal nodules and masses, and new lytic lesions in the left side of the manubrium and the right S1 vertebra, compatible with widespread metastatic disease. 3. Colonic diverticulosis without evidence of acute diverticulitis at this time. 4. Aortic atherosclerosis, in addition to left main and 3 vessel coronary artery disease. Please note that although the presence of coronary artery calcium documents the presence of coronary artery disease, the severity of this disease and any potential stenosis cannot be assessed on this non-gated CT examination. Assessment for potential risk factor modification, dietary therapy or pharmacologic therapy may be warranted, if clinically indicated. 5. Additional incidental findings, as above. Electronically Signed   By: Vinnie Langton M.D.   On: 11/30/2018 12:02   Dg Chest Port 1 View  Result Date: 12/05/2018 CLINICAL DATA:  Pleural effusion. EXAM: PORTABLE CHEST 1 VIEW COMPARISON:  CT scan and radiograph of November 30, 2018. FINDINGS: Stable near complete opacification of left hemithorax is noted, with stable aerated lung seen in the left lower lobe. This is most consistent with pneumonia or atelectasis with associated pleural effusion. Mild mediastinal shift from right to left is noted. Atherosclerosis of thoracic aorta is noted. No pneumothorax is noted. Bony thorax is unremarkable. IMPRESSION: Stable left lung findings as described  above. Aortic Atherosclerosis (ICD10-I70.0). Electronically Signed   By: Marijo Conception M.D.   On: 12/05/2018 07:41   Nm Scan Tumor Localize With Spect  Result Date: 11/25/2018 CLINICAL DATA:  HEART FAILURE. CONCERN FOR CARDIAC AMYLOIDOSIS. EXAM: NUCLEAR MEDICINE TUMOR LOCALIZATION. PYP CARDIAC AMYLOIDOSIS SCAN WITH SPECT TECHNIQUE: Following intravenous administration of radiopharmaceutical, anterior planar images of the chest were obtained. Regions of interest were placed on the heart and contralateral chest wall for quantitative assessment. Additional SPECT imaging of the chest was obtained. RADIOPHARMACEUTICALS:  21.7 mCi TECHNETIUM 99 PYROPHOSPHATE FINDINGS: Planar Visual assessment: Anterior planar imaging demonstrates radiotracer uptake within the heart less than than uptake within the adjacent ribs (Grade 1). Quantitative assessment : Quantitative assessment of the cardiac uptake compared to the contralateral chest wall is equal to 1.0 (H/CL = 1.0). SPECT assessment: SPECT imaging of the chest demonstrates minimal radiotracer accumulation within the LEFT ventricle. IMPRESSION: Visual and quantitative assessment (grade 1, H/CLL equal 1.0) are NOT suggestive of transthyretin amyloidosis. Electronically Signed   By: Suzy Bouchard M.D.   On: 11/25/2018 14:33   Ir Thoracentesis Asp Pleural Space W/img Guide  Result Date: 12/06/2018 INDICATION: History of lung cancer. Recurrent left-sided pleural effusion. Request therapeutic thoracentesis. EXAM: ULTRASOUND GUIDED LEFT THORACENTESIS MEDICATIONS: None. COMPLICATIONS: None immediate. PROCEDURE: An ultrasound guided thoracentesis was thoroughly discussed with the patient and questions answered. The benefits, risks, alternatives and complications were also discussed. The patient understands and wishes to  proceed with the procedure. Written consent was obtained. Ultrasound of the left chest demonstrates that the effusion has become severely loculated in the  interval since previous procedure. Ultrasound was performed to localize and mark an adequate pocket of fluid in the left chest. The area was then prepped and draped in the normal sterile fashion. 1% Lidocaine was used for local anesthesia. Under ultrasound guidance a 6 Fr Safe-T-Centesis catheter was introduced. Thoracentesis was performed. The catheter was removed and a dressing applied. FINDINGS: A total of approximately 800 mL of clear, dark yellow fluid was removed. IMPRESSION: Successful ultrasound guided left thoracentesis yielding 800 mL of pleural fluid. Left pleural effusion has become markedly loculated in the interval. Read by: Ascencion Dike PA-C Electronically Signed   By: Markus Daft M.D.   On: 12/06/2018 10:40   Ir Thoracentesis Asp Pleural Space W/img Guide  Result Date: 11/21/2018 INDICATION: Patient with history of lung cancer, now with pleural effusion. Request is made for diagnostic and therapeutic left thoracentesis. EXAM: ULTRASOUND GUIDED DIAGNOSTIC AND THERAPEUTIC LEFT THORACENTESIS MEDICATIONS: 10 mL 1% lidocaine COMPLICATIONS: None immediate. PROCEDURE: An ultrasound guided thoracentesis was thoroughly discussed with the patient and questions answered. The benefits, risks, alternatives and complications were also discussed. The patient understands and wishes to proceed with the procedure. Written consent was obtained. Ultrasound was performed to localize and mark an adequate pocket of fluid in the left chest. The area was then prepped and draped in the normal sterile fashion. 1% Lidocaine was used for local anesthesia. Under ultrasound guidance a 6 Fr Safe-T-Centesis catheter was introduced. Thoracentesis was performed. The catheter was removed and a dressing applied. FINDINGS: A total of approximately 1.3 liters of yellow fluid was removed. Samples were sent to the laboratory as requested by the clinical team. IMPRESSION: Successful ultrasound guided diagnostic and therapeutic left  thoracentesis yielding 1.3 liters of pleural fluid. Read by: Brynda Greathouse PA-C Electronically Signed   By: Markus Daft M.D.   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 ED this morning with complaints of weakness and dyspnea. Noted to have recurrent left pleural effusion - last thoracentesis 11/21/18 yielding 1.3 L pleural fluid. Request has been made to IR for diagnostic and therapeutic thoracentesis. EXAM: ULTRASOUND GUIDED LEFT THORACENTESIS MEDICATIONS: 8 mL 1% lidocaine. COMPLICATIONS: None immediate. PROCEDURE: An ultrasound guided thoracentesis was thoroughly discussed with the patient and questions answered. The benefits, risks, alternatives and complications were also discussed. The patient understands and wishes to proceed with the procedure. Written consent was obtained. Ultrasound was performed to localize and mark an adequate pocket of fluid in the left chest. The area was then prepped and draped in the normal sterile fashion. 1% Lidocaine was used for local anesthesia. Under ultrasound guidance a 6 Fr Safe-T-Centesis catheter was introduced. Thoracentesis was performed. The catheter was removed and a dressing applied. FINDINGS: A total of approximately 1.9 L of golden yellow fluid was removed. Procedure was aborted due to patient complaints of chest pain - some residual fluid remains on post procedure ultrasound. samples were sent to the laboratory as requested by the clinical team. IMPRESSION: Successful ultrasound guided left thoracentesis yielding 1.9 L of pleural fluid. Read by Candiss Norse, PA-C Electronically Signed   By: Aletta Edouard M.D.   On: 11/30/2018 10:51  Subjective: Initial this morning patient want to pursue discharge with hospice at home.  He has now elected for  discharge with home health and PT he will follow-up with oncology as an outpatient for further evaluation.  Discharge Exam: Vitals:   12/08/18 0742 12/08/18 0743  BP:    Pulse:  64  Resp:  18  Temp:    SpO2: 95% 95%   Vitals:   12/07/18 2151 12/08/18 0616 12/08/18 0742 12/08/18 0743  BP: (!) 185/67 (!) 166/54    Pulse: 69 64  64  Resp: _0 Temp: 98 F (36.7 C) 97.9 F (36.6 C)    TempSrc: Oral Oral    SpO2: 97% 97% 95% 95%  Weight:  90.7 kg    Height:        General: Pt is alert, awake, not in acute distress Cardiovascular: RRR, S1/S2 +, no rubs, no gallops Respiratory: CTA bilaterally, no wheezing, no rhonchi mild rales left greater than right, no accessory muscle use Abdominal: Soft, NT, ND, bowel sounds + Extremities: no edema, no cyanosis    The results of significant diagnostics from this hospitalization (including imaging, microbiology, ancillary and laboratory) are listed below for reference.     Microbiology: Recent Results (from the past 240 hour(s))  Culture, body fluid-bottle     Status: None   Collection Time: 11/30/18 10:56 AM  Result Value Ref Range Status   Specimen Description PLEURAL LEFT  Final   Special Requests NONE  Final   Culture   Final    NO GROWTH 5 DAYS Performed at [facility], [address] [place], [place] [address]    Report Status 12/05/2018 FINAL  Final  Gram stain     Status: None   Collection Time: 11/30/18 10:56 AM  Result Value Ref Range Status   Specimen Description PLEURAL LEFT  Final   Special Requests NONE  Final   Gram Stain   Final    RARE WBC PRESENT,BOTH PMN AND MONONUCLEAR NO ORGANISMS SEEN Performed at [facility], [place] [address], [place], [place] [address]    Report Status 12/01/2018 FINAL  Final     Labs: BNP (last 3 results) Recent Labs    11/20/18 2034 11/30/18 0018  BNP 1,440.6* 505.6*   Basic Metabolic Panel: Recent Labs  Lab 12/02/18 0420 12/03/18 0337 12/04/18 0227  12/05/18 0339  NA 135 135 136 136  K 3.8 4.0 4.2 4.1  CL 102 102 103 103  CO2 _1 GLUCOSE 117* 171* 105* 100*  BUN 22 24* 25* 26*  CREATININE 1.39* 1.56* 1.30* 1.35*  CALCIUM 8.1* 8.0* 8.0* 8.2*   Liver Function Tests: Recent Labs  Lab 12/05/18 0339  AST 28  ALT 21  ALKPHOS 173*  BILITOT 0.6  PROT 6.0*  ALBUMIN 1.4*   No results for input(s): LIPASE, AMYLASE in the last 168 hours. No results for input(s): AMMONIA in the last 168 hours. CBC: Recent Labs  Lab 12/02/18 0420 12/03/18 0337 12/04/18 0227 12/05/18 0339 12/06/18 0314 12/06/18 1839 12/07/18 0220 12/08/18 0332  WBC 9.5 8.2 6.1 7.0  --   --   --   --   NEUTROABS  --   --   --  5.6  --   --   --   --   HGB 7.3* 7.1* 6.1* 7.1* 6.9* 7.6* 7.6* 7.3*  HCT 24.2* 23.4* 20.6* 23.2* 22.5* 24.3* 24.6* 23.2*  MCV 77.6* 78.3* 77.7* 79.5*  --   --   --   --  PLT 296 295 262 284  --   --   --   --    Cardiac Enzymes: No results for input(s): CKTOTAL, CKMB, CKMBINDEX, TROPONINI in the last 168 hours. BNP: Invalid input(s): POCBNP CBG: Recent Labs  Lab 12/07/18 0740 12/07/18 1114 12/07/18 1614 12/07/18 2148 12/08/18 0739  GLUCAP 98 129* 126* 158* 111*   D-Dimer No results for input(s): DDIMER in the last 72 hours. Hgb A1c No results for input(s): HGBA1C in the last 72 hours. Lipid Profile No results for input(s): CHOL, HDL, LDLCALC, TRIG, CHOLHDL, LDLDIRECT in the last 72 hours. Thyroid function studies No results for input(s): TSH, T4TOTAL, T3FREE, THYROIDAB in the last 72 hours.  Invalid input(s): FREET3 Anemia work up No results for input(s): VITAMINB12, FOLATE, FERRITIN, TIBC, IRON, RETICCTPCT in the last 72 hours. Urinalysis    Component Value Date/Time   COLORURINE YELLOW 11/29/2018 1940   APPEARANCEUR CLEAR 11/29/2018 1940   LABSPEC 1.013 11/29/2018 1940   PHURINE 6.0 11/29/2018 1940   GLUCOSEU 50 (A) 11/29/2018 1940   GLUCOSEU NEGATIVE 05/10/2016 1434   HGBUR NEGATIVE 11/29/2018 1940    BILIRUBINUR NEGATIVE 11/29/2018 1940   KETONESUR NEGATIVE 11/29/2018 1940   PROTEINUR 100 (A) 11/29/2018 1940   UROBILINOGEN 0.2 05/10/2016 1434   NITRITE NEGATIVE 11/29/2018 1940   LEUKOCYTESUR TRACE (A) 11/29/2018 1940   Sepsis Labs Invalid input(s): PROCALCITONIN,  WBC,  LACTICIDVEN Microbiology Recent Results (from the past 240 hour(s))  Culture, body fluid-bottle     Status: None   Collection Time: 11/30/18 10:56 AM  Result Value Ref Range Status   Specimen Description PLEURAL LEFT  Final   Special Requests NONE  Final   Culture   Final    NO GROWTH 5 DAYS Performed at [facility], [place] [address], [place], [place] [address]    Report Status 12/05/2018 FINAL  Final  Gram stain     Status: None   Collection Time: 11/30/18 10:56 AM  Result Value Ref Range Status   Specimen Description PLEURAL LEFT  Final   Special Requests NONE  Final   Gram Stain   Final    RARE WBC PRESENT,BOTH PMN AND MONONUCLEAR NO ORGANISMS SEEN Performed at [facility], [place] [address] [place], [place] [address]    Report Status 12/01/2018 FINAL  Final     Time coordinating discharge: 35 minutes  SIGNED:   Nicolette Bang, MD  [facility] 12/08/2018, 11:42 AM Pager   If 7PM-7AM, please contact night-coverage [email or web address] Password [email or web address]

## 2018-12-08 NOTE — TOC Transition Note (Addendum)
Transition of Care [facility]) - CM/SW Discharge Note   Patient Details  Name: Brad [name] MRN: [id number] Date of Birth: 1956-03-16  [facility]) CM/SW Contact:  Claudie Leach, RN Phone Number: 12/08/2018, 11:35 AM   Clinical Narrative:    Discussed plans with patient again at request of Dr. Wyonia Hough.  At one point in the conversation, patient states he wants to pursue chemo then at times makes comments such as "I don't want that stuff"(chemo).  Pt asking questions like "how will it make me feel?" and "what about that other treatment (radiation)"? Pt also had a conflicting conversation with Dr. Wyonia Hough, stating he wants hospice, end of life care and no chemo. By the end of our conversation, patient stated "I will try it (chemo).  If it makes me feel bad, I will quit."   Patient alert and oriented, but seems not to completely understand the options and would benefit from a lengthy conversation about options with support system to help him retain information and make decisions.    Pt is arranged with [facility] for Palliative services.  [facility] also can provide hospice.  As the patient is unsure, Dr. Wyonia Hough agrees that we will discharge with the current plan and he can easily transition to hospice if he wishes after he meets with oncology.    RW is ordered for the patient.   Final next level of care: [place] Barriers to Discharge: Barriers Resolved, Other (comment)(Pt is unclear about intentions to pursue chemotherapy treatment and hospice vs. palliative)   Patient Goals and CMS Choice Patient states their goals for this hospitalization and ongoing recovery are:: to "get out of here" CMS Medicare.gov Compare Post Acute Care list provided to:: Patient Choice offered to / list presented to : Patient   Discharge Plan and Services In-house Referral: Clinical Social Work        DME;RW DME agency : [employer]   HH Arranged: RN,  Disease Management, PT, Social Work [employer] Agency: [place] Date HH Agency Contacted: 12/05/18 Time [place]: [id number] Representative spoke with at [place]: [place] ([place]) Interventions     Readmission Risk Interventions Readmission Risk Prevention Plan 12/06/2018 12/04/2018  Transportation Screening - Complete  HRI or [place] - Complete  Social Work Consult for [place] Planning/Counseling Complete -  Palliative Care Screening - Complete  Medication Review Press photographer) - Complete  Some recent data might be hidden

## 2018-12-08 NOTE — Progress Notes (Signed)
PALLIATIVE NOTE:  Patient sitting on side of bed, fully dressed. Reports he is being discharged today. States he is not sure if he is ready to go but understands from a medical standpoint he is medically stable. He denies pain or shortness of breath. Reports he is feeling much better than he did and even several days ago.   I reviewed our previous conversations on last week regarding his goals of care and how he wishes to proceed with his care once discharged. He states "I know I will have cancer and will die, it is just a matter of when. I just want to get home to my friends and go from there!" Support given.   I again explained to him the difference between hospice and palliative. He verbalized he is aware and remembers from our conversations previously. He pulls out printed material I had given him explaining hospice and palliative. He confirms wishes to remain a DNR/DNI. He is aware gold DNR sheet is on his chart and can be taken home with him to alert his medical providers of his wishes. I offered to discuss with other family members of his choice to provide updates and plans for discharge. Mr. Minter declined and stated he is keeping everyone update and he may have his friend or sister to come to Oncology appointment with him.  He is asking about appointments at the cancer center and transportation when it is time for those appointments. I advised once he is discharged his nurse will provide his discharge instructions and all appointments and information per Oncology will be reviewed. He states "I am going to give this chemo stuff a try again and we will see how it goes. If it buys me time why not but if I feel bad I am not doing it anymore!" Support given and patient knows he can make whatever decision he chooses. I encouraged him to stay closely connected to his Oncology medical team as well as his outpatient Palliative team that he will be assigned to. He expressed understanding and appreciation in  care.   He mentions that he does not have transportation home. He was hoping his brother in law would be able to assist however, he states they are unable to provide transportation due to brother-in-laws schedule today. Advised I will mention to RN to see if there is some assistance with transport. He also reports his keys are at home and his brother-in-law will have to assist him with getting in the house, but he is planning to get back in touch with him and make arrangements with that.   All questions answered.   Assessment-A&O x3, denies pain, shortness of breath, good appetite, normal S1S2, diminished in bases   Plan: DNR (completed form on chart), D/C home w/HH and palliative, f/u with Oncology and trial of chemo, Bedside, RN aware of transportation needs at discharge   Total Time: 35 min.   Greater than 50%  of this time was spent counseling and coordinating care related to the above assessment and plan.  Alda Lea, AGPCNP-BC Palliative Medicine Team  Phone: [phone] Pager: [phone] Amion: Bjorn Pippin

## 2018-12-08 NOTE — Progress Notes (Signed)
Pt had been pushing back on discharge today, stated his family would not be home until this evening. I told him at [place] he was going to have to be discharged and called cab to get pt home. When we went in with wheelchair he stated he could not leave he did not have a home to go to. He was on the phone with family member. He asked could he stay tonight and they said no he could not come back there. I let Dr. Wyonia Hough know. He stated to cancel discharge tonight. Cont to monitor. Carroll Kinds RN

## 2018-12-09 DIAGNOSIS — J918 Pleural effusion in other conditions classified elsewhere: Secondary | ICD-10-CM

## 2018-12-09 DIAGNOSIS — J189 Pneumonia, unspecified organism: Secondary | ICD-10-CM

## 2018-12-09 LAB — GLUCOSE, CAPILLARY
Glucose-Capillary: 158 mg/dL — ABNORMAL HIGH (ref 70–99)
Glucose-Capillary: 109 mg/dL — ABNORMAL HIGH (ref 70–99)

## 2018-12-09 MED ORDER — AMLODIPINE BESYLATE 5 MG PO TABS: 5.0000 mg | ORAL_TABLET | Freq: Every day | ORAL | 0 refills | Status: DC

## 2018-12-09 MED FILL — AMLODIPINE BESYLATE 5 MG TA: 5 | 30 days supply | Qty: 30 | Fill #0

## 2018-12-09 NOTE — Progress Notes (Signed)
CSW called and left a message for the patient's friend Diane to see if he could continue to stay at the residence until he can find a new place to go.   CSW spoke with the patient about having a Research officer, trade union, patient denied that he had a current Research officer, trade union.   CSW reached out to [place] at the outpatient cancer center to give her a heads up about the patient. She suggested that the CSW could reach out to [employer] and that she would look at the patient's information.   CSW reached out to Lessie Dings with [employer] to see if the patient could be a candidate for resources. CSW is awaiting to receive word back if they will be able to assist.   CSW spoke with lead CSW Denyse Amass about possible options. CSW Denyse Amass suggested the [facility].   CSW contacted the [facility] and was prompted to leave a message. CSW is awaiting a return phone call from the [facility] to see if they would be able to assist the patient.   CSW reached out to [place]. CSW is awaiting a return phone call from AD to discuss discharge options with the patient.   Domenic Schwab, MSW, [place]

## 2018-12-09 NOTE — Care Management Important Message (Signed)
Important Message  Patient Details  Name: Brad [name] MRN: [id number] Date of Birth: Dec 17, 1955   Medicare Important Message Given:  Yes    Larya Charpentier 12/09/2018, 4:01 PM

## 2018-12-09 NOTE — TOC Progression Note (Addendum)
Transition of Care [facility]) - Progression Note    Patient Details  Name: Brad [name] MRN: [id number] Date of Birth: [birth year]  [facility] CM/SW [place], [place] Phone Number: 12/09/2018, 1:20 PM  Clinical Narrative:     Patient is currently declining [place] assistance. Patient is declining going to a shelter or boarding house. The patient stated that he can afford to stay in a hotel for a few nights. Patient is agreeable and wanting to go to the [facility] for assistance with services.   Patient is wanting to discharge this afternoon. Patient stated that he is able to get his belongings. CSW spoke with the patient about finding out who his PO officer is and letting them know about his change in circumstances.   Patient is requesting to leave by cab to the [facility]. CSW will facilitate discharge and provide a cab voucher.     Expected Discharge Plan: [place] Barriers to Discharge: Barriers Resolved, Other (comment)(Pt is unclear about intentions to pursue chemotherapy treatment and hospice vs. palliative)  Expected Discharge Plan and Services Expected Discharge Plan: [place] In-house Referral: Clinical Social Work     Living arrangements for the past 2 months: Single Family Home Expected Discharge Date: 12/08/18               DME Arranged: [place] Rile rolling DME Agency: [employer] Date DME Agency Contacted: 12/08/18 Time DME Agency Contacted: [id number] Representative spoke with at DME Agency: [place]: RN, Disease Management, PT, Social Work [employer] Agency: [place] Date [place]: 12/05/18 Time [place]: [place] Representative spoke with at [place]: [place] ([place]) Interventions    Readmission Risk Interventions Readmission Risk Prevention Plan 12/06/2018 12/04/2018  Transportation Screening - Complete  HRI or [place] -  Complete  Social Work Consult for [place] Planning/Counseling Complete -  Palliative Care Screening - Complete  Medication Review Press photographer) - Complete  Some recent data might be hidden

## 2018-12-09 NOTE — Progress Notes (Signed)
Physician Discharge Summary  Brad [name] YBO:[id number] DOB: 04-Mar-1956 DOA: 11/29/2018  PCP: Biagio Borg, MD  Admit date: 11/29/2018 Discharge date: 12/09/2018  Admitted From: Inpatient Disposition: home  Recommendations for Outpatient Follow-up:  1. Follow up with PCP in 1-2 weeks 2. Please obtain BMP/CBC in one week 3. Please follow with cardiology as discussed  Home Health:Yes Equipment/Devices:none  Discharge Condition:Stable CODE STATUS:DNR Diet recommendation: Regular healthy diet   Pt was offered multiple options for discharge and treatment but has declined all.  He requested to be discharged today with plans to stay at a hotel.    Brief/Interim Summary PER D/C SUMM 5/3: 63 y.o.malewith medical history significant ofCVA; stage 3 SCC of the LUL s/phemoradiation and incomplete chemotherapy; chronic combined CHF (EF 45-50% with grade 2 DD in 2/17); hypothyoiridsm; HTN; HLD; DM; chronic hep C; and COPD presenting with weakness. Since hospital discharge, he was placed on his front porch and he wasn't able to move. He was unable to get up and go to the bathroom. His legs are just too weak/ Denies SOB. No cough. Regarding his cancer, was last seen by oncology in February 2019 then lost to follow-up. He was admitted from 4/15-22 from jail (incarcerated since 2/77/82[place] uncertain reason; prior conviction on 12/02/12 for taking indecent liberties with a child) with pleural effusion, negative for malignancy, thought to be due to CHF exacerbation. He was diuresed 18L during the hospitalization. He has h/o GI bleeding and anemia of chronic disease and his Hgb was as low as 6.6, requiring transfusion of 1 unit PRBC.  ED Course:Recently discharged to home from jail. Chronically elevated troponin, stable. Not clearly in CHF. No hypoxia. Had thoracentesis during prior hospitalization, now opacified. Too weak to go home, tachypnea with reaccummulation of fluid after last  hospitalization.  Hospital l course: Widely metastatic left lung cancer,as previously noted,CT scan 4/25 shows cancer worsening,"Large centrally necrotic mass in the left upper lobe, likely toreflect treated neoplasm. Given the appearance of this lesion on today's examination, this is favored to reflect a large pulmonary abscess at this time. This is associated with a moderate loculated left pleural effusion, largest component of which is subpulmonic in position and also with new 2.8 x 1.8 cm RLL pulm nodule, multiple right adrenal nodules and masses, and new lytic lesions in the left side of the manubrium and the right S1 vertebra".Patient was lost to follow-up was on concurrent chemoradiation in 2019 February. Oncology has been consultedand patient has agreed for follow-up as an outpatient in 1 to 2 weeks. Oncology is helping assist with transportation for that follow-up.Palliativecare met with the patient and initially he indicated he would not want to do chemotherapy also so it was unclear if a follow-up appoint will have much significance in terms of his outcome.  To date patient now has agreed to follow-up with oncology for evaluation as an outpatient  Large loculated left pleural effusionpatient underwent thoracentesis x2 with 1.9 L removed and 800 subsequently 5 days later Gram stain showed no growth fluid appeared exudative cytology was negative for malignant cells.  She was seen by CT surgery who advised intermittent thoracentesis as needed.  Consider Pleurx catheter if this persists.  Patient wanted to wait till he saw oncology before pursuing any further procedures.  Patient is very ambivalent about his plan of care fluctuating between hospice care at least discussing with oncology any options.  .Generalized Weakness: Multifactorial in the setting of metastatic cancer, anemia. PT OT evaluationappreciated, patient declined skilled nursing  facility but home health agency with PT  has been arranged.  Chronic combined systolic and diastolic CHF: TTE lvef 12-24%, severe concentric LVH, severe LAE moderate RA and concern for infiltrative process on recent echo. compensated. Patient overdiuresied on recent admission. Patient will restart his home medications on discharge.  COPD,stable continue bronchodilators.  Acute anemia of chronic disease/malignancy: Patient transfused packed unit of blood cells x3.  Hemoglobin is remained stable this will be an ongoing problem given patient's widespread cancer with metastatic disease  Prediabetes:Hha1c 6.2 stable. Controlled. Continue diabetic diet.  Hypothyroidismon levothyroxine, no hormonal dysregulation noted patient will continue his home dose.  Tobacco abuse: Cessation advised, unfortunately patient does continue to smoke although at this point with stage IV cancer is advanced  Essential hypertension: Stable on metoprolol, patient will continue his home medications on discharge  AKI: This was consistent with a prerenal azotemia picture.  Patient was mildly hydrated, creatinine stabilized no further work-up necessary.  PCP to continue monitoring patient's renal function.   History of CVA/PVD/Carotid endarterectomy, on aspirin will continue on discharge  History of duodenal ulcer- cont ppi she will continue on discharge  History of chronic hep C.No acute intervention currently monitor LFTs as needed  Recently discharged from jail. Has a monitoring device on 1 of his legs  Noncompliance/poor follow-up:Patient has agreed to follow-up with oncology   Discharge Diagnoses:  Principal Problem:   Weakness Active Problems:   Diabetes ([place])   Hypothyroidism   Tobacco abuse   Essential hypertension   HLD (hyperlipidemia)   Pleural effusion   Stage III squamous cell carcinoma of left lung (HCC)   AKI (acute kidney injury) (HCC)   Chronic combined systolic and diastolic CHF (congestive heart failure)  (HCC)   Lung cancer (HCC)   Pleural effusion associated with pulmonary infection    Discharge Instructions  Discharge Instructions    Diet Carb Modified   Complete by:  As directed    Discharge instructions   Complete by:  As directed    Return to local ER for any acute change in medical condition to include but not limited to nausea vomiting fevers chills chest pain shortness of breath.  Patient also follow-up with hematology oncology as discussed by [facility] consultation.   Increase activity slowly   Complete by:  As directed      Allergies as of 12/09/2018      Reactions   Doxycycline Hives, Itching   Lipitor [atorvastatin] Other (See Comments)   Myalgia   Oxycodone Itching      Medication List    TAKE these medications   amLODipine 5 MG tablet Commonly known as:  NORVASC Take 1 tablet (5 mg total) by mouth daily for 30 days.   aspirin EC 81 MG tablet Take 1 tablet (81 mg total) by mouth daily.   Blood Pressure Monitor Devi Use to check Blood pressure daily   ferrous sulfate 325 (65 FE) MG tablet Take 1 tablet (325 mg total) by mouth 3 (three) times daily with meals.   furosemide 80 MG tablet Commonly known as:  LASIX Take 1 tablet (80 mg total) by mouth daily for 30 days.   glucose blood test strip Commonly known as:  Accu-Chek Aviva Plus Use to check blood sugars daily Dx E11.9   levothyroxine 112 MCG tablet Commonly known as:  SYNTHROID Take 1 tablet (112 mcg total) daily by mouth.   metoprolol succinate 25 MG 24 hr tablet Commonly known as:  TOPROL-XL Take 1 tablet (25 mg  total) by mouth daily for 30 days.   pantoprazole 40 MG tablet Commonly known as:  PROTONIX Take 1 tablet (40 mg total) by mouth at bedtime for 30 days.   umeclidinium-vilanterol 62.5-25 MCG/INH Aepb Commonly known as:  Anoro Ellipta Inhale 1 puff into the lungs daily.            Durable Medical Equipment  (From admission, onward)         Start     Ordered   12/08/18  1131  For home use only DME Walker rolling  Once    Question:  Patient needs a walker to treat with the following condition  Answer:  Weakness   12/08/18 1131         Follow-up Information    [facility] Follow up.   WhyMalachy Mood [phone] please call this number- to make [facility] aware of your location.  Contact information: [place] [id number] [phone]          Allergies  Allergen Reactions  . Doxycycline Hives and Itching  . Lipitor [Atorvastatin] Other (See Comments)    Myalgia   . Oxycodone Itching    Consultations:  IR, PALLIATIVE ONCOLOGY   Procedures/Studies: Dg Chest 1 View  Result Date: 12/06/2018 CLINICAL DATA:  Followup left thoracentesis EXAM: CHEST  1 VIEW COMPARISON:  12/05/2018 FINDINGS: Thoracentesis on the left by history. There continues to be extensive opacity within the left hemithorax. There is some aerated lung in the lower chest. The degree of aeration of the lower lung is probably improved. There is no evidence of pneumothorax. Right chest remains clear. IMPRESSION: Some improvement in aeration of the left lower lung following left thoracentesis. No pneumothorax. Electronically Signed   By: Nelson Chimes M.D.   On: 12/06/2018 10:56   Dg Chest 1 View  Result Date: 11/30/2018 CLINICAL DATA:  Status post left thoracentesis. EXAM: CHEST  1 VIEW COMPARISON:  November 30, 2018 FINDINGS: A significant left-sided pleural effusion remains. No pneumothorax after thoracentesis. The right lung is clear. IMPRESSION: Significant effusion and underlying opacity remains on the left. However, no pneumothorax is seen after left-sided thoracentesis. Electronically Signed   By: Dorise Bullion III M.D   On: 11/30/2018 11:28   Dg Chest 1 View  Result Date: 11/21/2018 CLINICAL DATA:  Follow-up thoracentesis EXAM: CHEST  1 VIEW COMPARISON:  11/20/2018 FINDINGS: Right chest remains clear. Opacification of the left lung appears  progressive. The amount of pleural density appears similar. No evidence of pneumothorax. IMPRESSION: Similar appearance of pleural density on the left. Worsened opacity of the left lung. Electronically Signed   By: Nelson Chimes M.D.   On: 11/21/2018 12:31   Dg Chest 2 View  Result Date: 11/30/2018 CLINICAL DATA:  Shortness of breath EXAM: CHEST - 2 VIEW COMPARISON:  11/21/2018 FINDINGS: Continued near complete opacification of the left hemithorax with left pleural density and airspace disease. Right lung clear. There is cardiomegaly. No acute bony abnormality. IMPRESSION: Continued near complete opacification of the left hemithorax with diffuse left lung airspace disease and left pleural density, presumably effusion. Electronically Signed   By: Rolm Baptise M.D.   On: 11/30/2018 02:27   Dg Chest 2 View  Result Date: 11/20/2018 CLINICAL DATA:  Chest pain EXAM: CHEST - 2 VIEW COMPARISON:  06/26/2017 FINDINGS: Large left pleural effusion with basilar and apical components. Approximately 50% volume loss within the left lung. Left chest surgical clips. Right lung is clear. IMPRESSION: Large left  pleural effusion with basilar and apical components with moderate left lung volume loss. Electronically Signed   By: Ulyses Jarred M.D.   On: 11/20/2018 17:51   Ct Chest W Contrast  Result Date: 11/30/2018 CLINICAL DATA:  63 year old male with history of non-small cell lung cancer. Staging examination. EXAM: CT CHEST, ABDOMEN, AND PELVIS WITH CONTRAST TECHNIQUE: Multidetector CT imaging of the chest, abdomen and pelvis was performed following the standard protocol during bolus administration of intravenous contrast. CONTRAST:  45m OMNIPAQUE IOHEXOL 300 MG/ML  SOLN COMPARISON:  PET-CT 04/25/2017. FINDINGS: CT CHEST FINDINGS Cardiovascular: Heart size is mildly enlarged. There is no significant pericardial fluid, thickening or pericardial calcification. There is aortic atherosclerosis, as well as atherosclerosis of the  great vessels of the mediastinum and the coronary arteries, including calcified atherosclerotic plaque in the left main, left anterior descending, left circumflex and right coronary arteries. Mediastinum/Nodes: No pathologically enlarged mediastinal or hilar lymph nodes. Esophagus is unremarkable in appearance. No axillary lymphadenopathy. Lungs/Pleura: Previously noted left upper lobe nodule is no longer clearly identifiable. Several fiducial markers are noted in the left upper lobe. On today's examination there is a new 7.8 x 9.5 x 8.3 cm mass-like area (axial image 21 of series 4 and coronal image 105 of series 6) which is centrally hypovascular with multiple internal locules of gas, favored to represent a large centrally necrotic mass, likely with superinfection (i.e., a large pulmonary abscess) with some adjacent postobstructive changes in the remaining portions of the left upper lobe. This is associated with outward bowing of the left major fissure. Some consolidative changes in volume loss are noted in the anterior and lateral aspect of the left lower lobe. Moderate left pleural effusion predominantly loculated in a subpulmonic position. New right lower lobe pulmonary nodule (axial image 131 of series 5) measuring 2.8 x 1.8 cm, concerning for metastatic lesion. No right pleural effusion. Musculoskeletal: Lytic lesion in the left side of the manubrium measuring 2.5 x 1.7 cm (axial image 13 of series 4). CT ABDOMEN PELVIS FINDINGS Hepatobiliary: No suspicious cystic or solid hepatic lesions. No intra or extrahepatic biliary ductal dilatation. Gallbladder is unremarkable in appearance. Pancreas: No pancreatic mass. No pancreatic ductal dilatation. No pancreatic or peripancreatic fluid or inflammatory changes. Spleen: Unremarkable. Adrenals/Urinary Tract: Multiple new right adrenal nodules and masses, compatible with metastatic disease, largest of which measures 2.8 x 3.8 cm (axial image 71 of series 4). Left  adrenal gland is normal in appearance. Left kidney is normal in appearance. Multiple low-attenuation lesions in the right kidney, compatible with simple cysts measuring up to 4.2 cm in the interpolar region. In the lower pole of the right kidney there is also an exophytic intermediate attenuation lesion which is incompletely characterized, but similar to prior PET-CT 04/25/2017, favored to represent a mildly proteinaceous/hemorrhagic cyst. No hydroureteronephrosis. Urinary bladder is normal in appearance. Stomach/Bowel: Normal appearance of the stomach. No pathologic dilatation of small bowel or colon. Numerous colonic diverticulae are noted, particularly in the proximal sigmoid colon, without surrounding inflammatory changes to suggest an acute diverticulitis at this time. Normal appendix. Vascular/Lymphatic: Aortic atherosclerosis, without evidence of aneurysm or dissection in the abdominal or pelvic vasculature. No lymphadenopathy noted in the abdomen or pelvis. Reproductive: Prostate gland and seminal vesicles are unremarkable in appearance. Other: No significant volume of ascites.  No pneumoperitoneum. Musculoskeletal: Large lytic lesion in the right side of the S1 vertebra measuring 3.2 x 4.2 cm (axial image 97 of series 4). IMPRESSION: 1. Large centrally necrotic mass in the  left upper lobe, likely to reflect treated neoplasm. Given the appearance of this lesion on today's examination, this is favored to reflect a large pulmonary abscess at this time. This is associated with a moderate loculated left pleural effusion, largest component of which is subpulmonic in position. 2. New 2.8 x 1.8 cm right lower lobe pulmonary nodule, multiple right adrenal nodules and masses, and new lytic lesions in the left side of the manubrium and the right S1 vertebra, compatible with widespread metastatic disease. 3. Colonic diverticulosis without evidence of acute diverticulitis at this time. 4. Aortic atherosclerosis, in  addition to left main and 3 vessel coronary artery disease. Please note that although the presence of coronary artery calcium documents the presence of coronary artery disease, the severity of this disease and any potential stenosis cannot be assessed on this non-gated CT examination. Assessment for potential risk factor modification, dietary therapy or pharmacologic therapy may be warranted, if clinically indicated. 5. Additional incidental findings, as above. Electronically Signed   By: Vinnie Langton M.D.   On: 11/30/2018 12:02   Ct Abdomen Pelvis W Contrast  Result Date: 11/30/2018 CLINICAL DATA:  63 year old male with history of non-small cell lung cancer. Staging examination. EXAM: CT CHEST, ABDOMEN, AND PELVIS WITH CONTRAST TECHNIQUE: Multidetector CT imaging of the chest, abdomen and pelvis was performed following the standard protocol during bolus administration of intravenous contrast. CONTRAST:  65m OMNIPAQUE IOHEXOL 300 MG/ML  SOLN COMPARISON:  PET-CT 04/25/2017. FINDINGS: CT CHEST FINDINGS Cardiovascular: Heart size is mildly enlarged. There is no significant pericardial fluid, thickening or pericardial calcification. There is aortic atherosclerosis, as well as atherosclerosis of the great vessels of the mediastinum and the coronary arteries, including calcified atherosclerotic plaque in the left main, left anterior descending, left circumflex and right coronary arteries. Mediastinum/Nodes: No pathologically enlarged mediastinal or hilar lymph nodes. Esophagus is unremarkable in appearance. No axillary lymphadenopathy. Lungs/Pleura: Previously noted left upper lobe nodule is no longer clearly identifiable. Several fiducial markers are noted in the left upper lobe. On today's examination there is a new 7.8 x 9.5 x 8.3 cm mass-like area (axial image 21 of series 4 and coronal image 105 of series 6) which is centrally hypovascular with multiple internal locules of gas, favored to represent a large  centrally necrotic mass, likely with superinfection (i.e., a large pulmonary abscess) with some adjacent postobstructive changes in the remaining portions of the left upper lobe. This is associated with outward bowing of the left major fissure. Some consolidative changes in volume loss are noted in the anterior and lateral aspect of the left lower lobe. Moderate left pleural effusion predominantly loculated in a subpulmonic position. New right lower lobe pulmonary nodule (axial image 131 of series 5) measuring 2.8 x 1.8 cm, concerning for metastatic lesion. No right pleural effusion. Musculoskeletal: Lytic lesion in the left side of the manubrium measuring 2.5 x 1.7 cm (axial image 13 of series 4). CT ABDOMEN PELVIS FINDINGS Hepatobiliary: No suspicious cystic or solid hepatic lesions. No intra or extrahepatic biliary ductal dilatation. Gallbladder is unremarkable in appearance. Pancreas: No pancreatic mass. No pancreatic ductal dilatation. No pancreatic or peripancreatic fluid or inflammatory changes. Spleen: Unremarkable. Adrenals/Urinary Tract: Multiple new right adrenal nodules and masses, compatible with metastatic disease, largest of which measures 2.8 x 3.8 cm (axial image 71 of series 4). Left adrenal gland is normal in appearance. Left kidney is normal in appearance. Multiple low-attenuation lesions in the right kidney, compatible with simple cysts measuring up to 4.2 cm in the  interpolar region. In the lower pole of the right kidney there is also an exophytic intermediate attenuation lesion which is incompletely characterized, but similar to prior PET-CT 04/25/2017, favored to represent a mildly proteinaceous/hemorrhagic cyst. No hydroureteronephrosis. Urinary bladder is normal in appearance. Stomach/Bowel: Normal appearance of the stomach. No pathologic dilatation of small bowel or colon. Numerous colonic diverticulae are noted, particularly in the proximal sigmoid colon, without surrounding inflammatory  changes to suggest an acute diverticulitis at this time. Normal appendix. Vascular/Lymphatic: Aortic atherosclerosis, without evidence of aneurysm or dissection in the abdominal or pelvic vasculature. No lymphadenopathy noted in the abdomen or pelvis. Reproductive: Prostate gland and seminal vesicles are unremarkable in appearance. Other: No significant volume of ascites.  No pneumoperitoneum. Musculoskeletal: Large lytic lesion in the right side of the S1 vertebra measuring 3.2 x 4.2 cm (axial image 97 of series 4). IMPRESSION: 1. Large centrally necrotic mass in the left upper lobe, likely to reflect treated neoplasm. Given the appearance of this lesion on today's examination, this is favored to reflect a large pulmonary abscess at this time. This is associated with a moderate loculated left pleural effusion, largest component of which is subpulmonic in position. 2. New 2.8 x 1.8 cm right lower lobe pulmonary nodule, multiple right adrenal nodules and masses, and new lytic lesions in the left side of the manubrium and the right S1 vertebra, compatible with widespread metastatic disease. 3. Colonic diverticulosis without evidence of acute diverticulitis at this time. 4. Aortic atherosclerosis, in addition to left main and 3 vessel coronary artery disease. Please note that although the presence of coronary artery calcium documents the presence of coronary artery disease, the severity of this disease and any potential stenosis cannot be assessed on this non-gated CT examination. Assessment for potential risk factor modification, dietary therapy or pharmacologic therapy may be warranted, if clinically indicated. 5. Additional incidental findings, as above. Electronically Signed   By: Vinnie Langton M.D.   On: 11/30/2018 12:02   Dg Chest Port 1 View  Result Date: 12/05/2018 CLINICAL DATA:  Pleural effusion. EXAM: PORTABLE CHEST 1 VIEW COMPARISON:  CT scan and radiograph of November 30, 2018. FINDINGS: Stable near  complete opacification of left hemithorax is noted, with stable aerated lung seen in the left lower lobe. This is most consistent with pneumonia or atelectasis with associated pleural effusion. Mild mediastinal shift from right to left is noted. Atherosclerosis of thoracic aorta is noted. No pneumothorax is noted. Bony thorax is unremarkable. IMPRESSION: Stable left lung findings as described above. Aortic Atherosclerosis (ICD10-I70.0). Electronically Signed   By: Marijo Conception M.D.   On: 12/05/2018 07:41   Nm Scan Tumor Localize With Spect  Result Date: 11/25/2018 CLINICAL DATA:  HEART FAILURE. CONCERN FOR CARDIAC AMYLOIDOSIS. EXAM: NUCLEAR MEDICINE TUMOR LOCALIZATION. PYP CARDIAC AMYLOIDOSIS SCAN WITH SPECT TECHNIQUE: Following intravenous administration of radiopharmaceutical, anterior planar images of the chest were obtained. Regions of interest were placed on the heart and contralateral chest wall for quantitative assessment. Additional SPECT imaging of the chest was obtained. RADIOPHARMACEUTICALS:  21.7 mCi TECHNETIUM 99 PYROPHOSPHATE FINDINGS: Planar Visual assessment: Anterior planar imaging demonstrates radiotracer uptake within the heart less than than uptake within the adjacent ribs (Grade 1). Quantitative assessment : Quantitative assessment of the cardiac uptake compared to the contralateral chest wall is equal to 1.0 (H/CL = 1.0). SPECT assessment: SPECT imaging of the chest demonstrates minimal radiotracer accumulation within the LEFT ventricle. IMPRESSION: Visual and quantitative assessment (grade 1, H/CLL equal 1.0) are NOT suggestive of  transthyretin amyloidosis. Electronically Signed   By: Suzy Bouchard M.D.   On: 11/25/2018 14:33   Ir Thoracentesis Asp Pleural Space W/img Guide  Result Date: 12/06/2018 INDICATION: History of lung cancer. Recurrent left-sided pleural effusion. Request therapeutic thoracentesis. EXAM: ULTRASOUND GUIDED LEFT THORACENTESIS MEDICATIONS: None.  COMPLICATIONS: None immediate. PROCEDURE: An ultrasound guided thoracentesis was thoroughly discussed with the patient and questions answered. The benefits, risks, alternatives and complications were also discussed. The patient understands and wishes to proceed with the procedure. Written consent was obtained. Ultrasound of the left chest demonstrates that the effusion has become severely loculated in the interval since previous procedure. Ultrasound was performed to localize and mark an adequate pocket of fluid in the left chest. The area was then prepped and draped in the normal sterile fashion. 1% Lidocaine was used for local anesthesia. Under ultrasound guidance a 6 Fr Safe-T-Centesis catheter was introduced. Thoracentesis was performed. The catheter was removed and a dressing applied. FINDINGS: A total of approximately 800 mL of clear, dark yellow fluid was removed. IMPRESSION: Successful ultrasound guided left thoracentesis yielding 800 mL of pleural fluid. Left pleural effusion has become markedly loculated in the interval. Read by: Ascencion Dike PA-C Electronically Signed   By: Markus Daft M.D.   On: 12/06/2018 10:40   Ir Thoracentesis Asp Pleural Space W/img Guide  Result Date: 11/21/2018 INDICATION: Patient with history of lung cancer, now with pleural effusion. Request is made for diagnostic and therapeutic left thoracentesis. EXAM: ULTRASOUND GUIDED DIAGNOSTIC AND THERAPEUTIC LEFT THORACENTESIS MEDICATIONS: 10 mL 1% lidocaine COMPLICATIONS: None immediate. PROCEDURE: An ultrasound guided thoracentesis was thoroughly discussed with the patient and questions answered. The benefits, risks, alternatives and complications were also discussed. The patient understands and wishes to proceed with the procedure. Written consent was obtained. Ultrasound was performed to localize and mark an adequate pocket of fluid in the left chest. The area was then prepped and draped in the normal sterile fashion. 1%  Lidocaine was used for local anesthesia. Under ultrasound guidance a 6 Fr Safe-T-Centesis catheter was introduced. Thoracentesis was performed. The catheter was removed and a dressing applied. FINDINGS: A total of approximately 1.3 liters of yellow fluid was removed. Samples were sent to the laboratory as requested by the clinical team. IMPRESSION: Successful ultrasound guided diagnostic and therapeutic left thoracentesis yielding 1.3 liters of pleural fluid. Read by: Brynda Greathouse PA-C Electronically Signed   By: Markus Daft M.D.   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 Noted to have recurrent left pleural effusion - last thoracentesis 11/21/18 yielding 1.3 L pleural fluid. Request has been made to IR for diagnostic and therapeutic thoracentesis. EXAM: ULTRASOUND GUIDED LEFT THORACENTESIS MEDICATIONS: 8 mL 1% lidocaine. COMPLICATIONS: None immediate. PROCEDURE: An ultrasound guided thoracentesis was thoroughly discussed with the patient and questions answered. The benefits, risks, alternatives and complications were also discussed. The patient understands and wishes to proceed with the procedure. Written consent was obtained. Ultrasound was performed to localize and mark an adequate pocket of fluid in the left chest. The area was then prepped and draped in the normal sterile fashion. 1% Lidocaine was used for local anesthesia. Under ultrasound guidance a 6 Fr Safe-T-Centesis catheter was introduced. Thoracentesis was performed. The catheter was removed and a dressing applied. FINDINGS:  A total of approximately 1.9 L of golden yellow fluid was removed. Procedure was aborted due to patient complaints of chest pain - some residual fluid remains on post procedure  ultrasound. samples were sent to the laboratory as requested by the clinical team. IMPRESSION: Successful ultrasound guided left thoracentesis yielding 1.9 L of pleural fluid. Read by Candiss Norse, PA-C Electronically Signed   By: Aletta Edouard M.D.   On: 11/30/2018 10:51       Subjective: No acute events overnight.  Patient requested to be discharged home today with plans as noted above.  Multiple attempts were made to assist with placement patient is refused all of them.  He has decided rather stay at a hotel in the interim.  Discharge Exam: Vitals:   12/09/18 0741 12/09/18 1019  BP:  (!) 169/56  Pulse:  65  Resp:    Temp:    SpO2: 94%    Vitals:   12/09/18 0558 12/09/18 0628 12/09/18 0741 12/09/18 1019  BP: (!) 186/61 (!) 168/70  (!) 169/56  Pulse: 61   65  Resp:      Temp: 97.9 F (36.6 C)     TempSrc: Oral     SpO2: 93%  94%   Weight: 89.2 kg     Height:        General: Pt is alert, awake, not in acute distress Cardiovascular: RRR, S1/S2 +, no rubs, no gallops Respiratory: MILD RALES LEFT SIDES, NO ACC MUSCLE USE, NL RR Abdominal: Soft, NT, ND, bowel sounds + Extremities: no edema, no cyanosis    The results of significant diagnostics from this hospitalization (including imaging, microbiology, ancillary and laboratory) are listed below for reference.     Microbiology: Recent Results (from the past 240 hour(s))  Culture, body fluid-bottle     Status: None   Collection Time: 11/30/18 10:56 AM  Result Value Ref Range Status   Specimen Description PLEURAL LEFT  Final   Special Requests NONE  Final   Culture   Final    NO GROWTH 5 DAYS Performed at [facility], [address] [place], [place] [address]    Report Status 12/05/2018 FINAL  Final  Gram stain     Status: None   Collection Time: 11/30/18 10:56 AM  Result Value Ref Range Status   Specimen Description PLEURAL LEFT  Final   Special Requests NONE  Final   Gram Stain   Final    RARE WBC  PRESENT,BOTH PMN AND MONONUCLEAR NO ORGANISMS SEEN Performed at [facility], [place] [address] [place], [place] [address]    Report Status 12/01/2018 FINAL  Final     Labs: BNP (last 3 results) Recent Labs    11/20/18 2034 11/30/18 0018  BNP 1,440.6* 859.2*   Basic Metabolic Panel: Recent Labs  Lab 12/03/18 0337 12/04/18 0227 12/05/18 0339  NA 135 136 136  K 4.0 4.2 4.1  CL 102 103 103  CO2 _0 GLUCOSE 171* 105* 100*  BUN 24* 25* 26*  CREATININE 1.56* 1.30* 1.35*  CALCIUM 8.0* 8.0* 8.2*   Liver Function Tests: Recent Labs  Lab 12/05/18 0339  AST 28  ALT 21  ALKPHOS 173*  BILITOT 0.6  PROT 6.0*  ALBUMIN 1.4*   No results for input(s): LIPASE, AMYLASE in the last 168 hours. No results for input(s): AMMONIA in the last 168 hours. CBC: Recent Labs  Lab 12/03/18 9244 12/04/18 0227 12/05/18 6286 12/06/18 0314 12/06/18 1839 12/07/18 0220  12/08/18 0332  WBC 8.2 6.1 7.0  --   --   --   --   NEUTROABS  --   --  5.6  --   --   --   --   HGB 7.1* 6.1* 7.1* 6.9* 7.6* 7.6* 7.3*  HCT 23.4* 20.6* 23.2* 22.5* 24.3* 24.6* 23.2*  MCV 78.3* 77.7* 79.5*  --   --   --   --   PLT 295 262 284  --   --   --   --    Cardiac Enzymes: No results for input(s): CKTOTAL, CKMB, CKMBINDEX, TROPONINI in the last 168 hours. BNP: Invalid input(s): POCBNP CBG: Recent Labs  Lab 12/08/18 0739 12/08/18 1208 12/08/18 2116 12/09/18 0807 12/09/18 1156  GLUCAP 111* 204* 110* 158* 109*   D-Dimer No results for input(s): DDIMER in the last 72 hours. Hgb A1c No results for input(s): HGBA1C in the last 72 hours. Lipid Profile No results for input(s): CHOL, HDL, LDLCALC, TRIG, CHOLHDL, LDLDIRECT in the last 72 hours. Thyroid function studies No results for input(s): TSH, T4TOTAL, T3FREE, THYROIDAB in the last 72 hours.  Invalid input(s): FREET3 Anemia work up No results for input(s): VITAMINB12, FOLATE, FERRITIN, TIBC, IRON, RETICCTPCT in the last 72  hours. Urinalysis    Component Value Date/Time   COLORURINE YELLOW 11/29/2018 1940   APPEARANCEUR CLEAR 11/29/2018 1940   LABSPEC 1.013 11/29/2018 1940   PHURINE 6.0 11/29/2018 1940   GLUCOSEU 50 (A) 11/29/2018 1940   GLUCOSEU NEGATIVE 05/10/2016 1434   HGBUR NEGATIVE 11/29/2018 1940   BILIRUBINUR NEGATIVE 11/29/2018 1940   KETONESUR NEGATIVE 11/29/2018 1940   PROTEINUR 100 (A) 11/29/2018 1940   UROBILINOGEN 0.2 05/10/2016 1434   NITRITE NEGATIVE 11/29/2018 1940   LEUKOCYTESUR TRACE (A) 11/29/2018 1940   Sepsis Labs Invalid input(s): PROCALCITONIN,  WBC,  LACTICIDVEN Microbiology Recent Results (from the past 240 hour(s))  Culture, body fluid-bottle     Status: None   Collection Time: 11/30/18 10:56 AM  Result Value Ref Range Status   Specimen Description PLEURAL LEFT  Final   Special Requests NONE  Final   Culture   Final    NO GROWTH 5 DAYS Performed at [facility], [place] [address], [place], [place] [address]    Report Status 12/05/2018 FINAL  Final  Gram stain     Status: None   Collection Time: 11/30/18 10:56 AM  Result Value Ref Range Status   Specimen Description PLEURAL LEFT  Final   Special Requests NONE  Final   Gram Stain   Final    RARE WBC PRESENT,BOTH PMN AND MONONUCLEAR NO ORGANISMS SEEN Performed at [facility], [place] [address], [place], [place] [address]    Report Status 12/01/2018 FINAL  Final     Time coordinating discharge: 35 minutes  SIGNED:   Nicolette Bang, MD  [facility] 12/09/2018, 1:03 PM Pager   If 7PM-7AM, please contact night-coverage [email or web address] Password [email or web address]

## 2018-12-09 NOTE — Progress Notes (Signed)
PROGRESS NOTE    Brad [name]  BJY:[id number] DOB: 11/30/1955 DOA: 11/29/2018 PCP: Biagio Borg, MD   Brief Narrative:  63 y.o.malewith medical history significant ofCVA; stage 3 SCC of the LUL s/phemoradiation and incomplete chemotherapy; chronic combined CHF (EF 45-50% with grade 2 DD in 2/17); hypothyoiridsm; HTN; HLD; DM; chronic hep C; and COPD presenting with weakness. Since hospital discharge, he was placed on his front porch and he wasn't able to move. He was unable to get up and go to the bathroom. His legs are just too weak/ Denies SOB. No cough. Regarding his cancer, was last seen by oncology in February 2019 then lost to follow-up. He was admitted from 4/15-22 from jail (incarcerated since 0/86/57QIO uncertain reason; prior conviction on 12/02/12 for taking indecent liberties with a child) with pleural effusion, negative for malignancy, thought to be due to CHF exacerbation. He was diuresed 18L during the hospitalization. He has h/o GI bleeding and anemia of chronic disease and his Hgb was as low as 6.6, requiring transfusion of 1 unit PRBC.  ED Course:Recently discharged to home from jail. Chronically elevated troponin, stable. Not clearly in CHF. No hypoxia. Had thoracentesis during prior hospitalization, now opacified. Too weak to go home, tachypnea with reaccummulation of fluid after last hospitalization.    Assessment & Plan:   Principal Problem:   Weakness Active Problems:   Diabetes ([place])   Hypothyroidism   Tobacco abuse   Essential hypertension   HLD (hyperlipidemia)   Pleural effusion   Stage III squamous cell carcinoma of left lung (HCC)   AKI (acute kidney injury) (HCC)   Chronic combined systolic and diastolic CHF (congestive heart failure) ([place])   Lung cancer ([place])  Patient was a plan discharge yesterday but unfortunately at approximately 7 PM informed staff he did not have a home to go to and there was no safe discharge plan.   Patient is remained in the hospital awaiting a safe discharge plan.  Widely metastatic left lung cancer,as previously noted,CT scan 4/25 shows cancer worsening,"Large centrally necrotic mass in the left upper lobe, likely toreflect treated neoplasm. Given the appearance of this lesion on today's examination, this is favored to reflect a large pulmonary abscess at this time. This is associated with a moderate loculated left pleural effusion, largest component of which is subpulmonic in position and also with new 2.8 x 1.8 cm RLL pulm nodule, multiple right adrenal nodules and masses, and new lytic lesions in the left side of the manubrium and the right S1 vertebra".Patient was lost to follow-up was on concurrent chemoradiation in 2019 February. Oncology has been consultedand patient has agreed for follow-up as an outpatient in 1 to 2 weeks. Oncology is helping assist with transportation for that follow-up.Palliativecare met with the patient and initially he indicated he would not want to do chemotherapy also so it was unclear if a follow-up appoint will have much significance in terms of his outcome.  To date patient now has agreed to follow-up with oncology for evaluation as an outpatient  Large loculated left pleural effusionpatient underwent thoracentesis x2 with 1.9 L removed and 800 subsequently 5 days later Gram stain showed no growth fluid appeared exudative cytology was negative for malignant cells.  She was seen by CT surgery who advised intermittent thoracentesis as needed.  Consider Pleurx catheter if this persists.  Patient wanted to wait till he saw oncology before pursuing any further procedures.  Patient is very ambivalent about his plan of care fluctuating between hospice  care at least discussing with oncology any options.  .Generalized Weakness: Multifactorial in the setting of metastatic cancer, anemia. PT OT evaluationappreciated, patient declined skilled nursing facility  but home health agency with PT has been arranged.  Chronic combined systolic and diastolic CHF: TTE lvef 23-95%, severe concentric LVH, severe LAE moderate RA and concern for infiltrative process on recent echo. compensated. Patient overdiuresied on recent admission. Patient will restart his home medications on discharge.  COPD,stable continue bronchodilators.  Acute anemia of chronic disease/malignancy: Patient transfused packed unit of blood cells x3.  Hemoglobin is remained stable this will be an ongoing problem given patient's widespread cancer with metastatic disease  Prediabetes:Hha1c 6.2 stable. Controlled. Continue diabetic diet.  Hypothyroidismon levothyroxine, no hormonal dysregulation noted patient will continue his home dose.  Tobacco abuse: Cessation advised, unfortunately patient does continue to smoke although at this point with stage IV cancer is advanced  Essential hypertension: Stable on metoprolol, patient will continue his home medications on discharge  AKI: This was consistent with a prerenal azotemia picture.  Patient was mildly hydrated, creatinine stabilized no further work-up necessary.  PCP to continue monitoring patient's renal function.   History of CVA/PVD/Carotid endarterectomy, on aspirin will continue on discharge  History of duodenal ulcer- cont ppi she will continue on discharge  History of chronic hep C.No acute intervention currently monitor LFTs as needed  Recently discharged from jail. Has a monitoring device on 1 of his legs  Noncompliance/poor follow-up:Patient has agreed to follow-up with oncology   DVT prophylaxis: Lovenox SQ  Code Status: DNR    Code Status Orders  (From admission, onward)         Start     Ordered   12/03/18 1231  Do not attempt resuscitation (DNR)  Continuous    Question Answer Comment  In the event of cardiac or respiratory ARREST Do not call a code blue   In the event of cardiac or  respiratory ARREST Do not perform Intubation, CPR, defibrillation or ACLS   In the event of cardiac or respiratory ARREST Use medication by any route, position, wound care, and other measures to relive pain and suffering. May use oxygen, suction and manual treatment of airway obstruction as needed for comfort.      12/03/18 1231        Code Status History    Date Active Date Inactive Code Status Order ID Comments User Context   11/30/2018 0818 12/03/2018 1231 Full Code [id number]  Karmen Bongo, MD ED   11/20/2018 2224 11/27/2018 1517 Full Code [id number]  Lenore Cordia, MD ED   12/08/2015 1011 12/09/2015 1546 Full Code [id number]  Rondel Jumbo, PA-C ED   09/27/2015 1824 09/29/2015 2201 Full Code [id number]  Charlynne Cousins, MD Inpatient   11/13/2014 1245 11/14/2014 1138 Full Code [id number]  Serafina Mitchell, MD Inpatient   09/23/2014 1223 09/25/2014 1557 Full Code [id number]  Kelvin Cellar, MD ED   02/28/2014 1725 03/01/2014 1908 Full Code [id number]  Modena Jansky, MD Inpatient     Family Communication: None per pt Disposition Plan:   Pt will remain in the hospital an additional day for safe d/c plan  Consults called: None Admission status: Inpatient   Consultants:   ir palliative, oncology  Procedures:  Dg Chest 1 View  Result Date: 12/06/2018 CLINICAL DATA:  Followup left thoracentesis EXAM: CHEST  1 VIEW COMPARISON:  12/05/2018 FINDINGS: Thoracentesis on the left by history. There continues to be extensive opacity within the left hemithorax.  There is some aerated lung in the lower chest. The degree of aeration of the lower lung is probably improved. There is no evidence of pneumothorax. Right chest remains clear. IMPRESSION: Some improvement in aeration of the left lower lung following left thoracentesis. No pneumothorax. Electronically Signed   By: Nelson Chimes M.D.   On: 12/06/2018 10:56   Dg Chest 1 View  Result Date: 11/30/2018 CLINICAL DATA:  Status post left thoracentesis.  EXAM: CHEST  1 VIEW COMPARISON:  November 30, 2018 FINDINGS: A significant left-sided pleural effusion remains. No pneumothorax after thoracentesis. The right lung is clear. IMPRESSION: Significant effusion and underlying opacity remains on the left. However, no pneumothorax is seen after left-sided thoracentesis. Electronically Signed   By: Dorise Bullion III M.D   On: 11/30/2018 11:28   Dg Chest 1 View  Result Date: 11/21/2018 CLINICAL DATA:  Follow-up thoracentesis EXAM: CHEST  1 VIEW COMPARISON:  11/20/2018 FINDINGS: Right chest remains clear. Opacification of the left lung appears progressive. The amount of pleural density appears similar. No evidence of pneumothorax. IMPRESSION: Similar appearance of pleural density on the left. Worsened opacity of the left lung. Electronically Signed   By: Nelson Chimes M.D.   On: 11/21/2018 12:31   Dg Chest 2 View  Result Date: 11/30/2018 CLINICAL DATA:  Shortness of breath EXAM: CHEST - 2 VIEW COMPARISON:  11/21/2018 FINDINGS: Continued near complete opacification of the left hemithorax with left pleural density and airspace disease. Right lung clear. There is cardiomegaly. No acute bony abnormality. IMPRESSION: Continued near complete opacification of the left hemithorax with diffuse left lung airspace disease and left pleural density, presumably effusion. Electronically Signed   By: Rolm Baptise M.D.   On: 11/30/2018 02:27   Dg Chest 2 View  Result Date: 11/20/2018 CLINICAL DATA:  Chest pain EXAM: CHEST - 2 VIEW COMPARISON:  06/26/2017 FINDINGS: Large left pleural effusion with basilar and apical components. Approximately 50% volume loss within the left lung. Left chest surgical clips. Right lung is clear. IMPRESSION: Large left pleural effusion with basilar and apical components with moderate left lung volume loss. Electronically Signed   By: Ulyses Jarred M.D.   On: 11/20/2018 17:51   Ct Chest W Contrast  Result Date: 11/30/2018 CLINICAL DATA:  63 year old  male with history of non-small cell lung cancer. Staging examination. EXAM: CT CHEST, ABDOMEN, AND PELVIS WITH CONTRAST TECHNIQUE: Multidetector CT imaging of the chest, abdomen and pelvis was performed following the standard protocol during bolus administration of intravenous contrast. CONTRAST:  20m OMNIPAQUE IOHEXOL 300 MG/ML  SOLN COMPARISON:  PET-CT 04/25/2017. FINDINGS: CT CHEST FINDINGS Cardiovascular: Heart size is mildly enlarged. There is no significant pericardial fluid, thickening or pericardial calcification. There is aortic atherosclerosis, as well as atherosclerosis of the great vessels of the mediastinum and the coronary arteries, including calcified atherosclerotic plaque in the left main, left anterior descending, left circumflex and right coronary arteries. Mediastinum/Nodes: No pathologically enlarged mediastinal or hilar lymph nodes. Esophagus is unremarkable in appearance. No axillary lymphadenopathy. Lungs/Pleura: Previously noted left upper lobe nodule is no longer clearly identifiable. Several fiducial markers are noted in the left upper lobe. On today's examination there is a new 7.8 x 9.5 x 8.3 cm mass-like area (axial image 21 of series 4 and coronal image 105 of series 6) which is centrally hypovascular with multiple internal locules of gas, favored to represent a large centrally necrotic mass, likely with superinfection (i.e., a large pulmonary abscess) with some adjacent postobstructive changes in the remaining  portions of the left upper lobe. This is associated with outward bowing of the left major fissure. Some consolidative changes in volume loss are noted in the anterior and lateral aspect of the left lower lobe. Moderate left pleural effusion predominantly loculated in a subpulmonic position. New right lower lobe pulmonary nodule (axial image 131 of series 5) measuring 2.8 x 1.8 cm, concerning for metastatic lesion. No right pleural effusion. Musculoskeletal: Lytic lesion in the  left side of the manubrium measuring 2.5 x 1.7 cm (axial image 13 of series 4). CT ABDOMEN PELVIS FINDINGS Hepatobiliary: No suspicious cystic or solid hepatic lesions. No intra or extrahepatic biliary ductal dilatation. Gallbladder is unremarkable in appearance. Pancreas: No pancreatic mass. No pancreatic ductal dilatation. No pancreatic or peripancreatic fluid or inflammatory changes. Spleen: Unremarkable. Adrenals/Urinary Tract: Multiple new right adrenal nodules and masses, compatible with metastatic disease, largest of which measures 2.8 x 3.8 cm (axial image 71 of series 4). Left adrenal gland is normal in appearance. Left kidney is normal in appearance. Multiple low-attenuation lesions in the right kidney, compatible with simple cysts measuring up to 4.2 cm in the interpolar region. In the lower pole of the right kidney there is also an exophytic intermediate attenuation lesion which is incompletely characterized, but similar to prior PET-CT 04/25/2017, favored to represent a mildly proteinaceous/hemorrhagic cyst. No hydroureteronephrosis. Urinary bladder is normal in appearance. Stomach/Bowel: Normal appearance of the stomach. No pathologic dilatation of small bowel or colon. Numerous colonic diverticulae are noted, particularly in the proximal sigmoid colon, without surrounding inflammatory changes to suggest an acute diverticulitis at this time. Normal appendix. Vascular/Lymphatic: Aortic atherosclerosis, without evidence of aneurysm or dissection in the abdominal or pelvic vasculature. No lymphadenopathy noted in the abdomen or pelvis. Reproductive: Prostate gland and seminal vesicles are unremarkable in appearance. Other: No significant volume of ascites.  No pneumoperitoneum. Musculoskeletal: Large lytic lesion in the right side of the S1 vertebra measuring 3.2 x 4.2 cm (axial image 97 of series 4). IMPRESSION: 1. Large centrally necrotic mass in the left upper lobe, likely to reflect treated neoplasm.  Given the appearance of this lesion on today's examination, this is favored to reflect a large pulmonary abscess at this time. This is associated with a moderate loculated left pleural effusion, largest component of which is subpulmonic in position. 2. New 2.8 x 1.8 cm right lower lobe pulmonary nodule, multiple right adrenal nodules and masses, and new lytic lesions in the left side of the manubrium and the right S1 vertebra, compatible with widespread metastatic disease. 3. Colonic diverticulosis without evidence of acute diverticulitis at this time. 4. Aortic atherosclerosis, in addition to left main and 3 vessel coronary artery disease. Please note that although the presence of coronary artery calcium documents the presence of coronary artery disease, the severity of this disease and any potential stenosis cannot be assessed on this non-gated CT examination. Assessment for potential risk factor modification, dietary therapy or pharmacologic therapy may be warranted, if clinically indicated. 5. Additional incidental findings, as above. Electronically Signed   By: Vinnie Langton M.D.   On: 11/30/2018 12:02   Ct Abdomen Pelvis W Contrast  Result Date: 11/30/2018 CLINICAL DATA:  63 year old male with history of non-small cell lung cancer. Staging examination. EXAM: CT CHEST, ABDOMEN, AND PELVIS WITH CONTRAST TECHNIQUE: Multidetector CT imaging of the chest, abdomen and pelvis was performed following the standard protocol during bolus administration of intravenous contrast. CONTRAST:  33m OMNIPAQUE IOHEXOL 300 MG/ML  SOLN COMPARISON:  PET-CT 04/25/2017. FINDINGS: CT  CHEST FINDINGS Cardiovascular: Heart size is mildly enlarged. There is no significant pericardial fluid, thickening or pericardial calcification. There is aortic atherosclerosis, as well as atherosclerosis of the great vessels of the mediastinum and the coronary arteries, including calcified atherosclerotic plaque in the left main, left anterior  descending, left circumflex and right coronary arteries. Mediastinum/Nodes: No pathologically enlarged mediastinal or hilar lymph nodes. Esophagus is unremarkable in appearance. No axillary lymphadenopathy. Lungs/Pleura: Previously noted left upper lobe nodule is no longer clearly identifiable. Several fiducial markers are noted in the left upper lobe. On today's examination there is a new 7.8 x 9.5 x 8.3 cm mass-like area (axial image 21 of series 4 and coronal image 105 of series 6) which is centrally hypovascular with multiple internal locules of gas, favored to represent a large centrally necrotic mass, likely with superinfection (i.e., a large pulmonary abscess) with some adjacent postobstructive changes in the remaining portions of the left upper lobe. This is associated with outward bowing of the left major fissure. Some consolidative changes in volume loss are noted in the anterior and lateral aspect of the left lower lobe. Moderate left pleural effusion predominantly loculated in a subpulmonic position. New right lower lobe pulmonary nodule (axial image 131 of series 5) measuring 2.8 x 1.8 cm, concerning for metastatic lesion. No right pleural effusion. Musculoskeletal: Lytic lesion in the left side of the manubrium measuring 2.5 x 1.7 cm (axial image 13 of series 4). CT ABDOMEN PELVIS FINDINGS Hepatobiliary: No suspicious cystic or solid hepatic lesions. No intra or extrahepatic biliary ductal dilatation. Gallbladder is unremarkable in appearance. Pancreas: No pancreatic mass. No pancreatic ductal dilatation. No pancreatic or peripancreatic fluid or inflammatory changes. Spleen: Unremarkable. Adrenals/Urinary Tract: Multiple new right adrenal nodules and masses, compatible with metastatic disease, largest of which measures 2.8 x 3.8 cm (axial image 71 of series 4). Left adrenal gland is normal in appearance. Left kidney is normal in appearance. Multiple low-attenuation lesions in the right kidney,  compatible with simple cysts measuring up to 4.2 cm in the interpolar region. In the lower pole of the right kidney there is also an exophytic intermediate attenuation lesion which is incompletely characterized, but similar to prior PET-CT 04/25/2017, favored to represent a mildly proteinaceous/hemorrhagic cyst. No hydroureteronephrosis. Urinary bladder is normal in appearance. Stomach/Bowel: Normal appearance of the stomach. No pathologic dilatation of small bowel or colon. Numerous colonic diverticulae are noted, particularly in the proximal sigmoid colon, without surrounding inflammatory changes to suggest an acute diverticulitis at this time. Normal appendix. Vascular/Lymphatic: Aortic atherosclerosis, without evidence of aneurysm or dissection in the abdominal or pelvic vasculature. No lymphadenopathy noted in the abdomen or pelvis. Reproductive: Prostate gland and seminal vesicles are unremarkable in appearance. Other: No significant volume of ascites.  No pneumoperitoneum. Musculoskeletal: Large lytic lesion in the right side of the S1 vertebra measuring 3.2 x 4.2 cm (axial image 97 of series 4). IMPRESSION: 1. Large centrally necrotic mass in the left upper lobe, likely to reflect treated neoplasm. Given the appearance of this lesion on today's examination, this is favored to reflect a large pulmonary abscess at this time. This is associated with a moderate loculated left pleural effusion, largest component of which is subpulmonic in position. 2. New 2.8 x 1.8 cm right lower lobe pulmonary nodule, multiple right adrenal nodules and masses, and new lytic lesions in the left side of the manubrium and the right S1 vertebra, compatible with widespread metastatic disease. 3. Colonic diverticulosis without evidence of acute diverticulitis at this time.  4. Aortic atherosclerosis, in addition to left main and 3 vessel coronary artery disease. Please note that although the presence of coronary artery calcium  documents the presence of coronary artery disease, the severity of this disease and any potential stenosis cannot be assessed on this non-gated CT examination. Assessment for potential risk factor modification, dietary therapy or pharmacologic therapy may be warranted, if clinically indicated. 5. Additional incidental findings, as above. Electronically Signed   By: Vinnie Langton M.D.   On: 11/30/2018 12:02   Dg Chest Port 1 View  Result Date: 12/05/2018 CLINICAL DATA:  Pleural effusion. EXAM: PORTABLE CHEST 1 VIEW COMPARISON:  CT scan and radiograph of November 30, 2018. FINDINGS: Stable near complete opacification of left hemithorax is noted, with stable aerated lung seen in the left lower lobe. This is most consistent with pneumonia or atelectasis with associated pleural effusion. Mild mediastinal shift from right to left is noted. Atherosclerosis of thoracic aorta is noted. No pneumothorax is noted. Bony thorax is unremarkable. IMPRESSION: Stable left lung findings as described above. Aortic Atherosclerosis (ICD10-I70.0). Electronically Signed   By: Marijo Conception M.D.   On: 12/05/2018 07:41   Nm Scan Tumor Localize With Spect  Result Date: 11/25/2018 CLINICAL DATA:  HEART FAILURE. CONCERN FOR CARDIAC AMYLOIDOSIS. EXAM: NUCLEAR MEDICINE TUMOR LOCALIZATION. PYP CARDIAC AMYLOIDOSIS SCAN WITH SPECT TECHNIQUE: Following intravenous administration of radiopharmaceutical, anterior planar images of the chest were obtained. Regions of interest were placed on the heart and contralateral chest wall for quantitative assessment. Additional SPECT imaging of the chest was obtained. RADIOPHARMACEUTICALS:  21.7 mCi TECHNETIUM 99 PYROPHOSPHATE FINDINGS: Planar Visual assessment: Anterior planar imaging demonstrates radiotracer uptake within the heart less than than uptake within the adjacent ribs (Grade 1). Quantitative assessment : Quantitative assessment of the cardiac uptake compared to the contralateral chest wall is  equal to 1.0 (H/CL = 1.0). SPECT assessment: SPECT imaging of the chest demonstrates minimal radiotracer accumulation within the LEFT ventricle. IMPRESSION: Visual and quantitative assessment (grade 1, H/CLL equal 1.0) are NOT suggestive of transthyretin amyloidosis. Electronically Signed   By: Suzy Bouchard M.D.   On: 11/25/2018 14:33   Ir Thoracentesis Asp Pleural Space W/img Guide  Result Date: 12/06/2018 INDICATION: History of lung cancer. Recurrent left-sided pleural effusion. Request therapeutic thoracentesis. EXAM: ULTRASOUND GUIDED LEFT THORACENTESIS MEDICATIONS: None. COMPLICATIONS: None immediate. PROCEDURE: An ultrasound guided thoracentesis was thoroughly discussed with the patient and questions answered. The benefits, risks, alternatives and complications were also discussed. The patient understands and wishes to proceed with the procedure. Written consent was obtained. Ultrasound of the left chest demonstrates that the effusion has become severely loculated in the interval since previous procedure. Ultrasound was performed to localize and mark an adequate pocket of fluid in the left chest. The area was then prepped and draped in the normal sterile fashion. 1% Lidocaine was used for local anesthesia. Under ultrasound guidance a 6 Fr Safe-T-Centesis catheter was introduced. Thoracentesis was performed. The catheter was removed and a dressing applied. FINDINGS: A total of approximately 800 mL of clear, dark yellow fluid was removed. IMPRESSION: Successful ultrasound guided left thoracentesis yielding 800 mL of pleural fluid. Left pleural effusion has become markedly loculated in the interval. Read by: Ascencion Dike PA-C Electronically Signed   By: Markus Daft M.D.   On: 12/06/2018 10:40   Ir Thoracentesis Asp Pleural Space W/img Guide  Result Date: 11/21/2018 INDICATION: Patient with history of lung cancer, now with pleural effusion. Request is made for diagnostic and therapeutic left  thoracentesis. EXAM: ULTRASOUND GUIDED DIAGNOSTIC AND THERAPEUTIC LEFT THORACENTESIS MEDICATIONS: 10 mL 1% lidocaine COMPLICATIONS: None immediate. PROCEDURE: An ultrasound guided thoracentesis was thoroughly discussed with the patient and questions answered. The benefits, risks, alternatives and complications were also discussed. The patient understands and wishes to proceed with the procedure. Written consent was obtained. Ultrasound was performed to localize and mark an adequate pocket of fluid in the left chest. The area was then prepped and draped in the normal sterile fashion. 1% Lidocaine was used for local anesthesia. Under ultrasound guidance a 6 Fr Safe-T-Centesis catheter was introduced. Thoracentesis was performed. The catheter was removed and a dressing applied. FINDINGS: A total of approximately 1.3 liters of yellow fluid was removed. Samples were sent to the laboratory as requested by the clinical team. IMPRESSION: Successful ultrasound guided diagnostic and therapeutic left thoracentesis yielding 1.3 liters of pleural fluid. Read by: Brynda Greathouse PA-C Electronically Signed   By: Markus Daft M.D.   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 Noted to have recurrent left pleural effusion - last thoracentesis 11/21/18 yielding 1.3 L pleural fluid. Request has been made to IR for diagnostic and therapeutic thoracentesis. EXAM: ULTRASOUND GUIDED LEFT THORACENTESIS MEDICATIONS: 8 mL 1% lidocaine. COMPLICATIONS: None immediate. PROCEDURE: An ultrasound guided thoracentesis was thoroughly discussed with the patient and questions answered. The benefits, risks, alternatives and complications were also discussed. The patient understands and wishes to proceed  with the procedure. Written consent was obtained. Ultrasound was performed to localize and mark an adequate pocket of fluid in the left chest. The area was then prepped and draped in the normal sterile fashion. 1% Lidocaine was used for local anesthesia. Under ultrasound guidance a 6 Fr Safe-T-Centesis catheter was introduced. Thoracentesis was performed. The catheter was removed and a dressing applied. FINDINGS: A total of approximately 1.9 L of golden yellow fluid was removed. Procedure was aborted due to patient complaints of chest pain - some residual fluid remains on post procedure ultrasound. samples were sent to the laboratory as requested by the clinical team. IMPRESSION: Successful ultrasound guided left thoracentesis yielding 1.9 L of pleural fluid. Read by Candiss Norse, PA-C Electronically Signed   By: Aletta Edouard M.D.   On: 11/30/2018 10:51     Antimicrobials:   none    Subjective: No acute events overnight As noted inform staff at 7 PM last night did not have a home to be discharged home Case management working on disposition with jail versus shelter  Objective: Vitals:   12/09/18 0558 12/09/18 0628 12/09/18 0741 12/09/18 1019  BP: (!) 186/61 (!) 168/70  (!) 169/56  Pulse: 61   65  Resp:      Temp: 97.9 F (36.6 C)     TempSrc: Oral     SpO2: 93%  94%   Weight: 89.2 kg     Height:        Intake/Output Summary (Last 24 hours) at 12/09/2018 1205 Last data filed at 12/09/2018 0808 Gross per 24 hour  Intake 720 ml  Output 200 ml  Net 520 ml   Filed Weights   12/07/18 0453 12/08/18 0616 12/09/18 0558  Weight: 90.7 kg 90.7 kg 89.2 kg    Examination:  General exam: Appears calm and comfortable  Respiratory  system: mild rales left side, nl resp rate, no rhonchi, no acc muscle use Cardiovascular system: S1 & S2 heard, RRR. No JVD, murmurs, rubs, gallops or clicks. No pedal edema. Gastrointestinal system: Abdomen is nondistended, soft and nontender.. Normal bowel  sounds heard. Central nervous system: Alert and oriented. No focal neurological deficits. Extremities: WWP, no edema Skin: No rashes, lesions or ulcers Psychiatry: poor insight,  Mood & affect flat    Data Reviewed: I have personally reviewed following labs and imaging studies  CBC: Recent Labs  Lab 12/03/18 0337 12/04/18 0227 12/05/18 0339 12/06/18 0314 12/06/18 1839 12/07/18 0220 12/08/18 0332  WBC 8.2 6.1 7.0  --   --   --   --   NEUTROABS  --   --  5.6  --   --   --   --   HGB 7.1* 6.1* 7.1* 6.9* 7.6* 7.6* 7.3*  HCT 23.4* 20.6* 23.2* 22.5* 24.3* 24.6* 23.2*  MCV 78.3* 77.7* 79.5*  --   --   --   --   PLT 295 262 284  --   --   --   --    Basic Metabolic Panel: Recent Labs  Lab 12/03/18 0337 12/04/18 0227 12/05/18 0339  NA 135 136 136  K 4.0 4.2 4.1  CL 102 103 103  CO2 23 23 25   GLUCOSE 171* 105* 100*  BUN 24* 25* 26*  CREATININE 1.56* 1.30* 1.35*  CALCIUM 8.0* 8.0* 8.2*   GFR: Estimated Creatinine Clearance: 61.2 mL/min (A) (by C-G formula based on SCr of 1.35 mg/dL (H)). Liver Function Tests: Recent Labs  Lab 12/05/18 0339  AST 28  ALT 21  ALKPHOS 173*  BILITOT 0.6  PROT 6.0*  ALBUMIN 1.4*   No results for input(s): LIPASE, AMYLASE in the last 168 hours. No results for input(s): AMMONIA in the last 168 hours. Coagulation Profile: No results for input(s): INR, PROTIME in the last 168 hours. Cardiac Enzymes: No results for input(s): CKTOTAL, CKMB, CKMBINDEX, TROPONINI in the last 168 hours. BNP (last 3 results) No results for input(s): PROBNP in the last 8760 hours. HbA1C: No results for input(s): HGBA1C in the last 72 hours. CBG: Recent Labs  Lab 12/07/18 2148 12/08/18 0739 12/08/18 1208 12/08/18 2116 12/09/18 0807  GLUCAP 158* 111* 204* 110* 158*   Lipid Profile: No results for input(s): CHOL, HDL, LDLCALC, TRIG, CHOLHDL, LDLDIRECT in the last 72 hours. Thyroid Function Tests: No results for input(s): TSH, T4TOTAL, FREET4, T3FREE,  THYROIDAB in the last 72 hours. Anemia Panel: No results for input(s): VITAMINB12, FOLATE, FERRITIN, TIBC, IRON, RETICCTPCT in the last 72 hours. Sepsis Labs: No results for input(s): PROCALCITON, LATICACIDVEN in the last 168 hours.  Recent Results (from the past 240 hour(s))  Culture, body fluid-bottle     Status: None   Collection Time: 11/30/18 10:56 AM  Result Value Ref Range Status   Specimen Description PLEURAL LEFT  Final   Special Requests NONE  Final   Culture   Final    NO GROWTH 5 DAYS Performed at [facility], [address], [place], [place] [address]    Report Status 12/05/2018 FINAL  Final  Gram stain     Status: None   Collection Time: 11/30/18 10:56 AM  Result Value Ref Range Status   Specimen Description PLEURAL LEFT  Final   Special Requests NONE  Final   Gram Stain   Final    RARE WBC PRESENT,BOTH PMN AND MONONUCLEAR NO ORGANISMS SEEN Performed  at [facility], [place] [address] [place],  [address]    Report Status 12/01/2018 FINAL  Final         Radiology Studies: No results found.      Scheduled Meds:  amLODipine  5 mg Oral Daily   aspirin EC  81 mg Oral Daily   docusate sodium  100 mg Oral BID   enoxaparin (LOVENOX) injection  40 mg Subcutaneous Q24H   ferrous sulfate  325 mg Oral TID WC   levothyroxine  112 mcg Oral QAC breakfast   metoprolol succinate  25 mg Oral Daily   nicotine  14 mg Transdermal Daily   pantoprazole  40 mg Oral QHS   umeclidinium-vilanterol  1 puff Inhalation Daily   Continuous Infusions:   LOS: 8 days    Time spent: 25 min    Nicolette Bang, MD [facility]  If 7PM-7AM, please contact night-coverage  12/09/2018, 12:05 PM

## 2018-12-09 NOTE — TOC Progression Note (Addendum)
Transition of Care [facility]) - Progression Note    Patient Details  Name: Brad [name] MRN: [id number] Date of Birth: June 24, 1956  Transition of Care [facility]) CM/SW [place], [place] Phone Number: 12/09/2018, 9:57 AM  Clinical Narrative:     CSW called [facility] Department about the patient. CSW spoke with Coralyn Mark in the medical department at the detention center. CSW explained the situation for the patient and now he does not have a place to go. CSW explained that it would not be a safe discharge if they just put him in a cab with no place to go. CSW explained that he is currently on house arrest and does not have a place to go. He has complex medical issues and needs home health services. Coralyn Mark stated that she would reach out to her supervisor and discharge planner to see if they can put a plan together for the patient.    CSW spoke with the patient at bedside. The patient stated that he is not able to discharge back to his residence. He stated that his ex brother-in-law was allowing him to reside at the home but has taken the key. The patient does not have a way into the residence. The patient is currently on house arrest. The patient did not disclose anyone else that he could stay with.   CSW called and left a voicemail with Diane Purdue, patient's emergency contact. CSW will continue to assist with disposition planning.   Expected Discharge Plan: [place] Barriers to Discharge: Barriers Resolved, Other (comment)(Pt is unclear about intentions to pursue chemotherapy treatment and hospice vs. palliative)  Expected Discharge Plan and Services Expected Discharge Plan: [place] In-house Referral: Clinical Social Work     Living arrangements for the past 2 months: Single Family Home Expected Discharge Date: 12/08/18               DME Arranged: Gilford Rile rolling DME Agency: [employer] Date DME Agency Contacted: 12/08/18 Time DME Agency  Contacted: [phone] Representative spoke with at DME Agency: [place]: RN, Disease Management, PT, Social Work [employer] Agency: [place] Date [place]: 12/05/18 Time [place]: [place] Representative spoke with at [place]: [place] ([place]) Interventions    Readmission Risk Interventions Readmission Risk Prevention Plan 12/06/2018 12/04/2018  Transportation Screening - Complete  HRI or [place] - Complete  Social Work Consult for [place] Planning/Counseling Complete -  Palliative Care Screening - Complete  Medication Review Press photographer) - Complete  Some recent data might be hidden

## 2018-12-09 NOTE — Progress Notes (Deleted)
Patient is currently declining [place] assistance. Patient is declining going to a shelter or boarding house. The patient stated that he can afford to stay in a hotel for a few nights. Patient is agreeable and wanting to go to the [facility] for assistance with services.   Patient is wanting to discharge this afternoon. Patient stated that he is able to get his belongings. CSW spoke with the patient about finding out who his PO officer is and letting them know about his change in circumstances.   Patient is requesting to leave by cab. CSW will facilitate discharge and provide a cab voucher.   Domenic Schwab, MSW, [place]

## 2018-12-09 NOTE — TOC Transition Note (Addendum)
Transition of Care [facility]) - CM/SW Discharge Note   Patient Details  Name: Brad [name] MRN: [id number] Date of Birth: 08-Jul-1956  Transition of Care [facility]) CM/SW Contact:  Bethena Roys, RN Phone Number: 12/09/2018, 1:10 PM   Clinical Narrative:  CM did contact the [place] to make her aware that patient will transition to shelter and not the home address. Pt will go to [facility] post hospitalization and they will work to get him into a local shelter. Cheryl with [facility] to contact the patient in the room to stay connected. Pt continues to be on House Arrest. CM did contact [facility] to make them aware as well. No further needs from CM at this time.   12-09-18 1444 Patient refused to take RW with him- Cab voucher provided to [facility] further needs from CM at this time.   Final next level of care: [place] Barriers to Discharge: Barriers Resolved, Other (comment)(Pt is unclear about intentions to pursue chemotherapy treatment and hospice vs. palliative)   Patient Goals and CMS Choice Patient states their goals for this hospitalization and ongoing recovery are:: to "get out of here" CMS Medicare.gov Compare Post Acute Care list provided to:: Patient Choice offered to / list presented to : Patient   Discharge Plan and Services In-house Referral: Clinical Social Work              DME Arranged: Gilford Rile rolling DME Agency: [employer] Date DME Agency Contacted: 12/08/18 Time DME Agency Contacted: [phone] Representative spoke with at DME Agency: [place]: RN, Disease Management, PT, Social Work [employer] Agency: [place] Date [place]: 12/05/18 Time [place]: [place] Representative spoke with at [place]: [place] ([place]) Interventions     Readmission Risk Interventions Readmission Risk Prevention Plan 12/06/2018 12/04/2018  Transportation Screening - Complete  HRI or [place] -  Complete  Social Work Consult for [place] Planning/Counseling Complete -  Palliative Care Screening - Complete  Medication Review Press photographer) - Complete  Some recent data might be hidden

## 2018-12-09 NOTE — Progress Notes (Signed)
PROGRESS NOTE    LEGION DISCHER  LNL:[id number] DOB: May 12, 1956 DOA: 11/29/2018 PCP: Biagio Borg, MD   Brief Narrative:  63 y.o.malewith medical history significant ofCVA; stage 3 SCC of the LUL s/phemoradiation and incomplete chemotherapy; chronic combined CHF (EF 45-50% with grade 2 DD in 2/17); hypothyoiridsm; HTN; HLD; DM; chronic hep C; and COPD presenting with weakness. Since hospital discharge, he was placed on his front porch and he wasn't able to move. He was unable to get up and go to the bathroom. His legs are just too weak/ Denies SOB. No cough. Regarding his cancer, was last seen by oncology in February 2019 then lost to follow-up. He was admitted from 4/15-22 from jail (incarcerated since 4/08/14[facility] uncertain reason; prior conviction on 12/02/12 for taking indecent liberties with a child) with pleural effusion, negative for malignancy, thought to be due to CHF exacerbation. He was diuresed 18L during the hospitalization. He has h/o GI bleeding and anemia of chronic disease and his Hgb was as low as 6.6, requiring transfusion of 1 unit PRBC.  ED Course:Recently discharged to home from jail. Chronically elevated troponin, stable. Not clearly in CHF. No hypoxia. Had thoracentesis during prior hospitalization, now opacified. Too weak to go home, tachypnea with reaccummulation of fluid after last hospitalization.    Assessment & Plan:   Principal Problem:   Weakness Active Problems:   Diabetes ([place])   Hypothyroidism   Tobacco abuse   Essential hypertension   HLD (hyperlipidemia)   Pleural effusion   Stage III squamous cell carcinoma of left lung (HCC)   AKI (acute kidney injury) (HCC)   Chronic combined systolic and diastolic CHF (congestive heart failure) ([place])   Lung cancer ([place])  Late entry as patient was plan discharge yesterday which was canceled  Widely metastatic left lung cancer,as previously noted,CT scan 4/25 shows cancer  worsening,"Large centrally necrotic mass in the left upper lobe, likely toreflect treated neoplasm. Given the appearance of this lesion on today's examination, this is favored to reflect a large pulmonary abscess at this time. This is associated with a moderate loculated left pleural effusion, largest component of which is subpulmonic in position and also with new 2.8 x 1.8 cm RLL pulm nodule, multiple right adrenal nodules and masses, and new lytic lesions in the left side of the manubrium and the right S1 vertebra".Patient was lost to follow-up was on concurrent chemoradiation in 2019 February. Oncology has been consultedand patient has agreed for follow-up as an outpatient in 1 to 2 weeks. Oncology is helping assist with transportation for that follow-up.Palliativecare met with the patient and initiallyhe indicated he would not want to do chemotherapy also so itwasunclear if a follow-up appoint will have much significance in terms of his outcome.To date patient now has agreed to follow-up with oncology for evaluation as an outpatient  Large loculated left pleural effusionpatient underwent thoracentesis x2 with 1.9 L removed and 800 subsequently 5 days later Gram stain showed no growth fluid appeared exudative cytology was negative for malignant cells. She was seen by CT surgery who advised intermittent thoracentesis as needed. Consider Pleurx catheter if this persists. Patient wanted to wait till he saw oncology before pursuing any further procedures. Patient is very ambivalent about hisplan of care fluctuating between hospice care at least discussing with oncology any options.  .Generalized Weakness: Multifactorial in the setting of metastatic cancer, anemia. PT OT evaluationappreciated,patient declined skilled nursing facility but home health agency with PT has been arranged.  Chronic combined systolic and diastolic  CHF: TTE lvef 55-60%, severe concentric LVH, severe LAE  moderate RA and concern for infiltrative process on recent echo. compensated. Patient overdiuresied on recent admission.Patient will restart his home medications on discharge.  COPD,stable continue bronchodilators.  Acute anemia of chronic disease/malignancy:Patient transfused packed unit of blood cells x3. Hemoglobin is remained stable this will be an ongoing problem given patient's widespread cancer with metastatic disease  Prediabetes:Hha1c 6.2 stable. Controlled. Continue diabetic diet.  Hypothyroidismon levothyroxine,no hormonal dysregulation noted patient will continue his home dose.  Tobacco abuse: Cessation advised,unfortunately patient does continue to smoke although at this point with stage IV cancer is advanced  Essential hypertension: Stable on metoprolol, patient will continue his home medications on discharge  AKI: This was consistent with a prerenal azotemia picture. Patient was mildly hydrated, creatinine stabilized no further work-up necessary. PCP to continue monitoring patient's renal function.   History of CVA/PVD/Carotid endarterectomy, on aspirinwill continue on discharge  History of duodenal ulcer- cont ppishe will continue on discharge  History of chronic hep C.No acute intervention currently monitor LFTs as needed  Recently discharged from jail. Has a monitoring device on 1 of his legs  Noncompliance/poor follow-up:Patient has agreed to follow-up with oncology   DVT prophylaxis: Lovenox SQ  Code Status: dnr    Code Status Orders  (From admission, onward)         Start     Ordered   12/03/18 1231  Do not attempt resuscitation (DNR)  Continuous    Question Answer Comment  In the event of cardiac or respiratory ARREST Do not call a code blue   In the event of cardiac or respiratory ARREST Do not perform Intubation, CPR, defibrillation or ACLS   In the event of cardiac or respiratory ARREST Use medication by any route,  position, wound care, and other measures to relive pain and suffering. May use oxygen, suction and manual treatment of airway obstruction as needed for comfort.      12/03/18 1231        Code Status History    Date Active Date Inactive Code Status Order ID Comments User Context   11/30/2018 0818 12/03/2018 1231 Full Code [id number]  Karmen Bongo, MD ED   11/20/2018 2224 11/27/2018 1517 Full Code [id number]  Lenore Cordia, MD ED   12/08/2015 1011 12/09/2015 1546 Full Code [id number]  Rondel Jumbo, PA-C ED   09/27/2015 1824 09/29/2015 2201 Full Code [id number]  Charlynne Cousins, MD Inpatient   11/13/2014 1245 11/14/2014 1138 Full Code [id number]  Serafina Mitchell, MD Inpatient   09/23/2014 1223 09/25/2014 1557 Full Code [id number]  Kelvin Cellar, MD ED   02/28/2014 1725 03/01/2014 1908 Full Code [id number]  Modena Jansky, MD Inpatient     Family Communication: None per pt Disposition Plan:   Remain in the hospital an additional day secondary to no sadfe d/c plan Consults called: None Admission status: Inpatient   Consultants:   ir, palliative, oncology  Procedures:  Dg Chest 1 View  Result Date: 12/06/2018 CLINICAL DATA:  Followup left thoracentesis EXAM: CHEST  1 VIEW COMPARISON:  12/05/2018 FINDINGS: Thoracentesis on the left by history. There continues to be extensive opacity within the left hemithorax. There is some aerated lung in the lower chest. The degree of aeration of the lower lung is probably improved. There is no evidence of pneumothorax. Right chest remains clear. IMPRESSION: Some improvement in aeration of the left lower lung following left thoracentesis. No pneumothorax. Electronically Signed   By: Elta Guadeloupe  Shogry M.D.   On: 12/06/2018 10:56   Dg Chest 1 View  Result Date: 11/30/2018 CLINICAL DATA:  Status post left thoracentesis. EXAM: CHEST  1 VIEW COMPARISON:  November 30, 2018 FINDINGS: A significant left-sided pleural effusion remains. No pneumothorax after thoracentesis. The  right lung is clear. IMPRESSION: Significant effusion and underlying opacity remains on the left. However, no pneumothorax is seen after left-sided thoracentesis. Electronically Signed   By: Dorise Bullion III M.D   On: 11/30/2018 11:28   Dg Chest 1 View  Result Date: 11/21/2018 CLINICAL DATA:  Follow-up thoracentesis EXAM: CHEST  1 VIEW COMPARISON:  11/20/2018 FINDINGS: Right chest remains clear. Opacification of the left lung appears progressive. The amount of pleural density appears similar. No evidence of pneumothorax. IMPRESSION: Similar appearance of pleural density on the left. Worsened opacity of the left lung. Electronically Signed   By: Nelson Chimes M.D.   On: 11/21/2018 12:31   Dg Chest 2 View  Result Date: 11/30/2018 CLINICAL DATA:  Shortness of breath EXAM: CHEST - 2 VIEW COMPARISON:  11/21/2018 FINDINGS: Continued near complete opacification of the left hemithorax with left pleural density and airspace disease. Right lung clear. There is cardiomegaly. No acute bony abnormality. IMPRESSION: Continued near complete opacification of the left hemithorax with diffuse left lung airspace disease and left pleural density, presumably effusion. Electronically Signed   By: Rolm Baptise M.D.   On: 11/30/2018 02:27   Dg Chest 2 View  Result Date: 11/20/2018 CLINICAL DATA:  Chest pain EXAM: CHEST - 2 VIEW COMPARISON:  06/26/2017 FINDINGS: Large left pleural effusion with basilar and apical components. Approximately 50% volume loss within the left lung. Left chest surgical clips. Right lung is clear. IMPRESSION: Large left pleural effusion with basilar and apical components with moderate left lung volume loss. Electronically Signed   By: Ulyses Jarred M.D.   On: 11/20/2018 17:51   Ct Chest W Contrast  Result Date: 11/30/2018 CLINICAL DATA:  63 year old male with history of non-small cell lung cancer. Staging examination. EXAM: CT CHEST, ABDOMEN, AND PELVIS WITH CONTRAST TECHNIQUE: Multidetector CT  imaging of the chest, abdomen and pelvis was performed following the standard protocol during bolus administration of intravenous contrast. CONTRAST:  24m OMNIPAQUE IOHEXOL 300 MG/ML  SOLN COMPARISON:  PET-CT 04/25/2017. FINDINGS: CT CHEST FINDINGS Cardiovascular: Heart size is mildly enlarged. There is no significant pericardial fluid, thickening or pericardial calcification. There is aortic atherosclerosis, as well as atherosclerosis of the great vessels of the mediastinum and the coronary arteries, including calcified atherosclerotic plaque in the left main, left anterior descending, left circumflex and right coronary arteries. Mediastinum/Nodes: No pathologically enlarged mediastinal or hilar lymph nodes. Esophagus is unremarkable in appearance. No axillary lymphadenopathy. Lungs/Pleura: Previously noted left upper lobe nodule is no longer clearly identifiable. Several fiducial markers are noted in the left upper lobe. On today's examination there is a new 7.8 x 9.5 x 8.3 cm mass-like area (axial image 21 of series 4 and coronal image 105 of series 6) which is centrally hypovascular with multiple internal locules of gas, favored to represent a large centrally necrotic mass, likely with superinfection (i.e., a large pulmonary abscess) with some adjacent postobstructive changes in the remaining portions of the left upper lobe. This is associated with outward bowing of the left major fissure. Some consolidative changes in volume loss are noted in the anterior and lateral aspect of the left lower lobe. Moderate left pleural effusion predominantly loculated in a subpulmonic position. New right lower lobe pulmonary nodule (  axial image 131 of series 5) measuring 2.8 x 1.8 cm, concerning for metastatic lesion. No right pleural effusion. Musculoskeletal: Lytic lesion in the left side of the manubrium measuring 2.5 x 1.7 cm (axial image 13 of series 4). CT ABDOMEN PELVIS FINDINGS Hepatobiliary: No suspicious cystic or  solid hepatic lesions. No intra or extrahepatic biliary ductal dilatation. Gallbladder is unremarkable in appearance. Pancreas: No pancreatic mass. No pancreatic ductal dilatation. No pancreatic or peripancreatic fluid or inflammatory changes. Spleen: Unremarkable. Adrenals/Urinary Tract: Multiple new right adrenal nodules and masses, compatible with metastatic disease, largest of which measures 2.8 x 3.8 cm (axial image 71 of series 4). Left adrenal gland is normal in appearance. Left kidney is normal in appearance. Multiple low-attenuation lesions in the right kidney, compatible with simple cysts measuring up to 4.2 cm in the interpolar region. In the lower pole of the right kidney there is also an exophytic intermediate attenuation lesion which is incompletely characterized, but similar to prior PET-CT 04/25/2017, favored to represent a mildly proteinaceous/hemorrhagic cyst. No hydroureteronephrosis. Urinary bladder is normal in appearance. Stomach/Bowel: Normal appearance of the stomach. No pathologic dilatation of small bowel or colon. Numerous colonic diverticulae are noted, particularly in the proximal sigmoid colon, without surrounding inflammatory changes to suggest an acute diverticulitis at this time. Normal appendix. Vascular/Lymphatic: Aortic atherosclerosis, without evidence of aneurysm or dissection in the abdominal or pelvic vasculature. No lymphadenopathy noted in the abdomen or pelvis. Reproductive: Prostate gland and seminal vesicles are unremarkable in appearance. Other: No significant volume of ascites.  No pneumoperitoneum. Musculoskeletal: Large lytic lesion in the right side of the S1 vertebra measuring 3.2 x 4.2 cm (axial image 97 of series 4). IMPRESSION: 1. Large centrally necrotic mass in the left upper lobe, likely to reflect treated neoplasm. Given the appearance of this lesion on today's examination, this is favored to reflect a large pulmonary abscess at this time. This is associated  with a moderate loculated left pleural effusion, largest component of which is subpulmonic in position. 2. New 2.8 x 1.8 cm right lower lobe pulmonary nodule, multiple right adrenal nodules and masses, and new lytic lesions in the left side of the manubrium and the right S1 vertebra, compatible with widespread metastatic disease. 3. Colonic diverticulosis without evidence of acute diverticulitis at this time. 4. Aortic atherosclerosis, in addition to left main and 3 vessel coronary artery disease. Please note that although the presence of coronary artery calcium documents the presence of coronary artery disease, the severity of this disease and any potential stenosis cannot be assessed on this non-gated CT examination. Assessment for potential risk factor modification, dietary therapy or pharmacologic therapy may be warranted, if clinically indicated. 5. Additional incidental findings, as above. Electronically Signed   By: Vinnie Langton M.D.   On: 11/30/2018 12:02   Ct Abdomen Pelvis W Contrast  Result Date: 11/30/2018 CLINICAL DATA:  63 year old male with history of non-small cell lung cancer. Staging examination. EXAM: CT CHEST, ABDOMEN, AND PELVIS WITH CONTRAST TECHNIQUE: Multidetector CT imaging of the chest, abdomen and pelvis was performed following the standard protocol during bolus administration of intravenous contrast. CONTRAST:  27m OMNIPAQUE IOHEXOL 300 MG/ML  SOLN COMPARISON:  PET-CT 04/25/2017. FINDINGS: CT CHEST FINDINGS Cardiovascular: Heart size is mildly enlarged. There is no significant pericardial fluid, thickening or pericardial calcification. There is aortic atherosclerosis, as well as atherosclerosis of the great vessels of the mediastinum and the coronary arteries, including calcified atherosclerotic plaque in the left main, left anterior descending, left circumflex and right  coronary arteries. Mediastinum/Nodes: No pathologically enlarged mediastinal or hilar lymph nodes. Esophagus  is unremarkable in appearance. No axillary lymphadenopathy. Lungs/Pleura: Previously noted left upper lobe nodule is no longer clearly identifiable. Several fiducial markers are noted in the left upper lobe. On today's examination there is a new 7.8 x 9.5 x 8.3 cm mass-like area (axial image 21 of series 4 and coronal image 105 of series 6) which is centrally hypovascular with multiple internal locules of gas, favored to represent a large centrally necrotic mass, likely with superinfection (i.e., a large pulmonary abscess) with some adjacent postobstructive changes in the remaining portions of the left upper lobe. This is associated with outward bowing of the left major fissure. Some consolidative changes in volume loss are noted in the anterior and lateral aspect of the left lower lobe. Moderate left pleural effusion predominantly loculated in a subpulmonic position. New right lower lobe pulmonary nodule (axial image 131 of series 5) measuring 2.8 x 1.8 cm, concerning for metastatic lesion. No right pleural effusion. Musculoskeletal: Lytic lesion in the left side of the manubrium measuring 2.5 x 1.7 cm (axial image 13 of series 4). CT ABDOMEN PELVIS FINDINGS Hepatobiliary: No suspicious cystic or solid hepatic lesions. No intra or extrahepatic biliary ductal dilatation. Gallbladder is unremarkable in appearance. Pancreas: No pancreatic mass. No pancreatic ductal dilatation. No pancreatic or peripancreatic fluid or inflammatory changes. Spleen: Unremarkable. Adrenals/Urinary Tract: Multiple new right adrenal nodules and masses, compatible with metastatic disease, largest of which measures 2.8 x 3.8 cm (axial image 71 of series 4). Left adrenal gland is normal in appearance. Left kidney is normal in appearance. Multiple low-attenuation lesions in the right kidney, compatible with simple cysts measuring up to 4.2 cm in the interpolar region. In the lower pole of the right kidney there is also an exophytic intermediate  attenuation lesion which is incompletely characterized, but similar to prior PET-CT 04/25/2017, favored to represent a mildly proteinaceous/hemorrhagic cyst. No hydroureteronephrosis. Urinary bladder is normal in appearance. Stomach/Bowel: Normal appearance of the stomach. No pathologic dilatation of small bowel or colon. Numerous colonic diverticulae are noted, particularly in the proximal sigmoid colon, without surrounding inflammatory changes to suggest an acute diverticulitis at this time. Normal appendix. Vascular/Lymphatic: Aortic atherosclerosis, without evidence of aneurysm or dissection in the abdominal or pelvic vasculature. No lymphadenopathy noted in the abdomen or pelvis. Reproductive: Prostate gland and seminal vesicles are unremarkable in appearance. Other: No significant volume of ascites.  No pneumoperitoneum. Musculoskeletal: Large lytic lesion in the right side of the S1 vertebra measuring 3.2 x 4.2 cm (axial image 97 of series 4). IMPRESSION: 1. Large centrally necrotic mass in the left upper lobe, likely to reflect treated neoplasm. Given the appearance of this lesion on today's examination, this is favored to reflect a large pulmonary abscess at this time. This is associated with a moderate loculated left pleural effusion, largest component of which is subpulmonic in position. 2. New 2.8 x 1.8 cm right lower lobe pulmonary nodule, multiple right adrenal nodules and masses, and new lytic lesions in the left side of the manubrium and the right S1 vertebra, compatible with widespread metastatic disease. 3. Colonic diverticulosis without evidence of acute diverticulitis at this time. 4. Aortic atherosclerosis, in addition to left main and 3 vessel coronary artery disease. Please note that although the presence of coronary artery calcium documents the presence of coronary artery disease, the severity of this disease and any potential stenosis cannot be assessed on this non-gated CT examination.  Assessment for potential  risk factor modification, dietary therapy or pharmacologic therapy may be warranted, if clinically indicated. 5. Additional incidental findings, as above. Electronically Signed   By: Vinnie Langton M.D.   On: 11/30/2018 12:02   Dg Chest Port 1 View  Result Date: 12/05/2018 CLINICAL DATA:  Pleural effusion. EXAM: PORTABLE CHEST 1 VIEW COMPARISON:  CT scan and radiograph of November 30, 2018. FINDINGS: Stable near complete opacification of left hemithorax is noted, with stable aerated lung seen in the left lower lobe. This is most consistent with pneumonia or atelectasis with associated pleural effusion. Mild mediastinal shift from right to left is noted. Atherosclerosis of thoracic aorta is noted. No pneumothorax is noted. Bony thorax is unremarkable. IMPRESSION: Stable left lung findings as described above. Aortic Atherosclerosis (ICD10-I70.0). Electronically Signed   By: Marijo Conception M.D.   On: 12/05/2018 07:41   Nm Scan Tumor Localize With Spect  Result Date: 11/25/2018 CLINICAL DATA:  HEART FAILURE. CONCERN FOR CARDIAC AMYLOIDOSIS. EXAM: NUCLEAR MEDICINE TUMOR LOCALIZATION. PYP CARDIAC AMYLOIDOSIS SCAN WITH SPECT TECHNIQUE: Following intravenous administration of radiopharmaceutical, anterior planar images of the chest were obtained. Regions of interest were placed on the heart and contralateral chest wall for quantitative assessment. Additional SPECT imaging of the chest was obtained. RADIOPHARMACEUTICALS:  21.7 mCi TECHNETIUM 99 PYROPHOSPHATE FINDINGS: Planar Visual assessment: Anterior planar imaging demonstrates radiotracer uptake within the heart less than than uptake within the adjacent ribs (Grade 1). Quantitative assessment : Quantitative assessment of the cardiac uptake compared to the contralateral chest wall is equal to 1.0 (H/CL = 1.0). SPECT assessment: SPECT imaging of the chest demonstrates minimal radiotracer accumulation within the LEFT ventricle. IMPRESSION:  Visual and quantitative assessment (grade 1, H/CLL equal 1.0) are NOT suggestive of transthyretin amyloidosis. Electronically Signed   By: Suzy Bouchard M.D.   On: 11/25/2018 14:33   Ir Thoracentesis Asp Pleural Space W/img Guide  Result Date: 12/06/2018 INDICATION: History of lung cancer. Recurrent left-sided pleural effusion. Request therapeutic thoracentesis. EXAM: ULTRASOUND GUIDED LEFT THORACENTESIS MEDICATIONS: None. COMPLICATIONS: None immediate. PROCEDURE: An ultrasound guided thoracentesis was thoroughly discussed with the patient and questions answered. The benefits, risks, alternatives and complications were also discussed. The patient understands and wishes to proceed with the procedure. Written consent was obtained. Ultrasound of the left chest demonstrates that the effusion has become severely loculated in the interval since previous procedure. Ultrasound was performed to localize and mark an adequate pocket of fluid in the left chest. The area was then prepped and draped in the normal sterile fashion. 1% Lidocaine was used for local anesthesia. Under ultrasound guidance a 6 Fr Safe-T-Centesis catheter was introduced. Thoracentesis was performed. The catheter was removed and a dressing applied. FINDINGS: A total of approximately 800 mL of clear, dark yellow fluid was removed. IMPRESSION: Successful ultrasound guided left thoracentesis yielding 800 mL of pleural fluid. Left pleural effusion has become markedly loculated in the interval. Read by: Ascencion Dike PA-C Electronically Signed   By: Markus Daft M.D.   On: 12/06/2018 10:40   Ir Thoracentesis Asp Pleural Space W/img Guide  Result Date: 11/21/2018 INDICATION: Patient with history of lung cancer, now with pleural effusion. Request is made for diagnostic and therapeutic left thoracentesis. EXAM: ULTRASOUND GUIDED DIAGNOSTIC AND THERAPEUTIC LEFT THORACENTESIS MEDICATIONS: 10 mL 1% lidocaine COMPLICATIONS: None immediate. PROCEDURE: An  ultrasound guided thoracentesis was thoroughly discussed with the patient and questions answered. The benefits, risks, alternatives and complications were also discussed. The patient understands and wishes to proceed with the procedure. Written consent  was obtained. Ultrasound was performed to localize and mark an adequate pocket of fluid in the left chest. The area was then prepped and draped in the normal sterile fashion. 1% Lidocaine was used for local anesthesia. Under ultrasound guidance a 6 Fr Safe-T-Centesis catheter was introduced. Thoracentesis was performed. The catheter was removed and a dressing applied. FINDINGS: A total of approximately 1.3 liters of yellow fluid was removed. Samples were sent to the laboratory as requested by the clinical team. IMPRESSION: Successful ultrasound guided diagnostic and therapeutic left thoracentesis yielding 1.3 liters of pleural fluid. Read by: Brynda Greathouse PA-C Electronically Signed   By: Markus Daft M.D.   On: 11/21/2018 12:50   US Thoracentesis Asp Pleural Space W/img Guide  Result Date: 11/30/2018 INDICATION: Patient with history of stage 3 SCC of the left upper lobe s/p radiation and incomplete chemotherapy, COPD, CHF who presented to [facility] ED this morning with complaints of weakness and dyspnea. Noted to have recurrent left pleural effusion - last thoracentesis 11/21/18 yielding 1.3 L pleural fluid. Request has been made to IR for diagnostic and therapeutic thoracentesis. EXAM: ULTRASOUND GUIDED LEFT THORACENTESIS MEDICATIONS: 8 mL 1% lidocaine. COMPLICATIONS: None immediate. PROCEDURE: An ultrasound guided thoracentesis was thoroughly discussed with the patient and questions answered. The benefits, risks, alternatives and complications were also discussed. The patient understands and wishes to proceed with the procedure. Written consent was obtained. Ultrasound was performed to localize and mark an adequate pocket of fluid in the left chest. The area was then  prepped and draped in the normal sterile fashion. 1% Lidocaine was used for local anesthesia. Under ultrasound guidance a 6 Fr Safe-T-Centesis catheter was introduced. Thoracentesis was performed. The catheter was removed and a dressing applied. FINDINGS: A total of approximately 1.9 L of golden yellow fluid was removed. Procedure was aborted due to patient complaints of chest pain - some residual fluid remains on post procedure ultrasound. samples were sent to the laboratory as requested by the clinical team. IMPRESSION: Successful ultrasound guided left thoracentesis yielding 1.9 L of pleural fluid. Read by Candiss Norse, PA-C Electronically Signed   By: Aletta Edouard M.D.   On: 11/30/2018 10:51     Antimicrobials:   completed   Subjective: Remained in hospital 2/2 cancelled d/c 2/2 no safe d/c plan  Objective: Vitals:   12/09/18 0558 12/09/18 0628 12/09/18 0741 12/09/18 1019  BP: (!) 186/61 (!) 168/70  (!) 169/56  Pulse: 61   65  Resp:      Temp: 97.9 F (36.6 C)     TempSrc: Oral     SpO2: 93%  94%   Weight: 89.2 kg     Height:        Intake/Output Summary (Last 24 hours) at 12/09/2018 1217 Last data filed at 12/09/2018 3888 Gross per 24 hour  Intake 720 ml  Output 200 ml  Net 520 ml   Filed Weights   12/07/18 0453 12/08/18 0616 12/09/18 0558  Weight: 90.7 kg 90.7 kg 89.2 kg    Examination:  General exam: Appears calm and comfortable  Respiratory system: rales left side, no acc muscle use, no rhonchi, nl resp rate. Cardiovascular system: S1 & S2 heard, RRR. No JVD, murmurs, rubs, gallops or clicks. No pedal edema. Gastrointestinal system: Abdomen is nondistended, soft and nontender. No organomegaly or masses felt. Normal bowel sounds heard. Central nervous system: Alert and oriented. No focal neurological deficits. Extremities: Symmetric 5 x 5 power. Skin: No rashes, lesions or ulcers Psychiatry: Judgement and insight  appear normal. Mood & affect appropriate.      Data Reviewed: I have personally reviewed following labs and imaging studies  CBC: Recent Labs  Lab 12/03/18 0337 12/04/18 0227 12/05/18 0339 12/06/18 0314 12/06/18 1839 12/07/18 0220 12/08/18 0332  WBC 8.2 6.1 7.0  --   --   --   --   NEUTROABS  --   --  5.6  --   --   --   --   HGB 7.1* 6.1* 7.1* 6.9* 7.6* 7.6* 7.3*  HCT 23.4* 20.6* 23.2* 22.5* 24.3* 24.6* 23.2*  MCV 78.3* 77.7* 79.5*  --   --   --   --   PLT 295 262 284  --   --   --   --    Basic Metabolic Panel: Recent Labs  Lab 12/03/18 0337 12/04/18 0227 12/05/18 0339  NA 135 136 136  K 4.0 4.2 4.1  CL 102 103 103  CO2 _0 GLUCOSE 171* 105* 100*  BUN 24* 25* 26*  CREATININE 1.56* 1.30* 1.35*  CALCIUM 8.0* 8.0* 8.2*   GFR: Estimated Creatinine Clearance: 61.2 mL/min (A) (by C-G formula based on SCr of 1.35 mg/dL (H)). Liver Function Tests: Recent Labs  Lab 12/05/18 0339  AST 28  ALT 21  ALKPHOS 173*  BILITOT 0.6  PROT 6.0*  ALBUMIN 1.4*   No results for input(s): LIPASE, AMYLASE in the last 168 hours. No results for input(s): AMMONIA in the last 168 hours. Coagulation Profile: No results for input(s): INR, PROTIME in the last 168 hours. Cardiac Enzymes: No results for input(s): CKTOTAL, CKMB, CKMBINDEX, TROPONINI in the last 168 hours. BNP (last 3 results) No results for input(s): PROBNP in the last 8760 hours. HbA1C: No results for input(s): HGBA1C in the last 72 hours. CBG: Recent Labs  Lab 12/08/18 0739 12/08/18 1208 12/08/18 2116 12/09/18 0807 12/09/18 1156  GLUCAP 111* 204* 110* 158* 109*   Lipid Profile: No results for input(s): CHOL, HDL, LDLCALC, TRIG, CHOLHDL, LDLDIRECT in the last 72 hours. Thyroid Function Tests: No results for input(s): TSH, T4TOTAL, FREET4, T3FREE, THYROIDAB in the last 72 hours. Anemia Panel: No results for input(s): VITAMINB12, FOLATE, FERRITIN, TIBC, IRON, RETICCTPCT in the last 72 hours. Sepsis Labs: No results for input(s): PROCALCITON,  LATICACIDVEN in the last 168 hours.  Recent Results (from the past 240 hour(s))  Culture, body fluid-bottle     Status: None   Collection Time: 11/30/18 10:56 AM  Result Value Ref Range Status   Specimen Description PLEURAL LEFT  Final   Special Requests NONE  Final   Culture   Final    NO GROWTH 5 DAYS Performed at [facility], [address] [place], [place] [address]    Report Status 12/05/2018 FINAL  Final  Gram stain     Status: None   Collection Time: 11/30/18 10:56 AM  Result Value Ref Range Status   Specimen Description PLEURAL LEFT  Final   Special Requests NONE  Final   Gram Stain   Final    RARE WBC PRESENT,BOTH PMN AND MONONUCLEAR NO ORGANISMS SEEN Performed at [facility], [place] [address], [place], [place] [address]    Report Status 12/01/2018 FINAL  Final         Radiology Studies: No results found.      Scheduled Meds:  amLODipine  5 mg Oral Daily   aspirin EC  81 mg Oral Daily   docusate sodium  100 mg  Oral BID   enoxaparin (LOVENOX) injection  40 mg Subcutaneous Q24H   ferrous sulfate  325 mg Oral TID WC   levothyroxine  112 mcg Oral QAC breakfast   metoprolol succinate  25 mg Oral Daily   nicotine  14 mg Transdermal Daily   pantoprazole  40 mg Oral QHS   umeclidinium-vilanterol  1 puff Inhalation Daily   Continuous Infusions:   LOS: 8 days    Time spent: 25 minutes    Nicolette Bang, MD [facility]  If 7PM-7AM, please contact night-coverage  12/09/2018, 12:17 PM

## 2018-12-10 ENCOUNTER — Other Ambulatory Visit: Payer: Self-pay

## 2018-12-10 ENCOUNTER — Telehealth: Payer: Self-pay | Admitting: *Deleted

## 2018-12-10 ENCOUNTER — Other Ambulatory Visit: Payer: Self-pay | Admitting: Physician Assistant

## 2018-12-10 ENCOUNTER — Telehealth: Payer: Self-pay | Admitting: Internal Medicine

## 2018-12-10 DIAGNOSIS — C3492 Malignant neoplasm of unspecified part of left bronchus or lung: Secondary | ICD-10-CM

## 2018-12-10 DIAGNOSIS — J449 Chronic obstructive pulmonary disease, unspecified: Secondary | ICD-10-CM

## 2018-12-10 DIAGNOSIS — E1169 Type 2 diabetes mellitus with other specified complication: Secondary | ICD-10-CM

## 2018-12-10 DIAGNOSIS — C3490 Malignant neoplasm of unspecified part of unspecified bronchus or lung: Secondary | ICD-10-CM

## 2018-12-10 NOTE — Telephone Encounter (Signed)
Requesting order to start palliative care services.

## 2018-12-10 NOTE — Telephone Encounter (Signed)
Referral done

## 2018-12-10 NOTE — Telephone Encounter (Signed)
Pt was on TCM report he was D/C 12/08/18. Pt has metastatic lung cancer. I have tried calling pt to make hosp f/u not able to get in contact w/pt. Per chart he has not seen MD since 06/2017. Saw that [facility] is asking for referral for palliative care. [name] will they be taking over services, or will pt be still under your care.Marland KitchenJohny Chess

## 2018-12-10 NOTE — Addendum Note (Signed)
Addended by: Biagio Borg on: 12/10/2018 11:57 AM   Modules accepted: Orders

## 2018-12-10 NOTE — Telephone Encounter (Signed)
Verbal order given to Manus Gunning.

## 2018-12-10 NOTE — Telephone Encounter (Signed)
Referral was made earlier today  OK for me as attending MD, but also Hospice MD or Palliative Care MD ok for symptomatic medications

## 2018-12-10 NOTE — Consult Note (Signed)
Late entry for 12/09/2018  1:12 pm (Internet was down)  Medicare [facility] follow up: patient has Medicaid  Spoke with Hassan Rowan inpatient [facility] RNCM regarding patient's disposition.  This writers was unable to reach patient by telephone in patient's room.   However, Hassan Rowan states patient will go to [facility] and referral for [facility] social worker to follow up to assist with homelessness [was not allowed to return to his sister's per notes brother-in-law took keys] and transportation needs to follow up appointments.  Patient is on house arrest. Patient also to be followed by [facility] for home health care needs.  Patient was also referred for [facility] for home based Palliative Care patient was agreeable to follow up needs. Patient referred also for complex disease and care management follow up chronic disease includes but not limited to COPD,  Diabetes, HF, and Stage III (3) Squamous Cell Carcinoma with [facility] Care Management.  For questions, please contact:  Natividad Brood, RN BSN [facility] Liaison  [phone] business mobile phone Toll free office [phone]  Fax number: [phone] [email or web address]  [email or web address]

## 2018-12-11 ENCOUNTER — Other Ambulatory Visit: Payer: Self-pay

## 2018-12-11 ENCOUNTER — Observation Stay (HOSPITAL_COMMUNITY): Payer: Medicare Other

## 2018-12-11 ENCOUNTER — Encounter (HOSPITAL_COMMUNITY): Payer: Self-pay

## 2018-12-11 ENCOUNTER — Inpatient Hospital Stay (HOSPITAL_COMMUNITY)
Admission: EM | Admit: 2018-12-11 | Discharge: 2018-12-13 | DRG: 180 | Disposition: A | Discharge status: Home Health | Payer: Medicare Other | Attending: Internal Medicine | Admitting: Internal Medicine

## 2018-12-11 ENCOUNTER — Other Ambulatory Visit: Payer: Self-pay | Admitting: *Deleted

## 2018-12-11 ENCOUNTER — Emergency Department (HOSPITAL_COMMUNITY): Payer: Medicare Other

## 2018-12-11 DIAGNOSIS — K219 Gastro-esophageal reflux disease without esophagitis: Secondary | ICD-10-CM | POA: Diagnosis not present

## 2018-12-11 DIAGNOSIS — R5381 Other malaise: Secondary | ICD-10-CM | POA: Diagnosis not present

## 2018-12-11 DIAGNOSIS — G4700 Insomnia, unspecified: Secondary | ICD-10-CM | POA: Diagnosis present

## 2018-12-11 DIAGNOSIS — R809 Proteinuria, unspecified: Secondary | ICD-10-CM | POA: Diagnosis present

## 2018-12-11 DIAGNOSIS — Z720 Tobacco use: Secondary | ICD-10-CM | POA: Diagnosis not present

## 2018-12-11 DIAGNOSIS — I509 Heart failure, unspecified: Secondary | ICD-10-CM

## 2018-12-11 DIAGNOSIS — R531 Weakness: Secondary | ICD-10-CM | POA: Diagnosis not present

## 2018-12-11 DIAGNOSIS — E785 Hyperlipidemia, unspecified: Secondary | ICD-10-CM | POA: Diagnosis present

## 2018-12-11 DIAGNOSIS — Z881 Allergy status to other antibiotic agents status: Secondary | ICD-10-CM

## 2018-12-11 DIAGNOSIS — Z20828 Contact with and (suspected) exposure to other viral communicable diseases: Secondary | ICD-10-CM | POA: Diagnosis not present

## 2018-12-11 DIAGNOSIS — I2510 Atherosclerotic heart disease of native coronary artery without angina pectoris: Secondary | ICD-10-CM | POA: Diagnosis present

## 2018-12-11 DIAGNOSIS — E1151 Type 2 diabetes mellitus with diabetic peripheral angiopathy without gangrene: Secondary | ICD-10-CM | POA: Diagnosis present

## 2018-12-11 DIAGNOSIS — C7971 Secondary malignant neoplasm of right adrenal gland: Secondary | ICD-10-CM | POA: Diagnosis not present

## 2018-12-11 DIAGNOSIS — F17210 Nicotine dependence, cigarettes, uncomplicated: Secondary | ICD-10-CM | POA: Diagnosis present

## 2018-12-11 DIAGNOSIS — J449 Chronic obstructive pulmonary disease, unspecified: Secondary | ICD-10-CM | POA: Diagnosis present

## 2018-12-11 DIAGNOSIS — Z6826 Body mass index (BMI) 26.0-26.9, adult: Secondary | ICD-10-CM

## 2018-12-11 DIAGNOSIS — Z66 Do not resuscitate: Secondary | ICD-10-CM | POA: Diagnosis present

## 2018-12-11 DIAGNOSIS — R0602 Shortness of breath: Secondary | ICD-10-CM | POA: Diagnosis present

## 2018-12-11 DIAGNOSIS — Z923 Personal history of irradiation: Secondary | ICD-10-CM

## 2018-12-11 DIAGNOSIS — C3490 Malignant neoplasm of unspecified part of unspecified bronchus or lung: Principal | ICD-10-CM | POA: Diagnosis present

## 2018-12-11 DIAGNOSIS — I10 Essential (primary) hypertension: Secondary | ICD-10-CM

## 2018-12-11 DIAGNOSIS — R627 Adult failure to thrive: Secondary | ICD-10-CM | POA: Diagnosis present

## 2018-12-11 DIAGNOSIS — Z8673 Personal history of transient ischemic attack (TIA), and cerebral infarction without residual deficits: Secondary | ICD-10-CM

## 2018-12-11 DIAGNOSIS — Z885 Allergy status to narcotic agent status: Secondary | ICD-10-CM

## 2018-12-11 DIAGNOSIS — J90 Pleural effusion, not elsewhere classified: Secondary | ICD-10-CM | POA: Diagnosis present

## 2018-12-11 DIAGNOSIS — Z801 Family history of malignant neoplasm of trachea, bronchus and lung: Secondary | ICD-10-CM

## 2018-12-11 DIAGNOSIS — Z515 Encounter for palliative care: Secondary | ICD-10-CM

## 2018-12-11 DIAGNOSIS — J918 Pleural effusion in other conditions classified elsewhere: Secondary | ICD-10-CM | POA: Diagnosis not present

## 2018-12-11 DIAGNOSIS — I248 Other forms of acute ischemic heart disease: Secondary | ICD-10-CM | POA: Diagnosis present

## 2018-12-11 DIAGNOSIS — D638 Anemia in other chronic diseases classified elsewhere: Secondary | ICD-10-CM | POA: Diagnosis present

## 2018-12-11 DIAGNOSIS — Z8619 Personal history of other infectious and parasitic diseases: Secondary | ICD-10-CM

## 2018-12-11 DIAGNOSIS — Z8249 Family history of ischemic heart disease and other diseases of the circulatory system: Secondary | ICD-10-CM

## 2018-12-11 DIAGNOSIS — I5043 Acute on chronic combined systolic (congestive) and diastolic (congestive) heart failure: Secondary | ICD-10-CM | POA: Diagnosis present

## 2018-12-11 DIAGNOSIS — F17200 Nicotine dependence, unspecified, uncomplicated: Secondary | ICD-10-CM

## 2018-12-11 DIAGNOSIS — R918 Other nonspecific abnormal finding of lung field: Secondary | ICD-10-CM | POA: Diagnosis not present

## 2018-12-11 DIAGNOSIS — R7989 Other specified abnormal findings of blood chemistry: Secondary | ICD-10-CM | POA: Diagnosis not present

## 2018-12-11 DIAGNOSIS — I110 Hypertensive heart disease with heart failure: Secondary | ICD-10-CM | POA: Diagnosis not present

## 2018-12-11 DIAGNOSIS — Z833 Family history of diabetes mellitus: Secondary | ICD-10-CM

## 2018-12-11 DIAGNOSIS — G4710 Hypersomnia, unspecified: Secondary | ICD-10-CM

## 2018-12-11 DIAGNOSIS — Z8711 Personal history of peptic ulcer disease: Secondary | ICD-10-CM

## 2018-12-11 DIAGNOSIS — Z9119 Patient's noncompliance with other medical treatment and regimen: Secondary | ICD-10-CM

## 2018-12-11 DIAGNOSIS — Z811 Family history of alcohol abuse and dependence: Secondary | ICD-10-CM

## 2018-12-11 DIAGNOSIS — Z9889 Other specified postprocedural states: Secondary | ICD-10-CM

## 2018-12-11 DIAGNOSIS — Z79899 Other long term (current) drug therapy: Secondary | ICD-10-CM

## 2018-12-11 DIAGNOSIS — I341 Nonrheumatic mitral (valve) prolapse: Secondary | ICD-10-CM | POA: Diagnosis present

## 2018-12-11 DIAGNOSIS — R778 Other specified abnormalities of plasma proteins: Secondary | ICD-10-CM | POA: Diagnosis present

## 2018-12-11 DIAGNOSIS — Z888 Allergy status to other drugs, medicaments and biological substances status: Secondary | ICD-10-CM

## 2018-12-11 DIAGNOSIS — E039 Hypothyroidism, unspecified: Secondary | ICD-10-CM | POA: Diagnosis not present

## 2018-12-11 DIAGNOSIS — Z590 Homelessness: Secondary | ICD-10-CM

## 2018-12-11 DIAGNOSIS — Z86718 Personal history of other venous thrombosis and embolism: Secondary | ICD-10-CM

## 2018-12-11 DIAGNOSIS — J438 Other emphysema: Secondary | ICD-10-CM

## 2018-12-11 DIAGNOSIS — R0902 Hypoxemia: Secondary | ICD-10-CM | POA: Diagnosis not present

## 2018-12-11 DIAGNOSIS — H538 Other visual disturbances: Secondary | ICD-10-CM | POA: Diagnosis present

## 2018-12-11 HISTORY — PX: IR THORACENTESIS ASP PLEURAL SPACE W/IMG GUIDE: IMG5380

## 2018-12-11 LAB — URINALYSIS, ROUTINE W REFLEX MICROSCOPIC
Bacteria, UA: NONE SEEN
Bilirubin Urine: NEGATIVE
Glucose, UA: 50 mg/dL — AB
Ketones, ur: NEGATIVE mg/dL
Leukocytes,Ua: NEGATIVE
Nitrite: NEGATIVE
Protein, ur: >=300 mg/dL — AB
Specific Gravity, Urine: 1.014 (ref 1.005–1.030)
pH: 6 (ref 5.0–8.0)

## 2018-12-11 LAB — COMPREHENSIVE METABOLIC PANEL
ALT: 31 U/L (ref 0–44)
AST: 39 U/L (ref 15–41)
Albumin: 1.5 g/dL — ABNORMAL LOW (ref 3.5–5.0)
Alkaline Phosphatase: 220 U/L — ABNORMAL HIGH (ref 38–126)
Anion gap: 10 (ref 5–15)
BUN: 28 mg/dL — ABNORMAL HIGH (ref 8–23)
CO2: 22 mmol/L (ref 22–32)
Calcium: 8.2 mg/dL — ABNORMAL LOW (ref 8.9–10.3)
Chloride: 101 mmol/L (ref 98–111)
Creatinine, Ser: 1.16 mg/dL (ref 0.61–1.24)
GFR calc Af Amer: >60 mL/min (ref >60)
GFR calc non Af Amer: >60 mL/min (ref >60)
Glucose, Bld: 131 mg/dL — ABNORMAL HIGH (ref 70–99)
Potassium: 3.6 mmol/L (ref 3.5–5.1)
Sodium: 133 mmol/L — ABNORMAL LOW (ref 135–145)
Total Bilirubin: 0.7 mg/dL (ref 0.3–1.2)
Total Protein: 6.6 g/dL (ref 6.5–8.1)

## 2018-12-11 LAB — TYPE AND SCREEN
ABO/RH(D): B POS
Antibody Screen: NEGATIVE

## 2018-12-11 LAB — CBC WITH DIFFERENTIAL/PLATELET
Abs Immature Granulocytes: 0.05 10*3/uL (ref 0.00–0.07)
Basophils Absolute: 0 10*3/uL (ref 0.0–0.1)
Basophils Relative: 0 %
Eosinophils Absolute: 0 10*3/uL (ref 0.0–0.5)
Eosinophils Relative: 0 %
HCT: 28.7 % — ABNORMAL LOW (ref 39.0–52.0)
Hemoglobin: 8.8 g/dL — ABNORMAL LOW (ref 13.0–17.0)
Immature Granulocytes: 0 %
Lymphocytes Relative: 3 %
Lymphs Abs: 0.4 10*3/uL — ABNORMAL LOW (ref 0.7–4.0)
MCH: 25.2 pg — ABNORMAL LOW (ref 26.0–34.0)
MCHC: 30.7 g/dL (ref 30.0–36.0)
MCV: 82.2 fL (ref 80.0–100.0)
Monocytes Absolute: 0.8 10*3/uL (ref 0.1–1.0)
Monocytes Relative: 6 %
Neutro Abs: 11.2 10*3/uL — ABNORMAL HIGH (ref 1.7–7.7)
Neutrophils Relative %: 91 %
Platelets: 377 10*3/uL (ref 150–400)
RBC: 3.49 MIL/uL — ABNORMAL LOW (ref 4.22–5.81)
RDW: 18.7 % — ABNORMAL HIGH (ref 11.5–15.5)
WBC: 12.5 10*3/uL — ABNORMAL HIGH (ref 4.0–10.5)
nRBC: 0 % (ref 0.0–0.2)

## 2018-12-11 LAB — PROTIME-INR
INR: 1.5 — ABNORMAL HIGH (ref 0.8–1.2)
Prothrombin Time: 18 seconds — ABNORMAL HIGH (ref 11.4–15.2)

## 2018-12-11 LAB — POC OCCULT BLOOD, ED: Fecal Occult Bld: POSITIVE — AB

## 2018-12-11 LAB — CREATININE, URINE, RANDOM: Creatinine, Urine: <10 mg/dL

## 2018-12-11 LAB — PROTEIN, URINE, RANDOM: Total Protein, Urine: 75 mg/dL

## 2018-12-11 LAB — TROPONIN I
Troponin I: 0.48 ng/mL (ref <0.03)
Troponin I: 0.86 ng/mL (ref <0.03)
Troponin I: 1.34 ng/mL (ref <0.03)

## 2018-12-11 LAB — SARS CORONAVIRUS 2 BY RT PCR (HOSPITAL ORDER, PERFORMED IN ~~LOC~~ HOSPITAL LAB): SARS Coronavirus 2: NEGATIVE

## 2018-12-11 LAB — BRAIN NATRIURETIC PEPTIDE: B Natriuretic Peptide: 1993.5 pg/mL — ABNORMAL HIGH (ref 0.0–100.0)

## 2018-12-11 MED ORDER — ASPIRIN EC 81 MG PO TBEC
81.0000 mg | DELAYED_RELEASE_TABLET | Freq: Every day | ORAL | Status: DC
  Administered 2018-12-12 – 2018-12-13 (×2): 81 mg via ORAL
  Filled 2018-12-11 (×2): qty 1

## 2018-12-11 MED ORDER — ASPIRIN 81 MG PO CHEW
324.0000 mg | CHEWABLE_TABLET | Freq: Once | ORAL | Status: AC
  Administered 2018-12-11: 324 mg via ORAL
  Filled 2018-12-11: qty 4

## 2018-12-11 MED ORDER — SODIUM CHLORIDE 0.9% FLUSH
3.0000 mL | Freq: Two times a day (BID) | INTRAVENOUS | Status: DC
  Administered 2018-12-11 – 2018-12-13 (×5): 3 mL via INTRAVENOUS

## 2018-12-11 MED ORDER — POTASSIUM CHLORIDE CRYS ER 20 MEQ PO TBCR
40.0000 meq | EXTENDED_RELEASE_TABLET | Freq: Every day | ORAL | Status: DC
  Administered 2018-12-11 – 2018-12-13 (×3): 40 meq via ORAL
  Filled 2018-12-11 (×3): qty 2

## 2018-12-11 MED ORDER — LEVOTHYROXINE SODIUM 112 MCG PO TABS
112.0000 ug | ORAL_TABLET | Freq: Every day | ORAL | Status: DC
  Administered 2018-12-11 – 2018-12-13 (×3): 112 ug via ORAL
  Filled 2018-12-11 (×3): qty 1

## 2018-12-11 MED ORDER — AMLODIPINE BESYLATE 5 MG PO TABS
5.0000 mg | ORAL_TABLET | Freq: Every day | ORAL | Status: DC
  Administered 2018-12-11 – 2018-12-13 (×3): 5 mg via ORAL
  Filled 2018-12-11 (×3): qty 2

## 2018-12-11 MED ORDER — LIDOCAINE HCL 1 % IJ SOLN
INTRAMUSCULAR | Status: AC
  Filled 2018-12-11: qty 20

## 2018-12-11 MED ORDER — SODIUM CHLORIDE 0.9 % IV SOLN: 250.0000 mL | INTRAVENOUS | Status: DC | PRN

## 2018-12-11 MED ORDER — ACETAMINOPHEN 325 MG PO TABS: 650.0000 mg | ORAL_TABLET | ORAL | Status: DC | PRN

## 2018-12-11 MED ORDER — SODIUM CHLORIDE 0.9% FLUSH: 3.0000 mL | INTRAVENOUS | Status: DC | PRN

## 2018-12-11 MED ORDER — LIDOCAINE HCL (PF) 1 % IJ SOLN
INTRAMUSCULAR | Status: DC | PRN
  Administered 2018-12-11: 5 mL

## 2018-12-11 MED ORDER — ONDANSETRON HCL 4 MG/2ML IJ SOLN: 4.0000 mg | Freq: Four times a day (QID) | INTRAMUSCULAR | Status: DC | PRN

## 2018-12-11 MED ORDER — NICOTINE 21 MG/24HR TD PT24
21.0000 mg | MEDICATED_PATCH | Freq: Every day | TRANSDERMAL | Status: DC
  Administered 2018-12-11 – 2018-12-13 (×3): 21 mg via TRANSDERMAL
  Filled 2018-12-11 (×3): qty 1

## 2018-12-11 MED ORDER — UMECLIDINIUM-VILANTEROL 62.5-25 MCG/INH IN AEPB
1.0000 | INHALATION_SPRAY | Freq: Every day | RESPIRATORY_TRACT | Status: DC
  Administered 2018-12-12 – 2018-12-13 (×2): 1 via RESPIRATORY_TRACT
  Filled 2018-12-11: qty 14

## 2018-12-11 MED ORDER — PANTOPRAZOLE SODIUM 40 MG PO TBEC
40.0000 mg | DELAYED_RELEASE_TABLET | Freq: Every day | ORAL | Status: DC
  Administered 2018-12-11 – 2018-12-12 (×2): 40 mg via ORAL
  Filled 2018-12-11 (×2): qty 1

## 2018-12-11 MED ORDER — FUROSEMIDE 10 MG/ML IJ SOLN
40.0000 mg | INTRAMUSCULAR | Status: AC
  Administered 2018-12-11: 40 mg via INTRAVENOUS
  Filled 2018-12-11: qty 4

## 2018-12-11 MED ORDER — METOPROLOL SUCCINATE ER 25 MG PO TB24
25.0000 mg | ORAL_TABLET | Freq: Every day | ORAL | Status: DC
  Administered 2018-12-11 – 2018-12-13 (×3): 25 mg via ORAL
  Filled 2018-12-11 (×3): qty 1

## 2018-12-11 MED ORDER — FUROSEMIDE 10 MG/ML IJ SOLN
40.0000 mg | Freq: Two times a day (BID) | INTRAMUSCULAR | Status: DC
  Administered 2018-12-11 – 2018-12-13 (×4): 40 mg via INTRAVENOUS
  Filled 2018-12-11 (×4): qty 4

## 2018-12-11 MED ORDER — FERROUS SULFATE 325 (65 FE) MG PO TABS
325.0000 mg | ORAL_TABLET | Freq: Three times a day (TID) | ORAL | Status: DC
  Administered 2018-12-11 – 2018-12-13 (×6): 325 mg via ORAL
  Filled 2018-12-11 (×6): qty 1

## 2018-12-11 MED ORDER — IPRATROPIUM-ALBUTEROL 0.5-2.5 (3) MG/3ML IN SOLN: 3.0000 mL | Freq: Four times a day (QID) | RESPIRATORY_TRACT | Status: DC | PRN

## 2018-12-11 NOTE — ED Notes (Signed)
Attempted to call report

## 2018-12-11 NOTE — Telephone Encounter (Signed)
Noted../lmb 

## 2018-12-11 NOTE — Procedures (Signed)
PROCEDURE SUMMARY:  Successful image-guided left thoracentesis. Yielded 200 mL of clear gold fluid. Procedure was stopped after 200 mL due to complexity of pleural effusion. Patient tolerated procedure well. No immediate complications. EBL = 0 mL.  Specimen was not sent for labs. CXR ordered.  Claris Pong Louk PA-C 12/11/2018 2:36 PM

## 2018-12-11 NOTE — ED Notes (Signed)
Dr. Rex Kras aware of fecal occult test.

## 2018-12-11 NOTE — ED Notes (Signed)
ED TO INPATIENT HANDOFF REPORT  ED Nurse Name and Phone #: Ardelle Park [phone]  S Name/Age/Gender Brad [name] 63 y.o. male Room/Bed: 015C/015C  Code Status   Code Status: DNR  Home/SNF/Other Home - pt is homeless,unsure of discharge plan  Patient oriented to: situation Is this baseline? Yes   Triage Complete: Triage complete  Chief Complaint sob  Triage Note Per [facility]: Pt coming in for shortness of breath. Pt recently diagnosed with lung cancer, also has hx of CHF. Pt with increased malaise. Pt with incontinent episode of dark colored stool with EMS.    Allergies Allergies  Allergen Reactions  . Doxycycline Hives and Itching  . Lipitor [Atorvastatin] Other (See Comments)    Myalgia   . Oxycodone Itching    Level of Care/Admitting Diagnosis ED Disposition    ED Disposition Condition Comment   Admit  Hospital Area: [place] [[id number]]  Level of Care: Telemetry Medical [104]  I expect the patient will be discharged within 24 hours: No (not a candidate for 5C-Observation unit)  Covid Evaluation: N/A  Diagnosis: SOB (shortness of breath) [665993]  Admitting Physician: Norval Morton [[id number]]  Attending Physician: Norval Morton [[id number]]  PT Class (Do Not Modify): Observation [104]  PT Acc Code (Do Not Modify): Observation [10022]       B Medical/Surgery History Past Medical History:  Diagnosis Date  . Anemia    takes Ferrous Sulfated daily  . Arthritis   . Carotid stenosis 08/27/2014   takes Aspirin daily  . Chronic hepatitis C ([place]) 09/17/2015  . COPD (chronic obstructive pulmonary disease) (HCC)    Albuterol daily as needed and Spiriva daily  . Depression   . Diabetes mellitus type II    takes Metformin and Glipizide daily  . Dizziness   . GERD (gastroesophageal reflux disease)   . GI bleed 12/2015  . Headache    occasionally  . History of blood clots    in right leg;being followed by Dr.Brabham for this  . History  of blood transfusion 09/2014   2 units-no abnormal reaction noted  . History of gastric ulcer   . History of hiatal hernia   . Hyperlipidemia 08/27/2014   takes Simvastatin daily  . Hypertension    takes Amlodipine and Lisinopril daily  . Hypothyroidism 08/27/2014   takes Synthroid daily  . Insomnia    takes Pamelor nightly  . Joint pain    hips  . Muscle spasm    takes Zanaflex daily as needed  . MVP (mitral valve prolapse)   . Peripheral edema    takes Furosemide daily as needed  . Peripheral neuropathy   . Pleural effusion 11/2018  . Prurigo nodularis   . Stroke [facility])    Past Surgical History:  Procedure Laterality Date  . ENDARTERECTOMY Left 11/13/2014   Procedure: ENDARTERECTOMY CAROTID WITH PATCH ANGIOPLASTY;  Surgeon: Serafina Mitchell, MD;  Location: [facility] OR;  Service: Vascular;  Laterality: Left;  . ESOPHAGOGASTRODUODENOSCOPY N/A 09/24/2014   Procedure: ESOPHAGOGASTRODUODENOSCOPY (EGD);  Surgeon: Winfield Cunas., MD;  Location: [facility] ENDOSCOPY;  Service: Endoscopy;  Laterality: N/A;  . FUDUCIAL PLACEMENT N/A 06/26/2017   Procedure: PLACEMENT OF FUDUCIAL;  Surgeon: Grace Isaac, MD;  Location: [place];  Service: Thoracic;  Laterality: N/A;  . I&D of right arm    . INCISION AND DRAINAGE PERIRECTAL ABSCESS  03/2009  . IR THORACENTESIS ASP PLEURAL SPACE W/IMG GUIDE  11/21/2018  . IR THORACENTESIS ASP PLEURAL SPACE  W/IMG GUIDE  12/06/2018  . Neg Stress Test  2000  . PERIPHERAL VASCULAR CATHETERIZATION N/A 11/30/2015   Procedure: Carotid PTA/Stent Intervention;  Surgeon: Serafina Mitchell, MD;  Location: [place] CV LAB;  Service: Cardiovascular;  Laterality: N/A;  . ROTATOR CUFF REPAIR Right 2009   multiple f/u Sxs/infection Tx  . VASCULAR SURGERY    . VIDEO BRONCHOSCOPY WITH ENDOBRONCHIAL NAVIGATION N/A 05/25/2017   Procedure: VIDEO BRONCHOSCOPY WITH ENDOBRONCHIAL NAVIGATION;  Surgeon: Rigoberto Noel, MD;  Location: [place];  Service: Thoracic;  Laterality: N/A;  . VIDEO  BRONCHOSCOPY WITH ENDOBRONCHIAL NAVIGATION N/A 06/26/2017   Procedure: VIDEO BRONCHOSCOPY WITH ENDOBRONCHIAL NAVIGATION WITH PLACEMENT OF FIDUCIALS;  Surgeon: Grace Isaac, MD;  Location: [place];  Service: Thoracic;  Laterality: N/A;  . VIDEO BRONCHOSCOPY WITH ENDOBRONCHIAL ULTRASOUND N/A 05/25/2017   Procedure: VIDEO BRONCHOSCOPY WITH ENDOBRONCHIAL ULTRASOUND;  Surgeon: Rigoberto Noel, MD;  Location: [place];  Service: Thoracic;  Laterality: N/A;     A IV Location/Drains/Wounds Patient Lines/Drains/Airways Status   Active Line/Drains/Airways    Name:   Placement date:   Placement time:   Site:   Days:   Peripheral IV 12/11/18 Left Antecubital   12/11/18    -    Antecubital   less than 1   External Urinary Catheter   12/11/18    0816    -   less than 1   Incision (Closed) 11/13/14 Neck Left   11/13/14    0723     1489   Incision (Closed) 05/25/17 N/A Other (Comment)   05/25/17    1500     565   Wound / Incision (Open or Dehisced) 11/21/18 Thigh Left;Upper;Lateral pt claimed it's a pressure sore, measured 1 cm x 1 cm   11/21/18    0018    Thigh   20          Intake/Output Last 24 hours No intake or output data in the 24 hours ending 12/11/18 1119  Labs/Imaging Results for orders placed or performed during the hospital encounter of 12/11/18 (from the past 48 hour(s))  Urinalysis, Routine w reflex microscopic     Status: Abnormal   Collection Time: 12/11/18  8:00 AM  Result Value Ref Range   Color, Urine YELLOW YELLOW   APPearance CLEAR CLEAR   Specific Gravity, Urine 1.014 1.005 - 1.030   pH 6.0 5.0 - 8.0   Glucose, UA 50 (A) NEGATIVE mg/dL   Hgb urine dipstick SMALL (A) NEGATIVE   Bilirubin Urine NEGATIVE NEGATIVE   Ketones, ur NEGATIVE NEGATIVE mg/dL   Protein, ur >=300 (A) NEGATIVE mg/dL   Nitrite NEGATIVE NEGATIVE   Leukocytes,Ua NEGATIVE NEGATIVE   RBC / HPF 0-5 0 - 5 RBC/hpf   WBC, UA 0-5 0 - 5 WBC/hpf   Bacteria, UA NONE SEEN NONE SEEN   Squamous Epithelial / LPF  0-5 0 - 5   Mucus PRESENT     Comment: Performed at [facility], [place] [address] [place], [place] [address]  Type and screen [place]     Status: None   Collection Time: 12/11/18  8:04 AM  Result Value Ref Range   ABO/RH(D) B POS    Antibody Screen NEG    Sample Expiration      12/14/2018,2359 Performed at [facility], [place] [address], [place], [place] [address]   SARS Coronavirus 2 (CEPHEID - Performed in [facility] hospital lab), [facility]  Status: None   Collection Time: 12/11/18  8:14 AM  Result Value Ref Range   SARS Coronavirus 2 NEGATIVE NEGATIVE    Comment: (NOTE) If result is NEGATIVE SARS-CoV-2 target nucleic acids are NOT DETECTED. The SARS-CoV-2 RNA is generally detectable in upper and lower  respiratory specimens during the acute phase of infection. The lowest  concentration of SARS-CoV-2 viral copies this assay can detect is 250  copies / mL. A negative result does not preclude SARS-CoV-2 infection  and should not be used as the sole basis for treatment or other  patient management decisions.  A negative result may occur with  improper specimen collection / handling, submission of specimen other  than nasopharyngeal swab, presence of viral mutation(s) within the  areas targeted by this assay, and inadequate number of viral copies  (<250 copies / mL). A negative result must be combined with clinical  observations, patient history, and epidemiological information. If result is POSITIVE SARS-CoV-2 target nucleic acids are DETECTED. The SARS-CoV-2 RNA is generally detectable in upper and lower  respiratory specimens dur ing the acute phase of infection.  Positive  results are indicative of active infection with SARS-CoV-2.  Clinical  correlation with patient history and other diagnostic information is  necessary to determine patient infection status.  Positive results do  not rule out bacterial infection or co-infection with other  viruses. If result is PRESUMPTIVE POSTIVE SARS-CoV-2 nucleic acids MAY BE PRESENT.   A presumptive positive result was obtained on the submitted specimen  and confirmed on repeat testing.  While 2019 novel coronavirus  (SARS-CoV-2) nucleic acids may be present in the submitted sample  additional confirmatory testing may be necessary for epidemiological  and / or clinical management purposes  to differentiate between  SARS-CoV-2 and other Sarbecovirus currently known to infect humans.  If clinically indicated additional testing with an alternate test  methodology [phone]) is advised. The SARS-CoV-2 RNA is generally  detectable in upper and lower respiratory sp ecimens during the acute  phase of infection. The expected result is Negative. Fact Sheet for Patients:  StrictlyIdeas.no Fact Sheet for Healthcare Providers: BankingDealers.co.za This test is not yet approved or cleared by the [place] FDA and has been authorized for detection and/or diagnosis of SARS-CoV-2 by FDA under an Emergency Use Authorization (EUA).  This EUA will remain in effect (meaning this test can be used) for the duration of the COVID-19 declaration under Section 564(b)(1) of the Act, 21 U.S.C. section 360bbb-3(b)(1), unless the authorization is terminated or revoked sooner. Performed at [facility], [place] [address], [place], [place] [address]   POC occult blood, ED RN will collect     Status: Abnormal   Collection Time: 12/11/18  8:15 AM  Result Value Ref Range   Fecal Occult Bld POSITIVE (A) NEGATIVE  Comprehensive metabolic panel     Status: Abnormal   Collection Time: 12/11/18  8:18 AM  Result Value Ref Range   Sodium 133 (L) 135 - 145 mmol/L   Potassium 3.6 3.5 - 5.1 mmol/L   Chloride 101 98 - 111 mmol/L   CO2 22 22 - 32 mmol/L   Glucose, Bld 131 (H) 70 - 99 mg/dL   BUN 28 (H) 8 - 23 mg/dL   Creatinine, Ser 1.16 0.61 - 1.24 mg/dL   Calcium  8.2 (L) 8.9 - 10.3 mg/dL   Total Protein 6.6 6.5 - 8.1 g/dL   Albumin 1.5 (L) 3.5 - 5.0 g/dL   AST 39 15 -  41 U/L   ALT 31 0 - 44 U/L   Alkaline Phosphatase 220 (H) 38 - 126 U/L   Total Bilirubin 0.7 0.3 - 1.2 mg/dL   GFR calc non Af Amer >60 >60 mL/min   GFR calc Af Amer >60 >60 mL/min   Anion gap 10 5 - 15    Comment: Performed at [facility] [place] [address] [place], [place] [address]  CBC with Differential     Status: Abnormal   Collection Time: 12/11/18  8:18 AM  Result Value Ref Range   WBC 12.5 (H) 4.0 - 10.5 K/uL   RBC 3.49 (L) 4.22 - 5.81 MIL/uL   Hemoglobin 8.8 (L) 13.0 - 17.0 g/dL   HCT 28.7 (L) 39.0 - 52.0 %   MCV 82.2 80.0 - 100.0 fL   MCH 25.2 (L) 26.0 - 34.0 pg   MCHC 30.7 30.0 - 36.0 g/dL   RDW 18.7 (H) 11.5 - 15.5 %   Platelets 377 150 - 400 K/uL   nRBC 0.0 0.0 - 0.2 %   Neutrophils Relative % 91 %   Neutro Abs 11.2 (H) 1.7 - 7.7 K/uL   Lymphocytes Relative 3 %   Lymphs Abs 0.4 (L) 0.7 - 4.0 K/uL   Monocytes Relative 6 %   Monocytes Absolute 0.8 0.1 - 1.0 K/uL   Eosinophils Relative 0 %   Eosinophils Absolute 0.0 0.0 - 0.5 K/uL   Basophils Relative 0 %   Basophils Absolute 0.0 0.0 - 0.1 K/uL   Immature Granulocytes 0 %   Abs Immature Granulocytes 0.05 0.00 - 0.07 K/uL    Comment: Performed at [facility], [place] [address] [place], [place] [address]  Troponin I - Once     Status: Abnormal   Collection Time: 12/11/18  8:18 AM  Result Value Ref Range   Troponin I 0.48 (HH) <0.03 ng/mL    Comment: CRITICAL RESULT CALLED TO, READ BACK BY AND VERIFIED WITH: J EASILY RN [phone] [id number] BY A BENNETT Performed at [facility], [place] [address] [place], [place] [address]   Brain natriuretic peptide     Status: Abnormal   Collection Time: 12/11/18  8:18 AM  Result Value Ref Range   B Natriuretic Peptide 1,993.5 (H) 0.0 - 100.0 pg/mL    Comment: Performed at [facility] [address], [place], [place] [address]  Protime-INR     Status:  Abnormal   Collection Time: 12/11/18  8:18 AM  Result Value Ref Range   Prothrombin Time 18.0 (H) 11.4 - 15.2 seconds   INR 1.5 (H) 0.8 - 1.2    Comment: (NOTE) INR goal varies based on device and disease states. Performed at [facility], [place] [address] [place], [place] [address]    Dg Chest 2 View  Result Date: 12/11/2018 CLINICAL DATA:  Ongoing shortness of breath.  Recent thoracentesis EXAM: CHEST - 2 VIEW COMPARISON:  Five days ago FINDINGS: Extensive opacification and volume loss on the left. The right chest is comparatively clear with improved interstitial opacity since prior. Normal heart size which is displaced to the left. IMPRESSION: Unchanged extensive opacification and volume loss on the left where there is a necrotic lung mass. Electronically Signed   By: Monte Fantasia M.D.   On: 12/11/2018 08:50    Pending Labs Unresulted Labs (From admission, onward)    Start     Ordered   12/12/18 2952  Basic metabolic panel  Daily,  R     12/11/18 1115   12/12/18 0500  CBC WITH DIFFERENTIAL  Tomorrow morning,   R     12/11/18 1115          Vitals/Pain Today's Vitals   12/11/18 0805 12/11/18 0812 12/11/18 1045 12/11/18 1100  BP: (!) 189/74  (!) 156/66 (!) 160/65  Pulse: 78  (!) 51 65  Resp: (!) 32  18 (!) 22  Temp: 97.9 F (36.6 C)     TempSrc: Oral     SpO2: 96%  96% 97%  PainSc:  0-No pain      Isolation Precautions No active isolations  Medications Medications  pantoprazole (PROTONIX) EC tablet 40 mg (has no administration in time range)  ferrous sulfate tablet 325 mg (has no administration in time range)  umeclidinium-vilanterol (ANORO ELLIPTA) 62.5-25 MCG/INH 1 puff (has no administration in time range)  levothyroxine (SYNTHROID) tablet 112 mcg (has no administration in time range)  aspirin EC tablet 81 mg (has no administration in time range)  sodium chloride flush (NS) 0.9 % injection 3 mL (has no administration in time range)  sodium chloride flush  (NS) 0.9 % injection 3 mL (has no administration in time range)  0.9 %  sodium chloride infusion (has no administration in time range)  acetaminophen (TYLENOL) tablet 650 mg (has no administration in time range)  ondansetron (ZOFRAN) injection 4 mg (has no administration in time range)  furosemide (LASIX) injection 40 mg (has no administration in time range)  potassium chloride SA (K-DUR) CR tablet 40 mEq (has no administration in time range)  metoprolol succinate (TOPROL-XL) 24 hr tablet 25 mg (has no administration in time range)  amLODipine (NORVASC) tablet 5 mg (has no administration in time range)  ipratropium-albuterol (DUONEB) 0.5-2.5 (3) MG/3ML nebulizer solution 3 mL (has no administration in time range)  aspirin chewable tablet 324 mg (324 mg Oral Given 12/11/18 1021)  furosemide (LASIX) injection 40 mg (40 mg Intravenous Given 12/11/18 1021)    Mobility walks with person assist Moderate fall risk   Focused Assessments Pulmonary Assessment Handoff:  Lung sounds: Bilateral Breath Sounds: Clear, Diminished L Breath Sounds: Diminished R Breath Sounds: Diminished O2 Device: Room Air        R Recommendations: See Admitting Provider Note  Report given to:   Additional Notes: Pt has on condom cath.

## 2018-12-11 NOTE — Patient Outreach (Signed)
[facility]) Care Management  12/11/2018  Brad [name] 1956-04-16 [id number]   Referral received from hospital liaison as member was recently admitted to hospital 4/24-5/4 for weakness & pleural effusion.  Per chart, he has history of COPD, GERD, CVA, diabetes, hypothyroidism, CHF, HTN, and Stage 3 lung cancer.  Primary MD office noted to complete transition of care assessment however member was readmitted back to hospital today, currently under observation status.  [name] hospital liaisons notified, this care manager will follow up pending disposition.  [name], [place], MSN [place] [phone]

## 2018-12-11 NOTE — ED Notes (Signed)
Pt given bed bath. Linen changed. Condom cath applied.

## 2018-12-11 NOTE — H&P (Addendum)
History and Physical    Brad [name] MCN:[id number] DOB: 1956-03-14 DOA: 12/11/2018  Referring MD/NP/PA: Theotis Burrow, MD PCP: Biagio Borg, MD  Consultants: [place]; Julien Nordmann - oncology; Tammi Klippel - rad onc Patient coming from: Via EMS  Chief Complaint: " Could not get up"  I have personally briefly reviewed patient's old medical records in [place]   HPI: Brad [name] is a 63 y.o. male with medical history significant of COPD, Stage III Squamous Cell Cancer of the left upper lobe (s/p radiation and incomplete chemotherapy), CHF (EF 55-60%, G2DD by TTE 09/2015), T2DM, HTN, PVD, Chronic Hepatitis C s/p [place], Anemia, Hx of CVA, Hx ofDuodenalUlcers, and Hx or RLE DVT no longer on anticoagulation; who presents with complaints of weakness and shortness of breath.  Patient just recently discharged after being hospitalized from 4/24-5/3, for CHF exacerbation and weakness.  Patient reports that he no longer has a place to live and prior to discharge had been feeling better.  He did not get his check cash or get any of his medications that he was supposed to be on.  Notes that he is just been sitting wanting to go to the bathroom, but could not move.  Reporting it taking about an hour for him to get up and do anything.  Notes progressively worsening lower extremity swelling most notably on the left lower extremity and shortness of breath with exertion.  Notes associated dry cough and dark stools.  Denies having any fever, chest pain, nausea, vomiting, or diarrhea symptoms.  He continues to smoke approximately 1-2 packs of cigarettes per day  ED Course: Upon admission into the emergency department patient was noted to be afebrile, respirations up to 32, blood pressure 189/74, heart rates maintained, and O2 saturation 96% on room air.  Labs revealed WBC 12.5, hemoglobin 8.8, sodium 133, BUN 28, creatinine 1.16, troponin 0.48, and BNP 1993.5.  Urinalysis significant for > 300 mg/dL  of protein.  Stool guaiacs were noted to be positive.  Chest x-ray showing extensive opacification and volume loss of the left lung field. Patient was given 324 mg of aspirin and 40 mg Lasix IV.  TRH called to admit.  Review of Systems  Constitutional: Positive for malaise/fatigue. Negative for fever.  HENT: Negative for ear pain and nosebleeds.   Eyes: Negative for pain and discharge.  Respiratory: Positive for cough and shortness of breath. Negative for hemoptysis.   Cardiovascular: Positive for leg swelling. Negative for chest pain.  Gastrointestinal: Negative for abdominal pain, nausea and vomiting.  Genitourinary: Negative for dysuria and hematuria.  Musculoskeletal: Positive for joint pain and neck pain. Negative for falls.  Skin: Negative for itching.  Neurological: Positive for weakness. Negative for loss of consciousness.  Endo/Heme/Allergies: Negative for polydipsia. Bruises/bleeds easily.  Psychiatric/Behavioral: Negative for memory loss.    Past Medical History:  Diagnosis Date   Anemia    takes Ferrous Sulfated daily   Arthritis    Carotid stenosis 08/27/2014   takes Aspirin daily   Chronic hepatitis C ([place]) 09/17/2015   COPD (chronic obstructive pulmonary disease) (HCC)    Albuterol daily as needed and Spiriva daily   Depression    Diabetes mellitus type II    takes Metformin and Glipizide daily   Dizziness    GERD (gastroesophageal reflux disease)    GI bleed 12/2015   Headache    occasionally   History of blood clots    in right leg;being followed by Dr.Brabham for this  History of blood transfusion 09/2014   2 units-no abnormal reaction noted   History of gastric ulcer    History of hiatal hernia    Hyperlipidemia 08/27/2014   takes Simvastatin daily   Hypertension    takes Amlodipine and Lisinopril daily   Hypothyroidism 08/27/2014   takes Synthroid daily   Insomnia    takes Pamelor nightly   Joint pain    hips   Muscle spasm     takes Zanaflex daily as needed   MVP (mitral valve prolapse)    Peripheral edema    takes Furosemide daily as needed   Peripheral neuropathy    Pleural effusion 11/2018   Prurigo nodularis    Stroke [facility])     Past Surgical History:  Procedure Laterality Date   ENDARTERECTOMY Left 11/13/2014   Procedure: ENDARTERECTOMY CAROTID WITH PATCH ANGIOPLASTY;  Surgeon: Serafina Mitchell, MD;  Location: [facility] OR;  Service: Vascular;  Laterality: Left;   ESOPHAGOGASTRODUODENOSCOPY N/A 09/24/2014   Procedure: ESOPHAGOGASTRODUODENOSCOPY (EGD);  Surgeon: Winfield Cunas., MD;  Location: [facility] ENDOSCOPY;  Service: Endoscopy;  Laterality: N/A;   FUDUCIAL PLACEMENT N/A 06/26/2017   Procedure: PLACEMENT OF FUDUCIAL;  Surgeon: Grace Isaac, MD;  Location: [place];  Service: Thoracic;  Laterality: N/A;   I&D of right arm     INCISION AND DRAINAGE PERIRECTAL ABSCESS  03/2009   IR THORACENTESIS ASP PLEURAL SPACE W/IMG GUIDE  11/21/2018   IR THORACENTESIS ASP PLEURAL SPACE W/IMG GUIDE  12/06/2018   Neg Stress Test  2000   PERIPHERAL VASCULAR CATHETERIZATION N/A 11/30/2015   Procedure: Carotid PTA/Stent Intervention;  Surgeon: Serafina Mitchell, MD;  Location: [place] CV LAB;  Service: Cardiovascular;  Laterality: N/A;   ROTATOR CUFF REPAIR Right 2009   multiple f/u Sxs/infection Tx   VASCULAR SURGERY     VIDEO BRONCHOSCOPY WITH ENDOBRONCHIAL NAVIGATION N/A 05/25/2017   Procedure: VIDEO BRONCHOSCOPY WITH ENDOBRONCHIAL NAVIGATION;  Surgeon: Rigoberto Noel, MD;  Location: [place] OR;  Service: Thoracic;  Laterality: N/A;   VIDEO BRONCHOSCOPY WITH ENDOBRONCHIAL NAVIGATION N/A 06/26/2017   Procedure: VIDEO BRONCHOSCOPY WITH ENDOBRONCHIAL NAVIGATION WITH PLACEMENT OF FIDUCIALS;  Surgeon: Grace Isaac, MD;  Location: [place];  Service: Thoracic;  Laterality: N/A;   VIDEO BRONCHOSCOPY WITH ENDOBRONCHIAL ULTRASOUND N/A 05/25/2017   Procedure: VIDEO BRONCHOSCOPY WITH ENDOBRONCHIAL ULTRASOUND;  Surgeon:  Rigoberto Noel, MD;  Location: [place];  Service: Thoracic;  Laterality: N/A;     reports that he has been smoking cigarettes. He has a 48.00 pack-year smoking history. He has never used smokeless tobacco. He reports previous alcohol use of about 1.0 standard drinks of alcohol per week. He reports that he does not use drugs.  Allergies  Allergen Reactions   Doxycycline Hives and Itching   Lipitor [Atorvastatin] Other (See Comments)    Myalgia    Oxycodone Itching    Family History  Problem Relation Age of Onset   Heart disease Mother        automated implantable cardioverter-defibrillator    Diabetes type II Mother    Alcohol abuse Father    Lung cancer Father    Lung cancer Maternal Grandmother    Lung cancer Sister     Prior to Admission medications   Medication Sig Start Date End Date Taking? Authorizing Provider  amLODipine (NORVASC) 5 MG tablet Take 1 tablet (5 mg total) by mouth daily for 30 days. 12/09/18 01/08/19  Marcell Anger, MD  aspirin EC 81 MG tablet Take  1 tablet (81 mg total) by mouth daily. Patient not taking: Reported on 11/25/2018 12/21/15   Biagio Borg, MD  Blood Pressure Monitor DEVI Use to check Blood pressure daily Patient not taking: Reported on 11/25/2018 05/25/14   Biagio Borg, MD  ferrous sulfate 325 (65 FE) MG tablet Take 1 tablet (325 mg total) by mouth 3 (three) times daily with meals. Patient not taking: Reported on 11/25/2018 09/25/14   Regalado, Jerald Kief A, MD  furosemide (LASIX) 80 MG tablet Take 1 tablet (80 mg total) by mouth daily for 30 days. Patient not taking: Reported on 11/30/2018 11/27/18 12/27/18  Donne Hazel, MD  glucose blood (ACCU-CHEK AVIVA PLUS) test strip Use to check blood sugars daily Dx E11.9 Patient not taking: Reported on 11/25/2018 11/09/15   Biagio Borg, MD  levothyroxine (SYNTHROID, LEVOTHROID) 112 MCG tablet Take 1 tablet (112 mcg total) daily by mouth. Patient not taking: Reported on 11/25/2018 06/11/17    Biagio Borg, MD  metoprolol succinate (TOPROL-XL) 25 MG 24 hr tablet Take 1 tablet (25 mg total) by mouth daily for 30 days. Patient not taking: Reported on 11/30/2018 11/28/18 12/28/18  Donne Hazel, MD  pantoprazole (PROTONIX) 40 MG tablet Take 1 tablet (40 mg total) by mouth at bedtime for 30 days. Patient not taking: Reported on 11/30/2018 11/27/18 12/27/18  Donne Hazel, MD  umeclidinium-vilanterol [facility] ELLIPTA) 62.5-25 MCG/INH AEPB Inhale 1 puff into the lungs daily. Patient not taking: Reported on 11/25/2018 05/30/17   Rigoberto Noel, MD    Physical Exam:  Constitutional: Chronically ill appearing elderly male in no acute distress Vitals:   12/11/18 0800 12/11/18 0805  BP: (!) 189/74 (!) 189/74  Pulse: 79 78  Resp:  (!) 32  Temp:  97.9 F (36.6 C)  TempSrc:  Oral  SpO2: 96% 96%   Eyes: PERRL, lids and conjunctivae normal ENMT: Mucous membranes are dry posterior pharynx clear of any exudate or lesions.  Neck: normal, supple, no masses, no thyromegaly. Respiratory: Decreased aeration noted on the left lung field.  O2 saturations maintained on room air currently.  Able to talk in short sentences. Cardiovascular: Regular rate and rhythm, no murmurs / rubs / gallops.  2+ lower extremity edema appreciated. 2+ pedal pulses. No carotid bruits.  Abdomen: no tenderness, no masses palpated. No hepatosplenomegaly. Bowel sounds positive.  Musculoskeletal: no clubbing / cyanosis. No joint deformity upper and lower extremities. Good ROM, no contractures. Normal muscle tone.  House arrest bracelet on left lower extremity. Skin: Patient with small areas of bruising noted of the upper extremities Neurologic: CN 2-12 grossly intact. Sensation intact, DTR normal. Strength 4+/5 in all 4.  Psychiatric: Alert and oriented x 3. Normal mood.     Labs on Admission: I have personally reviewed following labs and imaging studies  CBC: Recent Labs  Lab 12/05/18 0339 12/06/18 0314 12/06/18 1839  12/07/18 0220 12/08/18 0332 12/11/18 0818  WBC 7.0  --   --   --   --  12.5*  NEUTROABS 5.6  --   --   --   --  11.2*  HGB 7.1* 6.9* 7.6* 7.6* 7.3* 8.8*  HCT 23.2* 22.5* 24.3* 24.6* 23.2* 28.7*  MCV 79.5*  --   --   --   --  82.2  PLT 284  --   --   --   --  875   Basic Metabolic Panel: Recent Labs  Lab 12/05/18 0339 12/11/18 0818  NA 136 133*  K 4.1 3.6  CL 103 101  CO2 25 22  GLUCOSE 100* 131*  BUN 26* 28*  CREATININE 1.35* 1.16  CALCIUM 8.2* 8.2*   GFR: Estimated Creatinine Clearance: 71.2 mL/min (by C-G formula based on SCr of 1.16 mg/dL). Liver Function Tests: Recent Labs  Lab 12/05/18 0339 12/11/18 0818  AST 28 39  ALT 21 31  ALKPHOS 173* 220*  BILITOT 0.6 0.7  PROT 6.0* 6.6  ALBUMIN 1.4* 1.5*   No results for input(s): LIPASE, AMYLASE in the last 168 hours. No results for input(s): AMMONIA in the last 168 hours. Coagulation Profile: Recent Labs  Lab 12/11/18 0818  INR 1.5*   Cardiac Enzymes: Recent Labs  Lab 12/11/18 0818  TROPONINI 0.48*   BNP (last 3 results) No results for input(s): PROBNP in the last 8760 hours. HbA1C: No results for input(s): HGBA1C in the last 72 hours. CBG: Recent Labs  Lab 12/08/18 0739 12/08/18 1208 12/08/18 2116 12/09/18 0807 12/09/18 1156  GLUCAP 111* 204* 110* 158* 109*   Lipid Profile: No results for input(s): CHOL, HDL, LDLCALC, TRIG, CHOLHDL, LDLDIRECT in the last 72 hours. Thyroid Function Tests: No results for input(s): TSH, T4TOTAL, FREET4, T3FREE, THYROIDAB in the last 72 hours. Anemia Panel: No results for input(s): VITAMINB12, FOLATE, FERRITIN, TIBC, IRON, RETICCTPCT in the last 72 hours. Urine analysis:    Component Value Date/Time   COLORURINE YELLOW 12/11/2018 0800   APPEARANCEUR CLEAR 12/11/2018 0800   LABSPEC 1.014 12/11/2018 0800   PHURINE 6.0 12/11/2018 0800   GLUCOSEU 50 (A) 12/11/2018 0800   GLUCOSEU NEGATIVE 05/10/2016 1434   HGBUR SMALL (A) 12/11/2018 0800   BILIRUBINUR NEGATIVE  12/11/2018 0800   KETONESUR NEGATIVE 12/11/2018 0800   PROTEINUR >=300 (A) 12/11/2018 0800   UROBILINOGEN 0.2 05/10/2016 1434   NITRITE NEGATIVE 12/11/2018 0800   LEUKOCYTESUR NEGATIVE 12/11/2018 0800   Sepsis Labs: Recent Results (from the past 240 hour(s))  SARS Coronavirus 2 (CEPHEID - Performed in [place] hospital lab), Hosp Order     Status: None   Collection Time: 12/11/18  8:14 AM  Result Value Ref Range Status   SARS Coronavirus 2 NEGATIVE NEGATIVE Final    Comment: (NOTE) If result is NEGATIVE SARS-CoV-2 target nucleic acids are NOT DETECTED. The SARS-CoV-2 RNA is generally detectable in upper and lower  respiratory specimens during the acute phase of infection. The lowest  concentration of SARS-CoV-2 viral copies this assay can detect is 250  copies / mL. A negative result does not preclude SARS-CoV-2 infection  and should not be used as the sole basis for treatment or other  patient management decisions.  A negative result may occur with  improper specimen collection / handling, submission of specimen other  than nasopharyngeal swab, presence of viral mutation(s) within the  areas targeted by this assay, and inadequate number of viral copies  (<250 copies / mL). A negative result must be combined with clinical  observations, patient history, and epidemiological information. If result is POSITIVE SARS-CoV-2 target nucleic acids are DETECTED. The SARS-CoV-2 RNA is generally detectable in upper and lower  respiratory specimens dur ing the acute phase of infection.  Positive  results are indicative of active infection with SARS-CoV-2.  Clinical  correlation with patient history and other diagnostic information is  necessary to determine patient infection status.  Positive results do  not rule out bacterial infection or co-infection with other viruses. If result is PRESUMPTIVE POSTIVE SARS-CoV-2 nucleic acids MAY BE PRESENT.   A presumptive positive  result was obtained  on the submitted specimen  and confirmed on repeat testing.  While 2019 novel coronavirus  (SARS-CoV-2) nucleic acids may be present in the submitted sample  additional confirmatory testing may be necessary for epidemiological  and / or clinical management purposes  to differentiate between  SARS-CoV-2 and other Sarbecovirus currently known to infect humans.  If clinically indicated additional testing with an alternate test  methodology [phone]) is advised. The SARS-CoV-2 RNA is generally  detectable in upper and lower respiratory sp ecimens during the acute  phase of infection. The expected result is Negative. Fact Sheet for Patients:  StrictlyIdeas.no Fact Sheet for Healthcare Providers: BankingDealers.co.za This test is not yet approved or cleared by the [place] FDA and has been authorized for detection and/or diagnosis of SARS-CoV-2 by FDA under an Emergency Use Authorization (EUA).  This EUA will remain in effect (meaning this test can be used) for the duration of the COVID-19 declaration under Section 564(b)(1) of the Act, 21 U.S.C. section 360bbb-3(b)(1), unless the authorization is terminated or revoked sooner. Performed at [facility], [place] [address], [place], [place] [address]      Radiological Exams on Admission: Dg Chest 2 View  Result Date: 12/11/2018 CLINICAL DATA:  Ongoing shortness of breath.  Recent thoracentesis EXAM: CHEST - 2 VIEW COMPARISON:  Five days ago FINDINGS: Extensive opacification and volume loss on the left. The right chest is comparatively clear with improved interstitial opacity since prior. Normal heart size which is displaced to the left. IMPRESSION: Unchanged extensive opacification and volume loss on the left where there is a necrotic lung mass. Electronically Signed   By: Monte Fantasia M.D.   On: 12/11/2018 08:50    EKG: Independently reviewed.  Sinus rhythm with LVH and T wave slightly  more peaked, but otherwise similar to previous.  Assessment/Plan Large loculated left-sided pleural effusion: Recurrent.  During patient's last hospitalization he underwent thoracentesis x2 with a total of 2.7 L of fluid removed.  Fluid analysis was thought to be exudative in nature, but did not note any malignant cells.  Today's chest x-ray showing in significant opacification of the left lung field, but patient appears to be able to maintain O2 saturations on room air. - Admit to a telemetry bed - Continuous pulse oximetry with nasal cannula oxygen as needed to keep O2 saturations >92% -N.p.o. -IR guided thoracentesis   Diastolic CHF exacerbation secondary to noncompliance: Acute on chronic.  Patient has been noncompliance medications, but complains of lower extremity swelling.  BNP elevated at 1993.5.  Last echocardiogram on 11/21/18 noted EF of 55 to 60% with left ventricular hypertrophy and bilateral atrial enlargement concerning for infiltrative disease.  With suspect some aspect of heart failure.  - Heart failure orders set  initiated   - Strict I&Os and daily weights - Elevate lower extremities - Lasix 40 mg IV Bid - Reassess in a.m. and adjust diuresis as needed. - Optimize medical management as able - May warrant consultation to cardiology in a.m.   Generalized weakness: Likely multifactorial in the setting of anemia, uncontrolled hypothyroidism, and malignancy -PT/OT to eval and treat -Social work consult for need of placement  Elevated troponin: Acute on chronic.  Patient denies any complaints of chest pain.  Troponin 0.48 up from 0.06 on 4/25.  EKG not noted to have any significant abnormalities.  Suspect could be secondary to acute demand. -Continue aspirin   Anemia of chronic disease, positive black stool: Hemoglobin 8.8 g/dL which is  higher than discharge 7.3 g/dL, but stool guaiacs noted to be positive.  Patient notes dark stools, but had been on iron prior to being  discharged.  He was typed and screened for the possible need of blood products. -Restart ferrous sulfate  -Recheck hemoglobin and hematocrit this afternoon -Continue to monitor and replace as needed  Metastatic lung cancer: Last CT of the chest on 4/25 noted large centrally necrotic mass concerning for pulmonary abscess with moderate loculated pleural effusion, new 2.8 x 1.8 cm right lower lobe nodule, right adrenal nodules/masses, lytic lesions noted in S1 vertebrae along with manubrium. Patient had previously been on concurrent chemo radiation in February 2019.  -Notified Dr. Julien Nordmann that the patient is admitted to the hospital -Palliative care consulted  Essential hypertension: Blood pressures elevated up to 189/74 on admission. -Restart amlodipine and metoprolol tolerated  Hypothyroidism: TSH last noted to be 13.206 on 4/15. -Restart levothyroxine  History of duodenal ulcer/GERD -Restart Protonix  COPD, without exacerbation -Restart Anoro Ellipta  -DuoNebs as needed for shortness of breath/wheezing  Tobacco abuse: Patient continues to smoke 1 to 2 packs cigarettes per day on average. -Nicotine patch provided  Proteinuria -Check urine protein and creatinine ratio  Prediabetes: Last hemoglobin A1c noted to be 6.2 -Carb/heart healthy modified diet  History of chronic hepatitis C  DVT prophylaxis: SCDs Code Status: DNR Family Communication: No family present at bedside Disposition Plan: Patient likely needs to be discharged to skilled nursing facility Consults called: IR Admission status: Observation  Norval Morton MD [facility] Pager [phone]   If 7PM-7AM, please contact night-coverage [email or web address] Password [email or web address]  12/11/2018, 10:26 AM

## 2018-12-11 NOTE — ED Provider Notes (Signed)
[facility] EMERGENCY DEPARTMENT Provider Note   CSN: [id number] Arrival date & time: 12/11/18  9563    History   Chief Complaint Chief Complaint  Patient presents with   Shortness of Breath    HPI Brad [name] is a 63 y.o. male.     63yo M w/ extensive PMH including stage III squamous cell lung CA, COPD, hep C, PVD, T2DM, carotid stenosis, CVA, CHF who p/w shortness of breath. Pt was discharged from the hospital on 5/3 after admission for SOB. He had diuresis and thoracentesis x2 while in hospital. He states that he initially felt better at time of discharge but his shortness of breath has progressively worsened since he has been at home.  He has not yet gotten any of his medications since discharge.  He denies any associated chest pain, cough, fevers, or vomiting.  He has had some loose stools and states that he has had dark stools over the past week.  He denies any sick contacts.  The history is provided by the patient.  Shortness of Breath    Past Medical History:  Diagnosis Date   Anemia    takes Ferrous Sulfated daily   Arthritis    Carotid stenosis 08/27/2014   takes Aspirin daily   Chronic hepatitis C ([place]) 09/17/2015   COPD (chronic obstructive pulmonary disease) (HCC)    Albuterol daily as needed and Spiriva daily   Depression    Diabetes mellitus type II    takes Metformin and Glipizide daily   Dizziness    GERD (gastroesophageal reflux disease)    GI bleed 12/2015   Headache    occasionally   History of blood clots    in right leg;being followed by Dr.Brabham for this   History of blood transfusion 09/2014   2 units-no abnormal reaction noted   History of gastric ulcer    History of hiatal hernia    Hyperlipidemia 08/27/2014   takes Simvastatin daily   Hypertension    takes Amlodipine and Lisinopril daily   Hypothyroidism 08/27/2014   takes Synthroid daily   Insomnia    takes Pamelor nightly   Joint pain    hips   Muscle spasm    takes Zanaflex daily as needed   MVP (mitral valve prolapse)    Peripheral edema    takes Furosemide daily as needed   Peripheral neuropathy    Pleural effusion 11/2018   Prurigo nodularis    Stroke [facility])     Patient Active Problem List   Diagnosis Date Noted   Pleural effusion associated with pulmonary infection    Lung cancer ([place]) 12/08/2018   Weakness 11/30/2018   Chronic combined systolic and diastolic CHF (congestive heart failure) ([place]) 11/30/2018   AKI (acute kidney injury) ([place]) 11/26/2018   Acute exacerbation of CHF (congestive heart failure) ([place]) 11/21/2018   Encounter for antineoplastic chemotherapy 08/27/2017   Stage III squamous cell carcinoma of left lung ([place]) 06/29/2017   Mediastinal lymphadenopathy    Pleural effusion    Lung mass 04/27/2017   Abnormal chest x-ray 04/13/2017   Loss of weight 05/10/2016   Grief reaction 05/10/2016   Preop exam for internal medicine 05/10/2016   Liver fibrosis 03/09/2016   Smoker 01/23/2016   Peripheral vascular disease ([place]) 01/19/2016   GI bleed 12/08/2015   Acute on chronic anemia 87/56/4332   Diastolic CHF ([place]) 95/18/8416   Hypersomnolence 12/07/2015   Carotid stenosis, symptomatic, with infarction ([place]) 11/30/2015   HLD (hyperlipidemia)  11/12/2015   S/P carotid endarterectomy 11/12/2015   Secondary cardiomyopathy ([place]) 10/14/2015   Acute CVA (cerebrovascular accident) ([place]) 09/28/2015   Stroke (cerebrum) ([place]) 09/27/2015   Facial droop 09/27/2015   Chronic hepatitis C ([place]) 09/17/2015   Skin lesion 09/14/2015   Insect bite, infected 09/14/2015   Left shoulder pain 06/08/2015   Hypoalbuminemia 02/17/2015   Elevated alkaline phosphatase level 02/17/2015   Essential hypertension 02/16/2015   Carotid stenosis 11/13/2014   Atherosclerotic peripheral vascular disease with intermittent claudication (HCC)    Duodenal ulcer disease    Anemia     Tobacco abuse 09/23/2014   Hypothyroidism 08/27/2014   Bilateral carotid artery stenosis    Diabetes ([place]) 05/25/2014   Hypertensive heart disease    History of CVA (cerebrovascular accident) without residual deficits    Obesity (BMI 30-39.9) 01/19/2014   Erectile dysfunction 10/10/2013   Chronic LBP    COPD (chronic obstructive pulmonary disease) ([place]) 03/17/2010   GERD 03/17/2010    Past Surgical History:  Procedure Laterality Date   ENDARTERECTOMY Left 11/13/2014   Procedure: ENDARTERECTOMY CAROTID WITH PATCH ANGIOPLASTY;  Surgeon: Serafina Mitchell, MD;  Location: [facility] OR;  Service: Vascular;  Laterality: Left;   ESOPHAGOGASTRODUODENOSCOPY N/A 09/24/2014   Procedure: ESOPHAGOGASTRODUODENOSCOPY (EGD);  Surgeon: Winfield Cunas., MD;  Location: [facility] ENDOSCOPY;  Service: Endoscopy;  Laterality: N/A;   FUDUCIAL PLACEMENT N/A 06/26/2017   Procedure: PLACEMENT OF FUDUCIAL;  Surgeon: Grace Isaac, MD;  Location: [place];  Service: Thoracic;  Laterality: N/A;   I&D of right arm     INCISION AND DRAINAGE PERIRECTAL ABSCESS  03/2009   IR THORACENTESIS ASP PLEURAL SPACE W/IMG GUIDE  11/21/2018   IR THORACENTESIS ASP PLEURAL SPACE W/IMG GUIDE  12/06/2018   Neg Stress Test  2000   PERIPHERAL VASCULAR CATHETERIZATION N/A 11/30/2015   Procedure: Carotid PTA/Stent Intervention;  Surgeon: Serafina Mitchell, MD;  Location: [place] CV LAB;  Service: Cardiovascular;  Laterality: N/A;   ROTATOR CUFF REPAIR Right 2009   multiple f/u Sxs/infection Tx   VASCULAR SURGERY     VIDEO BRONCHOSCOPY WITH ENDOBRONCHIAL NAVIGATION N/A 05/25/2017   Procedure: VIDEO BRONCHOSCOPY WITH ENDOBRONCHIAL NAVIGATION;  Surgeon: Rigoberto Noel, MD;  Location: [place] OR;  Service: Thoracic;  Laterality: N/A;   VIDEO BRONCHOSCOPY WITH ENDOBRONCHIAL NAVIGATION N/A 06/26/2017   Procedure: VIDEO BRONCHOSCOPY WITH ENDOBRONCHIAL NAVIGATION WITH PLACEMENT OF FIDUCIALS;  Surgeon: Grace Isaac, MD;  Location: [place];  Service: Thoracic;  Laterality: N/A;   VIDEO BRONCHOSCOPY WITH ENDOBRONCHIAL ULTRASOUND N/A 05/25/2017   Procedure: VIDEO BRONCHOSCOPY WITH ENDOBRONCHIAL ULTRASOUND;  Surgeon: Rigoberto Noel, MD;  Location: [place];  Service: Thoracic;  Laterality: N/A;        Home Medications    Prior to Admission medications   Medication Sig Start Date End Date Taking? Authorizing Provider  amLODipine (NORVASC) 5 MG tablet Take 1 tablet (5 mg total) by mouth daily for 30 days. 12/09/18 01/08/19  Marcell Anger, MD  aspirin EC 81 MG tablet Take 1 tablet (81 mg total) by mouth daily. Patient not taking: Reported on 11/25/2018 12/21/15   Biagio Borg, MD  Blood Pressure Monitor DEVI Use to check Blood pressure daily Patient not taking: Reported on 11/25/2018 05/25/14   Biagio Borg, MD  ferrous sulfate 325 (65 FE) MG tablet Take 1 tablet (325 mg total) by mouth 3 (three) times daily with meals. Patient not taking: Reported on 11/25/2018 09/25/14   Elmarie Shiley, MD  furosemide (LASIX) 80 MG tablet Take 1 tablet (80 mg total) by mouth daily for 30 days. Patient not taking: Reported on 11/30/2018 11/27/18 12/27/18  Donne Hazel, MD  glucose blood (ACCU-CHEK AVIVA PLUS) test strip Use to check blood sugars daily Dx E11.9 Patient not taking: Reported on 11/25/2018 11/09/15   Biagio Borg, MD  levothyroxine (SYNTHROID, LEVOTHROID) 112 MCG tablet Take 1 tablet (112 mcg total) daily by mouth. Patient not taking: Reported on 11/25/2018 06/11/17   Biagio Borg, MD  metoprolol succinate (TOPROL-XL) 25 MG 24 hr tablet Take 1 tablet (25 mg total) by mouth daily for 30 days. Patient not taking: Reported on 11/30/2018 11/28/18 12/28/18  Donne Hazel, MD  pantoprazole (PROTONIX) 40 MG tablet Take 1 tablet (40 mg total) by mouth at bedtime for 30 days. Patient not taking: Reported on 11/30/2018 11/27/18 12/27/18  Donne Hazel, MD  umeclidinium-vilanterol [facility] ELLIPTA) 62.5-25 MCG/INH AEPB Inhale 1 puff into  the lungs daily. Patient not taking: Reported on 11/25/2018 05/30/17   Rigoberto Noel, MD    Family History Family History  Problem Relation Age of Onset   Heart disease Mother        automated implantable cardioverter-defibrillator    Diabetes type II Mother    Alcohol abuse Father    Lung cancer Father    Lung cancer Maternal Grandmother    Lung cancer Sister     Social History Social History   Tobacco Use   Smoking status: Current Every Day Smoker    Packs/day: 1.00    Years: 48.00    Pack years: 48.00    Types: Cigarettes   Smokeless tobacco: Never Used   Tobacco comment: 1-2 pks per day  Substance Use Topics   Alcohol use: Not Currently    Alcohol/week: 1.0 standard drinks    Types: 1 Cans of beer per week    Comment: rarely   Drug use: No     Allergies   Doxycycline; Lipitor [atorvastatin]; and Oxycodone   Review of Systems Review of Systems  Respiratory: Positive for shortness of breath.    All other systems reviewed and are negative except that which was mentioned in HPI   Physical Exam Updated Vital Signs BP (!) 189/74 (BP Location: Right Arm)    Pulse 78    Temp 97.9 F (36.6 C) (Oral)    Resp (!) 32    SpO2 96%   Physical Exam Vitals signs and nursing note reviewed.  Constitutional:      General: He is not in acute distress.    Appearance: He is well-developed.     Comments: Chronically ill appearing  HENT:     Head: Normocephalic and atraumatic.  Eyes:     Conjunctiva/sclera: Conjunctivae normal.  Neck:     Musculoskeletal: Neck supple.  Cardiovascular:     Rate and Rhythm: Normal rate and regular rhythm.     Heart sounds: Normal heart sounds. No murmur.  Pulmonary:     Effort: Tachypnea present. No respiratory distress.     Breath sounds: Examination of the left-lower field reveals decreased breath sounds. Decreased breath sounds present.     Comments: Tachypnea with mildly increased WOB, no respiratory distress; diminished  BS LLL Abdominal:     General: Bowel sounds are normal. There is no distension.     Palpations: Abdomen is soft.     Tenderness: There is no abdominal tenderness.  Genitourinary:    Comments: Candidal rash in groin Musculoskeletal:  Right lower leg: Edema present.     Left lower leg: Edema present.     Comments: Pitting edema BLE L>R; ankle monitor R leg  Skin:    General: Skin is warm and dry.  Neurological:     Mental Status: He is alert and oriented to person, place, and time.     Comments: Fluent speech  Psychiatric:        Judgment: Judgment normal.   Chaperone was present during exam.    ED Treatments / Results  Labs (all labs ordered are listed, but only abnormal results are displayed) Labs Reviewed  COMPREHENSIVE METABOLIC PANEL - Abnormal; Notable for the following components:      Result Value   Sodium 133 (*)    Glucose, Bld 131 (*)    BUN 28 (*)    Calcium 8.2 (*)    Albumin 1.5 (*)    Alkaline Phosphatase 220 (*)    All other components within normal limits  CBC WITH DIFFERENTIAL/PLATELET - Abnormal; Notable for the following components:   WBC 12.5 (*)    RBC 3.49 (*)    Hemoglobin 8.8 (*)    HCT 28.7 (*)    MCH 25.2 (*)    RDW 18.7 (*)    Neutro Abs 11.2 (*)    Lymphs Abs 0.4 (*)    All other components within normal limits  TROPONIN I - Abnormal; Notable for the following components:   Troponin I 0.48 (*)    All other components within normal limits  BRAIN NATRIURETIC PEPTIDE - Abnormal; Notable for the following components:   B Natriuretic Peptide 1,993.5 (*)    All other components within normal limits  URINALYSIS, ROUTINE W REFLEX MICROSCOPIC - Abnormal; Notable for the following components:   Glucose, UA 50 (*)    Hgb urine dipstick SMALL (*)    Protein, ur >=300 (*)    All other components within normal limits  PROTIME-INR - Abnormal; Notable for the following components:   Prothrombin Time 18.0 (*)    INR 1.5 (*)    All other  components within normal limits  POC OCCULT BLOOD, ED - Abnormal; Notable for the following components:   Fecal Occult Bld POSITIVE (*)    All other components within normal limits  SARS CORONAVIRUS 2 (HOSPITAL ORDER, [place] LAB)  TYPE AND SCREEN    EKG EKG Interpretation  Date/Time:  Wednesday Dec 11 2018 08:03:15 EDT Ventricular Rate:  82 PR Interval:    QRS Duration: 123 QT Interval:  382 QTC Calculation: 447 R Axis:   -25 Text Interpretation:  Sinus rhythm LVH with secondary repolarization abnormality T waves slightly more peaked but otherwise similar to previous Confirmed by Theotis Burrow [id number]) on 12/11/2018 8:16:09 AM   Radiology Dg Chest 2 View  Result Date: 12/11/2018 CLINICAL DATA:  Ongoing shortness of breath.  Recent thoracentesis EXAM: CHEST - 2 VIEW COMPARISON:  Five days ago FINDINGS: Extensive opacification and volume loss on the left. The right chest is comparatively clear with improved interstitial opacity since prior. Normal heart size which is displaced to the left. IMPRESSION: Unchanged extensive opacification and volume loss on the left where there is a necrotic lung mass. Electronically Signed   By: Monte Fantasia M.D.   On: 12/11/2018 08:50    Procedures .Critical Care Performed by: Sharlett Iles, MD Authorized by: Sharlett Iles, MD   Critical care provider statement:    Critical care time (minutes):  30  Critical care time was exclusive of:  Separately billable procedures and treating other patients   Critical care was necessary to treat or prevent imminent or life-threatening deterioration of the following conditions:  Cardiac failure   Critical care was time spent personally by me on the following activities:  Development of treatment plan with patient or surrogate, evaluation of patient's response to treatment, examination of patient, obtaining history from patient or surrogate, ordering and performing  treatments and interventions, ordering and review of laboratory studies, ordering and review of radiographic studies, re-evaluation of patient's condition and review of old charts   (including critical care time)  Medications Ordered in ED Medications  aspirin chewable tablet 324 mg (324 mg Oral Given 12/11/18 1021)  furosemide (LASIX) injection 40 mg (40 mg Intravenous Given 12/11/18 1021)     Initial Impression / Assessment and Plan / ED Course  I have reviewed the triage vital signs and the nursing notes.  Pertinent labs & imaging results that were available during my care of the patient were reviewed by me and considered in my medical decision making (see chart for details).        Chronically ill-appearing on exam, hypertensive, O2 saturation mid 90s on room air, tachypnea without respiratory distress.  No complaints of chest pain.  EKG without acute ischemic changes.  Chest x-ray redemonstrates dense opacification of left lung.  Patient was Hemoccult positive however hemoglobin is stable at 8.8.  Troponin elevated at 0.48, BNP 1993, INR 1.5, normal creatinine.  I suspect elevated troponin is due to demand ischemia in the setting of volume overload from medication noncompliance rather than ACS, therefore gave Lasix and will hold off on heparin especially given his bloody stool.  Discussed admission with [facility] hospitalist, Dr. Tamala Julian, and patient admitted for further treatment.  Final Clinical Impressions(s) / ED Diagnoses   Final diagnoses:  Acute on chronic combined systolic and diastolic congestive heart failure (HCC)  Elevated troponin level not due to acute coronary syndrome    ED Discharge Orders    None       Perrion Diesel, Wenda Overland, MD 12/11/18 1046

## 2018-12-11 NOTE — ED Triage Notes (Signed)
Per [facility]: Pt coming in for shortness of breath. Pt recently diagnosed with lung cancer, also has hx of CHF. Pt with increased malaise. Pt with incontinent episode of dark colored stool with EMS.

## 2018-12-11 NOTE — Consult Note (Signed)
Consultation Note Date: 12/11/2018   Patient Name: Brad [name]  DOB: 01-30-1956  MRN: [id number]  Age / Sex: 63 y.o., male  PCP: Biagio Borg, MD Referring Physician: Norval Morton, MD  Reason for Consultation: Establishing goals of care and Psychosocial/spiritual support  HPI/Patient Profile: 63 y.o. male   admitted on 12/11/2018 with past medical history significant of COPD, Stage III Squamous Cell Cancer of the left upper lobe (s/p radiation and incomplete chemotherapy), CHF (EF 55-60%, G2DD by TTE 09/2015), T2DM, HTN, PVD, Chronic Hepatitis C s/p Harvoni, Anemia, Hx of CVA, Hx ofDuodenalUlcers, and Hx or RLE DVT no longer on anticoagulation;admitted thru the ED with complaints of weakness and shortness of breath.    Patient just recently discharged after being hospitalized from 4/24-5/3, for CHF exacerbation and weakness. Patient reports that he no longer has a place to live.  Dense psycho-social issues.  Once again patient faces treatment option decisions, advanced directive decisions, discharge position issues and anticipatory care needs.    Clinical Assessment and Goals of Care:  This NP Wadie Lessen reviewed medical records, received report from team, assessed the patient and then meet at the patient's bedside  to discuss diagnosis, prognosis, GOC, EOL wishes disposition and options.  Concept of Palliative Care were reintroduced  A  discussion was had today regarding advanced directives.  Concepts specific to code status, artifical feeding and hydration, continued IV antibiotics and rehospitalization was had.  The difference between a aggressive medical intervention path  and a palliative comfort care path for this patient at this time was had.  Values and goals of care important to patient and family were attempted to be elicited.  We discussed  current illness within the  context of multiple   co-morbidities and the seriousness of his disease. Today the patient tells me that he is willing and open to oncologic evaluation and treatment if he is able to secure housing  I did discuss with him his past decisions regarding follow-up and self responsibility in his healthcare.  Unfortunately his dense psychosocial issues/conditions present him with heavy hurdles and burdens   Questions and concerns addressed.   Family encouraged to call with questions or concerns.    PMT will continue to support holistically.    He tells me he has no documented healthcare power of attorney and no reliable family to speak for him in the event that he cannot speak for himself.  He feels that the emergency contact listed, Diane Purdue, a friend would be there for him if he needed her.  SUMMARY OF RECOMMENDATIONS    Code Status/Advance Care Planning:  DNR     Palliative Prophylaxis:   Aspiration, Bowel Regimen, Delirium Protocol, Frequent Pain Assessment and Oral Care  Additional Recommendations (Limitations, Scope, Preferences):  Full Scope Treatment  Psycho-social/Spiritual:   Desire for further Chaplaincy support:yes  Additional Recommendations: Education on Hospice  Prognosis:   < 6 months  Discharge Planning: To Be Determined      Primary Diagnoses: Present on Admission: .  SOB (shortness of breath) . GERD . Essential hypertension . Lung cancer ([place]) . Hypothyroidism . Tobacco abuse . Elevated troponin . Pleural effusion   I have reviewed the medical record, interviewed the patient and family, and examined the patient. The following aspects are pertinent.  Past Medical History:  Diagnosis Date  . Anemia    takes Ferrous Sulfated daily  . Arthritis   . Carotid stenosis 08/27/2014   takes Aspirin daily  . Chronic hepatitis C ([place]) 09/17/2015  . COPD (chronic obstructive pulmonary disease) (HCC)    Albuterol daily as needed and Spiriva daily  . Depression   .  Diabetes mellitus type II    takes Metformin and Glipizide daily  . Dizziness   . GERD (gastroesophageal reflux disease)   . GI bleed 12/2015  . Headache    occasionally  . History of blood clots    in right leg;being followed by Dr.Brabham for this  . History of blood transfusion 09/2014   2 units-no abnormal reaction noted  . History of gastric ulcer   . History of hiatal hernia   . Hyperlipidemia 08/27/2014   takes Simvastatin daily  . Hypertension    takes Amlodipine and Lisinopril daily  . Hypothyroidism 08/27/2014   takes Synthroid daily  . Insomnia    takes Pamelor nightly  . Joint pain    hips  . Muscle spasm    takes Zanaflex daily as needed  . MVP (mitral valve prolapse)   . Peripheral edema    takes Furosemide daily as needed  . Peripheral neuropathy   . Pleural effusion 11/2018  . Prurigo nodularis   . Stroke [facility])    Social History   Socioeconomic History  . Marital status: Single    Spouse name: Not on file  . Number of children: 0  . Years of education: Not on file  . Highest education level: Not on file  Occupational History  . Occupation: Disabled  Social Needs  . Financial resource strain: Not on file  . Food insecurity:    Worry: Not on file    Inability: Not on file  . Transportation needs:    Medical: Not on file    Non-medical: Not on file  Tobacco Use  . Smoking status: Current Every Day Smoker    Packs/day: 1.00    Years: 48.00    Pack years: 48.00    Types: Cigarettes  . Smokeless tobacco: Never Used  . Tobacco comment: 1-2 pks per day  Substance and Sexual Activity  . Alcohol use: Not Currently    Alcohol/week: 1.0 standard drinks    Types: 1 Cans of beer per week    Comment: rarely  . Drug use: No  . Sexual activity: Yes  Lifestyle  . Physical activity:    Days per week: Not on file    Minutes per session: Not on file  . Stress: Not on file  Relationships  . Social connections:    Talks on phone: Not on file    Gets  together: Not on file    Attends religious service: Not on file    Active member of club or organization: Not on file    Attends meetings of clubs or organizations: Not on file    Relationship status: Not on file  Other Topics Concern  . Not on file  Social History Narrative   Divorced with fiance.   Disabled since June 2010 - right shoulder   No children.  Family History  Problem Relation Age of Onset  . Heart disease Mother        automated implantable cardioverter-defibrillator   . Diabetes type II Mother   . Alcohol abuse Father   . Lung cancer Father   . Lung cancer Maternal Grandmother   . Lung cancer Sister    Scheduled Meds: . amLODipine  5 mg Oral Daily  . [START ON 12/12/2018] aspirin EC  81 mg Oral Daily  . ferrous sulfate  325 mg Oral TID WC  . furosemide  40 mg Intravenous BID  . levothyroxine  112 mcg Oral Daily  . metoprolol succinate  25 mg Oral Daily  . nicotine  21 mg Transdermal Daily  . pantoprazole  40 mg Oral QHS  . potassium chloride  40 mEq Oral Daily  . sodium chloride flush  3 mL Intravenous Q12H  . umeclidinium-vilanterol  1 puff Inhalation Daily   Continuous Infusions: . sodium chloride     PRN Meds:.sodium chloride, acetaminophen, ipratropium-albuterol, ondansetron (ZOFRAN) IV, sodium chloride flush Medications Prior to Admission:  Prior to Admission medications   Medication Sig Start Date End Date Taking? Authorizing Provider  amLODipine (NORVASC) 5 MG tablet Take 1 tablet (5 mg total) by mouth daily for 30 days. 12/09/18 01/08/19 Yes Spongberg, Audie Pinto, MD  aspirin EC 81 MG tablet Take 1 tablet (81 mg total) by mouth daily. Patient not taking: Reported on 11/25/2018 12/21/15   Biagio Borg, MD  Blood Pressure Monitor DEVI Use to check Blood pressure daily Patient not taking: Reported on 11/25/2018 05/25/14   Biagio Borg, MD  ferrous sulfate 325 (65 FE) MG tablet Take 1 tablet (325 mg total) by mouth 3 (three) times daily with  meals. Patient not taking: Reported on 11/25/2018 09/25/14   Regalado, Jerald Kief A, MD  furosemide (LASIX) 80 MG tablet Take 1 tablet (80 mg total) by mouth daily for 30 days. Patient not taking: Reported on 11/30/2018 11/27/18 12/27/18  Donne Hazel, MD  glucose blood (ACCU-CHEK AVIVA PLUS) test strip Use to check blood sugars daily Dx E11.9 Patient not taking: Reported on 11/25/2018 11/09/15   Biagio Borg, MD  levothyroxine (SYNTHROID, LEVOTHROID) 112 MCG tablet Take 1 tablet (112 mcg total) daily by mouth. Patient not taking: Reported on 11/25/2018 06/11/17   Biagio Borg, MD  metoprolol succinate (TOPROL-XL) 25 MG 24 hr tablet Take 1 tablet (25 mg total) by mouth daily for 30 days. Patient not taking: Reported on 11/30/2018 11/28/18 12/28/18  Donne Hazel, MD  pantoprazole (PROTONIX) 40 MG tablet Take 1 tablet (40 mg total) by mouth at bedtime for 30 days. Patient not taking: Reported on 11/30/2018 11/27/18 12/27/18  Donne Hazel, MD  umeclidinium-vilanterol [facility] ELLIPTA) 62.5-25 MCG/INH AEPB Inhale 1 puff into the lungs daily. Patient not taking: Reported on 11/25/2018 05/30/17   Rigoberto Noel, MD   Allergies  Allergen Reactions  . Doxycycline Hives and Itching  . Lipitor [Atorvastatin] Other (See Comments)    Myalgia   . Oxycodone Itching   Review of Systems  Constitutional: Positive for fatigue.  Neurological: Positive for weakness.    Physical Exam Constitutional:      Appearance: He is underweight. He is ill-appearing.     Interventions: Nasal cannula in place.  Cardiovascular:     Rate and Rhythm: Normal rate and regular rhythm.     Heart sounds: Normal heart sounds.  Skin:    General: Skin is warm and dry.  Neurological:  Mental Status: He is alert.     Vital Signs: BP (!) 164/64 (BP Location: Right Arm)   Pulse 61   Temp 98.1 F (36.7 C) (Oral)   Resp 19   SpO2 99%  Pain Scale: 0-10   Pain Score: 0-No pain   SpO2: SpO2: 99 % O2 Device:SpO2: 99 % O2  Flow Rate: .   IO: Intake/output summary:   Intake/Output Summary (Last 24 hours) at 12/11/2018 1348 Last data filed at 12/11/2018 1300 Gross per 24 hour  Intake -  Output 1800 ml  Net -1800 ml    LBM:   Baseline Weight:   Most recent weight:       Palliative Assessment/Data:    Discussed with CMRN  Time In: 1550 Time Out: 1700 Time Total: 70 minutes Greater than 50%  of this time was spent counseling and coordinating care related to the above assessment and plan.  Signed by: Wadie Lessen, NP   Please contact Palliative Medicine Team phone at [phone] for questions and concerns.  For individual provider: See Shea Evans

## 2018-12-11 NOTE — ED Notes (Signed)
Attempted to give report to [place], pt not appropriate for floor.

## 2018-12-11 NOTE — Progress Notes (Signed)
PT Cancellation Note  Patient Details Name: Brad [name] MRN: [id number] DOB: August 02, 1956   Cancelled Treatment:    Reason Eval/Treat Not Completed: Patient at procedure or test/unavailable - Pt at thoracentesis procedure at this time. PT to check back as schedule allows.  Julien Girt, PT Acute Rehabilitation Services Pager [phone]  Office [place] 12/11/2018, 2:16 PM

## 2018-12-11 NOTE — TOC Initial Note (Addendum)
Transition of Care [facility]) - Initial/Assessment Note    Patient Details  Name: Brad [name] MRN: [id number] Date of Birth: 1956/04/10  Transition of Care [facility] CM/SW Contact:    Sherrilyn Rist Phone Number: [phone] 12/11/2018, 1:06 PM  Clinical Narrative:                 12/11/2018 - CM following for progression of care; recently dc home / [facility] [place]) 12/09/2018; Mindi Slicker RN,MHA,BSN   12/09/2018 - Clinical Narrative:  CM did contact the [facility] Agency to make her aware that patient will transition to shelter and not the home address. Pt will go to [facility] post hospitalization and they will work to get him into a local shelter. Cheryl with [facility] to contact the patient in the room to stay connected. Pt continues to be on House Arrest. CM did contact [facility] to make them aware as well. No further needs from CM at this time.  12-09-18 1444 Patient refused to take RW with him- Cab voucher provided to [facility] further needs from CM at this time. B K Graves -[place] RN,CM  12/09/2018 - Clinical Narrative:  CM did contact the [facility] Agency to make her aware that patient will transition to shelter and not the home address. Pt will go to [facility] post hospitalization and they will work to get him into a local shelter. Cheryl with [facility] to contact the patient in the room to stay connected. Pt continues to be on House Arrest. CM did contact [facility] to make them aware as well. No further needs from CM at this time. Claudius Sis RN CM  12-09-18 1444 Patient refused to take RW with him- Cab voucher provided to [facility] further needs from CM at this time.   12/09/2018 - Patient is currently declining [place] assistance. Patient is declining going to a shelter or boarding house. The patient stated that he can afford to stay in a hotel for a few nights. Patient is agreeable and wanting to go to the [facility] for assistance with services.  Patient is wanting to discharge this afternoon.  Patient stated that he is able to get his belongings. CSW spoke with the patient about finding out who his PO officer is and letting them know about his change in circumstances.  Patient is requesting to leave by cab to the [facility]. CSW will facilitate discharge and provide a cab voucher.C Pinnion SW  12/01/2018- Clinical Narrative:      Pt admitted as inpatient last week.  [facility] and then to [facility]. Pt lives alone and states was too weak to perform ADLs at home, so returned to the ED.  Pt has a friend that provides transportation and helps, but does not provide care.  Pt's family lives in other states.  Pt states "I may need to make a change if I can't take care of myself".   Pt has no DME at home and states prior to initial hospitalization was independent with ADLs.     Discussed d/c plan with patient.  He is open to SNF but will be ineligible due to being a sex offender (incarceration in 2014).  Home health agencies will also not provide care to patients who have recent criminal charges.  Will await further disposition as palliative consult may be appropriate. Clayborn Bigness RN,CM    Expected Discharge Plan: Home w Home Health Services Barriers to Discharge: No Barriers Identified   Patient Goals and CMS Choice  Patient states their goals for this hospitalization and ongoing recovery are:: to go home CMS Medicare.gov Compare Post Acute Care list provided to:: Patient    Expected Discharge Plan and Services Expected Discharge Plan: [place] In-house Referral: Clinical Social Work Discharge Planning Services: CM Consult   Living arrangements for the past 2 months: Single Family Home                 DME Arranged: N/A         HH Arranged: RN, Disease Management, PT, Social Work [employer] Agency: Horticulturist, commercial        Prior Living Arrangements/Services Living arrangements for the past 2 months: [place] Lives with:: Self   Do you feel  safe going back to the place where you live?: Yes      Need for Family Participation in Patient Care: No (Comment) Care giver support system in place?: Yes (comment)   Criminal Activity/Legal Involvement Pertinent to Current Situation/Hospitalization: Yes - Comment as needed  Activities of Daily Living      Permission Sought/Granted Permission sought to share information with : Case Manager                Emotional Assessment              Admission diagnosis:  Pleural effusion [J90] Acute on chronic combined systolic and diastolic congestive heart failure (HCC) [I50.43] Elevated troponin level not due to acute coronary syndrome [R79.89] Patient Active Problem List   Diagnosis Date Noted  . SOB (shortness of breath) 12/11/2018  . Elevated troponin 12/11/2018  . Pleural effusion associated with pulmonary infection   . Lung cancer ([place]) 12/08/2018  . Weakness 11/30/2018  . Chronic combined systolic and diastolic CHF (congestive heart failure) ([place]) 11/30/2018  . AKI (acute kidney injury) ([place]) 11/26/2018  . Acute exacerbation of CHF (congestive heart failure) ([place]) 11/21/2018  . Encounter for antineoplastic chemotherapy 08/27/2017  . Stage III squamous cell carcinoma of left lung ([place]) 06/29/2017  . Mediastinal lymphadenopathy   . Pleural effusion   . Lung mass 04/27/2017  . Abnormal chest x-ray 04/13/2017  . Loss of weight 05/10/2016  . Grief reaction 05/10/2016  . Preop exam for internal medicine 05/10/2016  . Liver fibrosis 03/09/2016  . Smoker 01/23/2016  . Peripheral vascular disease ([place]) 01/19/2016  . GI bleed 12/08/2015  . Anemia of chronic disease 12/08/2015  . Diastolic CHF ([place]) 18/29/9371  . Hypersomnolence 12/07/2015  . Carotid stenosis, symptomatic, with infarction ([place]) 11/30/2015  . HLD (hyperlipidemia) 11/12/2015  . S/P carotid endarterectomy 11/12/2015  . Secondary cardiomyopathy ([place]) 10/14/2015  . Acute CVA (cerebrovascular accident) ([place])  09/28/2015  . Stroke (cerebrum) ([place]) 09/27/2015  . Facial droop 09/27/2015  . Chronic hepatitis C ([place]) 09/17/2015  . Skin lesion 09/14/2015  . Insect bite, infected 09/14/2015  . Left shoulder pain 06/08/2015  . Hypoalbuminemia 02/17/2015  . Elevated alkaline phosphatase level 02/17/2015  . Essential hypertension 02/16/2015  . Carotid stenosis 11/13/2014  . Atherosclerotic peripheral vascular disease with intermittent claudication ([place])   . Duodenal ulcer disease   . Anemia   . Tobacco abuse 09/23/2014  . Hypothyroidism 08/27/2014  . Bilateral carotid artery stenosis   . Diabetes ([place]) 05/25/2014  . Hypertensive heart disease   . History of CVA (cerebrovascular accident) without residual deficits   . Obesity (BMI 30-39.9) 01/19/2014  . Erectile dysfunction 10/10/2013  . Chronic LBP   . COPD (chronic obstructive pulmonary disease) ([place])  03/17/2010  . GERD 03/17/2010   PCP:  Biagio Borg, MD Pharmacy:   [facility], [place] - [address] AT [place] [address] Phone: [phone] Fax: [place], [place] - [address] [facility]. [address] [place] [address] Phone: [phone] Fax: [place], [place] - [address] [place] [address] Phone: [phone] Fax: [phone]     Social Determinants of Health (SDOH) Interventions    Readmission Risk Interventions Readmission Risk Prevention Plan 12/06/2018 12/04/2018  Transportation Screening - Complete  HRI or [place] - Complete  Social Work Consult for [place] Planning/Counseling Complete -  Palliative Care Screening - Complete  Medication Review Press photographer) - Complete  Some recent data might be hidden

## 2018-12-11 NOTE — ED Notes (Signed)
Patient transported to X-ray 

## 2018-12-11 NOTE — TOC Initial Note (Signed)
Transition of Care [facility]) - Initial/Assessment Note    Patient Details  Name: Brad [name] MRN: [id number] Date of Birth: 1955-12-27  Transition of Care [facility] CM/SW Contact:    Bethena Roys, RN Phone Number: 12/11/2018, 3:24 PM  Clinical Narrative: Pt presented for limited mobility, weakness and shortness of breath. Patient recently transitioned to community from unit [address] [place]. Patient was provided cab voucher to the [facility] building and from there he was supposed to go to a shelter. Previous visit this CM set patient up with [facility]. Pt is a registered sex-offender on house arrest-[place] [facility] has the Games developer for house arrest ankle bracelet. CM has called several group homes awaiting to hear back from them to see if patient will be a candidate secondary to history. CM will continue to monitor for additional transition of care needs.                    Expected Discharge Plan: [place] Barriers to Discharge: No Barriers Identified   Patient Goals and CMS Choice Patient states their goals for this hospitalization and ongoing recovery are:: to go home CMS Medicare.gov Compare Post Acute Care list provided to:: Patient    Expected Discharge Plan and Services Expected Discharge Plan: [place] In-house Referral: Clinical Social Work Discharge Planning Services: CM Consult   Living arrangements for the past 2 months: (Pt was staying on the front porch of his brother-n-laws home- (homeless) )                 DME Arranged: N/A         HH Arranged: RN, Disease Management, PT, Social Work [employer] Agency: [place] Date [place]: 12/11/18 Time [place]: 63 Representative spoke with at [place]: Malachy Mood  Prior Living Arrangements/Services Living arrangements for the past 2 months: (Pt was staying on the front porch of his brother-n-laws home- (homeless) ) Lives with:: Self   Do you feel  safe going back to the place where you live?: Yes      Need for Family Participation in Patient Care: No (Comment) Care giver support system in place?: Yes (comment) Current home services: Home PT, Home RN(Social Worker via [facility]-unable to connect with patient) Criminal Activity/Legal Involvement Pertinent to Current Situation/Hospitalization: Yes - Comment as needed  Activities of Daily Living      Permission Sought/Granted Permission sought to share information with : Case Manager                Emotional Assessment Appearance:: Appears older than stated age Attitude/Demeanor/Rapport: Engaged Affect (typically observed): Accepting Orientation: : Oriented to Self, Oriented to Situation, Oriented to Place, Oriented to  Time Alcohol / Substance Use: Not Applicable Psych Involvement: No (comment)  Admission diagnosis:  Pleural effusion [J90] Acute on chronic combined systolic and diastolic congestive heart failure (HCC) [I50.43] Elevated troponin level not due to acute coronary syndrome [R79.89] Patient Active Problem List   Diagnosis Date Noted  . SOB (shortness of breath) 12/11/2018  . Elevated troponin 12/11/2018  . Pleural effusion associated with pulmonary infection   . Lung cancer ([place]) 12/08/2018  . Weakness 11/30/2018  . Chronic combined systolic and diastolic CHF (congestive heart failure) ([place]) 11/30/2018  . AKI (acute kidney injury) ([place]) 11/26/2018  . Acute exacerbation of CHF (congestive heart failure) ([place]) 11/21/2018  . Encounter for antineoplastic chemotherapy 08/27/2017  . Stage III squamous cell carcinoma of left lung ([place])  06/29/2017  . Mediastinal lymphadenopathy   . Pleural effusion   . Lung mass 04/27/2017  . Abnormal chest x-ray 04/13/2017  . Loss of weight 05/10/2016  . Grief reaction 05/10/2016  . Preop exam for internal medicine 05/10/2016  . Liver fibrosis 03/09/2016  . Smoker 01/23/2016  . Peripheral vascular disease ([place]) 01/19/2016  .  GI bleed 12/08/2015  . Anemia of chronic disease 12/08/2015  . Diastolic CHF ([place]) 78/58/8502  . Hypersomnolence 12/07/2015  . Carotid stenosis, symptomatic, with infarction ([place]) 11/30/2015  . HLD (hyperlipidemia) 11/12/2015  . S/P carotid endarterectomy 11/12/2015  . Secondary cardiomyopathy ([place]) 10/14/2015  . Acute CVA (cerebrovascular accident) ([place]) 09/28/2015  . Stroke (cerebrum) ([place]) 09/27/2015  . Facial droop 09/27/2015  . Chronic hepatitis C ([place]) 09/17/2015  . Skin lesion 09/14/2015  . Insect bite, infected 09/14/2015  . Left shoulder pain 06/08/2015  . Hypoalbuminemia 02/17/2015  . Elevated alkaline phosphatase level 02/17/2015  . Essential hypertension 02/16/2015  . Carotid stenosis 11/13/2014  . Atherosclerotic peripheral vascular disease with intermittent claudication ([place])   . Duodenal ulcer disease   . Anemia   . Tobacco abuse 09/23/2014  . Hypothyroidism 08/27/2014  . Bilateral carotid artery stenosis   . Diabetes ([place]) 05/25/2014  . Hypertensive heart disease   . History of CVA (cerebrovascular accident) without residual deficits   . Obesity (BMI 30-39.9) 01/19/2014  . Erectile dysfunction 10/10/2013  . Chronic LBP   . COPD (chronic obstructive pulmonary disease) ([place]) 03/17/2010  . GERD 03/17/2010   PCP:  Biagio Borg, MD Pharmacy:   [place], [place] - [address] AT [place] [address] Phone: [phone] Fax: [place], [place] - [address] [facility]. [address] [place] [address] Phone: [phone] Fax: [place], [place] - [address] [place] [address] Phone: [phone] Fax: [phone]     Social Determinants of Health (SDOH) Interventions    Readmission Risk Interventions Readmission Risk Prevention Plan  12/06/2018 12/04/2018  Transportation Screening - Complete  HRI or [place] - Complete  Social Work Consult for [place] Planning/Counseling Complete -  Palliative Care Screening - Complete  Medication Review Press photographer) - Complete  Some recent data might be hidden

## 2018-12-11 NOTE — Clinical Social Work Note (Signed)
CSW acknowledges SNF consult. We cannot send patient to SNF due to him being a sex offender and on house arrest. MD is aware.  Dayton Scrape, [place]

## 2018-12-11 NOTE — ED Notes (Signed)
Dr. Rex Kras aware of pts troponin.

## 2018-12-12 ENCOUNTER — Inpatient Hospital Stay (HOSPITAL_COMMUNITY): Payer: Medicare Other

## 2018-12-12 ENCOUNTER — Inpatient Hospital Stay: Payer: Medicare Other | Admitting: Internal Medicine

## 2018-12-12 ENCOUNTER — Other Ambulatory Visit: Payer: Self-pay | Admitting: *Deleted

## 2018-12-12 ENCOUNTER — Inpatient Hospital Stay: Payer: Medicare Other

## 2018-12-12 DIAGNOSIS — I5023 Acute on chronic systolic (congestive) heart failure: Secondary | ICD-10-CM | POA: Diagnosis not present

## 2018-12-12 DIAGNOSIS — E038 Other specified hypothyroidism: Secondary | ICD-10-CM

## 2018-12-12 DIAGNOSIS — R7989 Other specified abnormal findings of blood chemistry: Secondary | ICD-10-CM | POA: Diagnosis not present

## 2018-12-12 DIAGNOSIS — Z515 Encounter for palliative care: Secondary | ICD-10-CM

## 2018-12-12 DIAGNOSIS — G4700 Insomnia, unspecified: Secondary | ICD-10-CM | POA: Diagnosis present

## 2018-12-12 DIAGNOSIS — I5043 Acute on chronic combined systolic (congestive) and diastolic (congestive) heart failure: Secondary | ICD-10-CM | POA: Diagnosis not present

## 2018-12-12 DIAGNOSIS — C3492 Malignant neoplasm of unspecified part of left bronchus or lung: Secondary | ICD-10-CM | POA: Diagnosis not present

## 2018-12-12 DIAGNOSIS — E039 Hypothyroidism, unspecified: Secondary | ICD-10-CM | POA: Diagnosis present

## 2018-12-12 DIAGNOSIS — Z6826 Body mass index (BMI) 26.0-26.9, adult: Secondary | ICD-10-CM | POA: Diagnosis not present

## 2018-12-12 DIAGNOSIS — R627 Adult failure to thrive: Secondary | ICD-10-CM | POA: Diagnosis present

## 2018-12-12 DIAGNOSIS — Z66 Do not resuscitate: Secondary | ICD-10-CM | POA: Diagnosis present

## 2018-12-12 DIAGNOSIS — J449 Chronic obstructive pulmonary disease, unspecified: Secondary | ICD-10-CM | POA: Diagnosis present

## 2018-12-12 DIAGNOSIS — H53129 Transient visual loss, unspecified eye: Secondary | ICD-10-CM | POA: Diagnosis not present

## 2018-12-12 DIAGNOSIS — K210 Gastro-esophageal reflux disease with esophagitis: Secondary | ICD-10-CM

## 2018-12-12 DIAGNOSIS — Z85118 Personal history of other malignant neoplasm of bronchus and lung: Secondary | ICD-10-CM | POA: Diagnosis not present

## 2018-12-12 DIAGNOSIS — R809 Proteinuria, unspecified: Secondary | ICD-10-CM | POA: Diagnosis present

## 2018-12-12 DIAGNOSIS — J918 Pleural effusion in other conditions classified elsewhere: Secondary | ICD-10-CM | POA: Diagnosis present

## 2018-12-12 DIAGNOSIS — E1151 Type 2 diabetes mellitus with diabetic peripheral angiopathy without gangrene: Secondary | ICD-10-CM | POA: Diagnosis present

## 2018-12-12 DIAGNOSIS — I6782 Cerebral ischemia: Secondary | ICD-10-CM | POA: Diagnosis not present

## 2018-12-12 DIAGNOSIS — I214 Non-ST elevation (NSTEMI) myocardial infarction: Secondary | ICD-10-CM | POA: Diagnosis not present

## 2018-12-12 DIAGNOSIS — I341 Nonrheumatic mitral (valve) prolapse: Secondary | ICD-10-CM | POA: Diagnosis present

## 2018-12-12 DIAGNOSIS — Z8711 Personal history of peptic ulcer disease: Secondary | ICD-10-CM | POA: Diagnosis not present

## 2018-12-12 DIAGNOSIS — H538 Other visual disturbances: Secondary | ICD-10-CM | POA: Diagnosis present

## 2018-12-12 DIAGNOSIS — Z590 Homelessness: Secondary | ICD-10-CM | POA: Diagnosis not present

## 2018-12-12 DIAGNOSIS — I110 Hypertensive heart disease with heart failure: Secondary | ICD-10-CM | POA: Diagnosis present

## 2018-12-12 DIAGNOSIS — D638 Anemia in other chronic diseases classified elsewhere: Secondary | ICD-10-CM | POA: Diagnosis present

## 2018-12-12 DIAGNOSIS — I10 Essential (primary) hypertension: Secondary | ICD-10-CM | POA: Diagnosis not present

## 2018-12-12 DIAGNOSIS — R0602 Shortness of breath: Secondary | ICD-10-CM | POA: Diagnosis not present

## 2018-12-12 DIAGNOSIS — C7971 Secondary malignant neoplasm of right adrenal gland: Secondary | ICD-10-CM | POA: Diagnosis present

## 2018-12-12 DIAGNOSIS — E785 Hyperlipidemia, unspecified: Secondary | ICD-10-CM | POA: Diagnosis present

## 2018-12-12 DIAGNOSIS — C3490 Malignant neoplasm of unspecified part of unspecified bronchus or lung: Secondary | ICD-10-CM | POA: Diagnosis present

## 2018-12-12 DIAGNOSIS — I248 Other forms of acute ischemic heart disease: Secondary | ICD-10-CM | POA: Diagnosis present

## 2018-12-12 DIAGNOSIS — Z20828 Contact with and (suspected) exposure to other viral communicable diseases: Secondary | ICD-10-CM | POA: Diagnosis present

## 2018-12-12 DIAGNOSIS — R911 Solitary pulmonary nodule: Secondary | ICD-10-CM | POA: Diagnosis not present

## 2018-12-12 DIAGNOSIS — K219 Gastro-esophageal reflux disease without esophagitis: Secondary | ICD-10-CM | POA: Diagnosis present

## 2018-12-12 DIAGNOSIS — J90 Pleural effusion, not elsewhere classified: Secondary | ICD-10-CM | POA: Diagnosis not present

## 2018-12-12 DIAGNOSIS — I2510 Atherosclerotic heart disease of native coronary artery without angina pectoris: Secondary | ICD-10-CM | POA: Diagnosis present

## 2018-12-12 DIAGNOSIS — F17210 Nicotine dependence, cigarettes, uncomplicated: Secondary | ICD-10-CM | POA: Diagnosis present

## 2018-12-12 LAB — BASIC METABOLIC PANEL
Anion gap: 8 (ref 5–15)
BUN: 28 mg/dL — ABNORMAL HIGH (ref 8–23)
CO2: 27 mmol/L (ref 22–32)
Calcium: 8.4 mg/dL — ABNORMAL LOW (ref 8.9–10.3)
Chloride: 103 mmol/L (ref 98–111)
Creatinine, Ser: 1.06 mg/dL (ref 0.61–1.24)
GFR calc Af Amer: >60 mL/min (ref >60)
GFR calc non Af Amer: >60 mL/min (ref >60)
Glucose, Bld: 121 mg/dL — ABNORMAL HIGH (ref 70–99)
Potassium: 3.9 mmol/L (ref 3.5–5.1)
Sodium: 138 mmol/L (ref 135–145)

## 2018-12-12 LAB — CBC WITH DIFFERENTIAL/PLATELET
Abs Immature Granulocytes: 0.03 10*3/uL (ref 0.00–0.07)
Basophils Absolute: 0.1 10*3/uL (ref 0.0–0.1)
Basophils Relative: 1 %
Eosinophils Absolute: 0.1 10*3/uL (ref 0.0–0.5)
Eosinophils Relative: 2 %
HCT: 25.9 % — ABNORMAL LOW (ref 39.0–52.0)
Hemoglobin: 8.1 g/dL — ABNORMAL LOW (ref 13.0–17.0)
Immature Granulocytes: 0 %
Lymphocytes Relative: 3 %
Lymphs Abs: 0.3 10*3/uL — ABNORMAL LOW (ref 0.7–4.0)
MCH: 25 pg — ABNORMAL LOW (ref 26.0–34.0)
MCHC: 31.3 g/dL (ref 30.0–36.0)
MCV: 79.9 fL — ABNORMAL LOW (ref 80.0–100.0)
Monocytes Absolute: 0.7 10*3/uL (ref 0.1–1.0)
Monocytes Relative: 8 %
Neutro Abs: 7.9 10*3/uL — ABNORMAL HIGH (ref 1.7–7.7)
Neutrophils Relative %: 86 %
Platelets: 351 10*3/uL (ref 150–400)
RBC: 3.24 MIL/uL — ABNORMAL LOW (ref 4.22–5.81)
RDW: 18.7 % — ABNORMAL HIGH (ref 11.5–15.5)
WBC: 9.1 10*3/uL (ref 4.0–10.5)
nRBC: 0 % (ref 0.0–0.2)

## 2018-12-12 LAB — GLUCOSE, CAPILLARY
Glucose-Capillary: 73 mg/dL (ref 70–99)
Glucose-Capillary: 162 mg/dL — ABNORMAL HIGH (ref 70–99)

## 2018-12-12 LAB — TROPONIN I: Troponin I: 1.27 ng/mL (ref <0.03)

## 2018-12-12 NOTE — Plan of Care (Signed)
  Problem: Clinical Measurements: Goal: Ability to maintain clinical measurements within normal limits will improve Outcome: Progressing Goal: Will remain free from infection Outcome: Progressing Goal: Respiratory complications will improve Outcome: Progressing   Problem: Activity: Goal: Risk for activity intolerance will decrease Outcome: Progressing   Problem: Elimination: Goal: Will not experience complications related to bowel motility Outcome: Progressing

## 2018-12-12 NOTE — Consult Note (Addendum)
[address]       [place] [address]             [phone]        Shalom L Golz [place] Medical Record #[id number] Date of Birth: 1956/05/04  Referring:RAI Primary Care: Biagio Borg, MD Primary Cardiologist:No primary care provider on file.  Chief Complaint:    Chief Complaint  Patient presents with   Shortness of Breath   History of Present Illness:      Mr. Mullett is a 63 yo white male with known medical history of CVA, Stage 3 SCC of LUL S/P radiation and incomplete chemotherapy, combined chronic CHF, hypothyroidism, HTN, HLD, DM, Chronic Hep C, and COPD.  He presented to the hospital with complaints of weakness.  He is currently homeless, on house arrest and living on his brother's front porch.  He states he was unable to move, not even able to get up and go to the bathroom.  He was recently admitted from [facility] on 4/15-4/22 with pleural effusion.  He had thoracentesis and cytology at that time was negative for malignancy and was felt to be related to CHF.  He was treated with aggressive diuresis and diuresed 18L.  His hemoglobin was low at 6.6 and he required transfusion with 1 unit of PRBC.  He was discharged home on house arrest on 11/27/2018.  He again presented to the ED on 12/11/2018 with complaints of weakness and shortness of breath.  He was again unable to move.  He did not take any of his prescribed medications at discharge due to not cashing his chest.  Workup showed a large pleural effusion.  BNP was elevated at 1993.5.  EKG did not show evidence of ischemia.  His hemoglobin was stable at 8.8, which was improved since previous admission.  It was felt he would require admission.  He underwent thoracentesis which yielded 200 ml of clear fluid.  He was started on aggressive IV diuretics.  We have been consulted for placement of pleur-x catheter.   Currently the patient is short of breath.  He continues to have lower extremity weakness.  He did work  with PT and was able to ambulate to the desk this morning.  He admits to experiencing about a 70 lb weight loss.  He also states he has been experiencing occasional night sweats.  He denies productive cough and hemoptysis.  He is a current smoke and denies alcohol use.  He denies being told he has malignancy.  Current Activity/ Functional Status:  Homeless on house arrest, presented with weakness and inability to get up  Past Medical History:  Diagnosis Date   Anemia    takes Ferrous Sulfated daily   Arthritis    Carotid stenosis 08/27/2014   takes Aspirin daily   Chronic hepatitis C ([place]) 09/17/2015   COPD (chronic obstructive pulmonary disease) (HCC)    Albuterol daily as needed and Spiriva daily   Depression    Diabetes mellitus type II    takes Metformin and Glipizide daily   Dizziness    GERD (gastroesophageal reflux disease)    GI bleed 12/2015   Headache    occasionally   History of blood clots    in right leg;being followed by Dr.Brabham for this   History of blood transfusion 09/2014   2 units-no abnormal reaction noted   History of gastric ulcer    History of hiatal hernia    Hyperlipidemia 08/27/2014  takes Simvastatin daily   Hypertension    takes Amlodipine and Lisinopril daily   Hypothyroidism 08/27/2014   takes Synthroid daily   Insomnia    takes Pamelor nightly   Joint pain    hips   Muscle spasm    takes Zanaflex daily as needed   MVP (mitral valve prolapse)    Peripheral edema    takes Furosemide daily as needed   Peripheral neuropathy    Pleural effusion 11/2018   Prurigo nodularis    Stroke [facility])     Past Surgical History:  Procedure Laterality Date   ENDARTERECTOMY Left 11/13/2014   Procedure: ENDARTERECTOMY CAROTID WITH PATCH ANGIOPLASTY;  Surgeon: Serafina Mitchell, MD;  Location: [facility];  Service: Vascular;  Laterality: Left;   ESOPHAGOGASTRODUODENOSCOPY N/A 09/24/2014   Procedure: ESOPHAGOGASTRODUODENOSCOPY (EGD);   Surgeon: Winfield Cunas., MD;  Location: [facility] ENDOSCOPY;  Service: Endoscopy;  Laterality: N/A;   FUDUCIAL PLACEMENT N/A 06/26/2017   Procedure: PLACEMENT OF FUDUCIAL;  Surgeon: Grace Isaac, MD;  Location: [place];  Service: Thoracic;  Laterality: N/A;   I&D of right arm     INCISION AND DRAINAGE PERIRECTAL ABSCESS  03/2009   IR THORACENTESIS ASP PLEURAL SPACE W/IMG GUIDE  11/21/2018   IR THORACENTESIS ASP PLEURAL SPACE W/IMG GUIDE  12/06/2018   IR THORACENTESIS ASP PLEURAL SPACE W/IMG GUIDE  12/11/2018   Neg Stress Test  2000   PERIPHERAL VASCULAR CATHETERIZATION N/A 11/30/2015   Procedure: Carotid PTA/Stent Intervention;  Surgeon: Serafina Mitchell, MD;  Location: [place] CV LAB;  Service: Cardiovascular;  Laterality: N/A;   ROTATOR CUFF REPAIR Right 2009   multiple f/u Sxs/infection Tx   VASCULAR SURGERY     VIDEO BRONCHOSCOPY WITH ENDOBRONCHIAL NAVIGATION N/A 05/25/2017   Procedure: VIDEO BRONCHOSCOPY WITH ENDOBRONCHIAL NAVIGATION;  Surgeon: Rigoberto Noel, MD;  Location: [place];  Service: Thoracic;  Laterality: N/A;   VIDEO BRONCHOSCOPY WITH ENDOBRONCHIAL NAVIGATION N/A 06/26/2017   Procedure: VIDEO BRONCHOSCOPY WITH ENDOBRONCHIAL NAVIGATION WITH PLACEMENT OF FIDUCIALS;  Surgeon: Grace Isaac, MD;  Location: [place];  Service: Thoracic;  Laterality: N/A;   VIDEO BRONCHOSCOPY WITH ENDOBRONCHIAL ULTRASOUND N/A 05/25/2017   Procedure: VIDEO BRONCHOSCOPY WITH ENDOBRONCHIAL ULTRASOUND;  Surgeon: Rigoberto Noel, MD;  Location: [facility] OR;  Service: Thoracic;  Laterality: N/A;    Social History   Tobacco Use  Smoking Status Current Every Day Smoker   Packs/day: 1.00   Years: 48.00   Pack years: 48.00   Types: Cigarettes  Smokeless Tobacco Never Used  Tobacco Comment   1-2 pks per day    Social History   Substance and Sexual Activity  Alcohol Use Not Currently   Alcohol/week: 1.0 standard drinks   Types: 1 Cans of beer per week   Comment: rarely      Allergies  Allergen Reactions   Doxycycline Hives and Itching   Lipitor [Atorvastatin] Other (See Comments)    Myalgia    Oxycodone Itching    Current Facility-Administered Medications  Medication Dose Route Frequency Provider Last Rate Last Dose   0.9 %  sodium chloride infusion  250 mL Intravenous PRN Fuller Plan A, MD       acetaminophen (TYLENOL) tablet 650 mg  650 mg Oral Q4H PRN Smith, Rondell A, MD       amLODipine (NORVASC) tablet 5 mg  5 mg Oral Daily Smith, Rondell A, MD   5 mg at 12/11/18 1841   aspirin EC tablet 81 mg  81 mg  Oral Daily Fuller Plan A, MD       ferrous sulfate tablet 325 mg  325 mg Oral TID WC Smith, Rondell A, MD   325 mg at 12/12/18 0802   furosemide (LASIX) injection 40 mg  40 mg Intravenous BID Fuller Plan A, MD   40 mg at 12/12/18 0802   ipratropium-albuterol (DUONEB) 0.5-2.5 (3) MG/3ML nebulizer solution 3 mL  3 mL Nebulization Q6H PRN Norval Morton, MD       levothyroxine (SYNTHROID) tablet 112 mcg  112 mcg Oral Daily Fuller Plan A, MD   112 mcg at 12/11/18 1841   lidocaine (PF) (XYLOCAINE) 1 % injection    PRN Louk, Alexandra M, PA-C   5 mL at 12/11/18 1359   metoprolol succinate (TOPROL-XL) 24 hr tablet 25 mg  25 mg Oral Daily Smith, Rondell A, MD   25 mg at 12/11/18 1841   nicotine (NICODERM CQ - dosed in mg/24 hours) patch 21 mg  21 mg Transdermal Daily Tamala Julian, Rondell A, MD   21 mg at 12/11/18 1842   ondansetron (ZOFRAN) injection 4 mg  4 mg Intravenous Q6H PRN Fuller Plan A, MD       pantoprazole (PROTONIX) EC tablet 40 mg  40 mg Oral QHS Smith, Rondell A, MD   40 mg at 12/11/18 2342   potassium chloride SA (K-DUR) CR tablet 40 mEq  40 mEq Oral Daily Fuller Plan A, MD   40 mEq at 12/11/18 1841   sodium chloride flush (NS) 0.9 % injection 3 mL  3 mL Intravenous Q12H Smith, Rondell A, MD   3 mL at 12/11/18 2353   sodium chloride flush (NS) 0.9 % injection 3 mL  3 mL Intravenous PRN Fuller Plan A, MD        umeclidinium-vilanterol (ANORO ELLIPTA) 62.5-25 MCG/INH 1 puff  1 puff Inhalation Daily Fuller Plan A, MD   1 puff at 12/12/18 0834    Medications Prior to Admission  Medication Sig Dispense Refill Last Dose   amLODipine (NORVASC) 5 MG tablet Take 1 tablet (5 mg total) by mouth daily for 30 days. 30 tablet 0    aspirin EC 81 MG tablet Take 1 tablet (81 mg total) by mouth daily. (Patient not taking: Reported on 11/25/2018) 90 tablet 11 Not Taking at Unknown time   Blood Pressure Monitor DEVI Use to check Blood pressure daily (Patient not taking: Reported on 11/25/2018) 1 Device 0 Not Taking at Unknown time   ferrous sulfate 325 (65 FE) MG tablet Take 1 tablet (325 mg total) by mouth 3 (three) times daily with meals. (Patient not taking: Reported on 11/25/2018) 30 tablet 3 Not Taking at Unknown time   furosemide (LASIX) 80 MG tablet Take 1 tablet (80 mg total) by mouth daily for 30 days. (Patient not taking: Reported on 11/30/2018) 90 tablet 3 Not Taking at Unknown time   glucose blood (ACCU-CHEK AVIVA PLUS) test strip Use to check blood sugars daily Dx E11.9 (Patient not taking: Reported on 11/25/2018) 100 each 3 Not Taking at Unknown time   levothyroxine (SYNTHROID, LEVOTHROID) 112 MCG tablet Take 1 tablet (112 mcg total) daily by mouth. (Patient not taking: Reported on 11/25/2018) 90 tablet 3 Not Taking at Unknown time   metoprolol succinate (TOPROL-XL) 25 MG 24 hr tablet Take 1 tablet (25 mg total) by mouth daily for 30 days. (Patient not taking: Reported on 11/30/2018) 30 tablet 0 Not Taking at Unknown time   pantoprazole (PROTONIX) 40 MG tablet Take  1 tablet (40 mg total) by mouth at bedtime for 30 days. (Patient not taking: Reported on 11/30/2018) 30 tablet 0 Not Taking at Unknown time   umeclidinium-vilanterol (ANORO ELLIPTA) 62.5-25 MCG/INH AEPB Inhale 1 puff into the lungs daily. (Patient not taking: Reported on 11/25/2018) 1 each 3 Not Taking at Unknown time    Family History   Problem Relation Age of Onset   Heart disease Mother        automated implantable cardioverter-defibrillator    Diabetes type II Mother    Alcohol abuse Father    Lung cancer Father    Lung cancer Maternal Grandmother    Lung cancer Sister    Review of Systems:   ROS Pertinent items are noted in HPI.     Cardiac Review of Systems: Y or  [    ]= no  Chest Pain [ N   ]  Resting SOB [ Y  ] Exertional SOB  [Y  ]  Orthopnea [  ]   Pedal Edema [ Y  ]    Palpitations [ N ] Syncope  [  ]   Presyncope [   ]  General Review of Systems: [Y] = yes [  ]=no Constitional: recent weight change [ Y ]; anorexia [Y  ]; fatigue [  ]; nausea [  ]; night sweats [Y  ]; fever [  ]; or chills [  ]                                                               Dental: Last Dentist visit:   Eye : blurred vision [  ]; diplopia [   ]; vision changes [  ];  Amaurosis fugax[  ]; Resp: cough [ N ];  wheezing[  ];  hemoptysis[ N ]; shortness of breath[Y  ]; paroxysmal nocturnal dyspnea[  ]; dyspnea on exertion[Y  ]; or orthopnea[  ];  GI:  gallstones[  ], vomiting[N  ];  dysphagia[  ]; melena[  ];  hematochezia [  ]; heartburn[  ];   Hx of  Colonoscopy[  ]; GU: kidney stones [  ]; hematuria[  ];   dysuria [  ];  nocturia[  ];  history of     obstruction [  ]; urinary frequency [  ]             Skin: rash, swelling[  ];, hair loss[  ];  peripheral edema[ Y ];  or itching[  ]; Musculosketetal: myalgias[Y  ];  joint swelling[  ];  joint erythema[  ]; WEAKNESS  joint pain[  ];  back pain[  ];  Heme/Lymph: bruising[  ];  bleeding[  ];  anemia[  ];  Neuro: TIA[  ];  headaches[  ];  stroke[y  ];  vertigo[  ];  seizures[  ];   paresthesias[  ];  difficulty walking[ y ];  Psych:depression[  ]; anxiety[  ];  Endocrine: diabetes[  ];  thyroid dysfunction[ Y ];  Physical Exam: BP (!) 163/69 (BP Location: Left Arm)    Pulse 64    Temp 98 F (36.7 C) (Oral)    Resp 18    Wt 85.8 kg Comment: scale c   SpO2 97%    BMI 26.39  kg/m  General appearance: alert, cooperative and no distress Head: Normocephalic, without obvious abnormality, atraumatic Resp: diminished breath sounds bibasilar Cardio: regular rate and rhythm GI: soft, non-tender; bowel sounds normal; no masses,  no organomegaly Extremities: edema trace Neurologic: Grossly normal  Diagnostic Studies & Laboratory data:     Recent Radiology Findings:    CT SCAN CHEST/ABD/PELVIS 11/30/2018  IMPRESSION: 1. Large centrally necrotic mass in the left upper lobe, likely to reflect treated neoplasm. Given the appearance of this lesion on today's examination, this is favored to reflect a large pulmonary abscess at this time. This is associated with a moderate loculated left pleural effusion, largest component of which is subpulmonic in position. 2. New 2.8 x 1.8 cm right lower lobe pulmonary nodule, multiple right adrenal nodules and masses, and new lytic lesions in the left side of the manubrium and the right S1 vertebra, compatible with widespread metastatic disease. 3. Colonic diverticulosis without evidence of acute diverticulitis at this time. 4. Aortic atherosclerosis, in addition to left main and 3 vessel coronary artery disease. Please note that although the presence of coronary artery calcium documents the presence of coronary artery disease, the severity of this disease and any potential stenosis cannot be assessed on this non-gated CT examination. Assessment for potential risk factor modification, dietary therapy or pharmacologic therapy may be warranted, if clinically indicated. 5. Additional incidental findings, as above.   Dg Chest 1 View  Result Date: 12/11/2018 CLINICAL DATA:  Status post left thoracentesis. EXAM: CHEST  1 VIEW COMPARISON:  PA and lateral chest 12/11/2018. FINDINGS: No pneumothorax is identified. Extensive opacity throughout the left chest appears worse than on the prior exam. The right lung is clear. Cardiac  silhouette is obscured. IMPRESSION: Negative for pneumothorax after thoracentesis. Extensive opacity throughout the left chest appears worse than on the comparison study. Electronically Signed   By: Inge Rise M.D.   On: 12/11/2018 14:51   Dg Chest 2 View  Result Date: 12/11/2018 CLINICAL DATA:  Ongoing shortness of breath.  Recent thoracentesis EXAM: CHEST - 2 VIEW COMPARISON:  Five days ago FINDINGS: Extensive opacification and volume loss on the left. The right chest is comparatively clear with improved interstitial opacity since prior. Normal heart size which is displaced to the left. IMPRESSION: Unchanged extensive opacification and volume loss on the left where there is a necrotic lung mass. Electronically Signed   By: Monte Fantasia M.D.   On: 12/11/2018 08:50   Ir Thoracentesis Asp Pleural Space W/img Guide  Result Date: 12/11/2018 INDICATION: Patient with history of lung cancer (large centrally necrotic mass in LUL), dyspnea, and recurrent left pleural effusion. Request is made for therapeutic left thoracentesis. EXAM: ULTRASOUND GUIDED THERAPEUTIC LEFT THORACENTESIS MEDICATIONS: 10 mL 1% lidocaine COMPLICATIONS: None immediate. PROCEDURE: An ultrasound guided thoracentesis was thoroughly discussed with the patient and questions answered. The benefits, risks, alternatives and complications were also discussed. The patient understands and wishes to proceed with the procedure. Written consent was obtained. Ultrasound was performed to localize and mark an adequate pocket of fluid in the left chest. The area was then prepped and draped in the normal sterile fashion. 1% Lidocaine was used for local anesthesia. Under ultrasound guidance a 6 Fr Safe-T-Centesis catheter was introduced. Thoracentesis was performed. The catheter was removed and a dressing applied. FINDINGS: A total of approximately 200 mL of clear gold fluid was removed. Procedure was stopped after 200 mL due to complexity of pleural  effusion. IMPRESSION: Successful ultrasound guided left thoracentesis yielding 200 mL of pleural  fluid. Read by: Earley Abide, PA-C Electronically Signed   By: Jerilynn Mages.  Shick M.D.   On: 12/11/2018 14:56     I have independently reviewed the above radiologic studies and discussed with the patient   Recent Lab Findings: Lab Results  Component Value Date   WBC 9.1 12/12/2018   HGB 8.1 (L) 12/12/2018   HCT 25.9 (L) 12/12/2018   PLT 351 12/12/2018   GLUCOSE 121 (H) 12/12/2018   CHOL 90 11/21/2018   TRIG 45 11/21/2018   HDL 28 (L) 11/21/2018   LDLDIRECT 125.0 05/10/2016   LDLCALC 53 11/21/2018   ALT 31 12/11/2018   AST 39 12/11/2018   NA 138 12/12/2018   K 3.9 12/12/2018   CL 103 12/12/2018   CREATININE 1.06 12/12/2018   BUN 28 (H) 12/12/2018   CO2 27 12/12/2018   TSH 13.206 (H) 11/20/2018   INR 1.5 (H) 12/11/2018   HGBA1C 6.2 (H) 11/20/2018     Assessment / Plan:      1. Recurrent Pleural effusion- S/P Thoracentesis x 2-- likely multifactorial with chronic CHF and suspected Stage IV lung cancer, as recent CT scan is showing evidence of metastatic disease and new pulmonary nodules 2. Dispo- patient does not wish to have a Pleur-x catheter placed.  Would also be difficult as no SNF facility will take him due to his house arrest and RSA status, he is currently homeless and would not be ideal candidate to discharge with a chest tube in place.   Would not recommend plurix at this point and patient does not wish to have plurix placed, major part of chest xray changes are a large mass in left upper chest.   Grace Isaac MD      [address] [place] [address] Office [phone]   [place]

## 2018-12-12 NOTE — Consult Note (Signed)
   [facility] CM Inpatient Consult   12/12/2018  Brad [name] 01-13-1956 [id number]   We have reviewed your referral request and assigned this patient for outreach. Referred to [facility] Management Coordinator and [facility] Social Worker. However, patient has readmitted under observation prior to outreach.  Patient was referred from last admission due to homelessness and was to go to [facility] from hospital on 12/09/2018.  Patient had been recommended for Palliative Care follow up and with [facility] referral was in process.  Will follow for progress and with inpatient [facility] team.  Natividad Brood, RN BSN [facility] Liaison  [phone] business mobile phone Toll free office [phone]  Fax number: [phone] [email or web address]

## 2018-12-12 NOTE — Care Management Obs Status (Signed)
Ong NOTIFICATION   Patient Details  Name: Brad [name] MRN: [id number] Date of Birth: [birth year]   Medicare Observation Status Notification Given:  Yes Pt refused to sign letter.   Royston Bake, RN 12/12/2018, 12:47 PM

## 2018-12-12 NOTE — Evaluation (Signed)
Occupational Therapy Evaluation Patient Details Name: Brad [name] MRN: [id number] DOB: 08-13-1955 Today's Date: 12/12/2018    History of Present Illness Pt is a 63 y.o. male admitted 11/29/18-5/3 with generalized weakness with thoracentesis 4/25. Readmitted with weakness, SOB, pleural effusion, s/p thoracentesis 5/6. [facility] jail 4[facility]15-4/22 with likely CHF exacerbation; pt was reportedly d/c home on house arrest but unable to move. 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   Clinical Impression   Pt was functioning independently prior to admission in April. Presents with generalized weakness, decreased dynamic standing balance, impaired cognition and decreased activity tolerance. He requires up to min assist for ADL and mobility with RW. Pt's chief concern is his homelessness. Will follow acutely.    Follow Up Recommendations  SNF;Supervision/Assistance - 24 hour    Equipment Recommendations  3 in 1 bedside commode    Recommendations for Other Services       Precautions / Restrictions Precautions Precautions: Fall Precaution Comments: watch sats Restrictions Weight Bearing Restrictions: No      Mobility Bed Mobility Overal bed mobility: Needs Assistance Bed Mobility: Supine to Sit;Sit to Supine     Supine to sit: HOB elevated;Min guard Sit to supine: Min assist;HOB elevated   General bed mobility comments: use of rail, HOB up 30 degrees  Transfers Overall transfer level: Needs assistance Equipment used: Rolling walker (2 wheeled) Transfers: Sit to/from Stand Sit to Stand: Min guard         General transfer comment: min guard for safety, cues for hand placement    Balance Overall balance assessment: Needs assistance   Sitting balance-Leahy Scale: Good Sitting balance - Comments: able to perform figure 4 for socks     Standing balance-Leahy Scale: Fair Standing balance comment: can  release walker in static standing                           ADL either performed or assessed with clinical judgement   ADL Overall ADL's : Needs assistance/impaired Eating/Feeding: Independent;Sitting   Grooming: Wash/dry hands;Standing;Min guard   Upper Body Bathing: Set up;Supervision/ safety;Sitting   Lower Body Bathing: Minimal assistance;Sit to/from stand   Upper Body Dressing : Set up;Sitting   Lower Body Dressing: Minimal assistance;Sit to/from stand   Toilet Transfer: Minimal assistance;Ambulation;RW   Toileting- Clothing Manipulation and Hygiene: Minimal assistance;Sit to/from stand       Functional mobility during ADLs: Minimal assistance;Rolling walker General ADL Comments: refused toothbrushing, agreeable to using RW     Vision Baseline Vision/History: Wears glasses Wears Glasses: Reading only Patient Visual Report: Blurring of vision(R eye)       Perception     Praxis      Pertinent Vitals/Pain Pain Assessment: No/denies pain     Hand Dominance Right   Extremity/Trunk Assessment Upper Extremity Assessment Upper Extremity Assessment: RUE deficits/detail;LUE deficits/detail RUE Deficits / Details: grimaces with shoulder flexion, AROM to 100 degrees LUE Deficits / Details: grimaces with shoulder flexion, AROM to 100 degrees   Lower Extremity Assessment Lower Extremity Assessment: Defer to PT evaluation   Cervical / Trunk Assessment Cervical / Trunk Assessment: Kyphotic   Communication Communication Communication: No difficulties   Cognition Arousal/Alertness: Awake/alert Behavior During Therapy: Flat affect Overall Cognitive Status: Impaired/Different from baseline Area of Impairment: Orientation;Memory;Attention;Problem solving                 Orientation Level: Disoriented to;Time;Situation  Current Attention Level: Selective Memory: Decreased short-term memory   Safety/Judgement: Decreased awareness of deficits    Problem Solving: Slow processing;Requires verbal cues General Comments: pt reports he has been struggling more with his memory lately, vague when discussing the home in which he stayed before being readmitted   General Comments       Exercises     Shoulder Instructions      Home Living Family/patient expects to be discharged to:: Shelter/Homeless Living Arrangements: Alone                               Additional Comments: pt reports he was living in a house but can't go back there. Pt reports no family to assist and no place to return to      Prior Functioning/Environment Level of Independence: Independent        Comments: pt reports he was caring for himself without DME        OT Problem List: Decreased activity tolerance;Impaired balance (sitting and/or standing);Decreased safety awareness      OT Treatment/Interventions: Self-care/ADL training;DME and/or AE instruction;Energy conservation;Therapeutic activities;Patient/family education;Balance training    OT Goals(Current goals can be found in the care plan section) Acute Rehab OT Goals Patient Stated Goal: find permanent housing OT Goal Formulation: With patient Time For Goal Achievement: 12/26/18 Potential to Achieve Goals: Good ADL Goals Pt Will Perform Grooming: with modified independence;standing Pt Will Perform Lower Body Bathing: with modified independence;sit to/from stand Pt Will Perform Lower Body Dressing: with modified independence;sit to/from stand Pt Will Transfer to Toilet: with modified independence;ambulating;regular height toilet Pt Will Perform Toileting - Clothing Manipulation and hygiene: with modified independence;sit to/from stand  OT Frequency: Min 2X/week   Barriers to D/C: Decreased caregiver support          Co-evaluation              AM-PAC OT "6 Clicks" Daily Activity     Outcome Measure Help from another person eating meals?: None Help from another person  taking care of personal grooming?: A Little Help from another person toileting, which includes using toliet, bedpan, or urinal?: A Little Help from another person bathing (including washing, rinsing, drying)?: A Little Help from another person to put on and taking off regular upper body clothing?: None Help from another person to put on and taking off regular lower body clothing?: A Little 6 Click Score: 20   End of Session Equipment Utilized During Treatment: Rolling walker Nurse Communication: (ok to have ice cream)  Activity Tolerance: Patient tolerated treatment well Patient left: in bed;with call bell/phone within reach  OT Visit Diagnosis: Unsteadiness on feet (R26.81);Other abnormalities of gait and mobility (R26.89);Muscle weakness (generalized) (M62.81);Adult, failure to thrive (R62.7)                Time: 7793-9030 OT Time Calculation (min): 20 min Charges:  OT General Charges $OT Visit: 1 Visit OT Evaluation $OT Eval Moderate Complexity: 1 Mod  Malka So 12/12/2018, 3:04 PM  Nestor Lewandowsky, OTR/L Acute Rehabilitation Services Pager: [phone] Office: [phone]

## 2018-12-12 NOTE — Progress Notes (Addendum)
[facility]                                                                              Patient Demographics  Brad [name], is a 63 y.o. male, DOB - 05-Oct-1955, YIR:[id number]  Admit date - 12/11/2018   Admitting Physician Norval Morton, MD  Outpatient Primary MD for the patient is Biagio Borg, MD  Outpatient specialists:   LOS - 0  days   Medical records reviewed and are as summarized below:    Chief Complaint  Patient presents with   Shortness of Breath       Brief summary   Patient is a 63 year old male with COPD, stage III lung cancer, squamous cell status post XRT and incomplete chemo, CHF, EF 55 to 50%, grade 2 diastolic, diabetes mellitus, hypertension, PVD, chronic hepatitis C status post Harvoni, anemia, history of CVA, history of duodenal ulcers, history of RLE DVT, no longer on anticoagulation presented with shortness of breath and weakness.  Patient was just recently discharged on 5/3 (admitted 4/24-5/3 for CHF exacerbation and weakness).  Patient reported that he no longer has a place to live and prior to his discharge he had been feeling better.  He did not get his checkup cashed or get any of his medications that he was supposed to be on.  Notes progressively worsening lower extremity swelling, DOE, dry cough and dark stools.  Continues to smoke 1 to 2 packs cigarettes per day.  In ED vital signs were stable except respirations up to 32.  WBC is 12.5, troponin 0.48, stool guaiacs positive, chest x-ray extensive opacification and volume loss of the left lung field. COVID 19 negative  Assessment & Plan    Principal Problem:   Pleural effusion, large left-sided, recurrent in the setting of lung cancer -s/p thoracentesis by IR on 5/6 yielding 200 mL of clear fluid.  Chest x-ray post thoracentesis shows no pneumothorax, opacification of the left lung.  Fluid analysis from the previous admission had shown no malignant cells. -CT VS was consulted,  per recommendation, patient does not want Pleurx catheter and chest tube will not be ideal in the setting of homelessness   Active Problems:    Elevated troponin No chest pain.  Troponin 0.48, BNP 1993 at the time of admission Troponin has trended up to 1.27 this morning, cardiology consult obtained 2D echo 4/16 had shown EF of 55 to 60%, no WMA Continue Lasix, aspirin  Acute on chronic diastolic CHF -Likely due to noncompliance, patient has not been taking any of his medications that he is supposed to be on -BNP 1993 -Continue IV Lasix 40 mg every 12 hours, negative balance of 3.9 L  Generalized weakness/failure to thrive -In the setting of anemia, lung cancer, recurrent pleural effusion, generalizability -PT OT evaluation done, recommended skilled nursing facility.  However patient cannot be discharged to SNF as he is registered sex offender on house arrest. -Case management assisting with disposition for group homes or shelters  Anemia of chronic disease -Patient reports dark stools, hemoglobin has been in 7 range during previous admission 8.1 today, presented with hemoglobin of 8.8. Continue iron  replacement Had endoscopy in 09/2014 by Dr. Oletta Lamas which had shown 3 small ulcers in duodenal bulb, recommended PPI If H&H dropping, may need endoscopy inpatient versus outpatient  Metastatic lung CA -CT of the chest on 4/25 showed a large centrally necrotic mass concerning for pulmonary abscess, moderate loculated left sided pleural effusion.  New 2.8x 1.8 cm right lower lobe pulmonary nodule, multiple right adrenal nodules and masses, new lytic lesions on the left side of the manubrium and right S1 vertebrae compatible with widespread metastatic disease. -Patient had undergone a thoracentesis yesterday, chest x-ray post IR thoracentesis shows opacification of the left lung.  CT surgery consulted Oncology, Dr. Julien Nordmann has been notified - palliative medicine consulted for  [place]   Essential hypertension BP currently stable  Hypothyroidism -TSH 13.2 on 4/15 -Continue Synthroid, outpatient follow-up of thyroid studies in 4 weeks  History of GERD, duodenal ulcer Continue PPI  COPD -Currently stable, no exacerbation, continue inhalers as needed  History of tobacco abuse -Still continues to smoke 1 to 2 packs cigarettes per day -Counseled patient on tobacco cessation, continue nicotine patch  History of chronic hepatitis C Completed Harvoni  Right eye blurred vision Transient for 10 seconds, patient states this is chronic and happens now and then Otherwise no acute focal neurological deficits.  Follow closely. - will need CT head to r/o any mets. He has history of prior stroke on MRI in 2018    Code Status: DNR DVT Prophylaxis:  SCD's Family Communication: Discussed in detail with the patient, all imaging results, lab results explained to the patient    Disposition Plan: Difficult disposition, patient is a readmission as he is unable to take care of himself, does not have home and unable to place him in skilled nursing facility.  Time Spent in minutes 35 minutes  Procedures:  None  Consultants:   Cardiothoracic surgery IR  Antimicrobials:   Anti-infectives (From admission, onward)   None          Medications  Scheduled Meds:  amLODipine  5 mg Oral Daily   aspirin EC  81 mg Oral Daily   ferrous sulfate  325 mg Oral TID WC   furosemide  40 mg Intravenous BID   levothyroxine  112 mcg Oral Daily   metoprolol succinate  25 mg Oral Daily   nicotine  21 mg Transdermal Daily   pantoprazole  40 mg Oral QHS   potassium chloride  40 mEq Oral Daily   sodium chloride flush  3 mL Intravenous Q12H   umeclidinium-vilanterol  1 puff Inhalation Daily   Continuous Infusions:  sodium chloride     PRN Meds:.sodium chloride, acetaminophen, ipratropium-albuterol, lidocaine (PF), ondansetron (ZOFRAN) IV, sodium chloride  flush      Subjective:   Brad [name] was seen and examined today.  No acute complaints. Patient denies dizziness, chest pain, abdominal pain, N/V/D/C, new weakness, numbess, tingling. No acute events overnight.    Objective:   Vitals:   12/12/18 0003 12/12/18 0517 12/12/18 0835 12/12/18 1050  BP: (!) 149/54 (!) 163/69  (!) 143/59  Pulse: 72 64  60  Resp: 18 18    Temp: 98.7 F (37.1 C) 98 F (36.7 C)  98.3 F (36.8 C)  TempSrc: Oral Oral  Oral  SpO2: 96% 97% 97% 100%  Weight:  85.8 kg      Intake/Output Summary (Last 24 hours) at 12/12/2018 1119 Last data filed at 12/12/2018 1059 Gross per 24 hour  Intake 940 ml  Output 4900 ml  Net -3960 ml     Wt Readings from Last 3 Encounters:  12/12/18 85.8 kg  12/09/18 89.2 kg  11/27/18 86 kg     Exam  General: Alert and oriented x 2, NAD  Eyes:   HEENT:  Atraumatic, normocephalic,  Cardiovascular: S1 S2 auscultated,. Regular rate and rhythm.  Respiratory: Diminished breath sounds left  Gastrointestinal: Soft, nontender, nondistended, + bowel sounds  Ext: 1+ pedal edema bilaterally  Neuro:   Musculoskeletal: No digital cyanosis, clubbing  Skin: No rashes  Psych: Normal affect and demeanor, alert and oriented x2   Data Reviewed:  I have personally reviewed following labs and imaging studies  Micro Results Recent Results (from the past 240 hour(s))  SARS Coronavirus 2 (CEPHEID - Performed in [place] hospital lab), Hosp Order     Status: None   Collection Time: 12/11/18  8:14 AM  Result Value Ref Range Status   SARS Coronavirus 2 NEGATIVE NEGATIVE Final    Comment: (NOTE) If result is NEGATIVE SARS-CoV-2 target nucleic acids are NOT DETECTED. The SARS-CoV-2 RNA is generally detectable in upper and lower  respiratory specimens during the acute phase of infection. The lowest  concentration of SARS-CoV-2 viral copies this assay can detect is 250  copies / mL. A negative result does not preclude  SARS-CoV-2 infection  and should not be used as the sole basis for treatment or other  patient management decisions.  A negative result may occur with  improper specimen collection / handling, submission of specimen other  than nasopharyngeal swab, presence of viral mutation(s) within the  areas targeted by this assay, and inadequate number of viral copies  (<250 copies / mL). A negative result must be combined with clinical  observations, patient history, and epidemiological information. If result is POSITIVE SARS-CoV-2 target nucleic acids are DETECTED. The SARS-CoV-2 RNA is generally detectable in upper and lower  respiratory specimens dur ing the acute phase of infection.  Positive  results are indicative of active infection with SARS-CoV-2.  Clinical  correlation with patient history and other diagnostic information is  necessary to determine patient infection status.  Positive results do  not rule out bacterial infection or co-infection with other viruses. If result is PRESUMPTIVE POSTIVE SARS-CoV-2 nucleic acids MAY BE PRESENT.   A presumptive positive result was obtained on the submitted specimen  and confirmed on repeat testing.  While 2019 novel coronavirus  (SARS-CoV-2) nucleic acids may be present in the submitted sample  additional confirmatory testing may be necessary for epidemiological  and / or clinical management purposes  to differentiate between  SARS-CoV-2 and other Sarbecovirus currently known to infect humans.  If clinically indicated additional testing with an alternate test  methodology [phone]) is advised. The SARS-CoV-2 RNA is generally  detectable in upper and lower respiratory sp ecimens during the acute  phase of infection. The expected result is Negative. Fact Sheet for Patients:  StrictlyIdeas.no Fact Sheet for Healthcare Providers: BankingDealers.co.za This test is not yet approved or cleared by the  [place] FDA and has been authorized for detection and/or diagnosis of SARS-CoV-2 by FDA under an Emergency Use Authorization (EUA).  This EUA will remain in effect (meaning this test can be used) for the duration of the COVID-19 declaration under Section 564(b)(1) of the Act, 21 U.S.C. section 360bbb-3(b)(1), unless the authorization is terminated or revoked sooner. Performed at [facility], [place] [address], [place], [place] [address]     Radiology Reports Dg Chest 1 View  Result Date: 12/11/2018 CLINICAL DATA:  Status post left thoracentesis. EXAM: CHEST  1 VIEW COMPARISON:  PA and lateral chest 12/11/2018. FINDINGS: No pneumothorax is identified. Extensive opacity throughout the left chest appears worse than on the prior exam. The right lung is clear. Cardiac silhouette is obscured. IMPRESSION: Negative for pneumothorax after thoracentesis. Extensive opacity throughout the left chest appears worse than on the comparison study. Electronically Signed   By: Inge Rise M.D.   On: 12/11/2018 14:51   Dg Chest 1 View  Result Date: 12/06/2018 CLINICAL DATA:  Followup left thoracentesis EXAM: CHEST  1 VIEW COMPARISON:  12/05/2018 FINDINGS: Thoracentesis on the left by history. There continues to be extensive opacity within the left hemithorax. There is some aerated lung in the lower chest. The degree of aeration of the lower lung is probably improved. There is no evidence of pneumothorax. Right chest remains clear. IMPRESSION: Some improvement in aeration of the left lower lung following left thoracentesis. No pneumothorax. Electronically Signed   By: Nelson Chimes M.D.   On: 12/06/2018 10:56   Dg Chest 1 View  Result Date: 11/30/2018 CLINICAL DATA:  Status post left thoracentesis. EXAM: CHEST  1 VIEW COMPARISON:  November 30, 2018 FINDINGS: A significant left-sided pleural effusion remains. No pneumothorax after thoracentesis. The right lung is clear. IMPRESSION: Significant effusion and  underlying opacity remains on the left. However, no pneumothorax is seen after left-sided thoracentesis. Electronically Signed   By: Dorise Bullion III M.D   On: 11/30/2018 11:28   Dg Chest 1 View  Result Date: 11/21/2018 CLINICAL DATA:  Follow-up thoracentesis EXAM: CHEST  1 VIEW COMPARISON:  11/20/2018 FINDINGS: Right chest remains clear. Opacification of the left lung appears progressive. The amount of pleural density appears similar. No evidence of pneumothorax. IMPRESSION: Similar appearance of pleural density on the left. Worsened opacity of the left lung. Electronically Signed   By: Nelson Chimes M.D.   On: 11/21/2018 12:31   Dg Chest 2 View  Result Date: 12/11/2018 CLINICAL DATA:  Ongoing shortness of breath.  Recent thoracentesis EXAM: CHEST - 2 VIEW COMPARISON:  Five days ago FINDINGS: Extensive opacification and volume loss on the left. The right chest is comparatively clear with improved interstitial opacity since prior. Normal heart size which is displaced to the left. IMPRESSION: Unchanged extensive opacification and volume loss on the left where there is a necrotic lung mass. Electronically Signed   By: Monte Fantasia M.D.   On: 12/11/2018 08:50   Dg Chest 2 View  Result Date: 11/30/2018 CLINICAL DATA:  Shortness of breath EXAM: CHEST - 2 VIEW COMPARISON:  11/21/2018 FINDINGS: Continued near complete opacification of the left hemithorax with left pleural density and airspace disease. Right lung clear. There is cardiomegaly. No acute bony abnormality. IMPRESSION: Continued near complete opacification of the left hemithorax with diffuse left lung airspace disease and left pleural density, presumably effusion. Electronically Signed   By: Rolm Baptise M.D.   On: 11/30/2018 02:27   Dg Chest 2 View  Result Date: 11/20/2018 CLINICAL DATA:  Chest pain EXAM: CHEST - 2 VIEW COMPARISON:  06/26/2017 FINDINGS: Large left pleural effusion with basilar and apical components. Approximately 50% volume  loss within the left lung. Left chest surgical clips. Right lung is clear. IMPRESSION: Large left pleural effusion with basilar and apical components with moderate left lung volume loss. Electronically Signed   By: Ulyses Jarred M.D.   On: 11/20/2018 17:51   Ct Chest W Contrast  Result Date: 11/30/2018  CLINICAL DATA:  63 year old male with history of non-small cell lung cancer. Staging examination. EXAM: CT CHEST, ABDOMEN, AND PELVIS WITH CONTRAST TECHNIQUE: Multidetector CT imaging of the chest, abdomen and pelvis was performed following the standard protocol during bolus administration of intravenous contrast. CONTRAST:  6mL OMNIPAQUE IOHEXOL 300 MG/ML  SOLN COMPARISON:  PET-CT 04/25/2017. FINDINGS: CT CHEST FINDINGS Cardiovascular: Heart size is mildly enlarged. There is no significant pericardial fluid, thickening or pericardial calcification. There is aortic atherosclerosis, as well as atherosclerosis of the great vessels of the mediastinum and the coronary arteries, including calcified atherosclerotic plaque in the left main, left anterior descending, left circumflex and right coronary arteries. Mediastinum/Nodes: No pathologically enlarged mediastinal or hilar lymph nodes. Esophagus is unremarkable in appearance. No axillary lymphadenopathy. Lungs/Pleura: Previously noted left upper lobe nodule is no longer clearly identifiable. Several fiducial markers are noted in the left upper lobe. On today's examination there is a new 7.8 x 9.5 x 8.3 cm mass-like area (axial image 21 of series 4 and coronal image 105 of series 6) which is centrally hypovascular with multiple internal locules of gas, favored to represent a large centrally necrotic mass, likely with superinfection (i.e., a large pulmonary abscess) with some adjacent postobstructive changes in the remaining portions of the left upper lobe. This is associated with outward bowing of the left major fissure. Some consolidative changes in volume loss are  noted in the anterior and lateral aspect of the left lower lobe. Moderate left pleural effusion predominantly loculated in a subpulmonic position. New right lower lobe pulmonary nodule (axial image 131 of series 5) measuring 2.8 x 1.8 cm, concerning for metastatic lesion. No right pleural effusion. Musculoskeletal: Lytic lesion in the left side of the manubrium measuring 2.5 x 1.7 cm (axial image 13 of series 4). CT ABDOMEN PELVIS FINDINGS Hepatobiliary: No suspicious cystic or solid hepatic lesions. No intra or extrahepatic biliary ductal dilatation. Gallbladder is unremarkable in appearance. Pancreas: No pancreatic mass. No pancreatic ductal dilatation. No pancreatic or peripancreatic fluid or inflammatory changes. Spleen: Unremarkable. Adrenals/Urinary Tract: Multiple new right adrenal nodules and masses, compatible with metastatic disease, largest of which measures 2.8 x 3.8 cm (axial image 71 of series 4). Left adrenal gland is normal in appearance. Left kidney is normal in appearance. Multiple low-attenuation lesions in the right kidney, compatible with simple cysts measuring up to 4.2 cm in the interpolar region. In the lower pole of the right kidney there is also an exophytic intermediate attenuation lesion which is incompletely characterized, but similar to prior PET-CT 04/25/2017, favored to represent a mildly proteinaceous/hemorrhagic cyst. No hydroureteronephrosis. Urinary bladder is normal in appearance. Stomach/Bowel: Normal appearance of the stomach. No pathologic dilatation of small bowel or colon. Numerous colonic diverticulae are noted, particularly in the proximal sigmoid colon, without surrounding inflammatory changes to suggest an acute diverticulitis at this time. Normal appendix. Vascular/Lymphatic: Aortic atherosclerosis, without evidence of aneurysm or dissection in the abdominal or pelvic vasculature. No lymphadenopathy noted in the abdomen or pelvis. Reproductive: Prostate gland and  seminal vesicles are unremarkable in appearance. Other: No significant volume of ascites.  No pneumoperitoneum. Musculoskeletal: Large lytic lesion in the right side of the S1 vertebra measuring 3.2 x 4.2 cm (axial image 97 of series 4). IMPRESSION: 1. Large centrally necrotic mass in the left upper lobe, likely to reflect treated neoplasm. Given the appearance of this lesion on today's examination, this is favored to reflect a large pulmonary abscess at this time. This is associated with a moderate loculated left  pleural effusion, largest component of which is subpulmonic in position. 2. New 2.8 x 1.8 cm right lower lobe pulmonary nodule, multiple right adrenal nodules and masses, and new lytic lesions in the left side of the manubrium and the right S1 vertebra, compatible with widespread metastatic disease. 3. Colonic diverticulosis without evidence of acute diverticulitis at this time. 4. Aortic atherosclerosis, in addition to left main and 3 vessel coronary artery disease. Please note that although the presence of coronary artery calcium documents the presence of coronary artery disease, the severity of this disease and any potential stenosis cannot be assessed on this non-gated CT examination. Assessment for potential risk factor modification, dietary therapy or pharmacologic therapy may be warranted, if clinically indicated. 5. Additional incidental findings, as above. Electronically Signed   By: Vinnie Langton M.D.   On: 11/30/2018 12:02   Ct Abdomen Pelvis W Contrast  Result Date: 11/30/2018 CLINICAL DATA:  63 year old male with history of non-small cell lung cancer. Staging examination. EXAM: CT CHEST, ABDOMEN, AND PELVIS WITH CONTRAST TECHNIQUE: Multidetector CT imaging of the chest, abdomen and pelvis was performed following the standard protocol during bolus administration of intravenous contrast. CONTRAST:  53mL OMNIPAQUE IOHEXOL 300 MG/ML  SOLN COMPARISON:  PET-CT 04/25/2017. FINDINGS: CT CHEST  FINDINGS Cardiovascular: Heart size is mildly enlarged. There is no significant pericardial fluid, thickening or pericardial calcification. There is aortic atherosclerosis, as well as atherosclerosis of the great vessels of the mediastinum and the coronary arteries, including calcified atherosclerotic plaque in the left main, left anterior descending, left circumflex and right coronary arteries. Mediastinum/Nodes: No pathologically enlarged mediastinal or hilar lymph nodes. Esophagus is unremarkable in appearance. No axillary lymphadenopathy. Lungs/Pleura: Previously noted left upper lobe nodule is no longer clearly identifiable. Several fiducial markers are noted in the left upper lobe. On today's examination there is a new 7.8 x 9.5 x 8.3 cm mass-like area (axial image 21 of series 4 and coronal image 105 of series 6) which is centrally hypovascular with multiple internal locules of gas, favored to represent a large centrally necrotic mass, likely with superinfection (i.e., a large pulmonary abscess) with some adjacent postobstructive changes in the remaining portions of the left upper lobe. This is associated with outward bowing of the left major fissure. Some consolidative changes in volume loss are noted in the anterior and lateral aspect of the left lower lobe. Moderate left pleural effusion predominantly loculated in a subpulmonic position. New right lower lobe pulmonary nodule (axial image 131 of series 5) measuring 2.8 x 1.8 cm, concerning for metastatic lesion. No right pleural effusion. Musculoskeletal: Lytic lesion in the left side of the manubrium measuring 2.5 x 1.7 cm (axial image 13 of series 4). CT ABDOMEN PELVIS FINDINGS Hepatobiliary: No suspicious cystic or solid hepatic lesions. No intra or extrahepatic biliary ductal dilatation. Gallbladder is unremarkable in appearance. Pancreas: No pancreatic mass. No pancreatic ductal dilatation. No pancreatic or peripancreatic fluid or inflammatory changes.  Spleen: Unremarkable. Adrenals/Urinary Tract: Multiple new right adrenal nodules and masses, compatible with metastatic disease, largest of which measures 2.8 x 3.8 cm (axial image 71 of series 4). Left adrenal gland is normal in appearance. Left kidney is normal in appearance. Multiple low-attenuation lesions in the right kidney, compatible with simple cysts measuring up to 4.2 cm in the interpolar region. In the lower pole of the right kidney there is also an exophytic intermediate attenuation lesion which is incompletely characterized, but similar to prior PET-CT 04/25/2017, favored to represent a mildly proteinaceous/hemorrhagic cyst. No hydroureteronephrosis.  Urinary bladder is normal in appearance. Stomach/Bowel: Normal appearance of the stomach. No pathologic dilatation of small bowel or colon. Numerous colonic diverticulae are noted, particularly in the proximal sigmoid colon, without surrounding inflammatory changes to suggest an acute diverticulitis at this time. Normal appendix. Vascular/Lymphatic: Aortic atherosclerosis, without evidence of aneurysm or dissection in the abdominal or pelvic vasculature. No lymphadenopathy noted in the abdomen or pelvis. Reproductive: Prostate gland and seminal vesicles are unremarkable in appearance. Other: No significant volume of ascites.  No pneumoperitoneum. Musculoskeletal: Large lytic lesion in the right side of the S1 vertebra measuring 3.2 x 4.2 cm (axial image 97 of series 4). IMPRESSION: 1. Large centrally necrotic mass in the left upper lobe, likely to reflect treated neoplasm. Given the appearance of this lesion on today's examination, this is favored to reflect a large pulmonary abscess at this time. This is associated with a moderate loculated left pleural effusion, largest component of which is subpulmonic in position. 2. New 2.8 x 1.8 cm right lower lobe pulmonary nodule, multiple right adrenal nodules and masses, and new lytic lesions in the left side of  the manubrium and the right S1 vertebra, compatible with widespread metastatic disease. 3. Colonic diverticulosis without evidence of acute diverticulitis at this time. 4. Aortic atherosclerosis, in addition to left main and 3 vessel coronary artery disease. Please note that although the presence of coronary artery calcium documents the presence of coronary artery disease, the severity of this disease and any potential stenosis cannot be assessed on this non-gated CT examination. Assessment for potential risk factor modification, dietary therapy or pharmacologic therapy may be warranted, if clinically indicated. 5. Additional incidental findings, as above. Electronically Signed   By: Vinnie Langton M.D.   On: 11/30/2018 12:02   Dg Chest Port 1 View  Result Date: 12/05/2018 CLINICAL DATA:  Pleural effusion. EXAM: PORTABLE CHEST 1 VIEW COMPARISON:  CT scan and radiograph of November 30, 2018. FINDINGS: Stable near complete opacification of left hemithorax is noted, with stable aerated lung seen in the left lower lobe. This is most consistent with pneumonia or atelectasis with associated pleural effusion. Mild mediastinal shift from right to left is noted. Atherosclerosis of thoracic aorta is noted. No pneumothorax is noted. Bony thorax is unremarkable. IMPRESSION: Stable left lung findings as described above. Aortic Atherosclerosis (ICD10-I70.0). Electronically Signed   By: Marijo Conception M.D.   On: 12/05/2018 07:41   Nm Scan Tumor Localize With Spect  Result Date: 11/25/2018 CLINICAL DATA:  HEART FAILURE. CONCERN FOR CARDIAC AMYLOIDOSIS. EXAM: NUCLEAR MEDICINE TUMOR LOCALIZATION. PYP CARDIAC AMYLOIDOSIS SCAN WITH SPECT TECHNIQUE: Following intravenous administration of radiopharmaceutical, anterior planar images of the chest were obtained. Regions of interest were placed on the heart and contralateral chest wall for quantitative assessment. Additional SPECT imaging of the chest was obtained.  RADIOPHARMACEUTICALS:  21.7 mCi TECHNETIUM 99 PYROPHOSPHATE FINDINGS: Planar Visual assessment: Anterior planar imaging demonstrates radiotracer uptake within the heart less than than uptake within the adjacent ribs (Grade 1). Quantitative assessment : Quantitative assessment of the cardiac uptake compared to the contralateral chest wall is equal to 1.0 (H/CL = 1.0). SPECT assessment: SPECT imaging of the chest demonstrates minimal radiotracer accumulation within the LEFT ventricle. IMPRESSION: Visual and quantitative assessment (grade 1, H/CLL equal 1.0) are NOT suggestive of transthyretin amyloidosis. Electronically Signed   By: Suzy Bouchard M.D.   On: 11/25/2018 14:33   Ir Thoracentesis Asp Pleural Space W/img Guide  Result Date: 12/11/2018 INDICATION: Patient with history of lung cancer (large  centrally necrotic mass in LUL), dyspnea, and recurrent left pleural effusion. Request is made for therapeutic left thoracentesis. EXAM: ULTRASOUND GUIDED THERAPEUTIC LEFT THORACENTESIS MEDICATIONS: 10 mL 1% lidocaine COMPLICATIONS: None immediate. PROCEDURE: An ultrasound guided thoracentesis was thoroughly discussed with the patient and questions answered. The benefits, risks, alternatives and complications were also discussed. The patient understands and wishes to proceed with the procedure. Written consent was obtained. Ultrasound was performed to localize and mark an adequate pocket of fluid in the left chest. The area was then prepped and draped in the normal sterile fashion. 1% Lidocaine was used for local anesthesia. Under ultrasound guidance a 6 Fr Safe-T-Centesis catheter was introduced. Thoracentesis was performed. The catheter was removed and a dressing applied. FINDINGS: A total of approximately 200 mL of clear gold fluid was removed. Procedure was stopped after 200 mL due to complexity of pleural effusion. IMPRESSION: Successful ultrasound guided left thoracentesis yielding 200 mL of pleural fluid.  Read by: Earley Abide, PA-C Electronically Signed   By: Jerilynn Mages.  Shick M.D.   On: 12/11/2018 14:56   Ir Thoracentesis Asp Pleural Space W/img Guide  Result Date: 12/06/2018 INDICATION: History of lung cancer. Recurrent left-sided pleural effusion. Request therapeutic thoracentesis. EXAM: ULTRASOUND GUIDED LEFT THORACENTESIS MEDICATIONS: None. COMPLICATIONS: None immediate. PROCEDURE: An ultrasound guided thoracentesis was thoroughly discussed with the patient and questions answered. The benefits, risks, alternatives and complications were also discussed. The patient understands and wishes to proceed with the procedure. Written consent was obtained. Ultrasound of the left chest demonstrates that the effusion has become severely loculated in the interval since previous procedure. Ultrasound was performed to localize and mark an adequate pocket of fluid in the left chest. The area was then prepped and draped in the normal sterile fashion. 1% Lidocaine was used for local anesthesia. Under ultrasound guidance a 6 Fr Safe-T-Centesis catheter was introduced. Thoracentesis was performed. The catheter was removed and a dressing applied. FINDINGS: A total of approximately 800 mL of clear, dark yellow fluid was removed. IMPRESSION: Successful ultrasound guided left thoracentesis yielding 800 mL of pleural fluid. Left pleural effusion has become markedly loculated in the interval. Read by: Ascencion Dike PA-C Electronically Signed   By: Markus Daft M.D.   On: 12/06/2018 10:40   Ir Thoracentesis Asp Pleural Space W/img Guide  Result Date: 11/21/2018 INDICATION: Patient with history of lung cancer, now with pleural effusion. Request is made for diagnostic and therapeutic left thoracentesis. EXAM: ULTRASOUND GUIDED DIAGNOSTIC AND THERAPEUTIC LEFT THORACENTESIS MEDICATIONS: 10 mL 1% lidocaine COMPLICATIONS: None immediate. PROCEDURE: An ultrasound guided thoracentesis was thoroughly discussed with the patient and questions  answered. The benefits, risks, alternatives and complications were also discussed. The patient understands and wishes to proceed with the procedure. Written consent was obtained. Ultrasound was performed to localize and mark an adequate pocket of fluid in the left chest. The area was then prepped and draped in the normal sterile fashion. 1% Lidocaine was used for local anesthesia. Under ultrasound guidance a 6 Fr Safe-T-Centesis catheter was introduced. Thoracentesis was performed. The catheter was removed and a dressing applied. FINDINGS: A total of approximately 1.3 liters of yellow fluid was removed. Samples were sent to the laboratory as requested by the clinical team. IMPRESSION: Successful ultrasound guided diagnostic and therapeutic left thoracentesis yielding 1.3 liters of pleural fluid. Read by: Brynda Greathouse PA-C Electronically Signed   By: Markus Daft M.D.   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 Noted to have recurrent left pleural effusion - last thoracentesis 11/21/18 yielding 1.3 L pleural fluid. Request has been made to IR for diagnostic and therapeutic thoracentesis. EXAM: ULTRASOUND GUIDED LEFT THORACENTESIS MEDICATIONS: 8 mL 1% lidocaine. COMPLICATIONS: None immediate. PROCEDURE: An ultrasound guided thoracentesis was thoroughly discussed with the patient and questions answered. The benefits, risks, alternatives and complications were also discussed. The patient understands and wishes to proceed with the procedure. Written consent was obtained. Ultrasound was performed to localize and mark an adequate pocket of fluid in the left chest. The area was then prepped and draped in the normal sterile fashion. 1% Lidocaine was used for local  anesthesia. Under ultrasound guidance a 6 Fr Safe-T-Centesis catheter was introduced. Thoracentesis was performed. The catheter was removed and a dressing applied. FINDINGS: A total of approximately 1.9 L of golden yellow fluid was removed. Procedure was aborted due to patient complaints of chest pain - some residual fluid remains on post procedure ultrasound. samples were sent to the laboratory as requested by the clinical team. IMPRESSION: Successful ultrasound guided left thoracentesis yielding 1.9 L of pleural fluid. Read by Candiss Norse, PA-C Electronically Signed   By: Aletta Edouard M.D.   On: 11/30/2018 10:51    Lab Data:  CBC: Recent Labs  Lab 12/06/18 1839 12/07/18 0220 12/08/18 0332 12/11/18 0818 12/12/18 0018  WBC  --   --   --  12.5* 9.1  NEUTROABS  --   --   --  11.2* 7.9*  HGB 7.6* 7.6* 7.3* 8.8* 8.1*  HCT 24.3* 24.6* 23.2* 28.7* 25.9*  MCV  --   --   --  82.2 79.9*  PLT  --   --   --  377 497   Basic Metabolic Panel: Recent Labs  Lab 12/11/18 0818 12/12/18 0018  NA 133* 138  K 3.6 3.9  CL 101 103  CO2 22 27  GLUCOSE 131* 121*  BUN 28* 28*  CREATININE 1.16 1.06  CALCIUM 8.2* 8.4*   GFR: Estimated Creatinine Clearance: 77.9 mL/min (by C-G formula based on SCr of 1.06 mg/dL). Liver Function Tests: Recent Labs  Lab 12/11/18 0818  AST 39  ALT 31  ALKPHOS 220*  BILITOT 0.7  PROT 6.6  ALBUMIN 1.5*   No results for input(s): LIPASE, AMYLASE in the last 168 hours. No results for input(s): AMMONIA in the last 168 hours. Coagulation Profile: Recent Labs  Lab 12/11/18 0818  INR 1.5*   Cardiac Enzymes: Recent Labs  Lab 12/11/18 0818 12/11/18 1206 12/11/18 1816 12/12/18 0018  TROPONINI 0.48* 0.86* 1.34* 1.27*   BNP (last 3 results) No results for input(s): PROBNP in the last 8760 hours. HbA1C: No results for input(s): HGBA1C in the last 72 hours. CBG: Recent Labs  Lab 12/08/18 0739 12/08/18 1208 12/08/18 2116 12/09/18 0807  12/09/18 1156  GLUCAP 111* 204* 110* 158* 109*   Lipid Profile: No results for input(s): CHOL, HDL, LDLCALC, TRIG, CHOLHDL, LDLDIRECT in the last 72 hours. Thyroid Function Tests: No results for input(s): TSH, T4TOTAL, FREET4, T3FREE, THYROIDAB in the last 72 hours. Anemia Panel: No results for input(s): VITAMINB12, FOLATE, FERRITIN, TIBC, IRON, RETICCTPCT in the last 72 hours. Urine analysis:    Component Value Date/Time   COLORURINE YELLOW 12/11/2018 0800   APPEARANCEUR CLEAR 12/11/2018 0800   LABSPEC 1.014 12/11/2018  0800   PHURINE 6.0 12/11/2018 0800   GLUCOSEU 50 (A) 12/11/2018 0800   GLUCOSEU NEGATIVE 05/10/2016 1434   HGBUR SMALL (A) 12/11/2018 0800   BILIRUBINUR NEGATIVE 12/11/2018 0800   KETONESUR NEGATIVE 12/11/2018 0800   PROTEINUR >=300 (A) 12/11/2018 0800   UROBILINOGEN 0.2 05/10/2016 1434   NITRITE NEGATIVE 12/11/2018 0800   LEUKOCYTESUR NEGATIVE 12/11/2018 0800     Gloris Shiroma M.D. [facility] 12/12/2018, 11:19 AM  Pager: [phone] Between 7am to 7pm - call Pager - [phone]  After 7pm go to [email or web address] - password [email or web address]  Call night coverage person covering after 7pm

## 2018-12-12 NOTE — TOC Progression Note (Addendum)
Transition of Care [facility]) - Progression Note    Patient Details  Name: Brad [name] MRN: [id number] Date of Birth: May 05, 1956  Transition of Care [facility]) CM/SW Contact  Graves-Bigelow, Ocie Cornfield, RN Phone Number: 12/12/2018, 12:51 PM  Clinical Narrative:  CM is still trying to find placement in either a group home  vs ALF for this patient. With his extensive background- registered sex offender and on house arrest- many places are responding they can not assist. [facility] is working with Case Manager to see if any halfway-houses can assist with patient post transition from the health system. CM will continue to monitor.     Expected Discharge Plan: [place] Barriers to Discharge: No Barriers Identified  Expected Discharge Plan and Services Expected Discharge Plan: [place] In-house Referral: Clinical Social Work Discharge Planning Services: CM Consult   Living arrangements for the past 2 months: (Pt was staying on the front porch of his brother-n-laws home- (homeless) )                 DME Arranged: N/A         HH Arranged: RN, Disease Management, PT, Social Work [employer] Agency: [place] Date HH Agency Contacted: 12/11/18 Time HH Agency Contacted: 73 Representative spoke with at [place]: [place] ([place]) Interventions    Readmission Risk Interventions Readmission Risk Prevention Plan 12/06/2018 12/04/2018  Transportation Screening - Complete  HRI or [place] - Complete  Social Work Consult for [place] Planning/Counseling Complete -  Palliative Care Screening - Complete  Medication Review [employer]) - Complete  Some recent data might be hidden

## 2018-12-12 NOTE — Plan of Care (Signed)
  Problem: Education: Goal: Knowledge of General Education information will improve Description: Including pain rating scale, medication(s)/side effects and non-pharmacologic comfort measures Outcome: Progressing   Problem: Clinical Measurements: Goal: Will remain free from infection Outcome: Progressing Goal: Respiratory complications will improve Outcome: Progressing   Problem: Activity: Goal: Risk for activity intolerance will decrease Outcome: Progressing   

## 2018-12-12 NOTE — Evaluation (Signed)
Physical Therapy Evaluation Patient Details Name: Brad [name] MRN: [id number] DOB: 09/08/1955 Today's Date: 12/12/2018   History of Present Illness  Pt is a 63 y.o. male admitted 11/29/18-5/3 with generalized weakness with thoracentesis 4/25. Readmitted with weakness, SOB, pleural effusion, s/p thoracentesis 5/6. [facility]-4/22 with likely CHF exacerbation; pt was reportedly d/c home on house arrest but unable to move. 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  Clinical Impression  Pt pleasant, confused with decreased activity tolerance with noted desaturation to 88% on RA with gait. Pt with SpO2 90-91% at rest on RA and RN aware. Pt reports no D/C location and no available assist. PT will benefit from SNF at D/C along with acute therapy to address above and below deficits to decrease burden of care and fall risk.   HR 74 with gait    Follow Up Recommendations SNF;Supervision/Assistance - 24 hour    Equipment Recommendations  Rolling walker with 5" wheels    Recommendations for Other Services OT consult     Precautions / Restrictions Precautions Precautions: Fall Precaution Comments: watch sats Restrictions Weight Bearing Restrictions: No      Mobility  Bed Mobility Overal bed mobility: Needs Assistance Bed Mobility: Supine to Sit     Supine to sit: HOB elevated;Min guard     General bed mobility comments: HOB 20 degrees with increased time and use of rail   Transfers Overall transfer level: Needs assistance   Transfers: Sit to/from Stand Sit to Stand: Min guard         General transfer comment: guarding for safety with cues to control descent and move from chair  Ambulation/Gait Ambulation/Gait assistance: Min assist Gait Distance (Feet): 100 Feet Assistive device: Rolling walker (2 wheeled);None Gait Pattern/deviations: Trunk flexed;Wide base of support;Step-through  pattern;Decreased stride length   Gait velocity interpretation: <1.8 ft/sec, indicate of risk for recurrent falls General Gait Details: pt with wide unsteady gait reaching out for environmental support without use of AD. After 25' transitioned to RW with improved stability with mod cues to step into RW and extend trunk. Pt fatigued with gait with noted drop to 88% on RA with return to 90% at rest, RN aware  Stairs            Wheelchair Mobility    Modified Rankin (Stroke Patients Only)       Balance Overall balance assessment: Needs assistance Sitting-balance support: No upper extremity supported;Feet supported Sitting balance-Leahy Scale: Good       Standing balance-Leahy Scale: Fair Standing balance comment: benefits from UE support for dynamic movement                             Pertinent Vitals/Pain Pain Assessment: No/denies pain    Home Living Family/patient expects to be discharged to:: Shelter/Homeless Living Arrangements: Alone               Additional Comments: pt reports he was living in a house but can't go back there. Pt reports no family to assist and no place to return to    Prior Function Level of Independence: Independent         Comments: pt reports he was caring for himself without DME     Hand Dominance   Dominant Hand: Right    Extremity/Trunk Assessment   Upper Extremity Assessment Upper Extremity Assessment: Generalized weakness  Lower Extremity Assessment Lower Extremity Assessment: Generalized weakness    Cervical / Trunk Assessment Cervical / Trunk Assessment: Kyphotic  Communication   Communication: No difficulties  Cognition Arousal/Alertness: Awake/alert Behavior During Therapy: Flat affect Overall Cognitive Status: Impaired/Different from baseline Area of Impairment: Orientation;Attention;Memory;Problem solving;Following commands;Safety/judgement                 Orientation Level:  Disoriented to;Time;Situation Current Attention Level: Selective Memory: Decreased short-term memory Following Commands: Follows one step commands consistently Safety/Judgement: Decreased awareness of safety;Decreased awareness of deficits   Problem Solving: Slow processing;Requires verbal cues General Comments: pt unable to state where he was living, not oriented to day, month, year or situation.       General Comments      Exercises     Assessment/Plan    PT Assessment Patient needs continued PT services  PT Problem List Decreased strength;Decreased activity tolerance;Decreased balance;Decreased mobility;Decreased range of motion;Decreased knowledge of use of DME;Decreased safety awareness;Decreased cognition       PT Treatment Interventions DME instruction;Gait training;Stair training;Therapeutic activities;Functional mobility training;Therapeutic exercise;Balance training;Patient/family education;Cognitive remediation    PT Goals (Current goals can be found in the Care Plan section)  Acute Rehab PT Goals Patient Stated Goal: be able to walk around PT Goal Formulation: With patient Time For Goal Achievement: 12/26/18 Potential to Achieve Goals: Fair    Frequency Min 3X/week   Barriers to discharge Other (comment);Decreased caregiver support(homeless)      Co-evaluation               AM-PAC PT "6 Clicks" Mobility  Outcome Measure Help needed turning from your back to your side while in a flat bed without using bedrails?: A Little Help needed moving from lying on your back to sitting on the side of a flat bed without using bedrails?: A Little Help needed moving to and from a bed to a chair (including a wheelchair)?: A Little Help needed standing up from a chair using your arms (e.g., wheelchair or bedside chair)?: A Little Help needed to walk in hospital room?: A Little Help needed climbing 3-5 steps with a railing? : A Little 6 Click Score: 18    End of  Session Equipment Utilized During Treatment: Gait belt Activity Tolerance: Patient limited by fatigue Patient left: in chair;with call bell/phone within reach;with chair alarm set Nurse Communication: Mobility status PT Visit Diagnosis: Other abnormalities of gait and mobility (R26.89);Muscle weakness (generalized) (M62.81);Difficulty in walking, not elsewhere classified (R26.2)    Time: 1517-6160 PT Time Calculation (min) (ACUTE ONLY): 12 min   Charges:   PT Evaluation $PT Eval Moderate Complexity: 1 Mod          [place], PT Acute Rehabilitation Services Pager: [phone] Office: [place] 12/12/2018, 9:09 AM

## 2018-12-13 DIAGNOSIS — I214 Non-ST elevation (NSTEMI) myocardial infarction: Secondary | ICD-10-CM

## 2018-12-13 LAB — CBC
HCT: 25.7 % — ABNORMAL LOW (ref 39.0–52.0)
Hemoglobin: 8 g/dL — ABNORMAL LOW (ref 13.0–17.0)
MCH: 25.2 pg — ABNORMAL LOW (ref 26.0–34.0)
MCHC: 31.1 g/dL (ref 30.0–36.0)
MCV: 80.8 fL (ref 80.0–100.0)
Platelets: 350 10*3/uL (ref 150–400)
RBC: 3.18 MIL/uL — ABNORMAL LOW (ref 4.22–5.81)
RDW: 18.4 % — ABNORMAL HIGH (ref 11.5–15.5)
WBC: 9.3 10*3/uL (ref 4.0–10.5)
nRBC: 0 % (ref 0.0–0.2)

## 2018-12-13 LAB — BASIC METABOLIC PANEL
Anion gap: 11 (ref 5–15)
BUN: 29 mg/dL — ABNORMAL HIGH (ref 8–23)
CO2: 28 mmol/L (ref 22–32)
Calcium: 8.5 mg/dL — ABNORMAL LOW (ref 8.9–10.3)
Chloride: 96 mmol/L — ABNORMAL LOW (ref 98–111)
Creatinine, Ser: 1.28 mg/dL — ABNORMAL HIGH (ref 0.61–1.24)
GFR calc Af Amer: >60 mL/min (ref >60)
GFR calc non Af Amer: >60 mL/min (ref >60)
Glucose, Bld: 110 mg/dL — ABNORMAL HIGH (ref 70–99)
Potassium: 4.1 mmol/L (ref 3.5–5.1)
Sodium: 135 mmol/L (ref 135–145)

## 2018-12-13 LAB — GLUCOSE, CAPILLARY: Glucose-Capillary: 110 mg/dL — ABNORMAL HIGH (ref 70–99)

## 2018-12-13 MED ORDER — ALBUTEROL SULFATE HFA 108 (90 BASE) MCG/ACT IN AERS: 2.0000 | INHALATION_SPRAY | Freq: Four times a day (QID) | RESPIRATORY_TRACT | 2 refills | Status: DC | PRN

## 2018-12-13 MED ORDER — ASPIRIN EC 81 MG PO TBEC: 81.0000 mg | DELAYED_RELEASE_TABLET | Freq: Every day | ORAL | 0 refills | Status: AC

## 2018-12-13 MED ORDER — AMLODIPINE BESYLATE 5 MG PO TABS: 5.0000 mg | ORAL_TABLET | Freq: Every day | ORAL | 1 refills | Status: DC

## 2018-12-13 MED ORDER — METOPROLOL SUCCINATE ER 25 MG PO TB24: 25.0000 mg | ORAL_TABLET | Freq: Every day | ORAL | 1 refills | Status: AC

## 2018-12-13 MED ORDER — FUROSEMIDE 80 MG PO TABS: 80.0000 mg | ORAL_TABLET | Freq: Every day | ORAL | 0 refills | Status: DC

## 2018-12-13 MED ORDER — FUROSEMIDE 40 MG PO TABS
40.0000 mg | ORAL_TABLET | Freq: Two times a day (BID) | ORAL | Status: DC
  Administered 2018-12-13: 40 mg via ORAL
  Filled 2018-12-13: qty 1

## 2018-12-13 MED ORDER — PANTOPRAZOLE SODIUM 40 MG PO TBEC: 40.0000 mg | DELAYED_RELEASE_TABLET | Freq: Every day | ORAL | 1 refills | Status: AC

## 2018-12-13 MED ORDER — LEVOTHYROXINE SODIUM 112 MCG PO TABS: 112.0000 ug | ORAL_TABLET | Freq: Every day | ORAL | 3 refills | Status: AC

## 2018-12-13 MED FILL — PANTOPRAZOLE SOD DR 40 MG T: 40 | 60 days supply | Qty: 60 | Fill #0

## 2018-12-13 MED FILL — FUROSEMIDE 80 MG TAB: 80 | 90 days supply | Qty: 90 | Fill #0

## 2018-12-13 MED FILL — LEVOTHYROXINE 112 MCG TAB: 112 | 90 days supply | Qty: 90 | Fill #0

## 2018-12-13 MED FILL — PROVENTIL HFA 108 (90 BASE): 108 (90 BAS | 25 days supply | Qty: 7 | Fill #0

## 2018-12-13 MED FILL — ASPIRIN LOW DOSE 81 MG TBEC: 81 | 90 days supply | Qty: 90 | Fill #0

## 2018-12-13 MED FILL — METOPROLOL SUCCINATE ER 25: 25 | 60 days supply | Qty: 60 | Fill #0

## 2018-12-13 NOTE — Progress Notes (Signed)
Physical Therapy Treatment Patient Details Name: Brad [name] MRN: [id number] DOB: 1956-04-30 Today's Date: 12/13/2018    History of Present Illness Pt is a 63 y.o. male admitted 11/29/18-5/3 with generalized weakness with thoracentesis 4/25. Readmitted with weakness, SOB, pleural effusion, s/p thoracentesis 5/6. Recent admission from jail 4/15-4/22 with likely CHF exacerbation; pt was reportedly d/c home on house arrest but unable to move. 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    PT Comments    Pt with flat affect but improved orientation and memory today but remains unaware of month. Pt with improved pulmonary function today with SpO2 94-97% on RA throughout session with HR 60. Pt educated for RW use and HEP and needs cues and encouragement to mobilize with fatigue noted end of session. Pt remains limited by lack of housing for D/C. Pt also continues to note right eye blurry today.     Follow Up Recommendations  SNF;Supervision/Assistance - 24 hour     Equipment Recommendations  Rolling walker with 5" wheels    Recommendations for Other Services       Precautions / Restrictions Precautions Precautions: Fall Restrictions Weight Bearing Restrictions: No    Mobility  Bed Mobility Overal bed mobility: Needs Assistance Bed Mobility: Supine to Sit     Supine to sit: Supervision     General bed mobility comments: pt with increased time and rail to rise from surface  Transfers Overall transfer level: Needs assistance   Transfers: Sit to/from Stand Sit to Stand: Min guard         General transfer comment: min guard for safety, cues for hand placement  Ambulation/Gait Ambulation/Gait assistance: Min guard Gait Distance (Feet): 230 Feet Assistive device: Rolling walker (2 wheeled);None Gait Pattern/deviations: Trunk flexed;Wide base of support;Step-through pattern;Decreased stride length   Gait velocity  interpretation: >2.62 ft/sec, indicative of community ambulatory General Gait Details: pt with steady gait with use of RW with cues for posture and position in RW. Increased activity tolerance on RA with SPO2 95% on RA   Stairs             Wheelchair Mobility    Modified Rankin (Stroke Patients Only)       Balance Overall balance assessment: Needs assistance Sitting-balance support: No upper extremity supported;Feet supported Sitting balance-Leahy Scale: Good       Standing balance-Leahy Scale: Fair                              Cognition Arousal/Alertness: Awake/alert Behavior During Therapy: Flat affect Overall Cognitive Status: Impaired/Different from baseline Area of Impairment: Orientation;Memory;Attention;Problem solving                 Orientation Level: Disoriented to;Time;Situation Current Attention Level: Selective Memory: Decreased short-term memory Following Commands: Follows one step commands consistently Safety/Judgement: Decreased awareness of deficits   Problem Solving: Slow processing;Requires verbal cues General Comments: pt oriented to year and day not month      Exercises General Exercises - Lower Extremity Long Arc Quad: AROM;Both;Seated;20 reps Hip ABduction/ADduction: AROM;Both;Seated;15 reps Hip Flexion/Marching: AROM;Both;Seated;20 reps    General Comments        Pertinent Vitals/Pain Pain Assessment: No/denies pain    Home Living                      Prior Function  PT Goals (current goals can now be found in the care plan section) Progress towards PT goals: Progressing toward goals    Frequency           PT Plan Current plan remains appropriate    Co-evaluation              AM-PAC PT "6 Clicks" Mobility   Outcome Measure  Help needed turning from your back to your side while in a flat bed without using bedrails?: A Little Help needed moving from lying on your back  to sitting on the side of a flat bed without using bedrails?: A Little Help needed moving to and from a bed to a chair (including a wheelchair)?: A Little Help needed standing up from a chair using your arms (e.g., wheelchair or bedside chair)?: A Little Help needed to walk in hospital room?: A Little Help needed climbing 3-5 steps with a railing? : A Little 6 Click Score: 18    End of Session Equipment Utilized During Treatment: Gait belt Activity Tolerance: Patient tolerated treatment well Patient left: in chair;with call bell/phone within reach;with chair alarm set;with nursing/sitter in room Nurse Communication: Mobility status PT Visit Diagnosis: Other abnormalities of gait and mobility (R26.89);Muscle weakness (generalized) (M62.81);Difficulty in walking, not elsewhere classified (R26.2)     Time: 9326-7124 PT Time Calculation (min) (ACUTE ONLY): 18 min  Charges:  $Gait Training: 8-22 mins                     [place] Graffam Pam Drown, PT Acute Rehabilitation Services Pager: [phone] Office: [phone]    Sandy Salaam Sunjai Levandoski 12/13/2018, 12:18 PM

## 2018-12-13 NOTE — Consult Note (Signed)
   [facility] CM Inpatient Consult   12/13/2018  Brad [name] 03-12-1956 [id number]   Notified by inpatient [facility] inpatient RNCM, Jacqlyn Krauss patient has been accepted in the [facility] program.   [place] Management does not interfere with or replace any services arranged by the  transition of care  Team.  For questions, please contact:  Natividad Brood, RN BSN [place] [facility] Liaison  [phone] business mobile phone Toll free office [phone]  Fax number: [phone] [email or web address]

## 2018-12-13 NOTE — TOC Progression Note (Signed)
Transition of Care [facility]) - Progression Note    Patient Details  Name: Brad [name] MRN: [id number] Date of Birth: 1956-02-24  Transition of Care [facility] CM/SW Contact  Graves-Bigelow, Ocie Cornfield, RN Phone Number: 12/13/2018, 10:11 AM  Clinical Narrative:  CM reached out to [place] and [place] SN[facility]'s- according to [place] Guidelines for Skilled nursing facilities they are unable to accept patients that are listed on the sex offender registry. CM will continue to look into ALF's and group homes.     Expected Discharge Plan: [place] Barriers to Discharge: No Barriers Identified  Expected Discharge Plan and Services Expected Discharge Plan: [place] In-house Referral: Clinical Social Work Discharge Planning Services: CM Consult   Living arrangements for the past 2 months: (Pt was staying on the front porch of his brother-n-laws home- (homeless) )                 DME Arranged: N/A         HH Arranged: RN, Disease Management, PT, Social Work [employer] Agency: [place] Date HH Agency Contacted: 12/11/18 Time HH Agency Contacted: 51 Representative spoke with at [place]: [place] ([place]) Interventions    Readmission Risk Interventions Readmission Risk Prevention Plan 12/06/2018 12/04/2018  Transportation Screening - Complete  HRI or [place] - Complete  Social Work Consult for [place] Planning/Counseling Complete -  Palliative Care Screening - Complete  Medication Review Press photographer) - Complete  Some recent data might be hidden

## 2018-12-13 NOTE — Progress Notes (Signed)
[facility]                                                                              Patient Demographics  Brad [name], is a 63 y.o. male, DOB - 13-Nov-1955, FEO:[id number]  Admit date - 12/11/2018   Admitting Physician Norval Morton, MD  Outpatient Primary MD for the patient is Biagio Borg, MD  Outpatient specialists:   LOS - 1  days   Medical records reviewed and are as summarized below:    Chief Complaint  Patient presents with   Shortness of Breath       Brief summary   Patient is a 63 year old male with COPD, stage III lung cancer, squamous cell status post XRT and incomplete chemo, CHF, EF 55 to 32%, grade 2 diastolic, diabetes mellitus, hypertension, PVD, chronic hepatitis C status post Harvoni, anemia, history of CVA, history of duodenal ulcers, history of RLE DVT, no longer on anticoagulation presented with shortness of breath and weakness.  Patient was just recently discharged on 5/3 (admitted 4/24-5/3 for CHF exacerbation and weakness).  Patient reported that he no longer has a place to live and prior to his discharge he had been feeling better.  He did not get his checkup cashed or get any of his medications that he was supposed to be on.  Notes progressively worsening lower extremity swelling, DOE, dry cough and dark stools.  Continues to smoke 1 to 2 packs cigarettes per day.  In ED vital signs were stable except respirations up to 32.  WBC is 12.5, troponin 0.48, stool guaiacs positive, chest x-ray extensive opacification and volume loss of the left lung field. COVID 19 negative  Assessment & Plan    Principal Problem:   Pleural effusion, large left-sided, recurrent in the setting of lung cancer -s/p thoracentesis by IR on 5/6 yielding 200 mL of clear fluid.  Chest x-ray post thoracentesis shows no pneumothorax, opacification of the left lung.  Fluid analysis from the previous admission had shown no malignant cells. -CT VS was consulted,  per recommendation, patient does not want Pleurx catheter and chest tube will not be ideal in the setting of homelessness   Active Problems:    Elevated troponin No chest pain.  Troponin 0.48, BNP 1993 at the time of admission Troponin has trended up to 1.27 2D echo 4/16 had shown EF of 55 to 60%, no WMA Continue Lasix, aspirin -Cardiology consulted, seen by Dr. Virgina Jock.  Per cardiology, unfortunately not a candidate for aggressive invasive management for possible CAD with his metastatic lung cancer and anemia, guarded prognosis.  Palliative medicine consulted  Acute on chronic diastolic CHF -Likely due to noncompliance, patient has not been taking any of his medications that he is supposed to be on -BNP 1993 -Continue IV Lasix 40 mg every 12 hours, negative balance of 4.6 L -Creatinine 1.2, trending up, will transition to oral Lasix  Generalized weakness/failure to thrive -In the setting of anemia, lung cancer, recurrent pleural effusion, generalizability -PT OT evaluation done, recommended skilled nursing facility.  However patient cannot be discharged to SNF as he is registered sex offender on house arrest. -  Case management assisting with disposition for group homes or shelters  Anemia of chronic disease -Patient reports dark stools, hemoglobin has been in 7 range during previous admission 8.1 today, presented with hemoglobin of 8.8. Continue iron replacement Had endoscopy in 09/2014 by Dr. Oletta Lamas which had shown 3 small ulcers in duodenal bulb, recommended PPI -Palliative medicine consulted, will await goals of care.  H&H stable currently  Metastatic lung CA -CT of the chest on 4/25 showed a large centrally necrotic mass concerning for pulmonary abscess, moderate loculated left sided pleural effusion.  New 2.8x 1.8 cm right lower lobe pulmonary nodule, multiple right adrenal nodules and masses, new lytic lesions on the left side of the manubrium and right S1 vertebrae  compatible with widespread metastatic disease. -Patient had undergone a thoracentesis yesterday, chest x-ray post IR thoracentesis shows opacification of the left lung.  CT surgery consulted Oncology, Dr. Julien Nordmann has been notified - palliative medicine consulted for [place]   Essential hypertension BP currently stable  Hypothyroidism -TSH 13.2 on 4/15 -Continue Synthroid, outpatient follow-up of thyroid studies in 4 weeks  History of GERD, duodenal ulcer Continue PPI  COPD -Currently stable, no exacerbation, continue inhalers as needed  History of tobacco abuse -Still continues to smoke 1 to 2 packs cigarettes per day -Counseled patient on tobacco cessation, continue nicotine patch  History of chronic hepatitis C Completed Harvoni  Right eye blurred vision Transient for 10 seconds, patient states this is chronic and happens now and then Otherwise no acute focal neurological deficits.  Follow closely. - will need CT head to r/o any mets. He has history of prior stroke on MRI in 2018    Code Status: DNR DVT Prophylaxis:  SCD's Family Communication: Discussed in detail with the patient, all imaging results, lab results explained to the patient    Disposition Plan: Difficult disposition, patient is a readmission as he is unable to take care of himself, does not have home and unable to place him in skilled nursing facility.  Time Spent in minutes 25 minutes Procedures:  None  Consultants:   Cardiothoracic surgery IR Cardiology Palliative medicine  Antimicrobials:   Anti-infectives (From admission, onward)   None         Medications  Scheduled Meds:  amLODipine  5 mg Oral Daily   aspirin EC  81 mg Oral Daily   ferrous sulfate  325 mg Oral TID WC   furosemide  40 mg Intravenous BID   levothyroxine  112 mcg Oral Daily   metoprolol succinate  25 mg Oral Daily   nicotine  21 mg Transdermal Daily   pantoprazole  40 mg Oral QHS   potassium chloride  40  mEq Oral Daily   sodium chloride flush  3 mL Intravenous Q12H   umeclidinium-vilanterol  1 puff Inhalation Daily   Continuous Infusions:  sodium chloride     PRN Meds:.sodium chloride, acetaminophen, ipratropium-albuterol, lidocaine (PF), ondansetron (ZOFRAN) IV, sodium chloride flush      Subjective:   Darric Plante was seen and examined today.  Feeling better today, denies any specific complaints, no chest pain.  Shortness of breath is improving.  states leg swelling is better.   Patient denies dizziness, abdominal pain, N/V/D/C, new weakness, numbess, tingling. No acute events overnight.    Objective:   Vitals:   12/12/18 2008 12/13/18 0642 12/13/18 0646 12/13/18 1059  BP: (!) 153/47  (!) 153/63 (!) 153/62  Pulse: 69  (!) 57 (!) 58  Resp: 18  Temp: 98.7 F (37.1 C)     TempSrc: Oral  Oral   SpO2: 95%  95% 97%  Weight:  84.9 kg      Intake/Output Summary (Last 24 hours) at 12/13/2018 1241 Last data filed at 12/13/2018 1210 Gross per 24 hour  Intake 1182 ml  Output 1950 ml  Net -768 ml     Wt Readings from Last 3 Encounters:  12/13/18 84.9 kg  12/09/18 89.2 kg  11/27/18 86 kg   Physical Exam  General: Alert and oriented x 3, NAD  Eyes:   HEENT:    Cardiovascular: S1 S2 clear, no murmurs, RRR. trace pedal edema b/l  Respiratory: Diminished breath sound at the bases L>R  Gastrointestinal: Soft, nontender, nondistended, NBS  Ext: trace pedal edema bilaterally  Neuro: no new deficits  Musculoskeletal: No cyanosis, clubbing  Skin: No rashes  Psych: Normal affect and demeanor, alert and oriented x3     Data Reviewed:  I have personally reviewed following labs and imaging studies  Micro Results Recent Results (from the past 240 hour(s))  SARS Coronavirus 2 (CEPHEID - Performed in [place] hospital lab), Hosp Order     Status: None   Collection Time: 12/11/18  8:14 AM  Result Value Ref Range Status   SARS Coronavirus 2 NEGATIVE NEGATIVE  Final    Comment: (NOTE) If result is NEGATIVE SARS-CoV-2 target nucleic acids are NOT DETECTED. The SARS-CoV-2 RNA is generally detectable in upper and lower  respiratory specimens during the acute phase of infection. The lowest  concentration of SARS-CoV-2 viral copies this assay can detect is 250  copies / mL. A negative result does not preclude SARS-CoV-2 infection  and should not be used as the sole basis for treatment or other  patient management decisions.  A negative result may occur with  improper specimen collection / handling, submission of specimen other  than nasopharyngeal swab, presence of viral mutation(s) within the  areas targeted by this assay, and inadequate number of viral copies  (<250 copies / mL). A negative result must be combined with clinical  observations, patient history, and epidemiological information. If result is POSITIVE SARS-CoV-2 target nucleic acids are DETECTED. The SARS-CoV-2 RNA is generally detectable in upper and lower  respiratory specimens dur ing the acute phase of infection.  Positive  results are indicative of active infection with SARS-CoV-2.  Clinical  correlation with patient history and other diagnostic information is  necessary to determine patient infection status.  Positive results do  not rule out bacterial infection or co-infection with other viruses. If result is PRESUMPTIVE POSTIVE SARS-CoV-2 nucleic acids MAY BE PRESENT.   A presumptive positive result was obtained on the submitted specimen  and confirmed on repeat testing.  While 2019 novel coronavirus  (SARS-CoV-2) nucleic acids may be present in the submitted sample  additional confirmatory testing may be necessary for epidemiological  and / or clinical management purposes  to differentiate between  SARS-CoV-2 and other Sarbecovirus currently known to infect humans.  If clinically indicated additional testing with an alternate test  methodology [phone]) is advised. The  SARS-CoV-2 RNA is generally  detectable in upper and lower respiratory sp ecimens during the acute  phase of infection. The expected result is Negative. Fact Sheet for Patients:  StrictlyIdeas.no Fact Sheet for Healthcare Providers: BankingDealers.co.za This test is not yet approved or cleared by the [place] FDA and has been authorized for detection and/or diagnosis of SARS-CoV-2 by FDA under an Emergency Use Authorization (EUA).  This EUA will remain in effect (meaning this test can be used) for the duration of the COVID-19 declaration under Section 564(b)(1) of the Act, 21 U.S.C. section 360bbb-3(b)(1), unless the authorization is terminated or revoked sooner. Performed at [facility], [place] [address] [place], [place] [address]     Radiology Reports Dg Chest 1 View  Result Date: 12/11/2018 CLINICAL DATA:  Status post left thoracentesis. EXAM: CHEST  1 VIEW COMPARISON:  PA and lateral chest 12/11/2018. FINDINGS: No pneumothorax is identified. Extensive opacity throughout the left chest appears worse than on the prior exam. The right lung is clear. Cardiac silhouette is obscured. IMPRESSION: Negative for pneumothorax after thoracentesis. Extensive opacity throughout the left chest appears worse than on the comparison study. Electronically Signed   By: Inge Rise M.D.   On: 12/11/2018 14:51   Dg Chest 1 View  Result Date: 12/06/2018 CLINICAL DATA:  Followup left thoracentesis EXAM: CHEST  1 VIEW COMPARISON:  12/05/2018 FINDINGS: Thoracentesis on the left by history. There continues to be extensive opacity within the left hemithorax. There is some aerated lung in the lower chest. The degree of aeration of the lower lung is probably improved. There is no evidence of pneumothorax. Right chest remains clear. IMPRESSION: Some improvement in aeration of the left lower lung following left thoracentesis. No pneumothorax. Electronically  Signed   By: Nelson Chimes M.D.   On: 12/06/2018 10:56   Dg Chest 1 View  Result Date: 11/30/2018 CLINICAL DATA:  Status post left thoracentesis. EXAM: CHEST  1 VIEW COMPARISON:  November 30, 2018 FINDINGS: A significant left-sided pleural effusion remains. No pneumothorax after thoracentesis. The right lung is clear. IMPRESSION: Significant effusion and underlying opacity remains on the left. However, no pneumothorax is seen after left-sided thoracentesis. Electronically Signed   By: Dorise Bullion III M.D   On: 11/30/2018 11:28   Dg Chest 1 View  Result Date: 11/21/2018 CLINICAL DATA:  Follow-up thoracentesis EXAM: CHEST  1 VIEW COMPARISON:  11/20/2018 FINDINGS: Right chest remains clear. Opacification of the left lung appears progressive. The amount of pleural density appears similar. No evidence of pneumothorax. IMPRESSION: Similar appearance of pleural density on the left. Worsened opacity of the left lung. Electronically Signed   By: Nelson Chimes M.D.   On: 11/21/2018 12:31   Dg Chest 2 View  Result Date: 12/11/2018 CLINICAL DATA:  Ongoing shortness of breath.  Recent thoracentesis EXAM: CHEST - 2 VIEW COMPARISON:  Five days ago FINDINGS: Extensive opacification and volume loss on the left. The right chest is comparatively clear with improved interstitial opacity since prior. Normal heart size which is displaced to the left. IMPRESSION: Unchanged extensive opacification and volume loss on the left where there is a necrotic lung mass. Electronically Signed   By: Monte Fantasia M.D.   On: 12/11/2018 08:50   Dg Chest 2 View  Result Date: 11/30/2018 CLINICAL DATA:  Shortness of breath EXAM: CHEST - 2 VIEW COMPARISON:  11/21/2018 FINDINGS: Continued near complete opacification of the left hemithorax with left pleural density and airspace disease. Right lung clear. There is cardiomegaly. No acute bony abnormality. IMPRESSION: Continued near complete opacification of the left hemithorax with diffuse left  lung airspace disease and left pleural density, presumably effusion. Electronically Signed   By: Rolm Baptise M.D.   On: 11/30/2018 02:27   Dg Chest 2 View  Result Date: 11/20/2018 CLINICAL DATA:  Chest pain EXAM: CHEST - 2 VIEW COMPARISON:  06/26/2017 FINDINGS: Large left pleural effusion  with basilar and apical components. Approximately 50% volume loss within the left lung. Left chest surgical clips. Right lung is clear. IMPRESSION: Large left pleural effusion with basilar and apical components with moderate left lung volume loss. Electronically Signed   By: Ulyses Jarred M.D.   On: 11/20/2018 17:51   Ct Head Wo Contrast  Result Date: 12/12/2018 CLINICAL DATA:  Transient blurred vision. History of widespread metastatic lung cancer. EXAM: CT HEAD WITHOUT CONTRAST TECHNIQUE: Contiguous axial images were obtained from the base of the skull through the vertex without intravenous contrast. COMPARISON:  Brain MRI 06/23/2017 FINDINGS: Brain: There is no evidence of acute infarct, intracranial hemorrhage, mass, midline shift, or extra-axial fluid collection. Chronic infarcts are again seen in the basal ganglia bilaterally, thalami, and cerebellum. There is mild cerebral atrophy. Patchy cerebral white matter hypodensities are nonspecific but compatible with mild-to-moderate chronic small vessel ischemic disease. There is no evidence of vasogenic edema. Vascular: Calcified atherosclerosis at the skull base. No hyperdense vessel. Skull: No fracture or focal osseous lesion. Sinuses/Orbits: Unremarkable orbits. Small left maxillary sinus mucous retention cyst. Chronic trace left mastoid effusion. Other: None. IMPRESSION: 1. No evidence of acute intracranial abnormality or metastatic disease on this unenhanced study. 2. Chronic ischemic changes as above. Electronically Signed   By: Logan Bores M.D.   On: 12/12/2018 15:57   Ct Chest W Contrast  Result Date: 11/30/2018 CLINICAL DATA:  63 year old male with history of  non-small cell lung cancer. Staging examination. EXAM: CT CHEST, ABDOMEN, AND PELVIS WITH CONTRAST TECHNIQUE: Multidetector CT imaging of the chest, abdomen and pelvis was performed following the standard protocol during bolus administration of intravenous contrast. CONTRAST:  58mL OMNIPAQUE IOHEXOL 300 MG/ML  SOLN COMPARISON:  PET-CT 04/25/2017. FINDINGS: CT CHEST FINDINGS Cardiovascular: Heart size is mildly enlarged. There is no significant pericardial fluid, thickening or pericardial calcification. There is aortic atherosclerosis, as well as atherosclerosis of the great vessels of the mediastinum and the coronary arteries, including calcified atherosclerotic plaque in the left main, left anterior descending, left circumflex and right coronary arteries. Mediastinum/Nodes: No pathologically enlarged mediastinal or hilar lymph nodes. Esophagus is unremarkable in appearance. No axillary lymphadenopathy. Lungs/Pleura: Previously noted left upper lobe nodule is no longer clearly identifiable. Several fiducial markers are noted in the left upper lobe. On today's examination there is a new 7.8 x 9.5 x 8.3 cm mass-like area (axial image 21 of series 4 and coronal image 105 of series 6) which is centrally hypovascular with multiple internal locules of gas, favored to represent a large centrally necrotic mass, likely with superinfection (i.e., a large pulmonary abscess) with some adjacent postobstructive changes in the remaining portions of the left upper lobe. This is associated with outward bowing of the left major fissure. Some consolidative changes in volume loss are noted in the anterior and lateral aspect of the left lower lobe. Moderate left pleural effusion predominantly loculated in a subpulmonic position. New right lower lobe pulmonary nodule (axial image 131 of series 5) measuring 2.8 x 1.8 cm, concerning for metastatic lesion. No right pleural effusion. Musculoskeletal: Lytic lesion in the left side of the  manubrium measuring 2.5 x 1.7 cm (axial image 13 of series 4). CT ABDOMEN PELVIS FINDINGS Hepatobiliary: No suspicious cystic or solid hepatic lesions. No intra or extrahepatic biliary ductal dilatation. Gallbladder is unremarkable in appearance. Pancreas: No pancreatic mass. No pancreatic ductal dilatation. No pancreatic or peripancreatic fluid or inflammatory changes. Spleen: Unremarkable. Adrenals/Urinary Tract: Multiple new right adrenal nodules and masses, compatible with  metastatic disease, largest of which measures 2.8 x 3.8 cm (axial image 71 of series 4). Left adrenal gland is normal in appearance. Left kidney is normal in appearance. Multiple low-attenuation lesions in the right kidney, compatible with simple cysts measuring up to 4.2 cm in the interpolar region. In the lower pole of the right kidney there is also an exophytic intermediate attenuation lesion which is incompletely characterized, but similar to prior PET-CT 04/25/2017, favored to represent a mildly proteinaceous/hemorrhagic cyst. No hydroureteronephrosis. Urinary bladder is normal in appearance. Stomach/Bowel: Normal appearance of the stomach. No pathologic dilatation of small bowel or colon. Numerous colonic diverticulae are noted, particularly in the proximal sigmoid colon, without surrounding inflammatory changes to suggest an acute diverticulitis at this time. Normal appendix. Vascular/Lymphatic: Aortic atherosclerosis, without evidence of aneurysm or dissection in the abdominal or pelvic vasculature. No lymphadenopathy noted in the abdomen or pelvis. Reproductive: Prostate gland and seminal vesicles are unremarkable in appearance. Other: No significant volume of ascites.  No pneumoperitoneum. Musculoskeletal: Large lytic lesion in the right side of the S1 vertebra measuring 3.2 x 4.2 cm (axial image 97 of series 4). IMPRESSION: 1. Large centrally necrotic mass in the left upper lobe, likely to reflect treated neoplasm. Given the  appearance of this lesion on today's examination, this is favored to reflect a large pulmonary abscess at this time. This is associated with a moderate loculated left pleural effusion, largest component of which is subpulmonic in position. 2. New 2.8 x 1.8 cm right lower lobe pulmonary nodule, multiple right adrenal nodules and masses, and new lytic lesions in the left side of the manubrium and the right S1 vertebra, compatible with widespread metastatic disease. 3. Colonic diverticulosis without evidence of acute diverticulitis at this time. 4. Aortic atherosclerosis, in addition to left main and 3 vessel coronary artery disease. Please note that although the presence of coronary artery calcium documents the presence of coronary artery disease, the severity of this disease and any potential stenosis cannot be assessed on this non-gated CT examination. Assessment for potential risk factor modification, dietary therapy or pharmacologic therapy may be warranted, if clinically indicated. 5. Additional incidental findings, as above. Electronically Signed   By: Vinnie Langton M.D.   On: 11/30/2018 12:02   Ct Abdomen Pelvis W Contrast  Result Date: 11/30/2018 CLINICAL DATA:  63 year old male with history of non-small cell lung cancer. Staging examination. EXAM: CT CHEST, ABDOMEN, AND PELVIS WITH CONTRAST TECHNIQUE: Multidetector CT imaging of the chest, abdomen and pelvis was performed following the standard protocol during bolus administration of intravenous contrast. CONTRAST:  34mL OMNIPAQUE IOHEXOL 300 MG/ML  SOLN COMPARISON:  PET-CT 04/25/2017. FINDINGS: CT CHEST FINDINGS Cardiovascular: Heart size is mildly enlarged. There is no significant pericardial fluid, thickening or pericardial calcification. There is aortic atherosclerosis, as well as atherosclerosis of the great vessels of the mediastinum and the coronary arteries, including calcified atherosclerotic plaque in the left main, left anterior descending,  left circumflex and right coronary arteries. Mediastinum/Nodes: No pathologically enlarged mediastinal or hilar lymph nodes. Esophagus is unremarkable in appearance. No axillary lymphadenopathy. Lungs/Pleura: Previously noted left upper lobe nodule is no longer clearly identifiable. Several fiducial markers are noted in the left upper lobe. On today's examination there is a new 7.8 x 9.5 x 8.3 cm mass-like area (axial image 21 of series 4 and coronal image 105 of series 6) which is centrally hypovascular with multiple internal locules of gas, favored to represent a large centrally necrotic mass, likely with superinfection (i.e., a large  pulmonary abscess) with some adjacent postobstructive changes in the remaining portions of the left upper lobe. This is associated with outward bowing of the left major fissure. Some consolidative changes in volume loss are noted in the anterior and lateral aspect of the left lower lobe. Moderate left pleural effusion predominantly loculated in a subpulmonic position. New right lower lobe pulmonary nodule (axial image 131 of series 5) measuring 2.8 x 1.8 cm, concerning for metastatic lesion. No right pleural effusion. Musculoskeletal: Lytic lesion in the left side of the manubrium measuring 2.5 x 1.7 cm (axial image 13 of series 4). CT ABDOMEN PELVIS FINDINGS Hepatobiliary: No suspicious cystic or solid hepatic lesions. No intra or extrahepatic biliary ductal dilatation. Gallbladder is unremarkable in appearance. Pancreas: No pancreatic mass. No pancreatic ductal dilatation. No pancreatic or peripancreatic fluid or inflammatory changes. Spleen: Unremarkable. Adrenals/Urinary Tract: Multiple new right adrenal nodules and masses, compatible with metastatic disease, largest of which measures 2.8 x 3.8 cm (axial image 71 of series 4). Left adrenal gland is normal in appearance. Left kidney is normal in appearance. Multiple low-attenuation lesions in the right kidney, compatible with  simple cysts measuring up to 4.2 cm in the interpolar region. In the lower pole of the right kidney there is also an exophytic intermediate attenuation lesion which is incompletely characterized, but similar to prior PET-CT 04/25/2017, favored to represent a mildly proteinaceous/hemorrhagic cyst. No hydroureteronephrosis. Urinary bladder is normal in appearance. Stomach/Bowel: Normal appearance of the stomach. No pathologic dilatation of small bowel or colon. Numerous colonic diverticulae are noted, particularly in the proximal sigmoid colon, without surrounding inflammatory changes to suggest an acute diverticulitis at this time. Normal appendix. Vascular/Lymphatic: Aortic atherosclerosis, without evidence of aneurysm or dissection in the abdominal or pelvic vasculature. No lymphadenopathy noted in the abdomen or pelvis. Reproductive: Prostate gland and seminal vesicles are unremarkable in appearance. Other: No significant volume of ascites.  No pneumoperitoneum. Musculoskeletal: Large lytic lesion in the right side of the S1 vertebra measuring 3.2 x 4.2 cm (axial image 97 of series 4). IMPRESSION: 1. Large centrally necrotic mass in the left upper lobe, likely to reflect treated neoplasm. Given the appearance of this lesion on today's examination, this is favored to reflect a large pulmonary abscess at this time. This is associated with a moderate loculated left pleural effusion, largest component of which is subpulmonic in position. 2. New 2.8 x 1.8 cm right lower lobe pulmonary nodule, multiple right adrenal nodules and masses, and new lytic lesions in the left side of the manubrium and the right S1 vertebra, compatible with widespread metastatic disease. 3. Colonic diverticulosis without evidence of acute diverticulitis at this time. 4. Aortic atherosclerosis, in addition to left main and 3 vessel coronary artery disease. Please note that although the presence of coronary artery calcium documents the presence  of coronary artery disease, the severity of this disease and any potential stenosis cannot be assessed on this non-gated CT examination. Assessment for potential risk factor modification, dietary therapy or pharmacologic therapy may be warranted, if clinically indicated. 5. Additional incidental findings, as above. Electronically Signed   By: Vinnie Langton M.D.   On: 11/30/2018 12:02   Dg Chest Port 1 View  Result Date: 12/05/2018 CLINICAL DATA:  Pleural effusion. EXAM: PORTABLE CHEST 1 VIEW COMPARISON:  CT scan and radiograph of November 30, 2018. FINDINGS: Stable near complete opacification of left hemithorax is noted, with stable aerated lung seen in the left lower lobe. This is most consistent with pneumonia or atelectasis with  associated pleural effusion. Mild mediastinal shift from right to left is noted. Atherosclerosis of thoracic aorta is noted. No pneumothorax is noted. Bony thorax is unremarkable. IMPRESSION: Stable left lung findings as described above. Aortic Atherosclerosis (ICD10-I70.0). Electronically Signed   By: Marijo Conception M.D.   On: 12/05/2018 07:41   Nm Scan Tumor Localize With Spect  Result Date: 11/25/2018 CLINICAL DATA:  HEART FAILURE. CONCERN FOR CARDIAC AMYLOIDOSIS. EXAM: NUCLEAR MEDICINE TUMOR LOCALIZATION. PYP CARDIAC AMYLOIDOSIS SCAN WITH SPECT TECHNIQUE: Following intravenous administration of radiopharmaceutical, anterior planar images of the chest were obtained. Regions of interest were placed on the heart and contralateral chest wall for quantitative assessment. Additional SPECT imaging of the chest was obtained. RADIOPHARMACEUTICALS:  21.7 mCi TECHNETIUM 99 PYROPHOSPHATE FINDINGS: Planar Visual assessment: Anterior planar imaging demonstrates radiotracer uptake within the heart less than than uptake within the adjacent ribs (Grade 1). Quantitative assessment : Quantitative assessment of the cardiac uptake compared to the contralateral chest wall is equal to 1.0 (H/CL =  1.0). SPECT assessment: SPECT imaging of the chest demonstrates minimal radiotracer accumulation within the LEFT ventricle. IMPRESSION: Visual and quantitative assessment (grade 1, H/CLL equal 1.0) are NOT suggestive of transthyretin amyloidosis. Electronically Signed   By: Suzy Bouchard M.D.   On: 11/25/2018 14:33   Ir Thoracentesis Asp Pleural Space W/img Guide  Result Date: 12/11/2018 INDICATION: Patient with history of lung cancer (large centrally necrotic mass in LUL), dyspnea, and recurrent left pleural effusion. Request is made for therapeutic left thoracentesis. EXAM: ULTRASOUND GUIDED THERAPEUTIC LEFT THORACENTESIS MEDICATIONS: 10 mL 1% lidocaine COMPLICATIONS: None immediate. PROCEDURE: An ultrasound guided thoracentesis was thoroughly discussed with the patient and questions answered. The benefits, risks, alternatives and complications were also discussed. The patient understands and wishes to proceed with the procedure. Written consent was obtained. Ultrasound was performed to localize and mark an adequate pocket of fluid in the left chest. The area was then prepped and draped in the normal sterile fashion. 1% Lidocaine was used for local anesthesia. Under ultrasound guidance a 6 Fr Safe-T-Centesis catheter was introduced. Thoracentesis was performed. The catheter was removed and a dressing applied. FINDINGS: A total of approximately 200 mL of clear gold fluid was removed. Procedure was stopped after 200 mL due to complexity of pleural effusion. IMPRESSION: Successful ultrasound guided left thoracentesis yielding 200 mL of pleural fluid. Read by: Earley Abide, PA-C Electronically Signed   By: Jerilynn Mages.  Shick M.D.   On: 12/11/2018 14:56   Ir Thoracentesis Asp Pleural Space W/img Guide  Result Date: 12/06/2018 INDICATION: History of lung cancer. Recurrent left-sided pleural effusion. Request therapeutic thoracentesis. EXAM: ULTRASOUND GUIDED LEFT THORACENTESIS MEDICATIONS: None. COMPLICATIONS: None  immediate. PROCEDURE: An ultrasound guided thoracentesis was thoroughly discussed with the patient and questions answered. The benefits, risks, alternatives and complications were also discussed. The patient understands and wishes to proceed with the procedure. Written consent was obtained. Ultrasound of the left chest demonstrates that the effusion has become severely loculated in the interval since previous procedure. Ultrasound was performed to localize and mark an adequate pocket of fluid in the left chest. The area was then prepped and draped in the normal sterile fashion. 1% Lidocaine was used for local anesthesia. Under ultrasound guidance a 6 Fr Safe-T-Centesis catheter was introduced. Thoracentesis was performed. The catheter was removed and a dressing applied. FINDINGS: A total of approximately 800 mL of clear, dark yellow fluid was removed. IMPRESSION: Successful ultrasound guided left thoracentesis yielding 800 mL of pleural fluid. Left pleural effusion has become  markedly loculated in the interval. Read by: Ascencion Dike PA-C Electronically Signed   By: Markus Daft M.D.   On: 12/06/2018 10:40   Ir Thoracentesis Asp Pleural Space W/img Guide  Result Date: 11/21/2018 INDICATION: Patient with history of lung cancer, now with pleural effusion. Request is made for diagnostic and therapeutic left thoracentesis. EXAM: ULTRASOUND GUIDED DIAGNOSTIC AND THERAPEUTIC LEFT THORACENTESIS MEDICATIONS: 10 mL 1% lidocaine COMPLICATIONS: None immediate. PROCEDURE: An ultrasound guided thoracentesis was thoroughly discussed with the patient and questions answered. The benefits, risks, alternatives and complications were also discussed. The patient understands and wishes to proceed with the procedure. Written consent was obtained. Ultrasound was performed to localize and mark an adequate pocket of fluid in the left chest. The area was then prepped and draped in the normal sterile fashion. 1% Lidocaine was used for local  anesthesia. Under ultrasound guidance a 6 Fr Safe-T-Centesis catheter was introduced. Thoracentesis was performed. The catheter was removed and a dressing applied. FINDINGS: A total of approximately 1.3 liters of yellow fluid was removed. Samples were sent to the laboratory as requested by the clinical team. IMPRESSION: Successful ultrasound guided diagnostic and therapeutic left thoracentesis yielding 1.3 liters of pleural fluid. Read by: Brynda Greathouse PA-C Electronically Signed   By: Markus Daft M.D.   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 Noted to have recurrent left pleural effusion - last thoracentesis 11/21/18 yielding 1.3 L pleural fluid. Request has been made to IR for diagnostic and therapeutic thoracentesis. EXAM: ULTRASOUND GUIDED LEFT THORACENTESIS MEDICATIONS: 8 mL 1% lidocaine. COMPLICATIONS: None immediate. PROCEDURE: An ultrasound guided thoracentesis was thoroughly discussed with the patient and questions answered. The benefits, risks, alternatives and complications were also discussed. The patient understands and wishes to proceed with the procedure. Written consent was obtained. Ultrasound was performed to localize and mark an adequate pocket of fluid in the left chest. The area was then prepped and draped in the normal sterile fashion. 1% Lidocaine was used for local anesthesia. Under ultrasound guidance a 6 Fr Safe-T-Centesis catheter was introduced. Thoracentesis was performed. The catheter was removed and a dressing applied. FINDINGS: A total of approximately 1.9 L of golden yellow fluid was removed. Procedure was aborted due to patient complaints of chest pain - some residual fluid remains on post procedure ultrasound. samples were  sent to the laboratory as requested by the clinical team. IMPRESSION: Successful ultrasound guided left thoracentesis yielding 1.9 L of pleural fluid. Read by Candiss Norse, PA-C Electronically Signed   By: Aletta Edouard M.D.   On: 11/30/2018 10:51    Lab Data:  CBC: Recent Labs  Lab 12/07/18 0220 12/08/18 0332 12/11/18 0818 12/12/18 0018 12/13/18 0255  WBC  --   --  12.5* 9.1 9.3  NEUTROABS  --   --  11.2* 7.9*  --   HGB 7.6* 7.3* 8.8* 8.1* 8.0*  HCT 24.6* 23.2* 28.7* 25.9* 25.7*  MCV  --   --  82.2 79.9* 80.8  PLT  --   --  377 351 628   Basic Metabolic Panel: Recent Labs  Lab 12/11/18 0818 12/12/18 0018 12/13/18 0255  NA 133* 138 135  K 3.6 3.9 4.1  CL 101 103 96*  CO2 22 27 28   GLUCOSE 131* 121*  110*  BUN 28* 28* 29*  CREATININE 1.16 1.06 1.28*  CALCIUM 8.2* 8.4* 8.5*   GFR: Estimated Creatinine Clearance: 64.5 mL/min (A) (by C-G formula based on SCr of 1.28 mg/dL (H)). Liver Function Tests: Recent Labs  Lab 12/11/18 0818  AST 39  ALT 31  ALKPHOS 220*  BILITOT 0.7  PROT 6.6  ALBUMIN 1.5*   No results for input(s): LIPASE, AMYLASE in the last 168 hours. No results for input(s): AMMONIA in the last 168 hours. Coagulation Profile: Recent Labs  Lab 12/11/18 0818  INR 1.5*   Cardiac Enzymes: Recent Labs  Lab 12/11/18 0818 12/11/18 1206 12/11/18 1816 12/12/18 0018  TROPONINI 0.48* 0.86* 1.34* 1.27*   BNP (last 3 results) No results for input(s): PROBNP in the last 8760 hours. HbA1C: No results for input(s): HGBA1C in the last 72 hours. CBG: Recent Labs  Lab 12/09/18 0807 12/09/18 1156 12/11/18 1616 12/12/18 2046 12/13/18 0650  GLUCAP 158* 109* 73 162* 110*   Lipid Profile: No results for input(s): CHOL, HDL, LDLCALC, TRIG, CHOLHDL, LDLDIRECT in the last 72 hours. Thyroid Function Tests: No results for input(s): TSH, T4TOTAL, FREET4, T3FREE, THYROIDAB in the last 72 hours. Anemia Panel: No results for input(s): VITAMINB12, FOLATE,  FERRITIN, TIBC, IRON, RETICCTPCT in the last 72 hours. Urine analysis:    Component Value Date/Time   COLORURINE YELLOW 12/11/2018 0800   APPEARANCEUR CLEAR 12/11/2018 0800   LABSPEC 1.014 12/11/2018 0800   PHURINE 6.0 12/11/2018 0800   GLUCOSEU 50 (A) 12/11/2018 0800   GLUCOSEU NEGATIVE 05/10/2016 1434   HGBUR SMALL (A) 12/11/2018 0800   BILIRUBINUR NEGATIVE 12/11/2018 0800   KETONESUR NEGATIVE 12/11/2018 0800   PROTEINUR >=300 (A) 12/11/2018 0800   UROBILINOGEN 0.2 05/10/2016 1434   NITRITE NEGATIVE 12/11/2018 0800   LEUKOCYTESUR NEGATIVE 12/11/2018 0800     Rober Skeels M.D. [facility] 12/13/2018, 12:41 PM  Pager: [phone] Between 7am to 7pm - call Pager - [phone]  After 7pm go to [email or web address] - password [email or web address]  Call night coverage person covering after 7pm

## 2018-12-13 NOTE — TOC Transition Note (Signed)
Transition of Care [facility]) - CM/SW Discharge Note   Patient Details  Name: Brad [name] MRN: [id number] Date of Birth: [birth year]  Transition of Care [facility]) CM/SW Contact:  Bethena Roys, RN Phone Number: 12/13/2018, 5:12 PM   Clinical Narrative: Pt signed agreement for [place]. Information faxed back to [facility]. Medications delivered to room via TOC. Taxi will take to the [facility]. No further needs from CM at this time.       Final next level of care: [place] Barriers to Discharge: No Barriers Identified   Patient Goals and CMS Choice Patient states their goals for this hospitalization and ongoing recovery are:: to go home CMS Medicare.gov Compare Post Acute Care list provided to:: Patient     Discharge Plan and Services In-house Referral: Clinical Social Work Discharge Planning Services: CM Consult            DME Arranged: N/A         HH Arranged: RN, Disease Management, PT, Social Work [employer] Agency: [place] Date HH Agency Contacted: 12/11/18 Time HH Agency Contacted: 1430 Representative spoke with at [place]: [place] ([place]) Interventions     Readmission Risk Interventions Readmission Risk Prevention Plan 12/06/2018 12/04/2018  Transportation Screening - Complete  HRI or [place] - Complete  Social Work Consult for [place] Planning/Counseling Complete -  Palliative Care Screening - Complete  Medication Review [employer]) - Complete  Some recent data might be hidden

## 2018-12-13 NOTE — Progress Notes (Signed)
CSW provided cab voucher for the patient so that he could discharge safely to his hotel.   CSW signing off. No further interventions are required at this time.   Domenic Schwab, MSW, [place]

## 2018-12-13 NOTE — Discharge Summary (Signed)
Physician Discharge Summary   Patient ID: Brad [name] MRN: [id number] DOB/AGE: [birth year] 61 y.o.  Admit date: 12/11/2018 Discharge date: 12/13/2018  Primary Care Physician:  Biagio Borg, MD   Recommendations for Outpatient Follow-up:  1. Follow up with PCP in 1-2 weeks 2. Patient recommended to follow-up with Dr. Julien Nordmann in 1 to 2 weeks  Home Health: Home health PT RN  Equipment/Devices: DME rolling walker  Discharge Condition: Overall guarded prognosis but currently stable for discharge CODE STATUS: DNR Diet recommendation: Heart healthy diet   Discharge Diagnoses:    Large left-sided pleural effusion recurrent status post thoracentesis Elevated troponin likely due to demand ischemia Acute on chronic diastolic CHF Generalized weakness, failure to thrive Anemia of chronic disease Widespread metastatic lung cancer Essential hypertension Hypothyroidism GERD, history of duodenal ulcer COPD Active nicotine abuse History of chronic hepatitis C, completed Harvoni  severe medical noncompliance   Consults:   CT VS, Dr. Servando Snare Interventional radiology Cardiology, Dr. Virgina Jock   Allergies:   Allergies  Allergen Reactions  . Doxycycline Hives and Itching  . Lipitor [Atorvastatin] Other (See Comments)    Myalgia   . Oxycodone Itching     DISCHARGE MEDICATIONS: Allergies as of 12/13/2018      Reactions   Doxycycline Hives, Itching   Lipitor [atorvastatin] Other (See Comments)   Myalgia   Oxycodone Itching      Medication List    TAKE these medications   albuterol 108 (90 Base) MCG/ACT inhaler Commonly known as:  VENTOLIN HFA Inhale 2 puffs into the lungs every 6 (six) hours as needed for wheezing or shortness of breath.   amLODipine 5 MG tablet Commonly known as:  NORVASC Take 1 tablet (5 mg total) by mouth daily.   aspirin EC 81 MG tablet Take 1 tablet (81 mg total) by mouth daily.   Blood Pressure Monitor Devi Use to check Blood pressure  daily   ferrous sulfate 325 (65 FE) MG tablet Take 1 tablet (325 mg total) by mouth 3 (three) times daily with meals.   furosemide 80 MG tablet Commonly known as:  LASIX Take 1 tablet (80 mg total) by mouth daily.   glucose blood test strip Commonly known as:  Accu-Chek Aviva Plus Use to check blood sugars daily Dx E11.9   levothyroxine 112 MCG tablet Commonly known as:  SYNTHROID Take 1 tablet (112 mcg total) by mouth daily.   metoprolol succinate 25 MG 24 hr tablet Commonly known as:  TOPROL-XL Take 1 tablet (25 mg total) by mouth daily.   pantoprazole 40 MG tablet Commonly known as:  PROTONIX Take 1 tablet (40 mg total) by mouth daily. What changed:  when to take this   umeclidinium-vilanterol 62.5-25 MCG/INH Aepb Commonly known as:  Anoro Ellipta Inhale 1 puff into the lungs daily.            Durable Medical Equipment  (From admission, onward)         Start     Ordered   12/13/18 1625  For home use only DME Walker rolling  Once    Comments:  With 5 inches wheels  Question:  Patient needs a walker to treat with the following condition  Answer:  Gait instability   12/13/18 1624           Brief H and P: For complete details please refer to admission H and P, but in brief Patient is a 63 year old male with COPD, stage III lung cancer, squamous cell status post  XRT and incomplete chemo, CHF, EF 55 to 59%, grade 2 diastolic, diabetes mellitus, hypertension, PVD, chronic hepatitis C status post Harvoni, anemia, history of CVA, history of duodenal ulcers, history of RLE DVT, no longer on anticoagulation presented with shortness of breath and weakness.  Patient was just recently discharged on 5/3 (admitted 4/24-5/3 for CHF exacerbation and weakness).  Patient reported that he no longer has a place to live and prior to his discharge he had been feeling better.  He did not get his checkup cashed or get any of his medications that he was supposed to be on.  Notes  progressively worsening lower extremity swelling, DOE, dry cough and dark stools.  Continues to smoke 1 to 2 packs cigarettes per day.  In ED vital signs were stable except respirations up to 32.  WBC is 12.5, troponin 0.48, stool guaiacs positive, chest x-ray extensive opacification and volume loss of the left lung field. COVID 19 negative  Hospital Course:   Pleural effusion, large left-sided, recurrent in the setting of lung cancer -s/p thoracentesis by IR on 5/6 yielding 200 mL of clear fluid.  Chest x-ray post thoracentesis shows no pneumothorax, opacification of the left lung.  Fluid analysis from the previous admission had shown no malignant cells. -Cardiothoracic surgery was consulted, patient was seen by Dr. Servando Snare.  Per [facility] recommendations, patient does not want Pleurx catheter and chest tube will not be ideal in the setting of homelessness     Elevated troponin No chest pain.  Troponin 0.48, BNP 1993 at the time of admission Troponin has trended up to 1.27 likely due to demand ischemia 2D echo 4/16 had shown EF of 55 to 60%, no WMA Continue Lasix, aspirin -Cardiology was consulted, seen by Dr. Virgina Jock.  Per cardiology, unfortunately not a candidate for aggressive invasive management for possible CAD with his metastatic lung cancer and anemia, guarded prognosis.    Palliative care was also consulted.  Acute on chronic diastolic CHF -Likely due to noncompliance, patient has not been taking any of his medications that he is supposed to be on -BNP 1993 -Patient was placed on IV Lasix for aggressive diuresis.  Negative balance of 5.4 L.   -Creatinine 1.2, transitioned back to home dose of oral Lasix  Generalized weakness/failure to thrive -In the setting of anemia, lung cancer, recurrent pleural effusion, generalizability -PT OT evaluation done, recommended skilled nursing facility.  However patient cannot be discharged to SNF as he is registered sex offender on house  arrest. -Appreciate case management assistance.  Patient does not want to stay with any of his family members, cannot go to skilled nursing facility.  Case management discussed with [facility] and patient is agreeable to go to them.  He will transition to the motel with their assistance and patient will be given [employer] and cab. Transition of care pharmacy will bring the medications to the patient as patient did not pick up any of the medications from the prior discharge.  Anemia of chronic disease -Patient reports dark stools, hemoglobin has been in 7 range during previous admission 8.1 today, presented with hemoglobin of 8.8. Continue iron replacement Had endoscopy in 09/2014 by Dr. Oletta Lamas which had shown 3 small ulcers in duodenal bulb, recommended PPI -Continue PPI  Widespread metastatic lung CA -CT of the chest on 4/25 showed a large centrally necrotic mass concerning for pulmonary abscess, moderate loculated left sided pleural effusion.  New 2.8x 1.8 cm right lower lobe pulmonary nodule, multiple right adrenal nodules  and masses, new lytic lesions on the left side of the manubrium and right S1 vertebrae compatible with widespread metastatic disease. -Patient had undergone a thoracentesis yesterday, chest x-ray post IR thoracentesis shows opacification of the left lung.    CT surgery was consulted however patient refused Pleurx catheter and chest tube not ideal in his situation. Oncology, Dr. Julien Nordmann has been notified -Strong recommendation for palliative goals of care and possible residential hospice if patient is readmitted.   Essential hypertension BP currently stable  Hypothyroidism -TSH 13.2 on 4/15 -Continue Synthroid, outpatient follow-up of thyroid studies in 4 weeks  History of GERD, duodenal ulcer Continue PPI  COPD -Currently stable, no exacerbation, continue inhalers as needed  History of tobacco abuse -Still continues to smoke 1 to 2 packs cigarettes  per day -Counseled patient on tobacco cessation, patient was placed on nicotine patch  History of chronic hepatitis C Completed Harvoni  Right eye blurred vision Transient for 10 seconds on 5/7, patient states this is chronic and happens now and then Otherwise no acute focal neurological deficits.   -CT head showed no metastatic disease or stroke.     Day of Discharge S: No complaints, no chest pain.  Shortness of breath improved.  BP (!) 153/62   Pulse (!) 58   Temp 98.7 F (37.1 C) (Oral)   Resp 18   Wt 84.9 kg   SpO2 97%   BMI 26.11 kg/m   Physical Exam: General: Alert and awake oriented x3 not in any acute distress. HEENT: anicteric sclera, pupils reactive to light and accommodation CVS: S1-S2 clear no murmur rubs or gallops Chest: clear to auscultation bilaterally, no wheezing rales or rhonchi Abdomen: soft nontender, nondistended, normal bowel sounds Extremities: no cyanosis, clubbing or edema noted bilaterally Neuro: Cranial nerves II-XII intact, no focal neurological deficits   The results of significant diagnostics from this hospitalization (including imaging, microbiology, ancillary and laboratory) are listed below for reference.      Procedures/Studies:  Dg Chest 1 View  Result Date: 12/11/2018 CLINICAL DATA:  Status post left thoracentesis. EXAM: CHEST  1 VIEW COMPARISON:  PA and lateral chest 12/11/2018. FINDINGS: No pneumothorax is identified. Extensive opacity throughout the left chest appears worse than on the prior exam. The right lung is clear. Cardiac silhouette is obscured. IMPRESSION: Negative for pneumothorax after thoracentesis. Extensive opacity throughout the left chest appears worse than on the comparison study. Electronically Signed   By: Inge Rise M.D.   On: 12/11/2018 14:51   Dg Chest 1 View  Result Date: 12/06/2018 CLINICAL DATA:  Followup left thoracentesis EXAM: CHEST  1 VIEW COMPARISON:  12/05/2018 FINDINGS: Thoracentesis on the  left by history. There continues to be extensive opacity within the left hemithorax. There is some aerated lung in the lower chest. The degree of aeration of the lower lung is probably improved. There is no evidence of pneumothorax. Right chest remains clear. IMPRESSION: Some improvement in aeration of the left lower lung following left thoracentesis. No pneumothorax. Electronically Signed   By: Nelson Chimes M.D.   On: 12/06/2018 10:56   Dg Chest 1 View  Result Date: 11/30/2018 CLINICAL DATA:  Status post left thoracentesis. EXAM: CHEST  1 VIEW COMPARISON:  November 30, 2018 FINDINGS: A significant left-sided pleural effusion remains. No pneumothorax after thoracentesis. The right lung is clear. IMPRESSION: Significant effusion and underlying opacity remains on the left. However, no pneumothorax is seen after left-sided thoracentesis. Electronically Signed   By: Dorise Bullion III M.D  On: 11/30/2018 11:28   Dg Chest 1 View  Result Date: 11/21/2018 CLINICAL DATA:  Follow-up thoracentesis EXAM: CHEST  1 VIEW COMPARISON:  11/20/2018 FINDINGS: Right chest remains clear. Opacification of the left lung appears progressive. The amount of pleural density appears similar. No evidence of pneumothorax. IMPRESSION: Similar appearance of pleural density on the left. Worsened opacity of the left lung. Electronically Signed   By: Nelson Chimes M.D.   On: 11/21/2018 12:31   Dg Chest 2 View  Result Date: 12/11/2018 CLINICAL DATA:  Ongoing shortness of breath.  Recent thoracentesis EXAM: CHEST - 2 VIEW COMPARISON:  Five days ago FINDINGS: Extensive opacification and volume loss on the left. The right chest is comparatively clear with improved interstitial opacity since prior. Normal heart size which is displaced to the left. IMPRESSION: Unchanged extensive opacification and volume loss on the left where there is a necrotic lung mass. Electronically Signed   By: Monte Fantasia M.D.   On: 12/11/2018 08:50   Dg Chest 2  View  Result Date: 11/30/2018 CLINICAL DATA:  Shortness of breath EXAM: CHEST - 2 VIEW COMPARISON:  11/21/2018 FINDINGS: Continued near complete opacification of the left hemithorax with left pleural density and airspace disease. Right lung clear. There is cardiomegaly. No acute bony abnormality. IMPRESSION: Continued near complete opacification of the left hemithorax with diffuse left lung airspace disease and left pleural density, presumably effusion. Electronically Signed   By: Rolm Baptise M.D.   On: 11/30/2018 02:27   Dg Chest 2 View  Result Date: 11/20/2018 CLINICAL DATA:  Chest pain EXAM: CHEST - 2 VIEW COMPARISON:  06/26/2017 FINDINGS: Large left pleural effusion with basilar and apical components. Approximately 50% volume loss within the left lung. Left chest surgical clips. Right lung is clear. IMPRESSION: Large left pleural effusion with basilar and apical components with moderate left lung volume loss. Electronically Signed   By: Ulyses Jarred M.D.   On: 11/20/2018 17:51   Ct Head Wo Contrast  Result Date: 12/12/2018 CLINICAL DATA:  Transient blurred vision. History of widespread metastatic lung cancer. EXAM: CT HEAD WITHOUT CONTRAST TECHNIQUE: Contiguous axial images were obtained from the base of the skull through the vertex without intravenous contrast. COMPARISON:  Brain MRI 06/23/2017 FINDINGS: Brain: There is no evidence of acute infarct, intracranial hemorrhage, mass, midline shift, or extra-axial fluid collection. Chronic infarcts are again seen in the basal ganglia bilaterally, thalami, and cerebellum. There is mild cerebral atrophy. Patchy cerebral white matter hypodensities are nonspecific but compatible with mild-to-moderate chronic small vessel ischemic disease. There is no evidence of vasogenic edema. Vascular: Calcified atherosclerosis at the skull base. No hyperdense vessel. Skull: No fracture or focal osseous lesion. Sinuses/Orbits: Unremarkable orbits. Small left maxillary  sinus mucous retention cyst. Chronic trace left mastoid effusion. Other: None. IMPRESSION: 1. No evidence of acute intracranial abnormality or metastatic disease on this unenhanced study. 2. Chronic ischemic changes as above. Electronically Signed   By: Logan Bores M.D.   On: 12/12/2018 15:57   Ct Chest W Contrast  Result Date: 11/30/2018 CLINICAL DATA:  62 year old male with history of non-small cell lung cancer. Staging examination. EXAM: CT CHEST, ABDOMEN, AND PELVIS WITH CONTRAST TECHNIQUE: Multidetector CT imaging of the chest, abdomen and pelvis was performed following the standard protocol during bolus administration of intravenous contrast. CONTRAST:  67mL OMNIPAQUE IOHEXOL 300 MG/ML  SOLN COMPARISON:  PET-CT 04/25/2017. FINDINGS: CT CHEST FINDINGS Cardiovascular: Heart size is mildly enlarged. There is no significant pericardial fluid, thickening or pericardial calcification.  There is aortic atherosclerosis, as well as atherosclerosis of the great vessels of the mediastinum and the coronary arteries, including calcified atherosclerotic plaque in the left main, left anterior descending, left circumflex and right coronary arteries. Mediastinum/Nodes: No pathologically enlarged mediastinal or hilar lymph nodes. Esophagus is unremarkable in appearance. No axillary lymphadenopathy. Lungs/Pleura: Previously noted left upper lobe nodule is no longer clearly identifiable. Several fiducial markers are noted in the left upper lobe. On today's examination there is a new 7.8 x 9.5 x 8.3 cm mass-like area (axial image 21 of series 4 and coronal image 105 of series 6) which is centrally hypovascular with multiple internal locules of gas, favored to represent a large centrally necrotic mass, likely with superinfection (i.e., a large pulmonary abscess) with some adjacent postobstructive changes in the remaining portions of the left upper lobe. This is associated with outward bowing of the left major fissure. Some  consolidative changes in volume loss are noted in the anterior and lateral aspect of the left lower lobe. Moderate left pleural effusion predominantly loculated in a subpulmonic position. New right lower lobe pulmonary nodule (axial image 131 of series 5) measuring 2.8 x 1.8 cm, concerning for metastatic lesion. No right pleural effusion. Musculoskeletal: Lytic lesion in the left side of the manubrium measuring 2.5 x 1.7 cm (axial image 13 of series 4). CT ABDOMEN PELVIS FINDINGS Hepatobiliary: No suspicious cystic or solid hepatic lesions. No intra or extrahepatic biliary ductal dilatation. Gallbladder is unremarkable in appearance. Pancreas: No pancreatic mass. No pancreatic ductal dilatation. No pancreatic or peripancreatic fluid or inflammatory changes. Spleen: Unremarkable. Adrenals/Urinary Tract: Multiple new right adrenal nodules and masses, compatible with metastatic disease, largest of which measures 2.8 x 3.8 cm (axial image 71 of series 4). Left adrenal gland is normal in appearance. Left kidney is normal in appearance. Multiple low-attenuation lesions in the right kidney, compatible with simple cysts measuring up to 4.2 cm in the interpolar region. In the lower pole of the right kidney there is also an exophytic intermediate attenuation lesion which is incompletely characterized, but similar to prior PET-CT 04/25/2017, favored to represent a mildly proteinaceous/hemorrhagic cyst. No hydroureteronephrosis. Urinary bladder is normal in appearance. Stomach/Bowel: Normal appearance of the stomach. No pathologic dilatation of small bowel or colon. Numerous colonic diverticulae are noted, particularly in the proximal sigmoid colon, without surrounding inflammatory changes to suggest an acute diverticulitis at this time. Normal appendix. Vascular/Lymphatic: Aortic atherosclerosis, without evidence of aneurysm or dissection in the abdominal or pelvic vasculature. No lymphadenopathy noted in the abdomen or  pelvis. Reproductive: Prostate gland and seminal vesicles are unremarkable in appearance. Other: No significant volume of ascites.  No pneumoperitoneum. Musculoskeletal: Large lytic lesion in the right side of the S1 vertebra measuring 3.2 x 4.2 cm (axial image 97 of series 4). IMPRESSION: 1. Large centrally necrotic mass in the left upper lobe, likely to reflect treated neoplasm. Given the appearance of this lesion on today's examination, this is favored to reflect a large pulmonary abscess at this time. This is associated with a moderate loculated left pleural effusion, largest component of which is subpulmonic in position. 2. New 2.8 x 1.8 cm right lower lobe pulmonary nodule, multiple right adrenal nodules and masses, and new lytic lesions in the left side of the manubrium and the right S1 vertebra, compatible with widespread metastatic disease. 3. Colonic diverticulosis without evidence of acute diverticulitis at this time. 4. Aortic atherosclerosis, in addition to left main and 3 vessel coronary artery disease. Please note that although  the presence of coronary artery calcium documents the presence of coronary artery disease, the severity of this disease and any potential stenosis cannot be assessed on this non-gated CT examination. Assessment for potential risk factor modification, dietary therapy or pharmacologic therapy may be warranted, if clinically indicated. 5. Additional incidental findings, as above. Electronically Signed   By: Vinnie Langton M.D.   On: 11/30/2018 12:02   Ct Abdomen Pelvis W Contrast  Result Date: 11/30/2018 CLINICAL DATA:  63 year old male with history of non-small cell lung cancer. Staging examination. EXAM: CT CHEST, ABDOMEN, AND PELVIS WITH CONTRAST TECHNIQUE: Multidetector CT imaging of the chest, abdomen and pelvis was performed following the standard protocol during bolus administration of intravenous contrast. CONTRAST:  61mL OMNIPAQUE IOHEXOL 300 MG/ML  SOLN COMPARISON:   PET-CT 04/25/2017. FINDINGS: CT CHEST FINDINGS Cardiovascular: Heart size is mildly enlarged. There is no significant pericardial fluid, thickening or pericardial calcification. There is aortic atherosclerosis, as well as atherosclerosis of the great vessels of the mediastinum and the coronary arteries, including calcified atherosclerotic plaque in the left main, left anterior descending, left circumflex and right coronary arteries. Mediastinum/Nodes: No pathologically enlarged mediastinal or hilar lymph nodes. Esophagus is unremarkable in appearance. No axillary lymphadenopathy. Lungs/Pleura: Previously noted left upper lobe nodule is no longer clearly identifiable. Several fiducial markers are noted in the left upper lobe. On today's examination there is a new 7.8 x 9.5 x 8.3 cm mass-like area (axial image 21 of series 4 and coronal image 105 of series 6) which is centrally hypovascular with multiple internal locules of gas, favored to represent a large centrally necrotic mass, likely with superinfection (i.e., a large pulmonary abscess) with some adjacent postobstructive changes in the remaining portions of the left upper lobe. This is associated with outward bowing of the left major fissure. Some consolidative changes in volume loss are noted in the anterior and lateral aspect of the left lower lobe. Moderate left pleural effusion predominantly loculated in a subpulmonic position. New right lower lobe pulmonary nodule (axial image 131 of series 5) measuring 2.8 x 1.8 cm, concerning for metastatic lesion. No right pleural effusion. Musculoskeletal: Lytic lesion in the left side of the manubrium measuring 2.5 x 1.7 cm (axial image 13 of series 4). CT ABDOMEN PELVIS FINDINGS Hepatobiliary: No suspicious cystic or solid hepatic lesions. No intra or extrahepatic biliary ductal dilatation. Gallbladder is unremarkable in appearance. Pancreas: No pancreatic mass. No pancreatic ductal dilatation. No pancreatic or  peripancreatic fluid or inflammatory changes. Spleen: Unremarkable. Adrenals/Urinary Tract: Multiple new right adrenal nodules and masses, compatible with metastatic disease, largest of which measures 2.8 x 3.8 cm (axial image 71 of series 4). Left adrenal gland is normal in appearance. Left kidney is normal in appearance. Multiple low-attenuation lesions in the right kidney, compatible with simple cysts measuring up to 4.2 cm in the interpolar region. In the lower pole of the right kidney there is also an exophytic intermediate attenuation lesion which is incompletely characterized, but similar to prior PET-CT 04/25/2017, favored to represent a mildly proteinaceous/hemorrhagic cyst. No hydroureteronephrosis. Urinary bladder is normal in appearance. Stomach/Bowel: Normal appearance of the stomach. No pathologic dilatation of small bowel or colon. Numerous colonic diverticulae are noted, particularly in the proximal sigmoid colon, without surrounding inflammatory changes to suggest an acute diverticulitis at this time. Normal appendix. Vascular/Lymphatic: Aortic atherosclerosis, without evidence of aneurysm or dissection in the abdominal or pelvic vasculature. No lymphadenopathy noted in the abdomen or pelvis. Reproductive: Prostate gland and seminal vesicles are unremarkable in  appearance. Other: No significant volume of ascites.  No pneumoperitoneum. Musculoskeletal: Large lytic lesion in the right side of the S1 vertebra measuring 3.2 x 4.2 cm (axial image 97 of series 4). IMPRESSION: 1. Large centrally necrotic mass in the left upper lobe, likely to reflect treated neoplasm. Given the appearance of this lesion on today's examination, this is favored to reflect a large pulmonary abscess at this time. This is associated with a moderate loculated left pleural effusion, largest component of which is subpulmonic in position. 2. New 2.8 x 1.8 cm right lower lobe pulmonary nodule, multiple right adrenal nodules and  masses, and new lytic lesions in the left side of the manubrium and the right S1 vertebra, compatible with widespread metastatic disease. 3. Colonic diverticulosis without evidence of acute diverticulitis at this time. 4. Aortic atherosclerosis, in addition to left main and 3 vessel coronary artery disease. Please note that although the presence of coronary artery calcium documents the presence of coronary artery disease, the severity of this disease and any potential stenosis cannot be assessed on this non-gated CT examination. Assessment for potential risk factor modification, dietary therapy or pharmacologic therapy may be warranted, if clinically indicated. 5. Additional incidental findings, as above. Electronically Signed   By: Vinnie Langton M.D.   On: 11/30/2018 12:02   Dg Chest Port 1 View  Result Date: 12/05/2018 CLINICAL DATA:  Pleural effusion. EXAM: PORTABLE CHEST 1 VIEW COMPARISON:  CT scan and radiograph of November 30, 2018. FINDINGS: Stable near complete opacification of left hemithorax is noted, with stable aerated lung seen in the left lower lobe. This is most consistent with pneumonia or atelectasis with associated pleural effusion. Mild mediastinal shift from right to left is noted. Atherosclerosis of thoracic aorta is noted. No pneumothorax is noted. Bony thorax is unremarkable. IMPRESSION: Stable left lung findings as described above. Aortic Atherosclerosis (ICD10-I70.0). Electronically Signed   By: Marijo Conception M.D.   On: 12/05/2018 07:41   Nm Scan Tumor Localize With Spect  Result Date: 11/25/2018 CLINICAL DATA:  HEART FAILURE. CONCERN FOR CARDIAC AMYLOIDOSIS. EXAM: NUCLEAR MEDICINE TUMOR LOCALIZATION. PYP CARDIAC AMYLOIDOSIS SCAN WITH SPECT TECHNIQUE: Following intravenous administration of radiopharmaceutical, anterior planar images of the chest were obtained. Regions of interest were placed on the heart and contralateral chest wall for quantitative assessment. Additional SPECT  imaging of the chest was obtained. RADIOPHARMACEUTICALS:  21.7 mCi TECHNETIUM 99 PYROPHOSPHATE FINDINGS: Planar Visual assessment: Anterior planar imaging demonstrates radiotracer uptake within the heart less than than uptake within the adjacent ribs (Grade 1). Quantitative assessment : Quantitative assessment of the cardiac uptake compared to the contralateral chest wall is equal to 1.0 (H/CL = 1.0). SPECT assessment: SPECT imaging of the chest demonstrates minimal radiotracer accumulation within the LEFT ventricle. IMPRESSION: Visual and quantitative assessment (grade 1, H/CLL equal 1.0) are NOT suggestive of transthyretin amyloidosis. Electronically Signed   By: Suzy Bouchard M.D.   On: 11/25/2018 14:33   Ir Thoracentesis Asp Pleural Space W/img Guide  Result Date: 12/11/2018 INDICATION: Patient with history of lung cancer (large centrally necrotic mass in LUL), dyspnea, and recurrent left pleural effusion. Request is made for therapeutic left thoracentesis. EXAM: ULTRASOUND GUIDED THERAPEUTIC LEFT THORACENTESIS MEDICATIONS: 10 mL 1% lidocaine COMPLICATIONS: None immediate. PROCEDURE: An ultrasound guided thoracentesis was thoroughly discussed with the patient and questions answered. The benefits, risks, alternatives and complications were also discussed. The patient understands and wishes to proceed with the procedure. Written consent was obtained. Ultrasound was performed to localize and mark an  adequate pocket of fluid in the left chest. The area was then prepped and draped in the normal sterile fashion. 1% Lidocaine was used for local anesthesia. Under ultrasound guidance a 6 Fr Safe-T-Centesis catheter was introduced. Thoracentesis was performed. The catheter was removed and a dressing applied. FINDINGS: A total of approximately 200 mL of clear gold fluid was removed. Procedure was stopped after 200 mL due to complexity of pleural effusion. IMPRESSION: Successful ultrasound guided left thoracentesis  yielding 200 mL of pleural fluid. Read by: Earley Abide, PA-C Electronically Signed   By: Jerilynn Mages.  Shick M.D.   On: 12/11/2018 14:56   Ir Thoracentesis Asp Pleural Space W/img Guide  Result Date: 12/06/2018 INDICATION: History of lung cancer. Recurrent left-sided pleural effusion. Request therapeutic thoracentesis. EXAM: ULTRASOUND GUIDED LEFT THORACENTESIS MEDICATIONS: None. COMPLICATIONS: None immediate. PROCEDURE: An ultrasound guided thoracentesis was thoroughly discussed with the patient and questions answered. The benefits, risks, alternatives and complications were also discussed. The patient understands and wishes to proceed with the procedure. Written consent was obtained. Ultrasound of the left chest demonstrates that the effusion has become severely loculated in the interval since previous procedure. Ultrasound was performed to localize and mark an adequate pocket of fluid in the left chest. The area was then prepped and draped in the normal sterile fashion. 1% Lidocaine was used for local anesthesia. Under ultrasound guidance a 6 Fr Safe-T-Centesis catheter was introduced. Thoracentesis was performed. The catheter was removed and a dressing applied. FINDINGS: A total of approximately 800 mL of clear, dark yellow fluid was removed. IMPRESSION: Successful ultrasound guided left thoracentesis yielding 800 mL of pleural fluid. Left pleural effusion has become markedly loculated in the interval. Read by: Ascencion Dike PA-C Electronically Signed   By: Markus Daft M.D.   On: 12/06/2018 10:40   Ir Thoracentesis Asp Pleural Space W/img Guide  Result Date: 11/21/2018 INDICATION: Patient with history of lung cancer, now with pleural effusion. Request is made for diagnostic and therapeutic left thoracentesis. EXAM: ULTRASOUND GUIDED DIAGNOSTIC AND THERAPEUTIC LEFT THORACENTESIS MEDICATIONS: 10 mL 1% lidocaine COMPLICATIONS: None immediate. PROCEDURE: An ultrasound guided thoracentesis was thoroughly discussed  with the patient and questions answered. The benefits, risks, alternatives and complications were also discussed. The patient understands and wishes to proceed with the procedure. Written consent was obtained. Ultrasound was performed to localize and mark an adequate pocket of fluid in the left chest. The area was then prepped and draped in the normal sterile fashion. 1% Lidocaine was used for local anesthesia. Under ultrasound guidance a 6 Fr Safe-T-Centesis catheter was introduced. Thoracentesis was performed. The catheter was removed and a dressing applied. FINDINGS: A total of approximately 1.3 liters of yellow fluid was removed. Samples were sent to the laboratory as requested by the clinical team. IMPRESSION: Successful ultrasound guided diagnostic and therapeutic left thoracentesis yielding 1.3 liters of pleural fluid. Read by: Brynda Greathouse PA-C Electronically Signed   By: Markus Daft M.D.   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 Noted to have recurrent left pleural effusion - last thoracentesis 11/21/18 yielding 1.3 L pleural fluid. Request has been made to IR for diagnostic and therapeutic thoracentesis. EXAM: ULTRASOUND GUIDED LEFT THORACENTESIS MEDICATIONS: 8 mL 1% lidocaine. COMPLICATIONS: None immediate. PROCEDURE: An ultrasound guided thoracentesis was  thoroughly discussed with the patient and questions answered. The benefits, risks, alternatives and complications were also discussed. The patient understands and wishes to proceed with the procedure. Written consent was obtained. Ultrasound was performed to localize and mark an adequate pocket of fluid in the left chest. The area was then prepped and draped in the normal sterile fashion. 1%  Lidocaine was used for local anesthesia. Under ultrasound guidance a 6 Fr Safe-T-Centesis catheter was introduced. Thoracentesis was performed. The catheter was removed and a dressing applied. FINDINGS: A total of approximately 1.9 L of golden yellow fluid was removed. Procedure was aborted due to patient complaints of chest pain - some residual fluid remains on post procedure ultrasound. samples were sent to the laboratory as requested by the clinical team. IMPRESSION: Successful ultrasound guided left thoracentesis yielding 1.9 L of pleural fluid. Read by Candiss Norse, PA-C Electronically Signed   By: Aletta Edouard M.D.   On: 11/30/2018 10:51      LAB RESULTS: Basic Metabolic Panel: Recent Labs  Lab 12/12/18 0018 12/13/18 0255  NA 138 135  K 3.9 4.1  CL 103 96*  CO2 27 28  GLUCOSE 121* 110*  BUN 28* 29*  CREATININE 1.06 1.28*  CALCIUM 8.4* 8.5*   Liver Function Tests: Recent Labs  Lab 12/11/18 0818  AST 39  ALT 31  ALKPHOS 220*  BILITOT 0.7  PROT 6.6  ALBUMIN 1.5*   No results for input(s): LIPASE, AMYLASE in the last 168 hours. No results for input(s): AMMONIA in the last 168 hours. CBC: Recent Labs  Lab 12/12/18 0018 12/13/18 0255  WBC 9.1 9.3  NEUTROABS 7.9*  --   HGB 8.1* 8.0*  HCT 25.9* 25.7*  MCV 79.9* 80.8  PLT 351 350   Cardiac Enzymes: Recent Labs  Lab 12/11/18 1816 12/12/18 0018  TROPONINI 1.34* 1.27*   BNP: Invalid input(s): POCBNP CBG: Recent Labs  Lab 12/12/18 2046 12/13/18 0650  GLUCAP 162* 110*      Disposition and Follow-up: Discharge Instructions    (HEART FAILURE PATIENTS) Call MD:  Anytime you have any of the following symptoms: 1) 3 pound weight gain in 24 hours or 5 pounds in 1 week 2) shortness of breath, with or without a dry hacking cough 3) swelling in the hands, feet or stomach 4) if you have to sleep on extra pillows at night in order to breathe.   Complete by:  As directed    Diet - low sodium heart healthy    Complete by:  As directed    Increase activity slowly   Complete by:  As directed        DISPOSITION: To the motel with [facility] [facility], home health PT and RN   DISCHARGE FOLLOW-UP Follow-up Information    Biagio Borg, MD. Schedule an appointment as soon as possible for a visit in 1 week(s).   Specialties:  Internal Medicine, Radiology Why:  or tele visit Contact information: [place] [address] [phone]        Curt Bears, MD. Schedule an appointment as soon as possible for a visit.   Specialty:  Oncology Contact information: [place] [address] [phone]        [facility], [facility] Follow up.   Why:  Registered Nurse. Physical Therapy, Occupational Therapy, Aide. Social Worker- office to call [employer] and set up time for a visit.  Contact information: [place] [address] [phone]  Time coordinating discharge:  40 minutes  Signed:   Estill Cotta M.D. [facility] 12/13/2018, 4:32 PM

## 2018-12-13 NOTE — Consult Note (Signed)
CARDIOLOGY CONSULT NOTE  Patient ID: Brad [name] MRN: [id number] DOB/AGE: August 17, 1955 63 y.o.  Admit date: 12/11/2018 Referring Physician: [facility] Primary Physician:  Biagio Borg, MD Reason for Consultation: Troponin elevation  HPI:   63 y.o. Caucasian male with hypertension, type 2 DM, PAD s/p Rt CAS and Lt CEA, chronic hep C, COPD, stage III squamous cell cancer (s/p radiation, incomplete chemotherapy), h/o CVA, h/o duodenal ulcers, prior h/o RLE DVT-currently not on anticoagualtion, HFpEF with unremarkable workup for amyloidosis 11/2018.  Patient underwent thoracentesis x2 during last hospitalization.  Cytology was negative for malignant cells. Since I saw the patient on 11/24/2018, he had further work-up including CT chest that showed a large centrally necrotic mass in left upper lobe, suspicious for neoplasm.  There was also a new right lower lobe pulmonary nodule, multiple adrenal nodules, as well as lytic masses in left side of manubrium and vertebrae, compatible with widespread metastatic disease.  Oncology is being consulted regarding further management, given the above findings. CCTS Dr. Servando Snare saw the patient and recommended against pleurex catheter placement  He is now admitted to the hospital with shortness of breath and generalized weakness.  Work-up was significant for BNP elevation 1900, troponin elevation 1.34.     Past Medical History:  Diagnosis Date  . Anemia    takes Ferrous Sulfated daily  . Arthritis   . Carotid stenosis 08/27/2014   takes Aspirin daily  . Chronic hepatitis C ([place]) 09/17/2015  . COPD (chronic obstructive pulmonary disease) (HCC)    Albuterol daily as needed and Spiriva daily  . Depression   . Diabetes mellitus type II    takes Metformin and Glipizide daily  . Dizziness   . GERD (gastroesophageal reflux disease)   . GI bleed 12/2015  . Headache    occasionally  . History of blood clots    in right leg;being followed by  Dr.Brabham for this  . History of blood transfusion 09/2014   2 units-no abnormal reaction noted  . History of gastric ulcer   . History of hiatal hernia   . Hyperlipidemia 08/27/2014   takes Simvastatin daily  . Hypertension    takes Amlodipine and Lisinopril daily  . Hypothyroidism 08/27/2014   takes Synthroid daily  . Insomnia    takes Pamelor nightly  . Joint pain    hips  . Muscle spasm    takes Zanaflex daily as needed  . MVP (mitral valve prolapse)   . Peripheral edema    takes Furosemide daily as needed  . Peripheral neuropathy   . Pleural effusion 11/2018  . Prurigo nodularis   . Stroke [facility])      Past Surgical History:  Procedure Laterality Date  . ENDARTERECTOMY Left 11/13/2014   Procedure: ENDARTERECTOMY CAROTID WITH PATCH ANGIOPLASTY;  Surgeon: Serafina Mitchell, MD;  Location: [facility] OR;  Service: Vascular;  Laterality: Left;  . ESOPHAGOGASTRODUODENOSCOPY N/A 09/24/2014   Procedure: ESOPHAGOGASTRODUODENOSCOPY (EGD);  Surgeon: Winfield Cunas., MD;  Location[facility] ENDOSCOPY;  Service: Endoscopy;  Laterality: N/A;  . FUDUCIAL PLACEMENT N/A 06/26/2017   Procedure: PLACEMENT OF FUDUCIAL;  Surgeon: Grace Isaac, MD;  Location: [place];  Service: Thoracic;  Laterality: N/A;  . I&D of right arm    . INCISION AND DRAINAGE PERIRECTAL ABSCESS  03/2009  . IR THORACENTESIS ASP PLEURAL SPACE W/IMG GUIDE  11/21/2018  . IR THORACENTESIS ASP PLEURAL SPACE W/IMG GUIDE  12/06/2018  . IR THORACENTESIS ASP PLEURAL SPACE W/IMG GUIDE  12/11/2018  .  Neg Stress Test  2000  . PERIPHERAL VASCULAR CATHETERIZATION N/A 11/30/2015   Procedure: Carotid PTA/Stent Intervention;  Surgeon: Serafina Mitchell, MD;  Location: [place] CV LAB;  Service: Cardiovascular;  Laterality: N/A;  . ROTATOR CUFF REPAIR Right 2009   multiple f/u Sxs/infection Tx  . VASCULAR SURGERY    . VIDEO BRONCHOSCOPY WITH ENDOBRONCHIAL NAVIGATION N/A 05/25/2017   Procedure: VIDEO BRONCHOSCOPY WITH ENDOBRONCHIAL NAVIGATION;   Surgeon: Rigoberto Noel, MD;  Location: [place];  Service: Thoracic;  Laterality: N/A;  . VIDEO BRONCHOSCOPY WITH ENDOBRONCHIAL NAVIGATION N/A 06/26/2017   Procedure: VIDEO BRONCHOSCOPY WITH ENDOBRONCHIAL NAVIGATION WITH PLACEMENT OF FIDUCIALS;  Surgeon: Grace Isaac, MD;  Location: [place];  Service: Thoracic;  Laterality: N/A;  . VIDEO BRONCHOSCOPY WITH ENDOBRONCHIAL ULTRASOUND N/A 05/25/2017   Procedure: VIDEO BRONCHOSCOPY WITH ENDOBRONCHIAL ULTRASOUND;  Surgeon: Rigoberto Noel, MD;  Location: [facility] OR;  Service: Thoracic;  Laterality: N/A;     Family History  Problem Relation Age of Onset  . Heart disease Mother        automated implantable cardioverter-defibrillator   . Diabetes type II Mother   . Alcohol abuse Father   . Lung cancer Father   . Lung cancer Maternal Grandmother   . Lung cancer Sister      Social History: Social History   Socioeconomic History  . Marital status: Single    Spouse name: Not on file  . Number of children: 0  . Years of education: Not on file  . Highest education level: Not on file  Occupational History  . Occupation: Disabled  Social Needs  . Financial resource strain: Not on file  . Food insecurity:    Worry: Not on file    Inability: Not on file  . Transportation needs:    Medical: Not on file    Non-medical: Not on file  Tobacco Use  . Smoking status: Current Every Day Smoker    Packs/day: 1.00    Years: 48.00    Pack years: 48.00    Types: Cigarettes  . Smokeless tobacco: Never Used  . Tobacco comment: 1-2 pks per day  Substance and Sexual Activity  . Alcohol use: Not Currently    Alcohol/week: 1.0 standard drinks    Types: 1 Cans of beer per week    Comment: rarely  . Drug use: No  . Sexual activity: Yes  Lifestyle  . Physical activity:    Days per week: Not on file    Minutes per session: Not on file  . Stress: Not on file  Relationships  . Social connections:    Talks on phone: Not on file    Gets together: Not on  file    Attends religious service: Not on file    Active member of club or organization: Not on file    Attends meetings of clubs or organizations: Not on file    Relationship status: Not on file  . Intimate partner violence:    Fear of current or ex partner: Not on file    Emotionally abused: Not on file    Physically abused: Not on file    Forced sexual activity: Not on file  Other Topics Concern  . Not on file  Social History Narrative   Divorced with fiance.   Disabled since June 2010 - right shoulder   No children.           Medications Prior to Admission  Medication Sig Dispense Refill Last Dose  .  amLODipine (NORVASC) 5 MG tablet Take 1 tablet (5 mg total) by mouth daily for 30 days. 30 tablet 0   . aspirin EC 81 MG tablet Take 1 tablet (81 mg total) by mouth daily. (Patient not taking: Reported on 11/25/2018) 90 tablet 11 Not Taking at Unknown time  . Blood Pressure Monitor DEVI Use to check Blood pressure daily (Patient not taking: Reported on 11/25/2018) 1 Device 0 Not Taking at Unknown time  . ferrous sulfate 325 (65 FE) MG tablet Take 1 tablet (325 mg total) by mouth 3 (three) times daily with meals. (Patient not taking: Reported on 11/25/2018) 30 tablet 3 Not Taking at Unknown time  . furosemide (LASIX) 80 MG tablet Take 1 tablet (80 mg total) by mouth daily for 30 days. (Patient not taking: Reported on 11/30/2018) 90 tablet 3 Not Taking at Unknown time  . glucose blood (ACCU-CHEK AVIVA PLUS) test strip Use to check blood sugars daily Dx E11.9 (Patient not taking: Reported on 11/25/2018) 100 each 3 Not Taking at Unknown time  . levothyroxine (SYNTHROID, LEVOTHROID) 112 MCG tablet Take 1 tablet (112 mcg total) daily by mouth. (Patient not taking: Reported on 11/25/2018) 90 tablet 3 Not Taking at Unknown time  . metoprolol succinate (TOPROL-XL) 25 MG 24 hr tablet Take 1 tablet (25 mg total) by mouth daily for 30 days. (Patient not taking: Reported on 11/30/2018) 30 tablet 0 Not  Taking at Unknown time  . pantoprazole (PROTONIX) 40 MG tablet Take 1 tablet (40 mg total) by mouth at bedtime for 30 days. (Patient not taking: Reported on 11/30/2018) 30 tablet 0 Not Taking at Unknown time  . umeclidinium-vilanterol (ANORO ELLIPTA) 62.5-25 MCG/INH AEPB Inhale 1 puff into the lungs daily. (Patient not taking: Reported on 11/25/2018) 1 each 3 Not Taking at Unknown time    Review of Systems  Constitution: Positive for malaise/fatigue.  Cardiovascular: Positive for leg swelling. Negative for chest pain.  Respiratory: Positive for shortness of breath.       Physical Exam: Physical Exam  Constitutional: He is oriented to person, place, and time. He appears well-developed and well-nourished. No distress.  HENT:  Head: Normocephalic and atraumatic.  Eyes: Pupils are equal, round, and reactive to light. Conjunctivae are normal.  Neck: No JVD present.  Cardiovascular: Normal rate and regular rhythm.  Murmur heard.  Systolic murmur is present with a grade of 3/6 at the upper right sternal border. Pulses:      Carotid pulses are on the right side with bruit and on the left side with bruit. Pulmonary/Chest: Effort normal. He has no wheezes. He has no rales.  Diminished LLL  Abdominal: Soft. Bowel sounds are normal. There is no rebound.  Musculoskeletal:        General: Edema (1+ b/l) present.  Lymphadenopathy:    He has no cervical adenopathy.  Neurological: He is alert and oriented to person, place, and time. No cranial nerve deficit.  Skin: Skin is warm and dry.  Psychiatric: He has a normal mood and affect.  Nursing note and vitals reviewed.     Labs:   Lab Results  Component Value Date   WBC 9.3 12/13/2018   HGB 8.0 (L) 12/13/2018   HCT 25.7 (L) 12/13/2018   MCV 80.8 12/13/2018   PLT 350 12/13/2018    Recent Labs  Lab 12/11/18 0818  12/13/18 0255  NA 133*   < > 135  K 3.6   < > 4.1  CL 101   < > 96*  CO2 22   < > 28  BUN 28*   < > 29*  CREATININE 1.16    < > 1.28*  CALCIUM 8.2*   < > 8.5*  PROT 6.6  --   --   BILITOT 0.7  --   --   ALKPHOS 220*  --   --   ALT 31  --   --   AST 39  --   --   GLUCOSE 131*   < > 110*   < > = values in this interval not displayed.    Lipid Panel     Component Value Date/Time   CHOL 90 11/21/2018 1420   TRIG 45 11/21/2018 1420   HDL 28 (L) 11/21/2018 1420   CHOLHDL 3.2 11/21/2018 1420   VLDL 9 11/21/2018 1420   LDLCALC 53 11/21/2018 1420    BNP (last 3 results) Recent Labs    11/20/18 2034 11/30/18 0018 12/11/18 0818  BNP 1,440.6* 717.8* 1,993.5*    HEMOGLOBIN A1C Lab Results  Component Value Date   HGBA1C 6.2 (H) 11/20/2018   MPG 131.24 11/20/2018    Cardiac Panel (last 3 results) Recent Labs    12/11/18 1206 12/11/18 1816 12/12/18 0018  TROPONINI 0.86* 1.34* 1.27*    Lab Results  Component Value Date   TROPONINI 1.27 (HH) 12/12/2018     TSH Recent Labs    11/20/18 2241  TSH 13.206*      Radiology: Dg Chest 1 View  Result Date: 12/11/2018 CLINICAL DATA:  Status post left thoracentesis. EXAM: CHEST  1 VIEW COMPARISON:  PA and lateral chest 12/11/2018. FINDINGS: No pneumothorax is identified. Extensive opacity throughout the left chest appears worse than on the prior exam. The right lung is clear. Cardiac silhouette is obscured. IMPRESSION: Negative for pneumothorax after thoracentesis. Extensive opacity throughout the left chest appears worse than on the comparison study. Electronically Signed   By: Inge Rise M.D.   On: 12/11/2018 14:51   Ct Head Wo Contrast  Result Date: 12/12/2018 CLINICAL DATA:  Transient blurred vision. History of widespread metastatic lung cancer. EXAM: CT HEAD WITHOUT CONTRAST TECHNIQUE: Contiguous axial images were obtained from the base of the skull through the vertex without intravenous contrast. COMPARISON:  Brain MRI 06/23/2017 FINDINGS: Brain: There is no evidence of acute infarct, intracranial hemorrhage, mass, midline shift, or  extra-axial fluid collection. Chronic infarcts are again seen in the basal ganglia bilaterally, thalami, and cerebellum. There is mild cerebral atrophy. Patchy cerebral white matter hypodensities are nonspecific but compatible with mild-to-moderate chronic small vessel ischemic disease. There is no evidence of vasogenic edema. Vascular: Calcified atherosclerosis at the skull base. No hyperdense vessel. Skull: No fracture or focal osseous lesion. Sinuses/Orbits: Unremarkable orbits. Small left maxillary sinus mucous retention cyst. Chronic trace left mastoid effusion. Other: None. IMPRESSION: 1. No evidence of acute intracranial abnormality or metastatic disease on this unenhanced study. 2. Chronic ischemic changes as above. Electronically Signed   By: Logan Bores M.D.   On: 12/12/2018 15:57   Ir Thoracentesis Asp Pleural Space W/img Guide  Result Date: 12/11/2018 INDICATION: Patient with history of lung cancer (large centrally necrotic mass in LUL), dyspnea, and recurrent left pleural effusion. Request is made for therapeutic left thoracentesis. EXAM: ULTRASOUND GUIDED THERAPEUTIC LEFT THORACENTESIS MEDICATIONS: 10 mL 1% lidocaine COMPLICATIONS: None immediate. PROCEDURE: An ultrasound guided thoracentesis was thoroughly discussed with the patient and questions answered. The benefits, risks, alternatives and complications were also discussed. The patient understands and wishes to proceed with  the procedure. Written consent was obtained. Ultrasound was performed to localize and mark an adequate pocket of fluid in the left chest. The area was then prepped and draped in the normal sterile fashion. 1% Lidocaine was used for local anesthesia. Under ultrasound guidance a 6 Fr Safe-T-Centesis catheter was introduced. Thoracentesis was performed. The catheter was removed and a dressing applied. FINDINGS: A total of approximately 200 mL of clear gold fluid was removed. Procedure was stopped after 200 mL due to complexity  of pleural effusion. IMPRESSION: Successful ultrasound guided left thoracentesis yielding 200 mL of pleural fluid. Read by: Earley Abide, PA-C Electronically Signed   By: Jerilynn Mages.  Shick M.D.   On: 12/11/2018 14:56    Scheduled Meds: . amLODipine  5 mg Oral Daily  . aspirin EC  81 mg Oral Daily  . ferrous sulfate  325 mg Oral TID WC  . furosemide  40 mg Intravenous BID  . levothyroxine  112 mcg Oral Daily  . metoprolol succinate  25 mg Oral Daily  . nicotine  21 mg Transdermal Daily  . pantoprazole  40 mg Oral QHS  . potassium chloride  40 mEq Oral Daily  . sodium chloride flush  3 mL Intravenous Q12H  . umeclidinium-vilanterol  1 puff Inhalation Daily   Continuous Infusions: . sodium chloride     PRN Meds:.sodium chloride, acetaminophen, ipratropium-albuterol, lidocaine (PF), ondansetron (ZOFRAN) IV, sodium chloride flush  CARDIAC STUDIES:  EKG 12/13/2018: Sinus rhythm 82 bpm. IVCD. Lateral T wave inversions, likely related to LVH. Cannot exclude ischemia.   PYP study: Visual and quantitative assessment (grade 1, H/CLL equal 1.0) are NOT suggestive of transthyretin amyloidosis.  Echocardiogram 11/21/2018: (My independent read) Severe LVH. LVEF around 50%. Restrictive physiology Grade 3 DD Mod biatrial enlargement Mod MAC (Could be affecting diastolic function assessment) Large left sided pleural effusion.  RA pressure 10-15 mmHg   Assessment & Recommendations:  63 y.o. Caucasian male with hypertension, type 2 DM, PAD s/p Rt CAS and Lt CEA, chronic hep C, COPD, stage III squamous cell cancer (s/p radiation, incomplete chemotherapy), h/o CVA, h/o duodenal ulcers, prior h/o RLE DVT-currently not on anticoagualtion, HFpEF, suspected stage IV lung carcinoma, now admitted with shortness of breath and fatigue. Cardiology consulted for troponin elevation  Troponin elevation: Could either by type 1 or type 2 MI in the setting of HFpEF. He likely has underlying CAD. Unfortunately, his  primary issue seems to be suspected IV lung cancer at this time, with near complete opacification of left lung, and metastatic lesions. Given his advanced malignancy, anemia w/h/o duodenal ulcers, he is not a candidate for any aggressive invasive cardiac management. Conitnue Aspirin, if tolerated, statin, metoprolol, amlodipine as you are doing.   HFpEF: Workup negative for amyloidosis. Likely hypertensive cardiomyopathy.  Agree with lasix as you are doing  Nigel Mormon, MD 12/13/2018, 11:11 AM [facility]. [place] Pager: [phone] Office: [phone] If no answer Cell [phone]

## 2018-12-13 NOTE — NC FL2 (Signed)
[place] LEVEL OF CARE SCREENING TOOL     IDENTIFICATION  Patient Name: Brad [name] Birthdate: 06/15/1956 Sex: male Admission Date (Current Location): 12/11/2018  [place] and [place] Number:  Kathleen Argue [id number] Facility and Address:  [facility] [place] [place] [address] [place], [place] [address]      Provider Number: [id number]  Attending Physician Name and Address:  Mendel Corning, MD  Relative Name and Phone Number:  (Patient will be contact)    Current Level of Care: Hospital Recommended Level of Care: [place] Prior Approval Number:    Date Approved/Denied:   PASRR Number:    Discharge Plan: Other (Comment)(Family Care Home)    Current Diagnoses: Patient Active Problem List   Diagnosis Date Noted  . Palliative care by specialist   . SOB (shortness of breath) 12/11/2018  . Elevated troponin 12/11/2018  . Pleural effusion associated with pulmonary infection   . Lung cancer ([place]) 12/08/2018  . Weakness generalized 11/30/2018  . Chronic combined systolic and diastolic CHF (congestive heart failure) ([place]) 11/30/2018  . AKI (acute kidney injury) ([place]) 11/26/2018  . Acute exacerbation of CHF (congestive heart failure) ([place]) 11/21/2018  . Encounter for antineoplastic chemotherapy 08/27/2017  . Stage III squamous cell carcinoma of left lung ([place]) 06/29/2017  . Mediastinal lymphadenopathy   . Pleural effusion   . Lung mass 04/27/2017  . Abnormal chest x-ray 04/13/2017  . Loss of weight 05/10/2016  . Grief reaction 05/10/2016  . Preop exam for internal medicine 05/10/2016  . Liver fibrosis 03/09/2016  . Smoker 01/23/2016  . Peripheral vascular disease ([place]) 01/19/2016  . GI bleed 12/08/2015  . Anemia of chronic disease 12/08/2015  . Diastolic CHF ([place]) 02/40/9735  . Hypersomnolence 12/07/2015  . Carotid stenosis, symptomatic, with infarction ([place]) 11/30/2015  . HLD (hyperlipidemia) 11/12/2015  . S/P carotid  endarterectomy 11/12/2015  . Secondary cardiomyopathy ([place]) 10/14/2015  . Acute CVA (cerebrovascular accident) ([place]) 09/28/2015  . Stroke (cerebrum) ([place]) 09/27/2015  . Facial droop 09/27/2015  . Chronic hepatitis C ([place]) 09/17/2015  . Skin lesion 09/14/2015  . Insect bite, infected 09/14/2015  . Left shoulder pain 06/08/2015  . Hypoalbuminemia 02/17/2015  . Elevated alkaline phosphatase level 02/17/2015  . Essential hypertension 02/16/2015  . Carotid stenosis 11/13/2014  . Atherosclerotic peripheral vascular disease with intermittent claudication ([place])   . Duodenal ulcer disease   . Anemia   . Tobacco abuse 09/23/2014  . Hypothyroidism 08/27/2014  . Bilateral carotid artery stenosis   . Diabetes ([place]) 05/25/2014  . Hypertensive heart disease   . History of CVA (cerebrovascular accident) without residual deficits   . Obesity (BMI 30-39.9) 01/19/2014  . Erectile dysfunction 10/10/2013  . Chronic LBP   . COPD (chronic obstructive pulmonary disease) ([place]) 03/17/2010  . GERD 03/17/2010    Orientation RESPIRATION BLADDER Height & Weight     Self, Time, Situation, Place  Normal Continent Weight: 84.9 kg Height:     BEHAVIORAL SYMPTOMS/MOOD NEUROLOGICAL BOWEL NUTRITION STATUS      Continent Diet(Heart Healthy)  AMBULATORY STATUS COMMUNICATION OF NEEDS Skin   Limited Assist(min assist) Verbally Normal                       Personal Care Assistance Level of Assistance  Bathing, Feeding, Dressing, Total care Bathing Assistance: Limited assistance(min assist) Feeding assistance: Limited assistance(min assist) Dressing Assistance: Limited assistance(min assist) Total Care Assistance: Limited assistance   Functional Limitations Info  Hearing, Speech, Sight  Sight Info: Adequate Hearing Info: Adequate Speech Info: Adequate    SPECIAL CARE FACTORS FREQUENCY  PT (By licensed PT), OT (By licensed OT)     PT Frequency: 2-3 x week OT Frequency: 2-3 x week             Contractures Contractures Info: Not present    Additional Factors Info  Code Status, Allergies, Psychotropic, Insulin Sliding Scale Code Status Info: DNR Allergies Info: (lipitor, doxycycline, oxycodone) Psychotropic Info: (N/A) Insulin Sliding Scale Info: (N/A)       Current Medications (12/13/2018):  This is the current hospital active medication list Current Facility-Administered Medications  Medication Dose Route Frequency Provider Last Rate Last Dose  . 0.9 %  sodium chloride infusion  250 mL Intravenous PRN Fuller Plan A, MD      . acetaminophen (TYLENOL) tablet 650 mg  650 mg Oral Q4H PRN Smith, Rondell A, MD      . amLODipine (NORVASC) tablet 5 mg  5 mg Oral Daily Smith, Rondell A, MD   5 mg at 12/12/18 1052  . aspirin EC tablet 81 mg  81 mg Oral Daily Smith, Rondell A, MD   81 mg at 12/12/18 1052  . ferrous sulfate tablet 325 mg  325 mg Oral TID WC Smith, Rondell A, MD   325 mg at 12/12/18 1633  . furosemide (LASIX) injection 40 mg  40 mg Intravenous BID Fuller Plan A, MD   40 mg at 12/12/18 1633  . ipratropium-albuterol (DUONEB) 0.5-2.5 (3) MG/3ML nebulizer solution 3 mL  3 mL Nebulization Q6H PRN Smith, Rondell A, MD      . levothyroxine (SYNTHROID) tablet 112 mcg  112 mcg Oral Daily Fuller Plan A, MD   112 mcg at 12/12/18 1052  . lidocaine (PF) (XYLOCAINE) 1 % injection    PRN Louk, Alexandra M, PA-C   5 mL at 12/11/18 1359  . metoprolol succinate (TOPROL-XL) 24 hr tablet 25 mg  25 mg Oral Daily Smith, Rondell A, MD   25 mg at 12/12/18 1052  . nicotine (NICODERM CQ - dosed in mg/24 hours) patch 21 mg  21 mg Transdermal Daily Tamala Julian, Rondell A, MD   21 mg at 12/12/18 1059  . ondansetron (ZOFRAN) injection 4 mg  4 mg Intravenous Q6H PRN Smith, Rondell A, MD      . pantoprazole (PROTONIX) EC tablet 40 mg  40 mg Oral QHS Smith, Rondell A, MD   40 mg at 12/12/18 2217  . potassium chloride SA (K-DUR) CR tablet 40 mEq  40 mEq Oral Daily Tamala Julian, Rondell A, MD   40 mEq at  12/12/18 1052  . sodium chloride flush (NS) 0.9 % injection 3 mL  3 mL Intravenous Q12H Smith, Rondell A, MD   3 mL at 12/12/18 2218  . sodium chloride flush (NS) 0.9 % injection 3 mL  3 mL Intravenous PRN Smith, Rondell A, MD      . umeclidinium-vilanterol (ANORO ELLIPTA) 62.5-25 MCG/INH 1 puff  1 puff Inhalation Daily Fuller Plan A, MD   1 puff at 12/13/18 0806     Discharge Medications: Please see discharge summary for a list of discharge medications.  Relevant Imaging Results:  Relevant Lab Results:   Additional Information [id number] Pt is on house arrest and is a registered sex offender.   Bethena Roys, RN

## 2018-12-13 NOTE — TOC Progression Note (Signed)
Transition of Care [facility]) - Progression Note    Patient Details  Name: MANISH RUGGIERO MRN: [id number] Date of Birth: [birth year]  Transition of Care [facility]) CM/SW Contact  Graves-Bigelow, Ocie Cornfield, RN Phone Number: 12/13/2018, 3:23 PM  Clinical Narrative:  CM did fax patient out to [facility] today and none were accepting new patients or wouldn't accept due to history. CM did speak with patient and he states that the emergency contact in Epic- he can not stay with her. Patient can not return to his brother-n-laws home. Patient does not have a cell phone Pt was just accepted for [employer]. Pt is agreeable to go to [employer].  CM worked with Lessie Dings, congregational nurse and patient will transition to the motel today. Cab will take him to the motel-voucher to be delivered to the staff RN. Motel will not be secured until after 5:00 pm.  Medications will be delivered via [place]. Lessie Dings will work with Jaclyn Shaggy to have meals delivered to the patient. Malachy Mood with [facility] is aware that the patient will transition out of hospital today. CM did call [facility] to make them aware that patient will transition out of hospital today. No further needs from this CM.   Expected Discharge Plan: [place] Barriers to Discharge: No Barriers Identified  Expected Discharge Plan and Services Expected Discharge Plan: [place] In-house Referral: Clinical Social Work Discharge Planning Services: CM Consult   Living arrangements for the past 2 months: (Pt was staying on the front porch of his brother-n-laws home- (homeless) )                 DME Arranged: N/A         HH Arranged: RN, Disease Management, PT, Social Work [employer] Agency: [place] Date HH Agency Contacted: 12/11/18 Time HH Agency Contacted: 67 Representative spoke with at [facility]: [place] ([place]) Interventions     Readmission Risk Interventions Readmission Risk Prevention Plan 12/06/2018 12/04/2018  Transportation Screening - Complete  HRI or [place] - Complete  Social Work Consult for [place] Planning/Counseling Complete -  Palliative Care Screening - Complete  Medication Review Press [employer]) - Complete  Some recent data might be hidden

## 2018-12-16 ENCOUNTER — Other Ambulatory Visit: Payer: Self-pay | Admitting: *Deleted

## 2018-12-16 ENCOUNTER — Ambulatory Visit: Payer: Self-pay | Admitting: Cardiology

## 2018-12-16 DIAGNOSIS — F329 Major depressive disorder, single episode, unspecified: Secondary | ICD-10-CM | POA: Diagnosis not present

## 2018-12-16 DIAGNOSIS — J449 Chronic obstructive pulmonary disease, unspecified: Secondary | ICD-10-CM | POA: Diagnosis not present

## 2018-12-16 DIAGNOSIS — E785 Hyperlipidemia, unspecified: Secondary | ICD-10-CM | POA: Diagnosis not present

## 2018-12-16 DIAGNOSIS — E039 Hypothyroidism, unspecified: Secondary | ICD-10-CM | POA: Diagnosis not present

## 2018-12-16 DIAGNOSIS — D630 Anemia in neoplastic disease: Secondary | ICD-10-CM | POA: Diagnosis not present

## 2018-12-16 DIAGNOSIS — Z8673 Personal history of transient ischemic attack (TIA), and cerebral infarction without residual deficits: Secondary | ICD-10-CM | POA: Diagnosis not present

## 2018-12-16 DIAGNOSIS — G4700 Insomnia, unspecified: Secondary | ICD-10-CM | POA: Diagnosis not present

## 2018-12-16 DIAGNOSIS — I110 Hypertensive heart disease with heart failure: Secondary | ICD-10-CM | POA: Diagnosis not present

## 2018-12-16 DIAGNOSIS — B182 Chronic viral hepatitis C: Secondary | ICD-10-CM | POA: Diagnosis not present

## 2018-12-16 DIAGNOSIS — I6529 Occlusion and stenosis of unspecified carotid artery: Secondary | ICD-10-CM | POA: Diagnosis not present

## 2018-12-16 DIAGNOSIS — F17210 Nicotine dependence, cigarettes, uncomplicated: Secondary | ICD-10-CM | POA: Diagnosis not present

## 2018-12-16 DIAGNOSIS — K219 Gastro-esophageal reflux disease without esophagitis: Secondary | ICD-10-CM | POA: Diagnosis not present

## 2018-12-16 DIAGNOSIS — Z9221 Personal history of antineoplastic chemotherapy: Secondary | ICD-10-CM | POA: Diagnosis not present

## 2018-12-16 DIAGNOSIS — E1140 Type 2 diabetes mellitus with diabetic neuropathy, unspecified: Secondary | ICD-10-CM | POA: Diagnosis not present

## 2018-12-16 DIAGNOSIS — M1990 Unspecified osteoarthritis, unspecified site: Secondary | ICD-10-CM | POA: Diagnosis not present

## 2018-12-16 DIAGNOSIS — C3412 Malignant neoplasm of upper lobe, left bronchus or lung: Secondary | ICD-10-CM | POA: Diagnosis not present

## 2018-12-16 DIAGNOSIS — R42 Dizziness and giddiness: Secondary | ICD-10-CM | POA: Diagnosis not present

## 2018-12-16 DIAGNOSIS — M62838 Other muscle spasm: Secondary | ICD-10-CM | POA: Diagnosis not present

## 2018-12-16 DIAGNOSIS — Z9119 Patient's noncompliance with other medical treatment and regimen: Secondary | ICD-10-CM | POA: Diagnosis not present

## 2018-12-16 DIAGNOSIS — Z86718 Personal history of other venous thrombosis and embolism: Secondary | ICD-10-CM | POA: Diagnosis not present

## 2018-12-16 DIAGNOSIS — I5042 Chronic combined systolic (congestive) and diastolic (congestive) heart failure: Secondary | ICD-10-CM | POA: Diagnosis not present

## 2018-12-16 NOTE — Patient Outreach (Signed)
[place] [facility]) Care Management  12/16/2018  BRALYNN DONADO [birth year] [id number]   Noted that member discharged from hospital on 5/8 to [place] [facility], room 109 through the [place] program.  Per inpatient case manager notes, [facility] was called and provided discharge information for follow up.  Of note, [facility] home health and Lessie Dings (congregational nurse) are also aware of member's disposition.    Call placed to [facility] to follow up with contact with member, spoke with Manus Gunning.  Notified that she tried to contact member prior to readmission without success, will attempt contact and admission to [facility] again today.  This care manager called front desk of Motel, transferred to [facility] room in attempt for telephone assessment post discharge.  No answer, HIPAA compliant voice message left. Will send unsuccessful outreach letter to member at the motel, will follow up within the next 3 business days.  [name], [place], MSN [place] [phone]

## 2018-12-18 ENCOUNTER — Telehealth: Payer: Self-pay | Admitting: Licensed Clinical Social Worker

## 2018-12-18 DIAGNOSIS — I5042 Chronic combined systolic (congestive) and diastolic (congestive) heart failure: Secondary | ICD-10-CM | POA: Diagnosis not present

## 2018-12-18 DIAGNOSIS — J449 Chronic obstructive pulmonary disease, unspecified: Secondary | ICD-10-CM | POA: Diagnosis not present

## 2018-12-18 DIAGNOSIS — B182 Chronic viral hepatitis C: Secondary | ICD-10-CM | POA: Diagnosis not present

## 2018-12-18 DIAGNOSIS — E1140 Type 2 diabetes mellitus with diabetic neuropathy, unspecified: Secondary | ICD-10-CM | POA: Diagnosis not present

## 2018-12-18 DIAGNOSIS — C3412 Malignant neoplasm of upper lobe, left bronchus or lung: Secondary | ICD-10-CM | POA: Diagnosis not present

## 2018-12-18 DIAGNOSIS — I110 Hypertensive heart disease with heart failure: Secondary | ICD-10-CM | POA: Diagnosis not present

## 2018-12-18 NOTE — Telephone Encounter (Signed)
Palliative Care SW left a vm for patient to schedule a Telehealth visit.

## 2018-12-19 ENCOUNTER — Other Ambulatory Visit: Payer: Self-pay | Admitting: *Deleted

## 2018-12-19 DIAGNOSIS — J449 Chronic obstructive pulmonary disease, unspecified: Secondary | ICD-10-CM | POA: Diagnosis not present

## 2018-12-19 DIAGNOSIS — C3412 Malignant neoplasm of upper lobe, left bronchus or lung: Secondary | ICD-10-CM | POA: Diagnosis not present

## 2018-12-19 DIAGNOSIS — B182 Chronic viral hepatitis C: Secondary | ICD-10-CM | POA: Diagnosis not present

## 2018-12-19 DIAGNOSIS — I110 Hypertensive heart disease with heart failure: Secondary | ICD-10-CM | POA: Diagnosis not present

## 2018-12-19 DIAGNOSIS — I5042 Chronic combined systolic (congestive) and diastolic (congestive) heart failure: Secondary | ICD-10-CM | POA: Diagnosis not present

## 2018-12-19 DIAGNOSIS — E1140 Type 2 diabetes mellitus with diabetic neuropathy, unspecified: Secondary | ICD-10-CM | POA: Diagnosis not present

## 2018-12-19 NOTE — Patient Outreach (Signed)
[place] [facility]) Care Management  12/19/2018  Brad [name] Dec 29, 1955 [id number]   Second attempt made to contact member for telephone assessment.  Multiple calls made to [place] [facility] front desk this morning and this afternoon, unable to get through for most calls.  When able to get through, no answer when connected to room.  Noted per chart that congregational nurse and palliative care nurse has also attempted to contact member without success.  Will make third attempt within the next 3-4 business days.  [name], [place], MSN [place] [phone]

## 2018-12-20 ENCOUNTER — Telehealth: Payer: Self-pay | Admitting: Internal Medicine

## 2018-12-20 NOTE — Telephone Encounter (Signed)
Verbal orders given via VM 

## 2018-12-20 NOTE — Telephone Encounter (Signed)
Copied from [place] [id number]. Topic: Quick Communication - Home Health Verbal Orders >> Dec 20, 2018 11:15 AM Pauline Good wrote: Caller/Agency: [facility] Callback Number: [phone] Requesting OT/PT/Skilled Nursing/Social Work/Speech Therapy: PT Frequency: 2x's for 4wks 1x for 2wks  Can leave message but please leave there name when calling

## 2018-12-23 ENCOUNTER — Telehealth: Payer: Self-pay | Admitting: Internal Medicine

## 2018-12-23 NOTE — Telephone Encounter (Signed)
Per 5/14 schedule message from inpatient APP schedule patient for f/u 5/21, however was not able to reach patient or leave message. Phone number in system is to a motel. Patient is not an employee and they are not allowed to provide information on customers.   Appointment moved to 6/1 and schedule mailed to address in system.

## 2018-12-25 ENCOUNTER — Other Ambulatory Visit: Payer: Self-pay | Admitting: *Deleted

## 2018-12-25 ENCOUNTER — Telehealth: Payer: Self-pay | Admitting: Internal Medicine

## 2018-12-25 DIAGNOSIS — Z86718 Personal history of other venous thrombosis and embolism: Secondary | ICD-10-CM

## 2018-12-25 DIAGNOSIS — E785 Hyperlipidemia, unspecified: Secondary | ICD-10-CM

## 2018-12-25 DIAGNOSIS — C3412 Malignant neoplasm of upper lobe, left bronchus or lung: Secondary | ICD-10-CM | POA: Diagnosis not present

## 2018-12-25 DIAGNOSIS — D630 Anemia in neoplastic disease: Secondary | ICD-10-CM

## 2018-12-25 DIAGNOSIS — Z9119 Patient's noncompliance with other medical treatment and regimen: Secondary | ICD-10-CM

## 2018-12-25 DIAGNOSIS — E039 Hypothyroidism, unspecified: Secondary | ICD-10-CM

## 2018-12-25 DIAGNOSIS — B182 Chronic viral hepatitis C: Secondary | ICD-10-CM | POA: Diagnosis not present

## 2018-12-25 DIAGNOSIS — G4700 Insomnia, unspecified: Secondary | ICD-10-CM

## 2018-12-25 DIAGNOSIS — I6529 Occlusion and stenosis of unspecified carotid artery: Secondary | ICD-10-CM | POA: Diagnosis not present

## 2018-12-25 DIAGNOSIS — F329 Major depressive disorder, single episode, unspecified: Secondary | ICD-10-CM

## 2018-12-25 DIAGNOSIS — I110 Hypertensive heart disease with heart failure: Secondary | ICD-10-CM | POA: Diagnosis not present

## 2018-12-25 DIAGNOSIS — Z9221 Personal history of antineoplastic chemotherapy: Secondary | ICD-10-CM

## 2018-12-25 DIAGNOSIS — M1990 Unspecified osteoarthritis, unspecified site: Secondary | ICD-10-CM

## 2018-12-25 DIAGNOSIS — I5042 Chronic combined systolic (congestive) and diastolic (congestive) heart failure: Secondary | ICD-10-CM | POA: Diagnosis not present

## 2018-12-25 DIAGNOSIS — F17210 Nicotine dependence, cigarettes, uncomplicated: Secondary | ICD-10-CM

## 2018-12-25 DIAGNOSIS — M62838 Other muscle spasm: Secondary | ICD-10-CM

## 2018-12-25 DIAGNOSIS — E1140 Type 2 diabetes mellitus with diabetic neuropathy, unspecified: Secondary | ICD-10-CM | POA: Diagnosis not present

## 2018-12-25 DIAGNOSIS — J449 Chronic obstructive pulmonary disease, unspecified: Secondary | ICD-10-CM | POA: Diagnosis not present

## 2018-12-25 DIAGNOSIS — K219 Gastro-esophageal reflux disease without esophagitis: Secondary | ICD-10-CM

## 2018-12-25 DIAGNOSIS — R42 Dizziness and giddiness: Secondary | ICD-10-CM | POA: Diagnosis not present

## 2018-12-25 DIAGNOSIS — Z8673 Personal history of transient ischemic attack (TIA), and cerebral infarction without residual deficits: Secondary | ICD-10-CM

## 2018-12-25 NOTE — Telephone Encounter (Signed)
Copied from [place] [id number]. Topic: Quick Communication - See Telephone Encounter >> Dec 25, 2018  4:50 PM Blase Mess A wrote: CRM for notification. See Telephone encounter for: 12/25/18. Stanton Kidney is calling from [facility] calling to notify [name] doing non visit discharge. The patient is in jail. Has been there a week. Failure to register as a sex offender. [facility] is discharge. Thank you

## 2018-12-25 NOTE — Patient Outreach (Signed)
[place] [facility]) Care Management  12/25/2018  Brad [name] 16-Jul-1956 [id number]   Call placed to front dest of [employer], third attempt to follow up with member after discharge, unsuccessful.  Unable to leave voice message.  Call then placed to [facility] to inquire about ability to contact for palliative care services.  Spoke with Manus Gunning, notified that their social worker and nurse went to motel for visit last week but member was being detained by [facility] upon arrival.  She is unsure if member remains in police custody, but report she will follow up.    This care manager will close case on 5/26 if no contact with member.  [name], [place], MSN [place] [phone]

## 2018-12-26 ENCOUNTER — Ambulatory Visit: Payer: Medicare Other | Admitting: Internal Medicine

## 2018-12-31 ENCOUNTER — Other Ambulatory Visit: Payer: Self-pay | Admitting: *Deleted

## 2018-12-31 NOTE — Patient Outreach (Signed)
[place] [facility]) Care Management  12/31/2018  ARIANA CAVENAUGH 10-11-1955 [id number]   No response from member after multiple unsuccessful outreach attempts and letter sent.  Will close case at this time due to inability to maintain contact.  Noted per chart member is currently incarcerated.  Will notify member and primary MD of case closure.   [name], [place], MSN [place] [phone]

## 2019-01-02 ENCOUNTER — Inpatient Hospital Stay (HOSPITAL_COMMUNITY)
Admission: EM | Admit: 2019-01-02 | Discharge: 2019-01-04 | DRG: 640 | Disposition: A | Discharge status: Home / Self Care | Payer: Medicare Other | Attending: Internal Medicine | Admitting: Internal Medicine

## 2019-01-02 ENCOUNTER — Emergency Department (HOSPITAL_COMMUNITY): Payer: Medicare Other

## 2019-01-02 ENCOUNTER — Encounter (HOSPITAL_COMMUNITY): Payer: Self-pay

## 2019-01-02 ENCOUNTER — Other Ambulatory Visit: Payer: Self-pay

## 2019-01-02 DIAGNOSIS — E876 Hypokalemia: Secondary | ICD-10-CM | POA: Diagnosis not present

## 2019-01-02 DIAGNOSIS — R0902 Hypoxemia: Secondary | ICD-10-CM | POA: Diagnosis present

## 2019-01-02 DIAGNOSIS — Z6826 Body mass index (BMI) 26.0-26.9, adult: Secondary | ICD-10-CM

## 2019-01-02 DIAGNOSIS — Z79890 Hormone replacement therapy (postmenopausal): Secondary | ICD-10-CM

## 2019-01-02 DIAGNOSIS — R531 Weakness: Secondary | ICD-10-CM | POA: Diagnosis not present

## 2019-01-02 DIAGNOSIS — Z885 Allergy status to narcotic agent status: Secondary | ICD-10-CM

## 2019-01-02 DIAGNOSIS — I6529 Occlusion and stenosis of unspecified carotid artery: Secondary | ICD-10-CM | POA: Diagnosis present

## 2019-01-02 DIAGNOSIS — F329 Major depressive disorder, single episode, unspecified: Secondary | ICD-10-CM | POA: Diagnosis present

## 2019-01-02 DIAGNOSIS — E785 Hyperlipidemia, unspecified: Secondary | ICD-10-CM | POA: Diagnosis present

## 2019-01-02 DIAGNOSIS — Z888 Allergy status to other drugs, medicaments and biological substances status: Secondary | ICD-10-CM

## 2019-01-02 DIAGNOSIS — E875 Hyperkalemia: Secondary | ICD-10-CM | POA: Diagnosis present

## 2019-01-02 DIAGNOSIS — I429 Cardiomyopathy, unspecified: Secondary | ICD-10-CM

## 2019-01-02 DIAGNOSIS — I5032 Chronic diastolic (congestive) heart failure: Secondary | ICD-10-CM | POA: Diagnosis not present

## 2019-01-02 DIAGNOSIS — Z801 Family history of malignant neoplasm of trachea, bronchus and lung: Secondary | ICD-10-CM

## 2019-01-02 DIAGNOSIS — C3492 Malignant neoplasm of unspecified part of left bronchus or lung: Secondary | ICD-10-CM | POA: Diagnosis present

## 2019-01-02 DIAGNOSIS — E43 Unspecified severe protein-calorie malnutrition: Secondary | ICD-10-CM | POA: Diagnosis present

## 2019-01-02 DIAGNOSIS — E039 Hypothyroidism, unspecified: Secondary | ICD-10-CM | POA: Diagnosis present

## 2019-01-02 DIAGNOSIS — I341 Nonrheumatic mitral (valve) prolapse: Secondary | ICD-10-CM | POA: Diagnosis present

## 2019-01-02 DIAGNOSIS — Z20828 Contact with and (suspected) exposure to other viral communicable diseases: Secondary | ICD-10-CM | POA: Diagnosis present

## 2019-01-02 DIAGNOSIS — Z8711 Personal history of peptic ulcer disease: Secondary | ICD-10-CM

## 2019-01-02 DIAGNOSIS — C3412 Malignant neoplasm of upper lobe, left bronchus or lung: Secondary | ICD-10-CM | POA: Diagnosis not present

## 2019-01-02 DIAGNOSIS — K219 Gastro-esophageal reflux disease without esophagitis: Secondary | ICD-10-CM | POA: Diagnosis present

## 2019-01-02 DIAGNOSIS — B182 Chronic viral hepatitis C: Secondary | ICD-10-CM | POA: Diagnosis present

## 2019-01-02 DIAGNOSIS — J90 Pleural effusion, not elsewhere classified: Secondary | ICD-10-CM | POA: Diagnosis present

## 2019-01-02 DIAGNOSIS — Z8673 Personal history of transient ischemic attack (TIA), and cerebral infarction without residual deficits: Secondary | ICD-10-CM

## 2019-01-02 DIAGNOSIS — Z8249 Family history of ischemic heart disease and other diseases of the circulatory system: Secondary | ICD-10-CM

## 2019-01-02 DIAGNOSIS — R0602 Shortness of breath: Secondary | ICD-10-CM | POA: Diagnosis not present

## 2019-01-02 DIAGNOSIS — R0600 Dyspnea, unspecified: Secondary | ICD-10-CM

## 2019-01-02 DIAGNOSIS — Z79899 Other long term (current) drug therapy: Secondary | ICD-10-CM

## 2019-01-02 DIAGNOSIS — E1151 Type 2 diabetes mellitus with diabetic peripheral angiopathy without gangrene: Secondary | ICD-10-CM | POA: Diagnosis present

## 2019-01-02 DIAGNOSIS — F17210 Nicotine dependence, cigarettes, uncomplicated: Secondary | ICD-10-CM | POA: Diagnosis present

## 2019-01-02 DIAGNOSIS — I110 Hypertensive heart disease with heart failure: Secondary | ICD-10-CM | POA: Diagnosis present

## 2019-01-02 DIAGNOSIS — I248 Other forms of acute ischemic heart disease: Secondary | ICD-10-CM | POA: Diagnosis present

## 2019-01-02 DIAGNOSIS — J449 Chronic obstructive pulmonary disease, unspecified: Secondary | ICD-10-CM | POA: Diagnosis present

## 2019-01-02 DIAGNOSIS — D509 Iron deficiency anemia, unspecified: Secondary | ICD-10-CM | POA: Diagnosis present

## 2019-01-02 DIAGNOSIS — E860 Dehydration: Principal | ICD-10-CM | POA: Diagnosis present

## 2019-01-02 DIAGNOSIS — Z66 Do not resuscitate: Secondary | ICD-10-CM | POA: Diagnosis present

## 2019-01-02 DIAGNOSIS — M1990 Unspecified osteoarthritis, unspecified site: Secondary | ICD-10-CM | POA: Diagnosis present

## 2019-01-02 DIAGNOSIS — D638 Anemia in other chronic diseases classified elsewhere: Secondary | ICD-10-CM | POA: Diagnosis present

## 2019-01-02 DIAGNOSIS — G4700 Insomnia, unspecified: Secondary | ICD-10-CM | POA: Diagnosis present

## 2019-01-02 DIAGNOSIS — G629 Polyneuropathy, unspecified: Secondary | ICD-10-CM | POA: Diagnosis present

## 2019-01-02 DIAGNOSIS — I10 Essential (primary) hypertension: Secondary | ICD-10-CM | POA: Diagnosis present

## 2019-01-02 DIAGNOSIS — C799 Secondary malignant neoplasm of unspecified site: Secondary | ICD-10-CM | POA: Diagnosis present

## 2019-01-02 LAB — CBC
HCT: 25.7 % — ABNORMAL LOW (ref 39.0–52.0)
Hemoglobin: 7.6 g/dL — ABNORMAL LOW (ref 13.0–17.0)
MCH: 24.1 pg — ABNORMAL LOW (ref 26.0–34.0)
MCHC: 29.6 g/dL — ABNORMAL LOW (ref 30.0–36.0)
MCV: 81.3 fL (ref 80.0–100.0)
Platelets: 382 10*3/uL (ref 150–400)
RBC: 3.16 MIL/uL — ABNORMAL LOW (ref 4.22–5.81)
RDW: 16.6 % — ABNORMAL HIGH (ref 11.5–15.5)
WBC: 12.6 10*3/uL — ABNORMAL HIGH (ref 4.0–10.5)
nRBC: 0 % (ref 0.0–0.2)

## 2019-01-02 LAB — HEPATIC FUNCTION PANEL
ALT: 18 U/L (ref 0–44)
AST: 26 U/L (ref 15–41)
Albumin: 1.5 g/dL — ABNORMAL LOW (ref 3.5–5.0)
Alkaline Phosphatase: 163 U/L — ABNORMAL HIGH (ref 38–126)
Bilirubin, Direct: 0.2 mg/dL (ref 0.0–0.2)
Indirect Bilirubin: 0.4 mg/dL (ref 0.3–0.9)
Total Bilirubin: 0.6 mg/dL (ref 0.3–1.2)
Total Protein: 7 g/dL (ref 6.5–8.1)

## 2019-01-02 LAB — BASIC METABOLIC PANEL
Anion gap: 13 (ref 5–15)
BUN: 36 mg/dL — ABNORMAL HIGH (ref 8–23)
CO2: 27 mmol/L (ref 22–32)
Calcium: 8.1 mg/dL — ABNORMAL LOW (ref 8.9–10.3)
Chloride: 95 mmol/L — ABNORMAL LOW (ref 98–111)
Creatinine, Ser: 1.56 mg/dL — ABNORMAL HIGH (ref 0.61–1.24)
GFR calc Af Amer: 55 mL/min — ABNORMAL LOW (ref >60)
GFR calc non Af Amer: 47 mL/min — ABNORMAL LOW (ref >60)
Glucose, Bld: 147 mg/dL — ABNORMAL HIGH (ref 70–99)
Potassium: 2.7 mmol/L — CL (ref 3.5–5.1)
Sodium: 135 mmol/L (ref 135–145)

## 2019-01-02 LAB — URINALYSIS, ROUTINE W REFLEX MICROSCOPIC
Bacteria, UA: NONE SEEN
Bilirubin Urine: NEGATIVE
Glucose, UA: 50 mg/dL — AB
Hgb urine dipstick: NEGATIVE
Ketones, ur: NEGATIVE mg/dL
Leukocytes,Ua: NEGATIVE
Nitrite: NEGATIVE
Protein, ur: 100 mg/dL — AB
Specific Gravity, Urine: 1.013 (ref 1.005–1.030)
pH: 5 (ref 5.0–8.0)

## 2019-01-02 LAB — TSH: TSH: 9.33 u[IU]/mL — ABNORMAL HIGH (ref 0.350–4.500)

## 2019-01-02 LAB — LIPASE, BLOOD: Lipase: 30 U/L (ref 11–51)

## 2019-01-02 LAB — SARS CORONAVIRUS 2 BY RT PCR (HOSPITAL ORDER, PERFORMED IN ~~LOC~~ HOSPITAL LAB): SARS Coronavirus 2: NEGATIVE

## 2019-01-02 LAB — MAGNESIUM: Magnesium: 1.8 mg/dL (ref 1.7–2.4)

## 2019-01-02 MED ORDER — SODIUM CHLORIDE 0.9% FLUSH
3.0000 mL | Freq: Once | INTRAVENOUS | Status: AC
  Administered 2019-01-02: 3 mL via INTRAVENOUS

## 2019-01-02 MED ORDER — ENOXAPARIN SODIUM 40 MG/0.4ML ~~LOC~~ SOLN
40.0000 mg | SUBCUTANEOUS | Status: DC
  Administered 2019-01-03 – 2019-01-04 (×2): 40 mg via SUBCUTANEOUS
  Filled 2019-01-02 (×2): qty 0.4

## 2019-01-02 MED ORDER — LACTATED RINGERS IV BOLUS
1000.0000 mL | Freq: Once | INTRAVENOUS | Status: AC
  Administered 2019-01-02: 1000 mL via INTRAVENOUS

## 2019-01-02 MED ORDER — METOPROLOL SUCCINATE ER 25 MG PO TB24
25.0000 mg | ORAL_TABLET | Freq: Every day | ORAL | Status: DC
  Administered 2019-01-03 – 2019-01-04 (×2): 25 mg via ORAL
  Filled 2019-01-02 (×2): qty 1

## 2019-01-02 MED ORDER — ACETAMINOPHEN 325 MG PO TABS
650.0000 mg | ORAL_TABLET | Freq: Four times a day (QID) | ORAL | Status: DC | PRN
  Administered 2019-01-03: 650 mg via ORAL
  Filled 2019-01-02: qty 2

## 2019-01-02 MED ORDER — ASPIRIN EC 81 MG PO TBEC
81.0000 mg | DELAYED_RELEASE_TABLET | Freq: Every day | ORAL | Status: DC
  Administered 2019-01-03 – 2019-01-04 (×2): 81 mg via ORAL
  Filled 2019-01-02 (×2): qty 1

## 2019-01-02 MED ORDER — AMLODIPINE BESYLATE 5 MG PO TABS
5.0000 mg | ORAL_TABLET | Freq: Every day | ORAL | Status: DC
  Administered 2019-01-03: 5 mg via ORAL
  Filled 2019-01-02: qty 1

## 2019-01-02 MED ORDER — POTASSIUM CHLORIDE 10 MEQ/100ML IV SOLN
10.0000 meq | Freq: Once | INTRAVENOUS | Status: AC
  Administered 2019-01-02: 10 meq via INTRAVENOUS
  Filled 2019-01-02: qty 100

## 2019-01-02 MED ORDER — ACETAMINOPHEN 650 MG RE SUPP: 650.0000 mg | Freq: Four times a day (QID) | RECTAL | Status: DC | PRN

## 2019-01-02 MED ORDER — PANTOPRAZOLE SODIUM 40 MG PO TBEC
40.0000 mg | DELAYED_RELEASE_TABLET | Freq: Every day | ORAL | Status: DC
  Administered 2019-01-03 – 2019-01-04 (×2): 40 mg via ORAL
  Filled 2019-01-02 (×2): qty 1

## 2019-01-02 MED ORDER — UMECLIDINIUM-VILANTEROL 62.5-25 MCG/INH IN AEPB
1.0000 | INHALATION_SPRAY | Freq: Every day | RESPIRATORY_TRACT | Status: DC
  Administered 2019-01-03 – 2019-01-04 (×2): 1 via RESPIRATORY_TRACT
  Filled 2019-01-02: qty 14

## 2019-01-02 MED ORDER — ALBUTEROL SULFATE (2.5 MG/3ML) 0.083% IN NEBU: 3.0000 mL | INHALATION_SOLUTION | Freq: Four times a day (QID) | RESPIRATORY_TRACT | Status: DC | PRN

## 2019-01-02 MED ORDER — POTASSIUM CHLORIDE CRYS ER 20 MEQ PO TBCR
40.0000 meq | EXTENDED_RELEASE_TABLET | Freq: Once | ORAL | Status: AC
  Administered 2019-01-02: 40 meq via ORAL
  Filled 2019-01-02: qty 2

## 2019-01-02 MED ORDER — LEVOTHYROXINE SODIUM 112 MCG PO TABS
112.0000 ug | ORAL_TABLET | Freq: Every day | ORAL | Status: DC
  Administered 2019-01-03 – 2019-01-04 (×2): 112 ug via ORAL
  Filled 2019-01-02 (×2): qty 1

## 2019-01-02 NOTE — ED Notes (Signed)
Attempted to call report. Pt's room is being reassigned. Will call back in 10 min.

## 2019-01-02 NOTE — ED Notes (Signed)
ED TO INPATIENT HANDOFF REPORT  ED Nurse Name and Phone #: [phone]  S Name/Age/Gender Verlin Fester 63 y.o. male Room/Bed: 037C/037C  Code Status   Code Status: Prior  Home/SNF/Other correction facility  Patient oriented to: self, place, time and situation Is this baseline? Yes   Triage Complete: Triage complete  Chief Complaint dehydration  Triage Note Pt from jail, sent here by medical staff for evaluation of dehydration and generalized weakness, hx of metastatic lung cancer. Pt denies any pain, pt a.o, nad noted.    Allergies Allergies  Allergen Reactions  . Doxycycline Hives and Itching  . Lipitor [Atorvastatin] Other (See Comments)    Myalgia   . Oxycodone Itching    Level of Care/Admitting Diagnosis ED Disposition    ED Disposition Condition Comment   Admit  Hospital Area: [place] [[id number]]  Level of Care: Telemetry Medical [104]  I expect the patient will be discharged within 24 hours: No (not a candidate for 5C-Observation unit)  Covid Evaluation: N/A  Diagnosis: Weakness [378588]  Admitting Physician: Rise Patience [phone]  Attending Physician: Rise Patience Lei.Right  PT Class (Do Not Modify): Observation [104]  PT Acc Code (Do Not Modify): Observation [10022]       B Medical/Surgery History Past Medical History:  Diagnosis Date  . Anemia    takes Ferrous Sulfated daily  . Arthritis   . Carotid stenosis 08/27/2014   takes Aspirin daily  . Chronic hepatitis C ([place]) 09/17/2015  . COPD (chronic obstructive pulmonary disease) (HCC)    Albuterol daily as needed and Spiriva daily  . Depression   . Diabetes mellitus type II    takes Metformin and Glipizide daily  . Dizziness   . GERD (gastroesophageal reflux disease)   . GI bleed 12/2015  . Headache    occasionally  . History of blood clots    in right leg;being followed by Dr.Brabham for this  . History of blood transfusion 09/2014   2 units-no abnormal  reaction noted  . History of gastric ulcer   . History of hiatal hernia   . Hyperlipidemia 08/27/2014   takes Simvastatin daily  . Hypertension    takes Amlodipine and Lisinopril daily  . Hypothyroidism 08/27/2014   takes Synthroid daily  . Insomnia    takes Pamelor nightly  . Joint pain    hips  . Muscle spasm    takes Zanaflex daily as needed  . MVP (mitral valve prolapse)   . Peripheral edema    takes Furosemide daily as needed  . Peripheral neuropathy   . Pleural effusion 11/2018  . Prurigo nodularis   . Stroke [facility])    Past Surgical History:  Procedure Laterality Date  . ENDARTERECTOMY Left 11/13/2014   Procedure: ENDARTERECTOMY CAROTID WITH PATCH ANGIOPLASTY;  Surgeon: Serafina Mitchell, MD;  Location[facility] OR;  Service: Vascular;  Laterality: Left;  . ESOPHAGOGASTRODUODENOSCOPY N/A 09/24/2014   Procedure: ESOPHAGOGASTRODUODENOSCOPY (EGD);  Surgeon: Winfield Cunas., MD;  Location: [facility] ENDOSCOPY;  Service: Endoscopy;  Laterality: N/A;  . FUDUCIAL PLACEMENT N/A 06/26/2017   Procedure: PLACEMENT OF FUDUCIAL;  Surgeon: Grace Isaac, MD;  Location: [place];  Service: Thoracic;  Laterality: N/A;  . I&D of right arm    . INCISION AND DRAINAGE PERIRECTAL ABSCESS  03/2009  . IR THORACENTESIS ASP PLEURAL SPACE W/IMG GUIDE  11/21/2018  . IR THORACENTESIS ASP PLEURAL SPACE W/IMG GUIDE  12/06/2018  . IR THORACENTESIS ASP PLEURAL SPACE W/IMG GUIDE  12/11/2018  . Neg Stress Test  2000  . PERIPHERAL VASCULAR CATHETERIZATION N/A 11/30/2015   Procedure: Carotid PTA/Stent Intervention;  Surgeon: Serafina Mitchell, MD;  Location: [place] CV LAB;  Service: Cardiovascular;  Laterality: N/A;  . ROTATOR CUFF REPAIR Right 2009   multiple f/u Sxs/infection Tx  . VASCULAR SURGERY    . VIDEO BRONCHOSCOPY WITH ENDOBRONCHIAL NAVIGATION N/A 05/25/2017   Procedure: VIDEO BRONCHOSCOPY WITH ENDOBRONCHIAL NAVIGATION;  Surgeon: Rigoberto Noel, MD;  Location: [place];  Service: Thoracic;  Laterality: N/A;  .  VIDEO BRONCHOSCOPY WITH ENDOBRONCHIAL NAVIGATION N/A 06/26/2017   Procedure: VIDEO BRONCHOSCOPY WITH ENDOBRONCHIAL NAVIGATION WITH PLACEMENT OF FIDUCIALS;  Surgeon: Grace Isaac, MD;  Location: [place];  Service: Thoracic;  Laterality: N/A;  . VIDEO BRONCHOSCOPY WITH ENDOBRONCHIAL ULTRASOUND N/A 05/25/2017   Procedure: VIDEO BRONCHOSCOPY WITH ENDOBRONCHIAL ULTRASOUND;  Surgeon: Rigoberto Noel, MD;  Location: [place];  Service: Thoracic;  Laterality: N/A;     A IV Location/Drains/Wounds Patient Lines/Drains/Airways Status   Active Line/Drains/Airways    Name:   Placement date:   Placement time:   Site:   Days:   Peripheral IV 01/02/19 Right Antecubital   01/02/19    2027    Antecubital   less than 1   Incision (Closed) 11/13/14 Neck Left   11/13/14    0723     1511   Incision (Closed) 05/25/17 N/A Other (Comment)   05/25/17    1500     587   Wound / Incision (Open or Dehisced) 11/21/18 Thigh Left;Upper;Lateral pt claimed it's a pressure sore, measured 1 cm x 1 cm   11/21/18    0018    Thigh   42          Intake/Output Last 24 hours No intake or output data in the 24 hours ending 01/02/19 2228  Labs/Imaging Results for orders placed or performed during the hospital encounter of 01/02/19 (from the past 48 hour(s))  Basic metabolic panel     Status: Abnormal   Collection Time: 01/02/19  6:18 PM  Result Value Ref Range   Sodium 135 135 - 145 mmol/L   Potassium 2.7 (LL) 3.5 - 5.1 mmol/L    Comment: CRITICAL RESULT CALLED TO, READ BACK BY AND VERIFIED WITH: Peggye Fothergill AT 1914 01/02/2019 BY ZBEECH.    Chloride 95 (L) 98 - 111 mmol/L   CO2 27 22 - 32 mmol/L   Glucose, Bld 147 (H) 70 - 99 mg/dL   BUN 36 (H) 8 - 23 mg/dL   Creatinine, Ser 1.56 (H) 0.61 - 1.24 mg/dL   Calcium 8.1 (L) 8.9 - 10.3 mg/dL   GFR calc non Af Amer 47 (L) >60 mL/min   GFR calc Af Amer 55 (L) >60 mL/min   Anion gap 13 5 - 15    Comment: Performed at [facility] [address], [place],  [place] [address]  CBC     Status: Abnormal   Collection Time: 01/02/19  6:18 PM  Result Value Ref Range   WBC 12.6 (H) 4.0 - 10.5 K/uL   RBC 3.16 (L) 4.22 - 5.81 MIL/uL   Hemoglobin 7.6 (L) 13.0 - 17.0 g/dL   HCT 25.7 (L) 39.0 - 52.0 %   MCV 81.3 80.0 - 100.0 fL   MCH 24.1 (L) 26.0 - 34.0 pg   MCHC 29.6 (L) 30.0 - 36.0 g/dL   RDW 16.6 (H) 11.5 - 15.5 %   Platelets 382 150 - 400  K/uL   nRBC 0.0 0.0 - 0.2 %    Comment: Performed at [facility], [place] [address], [place], [place] [address]  Hepatic function panel     Status: Abnormal   Collection Time: 01/02/19  7:09 PM  Result Value Ref Range   Total Protein 7.0 6.5 - 8.1 g/dL   Albumin 1.5 (L) 3.5 - 5.0 g/dL   AST 26 15 - 41 U/L   ALT 18 0 - 44 U/L   Alkaline Phosphatase 163 (H) 38 - 126 U/L   Total Bilirubin 0.6 0.3 - 1.2 mg/dL   Bilirubin, Direct 0.2 0.0 - 0.2 mg/dL   Indirect Bilirubin 0.4 0.3 - 0.9 mg/dL    Comment: Performed at [facility] [address] [place], [place] [address]  Lipase, blood     Status: None   Collection Time: 01/02/19  7:09 PM  Result Value Ref Range   Lipase 30 11 - 51 U/L    Comment: Performed at [facility], [place] [address] [place], [place] [address]  TSH     Status: Abnormal   Collection Time: 01/02/19  7:39 PM  Result Value Ref Range   TSH 9.330 (H) 0.350 - 4.500 uIU/mL    Comment: Performed by a 3rd Generation assay with a functional sensitivity of <=0.01 uIU/mL. Performed at [facility], [place] [address], [place], [place] [address]   Magnesium     Status: None   Collection Time: 01/02/19  7:39 PM  Result Value Ref Range   Magnesium 1.8 1.7 - 2.4 mg/dL    Comment: Performed at [facility], [place] [address], [place], [place] [address]  Urinalysis, Routine w reflex microscopic     Status: Abnormal   Collection Time: 01/02/19  8:20 PM  Result Value Ref Range   Color, Urine YELLOW YELLOW   APPearance CLEAR CLEAR   Specific Gravity, Urine 1.013 1.005 - 1.030   pH 5.0  5.0 - 8.0   Glucose, UA 50 (A) NEGATIVE mg/dL   Hgb urine dipstick NEGATIVE NEGATIVE   Bilirubin Urine NEGATIVE NEGATIVE   Ketones, ur NEGATIVE NEGATIVE mg/dL   Protein, ur 100 (A) NEGATIVE mg/dL   Nitrite NEGATIVE NEGATIVE   Leukocytes,Ua NEGATIVE NEGATIVE   RBC / HPF 0-5 0 - 5 RBC/hpf   WBC, UA 0-5 0 - 5 WBC/hpf   Bacteria, UA NONE SEEN NONE SEEN   Squamous Epithelial / LPF 0-5 0 - 5   Mucus PRESENT    Hyaline Casts, UA PRESENT     Comment: Performed at [facility], [place] [address], [place], [place] [address]  SARS Coronavirus 2 (CEPHEID - Performed in [facility] hospital lab), Hosp Order     Status: None   Collection Time: 01/02/19  9:05 PM  Result Value Ref Range   SARS Coronavirus 2 NEGATIVE NEGATIVE    Comment: (NOTE) If result is NEGATIVE SARS-CoV-2 target nucleic acids are NOT DETECTED. The SARS-CoV-2 RNA is generally detectable in upper and lower  respiratory specimens during the acute phase of infection. The lowest  concentration of SARS-CoV-2 viral copies this assay can detect is 250  copies / mL. A negative result does not preclude SARS-CoV-2 infection  and should not be used as the sole basis for treatment or other  patient management decisions.  A negative result may occur with  improper specimen collection / handling, submission of specimen other  than nasopharyngeal swab, presence of viral mutation(s)  within the  areas targeted by this assay, and inadequate number of viral copies  (<250 copies / mL). A negative result must be combined with clinical  observations, patient history, and epidemiological information. If result is POSITIVE SARS-CoV-2 target nucleic acids are DETECTED. The SARS-CoV-2 RNA is generally detectable in upper and lower  respiratory specimens dur ing the acute phase of infection.  Positive  results are indicative of active infection with SARS-CoV-2.  Clinical  correlation with patient history and other diagnostic information is   necessary to determine patient infection status.  Positive results do  not rule out bacterial infection or co-infection with other viruses. If result is PRESUMPTIVE POSTIVE SARS-CoV-2 nucleic acids MAY BE PRESENT.   A presumptive positive result was obtained on the submitted specimen  and confirmed on repeat testing.  While 2019 novel coronavirus  (SARS-CoV-2) nucleic acids may be present in the submitted sample  additional confirmatory testing may be necessary for epidemiological  and / or clinical management purposes  to differentiate between  SARS-CoV-2 and other Sarbecovirus currently known to infect humans.  If clinically indicated additional testing with an alternate test  methodology [phone]) is advised. The SARS-CoV-2 RNA is generally  detectable in upper and lower respiratory sp ecimens during the acute  phase of infection. The expected result is Negative. Fact Sheet for Patients:  StrictlyIdeas.no Fact Sheet for Healthcare Providers: BankingDealers.co.za This test is not yet approved or cleared by the [place] FDA and has been authorized for detection and/or diagnosis of SARS-CoV-2 by FDA under an Emergency Use Authorization (EUA).  This EUA will remain in effect (meaning this test can be used) for the duration of the COVID-19 declaration under Section 564(b)(1) of the Act, 21 U.S.C. section 360bbb-3(b)(1), unless the authorization is terminated or revoked sooner. Performed at [facility], [place] [address] [place], [place] [address]    Ct Head Wo Contrast  Result Date: 01/02/2019 CLINICAL DATA:  Dehydration and generalized weakness. History of metastatic lung cancer. EXAM: CT HEAD WITHOUT CONTRAST TECHNIQUE: Contiguous axial images were obtained from the base of the skull through the vertex without intravenous contrast. COMPARISON:  12/12/2018 FINDINGS: Brain: Generalized atrophy. Chronic small-vessel changes of the  pons. No focal cerebellar finding. Old small vessel infarctions affecting the thalami and basal ganglia. Chronic small-vessel ischemic change of the cerebral hemispheric white matter. No sign of acute infarction, mass lesion, hemorrhage, hydrocephalus or extra-axial collection. Vascular: There is atherosclerotic calcification of the major vessels at the base of the brain. Skull: Negative Sinuses/Orbits: Clear/normal Other: None IMPRESSION: No change. No acute finding. No evidence of metastatic disease. Atrophy. Old lacunar infarctions of the thalami and basal ganglia and chronic small-vessel ischemic changes of the pons and cerebral hemispheric white matter. Electronically Signed   By: Nelson Chimes M.D.   On: 01/02/2019 21:49   Dg Chest Portable 1 View  Result Date: 01/02/2019 CLINICAL DATA:  Shortness of breath. History of squamous cell carcinoma of the lung. EXAM: PORTABLE CHEST 1 VIEW COMPARISON:  Multiple prior chest x-rays and chest CT 11/30/2018 FINDINGS: Stable large left apically lung mass and compressive left lower lobe atelectasis and left pleural effusion. The right lung is relatively clear. No obvious right lung lesions. Underlying emphysematous changes are noted. The bony thorax is intact. There is marked elevation of the right humeral head in the glenoid fossa likely due to a large rotator cuff tear. IMPRESSION: Stable large left AP cul lung mass and left lower lobe atelectasis and effusion. Electronically  Signed   By: Marijo Sanes M.D.   On: 01/02/2019 19:56    Pending Labs Unresulted Labs (From admission, onward)   None      Vitals/Pain Today's Vitals   01/02/19 1814 01/02/19 1816 01/02/19 2128 01/02/19 2211  BP: (!) 151/89     Pulse: 87     Resp: 18  (!) 27   Temp: 98.2 F (36.8 C)     TempSrc: Oral     SpO2: 100%     PainSc:  0-No pain  0-No pain    Isolation Precautions No active isolations  Medications Medications  sodium chloride flush (NS) 0.9 % injection 3 mL (3  mLs Intravenous Given 01/02/19 2105)  potassium chloride 10 mEq in 100 mL IVPB (0 mEq Intravenous Stopped 01/02/19 2131)  potassium chloride SA (K-DUR) CR tablet 40 mEq (40 mEq Oral Given 01/02/19 2028)  lactated ringers bolus 1,000 mL (1,000 mLs Intravenous New Bag/Given 01/02/19 2105)    Mobility walks High fall risk   Focused Assessments    R Recommendations: See Admitting Provider Note  Report given to:   Additional Notes:

## 2019-01-02 NOTE — H&P (Signed)
History and Physical    Brad [name] FXJ:[id number] DOB: 02-11-1956 DOA: 01/02/2019  PCP: Biagio Borg, MD  Patient coming from: Patient was transferred from jail.  Chief Complaint: Weakness.  HPI: Brad [name] is a 63 y.o. male with history of metastatic lung cancer, COPD, diabetes mellitus, CHF, anemia, history of hepatitis C being treated with Harvoni has been in the jail last 2 weeks recently admitted for weakness and CHF required thoracentesis presents with increasing weakness over the last 1 week.  Patient states he has been feeling weak but denies any loss of consciousness chest pain shortness of breath or diarrhea or nausea vomiting.  Denies any focal deficits.  Has been compliant with his medications.  ED Course: In the ER labs revealed sodium of 135 potassium 2.7 creatinine 1.5 which is increased from 1.23 weeks ago.  LFTs were showing alk phos of 163 albumin of 1.5.  CBC shows hemoglobin of 7.6 which is decreased from 8.  Platelets 382.  X-ray shows stable left sided mass and effusion.  EKG shows normal sinus rhythm with ST depression in the inferolateral leads.  Fluid bolus potassium replacement and admitted for generalized weakness.  COVID-19 test was negative.  Patient is afebrile.  Review of Systems: As per HPI, rest all negative.   Past Medical History:  Diagnosis Date  . Anemia    takes Ferrous Sulfated daily  . Arthritis   . Carotid stenosis 08/27/2014   takes Aspirin daily  . Chronic hepatitis C ([place]) 09/17/2015  . COPD (chronic obstructive pulmonary disease) (HCC)    Albuterol daily as needed and Spiriva daily  . Depression   . Diabetes mellitus type II    takes Metformin and Glipizide daily  . Dizziness   . GERD (gastroesophageal reflux disease)   . GI bleed 12/2015  . Headache    occasionally  . History of blood clots    in right leg;being followed by Dr.Brabham for this  . History of blood transfusion 09/2014   2 units-no abnormal reaction noted   . History of gastric ulcer   . History of hiatal hernia   . Hyperlipidemia 08/27/2014   takes Simvastatin daily  . Hypertension    takes Amlodipine and Lisinopril daily  . Hypothyroidism 08/27/2014   takes Synthroid daily  . Insomnia    takes Pamelor nightly  . Joint pain    hips  . Muscle spasm    takes Zanaflex daily as needed  . MVP (mitral valve prolapse)   . Peripheral edema    takes Furosemide daily as needed  . Peripheral neuropathy   . Pleural effusion 11/2018  . Prurigo nodularis   . Stroke [facility])     Past Surgical History:  Procedure Laterality Date  . ENDARTERECTOMY Left 11/13/2014   Procedure: ENDARTERECTOMY CAROTID WITH PATCH ANGIOPLASTY;  Surgeon: Serafina Mitchell, MD;  Location: [facility] OR;  Service: Vascular;  Laterality: Left;  . ESOPHAGOGASTRODUODENOSCOPY N/A 09/24/2014   Procedure: ESOPHAGOGASTRODUODENOSCOPY (EGD);  Surgeon: Winfield Cunas., MD;  Location: [facility] ENDOSCOPY;  Service: Endoscopy;  Laterality: N/A;  . FUDUCIAL PLACEMENT N/A 06/26/2017   Procedure: PLACEMENT OF FUDUCIAL;  Surgeon: Grace Isaac, MD;  Location: [facility];  Service: Thoracic;  Laterality: N/A;  . I&D of right arm    . INCISION AND DRAINAGE PERIRECTAL ABSCESS  03/2009  . IR THORACENTESIS ASP PLEURAL SPACE W/IMG GUIDE  11/21/2018  . IR THORACENTESIS ASP PLEURAL SPACE W/IMG GUIDE  12/06/2018  . IR THORACENTESIS  ASP PLEURAL SPACE W/IMG GUIDE  12/11/2018  . Neg Stress Test  2000  . PERIPHERAL VASCULAR CATHETERIZATION N/A 11/30/2015   Procedure: Carotid PTA/Stent Intervention;  Surgeon: Serafina Mitchell, MD;  Location: [facility] LAB;  Service: Cardiovascular;  Laterality: N/A;  . ROTATOR CUFF REPAIR Right 2009   multiple f/u Sxs/infection Tx  . VASCULAR SURGERY    . VIDEO BRONCHOSCOPY WITH ENDOBRONCHIAL NAVIGATION N/A 05/25/2017   Procedure: VIDEO BRONCHOSCOPY WITH ENDOBRONCHIAL NAVIGATION;  Surgeon: Rigoberto Noel, MD;  Location: [facility];  Service: Thoracic;  Laterality: N/A;  . VIDEO  BRONCHOSCOPY WITH ENDOBRONCHIAL NAVIGATION N/A 06/26/2017   Procedure: VIDEO BRONCHOSCOPY WITH ENDOBRONCHIAL NAVIGATION WITH PLACEMENT OF FIDUCIALS;  Surgeon: Grace Isaac, MD;  Location: [facility];  Service: Thoracic;  Laterality: N/A;  . VIDEO BRONCHOSCOPY WITH ENDOBRONCHIAL ULTRASOUND N/A 05/25/2017   Procedure: VIDEO BRONCHOSCOPY WITH ENDOBRONCHIAL ULTRASOUND;  Surgeon: Rigoberto Noel, MD;  Location: [place] [facility];  Service: Thoracic;  Laterality: N/A;     reports that he has been smoking cigarettes. He has a 48.00 pack-year smoking history. He has never used smokeless tobacco. He reports previous alcohol use of about 1.0 standard drinks of alcohol per week. He reports that he does not use drugs.  Allergies  Allergen Reactions  . Doxycycline Hives and Itching  . Lipitor [Atorvastatin] Other (See Comments)    Myalgia   . Oxycodone Itching    Family History  Problem Relation Age of Onset  . Heart disease Mother        automated implantable cardioverter-defibrillator   . Diabetes type II Mother   . Alcohol abuse Father   . Lung cancer Father   . Lung cancer Maternal Grandmother   . Lung cancer Sister     Prior to Admission medications   Medication Sig Start Date End Date Taking? Authorizing Provider  albuterol (VENTOLIN HFA) 108 (90 Base) MCG/ACT inhaler Inhale 2 puffs into the lungs every 6 (six) hours as needed for wheezing or shortness of breath. 12/13/18   Rai, Ripudeep K, MD  amLODipine (NORVASC) 5 MG tablet Take 1 tablet (5 mg total) by mouth daily. 12/13/18 02/11/19  Rai, Vernelle Emerald, MD  aspirin EC 81 MG tablet Take 1 tablet (81 mg total) by mouth daily. 12/13/18   Rai, Vernelle Emerald, MD  Blood Pressure Monitor DEVI Use to check Blood pressure daily Patient not taking: Reported on 11/25/2018 05/25/14   Biagio Borg, MD  ferrous sulfate 325 (65 FE) MG tablet Take 1 tablet (325 mg total) by mouth 3 (three) times daily with meals. Patient not taking: Reported on 11/25/2018 09/25/14   Regalado,  Jerald Kief A, MD  furosemide (LASIX) 80 MG tablet Take 1 tablet (80 mg total) by mouth daily. 12/13/18 03/13/19  Rai, Ripudeep K, MD  glucose blood (ACCU-CHEK AVIVA PLUS) test strip Use to check blood sugars daily Dx E11.9 Patient not taking: Reported on 11/25/2018 11/09/15   Biagio Borg, MD  levothyroxine (SYNTHROID) 112 MCG tablet Take 1 tablet (112 mcg total) by mouth daily. 12/13/18   Rai, Vernelle Emerald, MD  metoprolol succinate (TOPROL-XL) 25 MG 24 hr tablet Take 1 tablet (25 mg total) by mouth daily. 12/13/18 02/11/19  Rai, Ripudeep K, MD  pantoprazole (PROTONIX) 40 MG tablet Take 1 tablet (40 mg total) by mouth daily. 12/13/18 02/11/19  Rai, Ripudeep K, MD  umeclidinium-vilanterol (ANORO ELLIPTA) 62.5-25 MCG/INH AEPB Inhale 1 puff into the lungs daily. Patient not taking: Reported on 11/25/2018 05/30/17   Elsworth Soho,  Leanna Sato, MD    Physical Exam: Vitals:   01/02/19 1814 01/02/19 2128  BP: (!) 151/89   Pulse: 87   Resp: 18 (!) 27  Temp: 98.2 F (36.8 C)   TempSrc: Oral   SpO2: 100%       Constitutional: Moderately built and nourished. Vitals:   01/02/19 1814 01/02/19 2128  BP: (!) 151/89   Pulse: 87   Resp: 18 (!) 27  Temp: 98.2 F (36.8 C)   TempSrc: Oral   SpO2: 100%    Eyes: Anicteric mild pallor. ENMT: No discharge from the ears eyes nose and mouth. Neck: No mass felt.  No neck rigidity.  No JVD appreciated. Respiratory: No rhonchi or crepitations. Cardiovascular: S1-S2 heard. Abdomen: Soft nontender bowel sounds present. Musculoskeletal: No edema. Skin: No rash. Neurologic: Alert awake oriented to time place and person.  Moves all extremities. Psychiatric: Appears normal per normal affect.   Labs on Admission: I have personally reviewed following labs and imaging studies  CBC: Recent Labs  Lab 01/02/19 1818  WBC 12.6*  HGB 7.6*  HCT 25.7*  MCV 81.3  PLT 161   Basic Metabolic Panel: Recent Labs  Lab 01/02/19 1818 01/02/19 1939  NA 135  --   K 2.7*  --   CL 95*  --    CO2 27  --   GLUCOSE 147*  --   BUN 36*  --   CREATININE 1.56*  --   CALCIUM 8.1*  --   MG  --  1.8   GFR: CrCl cannot be calculated (Unknown ideal weight.). Liver Function Tests: Recent Labs  Lab 01/02/19 1909  AST 26  ALT 18  ALKPHOS 163*  BILITOT 0.6  PROT 7.0  ALBUMIN 1.5*   Recent Labs  Lab 01/02/19 1909  LIPASE 30   No results for input(s): AMMONIA in the last 168 hours. Coagulation Profile: No results for input(s): INR, PROTIME in the last 168 hours. Cardiac Enzymes: No results for input(s): CKTOTAL, CKMB, CKMBINDEX, TROPONINI in the last 168 hours. BNP (last 3 results) No results for input(s): PROBNP in the last 8760 hours. HbA1C: No results for input(s): HGBA1C in the last 72 hours. CBG: No results for input(s): GLUCAP in the last 168 hours. Lipid Profile: No results for input(s): CHOL, HDL, LDLCALC, TRIG, CHOLHDL, LDLDIRECT in the last 72 hours. Thyroid Function Tests: Recent Labs    01/02/19 1939  TSH 9.330*   Anemia Panel: No results for input(s): VITAMINB12, FOLATE, FERRITIN, TIBC, IRON, RETICCTPCT in the last 72 hours. Urine analysis:    Component Value Date/Time   COLORURINE YELLOW 01/02/2019 2020   APPEARANCEUR CLEAR 01/02/2019 2020   LABSPEC 1.013 01/02/2019 2020   PHURINE 5.0 01/02/2019 2020   GLUCOSEU 50 (A) 01/02/2019 2020   GLUCOSEU NEGATIVE 05/10/2016 1434   HGBUR NEGATIVE 01/02/2019 2020   BILIRUBINUR NEGATIVE 01/02/2019 2020   KETONESUR NEGATIVE 01/02/2019 2020   PROTEINUR 100 (A) 01/02/2019 2020   UROBILINOGEN 0.2 05/10/2016 1434   NITRITE NEGATIVE 01/02/2019 2020   LEUKOCYTESUR NEGATIVE 01/02/2019 2020   Sepsis Labs: @LABRCNTIP (procalcitonin:4,lacticidven:4) ) Recent Results (from the past 240 hour(s))  SARS Coronavirus 2 (CEPHEID - Performed in [facility] hospital lab), Hosp Order     Status: None   Collection Time: 01/02/19  9:05 PM  Result Value Ref Range Status   SARS Coronavirus 2 NEGATIVE NEGATIVE Final     Comment: (NOTE) If result is NEGATIVE SARS-CoV-2 target nucleic acids are NOT DETECTED. The SARS-CoV-2 RNA is generally  detectable in upper and lower  respiratory specimens during the acute phase of infection. The lowest  concentration of SARS-CoV-2 viral copies this assay can detect is 250  copies / mL. A negative result does not preclude SARS-CoV-2 infection  and should not be used as the sole basis for treatment or other  patient management decisions.  A negative result may occur with  improper specimen collection / handling, submission of specimen other  than nasopharyngeal swab, presence of viral mutation(s) within the  areas targeted by this assay, and inadequate number of viral copies  (<250 copies / mL). A negative result must be combined with clinical  observations, patient history, and epidemiological information. If result is POSITIVE SARS-CoV-2 target nucleic acids are DETECTED. The SARS-CoV-2 RNA is generally detectable in upper and lower  respiratory specimens dur ing the acute phase of infection.  Positive  results are indicative of active infection with SARS-CoV-2.  Clinical  correlation with patient history and other diagnostic information is  necessary to determine patient infection status.  Positive results do  not rule out bacterial infection or co-infection with other viruses. If result is PRESUMPTIVE POSTIVE SARS-CoV-2 nucleic acids MAY BE PRESENT.   A presumptive positive result was obtained on the submitted specimen  and confirmed on repeat testing.  While 2019 novel coronavirus  (SARS-CoV-2) nucleic acids may be present in the submitted sample  additional confirmatory testing may be necessary for epidemiological  and / or clinical management purposes  to differentiate between  SARS-CoV-2 and other Sarbecovirus currently known to infect humans.  If clinically indicated additional testing with an alternate test  methodology [phone]) is advised. The SARS-CoV-2  RNA is generally  detectable in upper and lower respiratory sp ecimens during the acute  phase of infection. The expected result is Negative. Fact Sheet for Patients:  StrictlyIdeas.no Fact Sheet for Healthcare Providers: BankingDealers.co.za This test is not yet approved or cleared by the [place] FDA and has been authorized for detection and/or diagnosis of SARS-CoV-2 by FDA under an Emergency Use Authorization (EUA).  This EUA will remain in effect (meaning this test can be used) for the duration of the COVID-19 declaration under Section 564(b)(1) of the Act, 21 U.S.C. section 360bbb-3(b)(1), unless the authorization is terminated or revoked sooner. Performed at [facility], [place] [address] [place], [place] [address]      Radiological Exams on Admission: Ct Head Wo Contrast  Result Date: 01/02/2019 CLINICAL DATA:  Dehydration and generalized weakness. History of metastatic lung cancer. EXAM: CT HEAD WITHOUT CONTRAST TECHNIQUE: Contiguous axial images were obtained from the base of the skull through the vertex without intravenous contrast. COMPARISON:  12/12/2018 FINDINGS: Brain: Generalized atrophy. Chronic small-vessel changes of the pons. No focal cerebellar finding. Old small vessel infarctions affecting the thalami and basal ganglia. Chronic small-vessel ischemic change of the cerebral hemispheric white matter. No sign of acute infarction, mass lesion, hemorrhage, hydrocephalus or extra-axial collection. Vascular: There is atherosclerotic calcification of the major vessels at the base of the brain. Skull: Negative Sinuses/Orbits: Clear/normal Other: None IMPRESSION: No change. No acute finding. No evidence of metastatic disease. Atrophy. Old lacunar infarctions of the thalami and basal ganglia and chronic small-vessel ischemic changes of the pons and cerebral hemispheric white matter. Electronically Signed   By: Nelson Chimes M.D.    On: 01/02/2019 21:49   Dg Chest Portable 1 View  Result Date: 01/02/2019 CLINICAL DATA:  Shortness of breath. History of squamous cell carcinoma of the lung. EXAM:  PORTABLE CHEST 1 VIEW COMPARISON:  Multiple prior chest x-rays and chest CT 11/30/2018 FINDINGS: Stable large left apically lung mass and compressive left lower lobe atelectasis and left pleural effusion. The right lung is relatively clear. No obvious right lung lesions. Underlying emphysematous changes are noted. The bony thorax is intact. There is marked elevation of the right humeral head in the glenoid fossa likely due to a large rotator cuff tear. IMPRESSION: Stable large left AP cul lung mass and left lower lobe atelectasis and effusion. Electronically Signed   By: Marijo Sanes M.D.   On: 01/02/2019 19:56    EKG: Independently reviewed.  Normal sinus rhythm with inferolateral ST depression.  Assessment/Plan Active Problems:   COPD (chronic obstructive pulmonary disease) (HCC)   Hypothyroidism   Essential hypertension   Secondary cardiomyopathy (HCC)   Stage III squamous cell carcinoma of left lung (HCC)   Weakness   Hypokalemia    1. Generalized weakness cause not clear.  Patient is having severe protein calorie malnutrition with dehydration with worsening renal function and elevated TSH and worsening anemia and hyperkalemia.  Will hold patient's diuretic patient did receive 1 L fluid bolus in the ER but will gently hydrate.  Replace potassium.  Follow CBC if there is further decline in hemoglobin will need transfusion.  Not sure if patient was compliant with his Synthroid.  For now I have just dosed Synthroid what he takes previously.  Given the ST depression in the inferolateral leads will check troponin. 2. Metastatic lung cancer probably contributing to patient's general decline.  Will need oncology input. 3. Hypertension -on amlodipine and metoprolol. 4. History of CHF will hold Lasix due to dehydration and weakness.   Follow respiratory status closely.  Last EF measured was 55 to 60% in April 2020.  Followed by Dr. Virgina Jock cardiologist. 5. Diabetes mellitus type 2 we will keep patient on sliding scale coverage. 6. Anemia see #1. 7. Hypothyroidism see #1.   DVT prophylaxis: Lovenox. Code Status: DNR confirmed with patient. Family Communication: Discussed with patient. Disposition Plan: To be determined. Consults called: None. Admission status: Observation.   Rise Patience MD [facility] Pager [phone].  If 7PM-7AM, please contact night-coverage [email or web address] Password [email or web address]  01/02/2019, 10:30 PM

## 2019-01-02 NOTE — ED Provider Notes (Signed)
[facility] EMERGENCY DEPARTMENT Provider Note   CSN: [id number] Arrival date & time: 01/02/19  1811    History   Chief Complaint Chief Complaint  Patient presents with  . Weakness    HPI Brad [name] is a 63 y.o. male.  HPI 63 year old male with a history of COPD, stage III lung cancer, CHF, DM, HTN, PVD, chronic hepatitis C, anemia, prior CVA, DVT no longer on anticoagulation presents with weakness.  Patient is coming from present with worsening weakness over the past few weeks.  He has had some shortness of breath but not significantly worse than his baseline.  Denies chest pain, nausea, vomiting, abdominal pain, fever, chills.  He does state that over the past few weeks he has had difficulty ambulating due to how weak he is.  No facial droop or slurred speech.  Past Medical History:  Diagnosis Date  . Anemia    takes Ferrous Sulfated daily  . Arthritis   . Carotid stenosis 08/27/2014   takes Aspirin daily  . Chronic hepatitis C ([place]) 09/17/2015  . COPD (chronic obstructive pulmonary disease) (HCC)    Albuterol daily as needed and Spiriva daily  . Depression   . Diabetes mellitus type II    takes Metformin and Glipizide daily  . Dizziness   . GERD (gastroesophageal reflux disease)   . GI bleed 12/2015  . Headache    occasionally  . History of blood clots    in right leg;being followed by Dr.Brabham for this  . History of blood transfusion 09/2014   2 units-no abnormal reaction noted  . History of gastric ulcer   . History of hiatal hernia   . Hyperlipidemia 08/27/2014   takes Simvastatin daily  . Hypertension    takes Amlodipine and Lisinopril daily  . Hypothyroidism 08/27/2014   takes Synthroid daily  . Insomnia    takes Pamelor nightly  . Joint pain    hips  . Muscle spasm    takes Zanaflex daily as needed  . MVP (mitral valve prolapse)   . Peripheral edema    takes Furosemide daily as needed  . Peripheral neuropathy   . Pleural  effusion 11/2018  . Prurigo nodularis   . Stroke [facility])     Patient Active Problem List   Diagnosis Date Noted  . Weakness 01/02/2019  . Hypokalemia 01/02/2019  . Palliative care by specialist   . SOB (shortness of breath) 12/11/2018  . Elevated troponin 12/11/2018  . Pleural effusion associated with pulmonary infection   . Lung cancer ([place]) 12/08/2018  . Weakness generalized 11/30/2018  . Chronic combined systolic and diastolic CHF (congestive heart failure) ([place]) 11/30/2018  . AKI (acute kidney injury) ([place]) 11/26/2018  . Acute exacerbation of CHF (congestive heart failure) ([place]) 11/21/2018  . Encounter for antineoplastic chemotherapy 08/27/2017  . Stage III squamous cell carcinoma of left lung ([place]) 06/29/2017  . Mediastinal lymphadenopathy   . Pleural effusion   . Lung mass 04/27/2017  . Abnormal chest x-ray 04/13/2017  . Loss of weight 05/10/2016  . Grief reaction 05/10/2016  . Preop exam for internal medicine 05/10/2016  . Liver fibrosis 03/09/2016  . Smoker 01/23/2016  . Peripheral vascular disease ([place]) 01/19/2016  . GI bleed 12/08/2015  . Anemia of chronic disease 12/08/2015  . Diastolic CHF ([place]) 84/16/6063  . Hypersomnolence 12/07/2015  . Carotid stenosis, symptomatic, with infarction ([place]) 11/30/2015  . HLD (hyperlipidemia) 11/12/2015  . S/P carotid endarterectomy 11/12/2015  . Secondary cardiomyopathy ([place]) 10/14/2015  .  Acute CVA (cerebrovascular accident) ([place]) 09/28/2015  . Stroke (cerebrum) ([place]) 09/27/2015  . Facial droop 09/27/2015  . Chronic hepatitis C ([place]) 09/17/2015  . Skin lesion 09/14/2015  . Insect bite, infected 09/14/2015  . Left shoulder pain 06/08/2015  . Hypoalbuminemia 02/17/2015  . Elevated alkaline phosphatase level 02/17/2015  . Essential hypertension 02/16/2015  . Carotid stenosis 11/13/2014  . Atherosclerotic peripheral vascular disease with intermittent claudication ([place])   . Duodenal ulcer disease   . Anemia   . Tobacco abuse  09/23/2014  . Hypothyroidism 08/27/2014  . Bilateral carotid artery stenosis   . Diabetes ([place]) 05/25/2014  . Hypertensive heart disease   . History of CVA (cerebrovascular accident) without residual deficits   . Obesity (BMI 30-39.9) 01/19/2014  . Erectile dysfunction 10/10/2013  . Chronic LBP   . COPD (chronic obstructive pulmonary disease) ([place]) 03/17/2010  . GERD 03/17/2010    Past Surgical History:  Procedure Laterality Date  . ENDARTERECTOMY Left 11/13/2014   Procedure: ENDARTERECTOMY CAROTID WITH PATCH ANGIOPLASTY;  Surgeon: Serafina Mitchell, MD;  Location: [facility] OR;  Service: Vascular;  Laterality: Left;  . ESOPHAGOGASTRODUODENOSCOPY N/A 09/24/2014   Procedure: ESOPHAGOGASTRODUODENOSCOPY (EGD);  Surgeon: Winfield Cunas., MD;  Location: [facility] ENDOSCOPY;  Service: Endoscopy;  Laterality: N/A;  . FUDUCIAL PLACEMENT N/A 06/26/2017   Procedure: PLACEMENT OF FUDUCIAL;  Surgeon: Grace Isaac, MD;  Location: [facility];  Service: Thoracic;  Laterality: N/A;  . I&D of right arm    . INCISION AND DRAINAGE PERIRECTAL ABSCESS  03/2009  . IR THORACENTESIS ASP PLEURAL SPACE W/IMG GUIDE  11/21/2018  . IR THORACENTESIS ASP PLEURAL SPACE W/IMG GUIDE  12/06/2018  . IR THORACENTESIS ASP PLEURAL SPACE W/IMG GUIDE  12/11/2018  . Neg Stress Test  2000  . PERIPHERAL VASCULAR CATHETERIZATION N/A 11/30/2015   Procedure: Carotid PTA/Stent Intervention;  Surgeon: Serafina Mitchell, MD;  Location: [facility] CV LAB;  Service: Cardiovascular;  Laterality: N/A;  . ROTATOR CUFF REPAIR Right 2009   multiple f/u Sxs/infection Tx  . VASCULAR SURGERY    . VIDEO BRONCHOSCOPY WITH ENDOBRONCHIAL NAVIGATION N/A 05/25/2017   Procedure: VIDEO BRONCHOSCOPY WITH ENDOBRONCHIAL NAVIGATION;  Surgeon: Rigoberto Noel, MD;  Location: [facility];  Service: Thoracic;  Laterality: N/A;  . VIDEO BRONCHOSCOPY WITH ENDOBRONCHIAL NAVIGATION N/A 06/26/2017   Procedure: VIDEO BRONCHOSCOPY WITH ENDOBRONCHIAL NAVIGATION WITH PLACEMENT OF FIDUCIALS;   Surgeon: Grace Isaac, MD;  Location: [facility];  Service: Thoracic;  Laterality: N/A;  . VIDEO BRONCHOSCOPY WITH ENDOBRONCHIAL ULTRASOUND N/A 05/25/2017   Procedure: VIDEO BRONCHOSCOPY WITH ENDOBRONCHIAL ULTRASOUND;  Surgeon: Rigoberto Noel, MD;  Location: [facility];  Service: Thoracic;  Laterality: N/A;        Home Medications    Prior to Admission medications   Medication Sig Start Date End Date Taking? Authorizing Provider  albuterol (VENTOLIN HFA) 108 (90 Base) MCG/ACT inhaler Inhale 2 puffs into the lungs every 6 (six) hours as needed for wheezing or shortness of breath. 12/13/18   Rai, Ripudeep K, MD  amLODipine (NORVASC) 5 MG tablet Take 1 tablet (5 mg total) by mouth daily. 12/13/18 02/11/19  Rai, Vernelle Emerald, MD  aspirin EC 81 MG tablet Take 1 tablet (81 mg total) by mouth daily. 12/13/18   Rai, Vernelle Emerald, MD  Blood Pressure Monitor DEVI Use to check Blood pressure daily Patient not taking: Reported on 11/25/2018 05/25/14   Biagio Borg, MD  ferrous sulfate 325 (65 FE) MG tablet Take 1 tablet (325 mg total) by  mouth 3 (three) times daily with meals. Patient not taking: Reported on 11/25/2018 09/25/14   Regalado, Jerald Kief A, MD  furosemide (LASIX) 80 MG tablet Take 1 tablet (80 mg total) by mouth daily. 12/13/18 03/13/19  Rai, Ripudeep K, MD  glucose blood (ACCU-CHEK AVIVA PLUS) test strip Use to check blood sugars daily Dx E11.9 Patient not taking: Reported on 11/25/2018 11/09/15   Biagio Borg, MD  levothyroxine (SYNTHROID) 112 MCG tablet Take 1 tablet (112 mcg total) by mouth daily. 12/13/18   Rai, Vernelle Emerald, MD  metoprolol succinate (TOPROL-XL) 25 MG 24 hr tablet Take 1 tablet (25 mg total) by mouth daily. 12/13/18 02/11/19  Rai, Ripudeep K, MD  pantoprazole (PROTONIX) 40 MG tablet Take 1 tablet (40 mg total) by mouth daily. 12/13/18 02/11/19  Rai, Ripudeep K, MD  umeclidinium-vilanterol (ANORO ELLIPTA) 62.5-25 MCG/INH AEPB Inhale 1 puff into the lungs daily. Patient not taking: Reported on 11/25/2018  05/30/17   Rigoberto Noel, MD    Family History Family History  Problem Relation Age of Onset  . Heart disease Mother        automated implantable cardioverter-defibrillator   . Diabetes type II Mother   . Alcohol abuse Father   . Lung cancer Father   . Lung cancer Maternal Grandmother   . Lung cancer Sister     Social History Social History   Tobacco Use  . Smoking status: Current Every Day Smoker    Packs/day: 1.00    Years: 48.00    Pack years: 48.00    Types: Cigarettes  . Smokeless tobacco: Never Used  . Tobacco comment: 1-2 pks per day  Substance Use Topics  . Alcohol use: Not Currently    Alcohol/week: 1.0 standard drinks    Types: 1 Cans of beer per week    Comment: rarely  . Drug use: No     Allergies   Doxycycline; Lipitor [atorvastatin]; and Oxycodone   Review of Systems Review of Systems  Constitutional: Positive for fatigue. Negative for chills and fever.  HENT: Negative for ear pain and sore throat.   Eyes: Negative for pain and visual disturbance.  Respiratory: Negative for cough and shortness of breath.   Cardiovascular: Negative for chest pain and palpitations.  Gastrointestinal: Negative for abdominal pain and vomiting.  Genitourinary: Negative for dysuria and hematuria.  Musculoskeletal: Negative for arthralgias and back pain.  Skin: Negative for color change and rash.  Neurological: Negative for seizures and syncope.  All other systems reviewed and are negative.    Physical Exam Updated Vital Signs BP (!) 151/89 (BP Location: Right Arm)   Pulse 87   Temp 98.2 F (36.8 C) (Oral)   Resp (!) 27   SpO2 100%   Physical Exam Vitals signs and nursing note reviewed.  Constitutional:      Appearance: He is well-developed.  HENT:     Head: Normocephalic and atraumatic.  Eyes:     Conjunctiva/sclera: Conjunctivae normal.  Neck:     Musculoskeletal: Neck supple.  Cardiovascular:     Rate and Rhythm: Normal rate and regular rhythm.      Heart sounds: No murmur.  Pulmonary:     Effort: Pulmonary effort is normal. No respiratory distress.     Breath sounds: Normal breath sounds.  Abdominal:     Palpations: Abdomen is soft.     Tenderness: There is no abdominal tenderness.  Musculoskeletal: Normal range of motion.  Skin:    General: Skin is warm and dry.  Neurological:     General: No focal deficit present.     Mental Status: He is alert and oriented to person, place, and time.      ED Treatments / Results  Labs (all labs ordered are listed, but only abnormal results are displayed) Labs Reviewed  BASIC METABOLIC PANEL - Abnormal; Notable for the following components:      Result Value   Potassium 2.7 (*)    Chloride 95 (*)    Glucose, Bld 147 (*)    BUN 36 (*)    Creatinine, Ser 1.56 (*)    Calcium 8.1 (*)    GFR calc non Af Amer 47 (*)    GFR calc Af Amer 55 (*)    All other components within normal limits  CBC - Abnormal; Notable for the following components:   WBC 12.6 (*)    RBC 3.16 (*)    Hemoglobin 7.6 (*)    HCT 25.7 (*)    MCH 24.1 (*)    MCHC 29.6 (*)    RDW 16.6 (*)    All other components within normal limits  URINALYSIS, ROUTINE W REFLEX MICROSCOPIC - Abnormal; Notable for the following components:   Glucose, UA 50 (*)    Protein, ur 100 (*)    All other components within normal limits  HEPATIC FUNCTION PANEL - Abnormal; Notable for the following components:   Albumin 1.5 (*)    Alkaline Phosphatase 163 (*)    All other components within normal limits  TSH - Abnormal; Notable for the following components:   TSH 9.330 (*)    All other components within normal limits  SARS CORONAVIRUS 2 (HOSPITAL ORDER, [facility])  LIPASE, BLOOD  MAGNESIUM  BASIC METABOLIC PANEL  CBC  CBC  CREATININE, SERUM    EKG EKG Interpretation  Date/Time:  Thursday Jan 02 2019 19:55:56 EDT Ventricular Rate:  89 PR Interval:    QRS Duration: 140 QT Interval:  432 QTC  Calculation: 502 R Axis:   21 Text Interpretation:  Sinus rhythm Atrial premature complex IVCD, consider atypical RBBB LVH with secondary repolarization abnormality ST depr, consider ischemia, inferior leads Prolonged QT interval similar to previous Confirmed by Theotis Burrow [phone]) on 01/02/2019 8:22:48 PM   Radiology Ct Head Wo Contrast  Result Date: 01/02/2019 CLINICAL DATA:  Dehydration and generalized weakness. History of metastatic lung cancer. EXAM: CT HEAD WITHOUT CONTRAST TECHNIQUE: Contiguous axial images were obtained from the base of the skull through the vertex without intravenous contrast. COMPARISON:  12/12/2018 FINDINGS: Brain: Generalized atrophy. Chronic small-vessel changes of the pons. No focal cerebellar finding. Old small vessel infarctions affecting the thalami and basal ganglia. Chronic small-vessel ischemic change of the cerebral hemispheric white matter. No sign of acute infarction, mass lesion, hemorrhage, hydrocephalus or extra-axial collection. Vascular: There is atherosclerotic calcification of the major vessels at the base of the brain. Skull: Negative Sinuses/Orbits: Clear/normal Other: None IMPRESSION: No change. No acute finding. No evidence of metastatic disease. Atrophy. Old lacunar infarctions of the thalami and basal ganglia and chronic small-vessel ischemic changes of the pons and cerebral hemispheric white matter. Electronically Signed   By: Nelson Chimes M.D.   On: 01/02/2019 21:49   Dg Chest Portable 1 View  Result Date: 01/02/2019 CLINICAL DATA:  Shortness of breath. History of squamous cell carcinoma of the lung. EXAM: PORTABLE CHEST 1 VIEW COMPARISON:  Multiple prior chest x-rays and chest CT 11/30/2018 FINDINGS: Stable large left apically lung mass and  compressive left lower lobe atelectasis and left pleural effusion. The right lung is relatively clear. No obvious right lung lesions. Underlying emphysematous changes are noted. The bony thorax is intact. There  is marked elevation of the right humeral head in the glenoid fossa likely due to a large rotator cuff tear. IMPRESSION: Stable large left AP cul lung mass and left lower lobe atelectasis and effusion. Electronically Signed   By: Marijo Sanes M.D.   On: 01/02/2019 19:56    Procedures Procedures (including critical care time)  Medications Ordered in ED Medications  aspirin EC tablet 81 mg (has no administration in time range)  amLODipine (NORVASC) tablet 5 mg (has no administration in time range)  metoprolol succinate (TOPROL-XL) 24 hr tablet 25 mg (has no administration in time range)  levothyroxine (SYNTHROID) tablet 112 mcg (has no administration in time range)  pantoprazole (PROTONIX) EC tablet 40 mg (has no administration in time range)  albuterol (VENTOLIN HFA) 108 (90 Base) MCG/ACT inhaler 2 puff (has no administration in time range)  umeclidinium-vilanterol (ANORO ELLIPTA) 62.5-25 MCG/INH 1 puff (has no administration in time range)  acetaminophen (TYLENOL) tablet 650 mg (has no administration in time range)    Or  acetaminophen (TYLENOL) suppository 650 mg (has no administration in time range)  enoxaparin (LOVENOX) injection 40 mg (has no administration in time range)  sodium chloride flush (NS) 0.9 % injection 3 mL (3 mLs Intravenous Given 01/02/19 2105)  potassium chloride 10 mEq in 100 mL IVPB (0 mEq Intravenous Stopped 01/02/19 2131)  potassium chloride SA (K-DUR) CR tablet 40 mEq (40 mEq Oral Given 01/02/19 2028)  lactated ringers bolus 1,000 mL (1,000 mLs Intravenous New Bag/Given 01/02/19 2105)     Initial Impression / Assessment and Plan / ED Course  I have reviewed the triage vital signs and the nursing notes.  Pertinent labs & imaging results that were available during my care of the patient were reviewed by me and considered in my medical decision making (see chart for details).  63 year old male with a history of COPD, stage III lung cancer, CHF, DM, HTN, PVD, chronic  hepatitis C, anemia, prior CVA, DVT no longer on anticoagulation presents with weakness.  Hemodynamically stable.  Afebrile.   On exam, patient is ill-appearing.  Benign abdominal exam.  Afocal neuro exam.  Chest x-ray shows stable large left lung mass and left lower lobe atelectasis and effusion.  BMP notable for creatinine 1.56.  Potassium is 2.7.  Mag 1.8.  TSH is 9.3 which is down from 13 a month ago.  EKG normal sinus rhythm.  QTC is 502.  Premature atrial complexes.  Inverted T waves in lateral leads similar to previous.  No new ischemic changes.  Potassium repleted with both IV and PO K.  Patient admitted for further management of AKI and hypokalemia.  Inpatient team recommended CT head.  CT head shows no acute findings.  No evidence of metastatic disease.  Final Clinical Impressions(s) / ED Diagnoses   Final diagnoses:  Hypokalemia  Dehydration    ED Discharge Orders    None       Trinidad Curet, MD 01/02/19 2236    Rex Kras Wenda Overland, MD 01/03/19 [phone]

## 2019-01-02 NOTE — ED Notes (Signed)
Called pt placement. Bed was pulled and is to be placed on another floor.

## 2019-01-02 NOTE — ED Triage Notes (Signed)
[facility], sent here by medical staff for evaluation of dehydration and generalized weakness, hx of metastatic lung cancer. Pt denies any pain, pt a.o, nad noted.

## 2019-01-03 ENCOUNTER — Inpatient Hospital Stay (HOSPITAL_COMMUNITY): Payer: Medicare Other

## 2019-01-03 DIAGNOSIS — E1151 Type 2 diabetes mellitus with diabetic peripheral angiopathy without gangrene: Secondary | ICD-10-CM | POA: Diagnosis present

## 2019-01-03 DIAGNOSIS — K219 Gastro-esophageal reflux disease without esophagitis: Secondary | ICD-10-CM | POA: Diagnosis present

## 2019-01-03 DIAGNOSIS — I110 Hypertensive heart disease with heart failure: Secondary | ICD-10-CM | POA: Diagnosis present

## 2019-01-03 DIAGNOSIS — F329 Major depressive disorder, single episode, unspecified: Secondary | ICD-10-CM | POA: Diagnosis present

## 2019-01-03 DIAGNOSIS — J42 Unspecified chronic bronchitis: Secondary | ICD-10-CM

## 2019-01-03 DIAGNOSIS — Z20828 Contact with and (suspected) exposure to other viral communicable diseases: Secondary | ICD-10-CM | POA: Diagnosis present

## 2019-01-03 DIAGNOSIS — I5032 Chronic diastolic (congestive) heart failure: Secondary | ICD-10-CM | POA: Diagnosis present

## 2019-01-03 DIAGNOSIS — E876 Hypokalemia: Secondary | ICD-10-CM | POA: Diagnosis present

## 2019-01-03 DIAGNOSIS — I6529 Occlusion and stenosis of unspecified carotid artery: Secondary | ICD-10-CM | POA: Diagnosis present

## 2019-01-03 DIAGNOSIS — E875 Hyperkalemia: Secondary | ICD-10-CM | POA: Diagnosis present

## 2019-01-03 DIAGNOSIS — Z66 Do not resuscitate: Secondary | ICD-10-CM | POA: Diagnosis present

## 2019-01-03 DIAGNOSIS — E860 Dehydration: Secondary | ICD-10-CM | POA: Diagnosis present

## 2019-01-03 DIAGNOSIS — I248 Other forms of acute ischemic heart disease: Secondary | ICD-10-CM | POA: Diagnosis present

## 2019-01-03 DIAGNOSIS — D509 Iron deficiency anemia, unspecified: Secondary | ICD-10-CM | POA: Diagnosis present

## 2019-01-03 DIAGNOSIS — C799 Secondary malignant neoplasm of unspecified site: Secondary | ICD-10-CM | POA: Diagnosis present

## 2019-01-03 DIAGNOSIS — E785 Hyperlipidemia, unspecified: Secondary | ICD-10-CM | POA: Diagnosis present

## 2019-01-03 DIAGNOSIS — B182 Chronic viral hepatitis C: Secondary | ICD-10-CM | POA: Diagnosis present

## 2019-01-03 DIAGNOSIS — I429 Cardiomyopathy, unspecified: Secondary | ICD-10-CM | POA: Diagnosis present

## 2019-01-03 DIAGNOSIS — J90 Pleural effusion, not elsewhere classified: Secondary | ICD-10-CM | POA: Diagnosis not present

## 2019-01-03 DIAGNOSIS — E43 Unspecified severe protein-calorie malnutrition: Secondary | ICD-10-CM | POA: Diagnosis present

## 2019-01-03 DIAGNOSIS — D638 Anemia in other chronic diseases classified elsewhere: Secondary | ICD-10-CM | POA: Diagnosis present

## 2019-01-03 DIAGNOSIS — E039 Hypothyroidism, unspecified: Secondary | ICD-10-CM | POA: Diagnosis present

## 2019-01-03 DIAGNOSIS — M1990 Unspecified osteoarthritis, unspecified site: Secondary | ICD-10-CM | POA: Diagnosis present

## 2019-01-03 DIAGNOSIS — C3412 Malignant neoplasm of upper lobe, left bronchus or lung: Secondary | ICD-10-CM | POA: Diagnosis present

## 2019-01-03 DIAGNOSIS — J449 Chronic obstructive pulmonary disease, unspecified: Secondary | ICD-10-CM | POA: Diagnosis present

## 2019-01-03 LAB — CBC
HCT: 21.3 % — ABNORMAL LOW (ref 39.0–52.0)
Hemoglobin: 6.5 g/dL — CL (ref 13.0–17.0)
MCH: 24.3 pg — ABNORMAL LOW (ref 26.0–34.0)
MCHC: 30.5 g/dL (ref 30.0–36.0)
MCV: 79.8 fL — ABNORMAL LOW (ref 80.0–100.0)
Platelets: 300 10*3/uL (ref 150–400)
RBC: 2.67 MIL/uL — ABNORMAL LOW (ref 4.22–5.81)
RDW: 16.5 % — ABNORMAL HIGH (ref 11.5–15.5)
WBC: 10 10*3/uL (ref 4.0–10.5)
nRBC: 0 % (ref 0.0–0.2)

## 2019-01-03 LAB — BASIC METABOLIC PANEL
Anion gap: 12 (ref 5–15)
Anion gap: 9 (ref 5–15)
BUN: 32 mg/dL — ABNORMAL HIGH (ref 8–23)
BUN: 29 mg/dL — ABNORMAL HIGH (ref 8–23)
CO2: 27 mmol/L (ref 22–32)
CO2: 25 mmol/L (ref 22–32)
Calcium: 7.8 mg/dL — ABNORMAL LOW (ref 8.9–10.3)
Calcium: 7.8 mg/dL — ABNORMAL LOW (ref 8.9–10.3)
Chloride: 97 mmol/L — ABNORMAL LOW (ref 98–111)
Chloride: 99 mmol/L (ref 98–111)
Creatinine, Ser: 1.39 mg/dL — ABNORMAL HIGH (ref 0.61–1.24)
Creatinine, Ser: 1.33 mg/dL — ABNORMAL HIGH (ref 0.61–1.24)
GFR calc Af Amer: >60 mL/min (ref >60)
GFR calc Af Amer: >60 mL/min (ref >60)
GFR calc non Af Amer: 54 mL/min — ABNORMAL LOW (ref >60)
GFR calc non Af Amer: 57 mL/min — ABNORMAL LOW (ref >60)
Glucose, Bld: 147 mg/dL — ABNORMAL HIGH (ref 70–99)
Glucose, Bld: 145 mg/dL — ABNORMAL HIGH (ref 70–99)
Potassium: 2.8 mmol/L — ABNORMAL LOW (ref 3.5–5.1)
Potassium: 4.5 mmol/L (ref 3.5–5.1)
Sodium: 136 mmol/L (ref 135–145)
Sodium: 133 mmol/L — ABNORMAL LOW (ref 135–145)

## 2019-01-03 LAB — TROPONIN I
Troponin I: 0.08 ng/mL (ref <0.03)
Troponin I: 0.07 ng/mL (ref <0.03)

## 2019-01-03 LAB — IRON AND TIBC
Iron: 37 ug/dL — ABNORMAL LOW (ref 45–182)
Saturation Ratios: 24 % (ref 17.9–39.5)
TIBC: 151 ug/dL — ABNORMAL LOW (ref 250–450)
UIBC: 114 ug/dL

## 2019-01-03 LAB — FOLATE: Folate: 8.6 ng/mL (ref >5.9)

## 2019-01-03 LAB — FERRITIN: Ferritin: 337 ng/mL — ABNORMAL HIGH (ref 24–336)

## 2019-01-03 LAB — RETICULOCYTES
Immature Retic Fract: 0.7 % — ABNORMAL LOW (ref 2.3–15.9)
RBC.: 2.67 MIL/uL — ABNORMAL LOW (ref 4.22–5.81)
Retic Count, Absolute: 9.6 10*3/uL — ABNORMAL LOW (ref 19.0–186.0)
Retic Ct Pct: 0.4 % (ref 0.4–3.1)

## 2019-01-03 LAB — CK: Total CK: 19 U/L — ABNORMAL LOW (ref 49–397)

## 2019-01-03 LAB — VITAMIN B12: Vitamin B-12: 282 pg/mL (ref 180–914)

## 2019-01-03 LAB — PREPARE RBC (CROSSMATCH)

## 2019-01-03 MED ORDER — POTASSIUM CHLORIDE CRYS ER 20 MEQ PO TBCR
40.0000 meq | EXTENDED_RELEASE_TABLET | Freq: Once | ORAL | Status: AC
  Administered 2019-01-03: 40 meq via ORAL
  Filled 2019-01-03: qty 2

## 2019-01-03 MED ORDER — LIDOCAINE HCL 1 % IJ SOLN
INTRAMUSCULAR | Status: AC
  Filled 2019-01-03: qty 20

## 2019-01-03 MED ORDER — LIDOCAINE HCL (PF) 1 % IJ SOLN
INTRAMUSCULAR | Status: DC | PRN
  Administered 2019-01-03: 10 mL

## 2019-01-03 MED ORDER — SODIUM CHLORIDE 0.9% IV SOLUTION
Freq: Once | INTRAVENOUS | Status: AC
  Administered 2019-01-03: 04:00:00 via INTRAVENOUS

## 2019-01-03 MED ORDER — POTASSIUM CHLORIDE CRYS ER 20 MEQ PO TBCR
40.0000 meq | EXTENDED_RELEASE_TABLET | Freq: Four times a day (QID) | ORAL | Status: AC
  Administered 2019-01-03 (×3): 40 meq via ORAL
  Filled 2019-01-03 (×3): qty 2

## 2019-01-03 NOTE — Progress Notes (Signed)
Notified [facility] of pt critical troponin level of 0.08. Also, pt potassium level of 2.8.

## 2019-01-03 NOTE — Progress Notes (Signed)
CSW received a phone call from [place], nurse with [employer]. They are the case management services that works with the [facility]. They are trying to make an alternative placement for the patient. They are requesting his most recent MD notes and a copy of his DNR.   CSW received their release by email and will send the notes that were specified.   CSW will continue to assist with disposition.   Domenic Schwab, MSW, [place]

## 2019-01-03 NOTE — Progress Notes (Addendum)
PROGRESS NOTE  Brad [name] NOT:[id number] DOB: August 28, 1955 DOA: 01/02/2019 PCP: Biagio Borg, MD  HPI/Recap of past 24 hours: Brad [name] is a 63 y.o. male with history of metastatic lung cancer, COPD, diabetes mellitus, CHF, anemia, history of hepatitis C being treated with [facility] has been in the jail last 2 weeks recently admitted for weakness and CHF required thoracentesis presents with increasing weakness over the last 1 week.  Patient states he has been feeling weak but denies any loss of consciousness chest pain shortness of breath or diarrhea or nausea vomiting.  Denies any focal deficits.  Has been compliant with his medications.  ED Course: In the ER labs revealed sodium of 135 potassium 2.7 creatinine 1.5 which is increased from 1.23 weeks ago.  LFTs were showing alk phos of 163 albumin of 1.5.  CBC shows hemoglobin of 7.6 which is decreased from 8.  Platelets 382.  X-ray shows stable left sided mass and effusion.  EKG shows normal sinus rhythm with ST depression in the inferolateral leads.  Fluid bolus potassium replacement and admitted for generalized weakness.  COVID-19 test was negative.  Patient is afebrile.  Review of Systems: As per HPI, rest all negative.  01/03/19: Patient seen and examined at bedside.  Police officer present in the room.  Admits to persistent generalized weakness and intermittent cough.  Chest x-ray shows left upper lobe mass with left pleural effusion.  Patient denies following up with oncology however he has an appointment on 01/06/2019 with Dr. [name] after reviewing his medical records.  Assessment/Plan: Active Problems:   COPD (chronic obstructive pulmonary disease) (HCC)   Hypothyroidism   Essential hypertension   Secondary cardiomyopathy (HCC)   Stage III squamous cell carcinoma of left lung (HCC)   Weakness   Hypokalemia  Generalized weakness in the setting of left upper lobe squamous cell carcinoma Persistent generalized weakness  Presented with significant hypokalemia with potassium of 2.7, and severe anemia with hemoglobin of 6.5 Potassium is being repleted 1 unit PRBC transfusion CBC and BMP in the morning PT to assess  Metastatic squamous cell lung cancer Hospital up appointment on 01/06/2019 with Dr. [name] Dr. [name] made aware patient has been admitted Discussed with Dr. [name] who will see him outpatient  Left pleural effusion likely from malignancy Left thoracentesis, ultrasound-guided by IR Independently reviewed chest x-ray done on admission which shows large left upper lobe mass and left pleural effusion Maintain O2 sat greater than 90%  Leukocytosis, resolved Presented with WBC 12.6 WBC resolved today at 10.0 No clear signs of infection Afebrile Possibly reactive Repeat CBC tomorrow  Hypothyroidism  TSH 9.3 Unclear if patient was compliant to his Synthroid Resume home medication  Severe hypokalemia Presented with potassium 2.7 Magnesium 1.8 Repleted Repeat BMP in the morning  Microcytic anemia/iron deficiency anemia Hemoglobin 6.5 with MCV 79 Obtain FOBT Iron deficiency on iron studies Ferrous sulfate 325 twice daily  Elevated troponin Likely demand ischemia from severe anemia Denies chest pain 0.08 on presentation Trend  Hepatitis C  Follow-up with infectious disease  Social: Incarcerated and from jail   DVT prophylaxis: Lovenox. Code Status: DNR confirmed with patient. Family Communication: Discussed with patient. Disposition Plan:  Possible discharge to correctional facility tomorrow post thoracentesis and electrolyte repletion. Consults called:  Dr. [name] made aware patient has been admitted.     Objective: Vitals:   01/03/19 0417 01/03/19 0507 01/03/19 0700 01/03/19 0722  BP: (!) 156/60 (!) 157/67 (!) 161/66   Pulse: 80 72  72   Resp: 18 16 18    Temp: 99 F (37.2 C) 99.4 F (37.4 C) 98.7 F (37.1 C)   TempSrc: Oral Oral Oral   SpO2: 99% 97% 96%  97%  Weight:      Height:        Intake/Output Summary (Last 24 hours) at 01/03/2019 1311 Last data filed at 01/03/2019 0900 Gross per 24 hour  Intake 867 ml  Output -  Net 867 ml   Filed Weights   01/03/19 0013  Weight: 84.9 kg    Exam:  . General: 63 y.o. year-old male well developed well nourished in no acute distress.  Alert and oriented x3. . Cardiovascular: Regular rate and rhythm with no rubs or gallops.  No thyromegaly or JVD noted.   Marland Kitchen Respiratory: Mild rales at bases with no wheezes. Poor inspiratory effort. . Abdomen: Soft nontender nondistended with normal bowel sounds x4 quadrants. . Musculoskeletal: Trace lower extremity edema. 2/4 pulses in all 4 extremities. Marland Kitchen Psychiatry: Mood is appropriate for condition and setting   Data Reviewed: CBC: Recent Labs  Lab 01/02/19 1818 01/03/19 0203  WBC 12.6* 10.0  HGB 7.6* 6.5*  HCT 25.7* 21.3*  MCV 81.3 79.8*  PLT 382 381   Basic Metabolic Panel: Recent Labs  Lab 01/02/19 1818 01/02/19 1939 01/03/19 0203  NA 135  --  136  K 2.7*  --  2.8*  CL 95*  --  97*  CO2 27  --  27  GLUCOSE 147*  --  147*  BUN 36*  --  32*  CREATININE 1.56*  --  1.39*  CALCIUM 8.1*  --  7.8*  MG  --  1.8  --    GFR: Estimated Creatinine Clearance: 59.4 mL/min (A) (by C-G formula based on SCr of 1.39 mg/dL (H)). Liver Function Tests: Recent Labs  Lab 01/02/19 1909  AST 26  ALT 18  ALKPHOS 163*  BILITOT 0.6  PROT 7.0  ALBUMIN 1.5*   Recent Labs  Lab 01/02/19 1909  LIPASE 30   No results for input(s): AMMONIA in the last 168 hours. Coagulation Profile: No results for input(s): INR, PROTIME in the last 168 hours. Cardiac Enzymes: Recent Labs  Lab 01/03/19 0203  CKTOTAL 19*  TROPONINI 0.08*   BNP (last 3 results) No results for input(s): PROBNP in the last 8760 hours. HbA1C: No results for input(s): HGBA1C in the last 72 hours. CBG: No results for input(s): GLUCAP in the last 168 hours. Lipid Profile: No  results for input(s): CHOL, HDL, LDLCALC, TRIG, CHOLHDL, LDLDIRECT in the last 72 hours. Thyroid Function Tests: Recent Labs    01/02/19 1939  TSH 9.330*   Anemia Panel: Recent Labs    01/03/19 0203  VITAMINB12 282  FOLATE 8.6  FERRITIN 337*  TIBC 151*  IRON 37*  RETICCTPCT 0.4   Urine analysis:    Component Value Date/Time   COLORURINE YELLOW 01/02/2019 2020   APPEARANCEUR CLEAR 01/02/2019 2020   LABSPEC 1.013 01/02/2019 2020   PHURINE 5.0 01/02/2019 2020   GLUCOSEU 50 (A) 01/02/2019 2020   GLUCOSEU NEGATIVE 05/10/2016 1434   HGBUR NEGATIVE 01/02/2019 2020   BILIRUBINUR NEGATIVE 01/02/2019 2020   KETONESUR NEGATIVE 01/02/2019 2020   PROTEINUR 100 (A) 01/02/2019 2020   UROBILINOGEN 0.2 05/10/2016 1434   NITRITE NEGATIVE 01/02/2019 2020   LEUKOCYTESUR NEGATIVE 01/02/2019 2020   Sepsis Labs: @LABRCNTIP (procalcitonin:4,lacticidven:4)  ) Recent Results (from the past 240 hour(s))  SARS Coronavirus 2 (CEPHEID - Performed in [facility]  hospital lab), Hosp Order     Status: None   Collection Time: 01/02/19  9:05 PM  Result Value Ref Range Status   SARS Coronavirus 2 NEGATIVE NEGATIVE Final    Comment: (NOTE) If result is NEGATIVE SARS-CoV-2 target nucleic acids are NOT DETECTED. The SARS-CoV-2 RNA is generally detectable in upper and lower  respiratory specimens during the acute phase of infection. The lowest  concentration of SARS-CoV-2 viral copies this assay can detect is 250  copies / mL. A negative result does not preclude SARS-CoV-2 infection  and should not be used as the sole basis for treatment or other  patient management decisions.  A negative result may occur with  improper specimen collection / handling, submission of specimen other  than nasopharyngeal swab, presence of viral mutation(s) within the  areas targeted by this assay, and inadequate number of viral copies  (<250 copies / mL). A negative result must be combined with clinical  observations,  patient history, and epidemiological information. If result is POSITIVE SARS-CoV-2 target nucleic acids are DETECTED. The SARS-CoV-2 RNA is generally detectable in upper and lower  respiratory specimens dur ing the acute phase of infection.  Positive  results are indicative of active infection with SARS-CoV-2.  Clinical  correlation with patient history and other diagnostic information is  necessary to determine patient infection status.  Positive results do  not rule out bacterial infection or co-infection with other viruses. If result is PRESUMPTIVE POSTIVE SARS-CoV-2 nucleic acids MAY BE PRESENT.   A presumptive positive result was obtained on the submitted specimen  and confirmed on repeat testing.  While 2019 novel coronavirus  (SARS-CoV-2) nucleic acids may be present in the submitted sample  additional confirmatory testing may be necessary for epidemiological  and / or clinical management purposes  to differentiate between  SARS-CoV-2 and other Sarbecovirus currently known to infect humans.  If clinically indicated additional testing with an alternate test  methodology [phone]) is advised. The SARS-CoV-2 RNA is generally  detectable in upper and lower respiratory sp ecimens during the acute  phase of infection. The expected result is Negative. Fact Sheet for Patients:  StrictlyIdeas.no Fact Sheet for Healthcare Providers: BankingDealers.co.za This test is not yet approved or cleared by the [place] FDA and has been authorized for detection and/or diagnosis of SARS-CoV-2 by FDA under an Emergency Use Authorization (EUA).  This EUA will remain in effect (meaning this test can be used) for the duration of the COVID-19 declaration under Section 564(b)(1) of the Act, 21 U.S.C. section 360bbb-3(b)(1), unless the authorization is terminated or revoked sooner. Performed at [facility], [place] [address], [place], [place]  [address]       Studies: Ct Head Wo Contrast  Result Date: 01/02/2019 CLINICAL DATA:  Dehydration and generalized weakness. History of metastatic lung cancer. EXAM: CT HEAD WITHOUT CONTRAST TECHNIQUE: Contiguous axial images were obtained from the base of the skull through the vertex without intravenous contrast. COMPARISON:  12/12/2018 FINDINGS: Brain: Generalized atrophy. Chronic small-vessel changes of the pons. No focal cerebellar finding. Old small vessel infarctions affecting the thalami and basal ganglia. Chronic small-vessel ischemic change of the cerebral hemispheric white matter. No sign of acute infarction, mass lesion, hemorrhage, hydrocephalus or extra-axial collection. Vascular: There is atherosclerotic calcification of the major vessels at the base of the brain. Skull: Negative Sinuses/Orbits: Clear/normal Other: None IMPRESSION: No change. No acute finding. No evidence of metastatic disease. Atrophy. Old lacunar infarctions of the thalami and basal ganglia and chronic  small-vessel ischemic changes of the pons and cerebral hemispheric white matter. Electronically Signed   By: Nelson Chimes M.D.   On: 01/02/2019 21:49   Dg Chest Portable 1 View  Result Date: 01/02/2019 CLINICAL DATA:  Shortness of breath. History of squamous cell carcinoma of the lung. EXAM: PORTABLE CHEST 1 VIEW COMPARISON:  Multiple prior chest x-rays and chest CT 11/30/2018 FINDINGS: Stable large left apically lung mass and compressive left lower lobe atelectasis and left pleural effusion. The right lung is relatively clear. No obvious right lung lesions. Underlying emphysematous changes are noted. The bony thorax is intact. There is marked elevation of the right humeral head in the glenoid fossa likely due to a large rotator cuff tear. IMPRESSION: Stable large left AP cul lung mass and left lower lobe atelectasis and effusion. Electronically Signed   By: Marijo Sanes M.D.   On: 01/02/2019 19:56    Scheduled Meds: .  amLODipine  5 mg Oral Daily  . aspirin EC  81 mg Oral Daily  . enoxaparin (LOVENOX) injection  40 mg Subcutaneous Q24H  . levothyroxine  112 mcg Oral Q0600  . metoprolol succinate  25 mg Oral Daily  . pantoprazole  40 mg Oral Daily  . potassium chloride  40 mEq Oral Q6H  . umeclidinium-vilanterol  1 puff Inhalation Daily    Continuous Infusions:   LOS: 0 days     Kayleen Memos, MD [facility] Pager [phone]  If 7PM-7AM, please contact night-coverage [email or web address] Password [email or web address] 01/03/2019, 1:11 PM

## 2019-01-03 NOTE — Procedures (Signed)
PROCEDURE SUMMARY:  Attempted US guided left thoracentesis. Yielded no fluid. Patient tolerated procedure well. No immediate complications. EBL = trace  Post procedure chest X-ray reveals no pneumothorax  Ayeisha Lindenberger S Tacia Hindley PA-C 01/03/2019 4:40 PM

## 2019-01-03 NOTE — Progress Notes (Signed)
Pt back from IR 

## 2019-01-03 NOTE — TOC Initial Note (Addendum)
Transition of Care [facility]) - Initial/Assessment Note    Patient Details  Name: Brad [name] MRN: [id number] Date of Birth: 1956/01/16  [facility] CM/SW Contact:    Marilu Favre, RN Phone Number: 01/03/2019, 1:07 PM  Clinical Narrative:                 Patient recently discharged with [facility] and [facility], now in custody with Trooper. Guard at bedside will call and have Order of Disclosure faxed to 6N.  Malachy Mood with [facility] aware of admission. Prior to patient being picked by police, [facility] had approved him and their social workers were working on a more permanent housing situation.  Durenda Age also aware   Expected Discharge Plan: Corrections Facility Barriers to Discharge: Continued Medical Work up   Patient Goals and CMS Choice        Expected Discharge Plan and Services Expected Discharge Plan: Regulatory affairs officer                                              Prior Living Arrangements/Services                       Activities of Daily Living Home Assistive Devices/Equipment: None ADL Screening (condition at time of admission) Patient's cognitive ability adequate to safely complete daily activities?: Yes Is the patient deaf or have difficulty hearing?: No Does the patient have difficulty seeing, even when wearing glasses/contacts?: No Does the patient have difficulty concentrating, remembering, or making decisions?: No Patient able to express need for assistance with ADLs?: Yes Does the patient have difficulty dressing or bathing?: No Independently performs ADLs?: Yes (appropriate for developmental age) Does the patient have difficulty walking or climbing stairs?: No Weakness of Legs: None Weakness of Arms/Hands: None  Permission Sought/Granted                  Emotional Assessment              Admission diagnosis:  Dehydration [E86.0] Hypokalemia  [E87.6] Weakness [R53.1] Patient Active Problem List   Diagnosis Date Noted  . Weakness 01/02/2019  . Hypokalemia 01/02/2019  . Palliative care by specialist   . SOB (shortness of breath) 12/11/2018  . Elevated troponin 12/11/2018  . Pleural effusion associated with pulmonary infection   . Lung cancer ([place]) 12/08/2018  . Weakness generalized 11/30/2018  . Chronic combined systolic and diastolic CHF (congestive heart failure) ([place]) 11/30/2018  . AKI (acute kidney injury) ([place]) 11/26/2018  . Acute exacerbation of CHF (congestive heart failure) ([place]) 11/21/2018  . Encounter for antineoplastic chemotherapy 08/27/2017  . Stage III squamous cell carcinoma of left lung ([place]) 06/29/2017  . Mediastinal lymphadenopathy   . Pleural effusion   . Lung mass 04/27/2017  . Abnormal chest x-ray 04/13/2017  . Loss of weight 05/10/2016  . Grief reaction 05/10/2016  . Preop exam for internal medicine 05/10/2016  . Liver fibrosis 03/09/2016  . Smoker 01/23/2016  . Peripheral vascular disease ([place]) 01/19/2016  . GI bleed 12/08/2015  . Anemia of chronic disease 12/08/2015  . Diastolic CHF ([place]) 78/93/8101  . Hypersomnolence 12/07/2015  . Carotid stenosis, symptomatic, with infarction ([place]) 11/30/2015  . HLD (hyperlipidemia) 11/12/2015  . S/P carotid endarterectomy 11/12/2015  . Secondary cardiomyopathy ([place]) 10/14/2015  . Acute CVA (cerebrovascular accident) ([place]) 09/28/2015  .  Stroke (cerebrum) ([place]) 09/27/2015  . Facial droop 09/27/2015  . Chronic hepatitis C ([place]) 09/17/2015  . Skin lesion 09/14/2015  . Insect bite, infected 09/14/2015  . Left shoulder pain 06/08/2015  . Hypoalbuminemia 02/17/2015  . Elevated alkaline phosphatase level 02/17/2015  . Essential hypertension 02/16/2015  . Carotid stenosis 11/13/2014  . Atherosclerotic peripheral vascular disease with intermittent claudication ([place])   . Duodenal ulcer disease   . Anemia   . Tobacco abuse 09/23/2014  . Hypothyroidism  08/27/2014  . Bilateral carotid artery stenosis   . Diabetes ([place]) 05/25/2014  . Hypertensive heart disease   . History of CVA (cerebrovascular accident) without residual deficits   . Obesity (BMI 30-39.9) 01/19/2014  . Erectile dysfunction 10/10/2013  . Chronic LBP   . COPD (chronic obstructive pulmonary disease) ([place]) 03/17/2010  . GERD 03/17/2010   PCP:  Biagio Borg, MD Pharmacy:  No Pharmacies Listed    Social Determinants of Health (SDOH) Interventions    Readmission Risk Interventions Readmission Risk Prevention Plan 12/06/2018 12/04/2018  Transportation Screening - Complete  HRI or [place] - Complete  Social Work Consult for [place] Planning/Counseling Complete -  Palliative Care Screening - Complete  Medication Review Press photographer) - Complete  Some recent data might be hidden

## 2019-01-03 NOTE — Progress Notes (Signed)
Notified [facility] of pt critical hemoglobin of 6.5.

## 2019-01-03 NOTE — Progress Notes (Signed)
Pt transfer to IR for thoracentesis.

## 2019-01-03 NOTE — Progress Notes (Signed)
Patient arrived to unit to room 16.

## 2019-01-04 LAB — HEMOGLOBIN AND HEMATOCRIT, BLOOD
HCT: 23.9 % — ABNORMAL LOW (ref 39.0–52.0)
Hemoglobin: 7.4 g/dL — ABNORMAL LOW (ref 13.0–17.0)

## 2019-01-04 LAB — BPAM RBC
Blood Product Expiration Date: 202006112359
ISSUE DATE / TIME: 202005290343
Unit Type and Rh: 7300

## 2019-01-04 LAB — BASIC METABOLIC PANEL
Anion gap: 8 (ref 5–15)
BUN: 28 mg/dL — ABNORMAL HIGH (ref 8–23)
CO2: 27 mmol/L (ref 22–32)
Calcium: 8.2 mg/dL — ABNORMAL LOW (ref 8.9–10.3)
Chloride: 99 mmol/L (ref 98–111)
Creatinine, Ser: 1.23 mg/dL (ref 0.61–1.24)
GFR calc Af Amer: >60 mL/min (ref >60)
GFR calc non Af Amer: >60 mL/min (ref >60)
Glucose, Bld: 106 mg/dL — ABNORMAL HIGH (ref 70–99)
Potassium: 4.9 mmol/L (ref 3.5–5.1)
Sodium: 134 mmol/L — ABNORMAL LOW (ref 135–145)

## 2019-01-04 LAB — TYPE AND SCREEN
ABO/RH(D): B POS
Antibody Screen: NEGATIVE
Unit division: 0

## 2019-01-04 MED ORDER — FUROSEMIDE 40 MG PO TABS: 40.0000 mg | ORAL_TABLET | Freq: Every day | ORAL | Status: DC

## 2019-01-04 MED ORDER — FERROUS SULFATE 325 (65 FE) MG PO TABS
325.0000 mg | ORAL_TABLET | Freq: Three times a day (TID) | ORAL | Status: DC
  Administered 2019-01-04 (×3): 325 mg via ORAL
  Filled 2019-01-04 (×3): qty 1

## 2019-01-04 MED ORDER — FUROSEMIDE 40 MG PO TABS: 40.0000 mg | ORAL_TABLET | Freq: Every day | ORAL | 0 refills | Status: AC

## 2019-01-04 MED ORDER — UMECLIDINIUM-VILANTEROL 62.5-25 MCG/INH IN AEPB: 1.0000 | INHALATION_SPRAY | Freq: Every day | RESPIRATORY_TRACT | 0 refills | Status: AC

## 2019-01-04 MED ORDER — AMLODIPINE BESYLATE 10 MG PO TABS
10.0000 mg | ORAL_TABLET | Freq: Every day | ORAL | Status: DC
  Administered 2019-01-04: 10 mg via ORAL
  Filled 2019-01-04: qty 1

## 2019-01-04 MED ORDER — FUROSEMIDE 80 MG PO TABS: 80.0000 mg | ORAL_TABLET | Freq: Every day | ORAL | Status: DC

## 2019-01-04 MED ORDER — POTASSIUM CHLORIDE CRYS ER 20 MEQ PO TBCR: 20.0000 meq | EXTENDED_RELEASE_TABLET | Freq: Every day | ORAL | 0 refills | Status: AC

## 2019-01-04 MED ORDER — POTASSIUM CHLORIDE CRYS ER 20 MEQ PO TBCR
20.0000 meq | EXTENDED_RELEASE_TABLET | Freq: Every day | ORAL | Status: DC
  Administered 2019-01-04: 20 meq via ORAL
  Filled 2019-01-04: qty 1

## 2019-01-04 MED ORDER — AMLODIPINE BESYLATE 10 MG PO TABS: 10.0000 mg | ORAL_TABLET | Freq: Every day | ORAL | 0 refills | Status: AC

## 2019-01-04 MED ORDER — FUROSEMIDE 40 MG PO TABS
40.0000 mg | ORAL_TABLET | Freq: Every day | ORAL | Status: DC
  Administered 2019-01-04: 40 mg via ORAL
  Filled 2019-01-04: qty 1

## 2019-01-04 NOTE — TOC Transition Note (Signed)
Transition of Care [facility]) - CM/SW Discharge Note   Patient Details  Name: Brad [name] MRN: [id number] Date of Birth: 01-Dec-1955  Transition of Care [facility]) CM/SW Contact:  Claudie Leach, RN Phone Number: 01/04/2019, 2:19 PM   Clinical Narrative:       Final next level of care: Corrections Facility Barriers to Discharge: Continued Medical Work up   Discharge Plan and Services                DME Arranged: Oxygen DME Agency: [employer] Date DME Agency Contacted: 01/04/19 Time DME Agency Contacted: [id number] Representative spoke with at DME Agency: [place] ([place]) Interventions     Readmission Risk Interventions Readmission Risk Prevention Plan 12/06/2018 12/04/2018  Transportation Screening - Complete  HRI or [place] - Complete  Social Work Consult for [place] Planning/Counseling Complete -  Palliative Care Screening - Complete  Medication Review Press photographer) - Complete  Some recent data might be hidden

## 2019-01-04 NOTE — Evaluation (Addendum)
Physical Therapy Evaluation Patient Details Name: Brad [name] MRN: [id number] DOB: [birth year] Today's Date: 01/04/2019   History of Present Illness  Brad [name] is a 63 y.o. male with history of metastatic lung cancer, COPD, diabetes mellitus, CHF, anemia, history of hepatitis C who has been in the jail last 2 weeks admitted for weakness and CHF required thoracentesis presents with increasing weakness over the last 1 week.    Clinical Impression  Patient presents with dependencies in gait and mobility due to generalized weakness and decreased balance.  Patient would benefit from f/up PT at discharge.  Patient also incontinent of urine throughout session (laying in wet bed, urinated while sitting in chair for bed to be changed - appeared unaware he was urinating).  Will continue to follow while in hospital.  Feel patient will need assistance at discharge for mobility.    Follow Up Recommendations Other (comment);SNF;Supervision/Assistance - 24 hour(f/up PT in correctional facility if possible)    Equipment Recommendations  Rolling walker with 5" wheels    Recommendations for Other Services       Precautions / Restrictions Precautions Precautions: Fall Restrictions Weight Bearing Restrictions: No      Mobility  Bed Mobility Overal bed mobility: Needs Assistance Bed Mobility: Supine to Sit     Supine to sit: Min assist Sit to supine: Supervision   General bed mobility comments: needed assist to lift shoulders off bed  Transfers Overall transfer level: Needs assistance Equipment used: Rolling walker (2 wheeled) Transfers: Sit to/from Stand Sit to Stand: Min assist         General transfer comment: min assist to power up to standing  Ambulation/Gait Ambulation/Gait assistance: Min guard Gait Distance (Feet): 60 Feet Assistive device: Rolling walker (2 wheeled) Gait Pattern/deviations: Step-through pattern     General Gait Details: gait limited by shackles  on LE's; pt steady with RW  Stairs            Wheelchair Mobility    Modified Rankin (Stroke Patients Only)       Balance Overall balance assessment: Needs assistance Sitting-balance support: No upper extremity supported;Feet supported Sitting balance-Leahy Scale: Good     Standing balance support: Bilateral upper extremity supported;During functional activity Standing balance-Leahy Scale: Poor Standing balance comment: reliant on RW for balance                             Pertinent Vitals/Pain Pain Assessment: No/denies pain    Home Living Family/patient expects to be discharged to:: [facility]ison                      Prior Function Level of Independence: Independent               Hand Dominance        Extremity/Trunk Assessment   Upper Extremity Assessment Upper Extremity Assessment: LUE deficits/detail;RUE deficits/detail RUE Deficits / Details: grimaces with shoulder flexion, AROM to 100 degrees LUE Deficits / Details: grimaces with shoulder flexion, AROM to 100 degrees    Lower Extremity Assessment Lower Extremity Assessment: Generalized weakness       Communication   Communication: No difficulties  Cognition Arousal/Alertness: Awake/alert Behavior During Therapy: Flat affect Overall Cognitive Status: No family/caregiver present to determine baseline cognitive functioning                         Following Commands: Follows one step commands  consistently       General Comments: appeared unaware he was laying in urine and appeared unaware he was urinating while sitting in chair.      General Comments      Exercises     Assessment/Plan    PT Assessment Patient needs continued PT services  PT Problem List Decreased strength;Decreased activity tolerance;Decreased balance;Decreased mobility       PT Treatment Interventions DME instruction;Gait training;Therapeutic activities;Functional mobility  training;Therapeutic exercise;Balance training;Patient/family education;Cognitive remediation    PT Goals (Current goals can be found in the Care Plan section)  Acute Rehab PT Goals Patient Stated Goal: non stated PT Goal Formulation: With patient Time For Goal Achievement: 01/11/19 Potential to Achieve Goals: Fair    Frequency Min 3X/week   Barriers to discharge        Co-evaluation               AM-PAC PT "6 Clicks" Mobility  Outcome Measure Help needed turning from your back to your side while in a flat bed without using bedrails?: A Little Help needed moving from lying on your back to sitting on the side of a flat bed without using bedrails?: A Little Help needed moving to and from a bed to a chair (including a wheelchair)?: A Little Help needed standing up from a chair using your arms (e.g., wheelchair or bedside chair)?: A Little Help needed to walk in hospital room?: A Little Help needed climbing 3-5 steps with a railing? : A Little 6 Click Score: 18    End of Session   Activity Tolerance: Patient tolerated treatment well Patient left: in bed;with call bell/phone within reach(with police officer present)   PT Visit Diagnosis: Unsteadiness on feet (R26.81);Muscle weakness (generalized) (M62.81)    Time: 4496-7591 PT Time Calculation (min) (ACUTE ONLY): 18 min   Charges:   PT Evaluation $PT Eval Moderate Complexity: 1 Mod          01/04/2019 [place], PT Acute Rehabilitation Services Pager:  [phone] Office:  [place], [place] 01/04/2019, 12:15 PM

## 2019-01-04 NOTE — Progress Notes (Signed)
Brad [name] to be D/C'd  per MD order.   An After Visit Summary and printed prescriptions were given to Officer Henrene Pastor of correction facility.    Patient instructed to return to ED, call 911, or call MD for any changes in condition.   Patient to be transported back to correctional facility.

## 2019-01-04 NOTE — Progress Notes (Signed)
SATURATION QUALIFICATIONS: (This note is used to comply with regulatory documentation for home oxygen)  Patient Saturations on Room Air at Rest = 94%  Patient Saturations on Room Air while Ambulating = 88%  Patient Saturations on 2 Liters of oxygen while Ambulating = 97%  Please briefly explain why patient needs home oxygen:  Without supplemental O2, sats decrease below 88%

## 2019-01-04 NOTE — Discharge Instructions (Signed)
Potassium Content of Foods    Potassium is a mineral found in many foods and drinks. It affects how the heart works, and helps keep fluids and minerals balanced in the body.  The amount of potassium you need each day depends on your age and any medical conditions you may have. Talk to your health care provider or dietitian about how much potassium you need.  The following lists of foods provide the general serving size for foods and the approximate amount of potassium in each serving, listed in milligrams (mg). Actual values may vary depending on the product and how it is processed.  High in potassium  The following foods and beverages have 200 mg or more of potassium per serving:  · Apricots (raw) - 2 have 200 mg of potassium.  · Apricots (dry) - 5 have 200 mg of potassium.  · Artichoke - 1 medium has 345 mg of potassium.  · Avocado - ¼ fruit has 245 mg of potassium.  · Banana - 1 medium fruit has 425 mg of potassium.  · Lima or baked beans (canned) - ½ cup has 280 mg of potassium.  · White beans (canned) - ½ cup has 595 mg potassium.  · Beef roast - 3 oz has 320 mg of potassium.  · Ground beef - 3 oz has 270 mg of potassium.  · Beets (raw or cooked) - ½ cup has 260 mg of potassium.  · Bran muffin - 2 oz has 300 mg of potassium.  · Broccoli (cooked) - ½ cup has 230 mg of potassium.  · Brussels sprouts - ½ cup has 250 mg of potassium.  · Cantaloupe - ½ cup has 215 mg of potassium.  · Cereal, 100% bran - ½ cup has 200-400 mg of potassium.  · Cheeseburger -1 single fast food burger has 225-400 mg of potassium.  · Chicken - 3 oz has 220 mg of potassium.  · Clams (canned) - 3 oz has 535 mg of potassium.  · Crab - 3 oz has 225 mg of potassium.  · Dates - 5 have 270 mg of potassium.  · Dried beans and peas - ½ cup has 300-475 mg of potassium.  · Figs (dried) - 2 have 260 mg of potassium.  · Fish (halibut, tuna, cod, snapper) - 3 oz has 480 mg of potassium.  · Fish (salmon, haddock, swordfish, perch) - 3 oz has 300 mg of  potassium.  · Fish (tuna, canned) - 3 oz has 200 mg of potassium.  · French fries (fast food) - 3 oz has 470 mg of potassium.  · Granola with fruit and nuts - ½ cup has 200 mg of potassium.  · Grapefruit juice - ½ cup has 200 mg of potassium.  · Honeydew melon - ½ cup has 200 mg of potassium.  · Kale (raw) - 1 cup has 300 mg of potassium.  · Kiwi - 1 medium fruit has 240 mg of potassium.  · Kohlrabi, rutabaga, parsnips - ½ cup has 280 mg of potassium.  · Lentils - ½ cup has 365 mg of potassium.  · Mango - 1 each has 325 mg of potassium.  · Milk (nonfat, low-fat, whole, buttermilk) - 1 cup has 350-380 mg of potassium.  · Milk (chocolate) - 1 cup has 420 mg of potassium  · Molasses - 1 Tbsp has 295 mg of potassium.  · Mushrooms - ½ cup has 280 mg of potassium.  · Nectarine - 1 each has   275 mg of potassium.  · Nuts (almonds, peanuts, hazelnuts, [place], cashew, mixed) - 1 oz has 200 mg of potassium.  · Nuts (pistachios) - 1 oz has 295 mg of potassium.  · Orange - 1 fruit has 240 mg of potassium.  · Orange juice - ½ cup has 235 mg of potassium.  · Papaya - ½ medium fruit has 390 mg of potassium.  · Peanut butter (chunky) - 2 Tbsp has 240 mg of potassium.  · Peanut butter (smooth) - 2 Tbsp has 210 mg of potassium.  · Pear - 1 medium (200 mg) of potassium.  · Pomegranate - 1 whole fruit has 400 mg of potassium.  · Pomegranate juice - ½ cup has 215 mg of potassium.  · Pork - 3 oz has 350 mg of potassium.  · Potato chips (salted) - 1 oz has 465 mg of potassium.  · Potato (baked with skin) - 1 medium has 925 mg of potassium.  · Potato (boiled) - ½ cup has 255 mg of potassium.  · Potato (Mashed) - ½ cup has 330 mg of potassium.  · Prune juice - ½ cup has 370 mg of potassium.  · Prunes - 5 have 305 mg of potassium.  · Pudding (chocolate) - ½ cup has 230 mg of potassium.  · Pumpkin (canned) - ½ cup has 250 mg of potassium.  · Raisins (seedless) - ¼ cup has 270 mg of potassium.  · Seeds (sunflower or pumpkin) - 1 oz has 240 mg of  potassium.  · Soy milk - 1 cup has 300 mg of potassium.  · Spinach (cooked) - 1/2 cup has 420 mg of potassium.  · Spinach (canned) - ½ cup has 370 mg of potassium.  · Sweet potato (baked with skin) - 1 medium has 450 mg of potassium.  · Swiss chard - ½ cup has 480 mg of potassium.  · Tomato or vegetable juice - ½ cup has 275 mg of potassium.  · Tomato (sauce or puree) - ½ cup has 400-550 mg of potassium.  · Tomato (raw) - 1 medium has 290 mg of potassium.  · Tomato (canned) - ½ cup has 200-300 mg of potassium.  · Turkey - 3 oz has 250 mg of potassium.  · Wheat germ - 1 oz has 250 mg of potassium.  · Winter squash - ½ cup has 250 mg of potassium.  · Yogurt (plain or fruited) - 6 oz has 260-435 mg of potassium.  · Zucchini - ½ cup has 220 mg of potassium.  Moderate in potassium  The following foods and beverages have 50-200 mg of potassium per serving:  · Apple - 1 fruit has 150 mg of potassium  · Apple juice - ½ cup has 150 mg of potassium  · Applesauce - ½ cup has 90 mg of potassium  · Apricot nectar - ½ cup has 140 mg of potassium  · Asparagus (small spears) - ½ cup has 155 mg of potassium  · Asparagus (large spears) - 6 have 155 mg of potassium  · Bagel (cinnamon raisin) - 1 four-inch bagel has 130 mg of potassium  · Bagel (egg or plain) - 1 four- inch bagel has 70 mg of potassium  · Beans (green) - ½ cup has 90 mg of potassium  · Beans (yellow) - ½ cup has 190 mg of potassium  · Beer, regular - 12 oz has 100 mg of potassium  · Beets (canned) - ½ cup has   125 mg of potassium  · Blackberries - ½ cup has 115 mg of potassium  · Blueberries - ½ cup has 60 mg of potassium  · Bread (whole wheat) - 1 slice has 70 mg of potassium  · Broccoli (raw) - ½ cup has 145 mg of potassium  · Cabbage - ½ cup has 150 mg of potassium  · Carrots (cooked or raw) - ½ cup has 180 mg of potassium  · Cauliflower (raw) - ½ cup has 150 mg of potassium  · Celery (raw) - ½ cup has 155 mg of potassium  · Cereal, bran flakes - ½ cup has 120-150 mg  of potassium  · Cheese (cottage) - ½ cup has 110 mg of potassium  · Cherries - 10 have 150 mg of potassium  · Chocolate - 1½ oz bar has 165 mg of potassium  · Coffee (brewed) - 6 oz has 90 mg of potassium  · Corn - ½ cup or 1 ear has 195 mg of potassium  · Cucumbers - ½ cup has 80 mg of potassium  · Egg - 1 large egg has 60 mg of potassium  · Eggplant - ½ cup has 60 mg of potassium  · Endive (raw) - ½ cup has 80 mg of potassium  · English muffin - 1 has 65 mg of potassium  · Fish (ocean perch) - 3 oz has 192 mg of potassium  · Frankfurter, beef or pork - 1 has 75 mg of potassium  · Fruit cocktail - ½ cup has 115 mg of potassium  · Grape juice - ½ cup has 170 mg of potassium  · Grapefruit - ½ fruit has 175 mg of potassium  · Grapes - ½ cup has 155 mg of potassium  · Greens: kale, turnip, collard - ½ cup has 110-150 mg of potassium  · Ice cream or frozen yogurt (chocolate) - ½ cup has 175 mg of potassium  · Ice cream or frozen yogurt (vanilla) - ½ cup has 120-150 mg of potassium  · Lemons, limes - 1 each has 80 mg of potassium  · Lettuce - 1 cup has 100 mg of potassium  · Mixed vegetables - ½ cup has 150 mg of potassium  · Mushrooms, raw - ½ cup has 110 mg of potassium  · Nuts (walnuts, pecans, or macadamia) - 1 oz has 125 mg of potassium  · Oatmeal - ½ cup has 80 mg of potassium  · Okra - ½ cup has 110 mg of potassium  · Onions - ½ cup has 120 mg of potassium  · Peach - 1 has 185 mg of potassium  · Peaches (canned) - ½ cup has 120 mg of potassium  · Pears (canned) - ½ cup has 120 mg of potassium  · Peas, green (frozen) - ½ cup has 90 mg of potassium  · Peppers (Green) - ½ cup has 130 mg of potassium  · Peppers (Red) - ½ cup has 160 mg of potassium  · Pineapple juice - ½ cup has 165 mg of potassium  · Pineapple (fresh or canned) - ½ cup has 100 mg of potassium  · Plums - 1 has 105 mg of potassium  · Pudding, vanilla - ½ cup has 150 mg of potassium  · Raspberries - ½ cup has 90 mg of potassium  · Rhubarb - ½ cup has  115 mg of potassium  · Rice, wild - ½ cup has 80 mg of potassium  ·   Shrimp - 3 oz has 155 mg of potassium  · Spinach (raw) - 1 cup has 170 mg of potassium  · Strawberries - ½ cup has 125 mg of potassium  · Summer squash - ½ cup has 175-200 mg of potassium  · Swiss chard (raw) - 1 cup has 135 mg of potassium  · Tangerines - 1 fruit has 140 mg of potassium  · Tea, brewed - 6 oz has 65 mg of potassium  · Turnips - ½ cup has 140 mg of potassium  · Watermelon - ½ cup has 85 mg of potassium  · Wine (Red, table) - 5 oz has 180 mg of potassium  · Wine (White, table) - 5 oz 100 mg of potassium  Low in potassium  The following foods and beverages have less than 50 mg of potassium per serving.  · Bread (white) - 1 slice has 30 mg of potassium  · Carbonated beverages - 12 oz has less than 5 mg of potassium  · Cheese - 1 oz has 20-30 mg of potassium  · Cranberries - ½ cup has 45 mg of potassium  · Cranberry juice cocktail - ½ cup has 20 mg of potassium  · Fats and oils - 1 Tbsp has less than 5 mg of potassium  · Hummus - 1 Tbsp has 32 mg of potassium  · Nectar (papaya, mango, or pear) - ½ cup has 35 mg of potassium  · Rice (white or brown) - ½ cup has 50 mg of potassium  · Spaghetti or macaroni (cooked) - ½ cup has 30 mg of potassium  · Tortilla, flour or corn - 1 has 50 mg of potassium  · Waffle - 1 four-inch waffle has 50 mg of potassium  · Water chestnuts - ½ cup has 40 mg of potassium  Summary  · Potassium is a mineral found in many foods and drinks. It affects how the heart works, and helps keep fluids and minerals balanced in the body.  · The amount of potassium you need each day depends on your age and any existing medical conditions you may have. Your health care provider or dietitian may recommend an amount of potassium that you should have each day.  This information is not intended to replace advice given to you by your health care provider. Make sure you discuss any questions you have with your health care  provider.  Document Released: 03/07/2005 Document Revised: 10/18/2016 Document Reviewed: 10/18/2016  [employer] Interactive Patient Education © 2019 [employer]

## 2019-01-04 NOTE — Discharge Summary (Addendum)
Discharge Summary  Brad [name]:[id number] DOB: July 11, 1956  PCP: Biagio Borg, MD  Admit date: 01/02/2019 Discharge date: 01/04/2019  Time spent: 35 minutes  Recommendations for Outpatient Follow-up:  1. Follow-up with oncology, keep your appointment on Monday 01/06/2019 at 3:15 PM. 2. Follow-up with your primary care provider 3. Follow-up with cardiology 4. Take your medications as prescribed  Discharge Diagnoses:  Active Hospital Problems   Diagnosis Date Noted   Weakness 01/02/2019   Hypokalemia 01/02/2019   Stage III squamous cell carcinoma of left lung ([facility]) 06/29/2017   Secondary cardiomyopathy ([facility]) 10/14/2015   Essential hypertension 02/16/2015   Hypothyroidism 08/27/2014   COPD (chronic obstructive pulmonary disease) ([facility]) 03/17/2010    Resolved Hospital Problems  No resolved problems to display.    Discharge Condition: Stable  Diet recommendation: Resume previous diet  Vitals:   01/03/19 2138 01/04/19 0433  BP: (!) 151/69 (!) 167/60  Pulse: 64 61  Resp: 18 18  Temp: 98.3 F (36.8 C) 97.6 F (36.4 C)  SpO2: 99% 97%    History of present illness:  Brad Mowrey Johnsonis a 63 y.o.malewithhistory of metastatic lung cancer, COPD, diabetes mellitus, CHF, anemia, history of hepatitis C being treated with Harvoni has been in the jail last 61 weeks recently admitted for weakness and CHF required thoracentesis presents with increasing weakness over the last 1 week. Patient states he has been feeling weak but denies any loss of consciousness chest pain shortness of breath or diarrhea or nausea vomiting. Denies any focal deficits. Has been compliant with his medications.  ED Course:In the ER labs revealed sodium of 135 potassium 2.7 creatinine 1.5 which is increased from 1.23 weeks ago. LFTs were showing alk phos of 163 albumin of 1.5. CBC shows hemoglobin of 7.6 which is decreased from 8. Platelets 382. X-ray shows stable left sided mass and  effusion. EKG shows normal sinus rhythm with ST depression in the inferolateral leads. Fluid bolus potassium replacement and admitted for generalized weakness. COVID-19 test was negative. Patient is afebrile.  Review of Systems: As per HPI, rest all negative.  01/03/19: Patient seen and examined at bedside.  Police officer present in the room.  Admits to persistent generalized weakness and intermittent cough.  Chest x-ray shows left upper lobe mass with left pleural effusion.  Patient denies following up with oncology however he has an appointment on 01/06/2019 with Dr. [name] after reviewing his medical records.  01/04/19: Patient seen and examined at his bedside.  Police officer present in the room.  Admits to intermittent nonproductive cough.  Patient advised cough is likely secondary to known lung cancer.  Discussed with Dr. [name] on 01/03/2019 who would like to see the patient in the clinic.  Scheduled for an appointment with oncologist on Monday, 01/06/2019 at 3:15 PM.  Patient made aware of this appointment and advised to follow-up.  On the day of discharge, the patient was hemodynamically stable.  Labs and vital signs reviewed and are stable.  Patient will need to follow-up with oncology as advised and take his medications as prescribed.  Hospital Course:  Active Problems:   COPD (chronic obstructive pulmonary disease) (HCC)   Hypothyroidism   Essential hypertension   Secondary cardiomyopathy (HCC)   Stage III squamous cell carcinoma of left lung (HCC)   Weakness   Hypokalemia  Generalized weakness likely multifactorial in the setting of metastatic left upper lobe squamous cell carcinoma secondary to hypokalemia and iron deficiency anemia Hypokalemia with potassium of 2.7 which was repleted  and normalized Hemoglobin 6.5 on presentation, was transfused 1 unit PRBC Hemoglobin on 03/06/2019 was 7.4. Follow-up with oncology on Monday, 01/06/2019 at 3:15 PM  Metastatic squamous cell  lung cancer Discussed with Dr. [name] who will see him outpatient  Exertional hypoxia Home O2 evaluation done on 01/04/2019 showed oxygen desaturation to 84% during ambulation.  At rest O2 saturation 94%. DME oxygen ordered prior to discharge.  Questionable left pleural effusion likely from malignancy Left thoracentesis attempt with no fluid O2 sat stable 97% on room air  Leukocytosis, resolved, likely reactive Presented with WBC 12.6 WBC resolved at 10.0 No clear signs of infection Afebrile  Hypothyroidism  TSH 9.3 Unclear if patient was compliant to his Synthroid Continue Synthroid Follow-up with your PCP  Resolved severe hypokalemia post repletion Presented with potassium 2.7 Magnesium 1.8 Repleted  Iron deficiency anemia/anemia of chronic disease with malignancy Hemoglobin 6.5 with MCV 79 Post 1 unit PRBC Repeated hemoglobin 7.4 No sign of overt bleeding Continue ferrous sulfate 325 3 times daily Follow-up with your PCP  Elevated troponin Likely demand ischemia from severe anemia Denies chest pain 0.08 on presentation, trended down  Hepatitis C  Follow-up with infectious disease  Chronic diastolic CHF Last 2D echo with preserved LVEF Follow-up with your cardiologist Dose of Lasix reduced from 80 mg daily to 40 mg daily Continue potassium supplement 20 mEq daily to avoid hypokalemia in the setting of diuresis    Code Status:DNR.  Consults called:  Discussed with Dr. [name], patient's oncologist on the phone on 01/03/2019.    Discharge Exam: BP (!) 167/60 (BP Location: Left Arm)    Pulse 61    Temp 97.6 F (36.4 C) (Oral)    Resp 18    Ht 5' 11"  (1.803 m)    Wt 84.9 kg    SpO2 97%    BMI 26.11 kg/m   General: 63 y.o. year-old male well developed well nourished in no acute distress.  Alert and interactive.  Cardiovascular: Regular rate and rhythm with no rubs or gallops.  No thyromegaly or JVD noted.    Respiratory: Mild rales at the  left bases.  Good inspiratory effort.    Abdomen: Soft nontender nondistended with normal bowel sounds x4 quadrants.  Musculoskeletal: No lower extremity edema. 2/4 pulses in all 4 extremities.  Psychiatry: Mood is appropriate for condition and setting  Discharge Instructions You were cared for by a hospitalist during your hospital stay. If you have any questions about your discharge medications or the care you received while you were in the hospital after you are discharged, you can call the unit and asked to speak with the hospitalist on call if the hospitalist that took care of you is not available. Once you are discharged, your primary care physician will handle any further medical issues. Please note that NO REFILLS for any discharge medications will be authorized once you are discharged, as it is imperative that you return to your primary care physician (or establish a relationship with a primary care physician if you do not have one) for your aftercare needs so that they can reassess your need for medications and monitor your lab values.   Allergies as of 01/04/2019      Reactions   Doxycycline Hives, Itching   Lipitor [atorvastatin] Other (See Comments)   Myalgia   Oxycodone Itching      Medication List    TAKE these medications   albuterol (2.5 MG/3ML) 0.083% nebulizer solution Commonly known as:  PROVENTIL Take  2.5 mg by nebulization 3 (three) times daily as needed for wheezing or shortness of breath.   amLODipine 10 MG tablet Commonly known as:  NORVASC Take 1 tablet (10 mg total) by mouth daily. Start taking on:  Jan 05, 2019 What changed:    medication strength  how much to take   aspirin EC 81 MG tablet Take 1 tablet (81 mg total) by mouth daily.   Blood Pressure Monitor Devi Use to check Blood pressure daily   ferrous sulfate 325 (65 FE) MG tablet Take 1 tablet (325 mg total) by mouth 3 (three) times daily with meals.   furosemide 40 MG tablet Commonly  known as:  LASIX Take 1 tablet (40 mg total) by mouth daily. Start taking on:  Jan 05, 2019 What changed:    medication strength  how much to take   glucose blood test strip Commonly known as:  Accu-Chek Aviva Plus Use to check blood sugars daily Dx E11.9   levothyroxine 112 MCG tablet Commonly known as:  SYNTHROID Take 1 tablet (112 mcg total) by mouth daily.   metFORMIN 500 MG tablet Commonly known as:  GLUCOPHAGE Take 500 mg by mouth 2 (two) times daily with a meal.   metoprolol succinate 25 MG 24 hr tablet Commonly known as:  TOPROL-XL Take 1 tablet (25 mg total) by mouth daily.   pantoprazole 40 MG tablet Commonly known as:  PROTONIX Take 1 tablet (40 mg total) by mouth daily.   potassium chloride SA 20 MEQ tablet Commonly known as:  K-DUR Take 1 tablet (20 mEq total) by mouth daily. Start taking on:  Jan 05, 2019   umeclidinium-vilanterol 62.5-25 MCG/INH Aepb Commonly known as:  Anoro Ellipta Inhale 1 puff into the lungs daily. Start taking on:  Jan 05, 2019            Durable Medical Equipment  (From admission, onward)         Start     Ordered   01/04/19 1305  For home use only DME oxygen  Once    Question Answer Comment  Length of Need 6 Months   Liters per Minute 2   Frequency Continuous (stationary and portable oxygen unit needed)   Oxygen conserving device Yes   Oxygen delivery system Gas      01/04/19 1305         Allergies  Allergen Reactions   Doxycycline Hives and Itching   Lipitor [Atorvastatin] Other (See Comments)    Myalgia    Oxycodone Itching   Follow-up Information    Biagio Borg, MD. Call in 1 day(s).   Specialties:  Internal Medicine, Radiology Why:  Please call for a post hospital follow-up appointment. Contact information: [place] [address] [phone]        Curt Bears, MD. Call in 1 day(s).   Specialty:  Oncology Why:  Please call for a post hospital follow-up  appointment.   You have an appointment with Dr. [name] on 01/06/2019 at 3:15 PM.  Please keep this appointment. Contact information: [place] [address] [phone]        Jerline Pain, MD. Call in 1 day(s).   Specialty:  Cardiology Why:  Call for a post hospital follow-up appointment. Contact information: [address] [place] [address] [phone]            The results of significant diagnostics from this hospitalization (including imaging, microbiology,  ancillary and laboratory) are listed below for reference.    Significant Diagnostic Studies: Dg Chest 1 View  Result Date: 01/03/2019 CLINICAL DATA:  Attempted left-sided thoraco centesis. EXAM: CHEST  1 VIEW COMPARISON:  Ultrasound 01/02/2009.  Chest x-ray 01/02/2019. FINDINGS: Surgical clip noted left chest. Unchanged pleural thickening on the left surgical clips noted over the left upper chest. No pneumothorax post centesis attempt. IMPRESSION: No pneumothorax post thoracentesis attempt. Electronically Signed   By: Marcello Moores  Register   On: 01/03/2019 16:43   Dg Chest 1 View  Result Date: 12/11/2018 CLINICAL DATA:  Status post left thoracentesis. EXAM: CHEST  1 VIEW COMPARISON:  PA and lateral chest 12/11/2018. FINDINGS: No pneumothorax is identified. Extensive opacity throughout the left chest appears worse than on the prior exam. The right lung is clear. Cardiac silhouette is obscured. IMPRESSION: Negative for pneumothorax after thoracentesis. Extensive opacity throughout the left chest appears worse than on the comparison study. Electronically Signed   By: Inge Rise M.D.   On: 12/11/2018 14:51   Dg Chest 1 View  Result Date: 12/06/2018 CLINICAL DATA:  Followup left thoracentesis EXAM: CHEST  1 VIEW COMPARISON:  12/05/2018 FINDINGS: Thoracentesis on the left by history. There continues to be extensive opacity within the left hemithorax. There is some aerated lung in  the lower chest. The degree of aeration of the lower lung is probably improved. There is no evidence of pneumothorax. Right chest remains clear. IMPRESSION: Some improvement in aeration of the left lower lung following left thoracentesis. No pneumothorax. Electronically Signed   By: Nelson Chimes M.D.   On: 12/06/2018 10:56   Dg Chest 2 View  Result Date: 12/11/2018 CLINICAL DATA:  Ongoing shortness of breath.  Recent thoracentesis EXAM: CHEST - 2 VIEW COMPARISON:  Five days ago FINDINGS: Extensive opacification and volume loss on the left. The right chest is comparatively clear with improved interstitial opacity since prior. Normal heart size which is displaced to the left. IMPRESSION: Unchanged extensive opacification and volume loss on the left where there is a necrotic lung mass. Electronically Signed   By: Monte Fantasia M.D.   On: 12/11/2018 08:50   Ct Head Wo Contrast  Result Date: 01/02/2019 CLINICAL DATA:  Dehydration and generalized weakness. History of metastatic lung cancer. EXAM: CT HEAD WITHOUT CONTRAST TECHNIQUE: Contiguous axial images were obtained from the base of the skull through the vertex without intravenous contrast. COMPARISON:  12/12/2018 FINDINGS: Brain: Generalized atrophy. Chronic small-vessel changes of the pons. No focal cerebellar finding. Old small vessel infarctions affecting the thalami and basal ganglia. Chronic small-vessel ischemic change of the cerebral hemispheric white matter. No sign of acute infarction, mass lesion, hemorrhage, hydrocephalus or extra-axial collection. Vascular: There is atherosclerotic calcification of the major vessels at the base of the brain. Skull: Negative Sinuses/Orbits: Clear/normal Other: None IMPRESSION: No change. No acute finding. No evidence of metastatic disease. Atrophy. Old lacunar infarctions of the thalami and basal ganglia and chronic small-vessel ischemic changes of the pons and cerebral hemispheric white matter. Electronically  Signed   By: Nelson Chimes M.D.   On: 01/02/2019 21:49   Ct Head Wo Contrast  Result Date: 12/12/2018 CLINICAL DATA:  Transient blurred vision. History of widespread metastatic lung cancer. EXAM: CT HEAD WITHOUT CONTRAST TECHNIQUE: Contiguous axial images were obtained from the base of the skull through the vertex without intravenous contrast. COMPARISON:  Brain MRI 06/23/2017 FINDINGS: Brain: There is no evidence of acute infarct, intracranial hemorrhage, mass, midline shift, or extra-axial fluid collection. Chronic  infarcts are again seen in the basal ganglia bilaterally, thalami, and cerebellum. There is mild cerebral atrophy. Patchy cerebral white matter hypodensities are nonspecific but compatible with mild-to-moderate chronic small vessel ischemic disease. There is no evidence of vasogenic edema. Vascular: Calcified atherosclerosis at the skull base. No hyperdense vessel. Skull: No fracture or focal osseous lesion. Sinuses/Orbits: Unremarkable orbits. Small left maxillary sinus mucous retention cyst. Chronic trace left mastoid effusion. Other: None. IMPRESSION: 1. No evidence of acute intracranial abnormality or metastatic disease on this unenhanced study. 2. Chronic ischemic changes as above. Electronically Signed   By: Logan Bores M.D.   On: 12/12/2018 15:57   Dg Chest Portable 1 View  Result Date: 01/02/2019 CLINICAL DATA:  Shortness of breath. History of squamous cell carcinoma of the lung. EXAM: PORTABLE CHEST 1 VIEW COMPARISON:  Multiple prior chest x-rays and chest CT 11/30/2018 FINDINGS: Stable large left apically lung mass and compressive left lower lobe atelectasis and left pleural effusion. The right lung is relatively clear. No obvious right lung lesions. Underlying emphysematous changes are noted. The bony thorax is intact. There is marked elevation of the right humeral head in the glenoid fossa likely due to a large rotator cuff tear. IMPRESSION: Stable large left AP cul lung mass and left  lower lobe atelectasis and effusion. Electronically Signed   By: Marijo Sanes M.D.   On: 01/02/2019 19:56   Ir US Chest  Result Date: 01/03/2019 INDICATION: Known metastatic lung cancer. Left pleural effusion. Request for diagnostic and therapeutic thoracentesis. EXAM: ULTRASOUND GUIDED LEFT THORACENTESIS MEDICATIONS: 1% lidocaine 8 mL COMPLICATIONS: None immediate. PROCEDURE: An ultrasound guided thoracentesis was thoroughly discussed with the patient and questions answered. The benefits, risks, alternatives and complications were also discussed. The patient understands and wishes to proceed with the procedure. Written consent was obtained. Ultrasound was performed to localize and mark a small pocket of fluid in the left chest. The fluid appeared loculated. The area was then prepped and draped in the normal sterile fashion. 1% Lidocaine was used for local anesthesia. Under ultrasound guidance a 6 Fr Safe-T-Centesis catheter was introduced. Thoracentesis was attempted, but unfortunately no fluid was able to be removed due to loculations. The catheter was removed and a dressing applied. FINDINGS: No fluid was removed secondary to loculated effusion. IMPRESSION: Attempted ultrasound guided left thoracentesis. No pneumothorax on post-procedure chest x-ray. Read by: Gareth Eagle, PA-C Electronically Signed   By: Markus Daft M.D.   On: 01/03/2019 16:47   Ir Thoracentesis Asp Pleural Space W/img Guide  Result Date: 12/11/2018 INDICATION: Patient with history of lung cancer (large centrally necrotic mass in LUL), dyspnea, and recurrent left pleural effusion. Request is made for therapeutic left thoracentesis. EXAM: ULTRASOUND GUIDED THERAPEUTIC LEFT THORACENTESIS MEDICATIONS: 10 mL 1% lidocaine COMPLICATIONS: None immediate. PROCEDURE: An ultrasound guided thoracentesis was thoroughly discussed with the patient and questions answered. The benefits, risks, alternatives and complications were also discussed. The  patient understands and wishes to proceed with the procedure. Written consent was obtained. Ultrasound was performed to localize and mark an adequate pocket of fluid in the left chest. The area was then prepped and draped in the normal sterile fashion. 1% Lidocaine was used for local anesthesia. Under ultrasound guidance a 6 Fr Safe-T-Centesis catheter was introduced. Thoracentesis was performed. The catheter was removed and a dressing applied. FINDINGS: A total of approximately 200 mL of clear gold fluid was removed. Procedure was stopped after 200 mL due to complexity of pleural effusion. IMPRESSION: Successful ultrasound guided left  thoracentesis yielding 200 mL of pleural fluid. Read by: Earley Abide, PA-C Electronically Signed   By: Jerilynn Mages.  Shick M.D.   On: 12/11/2018 14:56   Ir Thoracentesis Asp Pleural Space W/img Guide  Result Date: 12/06/2018 INDICATION: History of lung cancer. Recurrent left-sided pleural effusion. Request therapeutic thoracentesis. EXAM: ULTRASOUND GUIDED LEFT THORACENTESIS MEDICATIONS: None. COMPLICATIONS: None immediate. PROCEDURE: An ultrasound guided thoracentesis was thoroughly discussed with the patient and questions answered. The benefits, risks, alternatives and complications were also discussed. The patient understands and wishes to proceed with the procedure. Written consent was obtained. Ultrasound of the left chest demonstrates that the effusion has become severely loculated in the interval since previous procedure. Ultrasound was performed to localize and mark an adequate pocket of fluid in the left chest. The area was then prepped and draped in the normal sterile fashion. 1% Lidocaine was used for local anesthesia. Under ultrasound guidance a 6 Fr Safe-T-Centesis catheter was introduced. Thoracentesis was performed. The catheter was removed and a dressing applied. FINDINGS: A total of approximately 800 mL of clear, dark yellow fluid was removed. IMPRESSION: Successful  ultrasound guided left thoracentesis yielding 800 mL of pleural fluid. Left pleural effusion has become markedly loculated in the interval. Read by: Ascencion Dike PA-C Electronically Signed   By: Markus Daft M.D.   On: 12/06/2018 10:40    Microbiology: Recent Results (from the past 240 hour(s))  SARS Coronavirus 2 (CEPHEID - Performed in [place] hospital lab), Hosp Order     Status: None   Collection Time: 01/02/19  9:05 PM  Result Value Ref Range Status   SARS Coronavirus 2 NEGATIVE NEGATIVE Final    Comment: (NOTE) If result is NEGATIVE SARS-CoV-2 target nucleic acids are NOT DETECTED. The SARS-CoV-2 RNA is generally detectable in upper and lower  respiratory specimens during the acute phase of infection. The lowest  concentration of SARS-CoV-2 viral copies this assay can detect is 250  copies / mL. A negative result does not preclude SARS-CoV-2 infection  and should not be used as the sole basis for treatment or other  patient management decisions.  A negative result may occur with  improper specimen collection / handling, submission of specimen other  than nasopharyngeal swab, presence of viral mutation(s) within the  areas targeted by this assay, and inadequate number of viral copies  (<250 copies / mL). A negative result must be combined with clinical  observations, patient history, and epidemiological information. If result is POSITIVE SARS-CoV-2 target nucleic acids are DETECTED. The SARS-CoV-2 RNA is generally detectable in upper and lower  respiratory specimens dur ing the acute phase of infection.  Positive  results are indicative of active infection with SARS-CoV-2.  Clinical  correlation with patient history and other diagnostic information is  necessary to determine patient infection status.  Positive results do  not rule out bacterial infection or co-infection with other viruses. If result is PRESUMPTIVE POSTIVE SARS-CoV-2 nucleic acids MAY BE PRESENT.   A  presumptive positive result was obtained on the submitted specimen  and confirmed on repeat testing.  While 2019 novel coronavirus  (SARS-CoV-2) nucleic acids may be present in the submitted sample  additional confirmatory testing may be necessary for epidemiological  and / or clinical management purposes  to differentiate between  SARS-CoV-2 and other Sarbecovirus currently known to infect humans.  If clinically indicated additional testing with an alternate test  methodology [phone]) is advised. The SARS-CoV-2 RNA is generally  detectable in upper and lower respiratory sp ecimens  during the acute  phase of infection. The expected result is Negative. Fact Sheet for Patients:  StrictlyIdeas.no Fact Sheet for Healthcare Providers: BankingDealers.co.za This test is not yet approved or cleared by the [place] FDA and has been authorized for detection and/or diagnosis of SARS-CoV-2 by FDA under an Emergency Use Authorization (EUA).  This EUA will remain in effect (meaning this test can be used) for the duration of the COVID-19 declaration under Section 564(b)(1) of the Act, 21 U.S.C. section 360bbb-3(b)(1), unless the authorization is terminated or revoked sooner. Performed at [facility], [place] [address] [place], [place] [address]      Labs: Basic Metabolic Panel: Recent Labs  Lab 01/02/19 1818 01/02/19 1939 01/03/19 0203 01/03/19 1818 01/04/19 0441  NA 135  --  136 133* 134*  K 2.7*  --  2.8* 4.5 4.9  CL 95*  --  97* 99 99  CO2 27  --  27 25 27   GLUCOSE 147*  --  147* 145* 106*  BUN 36*  --  32* 29* 28*  CREATININE 1.56*  --  1.39* 1.33* 1.23  CALCIUM 8.1*  --  7.8* 7.8* 8.2*  MG  --  1.8  --   --   --    Liver Function Tests: Recent Labs  Lab 01/02/19 1909  AST 26  ALT 18  ALKPHOS 163*  BILITOT 0.6  PROT 7.0  ALBUMIN 1.5*   Recent Labs  Lab 01/02/19 1909  LIPASE 30   No results for input(s):  AMMONIA in the last 168 hours. CBC: Recent Labs  Lab 01/02/19 1818 01/03/19 0203 01/04/19 0632  WBC 12.6* 10.0  --   HGB 7.6* 6.5* 7.4*  HCT 25.7* 21.3* 23.9*  MCV 81.3 79.8*  --   PLT 382 300  --    Cardiac Enzymes: Recent Labs  Lab 01/03/19 0203 01/03/19 1818  CKTOTAL 19*  --   TROPONINI 0.08* 0.07*   BNP: BNP (last 3 results) Recent Labs    11/20/18 2034 11/30/18 0018 12/11/18 0818  BNP 1,440.6* 717.8* 1,993.5*    ProBNP (last 3 results) No results for input(s): PROBNP in the last 8760 hours.  CBG: No results for input(s): GLUCAP in the last 168 hours.     Signed:  Kayleen Memos, MD [facility] 01/04/2019, 1:06 PM

## 2019-01-06 ENCOUNTER — Telehealth: Payer: Self-pay | Admitting: *Deleted

## 2019-01-06 ENCOUNTER — Inpatient Hospital Stay: Payer: Medicare Other | Attending: Internal Medicine | Admitting: Internal Medicine

## 2019-01-06 LAB — ACID FAST CULTURE WITH REFLEXED SENSITIVITIES (MYCOBACTERIA): Acid Fast Culture: NEGATIVE

## 2019-01-06 NOTE — Telephone Encounter (Signed)
Pt was on TCM report admitted 01/02/19 for increased weakness. Pt has hx of Hep C being treated w/Harvoni. We are not able to do hosp f/u for pt he is incarcerated Per chart he has not seen MD since 2018 either.Marland KitchenJohny Chess

## 2019-02-05 DEATH — deceased

## 2019-06-01 IMAGING — CT CT HEAD WITHOUT CONTRAST
4 series · 15 of 47 positions shown, 17 images · non-contrast
Comparison: 12/12/2018

CLINICAL DATA: Dehydration and generalized weakness. History of
metastatic lung cancer.

EXAM:
CT HEAD WITHOUT CONTRAST
TECHNIQUE: Contiguous axial images were obtained from the base of the skull
through the vertex without intravenous contrast.

[Series 3: head without · axial · non-contrast · 0.39mm/px · z∈[-114,+6]mm · 7 of 34 slices shown, 9 images]
[im 5/34  brain]
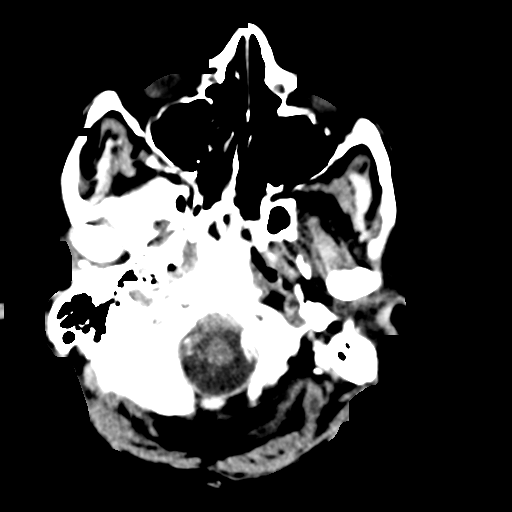
[im 5/34  bone]
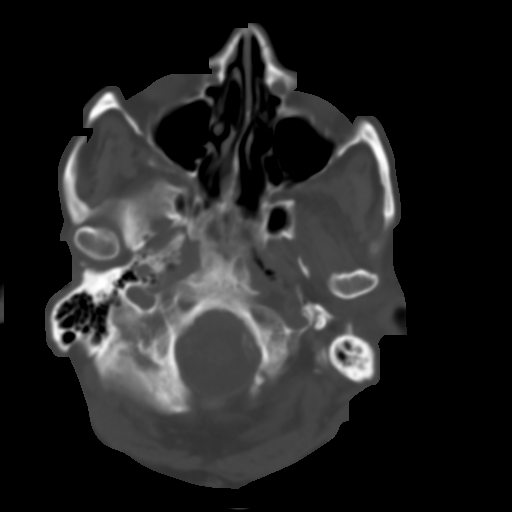
[im 9/34  brain]
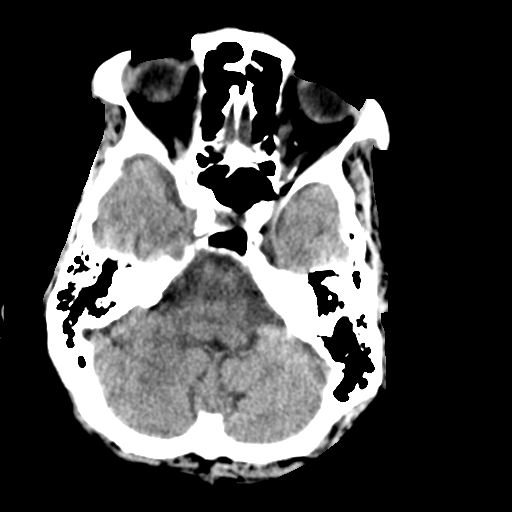
[im 13/34  brain]
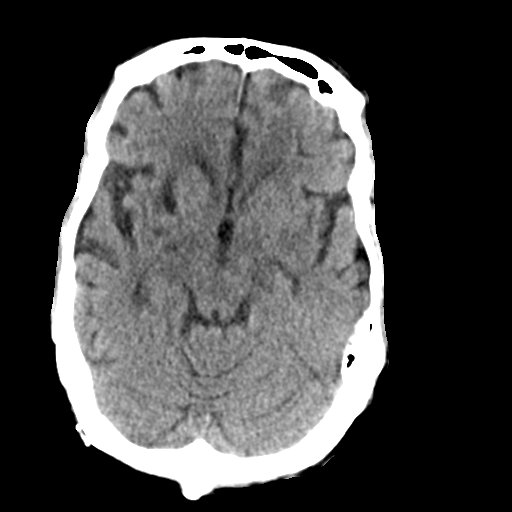
[im 17/34  brain]
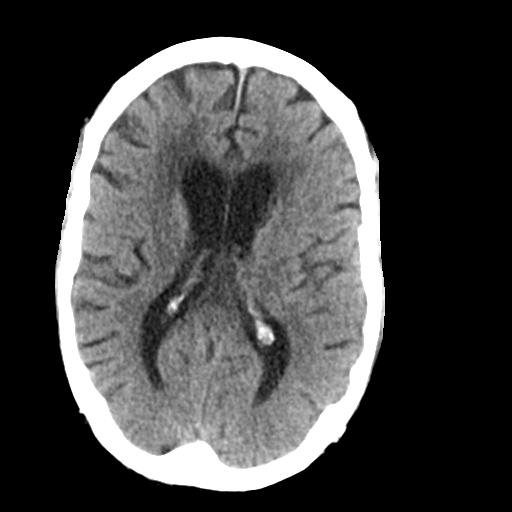
[im 21/34  brain]
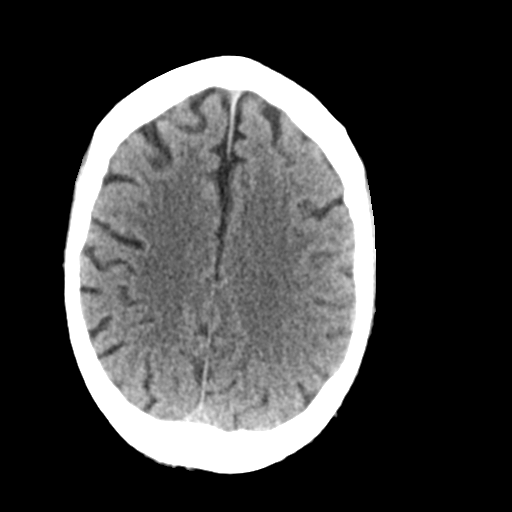
[im 21/34  bone]
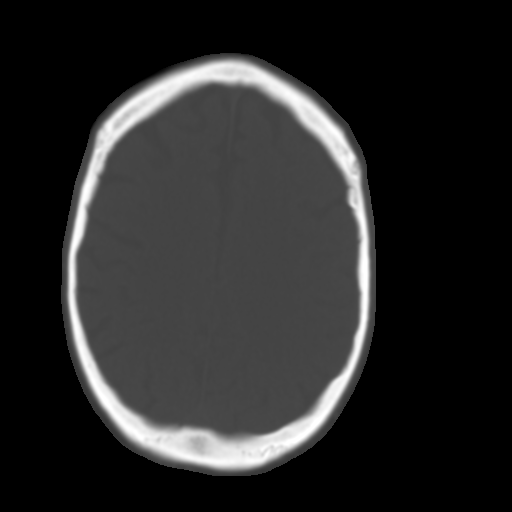
[im 25/34  brain]
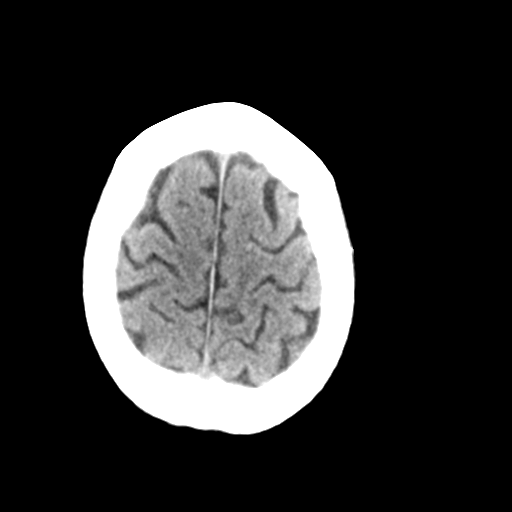
[im 29/34  brain]
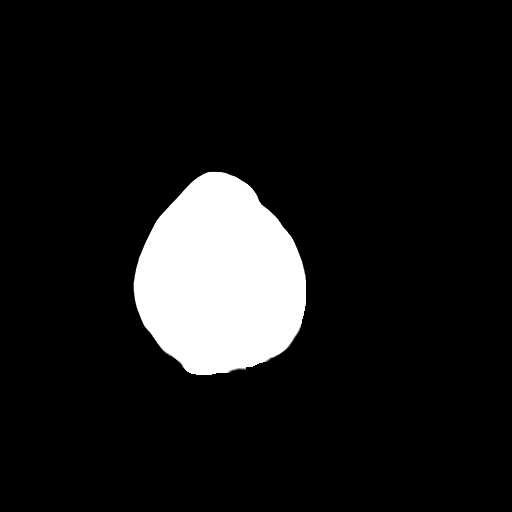

[Series 4: head bone · axial · 0.39mm/px · z∈[-118,-102]mm · 2 of 84 slices shown]
[im 9/84  bone]
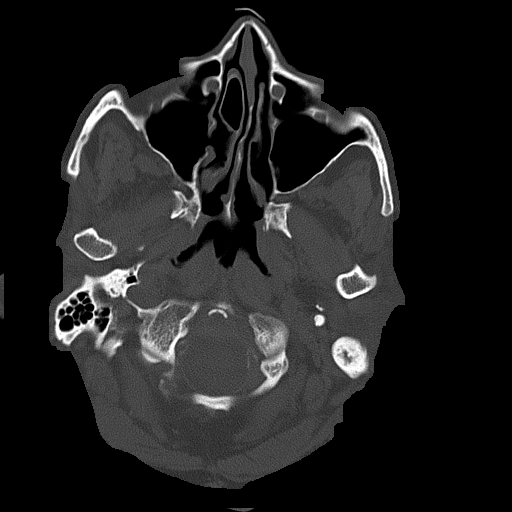
[im 17/84  bone]
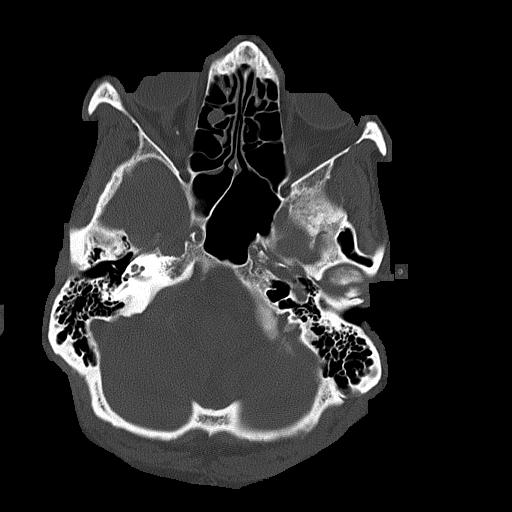

[Series 5: head without cor · coronal · non-contrast · 0.45mm/px · 3 of 67 slices shown]
[im 23/67  brain]
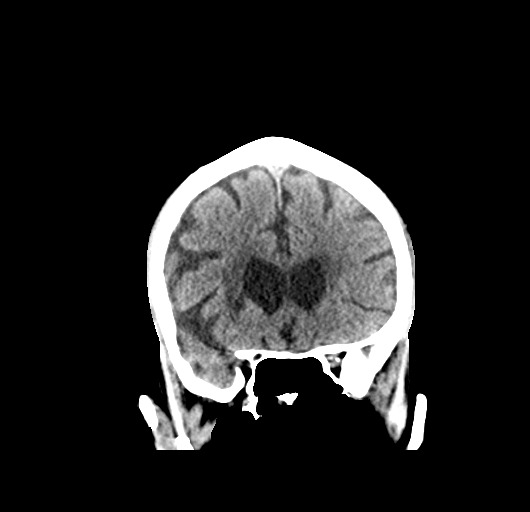
[im 30/67  brain]
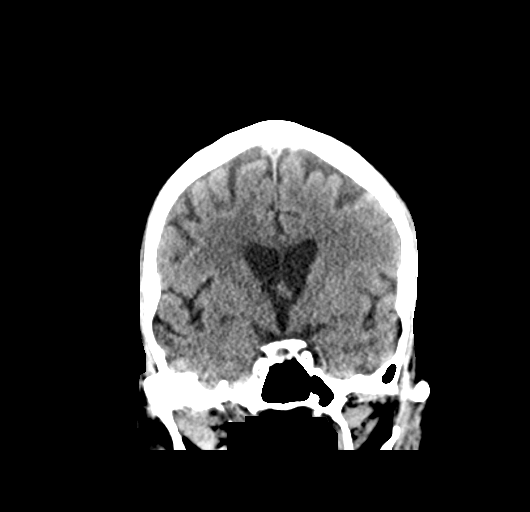
[im 37/67  brain]
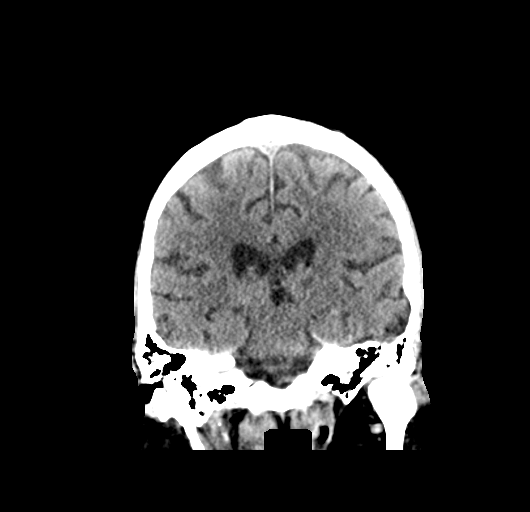

[Series 6: head without sag · sagittal · non-contrast · 0.33mm/px · 3 of 65 slices shown]
[im 22/65  brain]
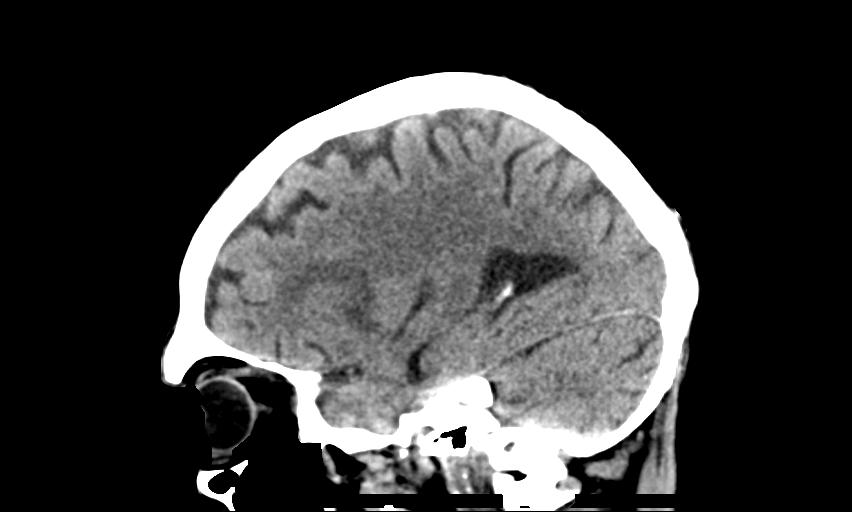
[im 33/65  brain]
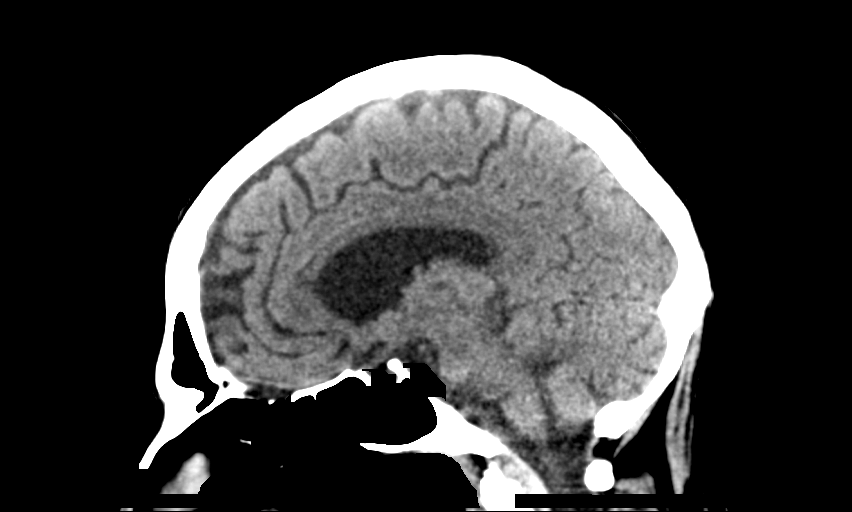
[im 43/65  brain]
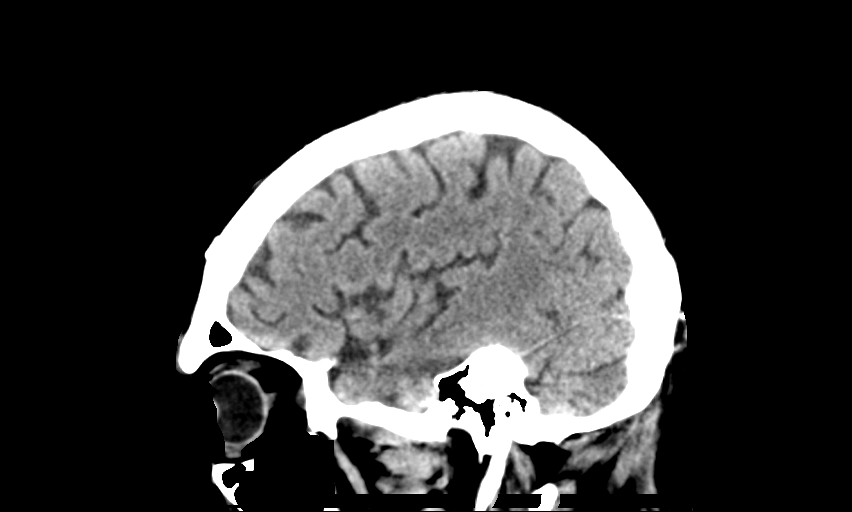

[15 of 47 positions shown; findings below may reference images not displayed]

FINDINGS: Brain: Generalized atrophy. Chronic small-vessel changes of the
pons. No focal cerebellar finding. Old small vessel infarctions
affecting the thalami and basal ganglia. Chronic small-vessel
ischemic change of the cerebral hemispheric white matter. No sign of
acute infarction, mass lesion, hemorrhage, hydrocephalus or
extra-axial collection.

Vascular: There is atherosclerotic calcification of the major
vessels at the base of the brain.

Skull: Negative

Sinuses/Orbits: Clear/normal

Other: None
IMPRESSION: No change. No acute finding. No evidence of metastatic disease.
Atrophy. Old lacunar infarctions of the thalami and basal ganglia
and chronic small-vessel ischemic changes of the pons and cerebral
hemispheric white matter.

## 2019-06-02 IMAGING — DX CHEST  1 VIEW
1 series · 1 of 1 positions shown · non-contrast
Comparison: Ultrasound 01/02/2009.  Chest x-ray 01/02/2019.

CLINICAL DATA: Attempted left-sided thoraco centesis.

EXAM:
CHEST  1 VIEW

[chest ap]
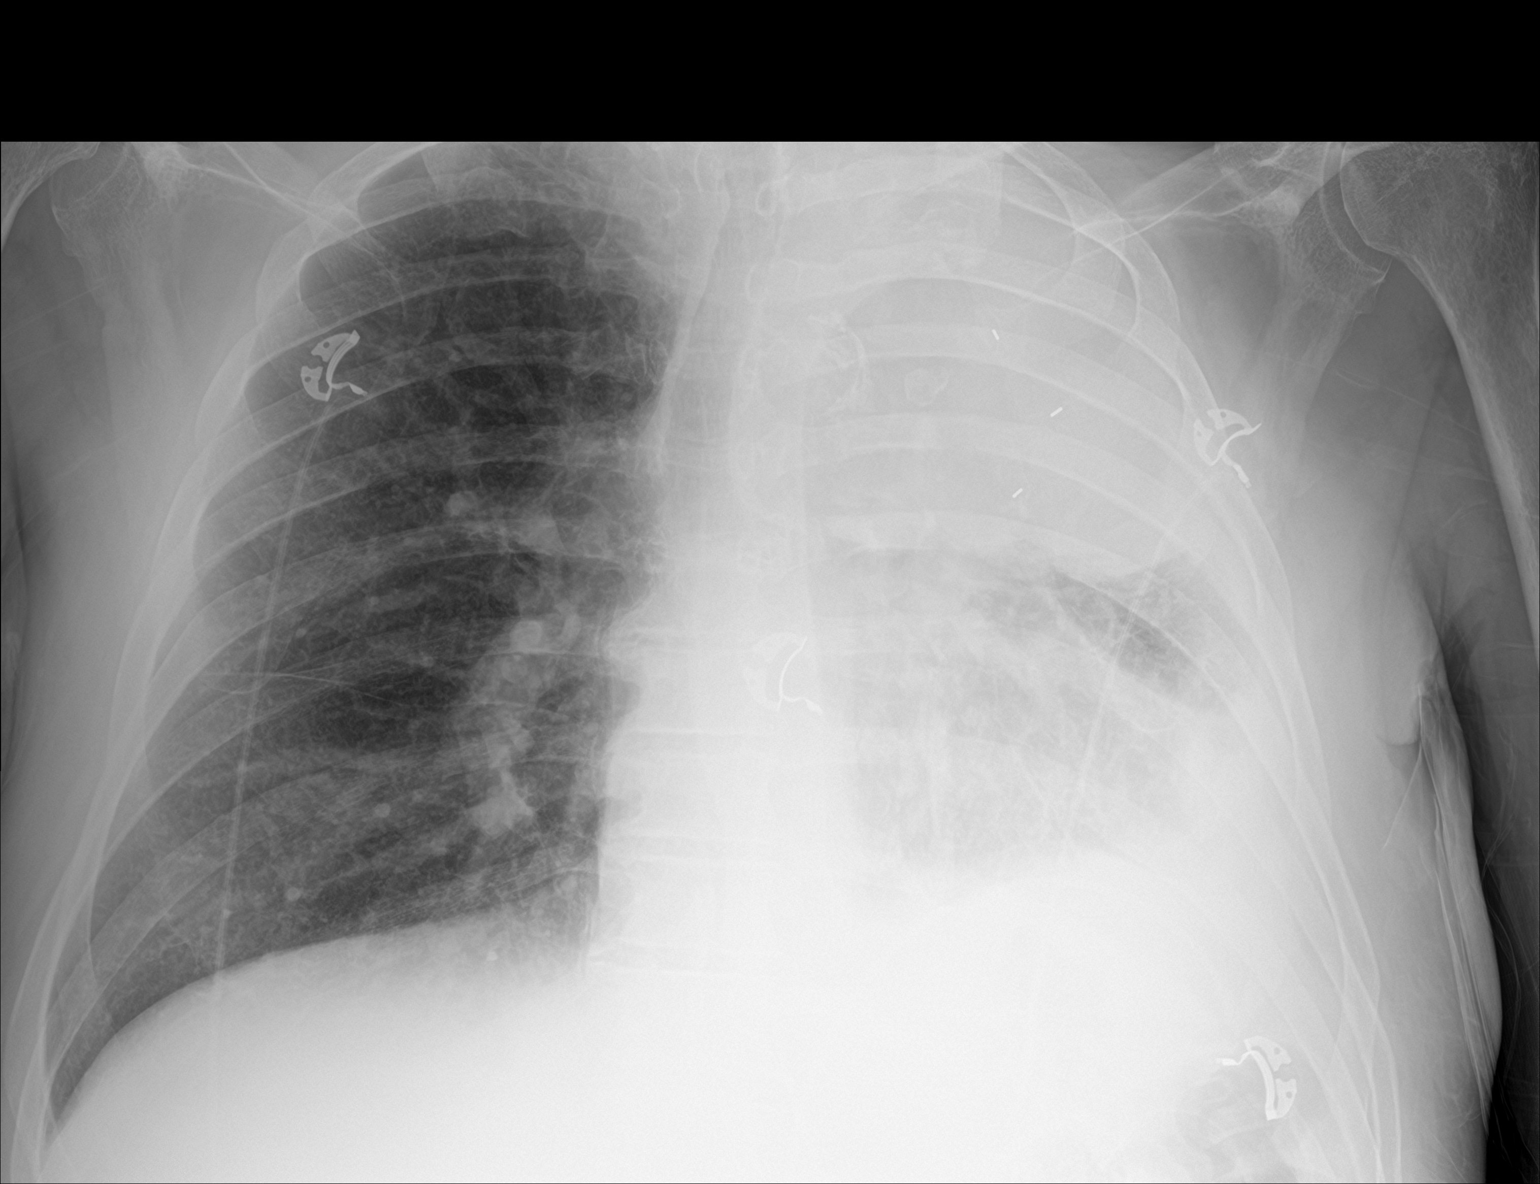

[1 of 1 positions shown; findings below may reference images not displayed]

FINDINGS: Surgical clip noted left chest. Unchanged pleural thickening on the
left surgical clips noted over the left upper chest. No pneumothorax
post centesis attempt.
IMPRESSION: No pneumothorax post thoracentesis attempt.

## 2021-10-14 NOTE — [facility] Nurse Program (Unsigned)
°  Dept: [phone]   [facility]regational Nurse Program Note  Date of Encounter: 12/18/2018  Past Medical History: Past Medical History:  Diagnosis Date   Anemia    takes Ferrous Sulfated daily   Arthritis    Carotid stenosis 08/27/2014   takes Aspirin daily   Chronic hepatitis C ([place]) 09/17/2015   COPD (chronic obstructive pulmonary disease) (HCC)    Albuterol daily as needed and Spiriva daily   Depression    Diabetes mellitus type II    takes Metformin and Glipizide daily   Dizziness    GERD (gastroesophageal reflux disease)    GI bleed 12/2015   Headache    occasionally   History of blood clots    in right leg;being followed by Dr.Brabham for this   History of blood transfusion 09/2014   2 units-no abnormal reaction noted   History of gastric ulcer    History of hiatal hernia    Hyperlipidemia 08/27/2014   takes Simvastatin daily   Hypertension    takes Amlodipine and Lisinopril daily   Hypothyroidism 08/27/2014   takes Synthroid daily   Insomnia    takes Pamelor nightly   Joint pain    hips   Muscle spasm    takes Zanaflex daily as needed   MVP (mitral valve prolapse)    Peripheral edema    takes Furosemide daily as needed   Peripheral neuropathy    Pleural effusion 11/2018   Prurigo nodularis    Stroke [facility])     Encounter Details:
